# Patient Record
Sex: Female | Born: 1940 | Race: Black or African American | Hispanic: No | Smoking: Former smoker
Health system: Southern US, Community
[De-identification: ages and names within clinical notes are randomized; demographics above are authoritative.]

## PROBLEM LIST (undated history)

## (undated) DIAGNOSIS — D649 Anemia, unspecified: Secondary | ICD-10-CM

## (undated) DIAGNOSIS — I10 Essential (primary) hypertension: Secondary | ICD-10-CM

## (undated) DIAGNOSIS — Z22322 Carrier or suspected carrier of Methicillin resistant Staphylococcus aureus: Secondary | ICD-10-CM

## (undated) DIAGNOSIS — G459 Transient cerebral ischemic attack, unspecified: Secondary | ICD-10-CM

## (undated) DIAGNOSIS — I5032 Chronic diastolic (congestive) heart failure: Secondary | ICD-10-CM

## (undated) DIAGNOSIS — N10 Acute tubulo-interstitial nephritis: Secondary | ICD-10-CM

## (undated) DIAGNOSIS — M86671 Other chronic osteomyelitis, right ankle and foot: Secondary | ICD-10-CM

## (undated) DIAGNOSIS — K5900 Constipation, unspecified: Secondary | ICD-10-CM

## (undated) DIAGNOSIS — N289 Disorder of kidney and ureter, unspecified: Secondary | ICD-10-CM

## (undated) DIAGNOSIS — N184 Chronic kidney disease, stage 4 (severe): Secondary | ICD-10-CM

## (undated) DIAGNOSIS — B370 Candidal stomatitis: Secondary | ICD-10-CM

## (undated) DIAGNOSIS — H547 Unspecified visual loss: Secondary | ICD-10-CM

## (undated) DIAGNOSIS — J189 Pneumonia, unspecified organism: Secondary | ICD-10-CM

## (undated) DIAGNOSIS — Z9089 Acquired absence of other organs: Secondary | ICD-10-CM

## (undated) DIAGNOSIS — R55 Syncope and collapse: Secondary | ICD-10-CM

## (undated) DIAGNOSIS — K219 Gastro-esophageal reflux disease without esophagitis: Secondary | ICD-10-CM

## (undated) HISTORY — DX: Syncope and collapse: R55

## (undated) HISTORY — DX: Acquired absence of other organs: Z90.89

## (undated) HISTORY — PX: ESOPHAGEAL DILATION: SHX303

## (undated) HISTORY — PX: GALLBLADDER SURGERY: SHX652

## (undated) HISTORY — PX: CHOLECYSTECTOMY: SHX55

## (undated) SURGERY — AMPUTATION, FOOT, RAY
Anesthesia: General | Laterality: Right

---

## 1997-08-19 ENCOUNTER — Emergency Department (HOSPITAL_COMMUNITY): Admission: EM | Admit: 1997-08-19 | Discharge: 1997-08-19 | Payer: Self-pay | Admitting: Emergency Medicine

## 1998-01-26 ENCOUNTER — Emergency Department (HOSPITAL_COMMUNITY): Admission: EM | Admit: 1998-01-26 | Discharge: 1998-01-27 | Payer: Self-pay | Admitting: Internal Medicine

## 1998-01-27 ENCOUNTER — Encounter: Payer: Self-pay | Admitting: Internal Medicine

## 1998-09-25 ENCOUNTER — Emergency Department (HOSPITAL_COMMUNITY): Admission: EM | Admit: 1998-09-25 | Discharge: 1998-09-25 | Payer: Self-pay

## 1998-09-28 ENCOUNTER — Encounter: Payer: Self-pay | Admitting: Emergency Medicine

## 1998-09-28 ENCOUNTER — Emergency Department (HOSPITAL_COMMUNITY): Admission: EM | Admit: 1998-09-28 | Discharge: 1998-09-28 | Payer: Self-pay | Admitting: Emergency Medicine

## 1998-09-30 ENCOUNTER — Encounter (HOSPITAL_BASED_OUTPATIENT_CLINIC_OR_DEPARTMENT_OTHER): Payer: Self-pay | Admitting: General Surgery

## 1998-10-04 ENCOUNTER — Encounter (HOSPITAL_BASED_OUTPATIENT_CLINIC_OR_DEPARTMENT_OTHER): Payer: Self-pay | Admitting: General Surgery

## 1998-10-05 ENCOUNTER — Inpatient Hospital Stay (HOSPITAL_COMMUNITY): Admission: RE | Admit: 1998-10-05 | Discharge: 1998-10-06 | Payer: Self-pay | Admitting: General Surgery

## 1998-10-11 ENCOUNTER — Encounter: Admission: RE | Admit: 1998-10-11 | Discharge: 1999-01-09 | Payer: Self-pay | Admitting: General Surgery

## 1999-02-10 ENCOUNTER — Encounter (INDEPENDENT_AMBULATORY_CARE_PROVIDER_SITE_OTHER): Payer: Self-pay

## 1999-02-10 ENCOUNTER — Encounter: Payer: Self-pay | Admitting: Emergency Medicine

## 1999-02-10 ENCOUNTER — Inpatient Hospital Stay (HOSPITAL_COMMUNITY): Admission: EM | Admit: 1999-02-10 | Discharge: 1999-02-15 | Payer: Self-pay | Admitting: Emergency Medicine

## 1999-03-21 ENCOUNTER — Ambulatory Visit (HOSPITAL_COMMUNITY): Admission: RE | Admit: 1999-03-21 | Discharge: 1999-03-21 | Payer: Self-pay | Admitting: Gastroenterology

## 1999-03-21 ENCOUNTER — Encounter: Payer: Self-pay | Admitting: Gastroenterology

## 2000-12-21 ENCOUNTER — Emergency Department (HOSPITAL_COMMUNITY): Admission: EM | Admit: 2000-12-21 | Discharge: 2000-12-21 | Payer: Self-pay | Admitting: Emergency Medicine

## 2000-12-23 ENCOUNTER — Ambulatory Visit (HOSPITAL_COMMUNITY): Admission: RE | Admit: 2000-12-23 | Discharge: 2000-12-24 | Payer: Self-pay | Admitting: Ophthalmology

## 2000-12-23 ENCOUNTER — Encounter: Payer: Self-pay | Admitting: Ophthalmology

## 2001-04-09 ENCOUNTER — Encounter: Payer: Self-pay | Admitting: Ophthalmology

## 2001-04-09 ENCOUNTER — Ambulatory Visit (HOSPITAL_COMMUNITY): Admission: RE | Admit: 2001-04-09 | Discharge: 2001-04-10 | Payer: Self-pay | Admitting: Ophthalmology

## 2001-05-19 ENCOUNTER — Ambulatory Visit (HOSPITAL_COMMUNITY): Admission: RE | Admit: 2001-05-19 | Discharge: 2001-05-20 | Payer: Self-pay | Admitting: Ophthalmology

## 2001-09-25 ENCOUNTER — Emergency Department (HOSPITAL_COMMUNITY): Admission: EM | Admit: 2001-09-25 | Discharge: 2001-09-25 | Payer: Self-pay | Admitting: Emergency Medicine

## 2001-09-25 ENCOUNTER — Encounter: Payer: Self-pay | Admitting: Emergency Medicine

## 2003-08-11 ENCOUNTER — Inpatient Hospital Stay (HOSPITAL_COMMUNITY): Admission: EM | Admit: 2003-08-11 | Discharge: 2003-08-14 | Payer: Self-pay | Admitting: *Deleted

## 2003-08-12 ENCOUNTER — Encounter: Payer: Self-pay | Admitting: Internal Medicine

## 2003-09-07 ENCOUNTER — Emergency Department (HOSPITAL_COMMUNITY): Admission: EM | Admit: 2003-09-07 | Discharge: 2003-09-07 | Payer: Self-pay | Admitting: *Deleted

## 2004-01-12 ENCOUNTER — Ambulatory Visit: Payer: Self-pay | Admitting: Family Medicine

## 2004-03-28 ENCOUNTER — Ambulatory Visit: Payer: Self-pay | Admitting: *Deleted

## 2004-03-28 ENCOUNTER — Ambulatory Visit: Payer: Self-pay | Admitting: Family Medicine

## 2006-12-16 ENCOUNTER — Emergency Department (HOSPITAL_COMMUNITY): Admission: EM | Admit: 2006-12-16 | Discharge: 2006-12-16 | Payer: Self-pay | Admitting: Emergency Medicine

## 2007-01-16 ENCOUNTER — Ambulatory Visit: Payer: Self-pay | Admitting: Family Medicine

## 2007-05-12 ENCOUNTER — Ambulatory Visit: Payer: Self-pay | Admitting: Family Medicine

## 2007-05-12 LAB — CONVERTED CEMR LAB
ALT: 10 units/L (ref 0–35)
Albumin: 4.1 g/dL (ref 3.5–5.2)
Basophils Absolute: 0 10*3/uL (ref 0.0–0.1)
Chloride: 101 meq/L (ref 96–112)
Creatinine, Ser: 1.04 mg/dL (ref 0.40–1.20)
Eosinophils Absolute: 0.2 10*3/uL (ref 0.0–0.7)
Free T4: 1.46 ng/dL (ref 0.89–1.80)
Glucose, Bld: 277 mg/dL — ABNORMAL HIGH (ref 70–99)
HCT: 41.9 % (ref 36.0–46.0)
HDL: 53 mg/dL (ref >39)
Hemoglobin: 13.4 g/dL (ref 12.0–15.0)
LDL Cholesterol: 215 mg/dL — ABNORMAL HIGH (ref 0–99)
Lymphs Abs: 1.7 10*3/uL (ref 0.7–4.0)
MCV: 88.2 fL (ref 78.0–100.0)
Monocytes Relative: 3 % (ref 3–12)
Neutro Abs: 7.2 10*3/uL (ref 1.7–7.7)
Potassium: 4.6 meq/L (ref 3.5–5.3)
RBC: 4.75 M/uL (ref 3.87–5.11)
RDW: 14.6 % (ref 11.5–15.5)
Total Bilirubin: 0.3 mg/dL (ref 0.3–1.2)
Total Protein: 8 g/dL (ref 6.0–8.3)
VLDL: 27 mg/dL (ref 0–40)

## 2007-05-13 ENCOUNTER — Encounter (INDEPENDENT_AMBULATORY_CARE_PROVIDER_SITE_OTHER): Payer: Self-pay | Admitting: Family Medicine

## 2007-06-03 ENCOUNTER — Ambulatory Visit: Payer: Self-pay | Admitting: Family Medicine

## 2007-06-18 ENCOUNTER — Ambulatory Visit: Payer: Self-pay | Admitting: Internal Medicine

## 2007-07-01 ENCOUNTER — Inpatient Hospital Stay (HOSPITAL_COMMUNITY): Admission: EM | Admit: 2007-07-01 | Discharge: 2007-07-05 | Payer: Self-pay | Admitting: Emergency Medicine

## 2007-08-14 ENCOUNTER — Ambulatory Visit: Payer: Self-pay | Admitting: Family Medicine

## 2007-08-14 LAB — CONVERTED CEMR LAB: Microalb, Ur: 5.44 mg/dL — ABNORMAL HIGH (ref 0.00–1.89)

## 2007-12-19 ENCOUNTER — Ambulatory Visit: Payer: Self-pay | Admitting: Internal Medicine

## 2008-11-04 ENCOUNTER — Ambulatory Visit: Payer: Self-pay | Admitting: Family Medicine

## 2008-11-04 LAB — CONVERTED CEMR LAB
CO2: 24 meq/L (ref 19–32)
Calcium: 9.4 mg/dL (ref 8.4–10.5)
Chloride: 103 meq/L (ref 96–112)
LDL Cholesterol: 185 mg/dL — ABNORMAL HIGH (ref 0–99)
Potassium: 4.7 meq/L (ref 3.5–5.3)
Sodium: 141 meq/L (ref 135–145)
Total CHOL/HDL Ratio: 4.7
VLDL: 21 mg/dL (ref 0–40)
Vit D, 25-Hydroxy: 9 ng/mL — ABNORMAL LOW (ref 30–89)

## 2008-11-05 ENCOUNTER — Encounter (INDEPENDENT_AMBULATORY_CARE_PROVIDER_SITE_OTHER): Payer: Self-pay | Admitting: Family Medicine

## 2008-12-25 ENCOUNTER — Encounter (INDEPENDENT_AMBULATORY_CARE_PROVIDER_SITE_OTHER): Payer: Self-pay | Admitting: Internal Medicine

## 2008-12-25 ENCOUNTER — Inpatient Hospital Stay (HOSPITAL_COMMUNITY): Admission: EM | Admit: 2008-12-25 | Discharge: 2008-12-26 | Payer: Self-pay | Admitting: Emergency Medicine

## 2009-03-06 ENCOUNTER — Emergency Department (HOSPITAL_COMMUNITY): Admission: EM | Admit: 2009-03-06 | Discharge: 2009-03-06 | Payer: Self-pay | Admitting: Emergency Medicine

## 2009-03-10 ENCOUNTER — Ambulatory Visit: Payer: Self-pay | Admitting: Family Medicine

## 2009-05-09 ENCOUNTER — Emergency Department (HOSPITAL_COMMUNITY): Admission: EM | Admit: 2009-05-09 | Discharge: 2009-05-09 | Payer: Self-pay | Admitting: Emergency Medicine

## 2009-05-17 ENCOUNTER — Observation Stay (HOSPITAL_COMMUNITY): Admission: EM | Admit: 2009-05-17 | Discharge: 2009-05-19 | Payer: Self-pay | Admitting: Emergency Medicine

## 2009-05-17 ENCOUNTER — Encounter (INDEPENDENT_AMBULATORY_CARE_PROVIDER_SITE_OTHER): Payer: Self-pay | Admitting: Internal Medicine

## 2009-05-17 ENCOUNTER — Ambulatory Visit: Payer: Self-pay | Admitting: Internal Medicine

## 2009-05-17 ENCOUNTER — Ambulatory Visit: Payer: Self-pay | Admitting: Cardiology

## 2009-05-18 ENCOUNTER — Encounter: Payer: Self-pay | Admitting: Internal Medicine

## 2009-05-18 ENCOUNTER — Ambulatory Visit: Payer: Self-pay | Admitting: Vascular Surgery

## 2009-05-19 ENCOUNTER — Encounter: Payer: Self-pay | Admitting: Internal Medicine

## 2009-05-19 DIAGNOSIS — I452 Bifascicular block: Secondary | ICD-10-CM

## 2009-05-19 DIAGNOSIS — R55 Syncope and collapse: Secondary | ICD-10-CM

## 2009-05-19 DIAGNOSIS — Z9089 Acquired absence of other organs: Secondary | ICD-10-CM | POA: Insufficient documentation

## 2009-05-19 DIAGNOSIS — I6350 Cerebral infarction due to unspecified occlusion or stenosis of unspecified cerebral artery: Secondary | ICD-10-CM | POA: Insufficient documentation

## 2009-05-19 DIAGNOSIS — I10 Essential (primary) hypertension: Secondary | ICD-10-CM

## 2009-05-19 DIAGNOSIS — H547 Unspecified visual loss: Secondary | ICD-10-CM | POA: Insufficient documentation

## 2009-05-19 DIAGNOSIS — K220 Achalasia of cardia: Secondary | ICD-10-CM

## 2009-05-19 DIAGNOSIS — E1151 Type 2 diabetes mellitus with diabetic peripheral angiopathy without gangrene: Secondary | ICD-10-CM

## 2009-05-19 DIAGNOSIS — N3289 Other specified disorders of bladder: Secondary | ICD-10-CM | POA: Insufficient documentation

## 2009-05-19 HISTORY — DX: Acquired absence of other organs: Z90.89

## 2009-05-19 HISTORY — DX: Syncope and collapse: R55

## 2009-05-20 ENCOUNTER — Encounter: Payer: Self-pay | Admitting: Internal Medicine

## 2009-06-16 ENCOUNTER — Ambulatory Visit: Payer: Self-pay | Admitting: Family Medicine

## 2010-02-28 NOTE — Discharge Summary (Signed)
Summary: Hospital Discharge Update (syncope)    Hospital Discharge Update:  Date of Admission: 05/17/2009 Date of Discharge: 05/19/2009  Brief Summary:  Syncope- cause undetermined. 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 Follow up on that,  Problem list changes:  Added new problem of SYNCOPE (ICD-780.2) Added new problem of DM (ICD-250.00) Added new problem of BLINDNESS, BILATERAL (ICD-369.00) Added new problem of ESSENTIAL HYPERTENSION (ICD-401.9) Added new problem of LACUNAR INFARCTION (ICD-434.91) Added new problem of ACHALASIA (ICD-530.0) Added new problem of CHOLECYSTECTOMY, HX OF (ICD-V45.79) Added new problem of IRRITABLE BLADDER (ICD-596.8) Added new problem of OTHER BILATERAL BUNDLE BRANCH BLOCK (ICD-426.53)  Medication list changes:  Added new medication of LISINOPRIL 10 MG TABS (LISINOPRIL) Take 1 tablet by mouth once a day Added new medication of GLIPIZIDE 10 MG TABS (GLIPIZIDE) Take 1 tablet by mouth once a day Added new medication of SIMVASTATIN 40 MG TABS (SIMVASTATIN) 1 tab by mouth at bedtime Added new medication of ENABLEX 7.5 MG XR24H-TAB (DARIFENACIN HYDROBROMIDE) Take 1 tablet by mouth once a day Added new medication of VITAMIN D (ERGOCALCIFEROL) 50000 UNIT CAPS (ERGOCALCIFEROL) 1 tab on Mondays  The medication, problem, and allergy lists have been updated.  Please see the dictated discharge summary for details.  Discharge medications:  LISINOPRIL 10 MG TABS (LISINOPRIL) Take 1 tablet by mouth once a day GLIPIZIDE 10 MG TABS (GLIPIZIDE) Take 1 tablet by mouth once a day SIMVASTATIN 40 MG TABS (SIMVASTATIN) 1  tab by mouth at bedtime ENABLEX 7.5 MG XR24H-TAB (DARIFENACIN HYDROBROMIDE) Take 1 tablet by mouth once a day VITAMIN D (ERGOCALCIFEROL) 50000 UNIT CAPS (ERGOCALCIFEROL) 1 tab on Mondays  Other patient instructions:  PICK UP YOUR HEART MONITOR FROM THE CARDIOLOGIST's OFFICE AFTER 2 WEEKS Their address is [address], [address], [place], [place] and phone number [phone] Stop taking hydrochlorthiazide Dose of lisinopril is reduced and zocor is increased. Refill new prescriptions from pharmacy Schedule follow up appointment with [facility] in 2 weeks Come to the ED if you are light headed or have a fall    Note: Hospital Discharge Medications & Other Instructions handout was printed, one copy for patient and a second copy to be placed in hospital chart.

## 2010-02-28 NOTE — Miscellaneous (Signed)
Summary: adding aspirin to med list  Clinical Lists Changes  Medications: Added new medication of ASPIRIN 325 MG TABS (ASPIRIN) Take 1 tablet by mouth once a day

## 2010-02-28 NOTE — Assessment & Plan Note (Signed)
Summary: Hospital Admission H&P  INTERNAL MEDICINE TEACHING SERVICE ADMISSION HISTORY & PHYSICAL   Attending: Dr. Tilford Pillar First contact: Dr. Scot Dock [phone]  Second contact: Dr. Comer Locket [phone] [facility], after-hours: [phone], [phone])     PCP: [facility]   CC: Fall   HPI: [name] is a 70 yo woman with DM, complicated by retinopathy and legally blind, lives in ALF and uses cane for mobility. She was walking from her bed to the bathroom this morning when she fell down right outside the bathroom door. She denies having tripped over, denies any premonitory aura, chest pain, palpitations. Denies seizures or loss of consciousness. She says she just fell down and hurt her R LE. Apart from pain in her R LE she denies any other weakness, numbness, headaches, speech/swallowing difficulty. No fever, cough, dysuria/ NVD or any other systemic complaints.   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 daily.  Hydrochlorothiazide 25 mg p.o. daily.  Lisinopril 20 mg p.o. daily.  Darifenacin 7.5 mg 1 p.o. daily.  Vitamin D 50,000 units taken on Mondays.  Simvastatin 20 mg p.o. q.h.s.  Detrol LA 2 mg p.o. daily.     Social History: Negative for alcohol or tobacco use.  She lives in ALF. Walks with a cane.  Family History: Non-contributory  Review of System: Off and on urinary complaints. Recently seen for R flank pain, which she still has. But denies any urinary complaints. Rest per HPI.   Vital Signs: BP-142/57 HR-78   RR-20   Temp-97.5   Sat-98%/ra  Orthostatic Vitals: Lying: 156/63   74 Sitting: 174/68   78 Standing: 167/81   82  Physical Exam: GEN- Not in distress HEENT- NCAT, PERRL, no icterus/pallor LUNGS- clear to auscultation.   CVS- RRR, no murmur ABD- soft, non-tender, normal bowel  sounds.   EXT- no cyanosis, clubbing or edema.  NEURO- A&Ox3. CN intact except for CN II (blind). Normal motor strength, normal sensation.  PSY- appropriate.    LAB RESULTS  EKG: RBBB (old), New T wave changes in lateral leads.   TCO2                                     26                0-100              Ionized Calcium                          1.07       l      1.12-1.32          Hemoglobin (HGB)                         13.9              12.0-15.0          Hematocrit (HCT)                         41.0              36.0-46.0        %  Sodium (NA)                              139               135-145            Potassium (K)                            4.4               3.5-5.1            Chloride                                 106               96-112             Glucose                                  197        h      70-99              BUN                                      19                6-23               Creatinine                               0.9               0.4-1.2               WBC                                      8.6               4.0-10.5           RBC                                      4.29              3.87-5.11          Hemoglobin (HGB)                         12.4              12.0-15.0          Hematocrit (HCT)                         37.7              36.0-46.0        %  MCV  87.8              78.0-100.0         MCHC                                     33.1              30.0-36.0          RDW                                      14.9              11.5-15.5        %  Platelet Count (PLT)                     216               150-400            Neutrophils, %                           78         h      43-77            %  Lymphocytes, %                           16                12-46            %  Monocytes, %                             2          l      3-12             %  Eosinophils, %                            4                 0-5              %  Basophils, %                             0                 0-1              %  Neutrophils, Absolute                    6.8               1.7-7.7            Lymphocytes, Absolute                    1.4               0.7-4.0            Monocytes, Absolute  0.2               0.1-1.0            Eosinophils, Absolute                    0.3               0.0-0.7            Basophils, Absolute                      0.0               0.0-0.1             Sodium (NA)                              139               135-145            Potassium (K)                            4.6               3.5-5.1            Chloride                                 106               96-112             CO2                                      26                19-32              Glucose                                  189        h      70-99              BUN                                      18                6-23               Creatinine                               0.95              0.4-1.2            GFR, Est Non African American            59         l      >60  GFR, Est African American                >60               >60                  Oversized comment, see footnote  1  Calcium                                  9.0               8.4-10.5            Color, Urine                             YELLOW            YELLOW  Appearance                               CLEAR             CLEAR  Specific Gravity                         1.015             1.005-1.030  pH                                       5.5               5.0-8.0  Urine Glucose                            NEGATIVE          NEG                Bilirubin                                NEGATIVE          NEG  Ketones                                  NEGATIVE          NEG                Blood                                    TRACE      a      NEG  Protein                                   100        a      NEG                Urobilinogen  0.2               0.0-1.0            Nitrite                                  NEGATIVE          NEG  Leukocytes                               NEGATIVE          NEG  Squamous Epithelial / LPF                FEW        a      RARE  WBC / HPF                                0-2               <3               WBC/hpf  RBC / HPF                                0-2               <3               RBC/hpf  Bacteria / HPF                           MANY       a      RARE  CKMB, POC                                1.9               1.0-8.0            Troponin I, POC                          <0.05             0.00-0.09          Myoglobin, POC                           92.6              12-200             ASSESSMENT & PLAN  This is a 70 yo woman admitted after an episode of fall without loss of consciousness. The differential is broad (incl postural hypo, hypoglycemia, cardiac events, sz, UTI) but given her blindness her fall might as well be related to that. Her orthostatic vitals look normal. Non focal on exam, unlikely to be CVA, won't get a CT head at this time. Although suspicious of UTI, her urine exam is not convincing. She does have significant risk factors for cardiac event, and given EKG changes we will admit her to rule out ACS.   ATTENDING PHYSICIAN: I performed and/or observed a history  and physical examination of the patient.  I discussed the case with the residents as noted and reviewed the residents' notes.  I agree with the findings and plan--please refer to the attending physician note for more details.  Signature________________________________  Printed Name_____________________________

## 2010-04-18 LAB — GLUCOSE, CAPILLARY
Glucose-Capillary: 181 mg/dL — ABNORMAL HIGH (ref 70–99)
Glucose-Capillary: 200 mg/dL — ABNORMAL HIGH (ref 70–99)
Glucose-Capillary: 237 mg/dL — ABNORMAL HIGH (ref 70–99)
Glucose-Capillary: 264 mg/dL — ABNORMAL HIGH (ref 70–99)

## 2010-04-18 LAB — URINALYSIS, ROUTINE W REFLEX MICROSCOPIC
Leukocytes, UA: NEGATIVE
Specific Gravity, Urine: 1.015 (ref 1.005–1.030)
Urobilinogen, UA: 0.2 mg/dL (ref 0.0–1.0)
pH: 5.5 (ref 5.0–8.0)

## 2010-04-18 LAB — POCT I-STAT, CHEM 8
BUN: 19 mg/dL (ref 6–23)
Calcium, Ion: 1.07 mmol/L — ABNORMAL LOW (ref 1.12–1.32)
Creatinine, Ser: 0.9 mg/dL (ref 0.4–1.2)
Glucose, Bld: 197 mg/dL — ABNORMAL HIGH (ref 70–99)
HCT: 41 % (ref 36.0–46.0)
Hemoglobin: 13.9 g/dL (ref 12.0–15.0)
TCO2: 26 mmol/L (ref 0–100)

## 2010-04-18 LAB — CARDIAC PANEL(CRET KIN+CKTOT+MB+TROPI)
CK, MB: 1.7 ng/mL (ref 0.3–4.0)
CK, MB: 1.8 ng/mL (ref 0.3–4.0)
Relative Index: INVALID (ref 0.0–2.5)
Relative Index: INVALID (ref 0.0–2.5)
Total CK: 32 U/L (ref 7–177)
Total CK: 39 U/L (ref 7–177)
Troponin I: 0.06 ng/mL (ref 0.00–0.06)
Troponin I: 0.09 ng/mL — ABNORMAL HIGH (ref 0.00–0.06)

## 2010-04-18 LAB — DIFFERENTIAL
Eosinophils Absolute: 0.3 10*3/uL (ref 0.0–0.7)
Eosinophils Relative: 4 % (ref 0–5)
Lymphs Abs: 1.4 10*3/uL (ref 0.7–4.0)
Monocytes Absolute: 0.2 10*3/uL (ref 0.1–1.0)
Neutro Abs: 6.8 10*3/uL (ref 1.7–7.7)
Neutrophils Relative %: 78 % — ABNORMAL HIGH (ref 43–77)

## 2010-04-18 LAB — LIPID PANEL
Cholesterol: 226 mg/dL — ABNORMAL HIGH (ref 0–200)
HDL: 47 mg/dL (ref >39)

## 2010-04-18 LAB — CBC
HCT: 37.7 % (ref 36.0–46.0)
HCT: 38.7 % (ref 36.0–46.0)
MCV: 87.8 fL (ref 78.0–100.0)
Platelets: 203 10*3/uL (ref 150–400)
Platelets: 216 10*3/uL (ref 150–400)
RBC: 4.29 MIL/uL (ref 3.87–5.11)
RDW: 14.6 % (ref 11.5–15.5)
RDW: 14.9 % (ref 11.5–15.5)
WBC: 7.4 10*3/uL (ref 4.0–10.5)
WBC: 8.6 10*3/uL (ref 4.0–10.5)

## 2010-04-18 LAB — URINE MICROSCOPIC-ADD ON

## 2010-04-18 LAB — BASIC METABOLIC PANEL
BUN: 18 mg/dL (ref 6–23)
GFR calc Af Amer: >60 mL/min (ref >60)
GFR calc non Af Amer: 59 mL/min — ABNORMAL LOW (ref >60)
Potassium: 4.6 mEq/L (ref 3.5–5.1)

## 2010-04-18 LAB — CK TOTAL AND CKMB (NOT AT ARMC)
CK, MB: 1.9 ng/mL (ref 0.3–4.0)
Relative Index: INVALID (ref 0.0–2.5)
Total CK: 37 U/L (ref 7–177)

## 2010-04-18 LAB — URINE CULTURE: Colony Count: 30000

## 2010-04-18 LAB — TROPONIN I: Troponin I: 0.05 ng/mL (ref 0.00–0.06)

## 2010-04-18 LAB — POCT CARDIAC MARKERS: Myoglobin, poc: 92.6 ng/mL (ref 12–200)

## 2010-04-19 LAB — CBC
Hemoglobin: 12.7 g/dL (ref 12.0–15.0)
MCHC: 32.2 g/dL (ref 30.0–36.0)
MCV: 88.2 fL (ref 78.0–100.0)
RBC: 4.48 MIL/uL (ref 3.87–5.11)
RDW: 14.5 % (ref 11.5–15.5)

## 2010-04-19 LAB — URINALYSIS, ROUTINE W REFLEX MICROSCOPIC
Glucose, UA: 250 mg/dL — AB
Ketones, ur: NEGATIVE mg/dL
Leukocytes, UA: NEGATIVE
Protein, ur: 30 mg/dL — AB
Urobilinogen, UA: 0.2 mg/dL (ref 0.0–1.0)

## 2010-04-19 LAB — BASIC METABOLIC PANEL
CO2: 30 mEq/L (ref 19–32)
Chloride: 102 mEq/L (ref 96–112)
Creatinine, Ser: 0.96 mg/dL (ref 0.4–1.2)
GFR calc Af Amer: >60 mL/min (ref >60)
Glucose, Bld: 319 mg/dL — ABNORMAL HIGH (ref 70–99)
Sodium: 138 mEq/L (ref 135–145)

## 2010-04-19 LAB — DIFFERENTIAL
Basophils Relative: 0 % (ref 0–1)
Eosinophils Absolute: 0.1 10*3/uL (ref 0.0–0.7)
Eosinophils Relative: 2 % (ref 0–5)
Monocytes Absolute: 0.3 10*3/uL (ref 0.1–1.0)
Monocytes Relative: 4 % (ref 3–12)

## 2010-04-21 LAB — COMPREHENSIVE METABOLIC PANEL
ALT: 28 U/L (ref 0–35)
Alkaline Phosphatase: 72 U/L (ref 39–117)
Glucose, Bld: 234 mg/dL — ABNORMAL HIGH (ref 70–99)
Potassium: 4.8 mEq/L (ref 3.5–5.1)
Sodium: 130 mEq/L — ABNORMAL LOW (ref 135–145)
Total Protein: 7.5 g/dL (ref 6.0–8.3)

## 2010-04-21 LAB — CBC
Hemoglobin: 13.2 g/dL (ref 12.0–15.0)
RBC: 4.63 MIL/uL (ref 3.87–5.11)
RDW: 15 % (ref 11.5–15.5)
WBC: 11.5 10*3/uL — ABNORMAL HIGH (ref 4.0–10.5)

## 2010-04-21 LAB — URINALYSIS, ROUTINE W REFLEX MICROSCOPIC
Bilirubin Urine: NEGATIVE
Glucose, UA: NEGATIVE mg/dL
Hgb urine dipstick: NEGATIVE
Ketones, ur: NEGATIVE mg/dL
Protein, ur: NEGATIVE mg/dL

## 2010-04-21 LAB — DIFFERENTIAL
Basophils Relative: 0 % (ref 0–1)
Eosinophils Absolute: 0 10*3/uL (ref 0.0–0.7)
Monocytes Absolute: 0.2 10*3/uL (ref 0.1–1.0)
Monocytes Relative: 2 % — ABNORMAL LOW (ref 3–12)
Neutrophils Relative %: 93 % — ABNORMAL HIGH (ref 43–77)

## 2010-04-21 LAB — URINE CULTURE: Culture: NO GROWTH

## 2010-05-03 LAB — URINE CULTURE
Colony Count: NO GROWTH
Colony Count: NO GROWTH
Culture: NO GROWTH
Culture: NO GROWTH

## 2010-05-03 LAB — DIFFERENTIAL
Basophils Relative: 0 % (ref 0–1)
Basophils Relative: 0 % (ref 0–1)
Eosinophils Absolute: 0.3 10*3/uL (ref 0.0–0.7)
Eosinophils Relative: 3 % (ref 0–5)
Lymphocytes Relative: 14 % (ref 12–46)
Monocytes Absolute: 0.4 10*3/uL (ref 0.1–1.0)
Monocytes Relative: 2 % — ABNORMAL LOW (ref 3–12)
Monocytes Relative: 5 % (ref 3–12)
Neutro Abs: 5.7 10*3/uL (ref 1.7–7.7)
Neutrophils Relative %: 68 % (ref 43–77)
Neutrophils Relative %: 82 % — ABNORMAL HIGH (ref 43–77)

## 2010-05-03 LAB — POCT I-STAT, CHEM 8
BUN: 29 mg/dL — ABNORMAL HIGH (ref 6–23)
Hemoglobin: 15.3 g/dL — ABNORMAL HIGH (ref 12.0–15.0)
Sodium: 139 mEq/L (ref 135–145)
TCO2: 24 mmol/L (ref 0–100)

## 2010-05-03 LAB — COMPREHENSIVE METABOLIC PANEL
ALT: 23 U/L (ref 0–35)
AST: 20 U/L (ref 0–37)
Albumin: 3 g/dL — ABNORMAL LOW (ref 3.5–5.2)
Alkaline Phosphatase: 50 U/L (ref 39–117)
GFR calc Af Amer: 42 mL/min — ABNORMAL LOW (ref >60)
Glucose, Bld: 134 mg/dL — ABNORMAL HIGH (ref 70–99)
Potassium: 4.8 mEq/L (ref 3.5–5.1)
Sodium: 137 mEq/L (ref 135–145)
Total Protein: 6.5 g/dL (ref 6.0–8.3)

## 2010-05-03 LAB — CBC
HCT: 40.2 % (ref 36.0–46.0)
Hemoglobin: 12.3 g/dL (ref 12.0–15.0)
MCHC: 33.2 g/dL (ref 30.0–36.0)
MCV: 86 fL (ref 78.0–100.0)
Platelets: 207 10*3/uL (ref 150–400)
RBC: 4.3 MIL/uL (ref 3.87–5.11)
RDW: 14.4 % (ref 11.5–15.5)
RDW: 14.5 % (ref 11.5–15.5)

## 2010-05-03 LAB — URINALYSIS, ROUTINE W REFLEX MICROSCOPIC
Glucose, UA: 250 mg/dL — AB
Hgb urine dipstick: NEGATIVE
Ketones, ur: NEGATIVE mg/dL
Protein, ur: 100 mg/dL — AB
Protein, ur: NEGATIVE mg/dL
Urobilinogen, UA: 0.2 mg/dL (ref 0.0–1.0)

## 2010-05-03 LAB — GLUCOSE, CAPILLARY
Glucose-Capillary: 276 mg/dL — ABNORMAL HIGH (ref 70–99)
Glucose-Capillary: 327 mg/dL — ABNORMAL HIGH (ref 70–99)

## 2010-05-03 LAB — URINE MICROSCOPIC-ADD ON

## 2010-05-03 LAB — POCT CARDIAC MARKERS: Myoglobin, poc: 112 ng/mL (ref 12–200)

## 2010-05-03 LAB — HEMOGLOBIN A1C: Mean Plasma Glucose: 220 mg/dL

## 2010-06-13 NOTE — H&P (Signed)
[name], [name] NO.:  [id number]   MEDICAL RECORD NO.:  [id number]          PATIENT TYPE:  INP   LOCATION:  3308                         FACILITY:  [facility]   PHYSICIAN:  [name], M.D. DATE OF BIRTH:  August 24, 1940   DATE OF ADMISSION:  07/01/2007  DATE OF DISCHARGE:                              HISTORY & PHYSICAL   PRIMARY CARE PHYSICIAN:  [facility]   CHIEF COMPLAINT:  Rectal bleeding.   HISTORY OF PRESENT ILLNESS:  This is a 70 year old female presenting to  the emergency department secondary to complaints of rectal bleeding,  weakness and dizziness.  The patient reports the symptoms began at about  5:00 p.m.  She reports laying down at the time and beginning to pass  liquid from her rectum that she thought was stool.  The patient reports  going to the bathroom and reports remaining on the toilet continuing to  pass liquid.  The patient is legally blind and could not see the  substance.  She reports a friend came over and told her that she was  passing reddish blood.  EMS was called and the patient was transported  to the emergency department for evaluation.   The patient does report having weakness, dizziness and lightheadedness,  and denies having headache, fevers, chills or shortness of breath, chest  pain, or syncope.  She also denies having current nausea or vomiting or  diarrhea, but reports having nausea and vomiting 3 days ago.   PAST MEDICAL HISTORY:  1. Type 2 diabetes mellitus.  2. Diabetic retinopathy.  3. Cataracts.  4. Legal blindness.  5. Hypertension,   PAST SURGICAL HISTORY:  The patient reports having a cholecystectomy in  the past, along with esophageal dilatation..   CURRENT MEDICATIONS:  1. Lisinopril.  2. Hydrochlorothiazide 20/12.5 one p.o. daily.  3. Detrol 2 mg one p.o. daily.  4. Glipizide 10 mg one p.o. b.i.d.   ALLERGIES:  NO KNOWN DRUG ALLERGIES.   SOCIAL HISTORY:  Lives alone.  Nonsmoker, nondrinker.   FAMILY HISTORY:  Noncontributory.   PHYSICAL EXAMINATION FINDINGS:  This is an obese 70 year old female in  mild discomfort but no acute distress.  VITAL SIGNS:  Initially were temperature 98.0, blood pressure 120/69,  heart rate 81, respirations 16, O2 saturations 97-100%.  HEENT:  Normocephalic, atraumatic.  Pupils are reactive to light  bilaterally.  Extraocular movements are intact.  The patient has  nystagmus of the left eye.  Oropharynx is clear.  NECK:  Supple, full range of motion.  No thyromegaly, adenopathy, or  jugular venous distention.  CARDIOVASCULAR:  Regular rate and rhythm.  No murmurs, gallops or rubs.  LUNGS:  Clear to auscultation bilaterally.  ABDOMEN:  Positive bowel sounds; soft, nontender, nondistended.  EXTREMITIES:  Without cyanosis, clubbing or edema.  NEUROLOGIC:  Nonfocal except for her visual deficits.   LABORATORY STUDIES:  White blood cell count 12.3, hemoglobin 12.8,  hematocrit 37.7, platelets 286.  Acute Abdominal Series:  The chest x-  ray portion reveals no acute disease process.  Abdominal series is  negative for any free air or obstruction.  ASSESSMENT:  A 70 year old female being admitted with:  1. Rectal bleeding.  2. Type 2 diabetes mellitus.  3. Hypotension.   PLAN:  The patient will be admitted to a step-down unit area.  IV fluids  have been ordered for fluid resuscitation and maintenance.  The patient  will be placed on clear liquids for now.  Serial H&Hs will also be  checked q.8 h. times 48 hours.  Two units of packed red blood cells have  been ordered and placed on hold for now, in the event that the patient  may need to be transfused.  The patient will be placed on TED hose and  IV Protonix therapy.  Sliding-scale insulin coverage has been ordered,  and her blood pressure medication has been held for now secondary to her  transient hypotension.  GI has been consulted and will see the patient  in the a.m. I spoke with the  gastroenterologist on call, Dr. Jeani Hawking.      [name], M.D.  Electronically Signed     HJ/MEDQ  D:  07/02/2007  T:  07/02/2007  Job:  [id number]

## 2010-06-13 NOTE — Consult Note (Signed)
[name], [name] NO.:  [id number]   MEDICAL RECORD NO.:  [id number]          PATIENT TYPE:  INP   LOCATION:  4738                         FACILITY:  [facility]   PHYSICIAN:  Jordan Hawks. Elnoria Howard, MD    DATE OF BIRTH:  [birth year]   DATE OF CONSULTATION:  07/02/2007  DATE OF DISCHARGE:                                 CONSULTATION   PRIMARY CARE PHYSICIAN:  [facility].   REASON FOR CONSULTATION:  Hematochezia.   HISTORY OF PRESENT ILLNESS:  This is a 70 year old female with a past  medical history of diabetic retinopathy, cataracts, legal blindness,  hypertension, and type 2 diabetes who was admitted to the hospital with  complaints of weakness and hematochezia.  The patient states that her  symptoms started acutely yesterday.  She felt different at one point and  subsequently went to the bathroom and at that time she was having a  rush of bowel movements.  At that time, her friend had come to visit  and stated that what she was passing was not bowel movement but blood.  Following this type of event, she started experiencing some lower crampy  abdominal pain.  She denies having any prior history of hematochezia in  the past and no prior colonoscopy.  There is no history of any recent  NSAID use and no family history of colon cancer.  Because of her  symptoms, she was admitted to the hospital for further evaluation and  treatment.  Upon admission, she was noted to have a low systolic in the  29B but that has subsequently resolved with IV hydration.  The patient  also complains of having dysphagia and in 2000 per her report she has  had an EGD with dilation.  She is not able to recall the exact findings  or the physician who performed the procedure.   PAST MEDICAL HISTORY/PAST SURGICAL HISTORY:  As stated above with the  addition of cholecystectomy and various eye surgeries.   HOME MEDICATIONS:  Lisinopril, hydrochlorothiazide, Detrol, and  glipizide.   ALLERGIES:   No known drug allergies.   SOCIAL HISTORY:  Negative for alcohol, tobacco, or illicit drug use.   FAMILY HISTORY:  Noncontributory.   PHYSICAL EXAMINATION:  VITAL SIGNS:  Upon admission, blood pressure is  120/69, heart rate 81, respirations 16. and temperature was 98 degrees.  GENERAL:  The patient is in no acute distress, alert and oriented.  HEENT:  Normocephalic and atraumatic.  Extraocular muscles intact.  NECK:  Supple.  No lymphadenopathy.  LUNGS:  Clear to auscultation bilaterally.  CARDIOVASCULAR:  Regular rate and rhythm.  ABDOMEN:  Obese, soft, and mild tenderness in the bilateral lower  quadrants.  No rebound or rigidity.  Positive bowel sounds.  EXTREMITIES:  No clubbing, cyanosis, or edema.  RECTAL:  Significant for some mild maroon stool.   LABORATORY VALUES:  White blood cell count is 13.5, hemoglobin 11.9, and  platelets at 264.  Sodium 134, potassium 3.9, chloride 94, CO2 of 28,  BUN is 63, creatinine 1.8, and glucose is 263.  AST is 14, ALT 15, and  alk phos 57.  Total bilirubin 0.8, albumin is 3.2.  PT is 14.5, INR 1.1,  and PTT is 25.   IMPRESSION:  1. Hematochezia.  I feel that the patient has a clinical history that      is consistent with ischemic colitis and that she had an acute onset      of hematochezia associated with lower abdominal pain.  Diverticular      bleed is a consideration.  However, I believe that this is less      likely.  She has not had a prior colonoscopy in the past and is      warranted at this time.  Additionally, the patient does complain of      having issues with dysphagia.  Since she is undergoing a      colonoscopy, I will also evaluate her for dysphagia symptoms.  It      appears to be associated more with solid foods rather than liquids,      and she does report having a dilation in the past.  This could      simply be secondary to acid reflux disease also.   PLAN:  At this time, is to perform EGD and colonoscopy with  possible  dilation of the esophagus and further recommendations pending the  findings.      Jordan Hawks Elnoria Howard, MD  Electronically Signed     PDH/MEDQ  D:  07/02/2007  T:  07/03/2007  Job:  [id number]

## 2010-06-13 NOTE — Discharge Summary (Signed)
NAMEBLAYNE, GARLICK NO.:  [id number]   MEDICAL RECORD NO.:  [id number]          PATIENT TYPE:  INP   LOCATION:  4738                         FACILITY:  [facility]   PHYSICIAN:  Isidor Holts, M.D.  DATE OF BIRTH:  May 17, 1940   DATE OF ADMISSION:  07/01/2007  DATE OF DISCHARGE:                               DISCHARGE SUMMARY   PMD:  [facility].   DISCHARGE DIAGNOSES:  1. Hematochezia, secondary to diverticulosis/internal and external      hemorrhoids.  2. Dysphagia, secondary to esophageal stricture.  3. Duodenal ulcer, confirmed by EGD July 04, 2007.  4. Type 2 diabetes mellitus.  5. Hypertension.  6. Urinary tract infection..  7. Candiduria.   DISCHARGE MEDICATIONS:  1. Glipizide 10 mg p.o. b.i.d.  2. Detrol 2 mg p.o. daily.  3. Protonix 40 mg p.o. b.i.d.  4. Sucralfate 1 gram p.o. q.i.d.  5. Levaquin 500 mg p.o. daily to be completed on July 08, 2007.  6. Diflucan 100 mg p.o. daily to be completed on July 08, 2007.   Note,:  Lisinopril - hydrochlorothiazide (20/12.4) has been  discontinued, until reevaluated by primary M.D. secondary to  hypotension.   PROCEDURES.:  1. Abdominal/chest x-ray dated July 01, 2007.  This showed no acute      cardiopulmonary or abdominal process demonstrated.  There was mild      cardiac enlargement.  2. EGD done July 04, 2007 by Dr. Jeani Hawking.  This showed evidence of      a mild strictured region in the esophagus just above the Z-line.  A      cleaned based duodenal ulcer was also found but no evidence of a      visible vessel.  3. Colonoscopy done July 04, 2007 by Dr. Elnoria Howard.  This showed      diverticula, internal and external hemorrhoids.   ADMISSION HISTORY:  As in H&P notes of July 01, 2007 dictated by Dr.  Della Goo.  However, in brief, this is a 70 year old female, with  known history of type 2 diabetes mellitus, diabetic retinopathy,  cataracts, legal blindness, hypertension, who presents with several  episodes of hematochezia without abdominal pain or hematemesis, although  she did have an episode of nausea and vomiting 3 days prior to  presentation.  She was admitted further evaluation, investigation and  management.   CLINICAL COURSE.:  1. Lower GI bleed.  For details of presentation, refer to admission      history above.  The patient on initial evaluation was found to have      a blood pressure of 120/69, heart rate was 81, hemoglobin was      12.83, hematocrit of 37.7.  She was placed under observation.      Serial Hb/hematocrit was monitored.  She was kept n.p.o.      Intravenous fluids were started.  Because of borderline low blood      pressures her antihypertensive medications were discontinued.      During the course of her hospitalization, the patient's hemoglobin      remained reasonable at about 10.0.  She was placed on parenteral      proton pump inhibitors and subsequently underwent a colonoscopy on      July 04, 2007 which showed diverticulosis as well as internal and      external hemorrhoids.  She was recommended high fiber diet and      repeat colonoscopy for routine purposes in 10 years.   1. Esophageal stricture.  The patient, during the course of her      hospitalization, complained of some difficulty swallowing.  She      underwent upper GI endoscopy on July 04, 2007, which confirmed a      mild esophageal stricture just above the Z-line.  Because of      combativeness during the procedure, it was felt too dangerous to      perform, dilatation as well as biopsies.  She has been therefore      recommended b.i.d. Protonix and Sucralfate and arrangements have      been put in place to repeat EGD as an outpatient, with Propofol.   1. Duodenal ulcer.  This was also noted during the course of the      patient's EGD as stated above, and treatment measures noted above      have been applied.   1. Type 2 diabetes mellitus.  The patient remained euglycemic during       the course of her hospitalization.   1. Hypertension.  As mentioned above, the patient's blood pressures      remained borderline during the course of her hospitalization.      Therefore, antihypertensive medications were not restarted, and as      of July 04, 2007, BP was 106/68.  We shall defer reinstatement of      her antihypertensive medication to her primary M.D. on followup, at      his own discretion.   1. Urinary tract infection.  The patient's urinalysis demonstrated a      positive sediment, consistent with urinary tract infection.  She      was placed on intravenous Rocephin and as of July 04, 2007 had      completed 3 days of this treatment.  She has therefore been      transitioned to oral Levaquin to be completed on July 08, 2007.   1. Candiduria.  The patient did have plentiful yeast in her urine.      Urine culture grew over 100,000 colonies of yeast.  She has been      placed on a 7-day course of Diflucan to be completed on July 08, 2007.   DISPOSITION:  The patient was on July 04, 2007, considered clinically  stable for discharge on July 05, 2007, provided no acute problems arise  in the interim.   DIET:  Heart-healthy/carbohydrate modified, high residue diet.   ACTIVITY:  As tolerated.   FOLLOW-UP INSTRUCTIONS:  The patient is to follow up routinely with her  primary M.D. at [facility], but certainly within 2 weeks of discharge.  She is also to follow up with Dr. Jeani Hawking, gastroenterologist, on  the date to be determined.  She has been supplied with the appropriate  information.      Isidor Holts, M.D.  Electronically Signed     CO/MEDQ  D:  07/04/2007  T:  07/04/2007  Job:  [id number]   cc:   Melvern Banker  Jordan Hawks. Elnoria Howard, MD

## 2010-06-16 NOTE — Op Note (Signed)
[place]. [facility]  Patient:    [name], [name] Visit Number: [id number] MRN: [id number]          Service Type: DSU Location: [id number] [phone] Attending Physician:  Ernesto Rutherford Dictated by:   Ernesto Rutherford, M.D. Proc. Date: 12/23/00 Admit Date:  12/23/2000   CC:         Marya Landry. Carlyle Lipa., M.D.   Operative Report  PREOPERATIVE DIAGNOSES: 1. Dense vitreous hemorrhage of the right eye. 2. 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 3. 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 4. Nuclear sclerotic cataract of the right eye.  POSTOPERATIVE DIAGNOSES: 1. Dense vitreous hemorrhage of the right eye. 2. 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 3. 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 4. Nuclear sclerotic cataract of the right eye.  PROCEDURES: 1. Posterior vitrectomy and membrane peel, OD. 2. Endolaser panretinal photocoagulation, OD. 3. Pars plana lensectomy, phacofragmentation OD. 4. Insertion of a posterior chamber intraocular lens into the sulcus    primarily.  The lens is a Storz model EZE-60, length 12.75, power +13.5,    serial number is [id number].  SURGEON:  Ernesto Rutherford, M.D.  ANESTHESIA:  General endotracheal anesthesia.  INDICATION FOR PROCEDURE:  The patient is a 70 year old woman with profound vision loss on the basis of dense vitreous hemorrhage, retinal detachment, nuclear sclerotic cataract of the right eye.  This is an attempt to reattach the retina, remove the vitreous opacification, preserve vision, and to remove the cataract, which hampers adequate removal of all vitreous.  It also is possible to repair the tractional retinal detachment to the right eye.  The patient understands the risks of anesthesia, including the rare occurrence of death, and loss to the eye, including hemorrhage, infection, scarring, need for further  surgery, no change in vision, loss of vision, and progression of disease despite intervention.  DESCRIPTION OF PROCEDURE:  After appropriate signed consent, patient taken to the operating room.  In the operating room, general endotracheal anesthesia was instituted without difficulty.  The right periocular region was sterilely prepped and draped in the usual ophthalmic fashion.  A lid speculum applied. Conjunctival peritomy fashioned temporally and superiorly.  A 4 mm infusion was secured 3.5 mm posterior to the limbus in the inferotemporal quadrant. Placement in the vitreous cavity verified visually.  Superior sclerotomy was then fashioned.  Wall microscope placed in position.  Core vitrectomy was then begun after the superior sclerotomies had been fashioned.  During the core vitrectomy, notable findings were of dense fibrovascular tissue overlying the disk, nasal to the disc, and inferior to the disc, and a combined traction and rhegmatogenous retinal detachment nasal to the optic nerve.  Initial dissection was then carried out, but the lens was too cloudy to proceed.  At this time a decision was made to perform a lensectomy.  A posterior capsule opening was then fashioned and hydrodelineation and dissection carried out in the bag.  Phacofragmentation was carried out in the bag, and then the lens was removed in an endocapsular technique.  Cortical clean-up was then carried out without difficulty.  A grooved limbal incision was then fashioned with a crescent blade.  Anterior chamber had been deepened previously with Viscoat. At this time the remainder of the posterior vitrectomy could be completed. Dense fibrovascular tissue was removed in a modified en bloc delaminated technique.  Notable findings were of retinal detachment nasal to  the optic nerve.  Vitreous skirt trimmed 360 degrees.  At this time the grooved limbal incision was then opened with an MVR blade and the Storz IOL was placed  into the sulcus, rotated to the horizontal meridian.  Placement with excellent stability was confirmed.  The interrupted 10-0 nylon sutures were then used to close the limbal wound, and the wound was found to be secure.  At this time the vitrectomy was completed, and the fluid-air exchange.  This allowed for retinal reattachment.  Endolaser photocoagulation was then carried out 360 degrees and in the bed of the detachment.  At this time the superior sclerotomy was then closed.  An air-SF6 15% exchange was completed.  The infusion was removed and subsequently closed with 7-0 Vicryl suture. Conjunctiva closed with 7-0 Vicryl suture.  Subconjunctival injection of antibiotic and steroid applied.  The patient tolerated the procedure well without complication.  The patient was awakened from anesthesia and taken to the recovery room in good and stable condition. Dictated by:   Ernesto Rutherford, M.D. Attending Physician:  Ernesto Rutherford DD:  12/23/00 TD:  12/24/00 Job: [id number]

## 2010-06-16 NOTE — Op Note (Signed)
[place]. [facility]  Patient:    [name] Visit Number: [id number] MRN: [id number]          Service Type: DSU Location: [phone] Attending Physician:  Ernesto Rutherford Dictated by:   Ernesto Rutherford, M.D. Proc. Date: 04/09/01 Admit Date:  04/09/2001 Discharge Date: 04/10/2001                             Operative Report  PREOPERATIVE DIAGNOSES: 1. Dense vitreous hemorrhage of the right eye. 2. 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 3. Rhegmatogenous retinal detachment components. 4. Dense posterior capsulotomy of the right eye.  POSTOPERATIVE DIAGNOSES: 1. Dense vitreous hemorrhage of the right eye. 2. 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 3. Rhegmatogenous retinal detachment components. 4. Dense posterior capsulotomy of the right eye.  PROCEDURES: 1. Posterior vitrectomy with membrane peel - OD of epiretinal and preretinal    tissues proliferative vitreoretinopathy. 2. Endolaser pan photocoagulation. 3. Surgical posterior capsulotomy. 4. Scleral buckle using 287 ______ silicone explant 360 degrees for vitreous    base support to the right eye. 5. Injection of vitreous substitute - permanent silicone oil 5000 centistokes    after fluid/air exchange had been completed.  SURGEON:  Ernesto Rutherford, M.D.  ANESTHESIA:  General endotracheal anesthesia.  INDICATIONS:  The patient is a 70 year old woman with profound vision loss at a level of hand motion on the basis of dense recurring vitreous hemorrhage, who in the last three weeks developed worsening of her visual acuity down to light perception.  Missed follow-up appointments were followed today by examination, which yesterdays examination, which in fact disclosed that the patient had near total retinal detachment combined tractional rhegmatogenous as noted  on ultrasonographic examination.  Dense vitreous and ______ hemorrhage was also noted.  The patient understands this is a surgical attempt to reattach the retina to provide at least ambulatory vision.  She understands the risk of anesthesia, including the rare occurrence of death, but also to the eye, including hemorrhage, infection, scarring, need for further surgery, no change in vision, loss of vision, and progression of disease despite intervention.  After appropriate signed consent was obtained, the patient was taken to the operating room.  DESCRIPTION OF PROCEDURE:  In the operating room, general endotracheal anesthesia was instituted without difficulty.  The right periocular region was sterilely prepped and draped in usual ophthalmic fashion.  A lid speculum was applied.  Conjunctival peritomies were fashioned 360 degrees approximately 3 mm posterior to the limbus.  A 4 mm infusion was secured 3.5 mm posterior to the limbus in the inferotemporal quadrant.  Placement in the vitreous cavity was verified prior to its opening of the infusion.  Superior sclerotomies were then fashioned.  Wild microscope was placed in position.  Vitreous washout was then carried out after surgical excision of the posterior capsular opening to allow visualization posteriorly.  Notable findings were dense vitreous base hemorrhage and retinal detachment inferiorly.  A thick slurry of subretinal fluid was removed through a peripheral retinal hole, which was already present.  The retinal hole was noted at the 5:30 position with just posterior to the vitreous base.  Dense proliferative epiretinal tissues were noted along the 6 oclock meridian and these were removed with forceps extraction technique.  The vitreous base was trimmed.  Scleral depression was then  used to trim the vitreous base and the dense vitreous hemorrhage in this area.  The disc was faintly viewed through the vitreous hemorrhage and was seen to  be pink.  Decision was made to place a scleral buckle 287 ______ silicone explant was selected and placed under each of the rectus muscles and tied with a 5-0 Mersilene mattress suture in each quadrant and secured end-to-end in the superotemporal quadrant with 5-0 Mersilene mattress sutures.  This helped to support the vitreous base and isolate it from the posterior pole.  It must be noted that retinal cryopexy was applied 360 degrees.  This was done in the bed of the buckle 360 degrees.  At this time, fluid/air exchange was then completed.  Posterior retinotomy was necessary and this was created with internal diathermy.  Subretinal fluid was aspirated passively.  Retina attached nicely.  Endolaser photocoagulation was then carried out 360 degrees. By this time, fluid/air exchange had been finished and completed.  An air/silicone oil exchange was carried out.  Appropriate oil fill was obtained. Superior sclerotomies were closed with 7-0 Vicryl suture.  The infusion removed and similarly closed with 7-0 Vicryl suture.  Conjunctiva was then closed with combined running and interrupted fashion 7-0 Vicryl suture. Tenons was brought forward also.  Intraocular pressure was assessed and found to be adequate.  Subconjunctival injection of antibiotics and steroid applied.  The bed of the buckle had been irrigated with BUG juice.  The patient tolerated the procedure well without complication. Dictated by:   Ernesto Rutherford, M.D. Attending Physician:  Ernesto Rutherford DD:  04/09/01 TD:  04/10/01 Job: [id number]

## 2010-06-16 NOTE — Op Note (Signed)
[place]. [facility]  Patient:    RALYNN, SAN Visit Number: [id number] MRN: [id number]          Service Type: DSU Location: [phone] Attending Physician:  Ernesto Rutherford Dictated by:   Ernesto Rutherford, M.D. Proc. Date: 05/19/01 Admit Date:  05/19/2001                             Operative Report  PREOPERATIVE DIAGNOSES: 1. 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 2. Proliferative vitreoretinopathy, stage C3. 3. Advanced, neglected progressive proliferative diabetic retinopathy of the    right eye. 4. 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  POSTOPERATIVE DIAGNOSES: 1. 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 2. Proliferative vitreoretinopathy, stage C3. 3. Advanced, neglected progressive proliferative diabetic retinopathy of the    right eye. 4. 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 5. Anterior hyaloidal fibrovascular proliferations inferiorly with fibrous    contracture of the right side and elevation inferiorly and foreshortening    of the retina.  PROCEDURES: 1. Posterior vitrectomy with membrane peel, OD, including retinectomy. 2. Focal laser photocoagulation for retinopexy. 3. Endodiathermy for retinopathy to cauterize  the area of retinectomy. 4. Removal of silicone oil from the vitreous cavity and anterior chamber, OD. 5. Retinectomy inferiorly. 6. Injection of vitreous substitute, SF6 20%, OD.  SURGEON:  Ernesto Rutherford, M.D.  ANESTHESIA:  General endotracheal anesthesia.  INDICATION FOR PROCEDURE:  The patient is a 70 year old woman who initially had vitrectomy and Endolaser photocoagulation for combined traction detachment, proliferative diabetic retinopathy, and dense vitreous hemorrhage. The patient did extremely well in the early postoperative period and then suffered a late-onset vitreous hemorrhage.  Subsequent surgery cleared the vitreous hemorrhage, reattached the retina, and placement silicone oil for long-standing intravitreal tamponade was delivered.  Nonetheless, foreshortening of the retina inferiorly developed with recurrent vitreous hemorrhage, and secondary glaucoma developed.  This is an attempt to release the secondary glaucoma, remove the silicone oil from the anterior chamber and the silicone-corneal touch, as well as to provide vitreous fluid exchange and potential reattachment of the retina.  She understands the risks of anesthesia and the grim prognosis for retention of the globe, much less vision.  She understands an approximately 40% chance of retaining some vision and approximately a 70% chance of retaining the globe.  Understanding these issues, the patient wishes to proceed in this, her only eye.  DESCRIPTION OF PROCEDURE:  After appropriate signed consent was obtained, she was taken to the operating room.  In the operating room, general endotracheal anesthesia instituted without difficulty.  The right periocular region was sterilely prepped and draped in the usual ophthalmic fashion.  A lid speculum applied.  Conjunctival peritomy fashioned in each of three quadrants, excepting the inferonasal.  A 4 mm infusion was secured 3.5 mm  posterior to the limbus in the  inferotemporal quadrant.  Placement in the vitreous cavity verified using scleral depression because of the dense fibrovascular material along the vitreous base.  At this time the superior sclerotomies were then fashioned.  A wall microscope was placed in position.  Vitreous fluid extraction was then carried out, and the silicone oil was removed without complication.  Subsequently a paracentesis incision was made in the anterior chamber, and the silicone oil in the anterior chamber was aspirated with a blunt 20-gauge Angiocath needle with a 3 cc syringe.  No complications occurred.  The cornea remained clear.  At this time observation of the posterior retina confirmed that there is an inferior retinal detachment extending from the 2 oclock position nasally inferiorly through the 6 and over to the 8 oclock position. A taut intraretinal fibrous contracture was noted.  Retinotomy was then fashioned using peripheral endodiathermy to cauterize the peripheral retina to allow for scissors retinotomy and retinectomy.  This was carried out without difficulty to release the traction.  Medial contracture to the posterior pole was noted.  The disc was pink.  Contracture extended up inferiorly, up to the inferotemporal arcade.  Endolaser photocoagulation was then carried out in this area after a fluid-air exchange had been completed.  Subretinal fluid was now actually found to be subretinal silicone.  The soft-tip extrusion needle was then used to remove this.  At this time an air-fluid exchange was carried out with 50% fill to allow for the silicone oil to float to the middle of the vitreous.  This was then aspirated again.  Thereafter completion of the fluid-air exchange was done under air, and the superotemporal retina was confirmed to remain attached as well as the foveal region.  This appeared to be attached after the silicone oil had been removed.  At this time Endolaser retinopexy was  completed around the circumference of the posterior pole.  A decision was made to avoid silicone oil.  An air-SF6 20% exchange was then completed.  Superior sclerotomies were then closed.  The  infusion was removed and similarly closed with 7-0 Vicryl suture.  The conjunctiva closed with 7-0 Vicryl suture.  Subconjunctival injection of antibiotic and steroid applied.  The patient was awakened from anesthesia and taken to the recovery room in good and stable condition. Dictated by:   Ernesto Rutherford, M.D. Attending Physician:  Ernesto Rutherford DD:  05/19/01 TD:  05/20/01 Job: [id number]

## 2010-06-16 NOTE — Discharge Summary (Signed)
NAME:  [name], [name]                         ACCOUNT NO.:  [id number]   MEDICAL RECORD NO.:  [id number]                   PATIENT TYPE:  INP   LOCATION:  2022                                 FACILITY:  [facility]   PHYSICIAN:  Hollice Espy, M.D.            DATE OF BIRTH:  July 12, 1940   DATE OF ADMISSION:  08/11/2003  DATE OF DISCHARGE:  08/14/2003                                 DISCHARGE SUMMARY   PRIMARY CARE PHYSICIAN:  Dr. Fannie Knee Drinkler at [facility]   DISCHARGE DIAGNOSES:  1.  Urinary tract infection.  2.  Nausea and vomiting and periods of abdominal pain felt to be secondary      to nausea and vomiting.  3.  Poorly controlled diabetes.  4.  Hyperglycemia secondary to stress from urinary tract infection and      nausea and vomiting.  5.  History of hyperlipidemia.  6.  The patient is legally blind.   DISCHARGE MEDICATIONS:  1.  Previous medications are Glucotrol 10 mg p.o. b.i.d., Glucophage 500 mg      p.o. daily, Pepcid 20 mg daily, Lipitor 20 mg q.h.s.  2.  New addition is iron 325 mg p.o. daily.   HISTORY OF PRESENT ILLNESS:  The patient is a 70 year old African-American  female who presented on August 11, 2003 with a 2-day history of persistent  nausea and vomiting and what originally was described as chest pain but on  further examination and review of the patient it is mid epigastric pain and  radiating all over her belly.  The patient appeared to be severely markedly  dehydrated.  At that time she was admitted, started on IV fluids, and labs  were checks.  The patient was noted to have a normal white count; however,  she did have a 90% shift.  A urinalysis was done showing a moderate amount  of blood and 21-50 white cells with many bacteria.  She was additionally  started on IV Cipro as well.  By the second day, the patient was starting to  feel a little bit better, still having severe nausea but her vomiting had  decreased.  A KUB was done to rule out for ileus  which showed no evidence of  any problems.   Problem 1.  URINARY TRACT INFECTION:  She continued a course of antibiotics.  She received a total of 3 days and it was felt that this was ample enough  for her urinary tract infection which grew out E. coli pansensitive and she  did not need a further prescription for antibiotics on discharge.   Problem 2.  NAUSEA, VOMITING, ABDOMINAL PAIN:  A KUB was checked and showed  no evidence of any obstruction or ileus.  It was felt that likely her  nausea/vomiting was secondary to her hyperglycemia and urinary tract  infection.  She was well rehydrated and the patient was able to start  tolerating p.o. and  was able to tolerate a regular full meal prior to  discharge.   Problem 3.  DIABETES:  The patient does admit that she is a poorly  controlled diabetic and recently her medications were held secondary to some  mild dehydration and a bump up in her renal function.  However, once she was  tolerating p.o. and she received IV fluids her medications were resumed.   Problem 4.  HYPERLIPIDEMIA:  This issue remained stable during her stay.   Problem 5.  LEGAL BLINDNESS:  This episode remained stable during her  hospital stay.   Problem 6.  IRON DEFICIENCY ANEMIA:  On evaluation, initially the patient  was noted to have a slightly low H&H of 10.9 and 31.4.  More so an MCV was  done and was noted to be 76.6.  Iron studies were sent off and the patient  was noted to have a low iron and ferritin, a high to moderate TIBC likely  consistent with iron deficiency anemia.  The patient tells me that  previously she had been on iron but that she had stopped taking it.  I  recommended that she additionally continue this and she said that she would.  I gave her a prescription for iron 325 mg p.o. daily.   The patient is felt to be medically stable for discharge on August 14, 2003.  She is being discharged to home.  Her diet will be a low sodium 1800 ADA  diet.   Her activity will be as tolerated.  She is to follow up with Dr. Waldemar Dickens, her PCP, in the next 1 to 2 weeks.   DISPOSITION:  Improving.  She is being discharged to home.                                                Hollice Espy, M.D.    SKK/MEDQ  D:  10/05/2003  T:  10/06/2003  Job:  [id number]   cc:   Karl Bales, M.D.  [facility]

## 2010-10-02 ENCOUNTER — Emergency Department (HOSPITAL_COMMUNITY)
Admission: EM | Admit: 2010-10-02 | Discharge: 2010-10-02 | Disposition: A | Discharge status: Home / Self Care | Payer: PRIVATE HEALTH INSURANCE | Attending: Emergency Medicine | Admitting: Emergency Medicine

## 2010-10-02 DIAGNOSIS — H543 Unqualified visual loss, both eyes: Secondary | ICD-10-CM | POA: Insufficient documentation

## 2010-10-02 DIAGNOSIS — E119 Type 2 diabetes mellitus without complications: Secondary | ICD-10-CM | POA: Insufficient documentation

## 2010-10-02 DIAGNOSIS — I10 Essential (primary) hypertension: Secondary | ICD-10-CM | POA: Insufficient documentation

## 2010-10-02 DIAGNOSIS — M25569 Pain in unspecified knee: Secondary | ICD-10-CM | POA: Insufficient documentation

## 2010-10-02 DIAGNOSIS — Z7982 Long term (current) use of aspirin: Secondary | ICD-10-CM | POA: Insufficient documentation

## 2010-10-02 DIAGNOSIS — W06XXXA Fall from bed, initial encounter: Secondary | ICD-10-CM | POA: Insufficient documentation

## 2010-10-02 DIAGNOSIS — Z79899 Other long term (current) drug therapy: Secondary | ICD-10-CM | POA: Insufficient documentation

## 2010-10-02 LAB — GLUCOSE, CAPILLARY: Glucose-Capillary: 234 mg/dL — ABNORMAL HIGH (ref 70–99)

## 2010-10-26 LAB — HEPATIC FUNCTION PANEL
ALT: 15
AST: 16
Albumin: 2.7 — ABNORMAL LOW
Alkaline Phosphatase: 51
Bilirubin, Direct: 0.2
Indirect Bilirubin: 0.6
Total Bilirubin: 0.8
Total Protein: 5.8 — ABNORMAL LOW

## 2010-10-26 LAB — HEMOGLOBIN AND HEMATOCRIT, BLOOD
HCT: 35.3 — ABNORMAL LOW
Hemoglobin: 11.4 — ABNORMAL LOW

## 2010-10-26 LAB — APTT: aPTT: 25

## 2010-10-26 LAB — URINE CULTURE

## 2010-10-26 LAB — CBC
HCT: 31 — ABNORMAL LOW
HCT: 31.7 — ABNORMAL LOW
HCT: 36
HCT: 37.7
HCT: 38
Hemoglobin: 10.4 — ABNORMAL LOW
Hemoglobin: 10.7 — ABNORMAL LOW
Hemoglobin: 11.9 — ABNORMAL LOW
Hemoglobin: 12.8
MCHC: 32.2
MCHC: 33.2
MCV: 86.1
MCV: 86.3
MCV: 87
Platelets: 231
Platelets: 237
Platelets: 272
RBC: 3.56 — ABNORMAL LOW
RBC: 3.66 — ABNORMAL LOW
RBC: 3.93
RBC: 4.2
RBC: 4.55
RDW: 13.9
RDW: 13.9
RDW: 13.9
RDW: 14.1
RDW: 14.2
WBC: 11.9 — ABNORMAL HIGH
WBC: 12.3 — ABNORMAL HIGH
WBC: 9.5
WBC: 9.7

## 2010-10-26 LAB — BASIC METABOLIC PANEL
BUN: 13
BUN: 63 — ABNORMAL HIGH
CO2: 25
CO2: 26
Calcium: 8 — ABNORMAL LOW
Chloride: 103
Chloride: 104
Chloride: 94 — ABNORMAL LOW
Creatinine, Ser: 0.93
Creatinine, Ser: 1.18
Creatinine, Ser: 1.88 — ABNORMAL HIGH
GFR calc Af Amer: 56 — ABNORMAL LOW
GFR calc non Af Amer: 59 — ABNORMAL LOW
GFR calc non Af Amer: >60
Glucose, Bld: 263 — ABNORMAL HIGH
Glucose, Bld: 64 — ABNORMAL LOW
Glucose, Bld: 88
Potassium: 3.8
Potassium: 3.9
Sodium: 137
Sodium: 138

## 2010-10-26 LAB — URINE MICROSCOPIC-ADD ON

## 2010-10-26 LAB — CROSSMATCH

## 2010-10-26 LAB — DIFFERENTIAL
Basophils Absolute: 0
Basophils Relative: 0
Eosinophils Relative: 0
Lymphocytes Relative: 10 — ABNORMAL LOW
Monocytes Absolute: 0.5
Monocytes Relative: 4

## 2010-10-26 LAB — URINALYSIS, ROUTINE W REFLEX MICROSCOPIC
Glucose, UA: NEGATIVE
Ketones, ur: 15 — AB
Protein, ur: NEGATIVE
pH: 5

## 2010-10-26 LAB — B-NATRIURETIC PEPTIDE (CONVERTED LAB): Pro B Natriuretic peptide (BNP): 137 — ABNORMAL HIGH

## 2010-10-26 LAB — CREATININE, URINE, RANDOM: Creatinine, Urine: 76.2

## 2010-10-26 LAB — SODIUM, URINE, RANDOM: Sodium, Ur: 35

## 2010-10-26 LAB — SAMPLE TO BLOOD BANK

## 2010-10-26 LAB — OCCULT BLOOD X 1 CARD TO LAB, STOOL: Fecal Occult Bld: POSITIVE

## 2010-10-26 LAB — ABO/RH: ABO/RH(D): A POS

## 2010-11-03 ENCOUNTER — Emergency Department (HOSPITAL_COMMUNITY): Payer: PRIVATE HEALTH INSURANCE

## 2010-11-03 ENCOUNTER — Emergency Department (HOSPITAL_COMMUNITY)
Admission: EM | Admit: 2010-11-03 | Discharge: 2010-11-04 | Disposition: A | Discharge status: Home / Self Care | Payer: PRIVATE HEALTH INSURANCE | Attending: Emergency Medicine | Admitting: Emergency Medicine

## 2010-11-03 DIAGNOSIS — E119 Type 2 diabetes mellitus without complications: Secondary | ICD-10-CM | POA: Insufficient documentation

## 2010-11-03 DIAGNOSIS — D259 Leiomyoma of uterus, unspecified: Secondary | ICD-10-CM | POA: Insufficient documentation

## 2010-11-03 DIAGNOSIS — N390 Urinary tract infection, site not specified: Secondary | ICD-10-CM | POA: Insufficient documentation

## 2010-11-03 DIAGNOSIS — I10 Essential (primary) hypertension: Secondary | ICD-10-CM | POA: Insufficient documentation

## 2010-11-03 DIAGNOSIS — N898 Other specified noninflammatory disorders of vagina: Secondary | ICD-10-CM | POA: Insufficient documentation

## 2010-11-03 DIAGNOSIS — H543 Unqualified visual loss, both eyes: Secondary | ICD-10-CM | POA: Insufficient documentation

## 2010-11-03 LAB — URINE MICROSCOPIC-ADD ON

## 2010-11-03 LAB — COMPREHENSIVE METABOLIC PANEL
AST: 15 U/L (ref 0–37)
Albumin: 3.2 g/dL — ABNORMAL LOW (ref 3.5–5.2)
Alkaline Phosphatase: 73 U/L (ref 39–117)
Chloride: 101 mEq/L (ref 96–112)
Potassium: 5.1 mEq/L (ref 3.5–5.1)
Total Bilirubin: 0.2 mg/dL — ABNORMAL LOW (ref 0.3–1.2)

## 2010-11-03 LAB — URINALYSIS, ROUTINE W REFLEX MICROSCOPIC
Glucose, UA: 250 mg/dL — AB
Protein, ur: 100 mg/dL — AB

## 2010-11-03 LAB — DIFFERENTIAL
Basophils Absolute: 0 10*3/uL (ref 0.0–0.1)
Eosinophils Absolute: 0.1 10*3/uL (ref 0.0–0.7)
Lymphocytes Relative: 14 % (ref 12–46)
Neutrophils Relative %: 81 % — ABNORMAL HIGH (ref 43–77)

## 2010-11-03 LAB — CBC
Platelets: 214 10*3/uL (ref 150–400)
RDW: 14.5 % (ref 11.5–15.5)
WBC: 11.9 10*3/uL — ABNORMAL HIGH (ref 4.0–10.5)

## 2010-11-06 LAB — URINE CULTURE
Colony Count: 75000
Culture  Setup Time: 201210060154

## 2010-11-07 LAB — BASIC METABOLIC PANEL
CO2: 27
Calcium: 9.2
GFR calc Af Amer: >60
GFR calc non Af Amer: >60
Potassium: 3.6
Sodium: 134 — ABNORMAL LOW

## 2010-11-07 LAB — URINALYSIS, ROUTINE W REFLEX MICROSCOPIC
Glucose, UA: >1000 — AB
Hgb urine dipstick: NEGATIVE
pH: 6.5

## 2010-11-07 LAB — URINE MICROSCOPIC-ADD ON

## 2010-11-07 LAB — DIFFERENTIAL
Lymphocytes Relative: 15
Monocytes Absolute: 0.2
Monocytes Relative: 3
Neutro Abs: 5.5

## 2010-11-07 LAB — CBC
HCT: 40.9
Hemoglobin: 13.6
RBC: 4.84

## 2010-12-12 ENCOUNTER — Emergency Department (HOSPITAL_COMMUNITY)
Admission: EM | Admit: 2010-12-12 | Discharge: 2010-12-12 | Disposition: A | Discharge status: Home / Self Care | Payer: Medicare Other | Attending: Emergency Medicine | Admitting: Emergency Medicine

## 2010-12-12 ENCOUNTER — Encounter: Payer: Self-pay | Admitting: Family Medicine

## 2010-12-12 DIAGNOSIS — R32 Unspecified urinary incontinence: Secondary | ICD-10-CM | POA: Insufficient documentation

## 2010-12-12 DIAGNOSIS — N390 Urinary tract infection, site not specified: Secondary | ICD-10-CM | POA: Insufficient documentation

## 2010-12-12 DIAGNOSIS — I10 Essential (primary) hypertension: Secondary | ICD-10-CM | POA: Insufficient documentation

## 2010-12-12 DIAGNOSIS — E119 Type 2 diabetes mellitus without complications: Secondary | ICD-10-CM | POA: Insufficient documentation

## 2010-12-12 DIAGNOSIS — H543 Unqualified visual loss, both eyes: Secondary | ICD-10-CM | POA: Insufficient documentation

## 2010-12-12 HISTORY — DX: Essential (primary) hypertension: I10

## 2010-12-12 HISTORY — DX: Unspecified visual loss: H54.7

## 2010-12-12 LAB — GLUCOSE, CAPILLARY

## 2010-12-12 LAB — URINALYSIS, ROUTINE W REFLEX MICROSCOPIC
Bilirubin Urine: NEGATIVE
Glucose, UA: NEGATIVE mg/dL
Nitrite: NEGATIVE
Specific Gravity, Urine: 1.019 (ref 1.005–1.030)
pH: 5.5 (ref 5.0–8.0)

## 2010-12-12 LAB — URINE MICROSCOPIC-ADD ON

## 2010-12-12 MED ORDER — CIPROFLOXACIN HCL 250 MG PO TABS: 250.0000 mg | ORAL_TABLET | Freq: Two times a day (BID) | ORAL | Status: AC

## 2010-12-12 NOTE — ED Notes (Signed)
Per EMS: Pt from independent living center. Reports urinary incontinence x1 week.

## 2010-12-12 NOTE — ED Notes (Signed)
Waiting to hear back from case management before discharging pt. Charge RN and Dr. Freida Busman notified.

## 2010-12-12 NOTE — ED Notes (Signed)
Left a message with case management regarding helping pt obtain medications before discharge home. Pt tearful and states there is no way for her to get her medicines today.

## 2010-12-12 NOTE — ED Provider Notes (Signed)
History     CSN: [id number] Arrival date & time: 12/12/2010  7:08 AM   First MD Initiated Contact with Patient 12/12/10 [phone]      Chief Complaint  Patient presents with  . Urinary Incontinence    (Consider location/radiation/quality/duration/timing/severity/associated sxs/prior treatment) The history is provided by the patient.  Pt has stated that for the last week or so she has been constantly leaking urine. It gets worse when she lays down. It never seems to fully go away. Prior to this she has had no problems with urinary incontinence. 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 She states that she had vaginal bleeding in October but it has since resolved. She states that she smells the pads she has been using and they do not smell like blood. She has not tried anything for the incontinence. She has high blood pressure and diabetes. She denies fever, chills, abdominal pain, change in her bowels, sexual activity. She denies chest pain, cold like symptoms. States she has had an episode of nausea but did not vomit.   Past Medical History  Diagnosis Date  . Hypertension   . Diabetes mellitus   . Blindness     No past surgical history on file.  No family history on file.  History  Substance Use Topics  . Smoking status: Not on file  . Smokeless tobacco: Not on file  . Alcohol Use:     OB History    Grav Para Term Preterm Abortions TAB SAB Ect Mult Living                  Review of Systems  Constitutional: Negative for fever, chills, activity change, appetite change, fatigue and unexpected weight change.  HENT: Negative.   Eyes: Negative.   Respiratory: Negative for cough, chest tightness, shortness of breath and wheezing.   Cardiovascular: Negative for chest pain, palpitations and leg swelling.  Gastrointestinal: Positive for nausea. Negative for vomiting, abdominal pain, diarrhea, constipation and abdominal distention.  Genitourinary: Positive for  frequency. Negative for dysuria, urgency, hematuria, flank pain, decreased urine volume, vaginal bleeding, vaginal discharge, enuresis, difficulty urinating, genital sores, vaginal pain, menstrual problem, pelvic pain and dyspareunia.  Musculoskeletal: Negative.   Skin: Negative.   Neurological: Negative for dizziness, tremors, seizures, syncope, facial asymmetry, speech difficulty, weakness, light-headedness, numbness and headaches.  All other systems reviewed and are negative.    Allergies  Review of patient's allergies indicates no known allergies.  Home Medications   Current Outpatient Rx  Name Route Sig Dispense Refill  . AMLODIPINE BESYLATE 2.5 MG PO TABS Oral Take 2.5 mg by mouth daily.      . ASPIRIN 325 MG PO TABS Oral Take 325 mg by mouth daily.      Marland Kitchen DOCUSATE SODIUM 100 MG PO CAPS Oral Take 100 mg by mouth 2 (two) times daily.      Marland Kitchen GLIPIZIDE 10 MG PO TABS Oral Take 10 mg by mouth 2 (two) times daily before a meal.      . LISINOPRIL 20 MG PO TABS Oral Take 20 mg by mouth daily.        BP 153/75  Pulse 71  Temp(Src) 98.8 F (37.1 C) (Oral)  Resp 20  SpO2 95%  Physical Exam  Vitals reviewed. Constitutional: She is oriented to person, place, and time. She appears well-developed and well-nourished.  HENT:  Head: Normocephalic and atraumatic.  Eyes:       Pt is blind  Neck:  Normal range of motion. Neck supple. No tracheal deviation present. No thyromegaly present.  Cardiovascular: Normal rate and regular rhythm.   Pulmonary/Chest: Effort normal and breath sounds normal. No respiratory distress. She has no wheezes.  Abdominal: Soft. Bowel sounds are normal. She exhibits no distension and no mass. There is no tenderness. There is no rebound and no guarding.  Musculoskeletal: Normal range of motion.  Lymphadenopathy:    She has no cervical adenopathy.  Neurological: She is alert and oriented to person, place, and time. No cranial nerve deficit.  Skin: Skin is warm and  dry.    ED Course  Procedures (including critical care time)  Labs Reviewed  URINALYSIS, ROUTINE W REFLEX MICROSCOPIC - Abnormal; Notable for the following:    Appearance CLOUDY (*)    Hgb urine dipstick SMALL (*)    Protein, ur 100 (*)    Leukocytes, UA SMALL (*)    All other components within normal limits  GLUCOSE, CAPILLARY - Abnormal; Notable for the following:    Glucose-Capillary 169 (*)    All other components within normal limits  URINE MICROSCOPIC-ADD ON - Abnormal; Notable for the following:    Squamous Epithelial / LPF FEW (*)    Bacteria, UA MANY (*)    All other components within normal limits   No results found.   1. UTI (urinary tract infection)       MDM  Pt is having new onset urinary incontinence, will check U/A and CBG. Pt recently had [place] of pelvis that showed minimal fibroids so not likely to be from a space occupying lesion, otherwise would recommend seeing her PCP for some incontinence medication trial.   8:35 AM CBG 169 so not likely to be the cause of the increased urination.     9:54 AM Pt seen and discussed the results of her urine sample with her and informed her that she had a UTI and that we would treat it. She is not feeling nauseous. Will discharge her back home today.  Genella Mech, MD 12/12/10 1113

## 2010-12-12 NOTE — Discharge Planning (Signed)
CM spoke with ED RN. Pt being d/c back home and wanting assist with medications.  Noted pt is a medicare pt.  Not eligible for [facility] medication assistance program.  Pt blind and will need assist with getting medications.  Pt agreed to have medications delivered by [facility] [phone].  Spoke with Multimedia programmer with pt to coordinate delivery of medication (Cipro 250 MG po bid for 4 days) Faxed copy of Rx to [phone].  Pt provided specific instructions to pharmacist.  Pt to be transported back home by [facility].  Pt reports minimal support system although she has family in [place], [place] Pt has enough money to complete medication delivery.  ED RN updated

## 2010-12-12 NOTE — ED Notes (Signed)
[id number]<AV> Expected date:12/12/10<BR> Expected time: 6:47 AM<BR> Means of arrival:Ambulance<BR> Comments:<BR> Weak and incontinent

## 2010-12-12 NOTE — ED Provider Notes (Signed)
I saw and evaluated the patient, reviewed the resident's note and I agree with the findings and plan.  Patient with one-week history of worsening urinary incontinence. Denies any fever or vomiting. History of similar symptoms associated with bladder infections. Will check urine and treat as appropriate  Toy Baker, MD 12/12/10 [phone]

## 2010-12-12 NOTE — ED Notes (Signed)
[facility] at bedside 

## 2010-12-12 NOTE — ED Notes (Signed)
[facility] notified of need for transport. 

## 2010-12-12 NOTE — ED Notes (Signed)
Kim, case management, is coming to speak with pt about filling rx before discharge. NAD noted at this time.

## 2010-12-14 NOTE — ED Provider Notes (Signed)
I saw and evaluated the patient, reviewed the resident's note and I agree with the findings and plan.  Toy Baker, MD 12/14/10 [phone]

## 2011-03-18 ENCOUNTER — Encounter (HOSPITAL_COMMUNITY): Payer: Self-pay

## 2011-03-18 ENCOUNTER — Emergency Department (HOSPITAL_COMMUNITY)
Admission: EM | Admit: 2011-03-18 | Discharge: 2011-03-19 | Disposition: A | Discharge status: Home / Self Care | Payer: Medicare Other | Attending: Emergency Medicine | Admitting: Emergency Medicine

## 2011-03-18 ENCOUNTER — Emergency Department (HOSPITAL_COMMUNITY): Payer: Medicare Other

## 2011-03-18 DIAGNOSIS — R1031 Right lower quadrant pain: Secondary | ICD-10-CM | POA: Insufficient documentation

## 2011-03-18 DIAGNOSIS — N200 Calculus of kidney: Secondary | ICD-10-CM | POA: Insufficient documentation

## 2011-03-18 DIAGNOSIS — N950 Postmenopausal bleeding: Secondary | ICD-10-CM | POA: Insufficient documentation

## 2011-03-18 DIAGNOSIS — E119 Type 2 diabetes mellitus without complications: Secondary | ICD-10-CM | POA: Insufficient documentation

## 2011-03-18 DIAGNOSIS — R1900 Intra-abdominal and pelvic swelling, mass and lump, unspecified site: Secondary | ICD-10-CM | POA: Insufficient documentation

## 2011-03-18 DIAGNOSIS — I10 Essential (primary) hypertension: Secondary | ICD-10-CM | POA: Insufficient documentation

## 2011-03-18 DIAGNOSIS — D259 Leiomyoma of uterus, unspecified: Secondary | ICD-10-CM | POA: Insufficient documentation

## 2011-03-18 LAB — CBC
HCT: 43.7 % (ref 36.0–46.0)
MCH: 28.3 pg (ref 26.0–34.0)
MCV: 87.8 fL (ref 78.0–100.0)
Platelets: 217 10*3/uL (ref 150–400)
RBC: 4.98 MIL/uL (ref 3.87–5.11)

## 2011-03-18 LAB — URINALYSIS, ROUTINE W REFLEX MICROSCOPIC
Nitrite: NEGATIVE
Protein, ur: 30 mg/dL — AB
Specific Gravity, Urine: 1.016 (ref 1.005–1.030)
Urobilinogen, UA: 0.2 mg/dL (ref 0.0–1.0)

## 2011-03-18 LAB — DIFFERENTIAL
Eosinophils Absolute: 0.2 10*3/uL (ref 0.0–0.7)
Eosinophils Relative: 2 % (ref 0–5)
Lymphocytes Relative: 9 % — ABNORMAL LOW (ref 12–46)
Lymphs Abs: 1.2 10*3/uL (ref 0.7–4.0)
Monocytes Absolute: 0.7 10*3/uL (ref 0.1–1.0)

## 2011-03-18 LAB — URINE MICROSCOPIC-ADD ON

## 2011-03-18 MED ORDER — ONDANSETRON HCL 4 MG/2ML IJ SOLN
4.0000 mg | Freq: Once | INTRAMUSCULAR | Status: AC
  Administered 2011-03-19: 4 mg via INTRAVENOUS
  Filled 2011-03-18: qty 2

## 2011-03-18 MED ORDER — SODIUM CHLORIDE 0.9 % IV SOLN
Freq: Once | INTRAVENOUS | Status: AC
  Administered 2011-03-19: 01:00:00 via INTRAVENOUS

## 2011-03-18 MED ORDER — HYDROMORPHONE HCL PF 1 MG/ML IJ SOLN
1.0000 mg | Freq: Once | INTRAMUSCULAR | Status: AC
  Administered 2011-03-19: 0.5 mg via INTRAVENOUS
  Filled 2011-03-18: qty 1

## 2011-03-18 NOTE — ED Notes (Signed)
[id number]<JY> Expected date:03/18/11<BR> Expected time: 9:26 PM<BR> Means of arrival:Ambulance<BR> Comments:<BR> M211. 71 yo f. abd pain, tender right side, vag bleeding,hx of bladder infection with same symptoms. 10 min

## 2011-03-18 NOTE — ED Provider Notes (Signed)
History     CSN: [id number]  Arrival date & time 03/18/11  2139   First MD Initiated Contact with Patient 03/18/11 2214      Chief Complaint  Patient presents with  . Vaginal Bleeding  . Abdominal Pain    (Consider location/radiation/quality/duration/timing/severity/associated sxs/prior treatment) Patient is a 71 y.o. female presenting with vaginal bleeding and abdominal pain. The history is provided by the patient.  Vaginal Bleeding Associated symptoms include abdominal pain.  Abdominal Pain The primary symptoms of the illness include abdominal pain and vaginal bleeding.  She has been having urinary frequency for the last 2 weeks. She denies urinary urgency, frequency, tenesmus, or dysuria. This evening, she did notice a few drops of blood and she has had that with prior urinary tract infections. This evening, she also developed pain in the right lower abdomen without radiation. Pain is crampy and severe and she rates it at 10 out of 10. Nothing makes it better nothing makes it worse. She denies associated fever, chills, sweats and denies nausea, vomiting, diarrhea. She took a pain pill which she was prescribed at her last ED visit, but she does not know what it was. It did not help her pain.  Past Medical History  Diagnosis Date  . Hypertension   . Diabetes mellitus   . Blindness     History reviewed. No pertinent past surgical history.  History reviewed. No pertinent family history.  History  Substance Use Topics  . Smoking status: Never Smoker   . Smokeless tobacco: Not on file  . Alcohol Use: No    OB History    Grav Para Term Preterm Abortions TAB SAB Ect Mult Living                  Review of Systems  Gastrointestinal: Positive for abdominal pain.  Genitourinary: Positive for vaginal bleeding.  All other systems reviewed and are negative.    Allergies  Review of patient's allergies indicates no known allergies.  Home Medications   Current Outpatient  Rx  Name Route Sig Dispense Refill  . AMLODIPINE BESYLATE 2.5 MG PO TABS Oral Take 2.5 mg by mouth daily.      . ASPIRIN EC 325 MG PO TBEC Oral Take 325 mg by mouth daily.      Marland Kitchen GLIPIZIDE 10 MG PO TABS Oral Take 10 mg by mouth 2 (two) times daily before a meal.      . LISINOPRIL 20 MG PO TABS Oral Take 20 mg by mouth daily.        BP 157/71  Pulse 66  Temp(Src) 98.8 F (37.1 C) (Oral)  Resp 18  Ht 5\' 2"  (1.575 m)  Wt 200 lb (90.719 kg)  BMI 36.58 kg/m2  SpO2 89%  Physical Exam  Nursing note and vitals reviewed.  71 year old female who is resting comfortably and in no acute distress. Vital signs are significant for mild hypertension with blood pressure 132/101. Oxygen saturation is 95% which is normal. Head is normocephalic and atraumatic. PERRLA, EOMI. Oropharynx is clear. Neck is nontender and supple without adenopathy JVD. Lungs are clear without rales, wheezes, or rhonchi. There is no CVA tenderness and back is nontender. Heart has regular rate and rhythm without murmur. Is no chest wall tenderness. Abdomen is soft, flat, with moderate tenderness throughout the right side of the abdomen without rebound or guarding. There is no localized tenderness. Peristalsis is diminished. Extremities have no cyanosis or edema, full range of motion is present. Skin is  warm and dry without rash. Neurologic: Mental status is normal, cranial nerves are intact, there no focal motor or sensory deficits.  ED Course  Procedures (including critical care time)  Results for orders placed during the hospital encounter of 03/18/11  GLUCOSE, CAPILLARY      Component Value Range   Glucose-Capillary 258 (*) 70 - 99 (mg/dL)  CBC      Component Value Range   WBC 13.0 (*) 4.0 - 10.5 (K/uL)   RBC 4.98  3.87 - 5.11 (MIL/uL)   Hemoglobin 14.1  12.0 - 15.0 (g/dL)   HCT 13.0  86.5 - 78.4 (%)   MCV 87.8  78.0 - 100.0 (fL)   MCH 28.3  26.0 - 34.0 (pg)   MCHC 32.3  30.0 - 36.0 (g/dL)   RDW 69.6  29.5 - 28.4 (%)    Platelets 217  150 - 400 (K/uL)  DIFFERENTIAL      Component Value Range   Neutrophils Relative 83 (*) 43 - 77 (%)   Neutro Abs 10.8 (*) 1.7 - 7.7 (K/uL)   Lymphocytes Relative 9 (*) 12 - 46 (%)   Lymphs Abs 1.2  0.7 - 4.0 (K/uL)   Monocytes Relative 6  3 - 12 (%)   Monocytes Absolute 0.7  0.1 - 1.0 (K/uL)   Eosinophils Relative 2  0 - 5 (%)   Eosinophils Absolute 0.2  0.0 - 0.7 (K/uL)   Basophils Relative 0  0 - 1 (%)   Basophils Absolute 0.0  0.0 - 0.1 (K/uL)  COMPREHENSIVE METABOLIC PANEL      Component Value Range   Sodium 136  135 - 145 (mEq/L)   Potassium 4.4  3.5 - 5.1 (mEq/L)   Chloride 101  96 - 112 (mEq/L)   CO2 25  19 - 32 (mEq/L)   Glucose, Bld 257 (*) 70 - 99 (mg/dL)   BUN 30 (*) 6 - 23 (mg/dL)   Creatinine, Ser 1.32  0.50 - 1.10 (mg/dL)   Calcium 9.8  8.4 - 44.0 (mg/dL)   Total Protein 7.9  6.0 - 8.3 (g/dL)   Albumin 3.0 (*) 3.5 - 5.2 (g/dL)   AST 36  0 - 37 (U/L)   ALT 31  0 - 35 (U/L)   Alkaline Phosphatase 95  39 - 117 (U/L)   Total Bilirubin 0.3  0.3 - 1.2 (mg/dL)   GFR calc non Af Amer 61 (*) >90 (mL/min)   GFR calc Af Amer 71 (*) >90 (mL/min)  LIPASE, BLOOD      Component Value Range   Lipase 19  11 - 59 (U/L)  URINALYSIS, ROUTINE W REFLEX MICROSCOPIC      Component Value Range   Color, Urine YELLOW  YELLOW    APPearance CLOUDY (*) CLEAR    Specific Gravity, Urine 1.016  1.005 - 1.030    pH 6.5  5.0 - 8.0    Glucose, UA NEGATIVE  NEGATIVE (mg/dL)   Hgb urine dipstick LARGE (*) NEGATIVE    Bilirubin Urine NEGATIVE  NEGATIVE    Ketones, ur NEGATIVE  NEGATIVE (mg/dL)   Protein, ur 30 (*) NEGATIVE (mg/dL)   Urobilinogen, UA 0.2  0.0 - 1.0 (mg/dL)   Nitrite NEGATIVE  NEGATIVE    Leukocytes, UA TRACE (*) NEGATIVE   URINE MICROSCOPIC-ADD ON      Component Value Range   Squamous Epithelial / LPF RARE  RARE    WBC, UA 3-6  <3 (WBC/hpf)   RBC / HPF 11-20  <3 (  RBC/hpf)   Bacteria, UA MANY (*) RARE    Ct Abdomen Pelvis W Contrast  03/19/2011  *RADIOLOGY  REPORT*  Clinical Data: Right lower quadrant abdominal pain and tenderness extending into the right groin.  Previous urinary tract infections.  CT ABDOMEN AND PELVIS WITH CONTRAST  Technique:  Multidetector CT imaging of the abdomen and pelvis was performed following the standard protocol during bolus administration of intravenous contrast.  Contrast: OMNIPAQUE IOHEXOL 300 MG/ML IV SOLN  Comparison: 05/09/2009 and pelvic ultrasound dated 11/03/2010.  Findings: Multiple small bilateral renal calculi.  The largest is in the lower pole of the right kidney, measuring 8 mm in maximum diameter.  No ureteral or bladder calculi and no hydronephrosis.  Normal appearing appendix in the right lower abdomen.  Mildly progressive bilateral perinephric stranding.  Fundal uterine mass on the right measuring 4.9 x 4.9 cm in maximum dimensions on image number 69.  Low density in the endometrial and cervical canal with a widened cervical canal measuring 15.4 mm in diameter on sagittal image number 33.  There is a second fundal fibroid on that image measuring 3.4 cm in maximum diameter on image number 73.  The more inferior fundal fibroid is causing superior displacement and bowing of the overlying endometrial stripe.  The stripe is narrowed between these two fibroids.  The remainder of the endometrial canal is dilated, measuring 9.7 mm in thickness on sagittal image number 33.  Cholecystectomy clips are again demonstrated.  A left lobe liver calcified granuloma is noted.  Unremarkable spleen, pancreas, adrenal glands and ovaries.  Scattered colonic diverticula without evidence of diverticulitis.  No enlarged lymph nodes.  Essentially clear lung bases.  Thoracic spine degenerative changes.  IMPRESSION:  1.  Dilated, low-density, mildly heterogeneous endometrial and cervical canals, suggesting the possibility of an obstructing cervical or endometrial malignancy. 2.  Fundal uterine fibroids with submucosal components. 3.  Multiple  small, bilateral nonobstructing renal calculi.  Original Report Authenticated By: Darrol Angel, M.D.      1. Postmenopausal vaginal bleeding   2. Pelvic mass in female       MDM  Right-sided abdominal pain which I doubt is solely due to UTI. Suspect diverticulitis. Urinalysis will be obtained as well as CT scan of the abdomen as routine laboratory testing. Prior ED visits have been reviewed and she had been seen for a UTI and given a course of ciprofloxacin - this was about 3 months ago.        Dione Booze, MD 03/19/11 [phone]

## 2011-03-18 NOTE — ED Notes (Signed)
[facility]  Reports pt c/o of tenderness with increased pain to rt side of stomach upon palpation radiating into rt groin

## 2011-03-18 NOTE — ED Notes (Signed)
Per [facility]- pt reports rt sided abdominal pain since 4pm this afternoon- vaginal bleeding started 1 hour ago- pt report vaginal bleeding small amount bright red blood on pad- denies N/V/D- pt has HX of bladder infection and "it has presented in the exact same way"

## 2011-03-18 NOTE — ED Notes (Signed)
Difficult IV stick- pt requesting IV team . IV team paged

## 2011-03-18 NOTE — ED Notes (Signed)
IV team Debbie RN responded- aware of need- Ebony aware of need for blood

## 2011-03-18 NOTE — ED Notes (Signed)
PT IS LEGALLY BLIND 

## 2011-03-19 LAB — COMPREHENSIVE METABOLIC PANEL
ALT: 31 U/L (ref 0–35)
BUN: 30 mg/dL — ABNORMAL HIGH (ref 6–23)
CO2: 25 mEq/L (ref 19–32)
Calcium: 9.8 mg/dL (ref 8.4–10.5)
Creatinine, Ser: 0.94 mg/dL (ref 0.50–1.10)
GFR calc Af Amer: 71 mL/min — ABNORMAL LOW (ref >90)
GFR calc non Af Amer: 61 mL/min — ABNORMAL LOW (ref >90)
Glucose, Bld: 257 mg/dL — ABNORMAL HIGH (ref 70–99)
Sodium: 136 mEq/L (ref 135–145)
Total Protein: 7.9 g/dL (ref 6.0–8.3)

## 2011-03-19 LAB — LIPASE, BLOOD: Lipase: 19 U/L (ref 11–59)

## 2011-03-19 MED ORDER — IOHEXOL 300 MG/ML  SOLN
100.0000 mL | Freq: Once | INTRAMUSCULAR | Status: AC | PRN
  Administered 2011-03-19: 100 mL via INTRAVENOUS

## 2011-03-19 NOTE — ED Notes (Signed)
Oral contrast complete 

## 2011-03-19 NOTE — Discharge Instructions (Signed)
It is very important that you followup with GYN specialist as discussed.  Pelvic Mass A "mass" is a lump that either your caregiver found during an examination or you found before seeing your caregiver. The "pelvis" is the lower portion of the trunk in between the hip bones. There are many possible reasons why a lump has appeared. Testing will help determine the cause and the steps to a solution. CAUSES  Before complete testing is done, it may be difficult or impossible for your caregiver to know if the lump is truly in one of the pelvic organs (such as the uterus or ovaries) or is coming from one of many organs that are near the pelvis. Problems in the colon or kidney can also lead to a lump that might seem to be in the pelvis. If testing shows that the mass is in the pelvis, there are still many possible causes:  Tumors and cancers. These problems are relatively common and are the greatest source of worry for patients. Cancerous lumps in the pelvis may be due to cancers that started in the uterus or ovaries or due to cancers that started in other areas and then spread to the pelvis. Many cancers are very treatable when found early.   Non-cancerous tumors and masses. There are a large number of common and uncommon non-cancerous problems that can lead to a mass in the pelvis. Two very common ones are fibroids of the uterus and ovarian cysts. Before testing and/or surgery, it may be impossible to tell the difference between these problems and a cancer.   Infection. Certain types of infections can produce a mass in the pelvis. The infection might be caused by bacteria. If there is an infection treatment might include antibiotics. Masses from infection can also be caused by certain viruses, and in rare cases, by fungi or parasites. If infection is the cause, your caregiver will be able to determine the type of germ responsible for the mass by doing appropriate testing.   Inflammatory bowel disease. These  are diseases thought to be caused by a defect in the immune system of the intestine. There are two inflammatory bowel diseases: Ulcerative Colitis and Crohn's Disease. They are lifelong problems with symptoms that can come and go. Sometimes, patients with these diseases will develop a mass in the lower part of the colon that can make it seem as though there is a mass in the pelvis.   Past Surgery. If there has been pelvic surgery in the past, and there is a lot of scarring that forms during the process of healing, this can eventually fell like a mass when examined by your caregiver. As with the other problems described above, this may or may not be associated with symptoms or feeling badly.   Ectopic Pregnancy. This is a condition where the growing fetus is growing outside of the uterus. This is a common cause for a pelvic mass and may become a serious or life-threatening problem that requires immediate surgery.  SYMPTOMS  In people with a pelvic mass, there may be a large variety of associated symptoms including:   No symptoms, other than the appearance of the mass itself.   Cramping, nausea, diarrhea.   Fever, vomiting, weakness.   Pelvic, side, and/or back pain.   Weight loss.   Constipation.   Problems with vaginal bleeding. This can be very variable. Bleeding might be very light or very heavy. Bleeding may be mixed with large clots. Menstruation may be very frequent and  may seem to almost completely stop. There may be varying levels of pain with menstruation.   Urinary symptoms including frequent urination, inability to empty the bladder completely, or urinating very small amounts.  DIAGNOSIS  Because of the large number of causes of a mass in the pelvis, your caregiver will ask you to undergo testing in order to get a clear diagnosis in a timely manner. The tests might include some or all of the following:  Blood tests such as a blood count, measurement of common minerals in the  blood, kidney/liver/pancreas function, pregnancy test, and others.   X-rays. Plain x-rays and special x-rays may be requested except if you are pregnant.   Ultrasound. This is a test that uses sound waves to "paint a picture" of the mass. The type of "sound picture" that is seen can help to narrow the diagnosis.   CAT scan and MRI imaging. Each can provide additional information as to the different characteristics of the mass and can help to develop a final plan for diagnostics and treatment. If cancer is suspected, these special tests can also help to show any spread of the cancer to other parts of the body. It is possible that these tests may not be ordered if you are pregnant.   Laparoscopy. This is a special exam of the inside of the pelvic area using a slim, flexible, lighted tube. This allows your caregiver to get a direct look at the mass. Sometimes, this allows getting a very small piece of the mass (a biopsy). This piece of tissue can then be examined in a lab that will frequently lead to a clear diagnosis. In some cases, your caregiver can use a laparoscope to completely remove the mass after it has been examined.   Surgery. Sometimes, a diagnosis can only be made by carrying out an operation and obtaining a biopsy (as noted above). Many times, the biopsy is obtained and the mass is removed during the same operation.  These are the most common ways for determining the exact cause of the mass. Your caregiver may recommend other tests that are not listed here. TREATMENT  Treatment(s) can only be recommended after a diagnosis is made. Your caregiver will discuss your test results with you, the meanings of the tests, and the recommended steps to begin treatment. He/she will also recommend whether you need to be examined by specialists as you go through the steps of diagnosis and a treatment plan is developed. HOME CARE INSTRUCTIONS   Test preparation. Carefully follow instructions when  preparing for certain tests. This may involve many things such as:   Drinking fluids to fill the bladder before a pelvic ultrasound.   Fasting before certain blood tests.   Drinking special "contrast" fluids that are necessary for obtaining the best CAT scan and MRI images.   Medications. Your caregiver may prescribe medications to help relieve symptoms while you undergo testing. It is important that your current medications (prescription, non-prescription, herbal, vitamins, etc.) be kept in mind when new prescriptions are recommended.   Diet. There may be a need for changes in diet in order to help with symptom relief while testing is being done. If this applies to you, your caregiver will discuss these changes with you.  SEEK MEDICAL CARE IF:   You cannot hold down any of the recommended fluids used to prepare for tests such as CAT scan MRI.   You feel that you are having trouble with any new prescriptions.   You develop  new symptoms of pain, vomiting, diarrhea, fever, or other problems that you did not feel since your last exam.   You experience inability to empty your bladder completely or develop painful and/or bloody urination.  SEEK IMMEDIATE MEDICAL CARE IF:   You vomit bright red blood, or a coffee ground appearing material.   You have blood in your stools, or the stools turn black and tarry.   You have an abnormal or increased amount of vaginal bleeding.   You have a fever.   You develop easy bruising or bleeding.   You develop pain that is not controlled by your medication.   You feel worsening weakness or you have a fainting episode.   You feel that the mass has suddenly gotten larger.   You develop severe bloating in the abdomen and/or pelvis.   You cannot pass any urine.  MAKE SURE YOU:   Understand these instructions.   Will watch your condition.   Will get help right away if you are not doing well or get worse.  Document Released: 04/24/2006 Document  Revised: 09/27/2010 Document Reviewed: 12/31/2006 [facility] Patient Information 2012 [place], [place].

## 2011-03-19 NOTE — ED Notes (Signed)
Patient care assumed from Dr. Preston Fleeting at 12 AM pending CT scan.   Results for orders placed during the hospital encounter of 03/18/11  GLUCOSE, CAPILLARY      Component Value Range   Glucose-Capillary 258 (*) 70 - 99 (mg/dL)  CBC      Component Value Range   WBC 13.0 (*) 4.0 - 10.5 (K/uL)   RBC 4.98  3.87 - 5.11 (MIL/uL)   Hemoglobin 14.1  12.0 - 15.0 (g/dL)   HCT 16.1  09.6 - 04.5 (%)   MCV 87.8  78.0 - 100.0 (fL)   MCH 28.3  26.0 - 34.0 (pg)   MCHC 32.3  30.0 - 36.0 (g/dL)   RDW 40.9  81.1 - 91.4 (%)   Platelets 217  150 - 400 (K/uL)  DIFFERENTIAL      Component Value Range   Neutrophils Relative 83 (*) 43 - 77 (%)   Neutro Abs 10.8 (*) 1.7 - 7.7 (K/uL)   Lymphocytes Relative 9 (*) 12 - 46 (%)   Lymphs Abs 1.2  0.7 - 4.0 (K/uL)   Monocytes Relative 6  3 - 12 (%)   Monocytes Absolute 0.7  0.1 - 1.0 (K/uL)   Eosinophils Relative 2  0 - 5 (%)   Eosinophils Absolute 0.2  0.0 - 0.7 (K/uL)   Basophils Relative 0  0 - 1 (%)   Basophils Absolute 0.0  0.0 - 0.1 (K/uL)  COMPREHENSIVE METABOLIC PANEL      Component Value Range   Sodium 136  135 - 145 (mEq/L)   Potassium 4.4  3.5 - 5.1 (mEq/L)   Chloride 101  96 - 112 (mEq/L)   CO2 25  19 - 32 (mEq/L)   Glucose, Bld 257 (*) 70 - 99 (mg/dL)   BUN 30 (*) 6 - 23 (mg/dL)   Creatinine, Ser 7.82  0.50 - 1.10 (mg/dL)   Calcium 9.8  8.4 - 95.6 (mg/dL)   Total Protein 7.9  6.0 - 8.3 (g/dL)   Albumin 3.0 (*) 3.5 - 5.2 (g/dL)   AST 36  0 - 37 (U/L)   ALT 31  0 - 35 (U/L)   Alkaline Phosphatase 95  39 - 117 (U/L)   Total Bilirubin 0.3  0.3 - 1.2 (mg/dL)   GFR calc non Af Amer 61 (*) >90 (mL/min)   GFR calc Af Amer 71 (*) >90 (mL/min)  LIPASE, BLOOD      Component Value Range   Lipase 19  11 - 59 (U/L)  URINALYSIS, ROUTINE W REFLEX MICROSCOPIC      Component Value Range   Color, Urine YELLOW  YELLOW    APPearance CLOUDY (*) CLEAR    Specific Gravity, Urine 1.016  1.005 - 1.030    pH 6.5  5.0 - 8.0    Glucose, UA NEGATIVE  NEGATIVE  (mg/dL)   Hgb urine dipstick LARGE (*) NEGATIVE    Bilirubin Urine NEGATIVE  NEGATIVE    Ketones, ur NEGATIVE  NEGATIVE (mg/dL)   Protein, ur 30 (*) NEGATIVE (mg/dL)   Urobilinogen, UA 0.2  0.0 - 1.0 (mg/dL)   Nitrite NEGATIVE  NEGATIVE    Leukocytes, UA TRACE (*) NEGATIVE   URINE MICROSCOPIC-ADD ON      Component Value Range   Squamous Epithelial / LPF RARE  RARE    WBC, UA 3-6  <3 (WBC/hpf)   RBC / HPF 11-20  <3 (RBC/hpf)   Bacteria, UA MANY (*) RARE    Ct Abdomen Pelvis W Contrast  03/19/2011  *RADIOLOGY REPORT*  Clinical Data: Right lower quadrant abdominal pain and tenderness extending into the right groin.  Previous urinary tract infections.  CT ABDOMEN AND PELVIS WITH CONTRAST  Technique:  Multidetector CT imaging of the abdomen and pelvis was performed following the standard protocol during bolus administration of intravenous contrast.  Contrast: OMNIPAQUE IOHEXOL 300 MG/ML IV SOLN  Comparison: 05/09/2009 and pelvic ultrasound dated 11/03/2010.  Findings: Multiple small bilateral renal calculi.  The largest is in the lower pole of the right kidney, measuring 8 mm in maximum diameter.  No ureteral or bladder calculi and no hydronephrosis.  Normal appearing appendix in the right lower abdomen.  Mildly progressive bilateral perinephric stranding.  Fundal uterine mass on the right measuring 4.9 x 4.9 cm in maximum dimensions on image number 69.  Low density in the endometrial and cervical canal with a widened cervical canal measuring 15.4 mm in diameter on sagittal image number 33.  There is a second fundal fibroid on that image measuring 3.4 cm in maximum diameter on image number 73.  The more inferior fundal fibroid is causing superior displacement and bowing of the overlying endometrial stripe.  The stripe is narrowed between these two fibroids.  The remainder of the endometrial canal is dilated, measuring 9.7 mm in thickness on sagittal image number 33.  Cholecystectomy clips are again  demonstrated.  A left lobe liver calcified granuloma is noted.  Unremarkable spleen, pancreas, adrenal glands and ovaries.  Scattered colonic diverticula without evidence of diverticulitis.  No enlarged lymph nodes.  Essentially clear lung bases.  Thoracic spine degenerative changes.    IMPRESSION:  1.  Dilated, low-density, mildly heterogeneous endometrial and cervical canals, suggesting the possibility of an obstructing cervical or endometrial malignancy. 2.  Fundal uterine fibroids with submucosal components. 3.  Multiple small, bilateral nonobstructing renal calculi.  Original Report Authenticated By: Darrol Angel, M.D.   CT scan reviewed as above. Patient evaluated. 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 She was told today that there was blood in her bed and the patient was uncertain where is coming from, assumed to be vaginal bleeding. She has some pelvic discomfort. Given CT results and vaginal bleeding, there is elevated concern for malignancy  Case discussed as above with Dr. Cherly Hensen, on call for GYN. She recommends close outpatient followup in GYN clinic. Patient is agreeable to plan.  No active vaginal bleeding. Her abdomen is soft, nontender and nondistended. She is pain-free on recheck.  Reliable historian stable for discharge home and followup as above  Andrea Nielsen, MD 03/19/11 [phone]

## 2011-03-19 NOTE — ED Notes (Signed)
D/c instructions reviewed w/ pt - pt denies any further questions or concerns at present.   

## 2011-04-08 ENCOUNTER — Emergency Department (HOSPITAL_COMMUNITY)
Admission: EM | Admit: 2011-04-08 | Discharge: 2011-04-08 | Disposition: A | Discharge status: Home / Self Care | Payer: Medicare Other | Attending: Emergency Medicine | Admitting: Emergency Medicine

## 2011-04-08 DIAGNOSIS — I10 Essential (primary) hypertension: Secondary | ICD-10-CM | POA: Insufficient documentation

## 2011-04-08 DIAGNOSIS — R32 Unspecified urinary incontinence: Secondary | ICD-10-CM | POA: Insufficient documentation

## 2011-04-08 DIAGNOSIS — N390 Urinary tract infection, site not specified: Secondary | ICD-10-CM

## 2011-04-08 DIAGNOSIS — E119 Type 2 diabetes mellitus without complications: Secondary | ICD-10-CM | POA: Insufficient documentation

## 2011-04-08 DIAGNOSIS — H548 Legal blindness, as defined in USA: Secondary | ICD-10-CM | POA: Insufficient documentation

## 2011-04-08 DIAGNOSIS — Z79899 Other long term (current) drug therapy: Secondary | ICD-10-CM | POA: Insufficient documentation

## 2011-04-08 LAB — URINALYSIS, ROUTINE W REFLEX MICROSCOPIC
Bilirubin Urine: NEGATIVE
Glucose, UA: 100 mg/dL — AB
Ketones, ur: NEGATIVE mg/dL
Nitrite: NEGATIVE
Protein, ur: 30 mg/dL — AB
Specific Gravity, Urine: 1.01 (ref 1.005–1.030)
Urobilinogen, UA: 0.2 mg/dL (ref 0.0–1.0)
pH: 7.5 (ref 5.0–8.0)

## 2011-04-08 LAB — URINE MICROSCOPIC-ADD ON

## 2011-04-08 MED ORDER — CEFTRIAXONE SODIUM 1 G IJ SOLR
1.0000 g | Freq: Once | INTRAMUSCULAR | Status: AC
  Administered 2011-04-08: 1 g via INTRAMUSCULAR
  Filled 2011-04-08: qty 10

## 2011-04-08 MED ORDER — PHENAZOPYRIDINE HCL 200 MG PO TABS: 200.0000 mg | ORAL_TABLET | Freq: Three times a day (TID) | ORAL | Status: AC

## 2011-04-08 MED ORDER — CEPHALEXIN 500 MG PO CAPS: 500.0000 mg | ORAL_CAPSULE | Freq: Four times a day (QID) | ORAL | Status: DC

## 2011-04-08 MED ORDER — PHENAZOPYRIDINE HCL 200 MG PO TABS: 200.0000 mg | ORAL_TABLET | Freq: Three times a day (TID) | ORAL | Status: DC

## 2011-04-08 MED ORDER — CEPHALEXIN 500 MG PO CAPS: 500.0000 mg | ORAL_CAPSULE | Freq: Four times a day (QID) | ORAL | Status: AC

## 2011-04-08 NOTE — Discharge Instructions (Signed)
Take antibiotic as prescribed. You can try the pyridium for the burning sensation.  Follow up with the urologist for recurrent UTIs.  Please return to the ER if you develop temp greater than 100.5, severe belly or low back pain or uncontrolled vomiting.  Urinary Tract Infection Infections of the urinary tract can start in several places. A bladder infection (cystitis), a kidney infection (pyelonephritis), and a prostate infection (prostatitis) are different types of urinary tract infections (UTIs). They usually get better if treated with medicines (antibiotics) that kill germs. Take all the medicine until it is gone. You or your child may feel better in a few days, but TAKE ALL MEDICINE or the infection may not respond and may become more difficult to treat. HOME CARE INSTRUCTIONS   Drink enough water and fluids to keep the urine clear or pale yellow. Cranberry juice is especially recommended, in addition to large amounts of water.   Avoid caffeine, tea, and carbonated beverages. They tend to irritate the bladder.   Alcohol may irritate the prostate.   Only take over-the-counter or prescription medicines for pain, discomfort, or fever as directed by your caregiver.  To prevent further infections:  Empty the bladder often. Avoid holding urine for long periods of time.   After a bowel movement, women should cleanse from front to back. Use each tissue only once.   Empty the bladder before and after sexual intercourse.  FINDING OUT THE RESULTS OF YOUR TEST Not all test results are available during your visit. If your or your child's test results are not back during the visit, make an appointment with your caregiver to find out the results. Do not assume everything is normal if you have not heard from your caregiver or the medical facility. It is important for you to follow up on all test results. SEEK MEDICAL CARE IF:   There is back pain.   Your baby is older than 3 months with a rectal  temperature of 100.5 F (38.1 C) or higher for more than 1 day.   Your or your child's problems (symptoms) are no better in 3 days. Return sooner if you or your child is getting worse.  SEEK IMMEDIATE MEDICAL CARE IF:   There is severe back pain or lower abdominal pain.   You or your child develops chills.   You have a fever.   Your baby is older than 3 months with a rectal temperature of 102 F (38.9 C) or higher.   Your baby is 26 months old or younger with a rectal temperature of 100.4 F (38 C) or higher.   There is nausea or vomiting.   There is continued burning or discomfort with urination.  MAKE SURE YOU:   Understand these instructions.   Will watch your condition.   Will get help right away if you are not doing well or get worse.  Document Released: 10/25/2004 Document Revised: 01/04/2011 Document Reviewed: 05/30/2006 [employer] Patient Information 2012 [place], [place].

## 2011-04-08 NOTE — ED Notes (Signed)
[facility] here to transport patient 

## 2011-04-08 NOTE — ED Provider Notes (Signed)
History     CSN: [id number]  Arrival date & time 04/08/11  1248   First MD Initiated Contact with Patient 04/08/11 1304      Chief Complaint  Patient presents with  . Urinary Retention    (Consider location/radiation/quality/duration/timing/severity/associated sxs/prior treatment) HPI History provided by pt.   Pt presents w/ constant urinary incontinence x 1 week.  Does not feel the urine coming and has to wear Depends.  Associated w/ dysuria, malodorous urine and mild right lower abdominal pain.  No associated fever, N/V, low back pain.  Has not had any recent injuries to her back.  Has h/o chronic UTIs, all of which have presented similarly.   Past Medical History  Diagnosis Date  . Hypertension   . Diabetes mellitus   . Blindness     No past surgical history on file.  No family history on file.  History  Substance Use Topics  . Smoking status: Never Smoker   . Smokeless tobacco: Not on file  . Alcohol Use: No    OB History    Grav Para Term Preterm Abortions TAB SAB Ect Mult Living                  Review of Systems  All other systems reviewed and are negative.    Allergies  Review of patient's allergies indicates no known allergies.  Home Medications   Current Outpatient Rx  Name Route Sig Dispense Refill  . AMLODIPINE BESYLATE 2.5 MG PO TABS Oral Take 2.5 mg by mouth daily.      . ASPIRIN EC 325 MG PO TBEC Oral Take 325 mg by mouth daily.      Marland Kitchen GLIPIZIDE 10 MG PO TABS Oral Take 10 mg by mouth 2 (two) times daily before a meal.      . LISINOPRIL 20 MG PO TABS Oral Take 20 mg by mouth daily.        BP 172/63  Pulse 77  Temp 99.1 F (37.3 C)  Resp 20  SpO2 93%  Physical Exam  Nursing note and vitals reviewed. Constitutional: She is oriented to person, place, and time. She appears well-developed and well-nourished. No distress.       Pt wearing sunglasses.  She is legally blind.   HENT:  Head: Normocephalic and atraumatic.  Eyes:       Normal  appearance  Neck: Normal range of motion.  Cardiovascular: Normal rate and regular rhythm.   Pulmonary/Chest: Effort normal and breath sounds normal.  Abdominal: Soft. Bowel sounds are normal. She exhibits no distension and no mass. There is no tenderness. There is no rebound and no guarding.       Obese. No CVA tenderness  Neurological: She is alert and oriented to person, place, and time.  Skin: Skin is warm and dry. No rash noted.  Psychiatric: She has a normal mood and affect. Her behavior is normal.    ED Course  Procedures (including critical care time)  Labs Reviewed  URINALYSIS, ROUTINE W REFLEX MICROSCOPIC - Abnormal; Notable for the following:    APPearance TURBID (*)    Glucose, UA 100 (*)    Hgb urine dipstick MODERATE (*)    Protein, ur 30 (*)    Leukocytes, UA LARGE (*)    All other components within normal limits  URINE MICROSCOPIC-ADD ON   No results found.   1. Urinary tract infection       MDM  [phone] F presents w/ c/o urinary incontinence.  Has had multiple times in the past in association w/ UTI.  Denies low back pain/injury and no sx suggestive of pyelo.  U/A pending.    U/A pos for infection.  Pt received 1g IM rocephin and d/c'd home w/ keflex and pyridium.  Referred to Urology for recurrent infections.  Return precautions discussed.         Arie Sabina Crouse, [place] 04/08/11 1537

## 2011-04-08 NOTE — ED Notes (Signed)
EMS called to [facility].  Found patient sitting in chair. Patient states she has not been able to hold her urine for over a last month Patient denies pain.

## 2011-04-08 NOTE — ED Notes (Signed)
Pt given medication as orders, [facility] called to transport back to assisted living

## 2011-04-09 NOTE — ED Provider Notes (Signed)
Medical screening examination/treatment/procedure(s) were performed by non-physician practitioner and as supervising physician I was immediately available for consultation/collaboration.   Laray Anger, DO 04/09/11 1059

## 2011-05-09 ENCOUNTER — Emergency Department (HOSPITAL_COMMUNITY)
Admission: EM | Admit: 2011-05-09 | Discharge: 2011-05-09 | Disposition: A | Discharge status: Home / Self Care | Payer: Medicare Other | Attending: Emergency Medicine | Admitting: Emergency Medicine

## 2011-05-09 ENCOUNTER — Emergency Department (HOSPITAL_COMMUNITY): Payer: Medicare Other

## 2011-05-09 ENCOUNTER — Encounter (HOSPITAL_COMMUNITY): Payer: Self-pay | Admitting: Emergency Medicine

## 2011-05-09 DIAGNOSIS — Z8673 Personal history of transient ischemic attack (TIA), and cerebral infarction without residual deficits: Secondary | ICD-10-CM | POA: Insufficient documentation

## 2011-05-09 DIAGNOSIS — M25569 Pain in unspecified knee: Secondary | ICD-10-CM | POA: Insufficient documentation

## 2011-05-09 DIAGNOSIS — W010XXA Fall on same level from slipping, tripping and stumbling without subsequent striking against object, initial encounter: Secondary | ICD-10-CM | POA: Insufficient documentation

## 2011-05-09 DIAGNOSIS — I10 Essential (primary) hypertension: Secondary | ICD-10-CM | POA: Insufficient documentation

## 2011-05-09 DIAGNOSIS — S8391XA Sprain of unspecified site of right knee, initial encounter: Secondary | ICD-10-CM

## 2011-05-09 DIAGNOSIS — IMO0002 Reserved for concepts with insufficient information to code with codable children: Secondary | ICD-10-CM | POA: Insufficient documentation

## 2011-05-09 DIAGNOSIS — E119 Type 2 diabetes mellitus without complications: Secondary | ICD-10-CM | POA: Insufficient documentation

## 2011-05-09 DIAGNOSIS — N390 Urinary tract infection, site not specified: Secondary | ICD-10-CM | POA: Insufficient documentation

## 2011-05-09 HISTORY — DX: Transient cerebral ischemic attack, unspecified: G45.9

## 2011-05-09 HISTORY — DX: Anemia, unspecified: D64.9

## 2011-05-09 LAB — URINALYSIS, ROUTINE W REFLEX MICROSCOPIC
Ketones, ur: NEGATIVE mg/dL
Nitrite: POSITIVE — AB
Protein, ur: 100 mg/dL — AB
pH: 5.5 (ref 5.0–8.0)

## 2011-05-09 LAB — URINE MICROSCOPIC-ADD ON

## 2011-05-09 MED ORDER — CEPHALEXIN 500 MG PO CAPS: 500.0000 mg | ORAL_CAPSULE | Freq: Four times a day (QID) | ORAL | Status: AC

## 2011-05-09 NOTE — ED Notes (Signed)
Patient discharged called [facility] for transportation.

## 2011-05-09 NOTE — Discharge Instructions (Signed)
Joint Sprain A sprain is a tear or stretch in the ligaments that hold a joint together. Severe sprains may need as long as 3-6 weeks of immobilization and/or exercises to heal completely. Sprained joints should be rested and protected. If not, they can become unstable and prone to re-injury. Proper treatment can reduce your pain, shorten the period of disability, and reduce the risk of repeated injuries. TREATMENT   Rest and elevate the injured joint to reduce pain and swelling.   Apply ice packs to the injury for 20-30 minutes every 2-3 hours for the next 2-3 days.   Keep the injury wrapped in a compression bandage or splint as long as the joint is painful or as instructed by your caregiver.   Do not use the injured joint until it is completely healed to prevent re-injury and chronic instability. Follow the instructions of your caregiver.   Long-term sprain management may require exercises and/or treatment by a physical therapist. Taping or special braces may help stabilize the joint until it is completely better.  SEEK MEDICAL CARE IF:   You develop increased pain or swelling of the joint.   You develop increasing redness and warmth of the joint.   You develop a fever.   It becomes stiff.   Your hand or foot gets cold or numb.  Document Released: 02/23/2004 Document Revised: 01/04/2011 Document Reviewed: 02/02/2008 [facility] Patient Information 2012 [place], [place]. Urinary Tract Infection Infections of the urinary tract can start in several places. A bladder infection (cystitis), a kidney infection (pyelonephritis), and a prostate infection (prostatitis) are different types of urinary tract infections (UTIs). They usually get better if treated with medicines (antibiotics) that kill germs. Take all the medicine until it is gone. You or your child may feel better in a few days, but TAKE ALL MEDICINE or the infection may not respond and may become more difficult to treat. HOME CARE  INSTRUCTIONS   Drink enough water and fluids to keep the urine clear or pale yellow. Cranberry juice is especially recommended, in addition to large amounts of water.   Avoid caffeine, tea, and carbonated beverages. They tend to irritate the bladder.   Alcohol may irritate the prostate.   Only take over-the-counter or prescription medicines for pain, discomfort, or fever as directed by your caregiver.  To prevent further infections:  Empty the bladder often. Avoid holding urine for long periods of time.   After a bowel movement, women should cleanse from front to back. Use each tissue only once.   Empty the bladder before and after sexual intercourse.  FINDING OUT THE RESULTS OF YOUR TEST Not all test results are available during your visit. If your or your child's test results are not back during the visit, make an appointment with your caregiver to find out the results. Do not assume everything is normal if you have not heard from your caregiver or the medical facility. It is important for you to follow up on all test results. SEEK MEDICAL CARE IF:   There is back pain.   Your baby is older than 3 months with a rectal temperature of 100.5 F (38.1 C) or higher for more than 1 day.   Your or your child's problems (symptoms) are no better in 3 days. Return sooner if you or your child is getting worse.  SEEK IMMEDIATE MEDICAL CARE IF:   There is severe back pain or lower abdominal pain.   You or your child develops chills.   You have  a fever.   Your baby is older than 3 months with a rectal temperature of 102 F (38.9 C) or higher.   Your baby is 53 months old or younger with a rectal temperature of 100.4 F (38 C) or higher.   There is nausea or vomiting.   There is continued burning or discomfort with urination.  MAKE SURE YOU:   Understand these instructions.   Will watch your condition.   Will get help right away if you are not doing well or get worse.  Document  Released: 10/25/2004 Document Revised: 01/04/2011 Document Reviewed: 05/30/2006 [facility] Patient Information 2012 [place], [place].

## 2011-05-09 NOTE — ED Notes (Signed)
Patient states moving from wheelchair to bed fell onto right knee.  Pain continued today upon movement.  At rest pain 0/10 with movement 8/10 achy dull full range of motion.  Pedal pulse +2 bilateral full sensation.  Airway intact bilateral equal chest rise and fall. Ax4. Patient states has chronic UTI's and take a water pill and urinates frequently with urine having a strong odor.

## 2011-05-09 NOTE — ED Notes (Signed)
Onset one day ago patient moving from wheelchair to bed fell onto right knee.  Patient lives in an independent living facility recommended patient been seen by Doctor.  EMS brought patient to ED states her room had an odor possible UTI.  Pain 5/10 achy knee pain.

## 2011-05-09 NOTE — ED Provider Notes (Signed)
History     CSN: [id number]  Arrival date & time 05/09/11  1340   First MD Initiated Contact with Patient 05/09/11 1345      Chief Complaint  Patient presents with  . Knee Pain    (Consider location/radiation/quality/duration/timing/severity/associated sxs/prior treatment) Patient is a 71 y.o. female presenting with knee pain. The history is provided by the patient.  Knee Pain   Patient fell 2 days ago onto her right knee. States she lost her balance. No loss of consciousness. Normally does use a walker to ambulate. Also complains of UTI symptoms. No fever noted. She lives in an assisted living facility. Patient was encouraged to come in for evaluation Past Medical History  Diagnosis Date  . Hypertension   . Diabetes mellitus   . Blindness   . TIA (transient ischemic attack)   . Anemia     Past Surgical History  Procedure Date  . Gallbladder surgery     No family history on file.  History  Substance Use Topics  . Smoking status: Never Smoker   . Smokeless tobacco: Not on file  . Alcohol Use: No    OB History    Grav Para Term Preterm Abortions TAB SAB Ect Mult Living                  Review of Systems  All other systems reviewed and are negative.    Allergies  Review of patient's allergies indicates no known allergies.  Home Medications   Current Outpatient Rx  Name Route Sig Dispense Refill  . AMLODIPINE BESYLATE 2.5 MG PO TABS Oral Take 2.5 mg by mouth daily.      . ASPIRIN EC 325 MG PO TBEC Oral Take 325 mg by mouth daily.      Marland Kitchen GLIPIZIDE ER 10 MG PO TB24 Oral Take 10 mg by mouth daily.    Marland Kitchen LISINOPRIL 20 MG PO TABS Oral Take 20 mg by mouth daily.        BP 156/87  Pulse 80  Temp(Src) 98.2 F (36.8 C) (Oral)  Resp 20  SpO2 100%  Physical Exam  Nursing note and vitals reviewed. Constitutional: She is oriented to person, place, and time. She appears well-developed and well-nourished.  Non-toxic appearance. No distress.  HENT:  Head:  Normocephalic and atraumatic.  Eyes: Conjunctivae, EOM and lids are normal. Pupils are equal, round, and reactive to light.  Neck: Normal range of motion. Neck supple. No tracheal deviation present. No mass present.  Cardiovascular: Normal rate, regular rhythm and normal heart sounds.  Exam reveals no gallop.   No murmur heard. Pulmonary/Chest: Effort normal and breath sounds normal. No stridor. No respiratory distress. She has no decreased breath sounds. She has no wheezes. She has no rhonchi. She has no rales.  Abdominal: Soft. Normal appearance and bowel sounds are normal. She exhibits no distension. There is no tenderness. There is no rebound and no CVA tenderness.  Musculoskeletal: Normal range of motion. She exhibits no edema and no tenderness.       Right knee: She exhibits no swelling and no effusion.       Legs: Neurological: She is alert and oriented to person, place, and time. She has normal strength. No cranial nerve deficit or sensory deficit. GCS eye subscore is 4. GCS verbal subscore is 5. GCS motor subscore is 6.  Skin: Skin is warm and dry. No abrasion and no rash noted.  Psychiatric: She has a normal mood and affect. Her speech  is normal and behavior is normal.    ED Course  Procedures (including critical care time)   Labs Reviewed  URINALYSIS, ROUTINE W REFLEX MICROSCOPIC  URINE CULTURE   No results found.   No diagnosis found.    MDM  Patient to be treated for her UTI and for her right knee sprain        Toy Baker, MD 05/09/11 1610

## 2011-05-11 LAB — URINE CULTURE

## 2011-05-12 NOTE — ED Notes (Addendum)
+   Urine Chart sent to EDP office for review. 

## 2011-05-17 NOTE — ED Notes (Signed)
Chart back from EDP office; treatment appropriate per [facility]

## 2011-06-12 ENCOUNTER — Encounter (HOSPITAL_COMMUNITY): Payer: Self-pay | Admitting: Emergency Medicine

## 2011-06-12 ENCOUNTER — Emergency Department (HOSPITAL_COMMUNITY)
Admission: EM | Admit: 2011-06-12 | Discharge: 2011-06-13 | Disposition: A | Discharge status: Home / Self Care | Payer: Medicare Other | Attending: Emergency Medicine | Admitting: Emergency Medicine

## 2011-06-12 DIAGNOSIS — Z79899 Other long term (current) drug therapy: Secondary | ICD-10-CM | POA: Insufficient documentation

## 2011-06-12 DIAGNOSIS — Z8673 Personal history of transient ischemic attack (TIA), and cerebral infarction without residual deficits: Secondary | ICD-10-CM | POA: Insufficient documentation

## 2011-06-12 DIAGNOSIS — IMO0002 Reserved for concepts with insufficient information to code with codable children: Secondary | ICD-10-CM | POA: Insufficient documentation

## 2011-06-12 DIAGNOSIS — S99929A Unspecified injury of unspecified foot, initial encounter: Secondary | ICD-10-CM | POA: Insufficient documentation

## 2011-06-12 DIAGNOSIS — M1710 Unilateral primary osteoarthritis, unspecified knee: Secondary | ICD-10-CM | POA: Insufficient documentation

## 2011-06-12 DIAGNOSIS — I10 Essential (primary) hypertension: Secondary | ICD-10-CM | POA: Insufficient documentation

## 2011-06-12 DIAGNOSIS — E119 Type 2 diabetes mellitus without complications: Secondary | ICD-10-CM | POA: Insufficient documentation

## 2011-06-12 DIAGNOSIS — W050XXA Fall from non-moving wheelchair, initial encounter: Secondary | ICD-10-CM | POA: Insufficient documentation

## 2011-06-12 DIAGNOSIS — M25569 Pain in unspecified knee: Secondary | ICD-10-CM | POA: Insufficient documentation

## 2011-06-12 DIAGNOSIS — S8990XA Unspecified injury of unspecified lower leg, initial encounter: Secondary | ICD-10-CM | POA: Insufficient documentation

## 2011-06-12 DIAGNOSIS — S8992XA Unspecified injury of left lower leg, initial encounter: Secondary | ICD-10-CM

## 2011-06-12 DIAGNOSIS — Z7982 Long term (current) use of aspirin: Secondary | ICD-10-CM | POA: Insufficient documentation

## 2011-06-12 DIAGNOSIS — M25469 Effusion, unspecified knee: Secondary | ICD-10-CM | POA: Insufficient documentation

## 2011-06-12 DIAGNOSIS — M1990 Unspecified osteoarthritis, unspecified site: Secondary | ICD-10-CM

## 2011-06-12 MED ORDER — HYDROCODONE-ACETAMINOPHEN 5-325 MG PO TABS
1.0000 | ORAL_TABLET | Freq: Once | ORAL | Status: AC
  Administered 2011-06-13: 1 via ORAL
  Filled 2011-06-12: qty 1

## 2011-06-12 NOTE — ED Notes (Signed)
GMW:[id number]<[id number]> Expected date:06/12/11<BR> Expected time:<BR> Means of arrival:<BR> Comments:<BR> EMS 212 gC- fall, elevated cbg

## 2011-06-12 NOTE — ED Notes (Signed)
Brought in by EMS from home with c/o fall and knee pain. Pt reports that her "left leg gave out" when trying to get in bed tonight and fell. Pt reports that she has chronic knee pain, states "it's already hurting before I fell"; denies worsening of knee pain after fall. Noted redness and swelling to both knees---ROM to BLE adequate.

## 2011-06-12 NOTE — ED Notes (Signed)
Pt. Placed on 2liters 02 per RN, [name]. Pt. 02 level dropped to 87%.

## 2011-06-13 ENCOUNTER — Emergency Department (HOSPITAL_COMMUNITY): Payer: Medicare Other

## 2011-06-13 MED ORDER — HYDROCODONE-ACETAMINOPHEN 5-500 MG PO TABS: 1.0000 | ORAL_TABLET | Freq: Three times a day (TID) | ORAL | Status: AC | PRN

## 2011-06-13 NOTE — ED Provider Notes (Addendum)
History     CSN: [id number]  Arrival date & time 06/12/11  2235   First MD Initiated Contact with Patient 06/12/11 2304      Chief Complaint  Patient presents with  . Fall  . Knee Pain    (Consider location/radiation/quality/duration/timing/severity/associated sxs/prior treatment) HPI History provided by patient. Resides in assisted living. She is blind. She uses a wheelchair and a walker at home. Tonight she is getting out of her wheelchair to get into bed in her left knee gave out. She fell and hurt her left knee. She denies any her head or hurting her neck or any other injury. She has known knee problems and is requesting something for pain. No fevers or chills. No nausea vomiting. She has pain all of the time but is worse tonight after falling. Moderate severity. Pain is sharp in quality and not radiating from left knee. No known alleviating factors. Past Medical History  Diagnosis Date  . Hypertension   . Diabetes mellitus   . Blindness   . TIA (transient ischemic attack)   . Anemia     Past Surgical History  Procedure Date  . Gallbladder surgery     History reviewed. No pertinent family history.  History  Substance Use Topics  . Smoking status: Never Smoker   . Smokeless tobacco: Not on file  . Alcohol Use: No    OB History    Grav Para Term Preterm Abortions TAB SAB Ect Mult Living                  Review of Systems  Constitutional: Negative for fever and chills.  HENT: Negative for neck pain and neck stiffness.   Eyes: Negative for pain.  Respiratory: Negative for shortness of breath.   Cardiovascular: Negative for chest pain.  Gastrointestinal: Negative for vomiting and abdominal pain.  Genitourinary: Negative for dysuria.  Musculoskeletal: Negative for back pain.  Skin: Negative for rash.  Neurological: Negative for headaches.  All other systems reviewed and are negative.    Allergies  Review of patient's allergies indicates no known  allergies.  Home Medications   Current Outpatient Rx  Name Route Sig Dispense Refill  . AMLODIPINE BESYLATE 2.5 MG PO TABS Oral Take 2.5 mg by mouth daily.      . ASPIRIN EC 325 MG PO TBEC Oral Take 325 mg by mouth daily.      Marland Kitchen GLIPIZIDE ER 10 MG PO TB24 Oral Take 10 mg by mouth 2 (two) times daily. Patient takes on one tablet    . LISINOPRIL 20 MG PO TABS Oral Take 20 mg by mouth daily.        BP 161/89  Pulse 82  Temp(Src) 98.4 F (36.9 C) (Oral)  Resp 18  SpO2 93%  Physical Exam  Constitutional: She is oriented to person, place, and time. She appears well-developed and well-nourished.  HENT:  Head: Normocephalic and atraumatic.  Eyes: Right eye exhibits no discharge. Left eye exhibits no discharge.  Neck: Trachea normal. Neck supple.  Cardiovascular: Normal rate, regular rhythm, S1 normal, S2 normal and normal pulses.     No systolic murmur is present   No diastolic murmur is present  Pulses:      Radial pulses are 2+ on the right side, and 2+ on the left side.  Pulmonary/Chest: Effort normal and breath sounds normal. She has no wheezes. She has no rhonchi. She has no rales. She exhibits no tenderness.  Abdominal: Soft. Normal appearance and bowel  sounds are normal. There is no tenderness. There is no CVA tenderness and negative Murphy's sign.  Musculoskeletal:       Left lower extremity: Tenderness with mild effusion over the patella. There is very superficial abrasion but no bleeding or laceration. Decreased range of motion secondary to pain. Distal neurovascular intact. Nontender ankle and hip. No calf tenderness. No cords and negative Homans.  Neurological: She is alert and oriented to person, place, and time. She has normal strength. No sensory deficit. GCS eye subscore is 4. GCS verbal subscore is 5. GCS motor subscore is 6.  Skin: Skin is warm and dry. No rash noted. She is not diaphoretic.  Psychiatric: Her speech is normal.       Cooperative and appropriate    ED  Course  Procedures (including critical care time)  Labs Reviewed - No data to display Dg Knee Complete 4 Views Left  06/13/2011  *RADIOLOGY REPORT*  Clinical Data: Fall.  Knee pain and swelling.  LEFT KNEE - COMPLETE 4+ VIEW  Comparison: None.  Findings: Prominent articular space narrowing is present in the medial compartment with prominent associated spurring.  Severe patellofemoral spurring is present with a small knee effusion.  In comparison, the lateral compartmental degenerative findings are minimal.  Vascular calcifications noted.  No discrete fracture is observed. Spurring of the tibial spine noted.  IMPRESSION:  1.  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  Mild osteoarthritis in the lateral compartment. 2.  Small knee effusion. 3.  Atherosclerosis.  Original Report Authenticated By: Dellia Cloud, M.D.      Ice. Hydrocodone. X-ray obtained and reviewed as above.   MDM   Left knee pain and fall with known history of severe osteoarthritis an x-ray that demonstrates the same again. Pain medications provided with orthopedic referral. Patient stable for discharge home.        Sunnie Nielsen, MD 06/13/11 1610  Sunnie Nielsen, MD 06/13/11 0120

## 2011-06-13 NOTE — Discharge Instructions (Signed)
Arthritis Rest, apply ice and keep your left knee elevated. Take medications as needed for pain. Follow up with your physician for recheck in the clinic   Arthritis is inflammation of a joint. This usually means pain, redness, warmth or swelling are present. One or more joints may be involved. There are a number of types of arthritis. Your caregiver may not be able to tell what type of arthritis you have right away. CAUSES  The most common cause of arthritis is the wear and tear on the joint (osteoarthritis). This causes damage to the cartilage, which can break down over time. The knees, hips, back and neck are most often affected by this type of arthritis. Other types of arthritis and common causes of joint pain include:  Sprains and other injuries near the joint. Sometimes minor sprains and injuries cause pain and swelling that develop hours later.   Rheumatoid arthritis. This affects hands, feet and knees. It usually affects both sides of your body at the same time. It is often associated with chronic ailments, fever, weight loss and general weakness.   Crystal arthritis. Gout and pseudo gout can cause occasional acute severe pain, redness and swelling in the foot, ankle, or knee.   Infectious arthritis. Bacteria can get into a joint through a break in overlying skin. This can cause infection of the joint. Bacteria and viruses can also spread through the blood and affect your joints.   Drug, infectious and allergy reactions. Sometimes joints can become mildly painful and slightly swollen with these types of illnesses.  SYMPTOMS   Pain is the main symptom.   Your joint or joints can also be red, swollen and warm or hot to the touch.   You may have a fever with certain types of arthritis, or even feel overall ill.   The joint with arthritis will hurt with movement. Stiffness is present with some types of arthritis.  DIAGNOSIS  Your caregiver will suspect arthritis based on your  description of your symptoms and on your exam. Testing may be needed to find the type of arthritis:  Blood and sometimes urine tests.   X-ray tests and sometimes CT or MRI scans.   Removal of fluid from the joint (arthrocentesis) is done to check for bacteria, crystals or other causes. Your caregiver (or a specialist) will numb the area over the joint with a local anesthetic, and use a needle to remove joint fluid for examination. This procedure is only minimally uncomfortable.   Even with these tests, your caregiver may not be able to tell what kind of arthritis you have. Consultation with a specialist (rheumatologist) may be helpful.  TREATMENT  Your caregiver will discuss with you treatment specific to your type of arthritis. If the specific type cannot be determined, then the following general recommendations may apply. Treatment of severe joint pain includes:  Rest.   Elevation.   Anti-inflammatory medication (for example, ibuprofen) may be prescribed. Avoiding activities that cause increased pain.   Only take over-the-counter or prescription medicines for pain and discomfort as recommended by your caregiver.   Cold packs over an inflamed joint may be used for 10 to 15 minutes every hour. Hot packs sometimes feel better, but do not use overnight. Do not use hot packs if you are diabetic without your caregiver's permission.   A cortisone shot into arthritic joints may help reduce pain and swelling.   Any acute arthritis that gets worse over the next 1 to 2 days needs to be  looked at to be sure there is no joint infection.  Long-term arthritis treatment involves modifying activities and lifestyle to reduce joint stress jarring. This can include weight loss. Also, exercise is needed to nourish the joint cartilage and remove waste. This helps keep the muscles around the joint strong. HOME CARE INSTRUCTIONS   Do not take aspirin to relieve pain if gout is suspected. This elevates uric  acid levels.   Only take over-the-counter or prescription medicines for pain, discomfort or fever as directed by your caregiver.   Rest the joint as much as possible.   If your joint is swollen, keep it elevated.   Use crutches if the painful joint is in your leg.   Drinking plenty of fluids may help for certain types of arthritis.   Follow your caregiver's dietary instructions.   Try low-impact exercise such as:   Swimming.   Water aerobics.   Biking.   Walking.   Morning stiffness is often relieved by a warm shower.   Put your joints through regular range-of-motion.  SEEK MEDICAL CARE IF:   You do not feel better in 24 hours or are getting worse.   You have side effects to medications, or are not getting better with treatment.  SEEK IMMEDIATE MEDICAL CARE IF:   You have a fever.   You develop severe joint pain, swelling or redness.   Many joints are involved and become painful and swollen.   There is severe back pain and/or leg weakness.   You have loss of bowel or bladder control.

## 2011-06-22 ENCOUNTER — Inpatient Hospital Stay (HOSPITAL_COMMUNITY)
Admission: EM | Admit: 2011-06-22 | Discharge: 2011-06-29 | DRG: 193 | Disposition: A | Discharge status: Skilled Nursing Facility | Payer: Medicare Other | Attending: Internal Medicine | Admitting: Internal Medicine

## 2011-06-22 ENCOUNTER — Encounter (HOSPITAL_COMMUNITY): Payer: Self-pay | Admitting: Emergency Medicine

## 2011-06-22 ENCOUNTER — Inpatient Hospital Stay (HOSPITAL_COMMUNITY): Payer: Medicare Other

## 2011-06-22 ENCOUNTER — Emergency Department (HOSPITAL_COMMUNITY): Payer: Medicare Other

## 2011-06-22 DIAGNOSIS — E1165 Type 2 diabetes mellitus with hyperglycemia: Secondary | ICD-10-CM | POA: Diagnosis present

## 2011-06-22 DIAGNOSIS — K220 Achalasia of cardia: Secondary | ICD-10-CM

## 2011-06-22 DIAGNOSIS — D696 Thrombocytopenia, unspecified: Secondary | ICD-10-CM

## 2011-06-22 DIAGNOSIS — I452 Bifascicular block: Secondary | ICD-10-CM

## 2011-06-22 DIAGNOSIS — H548 Legal blindness, as defined in USA: Secondary | ICD-10-CM

## 2011-06-22 DIAGNOSIS — I10 Essential (primary) hypertension: Secondary | ICD-10-CM | POA: Diagnosis present

## 2011-06-22 DIAGNOSIS — I509 Heart failure, unspecified: Secondary | ICD-10-CM | POA: Diagnosis present

## 2011-06-22 DIAGNOSIS — M170 Bilateral primary osteoarthritis of knee: Secondary | ICD-10-CM | POA: Diagnosis present

## 2011-06-22 DIAGNOSIS — E119 Type 2 diabetes mellitus without complications: Secondary | ICD-10-CM

## 2011-06-22 DIAGNOSIS — E118 Type 2 diabetes mellitus with unspecified complications: Secondary | ICD-10-CM

## 2011-06-22 DIAGNOSIS — I5043 Acute on chronic combined systolic (congestive) and diastolic (congestive) heart failure: Secondary | ICD-10-CM | POA: Diagnosis present

## 2011-06-22 DIAGNOSIS — N3289 Other specified disorders of bladder: Secondary | ICD-10-CM

## 2011-06-22 DIAGNOSIS — N179 Acute kidney failure, unspecified: Secondary | ICD-10-CM | POA: Diagnosis present

## 2011-06-22 DIAGNOSIS — H543 Unqualified visual loss, both eyes: Secondary | ICD-10-CM | POA: Diagnosis present

## 2011-06-22 DIAGNOSIS — H547 Unspecified visual loss: Secondary | ICD-10-CM | POA: Diagnosis present

## 2011-06-22 DIAGNOSIS — Z9089 Acquired absence of other organs: Secondary | ICD-10-CM

## 2011-06-22 DIAGNOSIS — J189 Pneumonia, unspecified organism: Secondary | ICD-10-CM

## 2011-06-22 DIAGNOSIS — IMO0002 Reserved for concepts with insufficient information to code with codable children: Secondary | ICD-10-CM | POA: Diagnosis present

## 2011-06-22 DIAGNOSIS — R197 Diarrhea, unspecified: Secondary | ICD-10-CM

## 2011-06-22 DIAGNOSIS — M1710 Unilateral primary osteoarthritis, unspecified knee: Secondary | ICD-10-CM | POA: Diagnosis present

## 2011-06-22 DIAGNOSIS — R55 Syncope and collapse: Secondary | ICD-10-CM

## 2011-06-22 DIAGNOSIS — I6350 Cerebral infarction due to unspecified occlusion or stenosis of unspecified cerebral artery: Secondary | ICD-10-CM

## 2011-06-22 DIAGNOSIS — E1151 Type 2 diabetes mellitus with diabetic peripheral angiopathy without gangrene: Secondary | ICD-10-CM | POA: Diagnosis present

## 2011-06-22 LAB — BASIC METABOLIC PANEL
CO2: 26 mEq/L (ref 19–32)
Chloride: 106 mEq/L (ref 96–112)
Creatinine, Ser: 0.89 mg/dL (ref 0.50–1.10)

## 2011-06-22 LAB — CBC
Hemoglobin: 13 g/dL (ref 12.0–15.0)
MCV: 89.2 fL (ref 78.0–100.0)
Platelets: 349 10*3/uL (ref 150–400)
RBC: 4.72 MIL/uL (ref 3.87–5.11)
WBC: 6.5 10*3/uL (ref 4.0–10.5)

## 2011-06-22 LAB — D-DIMER, QUANTITATIVE: D-Dimer, Quant: 2.49 ug/mL-FEU — ABNORMAL HIGH (ref 0.00–0.48)

## 2011-06-22 MED ORDER — SODIUM CHLORIDE 0.9 % IV BOLUS (SEPSIS)
1000.0000 mL | Freq: Once | INTRAVENOUS | Status: AC
  Administered 2011-06-22: 1000 mL via INTRAVENOUS

## 2011-06-22 MED ORDER — DEXTROSE 5 % IV SOLN
500.0000 mg | Freq: Once | INTRAVENOUS | Status: AC
  Administered 2011-06-22: 500 mg via INTRAVENOUS
  Filled 2011-06-22: qty 500

## 2011-06-22 MED ORDER — DEXTROSE 5 % IV SOLN
1.0000 g | INTRAVENOUS | Status: DC
  Administered 2011-06-22: 1 g via INTRAVENOUS
  Filled 2011-06-22: qty 10

## 2011-06-22 MED ORDER — INSULIN ASPART 100 UNIT/ML ~~LOC~~ SOLN
0.0000 [IU] | Freq: Three times a day (TID) | SUBCUTANEOUS | Status: DC
  Administered 2011-06-23 (×2): 2 [IU] via SUBCUTANEOUS
  Administered 2011-06-24: 3 [IU] via SUBCUTANEOUS
  Administered 2011-06-24: 2 [IU] via SUBCUTANEOUS

## 2011-06-22 MED ORDER — IOHEXOL 300 MG/ML  SOLN
100.0000 mL | Freq: Once | INTRAMUSCULAR | Status: AC | PRN
  Administered 2011-06-22: 100 mL via INTRAVENOUS

## 2011-06-22 MED ORDER — DEXTROSE 5 % IV SOLN
1.0000 g | Freq: Once | INTRAVENOUS | Status: AC
  Administered 2011-06-22: 1 g via INTRAVENOUS
  Filled 2011-06-22: qty 10

## 2011-06-22 MED ORDER — SODIUM CHLORIDE 0.9 % IV SOLN
INTRAVENOUS | Status: AC
  Administered 2011-06-22: 19:00:00 via INTRAVENOUS

## 2011-06-22 NOTE — H&P (Signed)
[name] MRN: [id number] DOB/AGE: 05-16-1940 71 y.o. Primary Care Physician:MCPHERSON,BARBARA, MD, MD Admit date: 06/22/2011 Chief Complaint: .  Diarrhea  .  Shortness of Breath   HPI: 71 yr old F who gets most of her [facility] at [facility] comes in with   weakness and  3 days of diarrhea not associated with  nausea or vomiting. She has had a cough but denies shortness of breath. She denies fevers or chills. She reports she's become weak at home to the point where she's had difficulty getting out of bed and performing her activities of daily living. She's been wearing depends since his had difficulty getting to the bathroom. She's concerned about her ability to care for self at home. She denies abdominal pain,melena or hematochezia . She has no dysuria or urinary frequency. She denies flank pain. She has no headache. She denies unilateral weakness of her upper or lower extremities, She was hypoxic with sats in the 80" on RA with ambulation , When the patient was taken off of room air is noted that her O2 saturations were 87%. Chest x-ray was obtained which demonstrates what appears to be community acquired pneumonia. The patient will be given Rocephin and azithromycin and the patient will be admitted to the hospital.Pt states that she need placement in nursing home, and will be evicted soon from current placement.   She was recently in ED in MAY with Left knee pain and a  fall with known history of severe osteoarthritis  . Pain medications provided with orthopedic referral, and was discharged home .  Past Medical History  Diagnosis Date  . Hypertension   . Diabetes mellitus   . Blindness   . TIA (transient ischemic attack)   . Anemia     Past Surgical History  Procedure Date  . Gallbladder surgery   . Esophageal dilation     Prior to Admission medications   Medication Sig Start Date End Date Taking? Authorizing Provider  amLODipine (NORVASC) 2.5 MG tablet Take 2.5 mg by mouth daily.      Yes Historical Provider, MD  aspirin EC 325 MG tablet Take 325 mg by mouth daily.     Yes Historical Provider, MD  glipiZIDE (GLUCOTROL XL) 10 MG 24 hr tablet Take 10 mg by mouth 2 (two) times daily. Patient takes on one tablet   Yes Historical Provider, MD  ibuprofen (ADVIL,MOTRIN) 200 MG tablet Take 200 mg by mouth every 6 (six) hours as needed.   Yes Historical Provider, MD  lisinopril (PRINIVIL,ZESTRIL) 20 MG tablet Take 20 mg by mouth daily.     Yes Historical Provider, MD  HYDROcodone-acetaminophen (VICODIN) 5-500 MG per tablet Take 1 tablet by mouth every 8 (eight) hours as needed for pain. 06/13/11 06/23/11  Sunnie Nielsen, MD    Allergies: No Known Allergies  History reviewed. No pertinent family history.  Social History:  reports that she has never smoked. Her smokeless tobacco use includes Snuff. She reports that she does not drink alcohol or use illicit drugs.         WUJ:WJXBJY of Systems  Constitutional: Negative for fever and chills.  HENT: Negative for neck pain and neck stiffness.  Eyes: Negative for pain.  Respiratory: Negative for shortness of breath.  Cardiovascular: Negative for chest pain.  Gastrointestinal: Negative for vomiting and abdominal pain.  Genitourinary: Negative for dysuria.  Musculoskeletal: Negative for back pain.  Skin: Negative for rash.  Neurological: Negative for headaches.  All other systems reviewed and are negative.  PHYSICAL EXAM: Blood pressure 158/80, pulse 73, temperature 98.3 F (36.8 C), resp. rate 20, SpO2 96.00%. Nursing note and vitals reviewed.  Constitutional: She is oriented to person, place, and time. She appears well-developed and well-nourished. No distress.  HENT:  Head: Normocephalic and atraumatic.  Eyes: EOM are normal.  Neck: Normal range of motion.  Cardiovascular: Normal rate, regular rhythm and normal heart sounds.  Pulmonary/Chest: Effort normal and breath sounds normal.  Abdominal: Soft. She exhibits no  distension. There is no tenderness.  Musculoskeletal: Normal range of motion.  Neurological: She is alert and oriented to person, place, and time.  Skin: Skin is warm and dry.  Psychiatric: She has a normal mood and affect. Judgment normal.     No results found for this or any previous visit (from the past 240 hour(s)).   Results for orders placed during the hospital encounter of 06/22/11 (from the past 48 hour(s))  CBC     Status: Normal   Collection Time   06/22/11  1:15 PM      Component Value Range Comment   WBC 6.5  4.0 - 10.5 (K/uL)    RBC 4.72  3.87 - 5.11 (MIL/uL)    Hemoglobin 13.0  12.0 - 15.0 (g/dL)    HCT 95.2  84.1 - 32.4 (%)    MCV 89.2  78.0 - 100.0 (fL)    MCH 27.5  26.0 - 34.0 (pg)    MCHC 30.9  30.0 - 36.0 (g/dL)    RDW 40.1  02.7 - 25.3 (%)    Platelets 349  150 - 400 (K/uL)   BASIC METABOLIC PANEL     Status: Abnormal   Collection Time   06/22/11  1:15 PM      Component Value Range Comment   Sodium 141  135 - 145 (mEq/L)    Potassium 4.0  3.5 - 5.1 (mEq/L)    Chloride 106  96 - 112 (mEq/L)    CO2 26  19 - 32 (mEq/L)    Glucose, Bld 137 (*) 70 - 99 (mg/dL)    BUN 17  6 - 23 (mg/dL)    Creatinine, Ser 6.64  0.50 - 1.10 (mg/dL)    Calcium 8.3 (*) 8.4 - 10.5 (mg/dL)    GFR calc non Af Amer 65 (*) >90 (mL/min)    GFR calc Af Amer 75 (*) >90 (mL/min)     Dg Chest 2 View  06/22/2011  *RADIOLOGY REPORT*  Clinical Data: Short of breath.  CHEST - 2 VIEW  Comparison: 12/25/2008  Findings: Right lower lobe airspace disease is seen with lesser degree of infiltrate or atelectasis in the right upper lobe.  This is suspicious for pneumonia.  Left lung is grossly clear.  Heart size within normal limits allowing for low lung volumes and lordotic positioning peri  IMPRESSION: Right lower lobe consolidation and right upper lobe atelectasis versus infiltrate, suspicious for pneumonia.  Original Report Authenticated By: Danae Orleans, M.D.   Dg Knee Complete 4 Views  Left  06/13/2011  *RADIOLOGY REPORT*  Clinical Data: Fall.  Knee pain and swelling.  LEFT KNEE - COMPLETE 4+ VIEW  Comparison: None.  Findings: Prominent articular space narrowing is present in the medial compartment with prominent associated spurring.  Severe patellofemoral spurring is present with a small knee effusion.  In comparison, the lateral compartmental degenerative findings are minimal.  Vascular calcifications noted.  No discrete fracture is observed. Spurring of the tibial spine noted.  IMPRESSION:  1.  Severe  osteoarthritis in the patellofemoral joint and medial compartment.  Mild osteoarthritis in the lateral compartment. 2.  Small knee effusion. 3.  Atherosclerosis.  Original Report Authenticated By: Dellia Cloud, M.D.    Impression:  Principal Problem:  *PNA (pneumonia) Active Problems:  DM  BLINDNESS, BILATERAL  ESSENTIAL HYPERTENSION  CAP (community acquired pneumonia)     Plan: Admit to tele Agree with CDIFF PCR to evaluate for diarrhea Avelox , nebs ,  PT/OT eval due to fall, placement?\ Start SSI for diabetes  Social work consult-homeless  D-DIMER to further evaluate hypoxia, cycle enzymes , PROBNP, 2 D ECHO to R/O chf   Full code       [facility] 06/22/2011, 6:23 PM

## 2011-06-22 NOTE — ED Notes (Addendum)
Per EMS.  Pt from assistive living on [address]. Pt has been having diarrhea for past 3 days. Pt blind.  EMS called to help get pt into wheelchair. Pt had defecated on self and had to be cleaned up.  Pt states that she need placement in nursing home, and will be evicted soon from current placement.  Pt o2 sat 92 on 4L

## 2011-06-22 NOTE — ED Notes (Signed)
JYN:[id number]<AO> Expected date:<BR> Expected time:11:34 AM<BR> Means of arrival:<BR> Comments:<BR> [id number] - 71yoF Diarrhea x2 days

## 2011-06-22 NOTE — ED Provider Notes (Signed)
History     CSN: [id number]  Arrival date & time 06/22/11  1134   First MD Initiated Contact with Patient 06/22/11 1137      Chief Complaint  Patient presents with  . Diarrhea  . Shortness of Breath     The history is provided by the patient.   the patient reports there was weakness in 3 days of diarrhea without nausea or vomiting.  She has had a cough but denies shortness of breath.  She denies fevers or chills.  She reports she's become weak at home to the point where she's had difficulty getting out of bed and performing her activities of daily living.  She's been wearing depends since his had difficulty getting to the bathroom.  She's concerned about her ability to care for self at home.  She denies abdominal pain.  She has no dysuria or urinary frequency.  She denies flank pain.  She has no headache.  She denies unilateral weakness of her upper or lower extremities  Past Medical History  Diagnosis Date  . Hypertension   . Diabetes mellitus   . Blindness   . TIA (transient ischemic attack)   . Anemia     Past Surgical History  Procedure Date  . Gallbladder surgery     History reviewed. No pertinent family history.  History  Substance Use Topics  . Smoking status: Never Smoker   . Smokeless tobacco: Not on file  . Alcohol Use: No    OB History    Grav Para Term Preterm Abortions TAB SAB Ect Mult Living                  Review of Systems  All other systems reviewed and are negative.    Allergies  Review of patient's allergies indicates no known allergies.  Home Medications   Current Outpatient Rx  Name Route Sig Dispense Refill  . AMLODIPINE BESYLATE 2.5 MG PO TABS Oral Take 2.5 mg by mouth daily.      . ASPIRIN EC 325 MG PO TBEC Oral Take 325 mg by mouth daily.      Marland Kitchen GLIPIZIDE ER 10 MG PO TB24 Oral Take 10 mg by mouth 2 (two) times daily. Patient takes on one tablet    . IBUPROFEN 200 MG PO TABS Oral Take 200 mg by mouth every 6 (six) hours as needed.     Marland Kitchen LISINOPRIL 20 MG PO TABS Oral Take 20 mg by mouth daily.      Marland Kitchen HYDROCODONE-ACETAMINOPHEN 5-500 MG PO TABS Oral Take 1 tablet by mouth every 8 (eight) hours as needed for pain. 15 tablet 0    BP 168/90  Pulse 73  Temp 98.3 F (36.8 C)  Resp 24  SpO2 94%  Physical Exam  Nursing note and vitals reviewed. Constitutional: She is oriented to person, place, and time. She appears well-developed and well-nourished. No distress.  HENT:  Head: Normocephalic and atraumatic.  Eyes: EOM are normal.  Neck: Normal range of motion.  Cardiovascular: Normal rate, regular rhythm and normal heart sounds.   Pulmonary/Chest: Effort normal and breath sounds normal.  Abdominal: Soft. She exhibits no distension. There is no tenderness.  Musculoskeletal: Normal range of motion.  Neurological: She is alert and oriented to person, place, and time.  Skin: Skin is warm and dry.  Psychiatric: She has a normal mood and affect. Judgment normal.    ED Course  Procedures (including critical care time)  Labs Reviewed  BASIC METABOLIC PANEL -  Abnormal; Notable for the following:    Glucose, Bld 137 (*)    Calcium 8.3 (*)    GFR calc non Af Amer 65 (*)    GFR calc Af Amer 75 (*)    All other components within normal limits  CBC  CLOSTRIDIUM DIFFICILE BY PCR   Dg Chest 2 View  06/22/2011  *RADIOLOGY REPORT*  Clinical Data: Short of breath.  CHEST - 2 VIEW  Comparison: 12/25/2008  Findings: Right lower lobe airspace disease is seen with lesser degree of infiltrate or atelectasis in the right upper lobe.  This is suspicious for pneumonia.  Left lung is grossly clear.  Heart size within normal limits allowing for low lung volumes and lordotic positioning peri  IMPRESSION: Right lower lobe consolidation and right upper lobe atelectasis versus infiltrate, suspicious for pneumonia.  Original Report Authenticated By: Danae Orleans, M.D.     1. Diarrhea   2. community acquired pneumonia    MDM  Mild volume  depletion.  Patient was hydrated in the emergency department and feels somewhat better.  Her options saturation on arrival was 81%.  When the patient was taken off of room air is noted that her O2 saturations were 87%.  Chest x-ray was obtained which demonstrates what appears to be community acquired pneumonia.  The patient will be given Rocephin and azithromycin and the patient will be admitted to the hospital        Lyanne Co, MD 06/22/11 1654

## 2011-06-22 NOTE — ED Notes (Signed)
Per tech.  Pt has scabs over lower abd, buttocks and thighs.

## 2011-06-22 NOTE — ED Notes (Signed)
This Nurse already called report to Beatrix Shipper receiving Nurse . Pt. Is awaiting for IV team for IV access for CT scan of Chest. CT scan tech aware.

## 2011-06-22 NOTE — Progress Notes (Signed)
ED CM consulted by EDP, Campos, related to pt's home care services concerns.  CM spoke with pt who states she receives [facility] via [facility]) She and female staff member from [facility] disagreed on how long she could stay sitting in her wheelchair.  Pt reports calling [facility] to request staff to not return Reports support from female friend who assists her (blind) with cleaning home, errands, meals and ADLS. Pt reports concerns with being "reported for competency" States she would prefer to await conclusion related to this before having CM to offer assistance with [facility] or possible private duty Pt confirms she does not have medicaid services

## 2011-06-23 DIAGNOSIS — E1165 Type 2 diabetes mellitus with hyperglycemia: Secondary | ICD-10-CM

## 2011-06-23 DIAGNOSIS — H548 Legal blindness, as defined in USA: Secondary | ICD-10-CM

## 2011-06-23 DIAGNOSIS — D696 Thrombocytopenia, unspecified: Secondary | ICD-10-CM

## 2011-06-23 DIAGNOSIS — E118 Type 2 diabetes mellitus with unspecified complications: Secondary | ICD-10-CM

## 2011-06-23 LAB — GLUCOSE, CAPILLARY: Glucose-Capillary: 130 mg/dL — ABNORMAL HIGH (ref 70–99)

## 2011-06-23 LAB — COMPREHENSIVE METABOLIC PANEL
Albumin: 2.4 g/dL — ABNORMAL LOW (ref 3.5–5.2)
BUN: 13 mg/dL (ref 6–23)
Creatinine, Ser: 0.73 mg/dL (ref 0.50–1.10)
Total Protein: 6.2 g/dL (ref 6.0–8.3)

## 2011-06-23 LAB — CBC
HCT: 39.7 % (ref 36.0–46.0)
MCV: 88.8 fL (ref 78.0–100.0)
Platelets: 308 10*3/uL (ref 150–400)
RBC: 4.47 MIL/uL (ref 3.87–5.11)
WBC: 6.8 10*3/uL (ref 4.0–10.5)

## 2011-06-23 LAB — BILIRUBIN, DIRECT: Bilirubin, Direct: <0.1 mg/dL (ref 0.0–0.3)

## 2011-06-23 LAB — CLOSTRIDIUM DIFFICILE BY PCR: Toxigenic C. Difficile by PCR: NEGATIVE

## 2011-06-23 LAB — HEMOGLOBIN A1C: Mean Plasma Glucose: 232 mg/dL — ABNORMAL HIGH (ref <117)

## 2011-06-23 MED ORDER — DOCUSATE SODIUM 100 MG PO CAPS
100.0000 mg | ORAL_CAPSULE | Freq: Two times a day (BID) | ORAL | Status: DC
  Administered 2011-06-23 – 2011-06-29 (×10): 100 mg via ORAL
  Filled 2011-06-23 (×14): qty 1

## 2011-06-23 MED ORDER — FUROSEMIDE 20 MG PO TABS
20.0000 mg | ORAL_TABLET | Freq: Every day | ORAL | Status: DC
  Administered 2011-06-23 – 2011-06-24 (×2): 20 mg via ORAL
  Filled 2011-06-23 (×2): qty 1

## 2011-06-23 MED ORDER — IPRATROPIUM BROMIDE 0.02 % IN SOLN: 0.5000 mg | Freq: Four times a day (QID) | RESPIRATORY_TRACT | Status: DC

## 2011-06-23 MED ORDER — ALBUTEROL SULFATE (5 MG/ML) 0.5% IN NEBU: 2.5000 mg | INHALATION_SOLUTION | RESPIRATORY_TRACT | Status: DC | PRN

## 2011-06-23 MED ORDER — ENOXAPARIN SODIUM 60 MG/0.6ML ~~LOC~~ SOLN
60.0000 mg | SUBCUTANEOUS | Status: DC
  Administered 2011-06-23 – 2011-06-29 (×7): 60 mg via SUBCUTANEOUS
  Filled 2011-06-23 (×7): qty 0.6

## 2011-06-23 MED ORDER — HYDROCODONE-ACETAMINOPHEN 5-325 MG PO TABS
1.0000 | ORAL_TABLET | ORAL | Status: DC | PRN
  Administered 2011-06-23: 1 via ORAL
  Administered 2011-06-23 – 2011-06-28 (×6): 2 via ORAL
  Filled 2011-06-23 (×5): qty 2
  Filled 2011-06-23: qty 1
  Filled 2011-06-23: qty 2

## 2011-06-23 MED ORDER — ONDANSETRON HCL 4 MG PO TABS
4.0000 mg | ORAL_TABLET | Freq: Four times a day (QID) | ORAL | Status: DC | PRN
  Administered 2011-06-25 – 2011-06-26 (×3): 4 mg via ORAL
  Filled 2011-06-23 (×3): qty 1

## 2011-06-23 MED ORDER — SODIUM CHLORIDE 0.9 % IV SOLN
INTRAVENOUS | Status: DC
  Administered 2011-06-23: 06:00:00 via INTRAVENOUS

## 2011-06-23 MED ORDER — DEXTROSE 5 % IV SOLN: 1.0000 g | INTRAVENOUS | Status: DC

## 2011-06-23 MED ORDER — ACETAMINOPHEN 650 MG RE SUPP: 650.0000 mg | Freq: Four times a day (QID) | RECTAL | Status: DC | PRN

## 2011-06-23 MED ORDER — IPRATROPIUM BROMIDE 0.02 % IN SOLN: 0.5000 mg | RESPIRATORY_TRACT | Status: DC | PRN

## 2011-06-23 MED ORDER — ONDANSETRON HCL 4 MG/2ML IJ SOLN
4.0000 mg | Freq: Four times a day (QID) | INTRAMUSCULAR | Status: DC | PRN
  Administered 2011-06-24 – 2011-06-29 (×3): 4 mg via INTRAVENOUS
  Filled 2011-06-23 (×4): qty 2

## 2011-06-23 MED ORDER — AMLODIPINE BESYLATE 2.5 MG PO TABS
2.5000 mg | ORAL_TABLET | Freq: Every day | ORAL | Status: DC
  Administered 2011-06-23 – 2011-06-24 (×2): 2.5 mg via ORAL
  Filled 2011-06-23 (×3): qty 1

## 2011-06-23 MED ORDER — DEXTROSE 5 % IV SOLN
1.0000 g | INTRAVENOUS | Status: DC
  Administered 2011-06-23: 1 g via INTRAVENOUS
  Filled 2011-06-23 (×2): qty 10

## 2011-06-23 MED ORDER — ACETAMINOPHEN 325 MG PO TABS: 650.0000 mg | ORAL_TABLET | Freq: Four times a day (QID) | ORAL | Status: DC | PRN

## 2011-06-23 MED ORDER — ASPIRIN EC 325 MG PO TBEC
325.0000 mg | DELAYED_RELEASE_TABLET | Freq: Every day | ORAL | Status: DC
  Administered 2011-06-23 – 2011-06-29 (×7): 325 mg via ORAL
  Filled 2011-06-23 (×7): qty 1

## 2011-06-23 MED ORDER — GLIPIZIDE ER 10 MG PO TB24
10.0000 mg | ORAL_TABLET | Freq: Two times a day (BID) | ORAL | Status: DC
  Administered 2011-06-23 – 2011-06-26 (×6): 10 mg via ORAL
  Filled 2011-06-23 (×12): qty 1

## 2011-06-23 NOTE — Progress Notes (Signed)
Subjective: Feels better Or shortness of breath or chest pain overnight   Objective: Vital signs in last 24 hours: Filed Vitals:   06/22/11 1945 06/22/11 2312 06/23/11 0031 06/23/11 0600  BP: 154/88 148/86 151/90 157/73  Pulse: 69 67 64 71  Temp: 97.9 F (36.6 C) 98.8 F (37.1 C) 97.2 F (36.2 C) 97.3 F (36.3 C)  TempSrc: Oral Oral Oral Oral  Resp: 17 19 19 19   Height:   5\' 2"  (1.575 m)   Weight:   116.8 kg (257 lb 8 oz)   SpO2: 98%  94% 93%    Intake/Output Summary (Last 24 hours) at 06/23/11 1028 Last data filed at 06/23/11 0930  Gross per 24 hour  Intake 558.75 ml  Output      0 ml  Net 558.75 ml    Weight change:   Nursing note and vitals reviewed.  Constitutional: She is oriented to person, place, and time. She appears well-developed and well-nourished. No distress.  HENT:  Head: Normocephalic and atraumatic.  Eyes: EOM are normal.  Neck: Normal range of motion.  Cardiovascular: Normal rate, regular rhythm and normal heart sounds.  Pulmonary/Chest: Effort normal and breath sounds normal.  Abdominal: Soft. She exhibits no distension. There is no tenderness.  Musculoskeletal: Normal range of motion.  Neurological: She is alert and oriented to person, place, and time.  Skin: Skin is warm and dry.  Psychiatric: She has a normal mood and affect. Judgment normal   Lab Results: Results for orders placed during the hospital encounter of 06/22/11 (from the past 24 hour(s))  CBC     Status: Normal   Collection Time   06/22/11  1:15 PM      Component Value Range   WBC 6.5  4.0 - 10.5 (K/uL)   RBC 4.72  3.87 - 5.11 (MIL/uL)   Hemoglobin 13.0  12.0 - 15.0 (g/dL)   HCT 16.1  09.6 - 04.5 (%)   MCV 89.2  78.0 - 100.0 (fL)   MCH 27.5  26.0 - 34.0 (pg)   MCHC 30.9  30.0 - 36.0 (g/dL)   RDW 40.9  81.1 - 91.4 (%)   Platelets 349  150 - 400 (K/uL)  BASIC METABOLIC PANEL     Status: Abnormal   Collection Time   06/22/11  1:15 PM      Component Value Range   Sodium 141   135 - 145 (mEq/L)   Potassium 4.0  3.5 - 5.1 (mEq/L)   Chloride 106  96 - 112 (mEq/L)   CO2 26  19 - 32 (mEq/L)   Glucose, Bld 137 (*) 70 - 99 (mg/dL)   BUN 17  6 - 23 (mg/dL)   Creatinine, Ser 7.82  0.50 - 1.10 (mg/dL)   Calcium 8.3 (*) 8.4 - 10.5 (mg/dL)   GFR calc non Af Amer 65 (*) >90 (mL/min)   GFR calc Af Amer 75 (*) >90 (mL/min)  HEMOGLOBIN A1C     Status: Abnormal   Collection Time   06/22/11  1:15 PM      Component Value Range   Hemoglobin A1C 9.7 (*) <5.7 (%)   Mean Plasma Glucose 232 (*) <117 (mg/dL)  D-DIMER, QUANTITATIVE     Status: Abnormal   Collection Time   06/22/11  1:15 PM      Component Value Range   D-Dimer, Quant 2.49 (*) 0.00 - 0.48 (ug/mL-FEU)  GLUCOSE, CAPILLARY     Status: Abnormal   Collection Time   06/22/11 10:29 PM  Component Value Range   Glucose-Capillary 120 (*) 70 - 99 (mg/dL)   Comment 1 Notify RN    GLUCOSE, CAPILLARY     Status: Normal   Collection Time   06/23/11  7:37 AM      Component Value Range   Glucose-Capillary 93  70 - 99 (mg/dL)   Comment 1 Notify RN     Comment 2 Documented in Chart    COMPREHENSIVE METABOLIC PANEL     Status: Abnormal   Collection Time   06/23/11  9:00 AM      Component Value Range   Sodium 139  135 - 145 (mEq/L)   Potassium 4.3  3.5 - 5.1 (mEq/L)   Chloride 106  96 - 112 (mEq/L)   CO2 23  19 - 32 (mEq/L)   Glucose, Bld 111 (*) 70 - 99 (mg/dL)   BUN 13  6 - 23 (mg/dL)   Creatinine, Ser 1.61  0.50 - 1.10 (mg/dL)   Calcium 8.1 (*) 8.4 - 10.5 (mg/dL)   Total Protein 6.2  6.0 - 8.3 (g/dL)   Albumin 2.4 (*) 3.5 - 5.2 (g/dL)   AST 12  0 - 37 (U/L)   ALT 7  0 - 35 (U/L)   Alkaline Phosphatase 88  39 - 117 (U/L)   Total Bilirubin 0.3  0.3 - 1.2 (mg/dL)   GFR calc non Af Amer 85 (*) >90 (mL/min)   GFR calc Af Amer >90  >90 (mL/min)  CBC     Status: Normal   Collection Time   06/23/11  9:00 AM      Component Value Range   WBC 6.8  4.0 - 10.5 (K/uL)   RBC 4.47  3.87 - 5.11 (MIL/uL)   Hemoglobin 12.4   12.0 - 15.0 (g/dL)   HCT 09.6  04.5 - 40.9 (%)   MCV 88.8  78.0 - 100.0 (fL)   MCH 27.7  26.0 - 34.0 (pg)   MCHC 31.2  30.0 - 36.0 (g/dL)   RDW 81.1  91.4 - 78.2 (%)   Platelets 308  150 - 400 (K/uL)  BILIRUBIN, DIRECT     Status: Normal   Collection Time   06/23/11  9:00 AM      Component Value Range   Bilirubin, Direct <0.1  0.0 - 0.3 (mg/dL)  MAGNESIUM     Status: Normal   Collection Time   06/23/11  9:00 AM      Component Value Range   Magnesium 1.8  1.5 - 2.5 (mg/dL)     Micro: No results found for this or any previous visit (from the past 240 hour(s)).  Studies/Results: Dg Chest 2 View  06/22/2011  *RADIOLOGY REPORT*  Clinical Data: Short of breath.  CHEST - 2 VIEW  Comparison: 12/25/2008  Findings: Right lower lobe airspace disease is seen with lesser degree of infiltrate or atelectasis in the right upper lobe.  This is suspicious for pneumonia.  Left lung is grossly clear.  Heart size within normal limits allowing for low lung volumes and lordotic positioning peri  IMPRESSION: Right lower lobe consolidation and right upper lobe atelectasis versus infiltrate, suspicious for pneumonia.  Original Report Authenticated By: Danae Orleans, M.D.   Ct Angio Chest W/cm &/or Wo Cm  06/23/2011  *RADIOLOGY REPORT*  Clinical Data: Cough, diarrhea, weakness  CT ANGIOGRAPHY CHEST  Technique:  Multidetector CT imaging of the chest using the standard protocol during bolus administration of intravenous contrast. Multiplanar reconstructed images including MIPs were obtained  and reviewed to evaluate the vascular anatomy.  Contrast: OMNIPAQUE IOHEXOL 300 MG/ML  SOLN  Comparison: Chest radiographs dated 06/22/2010  Findings: No evidence of pulmonary embolism.  Suspected mild interstitial edema.  Moderate bilateral pleural effusions.  Patchy posterior right upper lobe and bilateral lower lobe opacities, possibly atelectasis.  Right upper lobe pneumonia is not entirely excluded.  Visualized thyroid is  unremarkable.  Cardiomegaly.  No pericardial effusion.  Coronary atherosclerosis. Mild atherosclerotic calcifications of the aortic arch.  Visualized upper abdomen unremarkable.  Degenerative changes of the visualized thoracolumbar spine.  IMPRESSION: No evidence of pulmonary embolism.  Cardiomegaly with suspected mild interstitial edema and moderate bilateral pleural effusions.  Patchy posterior right upper lobe and bilateral lower lobe opacities, possibly atelectasis.  Right upper lobe pneumonia is not entirely excluded.  Original Report Authenticated By: Charline Bills, M.D.    Medications:  Scheduled Meds:   . sodium chloride   Intravenous STAT  . amLODipine  2.5 mg Oral Daily  . aspirin EC  325 mg Oral Daily  . azithromycin  500 mg Intravenous Once  . cefTRIAXone (ROCEPHIN)  IV  1 g Intravenous Once  . cefTRIAXone (ROCEPHIN)  IV  1 g Intravenous Q24H  . docusate sodium  100 mg Oral BID  . enoxaparin  60 mg Subcutaneous Q24H  . glipiZIDE  10 mg Oral BID AC  . insulin aspart  0-15 Units Subcutaneous TID WC  . sodium chloride  1,000 mL Intravenous Once  . DISCONTD: cefTRIAXone (ROCEPHIN)  IV  1 g Intravenous Q24H  . DISCONTD: cefTRIAXone (ROCEPHIN)  IV  1 g Intravenous Q24H  . DISCONTD: ipratropium  0.5 mg Nebulization Q6H   Continuous Infusions:   . sodium chloride 75 mL/hr at 06/23/11 0620   PRN Meds:.acetaminophen, acetaminophen, albuterol, HYDROcodone-acetaminophen, iohexol, ipratropium, ondansetron (ZOFRAN) IV, ondansetron, DISCONTD: albuterol   Assessment: Principal Problem:  *PNA (pneumonia) Active Problems:  DM  BLINDNESS, BILATERAL  ESSENTIAL HYPERTENSION  CAP (community acquired pneumonia)   Plan:  #1 community-acquired pneumonia CT scan of the chest performed for pneumonia, continue Rocephin azithromycin #2 CHF? 2-D echo pending, check a BNP, DC IV fluids, low-dose Lasix, #3 diabetes poorly controlled with hemoglobin A1c of 9.7 continue glipizide and SSI #4  disposition anticipate discharge tomorrow or day after tomorrow   LOS: 1 day   [facility] 06/23/2011, 10:28 AM

## 2011-06-24 DIAGNOSIS — I509 Heart failure, unspecified: Secondary | ICD-10-CM

## 2011-06-24 LAB — URINE CULTURE

## 2011-06-24 LAB — GLUCOSE, CAPILLARY
Glucose-Capillary: 217 mg/dL — ABNORMAL HIGH (ref 70–99)
Glucose-Capillary: 153 mg/dL — ABNORMAL HIGH (ref 70–99)
Glucose-Capillary: 119 mg/dL — ABNORMAL HIGH (ref 70–99)

## 2011-06-24 MED ORDER — LEVOFLOXACIN 500 MG PO TABS
500.0000 mg | ORAL_TABLET | Freq: Every day | ORAL | Status: DC
  Administered 2011-06-24: 500 mg via ORAL
  Filled 2011-06-24 (×2): qty 1

## 2011-06-24 MED ORDER — FUROSEMIDE 40 MG PO TABS
40.0000 mg | ORAL_TABLET | Freq: Every day | ORAL | Status: DC
  Filled 2011-06-24: qty 1

## 2011-06-24 NOTE — Progress Notes (Signed)
Subjective: No complaints Hemodynamically stable Denies any chest pain or shortness of breath Telemetry shows PVCs  Objective: Vital signs in last 24 hours: Filed Vitals:   06/23/11 1817 06/23/11 2200 06/24/11 0600 06/24/11 1046  BP: 137/74 136/73 162/78 136/70  Pulse: 70 70 61   Temp: 97.9 F (36.6 C) 98 F (36.7 C) 98.5 F (36.9 C)   TempSrc: Oral Oral Oral   Resp: 18 18 18    Height:      Weight:      SpO2: 96% 93% 99%     Intake/Output Summary (Last 24 hours) at 06/24/11 1218 Last data filed at 06/24/11 0900  Gross per 24 hour  Intake    960 ml  Output      0 ml  Net    960 ml    Weight change:   Nursing note and vitals reviewed.  Constitutional: She is oriented to person, place, and time. She appears well-developed and well-nourished. No distress.  HENT:  Head: Normocephalic and atraumatic.  Eyes: EOM are normal.  Neck: Normal range of motion.  Cardiovascular: Normal rate, regular rhythm and normal heart sounds.  Pulmonary/Chest: Effort normal and breath sounds normal.  Abdominal: Soft. She exhibits no distension. There is no tenderness.  Musculoskeletal: Normal range of motion.  Neurological: She is alert and oriented to person, place, and time.  Skin: Skin is warm and dry.  Psychiatric: She has a normal mood and affect. Judgment normal   Lab Results: Results for orders placed during the hospital encounter of 06/22/11 (from the past 24 hour(s))  GLUCOSE, CAPILLARY     Status: Abnormal   Collection Time   06/23/11 12:23 PM      Component Value Range   Glucose-Capillary 130 (*) 70 - 99 (mg/dL)   Comment 1 Notify RN     Comment 2 Documented in Chart    GLUCOSE, CAPILLARY     Status: Abnormal   Collection Time   06/23/11  4:58 PM      Component Value Range   Glucose-Capillary 148 (*) 70 - 99 (mg/dL)  GLUCOSE, CAPILLARY     Status: Abnormal   Collection Time   06/23/11 10:19 PM      Component Value Range   Glucose-Capillary 217 (*) 70 - 99 (mg/dL)    GLUCOSE, CAPILLARY     Status: Abnormal   Collection Time   06/24/11  8:12 AM      Component Value Range   Glucose-Capillary 143 (*) 70 - 99 (mg/dL)     Micro: Recent Results (from the past 240 hour(s))  URINE CULTURE     Status: Normal   Collection Time   06/22/11  7:16 PM      Component Value Range Status Comment   Specimen Description URINE, CLEAN CATCH   Final    Special Requests NONE   Final    Culture  Setup Time [id number]   Final    Colony Count 90,000 COLONIES/ML   Final    Culture     Final    Value: Multiple bacterial morphotypes present, none predominant. Suggest appropriate recollection if clinically indicated.   Report Status 06/24/2011 FINAL   Final   CLOSTRIDIUM DIFFICILE BY PCR     Status: Normal   Collection Time   06/23/11 11:20 AM      Component Value Range Status Comment   C difficile by pcr NEGATIVE  NEGATIVE  Final     Studies/Results: Dg Chest 2 View  06/22/2011  *RADIOLOGY  REPORT*  Clinical Data: Short of breath.  CHEST - 2 VIEW  Comparison: 12/25/2008  Findings: Right lower lobe airspace disease is seen with lesser degree of infiltrate or atelectasis in the right upper lobe.  This is suspicious for pneumonia.  Left lung is grossly clear.  Heart size within normal limits allowing for low lung volumes and lordotic positioning peri  IMPRESSION: Right lower lobe consolidation and right upper lobe atelectasis versus infiltrate, suspicious for pneumonia.  Original Report Authenticated By: Danae Orleans, M.D.   Ct Angio Chest W/cm &/or Wo Cm  06/23/2011  *RADIOLOGY REPORT*  Clinical Data: Cough, diarrhea, weakness  CT ANGIOGRAPHY CHEST  Technique:  Multidetector CT imaging of the chest using the standard protocol during bolus administration of intravenous contrast. Multiplanar reconstructed images including MIPs were obtained and reviewed to evaluate the vascular anatomy.  Contrast: OMNIPAQUE IOHEXOL 300 MG/ML  SOLN  Comparison: Chest radiographs dated  06/22/2010  Findings: No evidence of pulmonary embolism.  Suspected mild interstitial edema.  Moderate bilateral pleural effusions.  Patchy posterior right upper lobe and bilateral lower lobe opacities, possibly atelectasis.  Right upper lobe pneumonia is not entirely excluded.  Visualized thyroid is unremarkable.  Cardiomegaly.  No pericardial effusion.  Coronary atherosclerosis. Mild atherosclerotic calcifications of the aortic arch.  Visualized upper abdomen unremarkable.  Degenerative changes of the visualized thoracolumbar spine.  IMPRESSION: No evidence of pulmonary embolism.  Cardiomegaly with suspected mild interstitial edema and moderate bilateral pleural effusions.  Patchy posterior right upper lobe and bilateral lower lobe opacities, possibly atelectasis.  Right upper lobe pneumonia is not entirely excluded.  Original Report Authenticated By: Charline Bills, M.D.    Medications:  Scheduled Meds:   . amLODipine  2.5 mg Oral Daily  . aspirin EC  325 mg Oral Daily  . cefTRIAXone (ROCEPHIN)  IV  1 g Intravenous Q24H  . docusate sodium  100 mg Oral BID  . enoxaparin  60 mg Subcutaneous Q24H  . furosemide  20 mg Oral Daily  . glipiZIDE  10 mg Oral BID AC  . insulin aspart  0-15 Units Subcutaneous TID WC   Continuous Infusions:  PRN Meds:.acetaminophen, acetaminophen, albuterol, HYDROcodone-acetaminophen, ipratropium, ondansetron (ZOFRAN) IV, ondansetron  Assessment: Principal Problem:  *PNA (pneumonia) Active Problems:  DM  BLINDNESS, BILATERAL  ESSENTIAL HYPERTENSION  CAP (community acquired pneumonia)   Plan:  #1 community-acquired pneumonia CT scan of the chest performed for pneumonia,dis continue Rocephin azithromycin and switch to Levaquin #2 CHF? 2-D echo pending, check a BNP, DC IV fluids, low-dose Lasix,  #3 diabetes poorly controlled with hemoglobin A1c of 9.7 continue glipizide and SSI  #4 disposition anticipate discharge tomorrow, PT OT eval for a safe discharge  plan  LOS: 2 days   [facility] 06/24/2011, 12:18 PM

## 2011-06-24 NOTE — Progress Notes (Signed)
  Echocardiogram 2D Echocardiogram has been performed.  Emelia Loron A 06/24/2011, 9:52 AM

## 2011-06-25 ENCOUNTER — Inpatient Hospital Stay (HOSPITAL_COMMUNITY): Payer: Medicare Other

## 2011-06-25 DIAGNOSIS — D696 Thrombocytopenia, unspecified: Secondary | ICD-10-CM

## 2011-06-25 DIAGNOSIS — H548 Legal blindness, as defined in USA: Secondary | ICD-10-CM

## 2011-06-25 DIAGNOSIS — E1165 Type 2 diabetes mellitus with hyperglycemia: Secondary | ICD-10-CM

## 2011-06-25 DIAGNOSIS — E118 Type 2 diabetes mellitus with unspecified complications: Secondary | ICD-10-CM

## 2011-06-25 LAB — BASIC METABOLIC PANEL
BUN: 19 mg/dL (ref 6–23)
GFR calc Af Amer: 45 mL/min — ABNORMAL LOW (ref >90)
GFR calc non Af Amer: 39 mL/min — ABNORMAL LOW (ref >90)
Potassium: 4.7 mEq/L (ref 3.5–5.1)
Sodium: 139 mEq/L (ref 135–145)

## 2011-06-25 LAB — GLUCOSE, CAPILLARY: Glucose-Capillary: 83 mg/dL (ref 70–99)

## 2011-06-25 MED ORDER — FUROSEMIDE 40 MG PO TABS
40.0000 mg | ORAL_TABLET | Freq: Every day | ORAL | Status: DC
  Administered 2011-06-26 – 2011-06-27 (×2): 40 mg via ORAL
  Filled 2011-06-25 (×2): qty 1

## 2011-06-25 MED ORDER — LEVOFLOXACIN IN D5W 500 MG/100ML IV SOLN
500.0000 mg | INTRAVENOUS | Status: DC
  Filled 2011-06-25 (×2): qty 100

## 2011-06-25 MED ORDER — LEVOFLOXACIN 500 MG PO TABS
500.0000 mg | ORAL_TABLET | Freq: Every day | ORAL | Status: DC
  Administered 2011-06-25 – 2011-06-29 (×5): 500 mg via ORAL
  Filled 2011-06-25 (×5): qty 1

## 2011-06-25 MED ORDER — FUROSEMIDE 10 MG/ML IJ SOLN
40.0000 mg | Freq: Every day | INTRAMUSCULAR | Status: DC
  Administered 2011-06-25: 40 mg via INTRAVENOUS
  Filled 2011-06-25: qty 4

## 2011-06-25 MED ORDER — CARVEDILOL 3.125 MG PO TABS
3.1250 mg | ORAL_TABLET | Freq: Every day | ORAL | Status: DC
  Administered 2011-06-25 – 2011-06-29 (×5): 3.125 mg via ORAL
  Filled 2011-06-25 (×7): qty 1

## 2011-06-25 MED ORDER — FUROSEMIDE 10 MG/ML IJ SOLN: 40.0000 mg | Freq: Two times a day (BID) | INTRAMUSCULAR | Status: DC

## 2011-06-25 MED ORDER — SACCHAROMYCES BOULARDII 250 MG PO CAPS
250.0000 mg | ORAL_CAPSULE | Freq: Two times a day (BID) | ORAL | Status: DC
  Administered 2011-06-25 – 2011-06-29 (×9): 250 mg via ORAL
  Filled 2011-06-25 (×10): qty 1

## 2011-06-25 NOTE — Evaluation (Signed)
Occupational Therapy Evaluation Patient Details Name: Jomayra Novitsky MRN: [id number] DOB: December 16, 1940 Today's Date: 06/25/2011 Time: 0865- 901 33 minutes    OT Assessment / Plan / Recommendation Clinical Impression  This 71 year old female was admitted with pna.  She is blind and has help from friends: pt's PLOF unknown as she was vague--may be variable. Pt has knee pain and is fearful of falling and these factor into decreased adls/mobility.  She is appropriate for skilled OT to improve ADL function with A X 2 goals, pt 50% for LB/ standing.    OT Assessment  Patient needs continued OT Services    Follow Up Recommendations  Skilled nursing facility    Barriers to Discharge Decreased caregiver support    Equipment Recommendations  Defer to next venue    Recommendations for Other Services    Frequency  Min 1X/week    Precautions / Restrictions Precautions Precautions: Fall Restrictions Weight Bearing Restrictions: No   Pertinent Vitals/Pain 2/10 knee at rest; 6 standing    ADL  Eating/Feeding: Simulated;Minimal assistance;Performed (to locate items/cut:  performed drink) Where Assessed - Eating/Feeding: Bed level Grooming: Performed;Wash/dry face;Set up Where Assessed - Grooming: Unsupported sitting Upper Body Bathing: Simulated;Minimal assistance Where Assessed - Upper Body Bathing: Unsupported sitting Lower Body Bathing: Simulated;+2 Total assistance Lower Body Bathing: Patient Percentage: 10% Where Assessed - Lower Body Bathing: Supported sit to stand Upper Body Dressing: Simulated;Moderate assistance (gown) Where Assessed - Upper Body Dressing: Unsupported sitting Lower Body Dressing: Simulated;+2 Total assistance Lower Body Dressing: Patient Percentage: 0% Toilet Transfer: Other (comment) (unable:  only able to stand) Toileting - Clothing Manipulation and Hygiene: Simulated;+2 Total assistance Toileting - Clothing Manipulation and Hygiene: Patient Percentage: 0% ADL  Comments: Pt vague about help at home, and how much she got OOB. Has 3:1 and uses depends    OT Diagnosis: Generalized weakness  OT Problem List: Decreased strength;Decreased activity tolerance;Impaired balance (sitting and/or standing);Impaired vision/perception;Decreased cognition;Decreased knowledge of use of DME or AE;Cardiopulmonary status limiting activity;Pain OT Treatment Interventions: Self-care/ADL training;Therapeutic exercise;Energy conservation;DME and/or AE instruction;Therapeutic activities;Patient/family education;Balance training   OT Goals Acute Rehab OT Goals OT Goal Formulation: With patient Time For Goal Achievement: 07/09/11 Potential to Achieve Goals: Fair ADL Goals Pt Will Perform Upper Body Bathing: with set-up;Sitting, edge of bed ADL Goal: Upper Body Bathing - Progress: Goal set today Pt Will Perform Lower Body Bathing: with 2+ total assist;Sit to stand from bed (with AE, pt 50%) ADL Goal: Lower Body Bathing - Progress: Goal set today Pt Will Transfer to Toilet: with 2+ total assist;Stand pivot transfer (pt 50%) ADL Goal: Toilet Transfer - Progress: Goal set today Arm Goals Pt Will Perform AROM: 1 set;10 reps;with minimal assist;Bilateral upper extremities (to increase strength and arom for adls) Arm Goal: AROM - Progress: Goal set today Miscellaneous OT Goals Miscellaneous OT Goal #1: pt will tolerate sitting eob x 10 minutes for adl activities to increase strength/activity tolerance OT Goal: Miscellaneous Goal #1 - Progress: Goal set today  Visit Information  Last OT Received On: 06/25/11 Assistance Needed: +2    Subjective Data      Prior Functioning  Home Living Lives With: Alone Available Help at Discharge: Friend(s) Type of Home: Independent living facility Home Access: Level entry Bathroom Shower/Tub: Tub/shower unit (sponge bathes) Bathroom Toilet: Standard (3:1) Home Adaptive Equipment: Bedside commode/3-in-1;Walker - rolling;Wheelchair -  manual;Straight cane Prior Function Level of Independence: Needs assistance Able to Take Stairs?: No Communication Communication: HOH Dominant Hand: Right    Cognition  Overall Cognitive Status: Impaired Area of Impairment:  (some difficulty with memory/PLOF--conflicting info) Behavior During Session:  (very fearful of falling with all mobility)    Extremity/Trunk Assessment Right Upper Extremity Assessment RUE ROM/Strength/Tone: Deficits (c/o pain bilaterally:  RUE new.  Distal WFLs.  AAROM to 90 B) Left Upper Extremity Assessment LUE ROM/Strength/Tone: Deficits   Mobility Bed Mobility Bed Mobility: Supine to Sit Supine to Sit: 1: +2 Total assist Supine to Sit: Patient Percentage: 40% Transfers Transfers: Sit to Stand Sit to Stand: 1: +2 Total assist Sit to Stand: Patient Percentage: 30% Details for Transfer Assistance: pt very fearful of falling   Exercise    Balance    End of Session OT - End of Session Equipment Utilized During Treatment: Gait belt Activity Tolerance: Patient limited by pain;Patient limited by fatigue Patient left: in bed;with call bell/phone within reach Nurse Communication: Mobility status   Reshard Guillet 06/25/2011, 9:40 AM Marica Otter, OTR/L [phone] 06/25/2011

## 2011-06-25 NOTE — Progress Notes (Signed)
Utilization review completed.  

## 2011-06-25 NOTE — Progress Notes (Signed)
Clinical Social Work Department BRIEF PSYCHOSOCIAL ASSESSMENT 06/25/2011  Patient:  [name], [name]     Account Number:  [id number]     Admit date:  06/22/2011  Clinical Social Worker:  Eddie Candle  Date/Time:  06/25/2011 12:00 N  Referred by:  Physician  Date Referred:  06/25/2011 Referred for  Other - See comment   Other Referral:   Safety   Interview type:  Patient Other interview type:   fr    PSYCHOSOCIAL DATA Living Status:  ALONE Admitted from facility:   Level of care:   Primary support name:  friend Primary support relationship to patient:  FRIEND Degree of support available:   Patient states that she is currently alone but has friends and local supports when she needs them.    CURRENT CONCERNS Current Concerns  Post-Acute Placement   Other Concerns:   CSW reviewed OT assessment after meeting with patient. Patient will like reject SNF placement without outside support.    SOCIAL WORK ASSESSMENT / PLAN Patient will need follow up support in order to work on appropriate discharge plan.  Patient states that she has HH services but doesn't know which company provides them. Patient will not share information about support but Yancey Flemings is listed has her daughter on her facesheet.   Assessment/plan status:  Psychosocial Support/Ongoing Assessment of Needs Other assessment/ plan:   Awaiting PT evaluation as well. Patient has some clear mobility challenges that will hinder her from going home, but she is having a difficult time understanding that.   Information/referral to community resources:    PATIENT'S/FAMILY'S RESPONSE TO PLAN OF CARE: Patient states that she does not desire any further support, but CSW shared with patient that a Child psychotherapist will follow up with her.

## 2011-06-25 NOTE — Plan of Care (Signed)
Problem: Phase I Progression Outcomes Goal: OOB as tolerated unless otherwise ordered Outcome: Not Progressing Pt able to stand with A x 2.  Could not step/transfer.  Fearful of falling and pt has knee pain.

## 2011-06-25 NOTE — Progress Notes (Signed)
Patient refusing x-rays of her knees at bedside at this time.  As per radiology tech, will return at 1500pm to try again.  Will continue to monitor.

## 2011-06-25 NOTE — Progress Notes (Signed)
Upon physical assessment, RN noted multiple wounds to bilateral arms and bilateral buttocks covered with tegasorb.  Patient feeling nauseous this am and medicated with zofran.  Breath sounds this am coarse with dyspnea on exertion.  Physical therapy at bedside this am, as per physical therapy only able to stand patient at bedside.  Patient having pain in legs and knees.  Dr. Susie Cassette aware of above.  Will order wound consult for patient.  Will continue to monitor.

## 2011-06-25 NOTE — Progress Notes (Signed)
Subjective: Reportedly has multiple areas of skin breakdown Nauseous this morning Mild shortness of breath With exertion  Objective: Vital signs in last 24 hours: Filed Vitals:   06/24/11 1046 06/24/11 1345 06/25/11 0530 06/25/11 0842  BP: 136/70 144/71 152/81 127/56  Pulse:  71 64 74  Temp:  97.8 F (36.6 C) 97.4 F (36.3 C) 97.6 F (36.4 C)  TempSrc:  Oral Oral Oral  Resp:  18 12   Height:      Weight:      SpO2:  98% 96% 97%    Intake/Output Summary (Last 24 hours) at 06/25/11 0850 Last data filed at 06/25/11 0155  Gross per 24 hour  Intake   1460 ml  Output    100 ml  Net   1360 ml    Weight change:   Nursing note and vitals reviewed.  Constitutional: She is oriented to person, place, and time. She appears well-developed and well-nourished. No distress.  HENT:  Head: Normocephalic and atraumatic.  Eyes: EOM are normal.  Neck: Normal range of motion.  Cardiovascular: Normal rate, regular rhythm and normal heart sounds.  Pulmonary/Chest: Effort normal and breath sounds normal.  Abdominal: Soft. She exhibits no distension. There is no tenderness.  Musculoskeletal: Normal range of motion.  Neurological: She is alert and oriented to person, place, and time.  Skin: Skin is warm and dry.  Psychiatric: She has a normal mood and affect. Judgment normal   Lab Results: Results for orders placed during the hospital encounter of 06/22/11 (from the past 24 hour(s))  GLUCOSE, CAPILLARY     Status: Abnormal   Collection Time   06/24/11 12:35 PM      Component Value Range   Glucose-Capillary 153 (*) 70 - 99 (mg/dL)  GLUCOSE, CAPILLARY     Status: Abnormal   Collection Time   06/24/11  5:07 PM      Component Value Range   Glucose-Capillary 119 (*) 70 - 99 (mg/dL)  GLUCOSE, CAPILLARY     Status: Abnormal   Collection Time   06/24/11  9:58 PM      Component Value Range   Glucose-Capillary 107 (*) 70 - 99 (mg/dL)  BASIC METABOLIC PANEL     Status: Abnormal   Collection  Time   06/25/11  6:57 AM      Component Value Range   Sodium 139  135 - 145 (mEq/L)   Potassium 4.7  3.5 - 5.1 (mEq/L)   Chloride 106  96 - 112 (mEq/L)   CO2 24  19 - 32 (mEq/L)   Glucose, Bld 100 (*) 70 - 99 (mg/dL)   BUN 19  6 - 23 (mg/dL)   Creatinine, Ser 1.61 (*) 0.50 - 1.10 (mg/dL)   Calcium 8.4  8.4 - 09.6 (mg/dL)   GFR calc non Af Amer 39 (*) >90 (mL/min)   GFR calc Af Amer 45 (*) >90 (mL/min)  GLUCOSE, CAPILLARY     Status: Normal   Collection Time   06/25/11  7:45 AM      Component Value Range   Glucose-Capillary 90  70 - 99 (mg/dL)   Comment 1 Notify RN       Micro: Recent Results (from the past 240 hour(s))  URINE CULTURE     Status: Normal   Collection Time   06/22/11  7:16 PM      Component Value Range Status Comment   Specimen Description URINE, CLEAN CATCH   Final    Special Requests NONE  Final    Culture  Setup Time [id number]   Final    Colony Count 90,000 COLONIES/ML   Final    Culture     Final    Value: Multiple bacterial morphotypes present, none predominant. Suggest appropriate recollection if clinically indicated.   Report Status 06/24/2011 FINAL   Final   CLOSTRIDIUM DIFFICILE BY PCR     Status: Normal   Collection Time   06/23/11 11:20 AM      Component Value Range Status Comment   C difficile by pcr NEGATIVE  NEGATIVE  Final     Studies/Results: No results found.  Medications: Scheduled Meds:   . aspirin EC  325 mg Oral Daily  . carvedilol  3.125 mg Oral 1 day or 1 dose  . docusate sodium  100 mg Oral BID  . enoxaparin  60 mg Subcutaneous Q24H  . furosemide  40 mg Intravenous Daily  . glipiZIDE  10 mg Oral BID AC  . insulin aspart  0-15 Units Subcutaneous TID WC  . levofloxacin  500 mg Oral Daily  . DISCONTD: amLODipine  2.5 mg Oral Daily  . DISCONTD: cefTRIAXone (ROCEPHIN)  IV  1 g Intravenous Q24H  . DISCONTD: furosemide  40 mg Intravenous BID  . DISCONTD: furosemide  20 mg Oral Daily  . 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 He will start low-dose Coreg #3 diabetes hemoglobin A1c of 9.7 poorly controlled continue glipizide and sliding scale insulin #4 multiple skin wounds./ Pressure ulcers Wound care consultation has been obtained these were present at the time of admission #5 disposition Will likely need placement given inability to take care of herself at home, social work consultation has been placed     LOS: 3 days   [facility] 06/25/2011, 8:50 AM

## 2011-06-25 NOTE — Progress Notes (Signed)
IV team in to access for initiation of IV, states she has no access sites visible and if possibly for discharge soon not a PICC candidate.  Notified Dr. Susie Cassette, orders received

## 2011-06-25 NOTE — Evaluation (Signed)
Physical Therapy Evaluation Patient Details Name: [name] MRN: [id number] DOB: [birth year] Today's Date: 06/25/2011 Time: 4098-1191 PT Time Calculation (min): 18 min  PT Assessment / Plan / Recommendation Clinical Impression  71 yr old F who gets most of her care at [facility] comes in with   weakness and  3 days of diarrhea not associated with  nausea or vomiting. She has had a cough but denies shortness of breath. She denies fevers or chills. She reports she's become weak at home to the point where she's had difficulty getting out of bed and performing her activities of daily living. She's been wearing depends since his had difficulty getting to the bathroom. She's concerned about her ability to care for self at home; pt will benefit form PT to maximize independence; eval limited due to pt pain today.    PT Assessment  Patient needs continued PT services    Follow Up Recommendations  Skilled nursing facility    Barriers to Discharge   ??limited assist at  home    lEquipment Recommendations  Defer to next venue    Recommendations for Other Services     Frequency Min 3X/week    Precautions / Restrictions Precautions Precaution Comments: pt c/o 10/10 pain left LE;  Restrictions Weight Bearing Restrictions: No   Pertinent Vitals/Pain       Mobility  Bed Mobility Details for Bed Mobility Assistance: unable to assess due to pain    Exercises     PT Diagnosis: Difficulty walking  PT Problem List: Decreased strength;Decreased range of motion;Decreased activity tolerance;Decreased mobility;Decreased knowledge of use of DME;Pain PT Treatment Interventions: Gait training;Functional mobility training;Therapeutic activities;Therapeutic exercise;Patient/family education;DME instruction   PT Goals Acute Rehab PT Goals PT Goal Formulation: With patient Time For Goal Achievement: 07/09/11 Potential to Achieve Goals: Fair Pt will go Supine/Side to Sit: with +2 total assist  (pt=50%) PT Goal: Supine/Side to Sit - Progress: Goal set today Pt will go Sit to Stand: with +2 total assist (pt=50%) PT Goal: Sit to Stand - Progress: Goal set today Pt will go Stand to Sit: with +2 total assist (pt=50%) PT Goal: Stand to Sit - Progress: Goal set today Pt will Transfer Bed to Chair/Chair to Bed: with +2 total assist (pt=50%) PT Transfer Goal: Bed to Chair/Chair to Bed - Progress: Goal set today  Visit Information  Last PT Received On: 06/25/11 Assistance Needed: +2    Subjective Data  Subjective: I ain't doing nothing, these doctors need to listen to me. Patient Stated Goal: pain better   Prior Functioning  Communication Communication: Other (comment) (pt is blind)    Cognition  Overall Cognitive Status: Appears within functional limits for tasks assessed/performed Arousal/Alertness: Awake/alert Orientation Level: Appears intact for tasks assessed Behavior During Session: [facility] for tasks performed Cognition - Other Comments: pt oriented but refuses to perform EOB, OOB due to knee pain,  states MD needs to xray her knees    Extremity/Trunk Assessment Right Upper Extremity Assessment RUE ROM/Strength/Tone:  (c/o right shoulder pain) Right Lower Extremity Assessment RLE ROM/Strength/Tone: Due to pain;Unable to fully assess (3/5 grossly, AAROM WFL) Left Lower Extremity Assessment LLE ROM/Strength/Tone: Unable to fully assess;Due to pain   Balance    End of Session PT - End of Session Equipment Utilized During Treatment: Gait belt Activity Tolerance: Patient tolerated treatment well Patient left: with call bell/phone within reach;in bed   [facility] 06/25/2011, 12:59 PM

## 2011-06-26 ENCOUNTER — Inpatient Hospital Stay (HOSPITAL_COMMUNITY): Payer: Medicare Other

## 2011-06-26 LAB — BASIC METABOLIC PANEL
BUN: 21 mg/dL (ref 6–23)
Chloride: 107 mEq/L (ref 96–112)
Creatinine, Ser: 1.46 mg/dL — ABNORMAL HIGH (ref 0.50–1.10)
GFR calc Af Amer: 41 mL/min — ABNORMAL LOW (ref >90)
Glucose, Bld: 80 mg/dL (ref 70–99)

## 2011-06-26 LAB — CARDIAC PANEL(CRET KIN+CKTOT+MB+TROPI)
CK, MB: 2.4 ng/mL (ref 0.3–4.0)
Total CK: 36 U/L (ref 7–177)
Troponin I: <0.3 ng/mL (ref <0.30)

## 2011-06-26 LAB — GLUCOSE, CAPILLARY
Glucose-Capillary: 63 mg/dL — ABNORMAL LOW (ref 70–99)
Glucose-Capillary: 85 mg/dL (ref 70–99)

## 2011-06-26 MED ORDER — METOLAZONE 2.5 MG PO TABS
2.5000 mg | ORAL_TABLET | Freq: Once | ORAL | Status: AC
  Administered 2011-06-26: 2.5 mg via ORAL
  Filled 2011-06-26: qty 1

## 2011-06-26 MED ORDER — BISACODYL 10 MG RE SUPP: 10.0000 mg | Freq: Every day | RECTAL | Status: DC | PRN

## 2011-06-26 MED ORDER — METOCLOPRAMIDE HCL 5 MG PO TABS
5.0000 mg | ORAL_TABLET | Freq: Three times a day (TID) | ORAL | Status: DC
  Administered 2011-06-26 – 2011-06-29 (×9): 5 mg via ORAL
  Filled 2011-06-26 (×13): qty 1

## 2011-06-26 MED ORDER — SODIUM CHLORIDE 0.9 % IJ SOLN
10.0000 mL | INTRAMUSCULAR | Status: DC | PRN
  Administered 2011-06-26 (×2): 10 mL
  Administered 2011-06-27 (×2): 20 mL
  Administered 2011-06-29: 10 mL

## 2011-06-26 NOTE — Progress Notes (Signed)
EKG done, results called to MD. New orders placed for cardiac panel(labs). IV team paged for venous access. Charge Nurse told.  MCCLAIN, Regina Ganci L 06/26/2011 MCCLAIN, Lilana Blasko L

## 2011-06-26 NOTE — Consult Note (Signed)
WOC consult Note Reason for Consult: numerous small areas of partial thickness tissue loss Wound type:Not pressure; infectious vs moisture associated skin damage Pressure Ulcer POA: No Measurement: 4-6 open areas on lateral hips and on upper buttocks, largest measures 1.5cm round x .2cm Wound ZOX:WRUEA dry, pink.  Some are covered with dried serum (scab) Drainage (amount, consistency, odor) none Periwound:intact Dressing procedure/placement/frequency:I will recommend a low air loss overlay and heel protection boots in addition to timely incontinence care using our house emollient products and cleanser.  We will use only 1 chux beneath her and hopefull can rehydrate and heal these small areas without a topic dressing. I will not follow.  Please re-consult if needed. Thanks, Ladona Mow, MSN, RN, [facility], CWOCN [phone])

## 2011-06-26 NOTE — Progress Notes (Signed)
CSW met with patient. Patient is alert and oriented x3. Discussed need for SNF. Patient expressed concerns that she could not afford SNF. CSW explained that patient has blue medicare and CSW will try and obtain auth prior to discharge. Patient is agreeable to being faxed out. FL2 completed and faxed to receive bed offers.  Talar Fraley C. Naveen Clardy MSW, LCSW [phone]

## 2011-06-26 NOTE — Clinical Social Work Placement (Unsigned)
     Clinical Social Work Department CLINICAL SOCIAL WORK PLACEMENT NOTE 06/26/2011  Patient:  [name], [name]  Account Number:  [id number] Admit date:  06/22/2011  Clinical Social Worker:  Becky Sax, LCSW  Date/time:  06/26/2011 12:00 M  Clinical Social Work is seeking post-discharge placement for this patient at the following level of care:   SKILLED NURSING   (*CSW will update this form in Epic as items are completed)   06/26/2011  Patient/family provided with [facility] Department of Clinical Social Works list of facilities offering this level of care within the geographic area requested by the patient (or if unable, by the patients family).  06/26/2011  Patient/family informed of their freedom to choose among providers that offer the needed level of care, that participate in Medicare, Medicaid or managed care program needed by the patient, have an available bed and are willing to accept the patient.  06/26/2011  Patient/family informed of [facility] ownership interest in [facility], as well as of the fact that they are under no obligation to receive care at this facility.  PASARR submitted to [employer] on 06/26/2011 PASARR number received from [employer] on   FL2 transmitted to all facilities in geographic area requested by pt/family on  06/26/2011 FL2 transmitted to all facilities within larger geographic area on   Patient informed that his/her managed care company has contracts with or will negotiate with  certain facilities, including the following:     Patient/family informed of bed offers received:   Patient chooses bed at  Physician recommends and patient chooses bed at    Patient to be transferred to  on   Patient to be transferred to facility by   The following physician request were entered in Epic:   Additional Comments:

## 2011-06-26 NOTE — Progress Notes (Signed)
Patient received at 1500pm.  Patient awake oriented x3.  Post picc line placement in right upper arm.  Vital signs stable, patient afebrile.  Blood sugar at dinner time was 67.  Patient given juice and ate dinner.  Rechecked accucheck at 1949pm.  Blood sugar 63.  Patient asymptomatic stating, "I feel fine."  Patient given orange juice and apple juice.  Night RN notified of low blood sugar.  Will continue to monitor.

## 2011-06-26 NOTE — Clinical Social Work Placement (Signed)
A [name] Layer [phone] has been helping take care of this pt for aprox 3 years. States family comes to check on pt 1 time a month and is not active in her care. This friend [name] comes by in the beginning of the month and pays pt bills as pt is unable to do so herself. [name] is afraid she will not be available in the future as she is a full time Educational psychologist. [name] states pt house is not safe. Pt eats outdated food in fridge at times it  and has caused GI issues. Pt has been unable to get out of bed for aprox 2 months due to leg and knee pain and has been urinating on towels and throwing them in pile on the floor. Pt has been found with feces and urine on skin from days. Pt falls constantly and has been found on the floor after multiple hours of laying on ground. Pt has one other care giver a female friend but he only does what he can. Pt misses pills or double up on pills or drops them in her bed due to her inability to see. [name] is very concerned for the pt well being and safety. She would like a Child psychotherapist to please contact her concerning safety in pt home. She is in school during the day but would like SW to leave a message if needed so she may call them back.

## 2011-06-26 NOTE — Progress Notes (Signed)
CSW received a telephone call from Blaine Hamper at [employer] [phone]). They have been heavily involved with patient. They have a guardianship hearing on June 11th. They were unable to get a court hearing prior to then. They have a capacity evaluation that has found patient not capable to make her medical decisions. CSW asked if there was the possibility of getting court date expedited. Melissa states that patient is unable to care for herself, has frequently been found in her feces and urine. Patient is unable to ambulate. Patient has refused care. She has previously refused to go to the hospital when ill.  CSW conferenced case with Chiropodist, Wandra Mannan, as well as Chief Executive Officer, [employer]. Patient will need an in hospital capacity evaluation to help determine outcome. Patient will need medicaid initiated. Possible letter of guarantee if [facility] denies.  Arna Luis C. Kasten Leveque MSW, LCSW [phone]

## 2011-06-26 NOTE — Procedures (Signed)
Placement of right arm PICC for IV antibiotics.  Tip in SVC.  Ready to use.

## 2011-06-26 NOTE — Progress Notes (Signed)
Patient seen for midline catheter placement.  Patient scanned with ultrasound both left and right upper arms.  No venous sites found large enough to place line.  All measurements show that the catheter would occupy greater than 50% of vein volume.  Annabelle Harman, RN to notify MD. Loraine Maple, Lajean Manes

## 2011-06-26 NOTE — Progress Notes (Addendum)
Subjective: Patient requesting to go home Still appears to be somewhat short of breath although she denies that   Objective: Vital signs in last 24 hours: Filed Vitals:   06/25/11 0842 06/25/11 1531 06/25/11 2159 06/26/11 0551  BP: 127/56 169/60 144/71 141/66  Pulse: 74 89 74 71  Temp: 97.6 F (36.4 C) 97.9 F (36.6 C) 98.3 F (36.8 C) 98.4 F (36.9 C)  TempSrc: Oral Oral Oral Oral  Resp:  18 18 17   Height:      Weight:      SpO2: 97% 100% 100% 97%    Intake/Output Summary (Last 24 hours) at 06/26/11 0939 Last data filed at 06/25/11 1507  Gross per 24 hour  Intake      0 ml  Output    450 ml  Net   -450 ml    Weight change:   Nursing note and vitals reviewed.  Constitutional: She is oriented to person, place, and time. She appears well-developed and well-nourished. No distress.  HENT:  Head: Normocephalic and atraumatic.  Eyes: EOM are normal.  Neck: Normal range of motion.  Cardiovascular: Normal rate, regular rhythm and normal heart sounds.  Pulmonary/Chest: Effort normal and breath sounds normal.  Abdominal: Soft. She exhibits no distension. There is no tenderness.  Musculoskeletal: Normal range of motion.  Neurological: She is alert and oriented to person, place, and time.  Skin: Skin is warm and dry.  Psychiatric: She has a normal mood and affect. Judgment normal   Lab Results: Results for orders placed during the hospital encounter of 06/22/11 (from the past 24 hour(s))  GLUCOSE, CAPILLARY     Status: Normal   Collection Time   06/25/11 11:53 AM      Component Value Range   Glucose-Capillary 83  70 - 99 (mg/dL)   Comment 1 Notify RN    GLUCOSE, CAPILLARY     Status: Normal   Collection Time   06/25/11  5:35 PM      Component Value Range   Glucose-Capillary 92  70 - 99 (mg/dL)   Comment 1 Notify RN    GLUCOSE, CAPILLARY     Status: Normal   Collection Time   06/25/11  9:57 PM      Component Value Range   Glucose-Capillary 83  70 - 99 (mg/dL)   Comment 1 Notify RN    BASIC METABOLIC PANEL     Status: Abnormal   Collection Time   06/26/11  4:55 AM      Component Value Range   Sodium 139  135 - 145 (mEq/L)   Potassium 4.6  3.5 - 5.1 (mEq/L)   Chloride 107  96 - 112 (mEq/L)   CO2 22  19 - 32 (mEq/L)   Glucose, Bld 80  70 - 99 (mg/dL)   BUN 21  6 - 23 (mg/dL)   Creatinine, Ser 4.09 (*) 0.50 - 1.10 (mg/dL)   Calcium 8.3 (*) 8.4 - 10.5 (mg/dL)   GFR calc non Af Amer 36 (*) >90 (mL/min)   GFR calc Af Amer 41 (*) >90 (mL/min)     Micro: Recent Results (from the past 240 hour(s))  URINE CULTURE     Status: Normal   Collection Time   06/22/11  7:16 PM      Component Value Range Status Comment   Specimen Description URINE, CLEAN CATCH   Final    Special Requests NONE   Final    Culture  Setup Time [id number]   Final  Colony Count 90,000 COLONIES/ML   Final    Culture     Final    Value: Multiple bacterial morphotypes present, none predominant. Suggest appropriate recollection if clinically indicated.   Report Status 06/24/2011 FINAL   Final   CLOSTRIDIUM DIFFICILE BY PCR     Status: Normal   Collection Time   06/23/11 11:20 AM      Component Value Range Status Comment   C difficile by pcr NEGATIVE  NEGATIVE  Final     Studies/Results: Dg Knee 1-2 Views Left  06/25/2011  *RADIOLOGY REPORT*  Clinical Data: Left knee pain, arthritis  LEFT KNEE - 1-2 VIEW  Comparison: 06/13/2011  Findings: Suboptimal positioning of the frontal radiograph.  No fracture or dislocation is seen.  Severe tricompartmental degenerative changes.  Small suprapatellar knee joint effusion.  Vascular calcifications.  IMPRESSION: Severe tricompartmental degenerative changes with small suprapatellar knee joint effusion.  Original Report Authenticated By: Charline Bills, M.D.   Dg Knee 1-2 Views Right  06/25/2011  *RADIOLOGY REPORT*  Clinical Data: Right knee arthritis, pain  RIGHT KNEE - 1-2 VIEW  Comparison: 05/09/2011  Findings: Severe tricompartmental  degenerative changes.  No fracture or dislocation is seen.  Small suprapatellar knee joint effusion.  Vascular calcifications.  IMPRESSION: Severe tricompartmental degenerative changes with small suprapatellar knee joint effusion.  Original Report Authenticated By: Charline Bills, M.D.    Medications:  Scheduled Meds:   . aspirin EC  325 mg Oral Daily  . carvedilol  3.125 mg Oral Q breakfast  . docusate sodium  100 mg Oral BID  . enoxaparin  60 mg Subcutaneous Q24H  . furosemide  40 mg Oral Daily  . glipiZIDE  10 mg Oral BID AC  . insulin aspart  0-15 Units Subcutaneous TID WC  . levofloxacin  500 mg Oral Daily  . saccharomyces boulardii  250 mg Oral BID  . DISCONTD: furosemide  40 mg Intravenous Daily  . DISCONTD: levofloxacin (LEVAQUIN) IV  500 mg Intravenous Q24H   Continuous Infusions:  PRN Meds:.acetaminophen, acetaminophen, albuterol, HYDROcodone-acetaminophen, ipratropium, ondansetron (ZOFRAN) IV, ondansetron   Assessment: Principal Problem:  *PNA (pneumonia) Active Problems:  DM  BLINDNESS, BILATERAL  ESSENTIAL HYPERTENSION  CAP (community acquired pneumonia)   Plan: #1 community-acquired pneumonia patient changed to Levaquin from azithromycin and Rocephin , she lost her IV access yesterday. 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 Add Zaroxolyn today as the patient has not had any significant improvement in her urine output, and she still appears to be volume overloaded. no ACE inhibitor at this time because of creatinine increasing from 0.7-1.36. We will start low-dose Coreg   #3 diabetes hemoglobin A1c of 9.7 poorly controlled continue glipizide and sliding scale insulin   #4 multiple skin wounds./ Pressure ulcers Wound care consultation has been obtained these were present at the time of admission    Acute kidney injury creatinine slowly trending up, given the patient's low EF and diabetes as well as hypertension she probably has a baseline of 1-1.2, monitor closely if continues to increase then consider nephrology consultation  #5 disposition Will likely need placement given inability to take care of herself at home, social work consultation has been placed . Will need Adult Protective Services to evaluate home safety, living situation  #6. Abdominal pain nausea  Previous CT scan this year  showed  Dilated, low-density, mildly heterogeneous endometrial and  cervical canals, suggesting the possibility of an obstructing  cervical or endometrial malignancy.  2. Fundal uterine fibroids with submucosal components.  3. Multiple small, bilateral nonobstructing renal calculi.  She has not had any follow as she says she is blind, can't dial numbers  Repeat CT today   Kindly refer to the note below     "Joeseph Amor Layer [phone] has been helping take care of this pt for aprox 3 years. States family comes to check on pt 1 time a month and is not active in her care. This friend whitney comes by in the beginning of the month and pays pt bills as pt is unable to do so herself. Alphonzo Lemmings is afraid she will not be available in the future as she is a full time Educational psychologist. Whitney states pt house is not safe. Pt eats outdated food in fridge at times it and has caused GI issues. Pt has been unable to get out of bed for aprox 2 months due to leg and knee pain and has been urinating on towels and throwing them in pile on the floor. Pt has been found with feces and urine on skin from days. Pt falls constantly and has been found on the floor after multiple hours of laying on ground. Pt has one other care giver a female friend but he only does what he can. Pt misses pills or double up on pills or drops them in her bed due to her inability to see. Alphonzo Lemmings is very concerned for the pt well being and  safety. She would like a Child psychotherapist to please contact her concerning safety in pt home. She is in school during the day but would like SW to leave a message if needed so she may call them back. "   Psychiatric consultation requested    LOS: 4 days   [facility] 06/26/2011, 9:39 AM

## 2011-06-27 DIAGNOSIS — F329 Major depressive disorder, single episode, unspecified: Secondary | ICD-10-CM

## 2011-06-27 DIAGNOSIS — D696 Thrombocytopenia, unspecified: Secondary | ICD-10-CM

## 2011-06-27 DIAGNOSIS — E1165 Type 2 diabetes mellitus with hyperglycemia: Secondary | ICD-10-CM

## 2011-06-27 DIAGNOSIS — H548 Legal blindness, as defined in USA: Secondary | ICD-10-CM

## 2011-06-27 DIAGNOSIS — E118 Type 2 diabetes mellitus with unspecified complications: Secondary | ICD-10-CM

## 2011-06-27 DIAGNOSIS — F3289 Other specified depressive episodes: Secondary | ICD-10-CM

## 2011-06-27 DIAGNOSIS — M170 Bilateral primary osteoarthritis of knee: Secondary | ICD-10-CM | POA: Diagnosis present

## 2011-06-27 LAB — CARDIAC PANEL(CRET KIN+CKTOT+MB+TROPI)
CK, MB: 2.2 ng/mL (ref 0.3–4.0)
CK, MB: 2.3 ng/mL (ref 0.3–4.0)
CK, MB: 2.4 ng/mL (ref 0.3–4.0)
CK, MB: 2.5 ng/mL (ref 0.3–4.0)
Relative Index: INVALID (ref 0.0–2.5)
Total CK: 28 U/L (ref 7–177)
Total CK: 32 U/L (ref 7–177)
Troponin I: <0.3 ng/mL (ref <0.30)
Troponin I: <0.3 ng/mL (ref <0.30)
Troponin I: <0.3 ng/mL (ref <0.30)
Troponin I: <0.3 ng/mL (ref <0.30)

## 2011-06-27 LAB — BASIC METABOLIC PANEL
CO2: 25 mEq/L (ref 19–32)
Chloride: 101 mEq/L (ref 96–112)
Creatinine, Ser: 1.57 mg/dL — ABNORMAL HIGH (ref 0.50–1.10)
Glucose, Bld: 58 mg/dL — ABNORMAL LOW (ref 70–99)

## 2011-06-27 LAB — GLUCOSE, CAPILLARY
Glucose-Capillary: 61 mg/dL — ABNORMAL LOW (ref 70–99)
Glucose-Capillary: 92 mg/dL (ref 70–99)

## 2011-06-27 MED ORDER — DEXTROSE-NACL 5-0.9 % IV SOLN
INTRAVENOUS | Status: AC
  Administered 2011-06-27: 30 mL/h via INTRAVENOUS

## 2011-06-27 MED ORDER — INSULIN ASPART 100 UNIT/ML ~~LOC~~ SOLN
0.0000 [IU] | Freq: Three times a day (TID) | SUBCUTANEOUS | Status: DC
  Administered 2011-06-29: 1 [IU] via SUBCUTANEOUS

## 2011-06-27 MED ORDER — GLIPIZIDE ER 5 MG PO TB24
5.0000 mg | ORAL_TABLET | Freq: Two times a day (BID) | ORAL | Status: DC
  Filled 2011-06-27: qty 1

## 2011-06-27 NOTE — Progress Notes (Signed)
Phoned [employer], CSW to come meet with Renelda Mom from [employer] desired to see, speak and give her some information but she states at lunch.  Copy of paperwork made and placed on the patient's chart.  Bethany's phone number given to Mr. Valentina Lucks for later contact

## 2011-06-27 NOTE — Progress Notes (Signed)
Physical Therapy Treatment Patient Details Name: [name] MRN: [id number] DOB: 30-Nov-1940 Today's Date: 06/27/2011 Time: 2725-3664 PT Time Calculation (min): 31 min  PT Assessment / Plan / Recommendation Comments on Treatment Session  Pt willilng to sit EOB this session. Pt continues to demonstrate weakness, decreased activity tolerance, and impaired balance. Fatigues easily. Continue to recommend SNF    Follow Up Recommendations  Skilled nursing facility    Barriers to Discharge        Equipment Recommendations  Defer to next venue    Recommendations for Other Services    Frequency Min 3X/week   Plan Discharge plan remains appropriate    Precautions / Restrictions Precautions Precautions: Fall Restrictions Weight Bearing Restrictions: No   Pertinent Vitals/Pain     Mobility  Bed Mobility Bed Mobility: Rolling Right;Rolling Left;Supine to Sit;Sit to Supine Rolling Right: 2: Max assist;With rail Rolling Left: 2: Max assist;With rail Supine to Sit: 1: +2 Total assist Supine to Sit: Patient Percentage: 20% Sit to Supine: 1: +2 Total assist Sit to Supine: Patient Percentage: 10% Details for Bed Mobility Assistance: VCs technique, hand placement. Assist for bil LE off/onto bed and trunk to upright/supine. Increased time and effortful for pt.  Transfers Transfers: Not assessed    Exercises     PT Diagnosis:    PT Problem List:   PT Treatment Interventions:     PT Goals Acute Rehab PT Goals PT Goal: Supine/Side to Sit - Progress: Progressing toward goal  Visit Information  Last PT Received On: 06/27/11 Assistance Needed: +2    Subjective Data  Subjective: "I was able to do more for myself a few weeks ago" Patient Stated Goal: Less pain. Home   Cognition  Overall Cognitive Status: Appears within functional limits for tasks assessed/performed Arousal/Alertness: Awake/alert Orientation Level: Appears intact for tasks assessed Behavior During Session: [facility] for  tasks performed    Balance  Balance Balance Assessed: Yes Static Sitting Balance Static Sitting - Balance Support: Feet supported Static Sitting - Level of Assistance: 3: Mod assist Static Sitting - Comment/# of Minutes: Pt with difficulty balance with assist for UEs. Tends to lean to one side and prop on extended extremity. Briefly (10-20 seconds) able to sit unsupported.  End of Session PT - End of Session Activity Tolerance: Patient limited by pain;Patient limited by fatigue Patient left: in bed;with call bell/phone within reach    Rebeca Alert [facility] 06/27/2011, 11:39 AM [phone]

## 2011-06-27 NOTE — Consult Note (Signed)
Patient Identification:  [name] Date of Evaluation:  06/27/2011  Reason for Consult: Evaluate Capacity  Referring Provider:  Dr. Susie Cassette  History of Present Illness:Pt states that she had a problem lifting her leg into bed and called EMS.  She is blind and says she knows where everything is. She also says she has a friend who helps her.  The questions is raised whether she has capacity to care for self.  She becomes very irate "I have a short fuse". Then stops talking.   Past Psychiatric History:None provided   Past Medical History:     Past Medical History  Diagnosis Date  . Hypertension   . Diabetes mellitus   . Blindness   . TIA (transient ischemic attack)   . Anemia        Past Surgical History  Procedure Date  . Gallbladder surgery   . Esophageal dilation     Allergies: No Known Allergies  Current Medications:  Prior to Admission medications   Medication Sig Start Date End Date Taking? Authorizing Provider  amLODipine (NORVASC) 2.5 MG tablet Take 2.5 mg by mouth daily.     Yes Historical Provider, MD  aspirin EC 325 MG tablet Take 325 mg by mouth daily.     Yes Historical Provider, MD  glipiZIDE (GLUCOTROL XL) 10 MG 24 hr tablet Take 10 mg by mouth 2 (two) times daily. Patient takes on one tablet   Yes Historical Provider, MD  ibuprofen (ADVIL,MOTRIN) 200 MG tablet Take 200 mg by mouth every 6 (six) hours as needed.   Yes Historical Provider, MD  lisinopril (PRINIVIL,ZESTRIL) 20 MG tablet Take 20 mg by mouth daily.     Yes Historical Provider, MD    Social History:    reports that she has never smoked. Her smokeless tobacco use includes Snuff. She reports that she does not drink alcohol or use illicit drugs.   Family History:    History reviewed. No pertinent family history.  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  AXIS IV economic problems, housing problems, problems related to social environment, problems with access to health care services and problems with primary support group  AXIS V 61-70 mild symptoms     Assessment/Plan: Pt evaluated ! 10:30 am 06/27/11  Evaluated with Psych CSW Pt reports she lost her vision to DMII complications.  She says she had a talking glucose meter but she misplaced it and does not check her blood glucose.  She only says she cannot get into bed with arthritis in her knee.  She has a friend who describes her as a person who cannot care for self.  She cannot get to bathroom and urinates on towels.  She has been found in urine and feces.  She has a HgbA1c that reveals prolonged blood glucose  out of range' as does the fact that she has lost vision due to uncontrolled blood glucose.  She is adamant that she can take care of herself.  She is fiercely independent, unwilling to admit limitations that blindness and arthritis that have significantly interfered with her desire to maintain self care.  In truth by her physical limitations, abnormal lab values and friend's witnessed account of her daily living conditions document her problems of 'taking care of herself'. She is cognitively intact and is adamant about wanting to return home.  There is a a question of whether she is competent to make this decision.  [employer] representative today states there will be a hearing to submit request for  guardianship assignment for this patient  June 11th.  He reports this pt scored 13 out of 30 of mini  mental status exam.  This score will be taken into consideration when competency, not capacity is determined.  She may want to go home; may say she can take care of herself but  evidence of her recent existence living on her own contradicts adequate medical care of one who has serious conditions of arthritis, blindness and DMII.  She is in need, at the very least, were she to return home,of full time care, or as often each day as possible. RECOMMENDATION:  1. Seek funding to provide home care help. 2. Will discuss with Dr. Susie Cassette.  3. Contact consult if further evaluation is needed.  Earlee Herald J. Ferol Luz, MD Psychiatrist  06/27/2011 11:53 PM

## 2011-06-27 NOTE — Progress Notes (Signed)
Subjective: Complain of chronic knee pain.  No other specific complaints.  Had low blood sugar today.  Objective: Vital signs in last 24 hours: Filed Vitals:   06/26/11 1845 06/26/11 2110 06/27/11 0500 06/27/11 1436  BP: 130/72 145/80 140/68 154/81  Pulse: 69 67 60 71  Temp: 97.4 F (36.3 C) 97.7 F (36.5 C) 97.1 F (36.2 C) 97.4 F (36.3 C)  TempSrc: Oral Oral Axillary Oral  Resp: 18 21 20 20   Height:      Weight:      SpO2: 95% 96% 94% 94%   Weight change:   Intake/Output Summary (Last 24 hours) at 06/27/11 1604 Last data filed at 06/27/11 1554  Gross per 24 hour  Intake   1200 ml  Output    300 ml  Net    900 ml    Physical Exam: General: Awake, Oriented, No acute distress. HEENT: EOMI. Neck: Supple CV: S1 and S2 Lungs: Clear to ascultation bilaterally Abdomen: Soft, Nontender, Nondistended, +bowel sounds. Ext: Good pulses. Trace edema.  Lab Results:  Basename 06/27/11 0535 06/26/11 0455  NA 134* 139  K 4.4 4.6  CL 101 107  CO2 25 22  GLUCOSE 58* 80  BUN 23 21  CREATININE 1.57* 1.46*  CALCIUM 8.4 8.3*  MG -- --  PHOS -- --   No results found for this basename: AST:2,ALT:2,ALKPHOS:2,BILITOT:2,PROT:2,ALBUMIN:2 in the last 72 hours No results found for this basename: LIPASE:2,AMYLASE:2 in the last 72 hours No results found for this basename: WBC:2,NEUTROABS:2,HGB:2,HCT:2,MCV:2,PLT:2 in the last 72 hours  Basename 06/27/11 1135 06/27/11 0535 06/27/11 0015  CKTOTAL 33 28 31  CKMB 2.5 2.2 2.3  CKMBINDEX -- -- --  TROPONINI <0.30 <0.30 <0.30   No components found with this basename: POCBNP:3 No results found for this basename: DDIMER:2 in the last 72 hours No results found for this basename: HGBA1C:2 in the last 72 hours No results found for this basename: CHOL:2,HDL:2,LDLCALC:2,TRIG:2,CHOLHDL:2,LDLDIRECT:2 in the last 72 hours No results found for this basename: TSH,T4TOTAL,FREET3,T3FREE,THYROIDAB in the last 72 hours No results found for this  basename: VITAMINB12:2,FOLATE:2,FERRITIN:2,TIBC:2,IRON:2,RETICCTPCT:2 in the last 72 hours  Micro Results: Recent Results (from the past 240 hour(s))  URINE CULTURE     Status: Normal   Collection Time   06/22/11  7:16 PM      Component Value Range Status Comment   Specimen Description URINE, CLEAN CATCH   Final    Special Requests NONE   Final    Culture  Setup Time [id number]   Final    Colony Count 90,000 COLONIES/ML   Final    Culture     Final    Value: Multiple bacterial morphotypes present, none predominant. Suggest appropriate recollection if clinically indicated.   Report Status 06/24/2011 FINAL   Final   CLOSTRIDIUM DIFFICILE BY PCR     Status: Normal   Collection Time   06/23/11 11:20 AM      Component Value Range Status Comment   C difficile by pcr NEGATIVE  NEGATIVE  Final     Studies/Results: Ct Abdomen Pelvis Wo Contrast  06/26/2011  *RADIOLOGY REPORT*  Clinical Data: Abdominal pain, nausea, anemia, history of renal calculi, prior cholecystectomy, known uterine fibroids  CT ABDOMEN AND PELVIS WITHOUT CONTRAST  Technique:  Multidetector CT imaging of the abdomen and pelvis was performed following the standard protocol without intravenous contrast.  Comparison: 03/19/2011  Findings: Small to moderate bilateral pleural effusions. Associated lower lobe opacities, likely atelectasis.  Cardiomegaly.  Hepatic steatosis.  Spleen, pancreas,  and adrenal glands within normal limits.  Status post cholecystectomy.  No intrahepatic or extrahepatic ductal dilatation.  Multiple nonobstructing bilateral renal calculi measuring up to 5 mm in the right lower pole (series 2/image 43).  Some of the calcifications may actually reflect vascular calcifications.  No hydronephrosis.  No evidence of bowel obstruction.  Normal appendix.  Mild colonic diverticulosis, associated inflammatory changes.  Atherosclerotic calcifications of the abdominal aorta and branch vessels.  Small volume abdominopelvic  ascites.  No suspicious abdominopelvic lymphadenopathy.  Uterus is mildly heterogeneous, likely reflecting known uterine fibroids.  No adnexal masses.  High density fluid in the bladder, possibly reflecting hemorrhage.  Extensive body wall edema.  Mild degenerative changes of the visualized thoracolumbar spine.  IMPRESSION: Multiple nonobstructing calculi measuring up to 5 mm in the right lower pole.  No ureteral or bladder calculi.  No hydronephrosis.  High density fluid in the bladder, possibly reflecting hemorrhage, correlate with urinalysis.  Small to moderate bilateral pleural effusions and extensive body wall edema, suggesting anasarca.  Additional ancillary findings as above.  Original Report Authenticated By: Charline Bills, M.D.   Ir Fluoro Guide Cv Line Right  06/26/2011  *RADIOLOGY REPORT*  Clinical Data: Needs IV access for antibiotics.  PICC LINE PLACEMENT WITH ULTRASOUND AND FLUOROSCOPIC  GUIDANCE  Fluoroscopy Time: 0.5 minutes.  The right arm was prepped with chlorhexidine, draped in the usual sterile fashion using maximum barrier technique (cap and mask, sterile gown, sterile gloves, large sterile sheet, hand hygiene and cutaneous antisepsis) and infiltrated locally with 1% Lidocaine.  Ultrasound demonstrated patency of the right basilic vein, and this was documented with an image.  Under real-time ultrasound guidance, this vein was accessed with a 21 gauge micropuncture needle and image documentation was performed.  The needle was exchanged over a guidewire for a peel-away sheath through which a five [place] dual lumen PICC trimmed to 37 cm was advanced, positioned with its tip at the lower SVC/right atrial junction.  Fluoroscopy during the procedure and fluoro spot radiograph confirms appropriate catheter position.  The catheter was flushed, secured to the skin with Prolene sutures, and covered with a sterile dressing.  Complications:  None  IMPRESSION: Successful right arm PICC line placement  with ultrasound and fluoroscopic guidance.  The catheter is ready for use.  Original Report Authenticated By: Richarda Overlie, M.D.   Ir US Guide Vasc Access Right  06/26/2011  *RADIOLOGY REPORT*  Clinical Data: Needs IV access for antibiotics.  PICC LINE PLACEMENT WITH ULTRASOUND AND FLUOROSCOPIC  GUIDANCE  Fluoroscopy Time: 0.5 minutes.  The right arm was prepped with chlorhexidine, draped in the usual sterile fashion using maximum barrier technique (cap and mask, sterile gown, sterile gloves, large sterile sheet, hand hygiene and cutaneous antisepsis) and infiltrated locally with 1% Lidocaine.  Ultrasound demonstrated patency of the right basilic vein, and this was documented with an image.  Under real-time ultrasound guidance, this vein was accessed with a 21 gauge micropuncture needle and image documentation was performed.  The needle was exchanged over a guidewire for a peel-away sheath through which a five [place] dual lumen PICC trimmed to 37 cm was advanced, positioned with its tip at the lower SVC/right atrial junction.  Fluoroscopy during the procedure and fluoro spot radiograph confirms appropriate catheter position.  The catheter was flushed, secured to the skin with Prolene sutures, and covered with a sterile dressing.  Complications:  None  IMPRESSION: Successful right arm PICC line placement with ultrasound and fluoroscopic guidance.  The catheter is ready  for use.  Original Report Authenticated By: Richarda Overlie, M.D.    Medications: I have reviewed the patient's current medications. Scheduled Meds:   . aspirin EC  325 mg Oral Daily  . carvedilol  3.125 mg Oral Q breakfast  . docusate sodium  100 mg Oral BID  . enoxaparin  60 mg Subcutaneous Q24H  . glipiZIDE  5 mg Oral BID AC  . insulin aspart  0-9 Units Subcutaneous TID WC  . levofloxacin  500 mg Oral Daily  . metoCLOPramide  5 mg Oral TID AC  . metolazone  2.5 mg Oral Once  . saccharomyces boulardii  250 mg Oral BID  . DISCONTD:  furosemide  40 mg Oral Daily  . DISCONTD: glipiZIDE  10 mg Oral BID AC  . DISCONTD: insulin aspart  0-15 Units Subcutaneous TID WC   Continuous Infusions:   . dextrose 5 % and 0.9% NaCl     PRN Meds:.acetaminophen, acetaminophen, albuterol, bisacodyl, HYDROcodone-acetaminophen, ipratropium, ondansetron (ZOFRAN) IV, ondansetron, sodium chloride  Assessment/Plan: Community-acquired pneumonia Continue Levaquin from azithromycin and Rocephin.  Antibiotics since 06/22/2011.  Acute diastolic/systolic heart failure with an EF of 50-55%  Hold lasix given acute renal failure. Was given one dose of Zaroxolyn yesterday. Continue low-dose Coreg.  Diabetes type 2 uncontrolled with complications. Hemoglobin A1c of 9.7 poorly controlled.  Discontinue glipizide for now given hypoglycemia.  Continue sliding scale insulin.  Multiple skin wounds/ Pressure ulcers Continue wound care consultation. Present prior to admission.  Acute renal failure Likely due to diuresis. Discontinue lasix. Continue to monitor.  Abdominal pain with nausea CT of the abdomen and pelvis in February 2013 showed dilated, low-density, mildly heterogeneous endometrial and cervical canals, suggesting the possibility of an obstructing  cervical or endometrial malignancy.  CT of the abdomen and pelvis without contrast on 06/18/2011 showed multiple nonobstructing calculi measuring up to 5 mm in right lower pole, no hydronephrosis, uterus is heterogeneous likely uterine fibroids.  Disposition Patient will need placement as she cannot take care of herself at home. Will need Adult Protective Services to evaluate home safety, living situation.   LOS: 5 days  Adriel Kessen A, MD 06/27/2011, 4:04 PM

## 2011-06-27 NOTE — Progress Notes (Signed)
Clinical Social Work Department CLINICAL SOCIAL WORK PSYCHIATRY SERVICE LINE ASSESSMENT 06/27/2011  Patient:  [name]  Account:  [id number]  Admit Date:  06/22/2011  Clinical Social Worker:  Doroteo Glassman  Date/Time:  06/27/2011 02:14 PM Referred by:  Physician  Date referred:  06/27/2011 Reason for Referral  Behavioral Health Issues   Presenting Symptoms/Problems (In the person's/family's own words):   Concern about capacity.    Abuse/Neglect/Trauma Comments:   Unknown    Psychiatric medications:  None   Current Mental Health Hospitalizations/Previous Mental Health History:   Unknown   Current provider:   none   Place and Date:   Current Medications:   See H&P   Previous Impatient Admission/Date/Reason:   Emotional Health / Current Symptoms    Suicide/Self Harm  None reported   Suicide attempt in the past:   Other harmful behavior:   Psychotic/Dissociative Symptoms  None reported   Other Psychotic/Dissociative Symptoms:    Attention/Behavioral Symptoms  Within Normal Limits   Other Attention / Behavioral Symptoms:    Cognitive Impairment  Poor Judgement   Other Cognitive Impairment:   Pt wanting to live on her own, however she has difficulty caring for herself.    Mood and Adjustment  Guarded    Stress, Anxiety, Trauma, Any Recent Loss/Stressor  None reported   Anxiety (frequency):   Phobia (specify):   Compulsive behavior (specify):   Obsessive behavior (specify):   Other:   Substance Abuse/Use  None   SBIRT completed (please refer for detailed history):    Self-reported substance use:   Urinary Drug Screen Completed:   Alcohol level:    Environmental/Housing/Living Arrangement  Stable housing   Who is in the home:   Patient   Emergency contact:  Daughter   Personnel officer   Patient's Strengths and Goals (patient's own words):   Clinical Social Worker's Interpretive Summary:Psych  Psych MD and psych  CSW met with Pt for a capacity evaluation.  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  Pt reports that she lives alone and has a caretaker in her home.  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  Pt reports that she is able to take her diabetes pills but has "misplaced" her electronic reader that say aloud her sugar number.  She states that this tool has been of great help to her and she intends to find it and use it.  Additionally, Pt states that she needs a smaller bed and intends to down-size from a [place] to a Full.  Pt stated that she is tired of hearing everyone's concern and that people don't know her and what she's capable of doing for herself.  She stated that APS is involved due to a previous caretaker vindicating a grudge, by which she called APS because Pt told this caretaker's employer that the caretaker left 1 hours prior to her shift ending. Pt became upset with psych MD and psych CSW when questioned about her ability to care for herself and would no longer answer questions.  Upon leaving Pt's room, APS worker was present to speak with Pt about Guardianship court date that is to take place on June 11th.  APS worker stated that APS intends to assume guardianship and find appropriate placement for Pt.   Disposition:  Clinical Child psychotherapist signing off  Please re-consult psych if additional needs arise.  Providence Crosby, LCSWA Clinical Social Work [phone]

## 2011-06-27 NOTE — Progress Notes (Signed)
Occupational Therapy Treatment Patient Details Name: [name] MRN: [id number] DOB: [birth year] Today's Date: 06/27/2011 Time: 8657-8469 OT Time Calculation (min): 34 min  OT Assessment / Plan / Recommendation Comments on Treatment Session Pt readily working with OT/PT today.  Requires heavy +2 assist for mobility.  Pt is concerned she will not be able to go back home if she agrees to [facility].    Follow Up Recommendations  Skilled nursing facility    Barriers to Discharge       Equipment Recommendations  Defer to next venue    Recommendations for Other Services    Frequency Min 1X/week   Plan Discharge plan remains appropriate    Precautions / Restrictions Precautions Precautions: Fall (pt is blind) Restrictions Weight Bearing Restrictions: No   Pertinent Vitals/Pain    ADL  Grooming: Performed;Brushing hair;Maximal assistance Where Assessed - Grooming: Supported sitting ADL Comments: Pt c/o pain in R UE since her PICC line was placed and unable to use it for ADL.  Equally ineffective use of LUE when seated at EOB due to poor balance. (Pt used bedpan.  Total assist for hygiene following.)    OT Diagnosis:    OT Problem List:   OT Treatment Interventions:     OT Goals Miscellaneous OT Goals Miscellaneous OT Goal #1: pt will tolerate sitting eob x 10 minutes for adl activities to increase strength/activity tolerance OT Goal: Miscellaneous Goal #1 - Progress: Progressing toward goals  Visit Information  Last OT Received On: 06/27/11 Assistance Needed: +2 PT/OT Co-Evaluation/Treatment: Yes    Subjective Data      Prior Functioning       Cognition  Overall Cognitive Status: Appears within functional limits for tasks assessed/performed Arousal/Alertness: Awake/alert Orientation Level: Appears intact for tasks assessed Behavior During Session: [facility] for tasks performed    Mobility Bed Mobility Bed Mobility: Rolling Right;Rolling Left;Supine to Sit;Sit to  Supine Rolling Right: 2: Max assist;With rail Rolling Left: 2: Max assist;With rail Supine to Sit: 1: +2 Total assist Supine to Sit: Patient Percentage: 20% Sit to Supine: 1: +2 Total assist Sit to Supine: Patient Percentage: 10% Details for Bed Mobility Assistance: VCs technique, hand placement. Assist for bil LE off/onto bed and trunk to upright/supine. Increased time and effortful for pt.  Transfers Details for Transfer Assistance: pt very fearful of falling   Exercises    Balance Balance Balance Assessed: Yes Static Sitting Balance Static Sitting - Balance Support: Feet supported Static Sitting - Level of Assistance: 3: Mod assist Static Sitting - Comment/# of Minutes: Pt with difficulty balance with assist for UEs. Tends to lean to one side and prop on extended extremity. Briefly (10-20 seconds) able to sit unsupported.  End of Session OT - End of Session Activity Tolerance: Patient limited by fatigue Patient left: in bed;with call bell/phone within reach   Evern Bio 06/27/2011, 12:16 PM [phone]

## 2011-06-27 NOTE — Progress Notes (Signed)
Inpatient Diabetes Program Recommendations  AACE/ADA: New Consensus Statement on Inpatient Glycemic Control (2009)  Target Ranges:  Prepandial:   less than 140 mg/dL      Peak postprandial:   less than 180 mg/dL (1-2 hours)      Critically ill patients:  140 - 180 mg/dL   Reason for Visit: Hypoglycemia Results for Andrea, [name] (MRN [id number]) as of 06/27/2011 14:11  Ref. Range 06/26/2011 07:41 06/26/2011 11:48 06/26/2011 16:34 06/26/2011 19:42 06/26/2011 21:15 06/27/2011 08:11 06/27/2011 12:31  Glucose-Capillary Latest Range: 70-99 mg/dL 69 (L) 83 67 (L) 63 (L) 85 44 (LL) 61 (L)     Inpatient Diabetes Program Recommendations Oral Agents: D/C glipizide d/t hypoglycemia HgbA1C: 9.7% uncontrolled  Note: Home diabetes meds will need adjustment.  Will follow.

## 2011-06-28 DIAGNOSIS — D696 Thrombocytopenia, unspecified: Secondary | ICD-10-CM

## 2011-06-28 DIAGNOSIS — E118 Type 2 diabetes mellitus with unspecified complications: Secondary | ICD-10-CM

## 2011-06-28 DIAGNOSIS — N179 Acute kidney failure, unspecified: Secondary | ICD-10-CM

## 2011-06-28 DIAGNOSIS — I10 Essential (primary) hypertension: Secondary | ICD-10-CM

## 2011-06-28 DIAGNOSIS — E1165 Type 2 diabetes mellitus with hyperglycemia: Secondary | ICD-10-CM

## 2011-06-28 LAB — BASIC METABOLIC PANEL
CO2: 27 mEq/L (ref 19–32)
Chloride: 102 mEq/L (ref 96–112)
GFR calc Af Amer: 39 mL/min — ABNORMAL LOW (ref >90)
Potassium: 4.3 mEq/L (ref 3.5–5.1)

## 2011-06-28 LAB — GLUCOSE, CAPILLARY
Glucose-Capillary: 89 mg/dL (ref 70–99)
Glucose-Capillary: 90 mg/dL (ref 70–99)

## 2011-06-28 MED ORDER — GLUCOSE-VITAMIN C 4-6 GM-MG PO CHEW
CHEWABLE_TABLET | ORAL | Status: AC
  Filled 2011-06-28: qty 1

## 2011-06-28 MED ORDER — SALINE SPRAY 0.65 % NA SOLN
1.0000 | Freq: Four times a day (QID) | NASAL | Status: DC | PRN
  Filled 2011-06-28: qty 44

## 2011-06-28 NOTE — Progress Notes (Signed)
Subjective: Patient complaining of nasal congestion.  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  Objective: Vital signs in last 24 hours: Filed Vitals:   06/27/11 0500 06/27/11 1436 06/27/11 2220 06/28/11 0550  BP: 140/68 154/81 138/61 151/72  Pulse: 60 71 72 63  Temp: 97.1 F (36.2 C) 97.4 F (36.3 C) 98.6 F (37 C) 97.5 F (36.4 C)  TempSrc: Axillary Oral Oral Oral  Resp: 20 20 16 17   Height:      Weight:      SpO2: 94% 94% 94% 93%   Weight change:   Intake/Output Summary (Last 24 hours) at 06/28/11 1314 Last data filed at 06/28/11 0900  Gross per 24 hour  Intake 1117.5 ml  Output    300 ml  Net  817.5 ml    Physical Exam: General: Awake, Oriented, No acute distress. HEENT: EOMI. Neck: Supple CV: S1 and S2 Lungs: Clear to ascultation bilaterally Abdomen: Soft, Nontender, Nondistended, +bowel sounds. Ext: Good pulses. Trace edema.  Lab Results:  Basename 06/28/11 0520 06/27/11 0535  NA 138 134*  K 4.3 4.4  CL 102 101  CO2 27 25  GLUCOSE 49* 58*  BUN 25* 23  CREATININE 1.52* 1.57*  CALCIUM 8.7 8.4  MG -- --  PHOS -- --   No results found for this basename: AST:2,ALT:2,ALKPHOS:2,BILITOT:2,PROT:2,ALBUMIN:2 in the last 72 hours No results found for this basename: LIPASE:2,AMYLASE:2 in the last 72 hours No results found for this basename: WBC:2,NEUTROABS:2,HGB:2,HCT:2,MCV:2,PLT:2 in the last 72 hours  Basename 06/27/11 1700 06/27/11 1135 06/27/11 0535  CKTOTAL 32 33 28  CKMB 2.4 2.5 2.2  CKMBINDEX -- -- --  TROPONINI <0.30 <0.30 <0.30   No components found with this basename: POCBNP:3 No results found for this basename: DDIMER:2 in the last 72 hours No results found for this basename: HGBA1C:2 in the last 72 hours No results found for this basename: CHOL:2,HDL:2,LDLCALC:2,TRIG:2,CHOLHDL:2,LDLDIRECT:2 in the last 72 hours No results found for this basename: TSH,T4TOTAL,FREET3,T3FREE,THYROIDAB in the last 72 hours No results found  for this basename: VITAMINB12:2,FOLATE:2,FERRITIN:2,TIBC:2,IRON:2,RETICCTPCT:2 in the last 72 hours  Micro Results: Recent Results (from the past 240 hour(s))  URINE CULTURE     Status: Normal   Collection Time   06/22/11  7:16 PM      Component Value Range Status Comment   Specimen Description URINE, CLEAN CATCH   Final    Special Requests NONE   Final    Culture  Setup Time [id number]   Final    Colony Count 90,000 COLONIES/ML   Final    Culture     Final    Value: Multiple bacterial morphotypes present, none predominant. Suggest appropriate recollection if clinically indicated.   Report Status 06/24/2011 FINAL   Final   CLOSTRIDIUM DIFFICILE BY PCR     Status: Normal   Collection Time   06/23/11 11:20 AM      Component Value Range Status Comment   C difficile by pcr NEGATIVE  NEGATIVE  Final     Studies/Results: Ct Abdomen Pelvis Wo Contrast  06/26/2011  *RADIOLOGY REPORT*  Clinical Data: Abdominal pain, nausea, anemia, history of renal calculi, prior cholecystectomy, known uterine fibroids  CT ABDOMEN AND PELVIS WITHOUT CONTRAST  Technique:  Multidetector CT imaging of the abdomen and pelvis was performed following the standard protocol without intravenous contrast.  Comparison: 03/19/2011  Findings: Small to moderate bilateral pleural effusions. Associated lower lobe opacities, likely atelectasis.  Cardiomegaly.  Hepatic steatosis.  Spleen, pancreas, and adrenal glands within normal  limits.  Status post cholecystectomy.  No intrahepatic or extrahepatic ductal dilatation.  Multiple nonobstructing bilateral renal calculi measuring up to 5 mm in the right lower pole (series 2/image 43).  Some of the calcifications may actually reflect vascular calcifications.  No hydronephrosis.  No evidence of bowel obstruction.  Normal appendix.  Mild colonic diverticulosis, associated inflammatory changes.  Atherosclerotic calcifications of the abdominal aorta and branch vessels.  Small volume  abdominopelvic ascites.  No suspicious abdominopelvic lymphadenopathy.  Uterus is mildly heterogeneous, likely reflecting known uterine fibroids.  No adnexal masses.  High density fluid in the bladder, possibly reflecting hemorrhage.  Extensive body wall edema.  Mild degenerative changes of the visualized thoracolumbar spine.  IMPRESSION: Multiple nonobstructing calculi measuring up to 5 mm in the right lower pole.  No ureteral or bladder calculi.  No hydronephrosis.  High density fluid in the bladder, possibly reflecting hemorrhage, correlate with urinalysis.  Small to moderate bilateral pleural effusions and extensive body wall edema, suggesting anasarca.  Additional ancillary findings as above.  Original Report Authenticated By: Charline Bills, M.D.   Ir Fluoro Guide Cv Line Right  06/26/2011  *RADIOLOGY REPORT*  Clinical Data: Needs IV access for antibiotics.  PICC LINE PLACEMENT WITH ULTRASOUND AND FLUOROSCOPIC  GUIDANCE  Fluoroscopy Time: 0.5 minutes.  The right arm was prepped with chlorhexidine, draped in the usual sterile fashion using maximum barrier technique (cap and mask, sterile gown, sterile gloves, large sterile sheet, hand hygiene and cutaneous antisepsis) and infiltrated locally with 1% Lidocaine.  Ultrasound demonstrated patency of the right basilic vein, and this was documented with an image.  Under real-time ultrasound guidance, this vein was accessed with a 21 gauge micropuncture needle and image documentation was performed.  The needle was exchanged over a guidewire for a peel-away sheath through which a five [place] dual lumen PICC trimmed to 37 cm was advanced, positioned with its tip at the lower SVC/right atrial junction.  Fluoroscopy during the procedure and fluoro spot radiograph confirms appropriate catheter position.  The catheter was flushed, secured to the skin with Prolene sutures, and covered with a sterile dressing.  Complications:  None  IMPRESSION: Successful right arm PICC  line placement with ultrasound and fluoroscopic guidance.  The catheter is ready for use.  Original Report Authenticated By: Richarda Overlie, M.D.   Ir US Guide Vasc Access Right  06/26/2011  *RADIOLOGY REPORT*  Clinical Data: Needs IV access for antibiotics.  PICC LINE PLACEMENT WITH ULTRASOUND AND FLUOROSCOPIC  GUIDANCE  Fluoroscopy Time: 0.5 minutes.  The right arm was prepped with chlorhexidine, draped in the usual sterile fashion using maximum barrier technique (cap and mask, sterile gown, sterile gloves, large sterile sheet, hand hygiene and cutaneous antisepsis) and infiltrated locally with 1% Lidocaine.  Ultrasound demonstrated patency of the right basilic vein, and this was documented with an image.  Under real-time ultrasound guidance, this vein was accessed with a 21 gauge micropuncture needle and image documentation was performed.  The needle was exchanged over a guidewire for a peel-away sheath through which a five [place] dual lumen PICC trimmed to 37 cm was advanced, positioned with its tip at the lower SVC/right atrial junction.  Fluoroscopy during the procedure and fluoro spot radiograph confirms appropriate catheter position.  The catheter was flushed, secured to the skin with Prolene sutures, and covered with a sterile dressing.  Complications:  None  IMPRESSION: Successful right arm PICC line placement with ultrasound and fluoroscopic guidance.  The catheter is ready for use.  Original Report  Authenticated By: Richarda Overlie, M.D.    Medications: I have reviewed the patient's current medications. Scheduled Meds:    . aspirin EC  325 mg Oral Daily  . carvedilol  3.125 mg Oral Q breakfast  . docusate sodium  100 mg Oral BID  . enoxaparin  60 mg Subcutaneous Q24H  . insulin aspart  0-9 Units Subcutaneous TID WC  . levofloxacin  500 mg Oral Daily  . metoCLOPramide  5 mg Oral TID AC  . saccharomyces boulardii  250 mg Oral BID  . DISCONTD: furosemide  40 mg Oral Daily  . DISCONTD: glipiZIDE  5  mg Oral BID AC   Continuous Infusions:    . dextrose 5 % and 0.9% NaCl 30 mL/hr (06/27/11 1845)   PRN Meds:.acetaminophen, acetaminophen, albuterol, bisacodyl, HYDROcodone-acetaminophen, ipratropium, ondansetron (ZOFRAN) IV, ondansetron, sodium chloride  Assessment/Plan: Community-acquired pneumonia Continue Levaquin transitioned from azithromycin and Rocephin.  Antibiotics since 06/22/2011.  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 Was given one dose of Zaroxolyn on 06/26/2011. Continue low-dose Coreg.  Continue aspirin.  Diabetes type 2 uncontrolled with complications. Hemoglobin A1c of 9.7 poorly controlled.  Discontinued glipizide for now given hypoglycemia on 06/27/2011.  Continue sliding scale insulin.  Multiple skin wounds/ Pressure ulcers Continue wound care consultation. Present prior to admission.  Acute renal failure Likely due to diuresis. Discontinued lasix. Continue to monitor.  Hypertension Stable, blood pressure slightly elevated continue carvedilol.  Abdominal pain with nausea CT of the abdomen and pelvis in February 2013 showed dilated, low-density, mildly heterogeneous endometrial and cervical canals, suggesting the possibility of an obstructing  cervical or endometrial malignancy.  CT of the abdomen and pelvis without contrast on 06/18/2011 showed multiple nonobstructing calculi measuring up to 5 mm in right lower pole, no hydronephrosis, uterus is heterogeneous likely uterine fibroids.  Disposition Patient will need placement as she cannot take care of herself at home. Will need Adult Protective Services to evaluate home safety, living situation after discharge.  Appreciate psychiatry help.   LOS: 6 days  Teoman Giraud A, MD 06/28/2011, 1:14 PM

## 2011-06-29 DIAGNOSIS — N179 Acute kidney failure, unspecified: Secondary | ICD-10-CM | POA: Diagnosis present

## 2011-06-29 LAB — BASIC METABOLIC PANEL
CO2: 28 mEq/L (ref 19–32)
Chloride: 100 mEq/L (ref 96–112)
Creatinine, Ser: 1.49 mg/dL — ABNORMAL HIGH (ref 0.50–1.10)
GFR calc Af Amer: 40 mL/min — ABNORMAL LOW (ref >90)
Potassium: 4.5 mEq/L (ref 3.5–5.1)

## 2011-06-29 LAB — GLUCOSE, CAPILLARY: Glucose-Capillary: 99 mg/dL (ref 70–99)

## 2011-06-29 MED ORDER — IPRATROPIUM BROMIDE 0.02 % IN SOLN: 0.5000 mg | RESPIRATORY_TRACT | Status: DC | PRN

## 2011-06-29 MED ORDER — SACCHAROMYCES BOULARDII 250 MG PO CAPS: 250.0000 mg | ORAL_CAPSULE | Freq: Two times a day (BID) | ORAL | Status: AC

## 2011-06-29 MED ORDER — INSULIN ASPART 100 UNIT/ML ~~LOC~~ SOLN: 0.0000 [IU] | Freq: Three times a day (TID) | SUBCUTANEOUS | Status: DC

## 2011-06-29 MED ORDER — ALBUTEROL SULFATE (5 MG/ML) 0.5% IN NEBU: 2.5000 mg | INHALATION_SOLUTION | RESPIRATORY_TRACT | Status: DC | PRN

## 2011-06-29 MED ORDER — SALINE SPRAY 0.65 % NA SOLN: 1.0000 | Freq: Four times a day (QID) | NASAL | Status: DC | PRN

## 2011-06-29 MED ORDER — CARVEDILOL 3.125 MG PO TABS: 3.1250 mg | ORAL_TABLET | Freq: Every day | ORAL | Status: DC

## 2011-06-29 MED ORDER — DSS 100 MG PO CAPS: 100.0000 mg | ORAL_CAPSULE | Freq: Two times a day (BID) | ORAL | Status: AC

## 2011-06-29 MED ORDER — METOCLOPRAMIDE HCL 5 MG PO TABS: 5.0000 mg | ORAL_TABLET | Freq: Three times a day (TID) | ORAL | Status: DC

## 2011-06-29 MED ORDER — LEVOFLOXACIN 500 MG PO TABS: 500.0000 mg | ORAL_TABLET | Freq: Every day | ORAL | Status: AC

## 2011-06-29 MED ORDER — BISACODYL 10 MG RE SUPP: 10.0000 mg | Freq: Every day | RECTAL | Status: AC | PRN

## 2011-06-29 MED ORDER — ONDANSETRON HCL 4 MG PO TABS: 4.0000 mg | ORAL_TABLET | Freq: Four times a day (QID) | ORAL | Status: AC | PRN

## 2011-06-29 MED ORDER — ACETAMINOPHEN 325 MG PO TABS: 650.0000 mg | ORAL_TABLET | Freq: Four times a day (QID) | ORAL | Status: AC | PRN

## 2011-06-29 MED ORDER — HYDROCODONE-ACETAMINOPHEN 5-325 MG PO TABS: 1.0000 | ORAL_TABLET | Freq: Four times a day (QID) | ORAL | Status: AC | PRN

## 2011-06-29 NOTE — Progress Notes (Signed)
Subjective: Nasal congestion improved on saline spray.  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  Objective: Vital signs in last 24 hours: Filed Vitals:   06/28/11 1424 06/28/11 2102 06/29/11 0606 06/29/11 0931  BP: 154/82 132/79 137/82 143/77  Pulse: 73 76 70 67  Temp: 97.9 F (36.6 C) 98.6 F (37 C) 97.8 F (36.6 C) 97.6 F (36.4 C)  TempSrc: Oral Oral Oral Oral  Resp: 18 18 19 18   Height:      Weight:      SpO2: 99% 92% 97% 90%   Weight change:   Intake/Output Summary (Last 24 hours) at 06/29/11 1346 Last data filed at 06/29/11 0900  Gross per 24 hour  Intake   1260 ml  Output    200 ml  Net   1060 ml    Physical Exam: General: Awake, Oriented, No acute distress. HEENT: EOMI. Neck: Supple CV: S1 and S2 Lungs: Clear to ascultation bilaterally Abdomen: Soft, Nontender, Nondistended, +bowel sounds. Ext: Good pulses. Trace edema.  Lab Results:  Basename 06/29/11 0531 06/28/11 0520  NA 136 138  K 4.5 4.3  CL 100 102  CO2 28 27  GLUCOSE 113* 49*  BUN 28* 25*  CREATININE 1.49* 1.52*  CALCIUM 8.6 8.7  MG -- --  PHOS -- --   No results found for this basename: AST:2,ALT:2,ALKPHOS:2,BILITOT:2,PROT:2,ALBUMIN:2 in the last 72 hours No results found for this basename: LIPASE:2,AMYLASE:2 in the last 72 hours No results found for this basename: WBC:2,NEUTROABS:2,HGB:2,HCT:2,MCV:2,PLT:2 in the last 72 hours  Basename 06/27/11 1700 06/27/11 1135 06/27/11 0535  CKTOTAL 32 33 28  CKMB 2.4 2.5 2.2  CKMBINDEX -- -- --  TROPONINI <0.30 <0.30 <0.30   No components found with this basename: POCBNP:3 No results found for this basename: DDIMER:2 in the last 72 hours No results found for this basename: HGBA1C:2 in the last 72 hours No results found for this basename: CHOL:2,HDL:2,LDLCALC:2,TRIG:2,CHOLHDL:2,LDLDIRECT:2 in the last 72 hours No results found for this basename: TSH,T4TOTAL,FREET3,T3FREE,THYROIDAB in the last 72 hours No results found for  this basename: VITAMINB12:2,FOLATE:2,FERRITIN:2,TIBC:2,IRON:2,RETICCTPCT:2 in the last 72 hours  Micro Results: Recent Results (from the past 240 hour(s))  URINE CULTURE     Status: Normal   Collection Time   06/22/11  7:16 PM      Component Value Range Status Comment   Specimen Description URINE, CLEAN CATCH   Final    Special Requests NONE   Final    Culture  Setup Time [id number]   Final    Colony Count 90,000 COLONIES/ML   Final    Culture     Final    Value: Multiple bacterial morphotypes present, none predominant. Suggest appropriate recollection if clinically indicated.   Report Status 06/24/2011 FINAL   Final   CLOSTRIDIUM DIFFICILE BY PCR     Status: Normal   Collection Time   06/23/11 11:20 AM      Component Value Range Status Comment   C difficile by pcr NEGATIVE  NEGATIVE  Final     Studies/Results: No results found.  Medications: I have reviewed the patient's current medications. Scheduled Meds:    . aspirin EC  325 mg Oral Daily  . carvedilol  3.125 mg Oral Q breakfast  . docusate sodium  100 mg Oral BID  . enoxaparin  60 mg Subcutaneous Q24H  . glucose-Vitamin C      . insulin aspart  0-9 Units Subcutaneous TID WC  . levofloxacin  500 mg Oral Daily  .  metoCLOPramide  5 mg Oral TID AC  . saccharomyces boulardii  250 mg Oral BID   Continuous Infusions:    . dextrose 5 % and 0.9% NaCl 30 mL/hr (06/27/11 1845)   PRN Meds:.acetaminophen, acetaminophen, albuterol, bisacodyl, HYDROcodone-acetaminophen, ipratropium, ondansetron (ZOFRAN) IV, ondansetron, sodium chloride, sodium chloride  Assessment/Plan: Community-acquired pneumonia Continue Levaquin transitioned from azithromycin and Rocephin.  Antibiotics since 06/22/2011.  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 Was given one dose of Zaroxolyn on 06/26/2011. Continue low-dose Coreg.  Continue aspirin.  Diabetes type 2  uncontrolled with complications. Hemoglobin A1c of 9.7 poorly controlled.  Discontinued glipizide for now given hypoglycemia on 06/27/2011.  Continue sliding scale insulin.  Multiple skin wounds/ Pressure ulcers Continue wound care consultation. Present prior to admission.  Acute renal failure Likely due to diuresis. Discontinued lasix. Continue to monitor.  Hypertension Stable, blood pressure slightly elevated continue carvedilol.  Abdominal pain with nausea CT of the abdomen and pelvis in February 2013 showed dilated, low-density, mildly heterogeneous endometrial and cervical canals, suggesting the possibility of an obstructing  cervical or endometrial malignancy.  CT of the abdomen and pelvis without contrast on 06/18/2011 showed multiple nonobstructing calculi measuring up to 5 mm in right lower pole, no hydronephrosis, uterus is heterogeneous likely uterine fibroids.  Disposition Patient will need placement as she cannot take care of herself at home. Will need Adult Protective Services to evaluate home safety, living situation after discharge.  Appreciate psychiatry help.  Patient medically ready for discharge, pending insurance authorization.   LOS: 7 days  Gustav Knueppel A, MD 06/29/2011, 1:46 PM

## 2011-06-29 NOTE — Progress Notes (Signed)
Patient discharged to [facility], alert and oriented, transported by [facility], discharge package given to transportation services for delivery to SNF, report called to receiving Nurse, patient in stable condition at this time

## 2011-06-29 NOTE — Progress Notes (Signed)
Physical Therapy Treatment Patient Details Name: [name] MRN: [id number] DOB: [birth year] Today's Date: 06/29/2011 Time: 7829-5621 PT Time Calculation (min): 25 min  PT Assessment / Plan / Recommendation Comments on Treatment Session  Pt only agreed to bed exercises stating her legs hurt to bad. RN states pt is refusing SNF and wants to go home.  If so pt will need 24/7 assist.    Follow Up Recommendations  Skilled nursing facility    Barriers to Discharge        Equipment Recommendations  Defer to next venue    Recommendations for Other Services    Frequency Min 3X/week   Plan Discharge plan remains appropriate    Precautions / Restrictions     Pertinent Vitals/Pain C/o B LE pain hips and knees "arthritis"    Mobility    Pt declined OOB act   Exercises General Exercises - Lower Extremity Ankle Circles/Pumps: AAROM;Both;10 reps;Supine Quad Sets: AAROM;Both;10 reps;Supine Gluteal Sets: AROM;Both;10 reps;Supine Heel Slides: AAROM;Both;10 reps;Supine Straight Leg Raises: AROM;Both;10 reps;Supine      Visit Information  Last PT Received On: 06/29/11 Assistance Needed: +2    Subjective Data  Subjective: My legs hurt too bad, I can't stand up on them Patient Stated Goal: go home   Cognition    good   Balance   NT  End of Session PT - End of Session Activity Tolerance: Patient limited by fatigue Patient left: in bed;with call bell/phone within reach    Felecia Shelling  PTA [facility]  Acute  Rehab Pager     [phone]

## 2011-06-29 NOTE — Progress Notes (Addendum)
CSW met with patient. Patient is alert and oriented x3. CSW discussed need for SNF. Patient is reluctantly agreeable as she states that she knows she is not able to care for herself. CSW discussed bed offers. Patient is agreeable to [facility]. Request for auth faxed in to [employer]. Pending authorization. Klaus Casteneda C. Dayane Hillenburg MSW, LCSW [phone] [facility] medicare gave authorization. Patient is agreeable to transfer. Packet copied and placed in [place].

## 2011-06-29 NOTE — Discharge Summary (Signed)
Discharge Summary  [name] MR#: [id number]  DOB:February 22, 1940  Date of Admission: 06/22/2011 Date of Discharge: 06/29/2011  Patient's PCP: Dow Adolph, MD, MD  Attending Physician:Quientin Jent A  Consults: Dr. Ferol Luz, psychiatry  Discharge Diagnoses: Principal Problem:  *PNA (pneumonia) Active Problems:  DM  BLINDNESS, BILATERAL  ESSENTIAL HYPERTENSION  CAP (community acquired pneumonia)  Osteoarthritis of both knees  Acute renal failure  Brief Admitting History and Physical 71 yr old F who [facility] of [facility] comes in with weakness and 3 days of diarrhea not associated with nausea or vomiting on 06/22/2011.  Discharge Medications Medication List  As of 06/29/2011  2:43 PM   STOP taking these medications         amLODipine 2.5 MG tablet      glipiZIDE 10 MG 24 hr tablet      HYDROcodone-acetaminophen 5-500 MG per tablet      ibuprofen 200 MG tablet      lisinopril 20 MG tablet         TAKE these medications         acetaminophen 325 MG tablet   Commonly known as: TYLENOL   Take 2 tablets (650 mg total) by mouth every 6 (six) hours as needed (or Fever >/= 101).      albuterol (5 MG/ML) 0.5% nebulizer solution   Commonly known as: PROVENTIL   Take 0.5 mLs (2.5 mg total) by nebulization every 4 (four) hours as needed for wheezing.      aspirin EC 325 MG tablet   Take 325 mg by mouth daily.      bisacodyl 10 MG suppository   Commonly known as: DULCOLAX   Place 1 suppository (10 mg total) rectally daily as needed.      carvedilol 3.125 MG tablet   Commonly known as: COREG   Take 1 tablet (3.125 mg total) by mouth daily with breakfast.      DSS 100 MG Caps   Take 100 mg by mouth 2 (two) times daily.      HYDROcodone-acetaminophen 5-325 MG per tablet   Commonly known as: NORCO   Take 1 tablet by mouth every 6 (six) hours as needed.      insulin aspart 100 UNIT/ML injection   Commonly known as: novoLOG   Inject 0-9 Units into the  skin 3 (three) times daily with meals. CBG < 70: implement hypoglycemia protocol  CBG 70 - 120: 0 units  CBG 121 - 150: 1 unit  CBG 151 - 200: 2 units  CBG 201 - 250: 3 units  CBG 251 - 300: 5 units  CBG 301 - 350: 7 units  CBG 351 - 400: 9 units  CBG > 400 call MD.      ipratropium 0.02 % nebulizer solution   Commonly known as: ATROVENT   Take 2.5 mLs (0.5 mg total) by nebulization every 4 (four) hours as needed.      levofloxacin 500 MG tablet   Commonly known as: LEVAQUIN   Take 1 tablet (500 mg total) by mouth daily. Discontinue after 07/01/2011.      metoCLOPramide 5 MG tablet   Commonly known as: REGLAN   Take 1 tablet (5 mg total) by mouth 3 (three) times daily before meals.      ondansetron 4 MG tablet   Commonly known as: ZOFRAN   Take 1 tablet (4 mg total) by mouth every 6 (six) hours as needed for nausea.      saccharomyces boulardii 250  MG capsule   Commonly known as: FLORASTOR   Take 1 capsule (250 mg total) by mouth 2 (two) times daily.      sodium chloride 0.65 % Soln nasal spray   Commonly known as: OCEAN   Place 1 spray into the nose 4 (four) times daily as needed for congestion.           Hospital Course: Community-acquired pneumonia  Continue Levaquin transitioned from azithromycin and Rocephin. Antibiotics since 06/22/2011.  Discontinue levofloxacin after 07/01/2011.  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  Hold lasix given acute renal failure. Was given one dose of Zaroxolyn on 06/26/2011. Continue low-dose Coreg. Continue aspirin.  Once renal function improves consider restarting Lasix at 20 mg daily and titrate to twice a day with improvement in renal function.  Start lisinopril 5 mg daily and titrate once renal function has improved.  Diabetes type 2  uncontrolled with complications.  Hemoglobin A1c of 9.7 poorly controlled. Discontinued glipizide for now given hypoglycemia on 06/27/2011. Continue sliding scale insulin for now.  Start glipizide 2.5 mg daily once renal function improves, titrate as tolerated depending on blood sugars.  Multiple skin wounds/ Pressure ulcers  Continue wound care consultation. Present prior to admission.   Acute renal failure  Likely due to diuresis. Discontinued lasix.  Please check electrolytes/B. meds on 07/02/2011.  Hypertension  Stable, blood pressure slightly elevated continue carvedilol.   Abdominal pain with nausea  CT of the abdomen and pelvis in February 2013 showed dilated, low-density, mildly heterogeneous endometrial and cervical canals, suggesting the possibility of an obstructing cervical or endometrial malignancy. CT of the abdomen and pelvis without contrast on 06/18/2011 showed multiple nonobstructing calculi measuring up to 5 mm in right lower pole, no hydronephrosis, uterus is heterogeneous likely uterine fibroids.  Patient on Reglan, consider discontinuing this medication in one month.    Disposition  To skilled nursing facility. Will need Adult Protective Services to evaluate home safety, living situation after discharge.  Psychiatry, Dr. Ferol Luz, helped with assessing patient's home situation and capacity.   Day of Discharge BP 147/76  Pulse 68  Temp(Src) 98.4 F (36.9 C) (Oral)  Resp 18  Ht 5\' 2"  (1.575 m)  Wt 116.8 kg (257 lb 8 oz)  BMI 47.10 kg/m2  SpO2 93%  Results for orders placed during the hospital encounter of 06/22/11 (from the past 48 hour(s))  CARDIAC PANEL(CRET KIN+CKTOT+MB+TROPI)     Status: Normal   Collection Time   06/27/11  5:00 PM      Component Value Range Comment   Total CK 32  7 - 177 (U/L)    CK, MB 2.4  0.3 - 4.0 (ng/mL)    Troponin I <0.30  <0.30 (ng/mL)    Relative Index RELATIVE INDEX IS INVALID  0.0 - 2.5    GLUCOSE, CAPILLARY     Status: Normal    Collection Time   06/27/11  9:57 PM      Component Value Range Comment   Glucose-Capillary 92  70 - 99 (mg/dL)   BASIC METABOLIC PANEL     Status: Abnormal   Collection Time   06/28/11  5:20 AM      Component Value Range Comment   Sodium 138  135 - 145 (mEq/L)    Potassium 4.3  3.5 - 5.1 (mEq/L)  Chloride 102  96 - 112 (mEq/L)    CO2 27  19 - 32 (mEq/L)    Glucose, Bld 49 (*) 70 - 99 (mg/dL)    BUN 25 (*) 6 - 23 (mg/dL)    Creatinine, Ser 4.03 (*) 0.50 - 1.10 (mg/dL)    Calcium 8.7  8.4 - 10.5 (mg/dL)    GFR calc non Af Amer 34 (*) >90 (mL/min)    GFR calc Af Amer 39 (*) >90 (mL/min)   GLUCOSE, CAPILLARY     Status: Abnormal   Collection Time   06/28/11  7:27 AM      Component Value Range Comment   Glucose-Capillary 67 (*) 70 - 99 (mg/dL)   GLUCOSE, CAPILLARY     Status: Normal   Collection Time   06/28/11 10:56 AM      Component Value Range Comment   Glucose-Capillary 89  70 - 99 (mg/dL)   GLUCOSE, CAPILLARY     Status: Normal   Collection Time   06/28/11  4:50 PM      Component Value Range Comment   Glucose-Capillary 90  70 - 99 (mg/dL)   GLUCOSE, CAPILLARY     Status: Normal   Collection Time   06/28/11  9:55 PM      Component Value Range Comment   Glucose-Capillary 90  70 - 99 (mg/dL)    Comment 1 Documented in Chart      Comment 2 Notify RN     BASIC METABOLIC PANEL     Status: Abnormal   Collection Time   06/29/11  5:31 AM      Component Value Range Comment   Sodium 136  135 - 145 (mEq/L)    Potassium 4.5  3.5 - 5.1 (mEq/L)    Chloride 100  96 - 112 (mEq/L)    CO2 28  19 - 32 (mEq/L)    Glucose, Bld 113 (*) 70 - 99 (mg/dL)    BUN 28 (*) 6 - 23 (mg/dL)    Creatinine, Ser 4.74 (*) 0.50 - 1.10 (mg/dL)    Calcium 8.6  8.4 - 10.5 (mg/dL)    GFR calc non Af Amer 35 (*) >90 (mL/min)    GFR calc Af Amer 40 (*) >90 (mL/min)   GLUCOSE, CAPILLARY     Status: Normal   Collection Time   06/29/11  7:52 AM      Component Value Range Comment   Glucose-Capillary 99  70 - 99  (mg/dL)    Comment 1 Notify RN     GLUCOSE, CAPILLARY     Status: Abnormal   Collection Time   06/29/11 11:52 AM      Component Value Range Comment   Glucose-Capillary 141 (*) 70 - 99 (mg/dL)    Comment 1 Notify RN       Ct Abdomen Pelvis Wo Contrast  06/26/2011  *RADIOLOGY REPORT*  Clinical Data: Abdominal pain, nausea, anemia, history of renal calculi, prior cholecystectomy, known uterine fibroids  CT ABDOMEN AND PELVIS WITHOUT CONTRAST  Technique:  Multidetector CT imaging of the abdomen and pelvis was performed following the standard protocol without intravenous contrast.  Comparison: 03/19/2011  Findings: Small to moderate bilateral pleural effusions. Associated lower lobe opacities, likely atelectasis.  Cardiomegaly.  Hepatic steatosis.  Spleen, pancreas, and adrenal glands within normal limits.  Status post cholecystectomy.  No intrahepatic or extrahepatic ductal dilatation.  Multiple nonobstructing bilateral renal calculi measuring up to 5 mm in the right lower pole (series 2/image 43).  Some  of the calcifications may actually reflect vascular calcifications.  No hydronephrosis.  No evidence of bowel obstruction.  Normal appendix.  Mild colonic diverticulosis, associated inflammatory changes.  Atherosclerotic calcifications of the abdominal aorta and branch vessels.  Small volume abdominopelvic ascites.  No suspicious abdominopelvic lymphadenopathy.  Uterus is mildly heterogeneous, likely reflecting known uterine fibroids.  No adnexal masses.  High density fluid in the bladder, possibly reflecting hemorrhage.  Extensive body wall edema.  Mild degenerative changes of the visualized thoracolumbar spine.  IMPRESSION: Multiple nonobstructing calculi measuring up to 5 mm in the right lower pole.  No ureteral or bladder calculi.  No hydronephrosis.  High density fluid in the bladder, possibly reflecting hemorrhage, correlate with urinalysis.  Small to moderate bilateral pleural effusions and extensive  body wall edema, suggesting anasarca.  Additional ancillary findings as above.  Original Report Authenticated By: Charline Bills, M.D.   Dg Chest 2 View  06/22/2011  *RADIOLOGY REPORT*  Clinical Data: Short of breath.  CHEST - 2 VIEW  Comparison: 12/25/2008  Findings: Right lower lobe airspace disease is seen with lesser degree of infiltrate or atelectasis in the right upper lobe.  This is suspicious for pneumonia.  Left lung is grossly clear.  Heart size within normal limits allowing for low lung volumes and lordotic positioning peri  IMPRESSION: Right lower lobe consolidation and right upper lobe atelectasis versus infiltrate, suspicious for pneumonia.  Original Report Authenticated By: Danae Orleans, M.D.   Dg Knee 1-2 Views Left  06/25/2011  *RADIOLOGY REPORT*  Clinical Data: Left knee pain, arthritis  LEFT KNEE - 1-2 VIEW  Comparison: 06/13/2011  Findings: Suboptimal positioning of the frontal radiograph.  No fracture or dislocation is seen.  Severe tricompartmental degenerative changes.  Small suprapatellar knee joint effusion.  Vascular calcifications.  IMPRESSION: Severe tricompartmental degenerative changes with small suprapatellar knee joint effusion.  Original Report Authenticated By: Charline Bills, M.D.   Dg Knee 1-2 Views Right  06/25/2011  *RADIOLOGY REPORT*  Clinical Data: Right knee arthritis, pain  RIGHT KNEE - 1-2 VIEW  Comparison: 05/09/2011  Findings: Severe tricompartmental degenerative changes.  No fracture or dislocation is seen.  Small suprapatellar knee joint effusion.  Vascular calcifications.  IMPRESSION: Severe tricompartmental degenerative changes with small suprapatellar knee joint effusion.  Original Report Authenticated By: Charline Bills, M.D.   Ct Angio Chest W/cm &/or Wo Cm  06/23/2011  *RADIOLOGY REPORT*  Clinical Data: Cough, diarrhea, weakness  CT ANGIOGRAPHY CHEST  Technique:  Multidetector CT imaging of the chest using the standard protocol during bolus  administration of intravenous contrast. Multiplanar reconstructed images including MIPs were obtained and reviewed to evaluate the vascular anatomy.  Contrast: OMNIPAQUE IOHEXOL 300 MG/ML  SOLN  Comparison: Chest radiographs dated 06/22/2010  Findings: No evidence of pulmonary embolism.  Suspected mild interstitial edema.  Moderate bilateral pleural effusions.  Patchy posterior right upper lobe and bilateral lower lobe opacities, possibly atelectasis.  Right upper lobe pneumonia is not entirely excluded.  Visualized thyroid is unremarkable.  Cardiomegaly.  No pericardial effusion.  Coronary atherosclerosis. Mild atherosclerotic calcifications of the aortic arch.  Visualized upper abdomen unremarkable.  Degenerative changes of the visualized thoracolumbar spine.  IMPRESSION: No evidence of pulmonary embolism.  Cardiomegaly with suspected mild interstitial edema and moderate bilateral pleural effusions.  Patchy posterior right upper lobe and bilateral lower lobe opacities, possibly atelectasis.  Right upper lobe pneumonia is not entirely excluded.  Original Report Authenticated By: Charline Bills, M.D.   Ir Fluoro Guide Cv Line Right  06/26/2011  *RADIOLOGY REPORT*  Clinical Data: Needs IV access for antibiotics.  PICC LINE PLACEMENT WITH ULTRASOUND AND FLUOROSCOPIC  GUIDANCE  Fluoroscopy Time: 0.5 minutes.  The right arm was prepped with chlorhexidine, draped in the usual sterile fashion using maximum barrier technique (cap and mask, sterile gown, sterile gloves, large sterile sheet, hand hygiene and cutaneous antisepsis) and infiltrated locally with 1% Lidocaine.  Ultrasound demonstrated patency of the right basilic vein, and this was documented with an image.  Under real-time ultrasound guidance, this vein was accessed with a 21 gauge micropuncture needle and image documentation was performed.  The needle was exchanged over a guidewire for a peel-away sheath through which a five [place] dual lumen PICC  trimmed to 37 cm was advanced, positioned with its tip at the lower SVC/right atrial junction.  Fluoroscopy during the procedure and fluoro spot radiograph confirms appropriate catheter position.  The catheter was flushed, secured to the skin with Prolene sutures, and covered with a sterile dressing.  Complications:  None  IMPRESSION: Successful right arm PICC line placement with ultrasound and fluoroscopic guidance.  The catheter is ready for use.  Original Report Authenticated By: Richarda Overlie, M.D.   Ir US Guide Vasc Access Right  06/26/2011  *RADIOLOGY REPORT*  Clinical Data: Needs IV access for antibiotics.  PICC LINE PLACEMENT WITH ULTRASOUND AND FLUOROSCOPIC  GUIDANCE  Fluoroscopy Time: 0.5 minutes.  The right arm was prepped with chlorhexidine, draped in the usual sterile fashion using maximum barrier technique (cap and mask, sterile gown, sterile gloves, large sterile sheet, hand hygiene and cutaneous antisepsis) and infiltrated locally with 1% Lidocaine.  Ultrasound demonstrated patency of the right basilic vein, and this was documented with an image.  Under real-time ultrasound guidance, this vein was accessed with a 21 gauge micropuncture needle and image documentation was performed.  The needle was exchanged over a guidewire for a peel-away sheath through which a five [place] dual lumen PICC trimmed to 37 cm was advanced, positioned with its tip at the lower SVC/right atrial junction.  Fluoroscopy during the procedure and fluoro spot radiograph confirms appropriate catheter position.  The catheter was flushed, secured to the skin with Prolene sutures, and covered with a sterile dressing.  Complications:  None  IMPRESSION: Successful right arm PICC line placement with ultrasound and fluoroscopic guidance.  The catheter is ready for use.  Original Report Authenticated By: Richarda Overlie, M.D.   Dg Knee Complete 4 Views Left  06/13/2011  *RADIOLOGY REPORT*  Clinical Data: Fall.  Knee pain and swelling.  LEFT  KNEE - COMPLETE 4+ VIEW  Comparison: None.  Findings: Prominent articular space narrowing is present in the medial compartment with prominent associated spurring.  Severe patellofemoral spurring is present with a small knee effusion.  In comparison, the lateral compartmental degenerative findings are minimal.  Vascular calcifications noted.  No discrete fracture is observed. Spurring of the tibial spine noted.  IMPRESSION:  1.  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  Mild osteoarthritis in the lateral compartment. 2.  Small knee effusion. 3.  Atherosclerosis.  Original Report Authenticated By: Dellia Cloud, M.D.     Disposition: SNF with PT/OT.  Diet: Diabetic diet.  Activity: Resume as tolerated.    Follow-up Appts: Discharge Orders    Future Orders Please Complete By Expires   Diet Carb Modified      Diet - low sodium heart healthy      Increase activity slowly      Heart Failure  patients record your daily weight using the same scale at the same time of day      Contraindication to ACEI at discharge         TESTS THAT NEED FOLLOW-UP None  Time spent on discharge, talking to the patient, and coordinating care: 35 mins.   Signed: Cristal Ford, MD 06/29/2011, 2:43 PM

## 2012-02-14 ENCOUNTER — Emergency Department (HOSPITAL_COMMUNITY): Payer: Medicare Other

## 2012-02-14 ENCOUNTER — Encounter (HOSPITAL_COMMUNITY): Payer: Self-pay

## 2012-02-14 ENCOUNTER — Inpatient Hospital Stay (HOSPITAL_COMMUNITY)
Admission: EM | Admit: 2012-02-14 | Discharge: 2012-02-26 | DRG: 853 | Disposition: A | Discharge status: Skilled Nursing Facility | Payer: Medicare Other | Attending: Internal Medicine | Admitting: Internal Medicine

## 2012-02-14 DIAGNOSIS — N289 Disorder of kidney and ureter, unspecified: Secondary | ICD-10-CM

## 2012-02-14 DIAGNOSIS — R55 Syncope and collapse: Secondary | ICD-10-CM

## 2012-02-14 DIAGNOSIS — I079 Rheumatic tricuspid valve disease, unspecified: Secondary | ICD-10-CM | POA: Diagnosis present

## 2012-02-14 DIAGNOSIS — D72829 Elevated white blood cell count, unspecified: Secondary | ICD-10-CM

## 2012-02-14 DIAGNOSIS — T424X5A Adverse effect of benzodiazepines, initial encounter: Secondary | ICD-10-CM | POA: Diagnosis not present

## 2012-02-14 DIAGNOSIS — I129 Hypertensive chronic kidney disease with stage 1 through stage 4 chronic kidney disease, or unspecified chronic kidney disease: Secondary | ICD-10-CM | POA: Diagnosis present

## 2012-02-14 DIAGNOSIS — I5031 Acute diastolic (congestive) heart failure: Secondary | ICD-10-CM | POA: Diagnosis present

## 2012-02-14 DIAGNOSIS — E876 Hypokalemia: Secondary | ICD-10-CM | POA: Diagnosis present

## 2012-02-14 DIAGNOSIS — N3289 Other specified disorders of bladder: Secondary | ICD-10-CM

## 2012-02-14 DIAGNOSIS — E1139 Type 2 diabetes mellitus with other diabetic ophthalmic complication: Secondary | ICD-10-CM | POA: Diagnosis present

## 2012-02-14 DIAGNOSIS — I6350 Cerebral infarction due to unspecified occlusion or stenosis of unspecified cerebral artery: Secondary | ICD-10-CM

## 2012-02-14 DIAGNOSIS — J9600 Acute respiratory failure, unspecified whether with hypoxia or hypercapnia: Secondary | ICD-10-CM

## 2012-02-14 DIAGNOSIS — M1990 Unspecified osteoarthritis, unspecified site: Secondary | ICD-10-CM | POA: Diagnosis present

## 2012-02-14 DIAGNOSIS — E669 Obesity, unspecified: Secondary | ICD-10-CM | POA: Diagnosis present

## 2012-02-14 DIAGNOSIS — Z7982 Long term (current) use of aspirin: Secondary | ICD-10-CM

## 2012-02-14 DIAGNOSIS — K224 Dyskinesia of esophagus: Secondary | ICD-10-CM | POA: Diagnosis present

## 2012-02-14 DIAGNOSIS — E1151 Type 2 diabetes mellitus with diabetic peripheral angiopathy without gangrene: Secondary | ICD-10-CM | POA: Diagnosis present

## 2012-02-14 DIAGNOSIS — M170 Bilateral primary osteoarthritis of knee: Secondary | ICD-10-CM

## 2012-02-14 DIAGNOSIS — Z8673 Personal history of transient ischemic attack (TIA), and cerebral infarction without residual deficits: Secondary | ICD-10-CM

## 2012-02-14 DIAGNOSIS — R059 Cough, unspecified: Secondary | ICD-10-CM

## 2012-02-14 DIAGNOSIS — R34 Anuria and oliguria: Secondary | ICD-10-CM

## 2012-02-14 DIAGNOSIS — E11319 Type 2 diabetes mellitus with unspecified diabetic retinopathy without macular edema: Secondary | ICD-10-CM | POA: Diagnosis present

## 2012-02-14 DIAGNOSIS — R0902 Hypoxemia: Secondary | ICD-10-CM

## 2012-02-14 DIAGNOSIS — E1159 Type 2 diabetes mellitus with other circulatory complications: Secondary | ICD-10-CM | POA: Diagnosis present

## 2012-02-14 DIAGNOSIS — I379 Nonrheumatic pulmonary valve disorder, unspecified: Secondary | ICD-10-CM | POA: Diagnosis present

## 2012-02-14 DIAGNOSIS — I509 Heart failure, unspecified: Secondary | ICD-10-CM | POA: Diagnosis present

## 2012-02-14 DIAGNOSIS — K220 Achalasia of cardia: Secondary | ICD-10-CM

## 2012-02-14 DIAGNOSIS — R05 Cough: Secondary | ICD-10-CM

## 2012-02-14 DIAGNOSIS — H547 Unspecified visual loss: Secondary | ICD-10-CM | POA: Diagnosis present

## 2012-02-14 DIAGNOSIS — I080 Rheumatic disorders of both mitral and aortic valves: Secondary | ICD-10-CM | POA: Diagnosis present

## 2012-02-14 DIAGNOSIS — I10 Essential (primary) hypertension: Secondary | ICD-10-CM

## 2012-02-14 DIAGNOSIS — R092 Respiratory arrest: Secondary | ICD-10-CM | POA: Diagnosis not present

## 2012-02-14 DIAGNOSIS — R1310 Dysphagia, unspecified: Secondary | ICD-10-CM | POA: Diagnosis present

## 2012-02-14 DIAGNOSIS — A419 Sepsis, unspecified organism: Secondary | ICD-10-CM

## 2012-02-14 DIAGNOSIS — I96 Gangrene, not elsewhere classified: Secondary | ICD-10-CM

## 2012-02-14 DIAGNOSIS — A4102 Sepsis due to Methicillin resistant Staphylococcus aureus: Principal | ICD-10-CM | POA: Diagnosis present

## 2012-02-14 DIAGNOSIS — Z79899 Other long term (current) drug therapy: Secondary | ICD-10-CM

## 2012-02-14 DIAGNOSIS — N183 Chronic kidney disease, stage 3 unspecified: Secondary | ICD-10-CM | POA: Diagnosis present

## 2012-02-14 DIAGNOSIS — D649 Anemia, unspecified: Secondary | ICD-10-CM

## 2012-02-14 DIAGNOSIS — R0602 Shortness of breath: Secondary | ICD-10-CM

## 2012-02-14 DIAGNOSIS — Z9089 Acquired absence of other organs: Secondary | ICD-10-CM

## 2012-02-14 DIAGNOSIS — J189 Pneumonia, unspecified organism: Secondary | ICD-10-CM

## 2012-02-14 DIAGNOSIS — M869 Osteomyelitis, unspecified: Secondary | ICD-10-CM | POA: Diagnosis present

## 2012-02-14 DIAGNOSIS — Q211 Atrial septal defect: Secondary | ICD-10-CM

## 2012-02-14 DIAGNOSIS — I739 Peripheral vascular disease, unspecified: Secondary | ICD-10-CM | POA: Diagnosis present

## 2012-02-14 DIAGNOSIS — R918 Other nonspecific abnormal finding of lung field: Secondary | ICD-10-CM | POA: Diagnosis present

## 2012-02-14 DIAGNOSIS — Q2111 Secundum atrial septal defect: Secondary | ICD-10-CM

## 2012-02-14 DIAGNOSIS — Z794 Long term (current) use of insulin: Secondary | ICD-10-CM

## 2012-02-14 DIAGNOSIS — R4182 Altered mental status, unspecified: Secondary | ICD-10-CM

## 2012-02-14 DIAGNOSIS — R7881 Bacteremia: Secondary | ICD-10-CM

## 2012-02-14 DIAGNOSIS — E119 Type 2 diabetes mellitus without complications: Secondary | ICD-10-CM

## 2012-02-14 DIAGNOSIS — Z6838 Body mass index (BMI) 38.0-38.9, adult: Secondary | ICD-10-CM

## 2012-02-14 DIAGNOSIS — K219 Gastro-esophageal reflux disease without esophagitis: Secondary | ICD-10-CM | POA: Diagnosis present

## 2012-02-14 DIAGNOSIS — I452 Bifascicular block: Secondary | ICD-10-CM

## 2012-02-14 DIAGNOSIS — H543 Unqualified visual loss, both eyes: Secondary | ICD-10-CM

## 2012-02-14 DIAGNOSIS — N179 Acute kidney failure, unspecified: Secondary | ICD-10-CM

## 2012-02-14 DIAGNOSIS — D638 Anemia in other chronic diseases classified elsewhere: Secondary | ICD-10-CM | POA: Diagnosis present

## 2012-02-14 HISTORY — DX: Constipation, unspecified: K59.00

## 2012-02-14 HISTORY — DX: Disorder of kidney and ureter, unspecified: N28.9

## 2012-02-14 HISTORY — DX: Gastro-esophageal reflux disease without esophagitis: K21.9

## 2012-02-14 LAB — BLOOD GAS, ARTERIAL
Bicarbonate: 22.9 mEq/L (ref 20.0–24.0)
Drawn by: 310571
FIO2: 0.21 %
O2 Saturation: 85.9 %
Patient temperature: 98.6
pH, Arterial: 7.341 — ABNORMAL LOW (ref 7.350–7.450)
pO2, Arterial: 53.9 mmHg — ABNORMAL LOW (ref 80.0–100.0)

## 2012-02-14 LAB — CBC WITH DIFFERENTIAL/PLATELET
Basophils Absolute: 0 10*3/uL (ref 0.0–0.1)
Eosinophils Absolute: 0 10*3/uL (ref 0.0–0.7)
Lymphocytes Relative: 4 % — ABNORMAL LOW (ref 12–46)
MCH: 27.8 pg (ref 26.0–34.0)
MCHC: 32.3 g/dL (ref 30.0–36.0)
Monocytes Absolute: 1.1 10*3/uL — ABNORMAL HIGH (ref 0.1–1.0)
Neutrophils Relative %: 92 % — ABNORMAL HIGH (ref 43–77)
Platelets: 385 10*3/uL (ref 150–400)

## 2012-02-14 LAB — INFLUENZA PANEL BY PCR (TYPE A & B)
H1N1 flu by pcr: NOT DETECTED
Influenza B By PCR: NEGATIVE

## 2012-02-14 LAB — URINALYSIS, ROUTINE W REFLEX MICROSCOPIC
Ketones, ur: NEGATIVE mg/dL
Nitrite: NEGATIVE
pH: 5 (ref 5.0–8.0)

## 2012-02-14 LAB — HEMOGLOBIN A1C: Mean Plasma Glucose: 157 mg/dL — ABNORMAL HIGH (ref <117)

## 2012-02-14 LAB — LACTIC ACID, PLASMA: Lactic Acid, Venous: 1.4 mmol/L (ref 0.5–2.2)

## 2012-02-14 LAB — OCCULT BLOOD X 1 CARD TO LAB, STOOL: Fecal Occult Bld: NEGATIVE

## 2012-02-14 LAB — GLUCOSE, CAPILLARY
Glucose-Capillary: 111 mg/dL — ABNORMAL HIGH (ref 70–99)
Glucose-Capillary: 211 mg/dL — ABNORMAL HIGH (ref 70–99)

## 2012-02-14 LAB — COMPREHENSIVE METABOLIC PANEL
AST: 40 U/L — ABNORMAL HIGH (ref 0–37)
Albumin: 2.4 g/dL — ABNORMAL LOW (ref 3.5–5.2)
Calcium: 9.1 mg/dL (ref 8.4–10.5)
Creatinine, Ser: 1.48 mg/dL — ABNORMAL HIGH (ref 0.50–1.10)
GFR calc non Af Amer: 35 mL/min — ABNORMAL LOW (ref >90)
Sodium: 138 mEq/L (ref 135–145)
Total Protein: 7.8 g/dL (ref 6.0–8.3)

## 2012-02-14 LAB — PRO B NATRIURETIC PEPTIDE: Pro B Natriuretic peptide (BNP): 7013 pg/mL — ABNORMAL HIGH (ref 0–125)

## 2012-02-14 LAB — TROPONIN I: Troponin I: <0.3 ng/mL (ref <0.30)

## 2012-02-14 LAB — PROCALCITONIN: Procalcitonin: 1.65 ng/mL

## 2012-02-14 MED ORDER — VANCOMYCIN HCL IN DEXTROSE 1-5 GM/200ML-% IV SOLN
1000.0000 mg | INTRAVENOUS | Status: DC
  Administered 2012-02-15: 1000 mg via INTRAVENOUS
  Filled 2012-02-14: qty 200

## 2012-02-14 MED ORDER — ONDANSETRON HCL 4 MG/2ML IJ SOLN
4.0000 mg | Freq: Four times a day (QID) | INTRAMUSCULAR | Status: DC | PRN
  Filled 2012-02-14: qty 2

## 2012-02-14 MED ORDER — FUROSEMIDE 20 MG PO TABS
20.0000 mg | ORAL_TABLET | Freq: Two times a day (BID) | ORAL | Status: DC
  Administered 2012-02-14 – 2012-02-16 (×4): 20 mg via ORAL
  Filled 2012-02-14 (×6): qty 1

## 2012-02-14 MED ORDER — INSULIN ASPART 100 UNIT/ML ~~LOC~~ SOLN
0.0000 [IU] | Freq: Three times a day (TID) | SUBCUTANEOUS | Status: DC
  Administered 2012-02-15 – 2012-02-16 (×4): 3 [IU] via SUBCUTANEOUS
  Administered 2012-02-17: 2 [IU] via SUBCUTANEOUS
  Administered 2012-02-18 – 2012-02-20 (×2): 3 [IU] via SUBCUTANEOUS
  Administered 2012-02-20: 2 [IU] via SUBCUTANEOUS
  Administered 2012-02-21: 3 [IU] via SUBCUTANEOUS
  Administered 2012-02-21: 1 [IU] via SUBCUTANEOUS
  Administered 2012-02-22 (×2): 5 [IU] via SUBCUTANEOUS
  Administered 2012-02-22: 3 [IU] via SUBCUTANEOUS
  Administered 2012-02-23 (×2): 5 [IU] via SUBCUTANEOUS
  Administered 2012-02-23 – 2012-02-24 (×2): 3 [IU] via SUBCUTANEOUS
  Administered 2012-02-24: 5 [IU] via SUBCUTANEOUS
  Administered 2012-02-24: 3 [IU] via SUBCUTANEOUS
  Administered 2012-02-25: 2 [IU] via SUBCUTANEOUS
  Administered 2012-02-25 – 2012-02-26 (×2): 3 [IU] via SUBCUTANEOUS
  Administered 2012-02-26: 2 [IU] via SUBCUTANEOUS

## 2012-02-14 MED ORDER — VANCOMYCIN HCL IN DEXTROSE 1-5 GM/200ML-% IV SOLN
1000.0000 mg | Freq: Once | INTRAVENOUS | Status: AC
  Administered 2012-02-14: 1000 mg via INTRAVENOUS
  Filled 2012-02-14: qty 200

## 2012-02-14 MED ORDER — ALBUTEROL SULFATE (5 MG/ML) 0.5% IN NEBU: 2.5000 mg | INHALATION_SOLUTION | RESPIRATORY_TRACT | Status: DC | PRN

## 2012-02-14 MED ORDER — PIPERACILLIN-TAZOBACTAM 3.375 G IVPB
3.3750 g | Freq: Once | INTRAVENOUS | Status: AC
  Administered 2012-02-14: 3.375 g via INTRAVENOUS
  Filled 2012-02-14: qty 50

## 2012-02-14 MED ORDER — CHLORHEXIDINE GLUCONATE 0.12 % MT SOLN
15.0000 mL | Freq: Two times a day (BID) | OROMUCOSAL | Status: DC
  Administered 2012-02-14 – 2012-02-26 (×19): 15 mL via OROMUCOSAL
  Filled 2012-02-14 (×26): qty 15

## 2012-02-14 MED ORDER — BRIMONIDINE TARTRATE 0.2 % OP SOLN
1.0000 [drp] | Freq: Two times a day (BID) | OPHTHALMIC | Status: DC
  Administered 2012-02-14 – 2012-02-26 (×23): 1 [drp] via OPHTHALMIC
  Filled 2012-02-14: qty 5

## 2012-02-14 MED ORDER — ACETAMINOPHEN 325 MG PO TABS
650.0000 mg | ORAL_TABLET | Freq: Four times a day (QID) | ORAL | Status: DC | PRN
  Administered 2012-02-17: 650 mg via ORAL
  Filled 2012-02-14: qty 2

## 2012-02-14 MED ORDER — INSULIN GLARGINE 100 UNIT/ML ~~LOC~~ SOLN
10.0000 [IU] | Freq: Every day | SUBCUTANEOUS | Status: DC
  Administered 2012-02-14 – 2012-02-25 (×12): 10 [IU] via SUBCUTANEOUS

## 2012-02-14 MED ORDER — ADULT MULTIVITAMIN W/MINERALS CH
1.0000 | ORAL_TABLET | Freq: Every day | ORAL | Status: DC
  Administered 2012-02-14 – 2012-02-26 (×12): 1 via ORAL
  Filled 2012-02-14 (×13): qty 1

## 2012-02-14 MED ORDER — PRO-STAT SUGAR FREE PO LIQD
30.0000 mL | Freq: Three times a day (TID) | ORAL | Status: DC
  Administered 2012-02-14 – 2012-02-17 (×8): 30 mL via ORAL
  Filled 2012-02-14 (×14): qty 30

## 2012-02-14 MED ORDER — PIPERACILLIN-TAZOBACTAM 3.375 G IVPB
3.3750 g | Freq: Three times a day (TID) | INTRAVENOUS | Status: DC
  Administered 2012-02-14 – 2012-02-16 (×6): 3.375 g via INTRAVENOUS
  Filled 2012-02-14 (×7): qty 50

## 2012-02-14 MED ORDER — ALBUTEROL SULFATE (5 MG/ML) 0.5% IN NEBU
2.5000 mg | INHALATION_SOLUTION | Freq: Once | RESPIRATORY_TRACT | Status: DC
  Filled 2012-02-14: qty 0.5

## 2012-02-14 MED ORDER — ACETAMINOPHEN 650 MG RE SUPP: 650.0000 mg | Freq: Four times a day (QID) | RECTAL | Status: DC | PRN

## 2012-02-14 MED ORDER — FUROSEMIDE 10 MG/ML IJ SOLN
40.0000 mg | Freq: Once | INTRAMUSCULAR | Status: AC
  Administered 2012-02-14: 40 mg via INTRAVENOUS
  Filled 2012-02-14: qty 4

## 2012-02-14 MED ORDER — GABAPENTIN 100 MG PO CAPS
100.0000 mg | ORAL_CAPSULE | Freq: Every day | ORAL | Status: DC
  Administered 2012-02-14 – 2012-02-25 (×12): 100 mg via ORAL
  Filled 2012-02-14 (×13): qty 1

## 2012-02-14 MED ORDER — SODIUM CHLORIDE 0.9 % IJ SOLN
3.0000 mL | INTRAMUSCULAR | Status: DC | PRN
  Administered 2012-02-25: 3 mL via INTRAVENOUS

## 2012-02-14 MED ORDER — ONDANSETRON HCL 4 MG PO TABS
4.0000 mg | ORAL_TABLET | Freq: Four times a day (QID) | ORAL | Status: DC | PRN
  Administered 2012-02-18: 4 mg via ORAL
  Filled 2012-02-14: qty 1

## 2012-02-14 MED ORDER — SALINE SPRAY 0.65 % NA SOLN: 1.0000 | Freq: Four times a day (QID) | NASAL | Status: DC | PRN

## 2012-02-14 MED ORDER — SODIUM CHLORIDE 0.9 % IJ SOLN
3.0000 mL | Freq: Two times a day (BID) | INTRAMUSCULAR | Status: DC
  Administered 2012-02-14: 10 mL via INTRAVENOUS
  Administered 2012-02-15 – 2012-02-26 (×7): 3 mL via INTRAVENOUS

## 2012-02-14 MED ORDER — BRINZOLAMIDE-BRIMONIDINE 1-0.2 % OP SUSP: 1.0000 [drp] | Freq: Two times a day (BID) | OPHTHALMIC | Status: DC

## 2012-02-14 MED ORDER — ASPIRIN EC 325 MG PO TBEC
325.0000 mg | DELAYED_RELEASE_TABLET | Freq: Every day | ORAL | Status: DC
  Administered 2012-02-14 – 2012-02-26 (×11): 325 mg via ORAL
  Filled 2012-02-14 (×16): qty 1

## 2012-02-14 MED ORDER — ALBUTEROL SULFATE (5 MG/ML) 0.5% IN NEBU
5.0000 mg | INHALATION_SOLUTION | Freq: Once | RESPIRATORY_TRACT | Status: AC
  Administered 2012-02-14: 5 mg via RESPIRATORY_TRACT
  Filled 2012-02-14: qty 1

## 2012-02-14 MED ORDER — BRINZOLAMIDE 1 % OP SUSP
1.0000 [drp] | Freq: Two times a day (BID) | OPHTHALMIC | Status: DC
  Administered 2012-02-14 – 2012-02-26 (×23): 1 [drp] via OPHTHALMIC
  Filled 2012-02-14: qty 10

## 2012-02-14 MED ORDER — BIOTENE DRY MOUTH MT LIQD
15.0000 mL | Freq: Four times a day (QID) | OROMUCOSAL | Status: DC
  Administered 2012-02-14 – 2012-02-15 (×4): 15 mL via OROMUCOSAL

## 2012-02-14 MED ORDER — IPRATROPIUM BROMIDE 0.02 % IN SOLN
0.5000 mg | Freq: Four times a day (QID) | RESPIRATORY_TRACT | Status: DC
  Administered 2012-02-14 – 2012-02-16 (×10): 0.5 mg via RESPIRATORY_TRACT
  Filled 2012-02-14 (×10): qty 2.5

## 2012-02-14 MED ORDER — INSULIN ASPART 100 UNIT/ML ~~LOC~~ SOLN
0.0000 [IU] | Freq: Every day | SUBCUTANEOUS | Status: DC
  Administered 2012-02-14 – 2012-02-21 (×2): 2 [IU] via SUBCUTANEOUS
  Administered 2012-02-22: 3 [IU] via SUBCUTANEOUS

## 2012-02-14 MED ORDER — ALBUTEROL SULFATE (5 MG/ML) 0.5% IN NEBU
2.5000 mg | INHALATION_SOLUTION | Freq: Four times a day (QID) | RESPIRATORY_TRACT | Status: DC
  Administered 2012-02-14 – 2012-02-16 (×10): 2.5 mg via RESPIRATORY_TRACT
  Filled 2012-02-14 (×10): qty 0.5

## 2012-02-14 MED ORDER — DOCUSATE SODIUM 100 MG PO CAPS
100.0000 mg | ORAL_CAPSULE | Freq: Two times a day (BID) | ORAL | Status: DC
  Administered 2012-02-14 – 2012-02-26 (×21): 100 mg via ORAL
  Filled 2012-02-14 (×25): qty 1

## 2012-02-14 MED ORDER — SODIUM CHLORIDE 0.9 % IJ SOLN
3.0000 mL | Freq: Two times a day (BID) | INTRAMUSCULAR | Status: DC
  Administered 2012-02-15 – 2012-02-26 (×11): 3 mL via INTRAVENOUS

## 2012-02-14 MED ORDER — SODIUM CHLORIDE 0.9 % IV SOLN
250.0000 mL | INTRAVENOUS | Status: DC | PRN
  Administered 2012-02-18: 10:00:00 via INTRAVENOUS
  Administered 2012-02-22: 250 mL via INTRAVENOUS

## 2012-02-14 MED ORDER — CARVEDILOL 3.125 MG PO TABS
3.1250 mg | ORAL_TABLET | Freq: Every day | ORAL | Status: DC
  Administered 2012-02-15 – 2012-02-26 (×12): 3.125 mg via ORAL
  Filled 2012-02-14 (×15): qty 1

## 2012-02-14 MED ORDER — METOCLOPRAMIDE HCL 5 MG PO TABS
5.0000 mg | ORAL_TABLET | Freq: Three times a day (TID) | ORAL | Status: DC
  Administered 2012-02-14 – 2012-02-26 (×31): 5 mg via ORAL
  Filled 2012-02-14 (×38): qty 1

## 2012-02-14 NOTE — ED Provider Notes (Signed)
History     CSN: [id number]  Arrival date & time 02/14/12  4098   First MD Initiated Contact with Patient 02/14/12 0957      Chief Complaint  Patient presents with  . Shortness of Breath     The history is provided by the EMS personnel. History Limited By: level V caveat: AMS.   patient was noted to be hypoxic at the nursing home with O2 sats reported around 78%.  She was placed on antibiotics and since the emergency department for evaluation.  Chest x-ray obtained yesterday demonstrates no evidence of pneumonia.  Flu test was obtained there and is pending as well.  The patient reports cough but is unable to provide any significant additional history secondary to lethargy.  She is not on home oxygen.  She has a history of hypertension and diabetes as well as TIA and anemia.  She is legally blind.  She does deny chest pain.  Past Medical History  Diagnosis Date  . Hypertension   . Diabetes mellitus   . Blindness   . TIA (transient ischemic attack)   . Anemia   . GERD (gastroesophageal reflux disease)   . Renal disorder   . Constipation     Past Surgical History  Procedure Date  . Gallbladder surgery   . Esophageal dilation     History reviewed. No pertinent family history.  History  Substance Use Topics  . Smoking status: Never Smoker   . Smokeless tobacco: Current User    Types: Snuff  . Alcohol Use: No    OB History    Grav Para Term Preterm Abortions TAB SAB Ect Mult Living                  Review of Systems  Unable to perform ROS: Mental status change  Respiratory: Positive for shortness of breath.     Allergies  Review of patient's allergies indicates no known allergies.  Home Medications   Current Outpatient Rx  Name  Route  Sig  Dispense  Refill  . ASPIRIN EC 325 MG PO TBEC   Oral   Take 325 mg by mouth daily.           . INSULIN GLARGINE 100 UNIT/ML [place] SOLN   Subcutaneous   Inject 10 Units into the skin at bedtime.         .  MULTI-VITAMIN/MINERALS PO TABS   Oral   Take 1 tablet by mouth daily.         . ACETAMINOPHEN 325 MG PO TABS   Oral   Take 2 tablets (650 mg total) by mouth every 6 (six) hours as needed (or Fever >/= 101).         . ALBUTEROL SULFATE (5 MG/ML) 0.5% IN NEBU   Nebulization   Take 0.5 mLs (2.5 mg total) by nebulization every 4 (four) hours as needed for wheezing.   20 mL      . CARVEDILOL 3.125 MG PO TABS   Oral   Take 1 tablet (3.125 mg total) by mouth daily with breakfast.         . INSULIN ASPART 100 UNIT/ML [place] SOLN   Subcutaneous   Inject 0-9 Units into the skin 3 (three) times daily with meals. CBG < 70: implement hypoglycemia protocol CBG 70 - 120: 0 units CBG 121 - 150: 1 unit CBG 151 - 200: 2 units CBG 201 - 250: 3 units CBG 251 - 300: 5 units CBG 301 -  350: 7 units CBG 351 - 400: 9 units CBG > 400 call MD.   1 vial      . IPRATROPIUM BROMIDE 0.02 % IN SOLN   Nebulization   Take 2.5 mLs (0.5 mg total) by nebulization every 4 (four) hours as needed.   75 mL      . METOCLOPRAMIDE HCL 5 MG PO TABS   Oral   Take 1 tablet (5 mg total) by mouth 3 (three) times daily before meals.         [place] Kitchen SALINE 0.65 % NA SOLN   Nasal   Place 1 spray into the nose 4 (four) times daily as needed for congestion.           BP 133/48  Pulse 64  Temp 98.1 F (36.7 C) (Oral)  Resp 15  SpO2 91%  Physical Exam  Nursing note and vitals reviewed. Constitutional: She is oriented to person, place, and time. She appears well-developed and well-nourished. No distress.  HENT:  Head: Normocephalic and atraumatic.  Eyes: EOM are normal.  Neck: Normal range of motion.  Cardiovascular: Normal rate, regular rhythm and normal heart sounds.   Pulmonary/Chest: Effort normal and breath sounds normal.  Abdominal: Soft. She exhibits no distension. There is no tenderness.  Musculoskeletal: Normal range of motion.       Her bilateral feet are wrapped in [place] boots secondary to what  appears to be bilateral chronic heel ulcerations.  Her ulcerations of her bilateral heels have no secondary signs of infection  Neurological: She is oriented to person, place, and time.       Opens eyes to voice.  Follows very simple commands.  Bilateral grip strength is equal.  Wiggles her toes.  Skin: Skin is warm and dry.  Psychiatric: She has a normal mood and affect. Judgment normal.    ED Course  Procedures (including critical care time)  Labs Reviewed  BLOOD GAS, ARTERIAL - Abnormal; Notable for the following:    pH, Arterial 7.341 (*)     pO2, Arterial 53.9 (*)     Acid-base deficit 2.2 (*)     All other components within normal limits  CBC WITH DIFFERENTIAL - Abnormal; Notable for the following:    WBC 26.9 (*)  WHITE COUNT CONFIRMED ON SMEAR   RBC 3.74 (*)     Hemoglobin 10.4 (*)     HCT 32.2 (*)     Neutrophils Relative 92 (*)     Lymphocytes Relative 4 (*)     Neutro Abs 24.7 (*)     Monocytes Absolute 1.1 (*)     All other components within normal limits  COMPREHENSIVE METABOLIC PANEL - Abnormal; Notable for the following:    BUN 71 (*)     Creatinine, Ser 1.48 (*)     Albumin 2.4 (*)     AST 40 (*)     Alkaline Phosphatase 174 (*)     GFR calc non Af Amer 35 (*)     GFR calc Af Amer 41 (*)     All other components within normal limits  URINALYSIS, ROUTINE W REFLEX MICROSCOPIC - Abnormal; Notable for the following:    Color, Urine AMBER (*)  BIOCHEMICALS MAY BE AFFECTED BY COLOR   APPearance CLOUDY (*)     Bilirubin Urine SMALL (*)     Leukocytes, UA TRACE (*)     All other components within normal limits  PRO B NATRIURETIC PEPTIDE - Abnormal; Notable for the following:  Pro B Natriuretic peptide (BNP) 7013.0 (*)     All other components within normal limits  URINE MICROSCOPIC-ADD ON - Abnormal; Notable for the following:    Squamous Epithelial / LPF FEW (*)     Bacteria, UA FEW (*)     All other components within normal limits  TSH - Abnormal; Notable  for the following:    TSH 0.295 (*)     All other components within normal limits  HEMOGLOBIN A1C - Abnormal; Notable for the following:    Hemoglobin A1C 7.1 (*)     Mean Plasma Glucose 157 (*)     All other components within normal limits  BASIC METABOLIC PANEL - Abnormal; Notable for the following:    Potassium 3.4 (*)     Glucose, Bld 160 (*)     BUN 72 (*)     Creatinine, Ser 1.48 (*)     GFR calc non Af Amer 35 (*)     GFR calc Af Amer 41 (*)     All other components within normal limits  CBC - Abnormal; Notable for the following:    WBC 21.2 (*)     RBC 3.71 (*)     Hemoglobin 10.3 (*)     HCT 31.1 (*)     All other components within normal limits  GLUCOSE, CAPILLARY - Abnormal; Notable for the following:    Glucose-Capillary 111 (*)     All other components within normal limits  GLUCOSE, CAPILLARY - Abnormal; Notable for the following:    Glucose-Capillary 211 (*)     All other components within normal limits  BLOOD GAS, ARTERIAL - Abnormal; Notable for the following:    pO2, Arterial 76.4 (*)     Bicarbonate 25.0 (*)     All other components within normal limits  GLUCOSE, CAPILLARY - Abnormal; Notable for the following:    Glucose-Capillary 113 (*)     All other components within normal limits  AMMONIA  LACTIC ACID, PLASMA  TROPONIN I  INFLUENZA PANEL BY PCR  OCCULT BLOOD X 1 CARD TO LAB, STOOL  MRSA PCR SCREENING  PROCALCITONIN  CULTURE, BLOOD (ROUTINE X 2)  CULTURE, BLOOD (ROUTINE X 2)  URINE CULTURE   Ct Head Wo Contrast  02/14/2012  *RADIOLOGY REPORT*  Clinical Data: Altered mental status.  History of weakness and TIA.  CT HEAD WITHOUT CONTRAST  Technique:  Contiguous axial images were obtained from the base of the skull through the vertex without contrast.  Comparison: 12/25/2008  Findings: Bone windows demonstrate similar abnormal appearance of the right globe, presumably related to prior trauma or hemorrhage. There are also similar calcifications about  the left globe.  Mucosal thickening of the sphenoid sinus and ethmoid air cells.  A left mastoid effusion is again identified.  Soft tissue windows demonstrate similar moderate hydrocephalus, including the fourth, third, and lateral ventricles.  Slight increase in periventricular white matter hypoattenuation.  Redemonstration of right frontal hypoattenuation which is felt to be similar, including on image 20/series 2.  No mass lesion, hemorrhage, intra-axial, or extra-axial fluid collection.  IMPRESSION:  1.  Similar hydrocephalus. 2.  Slight increase in periventricular white matter hypoattenuation.  Favor small vessel ischemic change.  A component of transependymal CSF resorption could look similar. 3.  Right frontal lobe remote infarct. 4.  Sinus disease with a left mastoid effusion.   Original Report Authenticated By: Jeronimo Greaves, M.D.    Portable Chest 1 View  02/15/2012  *RADIOLOGY REPORT*  Clinical Data: CHF.  PORTABLE CHEST - 1 VIEW  Comparison: 02/14/2012  Findings: Shallow inspiration.  Cardiac enlargement with pulmonary vascular congestion and perihilar infiltration suggesting edema or pneumonia.  The infiltration is slightly more prominent than previous study.  No pneumothorax or effusion.  IMPRESSION: Cardiac enlargement with pulmonary vascular congestion.  Increasing perihilar infiltrates or edema bilaterally.   Original Report Authenticated By: Burman Nieves, M.D.    Dg Chest Portable 1 View  02/14/2012  *RADIOLOGY REPORT*  Clinical Data: Shortness of breath and diabetes.  Hypertension.  PORTABLE CHEST - 1 VIEW  Comparison: Two-view chest x-ray and CT of the chest 06/22/2011.  Findings: The heart is mildly enlarged.  Lung volumes are low. Mild interstitial and airspace disease is present bilaterally. The bone windows are unremarkable.  IMPRESSION:  1.  Mild cardiomegaly and edema is concerning for congestive heart failure. 2.  There is some airspace disease bilaterally which raises the  possibility of infection as well.   Original Report Authenticated By: Marin Roberts, M.D.      1. HCAP (healthcare-associated pneumonia)   2. Hypoxia   3. Altered mental status   4. Anemia   5. Renal insufficiency   6. Acute respiratory failure   7. Cough   8. Leukocytosis       MDM  The patient be admitted for what appears to be healthcare associated pneumonia.  Antibiotics initiated in the emergency department.        Lyanne Co, MD 02/15/12 [phone]

## 2012-02-14 NOTE — Progress Notes (Signed)
ANTIBIOTIC CONSULT NOTE - INITIAL  Pharmacy Consult for  Indication: pneumonia  No Known Allergies  Patient Measurements: Height: 5\' 2"  (157.5 cm) IBW/kg (Calculated) : 50.1  85.2kg per RN Adjusted wt 64kg  Vital Signs: Temp: 98.1 F (36.7 C) (01/16 1123) Temp src: Rectal (01/16 1123) BP: 141/61 mmHg (01/16 1500) Pulse Rate: 67  (01/16 1500) Intake/Output from previous day:   Intake/Output from this shift:    Labs:  Basename 02/14/12 1045  WBC 26.9*  HGB 10.4*  PLT 385  LABCREA --  CREATININE 1.48*   The CrCl is unknown because both a height and weight (above a minimum accepted value) are required for this calculation. No results found for this basename: VANCOTROUGH:2,VANCOPEAK:2,VANCORANDOM:2,GENTTROUGH:2,GENTPEAK:2,GENTRANDOM:2,TOBRATROUGH:2,TOBRAPEAK:2,TOBRARND:2,AMIKACINPEAK:2,AMIKACINTROU:2,AMIKACIN:2, in the last 72 hours   Microbiology: No results found for this or any previous visit (from the past 720 hour(s)).  Medical History: Past Medical History  Diagnosis Date  . Hypertension   . Diabetes mellitus   . Blindness   . TIA (transient ischemic attack)   . Anemia   . GERD (gastroesophageal reflux disease)   . Renal disorder   . Constipation     Medications:  Anti-infectives     Start     Dose/Rate Route Frequency Ordered Stop   02/14/12 1145   vancomycin (VANCOCIN) IVPB 1000 mg/200 mL premix        1,000 mg 200 mL/hr over 60 Minutes Intravenous  Once 02/14/12 1143 02/14/12 1352   02/14/12 1145   piperacillin-tazobactam (ZOSYN) IVPB 3.375 g        3.375 g 100 mL/hr over 30 Minutes Intravenous  Once 02/14/12 1143 02/14/12 1244         Assessment:  71 YOF admitted 1/16 from SNF w/ hypoxia. Suspected PNA (HCAP). Pharmacy asked to dose Vancomycin and Zosyn.  Vanc 1g and Zosyn 3.375g IV x 1 each in ER today @ 1300 and 1200 respectively  Scr 1.48, CrCl ~ 36 ml/min (31ml/min/1.73m2)  Elevated WBC, 26.9, Afebrile  Was on azithromycin PO and  rocephin IM @ facility (started 1/15)  Goal of Therapy:  Vancomycin trough level 15-20 mcg/ml  Plan:   Vancomycin 1 g IV q24h  Zosyn 3.375g IV q8h, each dose over 4 hours  Follow labs, vitals, cultures.  Vancomycin trough at steady state  Adjust doses as necessary  Gwen Her PharmD  [phone] 02/14/2012 4:44 PM

## 2012-02-14 NOTE — Evaluation (Signed)
Clinical/Bedside Swallow Evaluation Patient Details  Name: [name] MRN: [id number] Date of Birth: July 29, 1940  Today's Date: 02/14/2012 Time: 1191-4782 SLP Time Calculation (min): 26 min  Past Medical History:  Past Medical History  Diagnosis Date  . Hypertension   . Diabetes mellitus   . Blindness   . TIA (transient ischemic attack)   . Anemia   . GERD (gastroesophageal reflux disease)   . Renal disorder   . Constipation    Past Surgical History:  Past Surgical History  Procedure Date  . Gallbladder surgery   . Esophageal dilation   . Cholecystectomy    HPI:  72 yo female adm to [facility] from [facility] SNF with hypoxia, CXR + fluid, +/- pna- diagnosed with HCAP, ? CHF (await BNP).  Per MD notes, pt told MD that she "always has pna."   PMH + for TIA, GERD, anemia, constipation, HTN, blindness/HOH. SLP phoned SNF SLP who reports she has not seen pt, she has however seen the pt's roommate during meals and has not noted pt coughing during meals.  Per SNF SLP, pt was recently "flagged for weight loss" .  Pt is agitated today and does not follow directions but would swallow water.  Esophageal dilatation completed in 03/1999 and pt states she had problems swallowing before surgery that resolved after.   Note pt with remote right frontal infarct, sinus disease, hydrocephalus per CT 1/16 and CXR 02/14/11 edema and mild cardiomegaly concerning for CHF and airspace disease possibly infection.      Ct Head Wo Contrast  02/14/2012  *RADIOLOGY REPORT*  Clinical Data: Altered mental status.  History of weakness and TIA.  CT HEAD WITHOUT CONTRAST  Technique:  Contiguous axial images were obtained from the base of the skull through the vertex without contrast.  Comparison: 12/25/2008  Findings: Bone windows demonstrate similar abnormal appearance of the right globe, presumably related to prior trauma or hemorrhage. There are also similar calcifications about the left globe.  Mucosal  thickening of the sphenoid sinus and ethmoid air cells.  A left mastoid effusion is again identified.  Soft tissue windows demonstrate similar moderate hydrocephalus, including the fourth, third, and lateral ventricles.  Slight increase in periventricular white matter hypoattenuation.  Redemonstration of right frontal hypoattenuation which is felt to be similar, including on image 20/series 2.  No mass lesion, hemorrhage, intra-axial, or extra-axial fluid collection.  IMPRESSION:  1.  Similar hydrocephalus. 2.  Slight increase in periventricular white matter hypoattenuation.  Favor small vessel ischemic change.  A component of transependymal CSF resorption could look similar. 3.  Right frontal lobe remote infarct. 4.  Sinus disease with a left mastoid effusion.   Original Report Authenticated By: Jeronimo Greaves, M.D.    Dg Chest Portable 1 View  02/14/2012  *RADIOLOGY REPORT*  Clinical Data: Shortness of breath and diabetes.  Hypertension.  PORTABLE CHEST - 1 VIEW  Comparison: Two-view chest x-ray and CT of the chest 06/22/2011.  Findings: The heart is mildly enlarged.  Lung volumes are low. Mild interstitial and airspace disease is present bilaterally. The bone windows are unremarkable.  IMPRESSION:  1.  Mild cardiomegaly and edema is concerning for congestive heart failure. 2.  There is some airspace disease bilaterally which raises the possibility of infection as well.   Original Report Authenticated By: Marin Roberts, M.D.      Assessment / Plan / Recommendation Clinical Impression  Limited evaluation completed due to decreased participation by pt.  Pt repeatedly stated she would not  answer SLP questions nor follow directions for oral motor exam.  She did however accept po intake including water - approx 6 ounces, applesauce (taking small bites from spoon placed to mouth by slp) and graham crackers x3.  Pt takes large sequential boluses due to impulsivity and eats at rapid rate.  Cough present with  and without po intake but swallow appeared timely without multiple swallows that may indicate residuals     Pt does have known h/o esophageal dilatation-03/1999 but she denies reflux/esoph issue currently, stating the surgery fixed her problem. Pt states intake poor at SNF due to not liking food offered.    Based on report by SNF SLP and pt report, do not suspect pt is overtly aspirating.  SLP at SNF states she has not observed pt coughing with po when in pt's shared room seeing another pt, but she has not eval'd pt.    However will follow up briefly for diet tolerance, necessity for instrumental assessment and to establish baseline swallow ability due to pt not answering questions today.       Rec reflux/esophageal precautions due to pt's EGD hx and Reglan use.     Aspiration Risk    Moderate   Diet Recommendation Regular;Thin liquid   Liquid Administration via: Straw;Cup Medication Administration: Other (Comment) (as tolerated) Supervision: Full supervision/cueing for compensatory strategies Compensations: Slow rate;Small sips/bites;Check for pocketing Postural Changes and/or Swallow Maneuvers: Upright 30-60 min after meal;Seated upright 90 degrees    Other  Recommendations Oral Care Recommendations: Oral care BID   Follow Up Recommendations    TBD   Frequency and Duration min 2x/week  2 weeks   Pertinent Vitals/Pain Afebrile, congested cough    SLP Swallow Goals Patient will utilize recommended strategies during swallow to increase swallowing safety with: Total assistance   Swallow Study Prior Functional Status   Pt states she feeds self prior to admission but dislikes the "slop" they give her at the facility.      General Date of Onset: 02/14/12 HPI: 72 yo female adm to [facility] from [facility] SNF with hypoxia, CXR + fluid, +/- pna- diagnosed with HCAP, ? CHF (await BNP).  Per MD notes, pt told MD that she "always has pna."   PMH + for TIA, GERD, anemia,  constipation, HTN, blindness/HOH. SLP phoned SNF SLP who reports she has not seen pt, she has however seen the pt's roommate during meals and has not noted pt coughing during meals.  Per SNF SLP, pt was recently "flagged for weight loss" .  Pt is agitated today and does not follow directions but would swallow water.  Esophageal dilatation completed in 03/1999 and pt states she had problems swallowing before surgery that resolved after.    Type of Study: Bedside swallow evaluation Diet Prior to this Study: Regular;Thin liquids Temperature Spikes Noted: No Respiratory Status: Supplemental O2 delivered via (comment) (4 liters) History of Recent Intubation: No Behavior/Cognition: Alert;Agitated;Impulsive;Uncooperative;Doesn't follow directions;Decreased sustained attention;Hard of hearing Oral Cavity - Dentition:  (dentition present but no able to fully view to see condition) Self-Feeding Abilities: Needs assist Patient Positioning: Upright in bed Baseline Vocal Quality: Clear Volitional Cough: Congested;Strong Volitional Swallow: Unable to elicit (pt did not follow directions to dry swallow)    Oral/Motor/Sensory Function Overall Oral Motor/Sensory Function: Appears within functional limits for tasks assessed (did not follow commands, able to seal lips on straw, spoon) Labial Symmetry: Within Functional Limits Facial ROM: Within Functional Limits   Ice Chips Ice chips: Not tested  Thin Liquid Presentation: Straw Other Comments: pt takes large sequential boluses-impulsive.  cough with and without po intake- swallow appeared timely and did not observe multiple swallows to indicate residuals    Nectar Thick Nectar Thick Liquid: Not tested   Honey Thick Honey Thick Liquid: Not tested   Puree Puree: Within functional limits Presentation: Spoon   Solid   GO    Solid: Impaired Presentation: Self Fed Oral Phase Impairments:  (rapid rate of mastication and swallowing) Oral Phase Functional  Implications: Oral residue (mild amount) Other Comments: pt takes large bites (places entire cracker in oral cavity)- does not follow directions to take small bites- slp provided smaller sized pieces      Donavan Burnet, MS [facility] SLP [phone]

## 2012-02-14 NOTE — H&P (Addendum)
[facility] History and Physical  Pernell Dikes HQI:[id number] DOB: 1940-07-06 DOA: 02/14/2012  Referring physician: er PCP: Dow Adolph, MD  Specialists:   Chief Complaint:   HPI: Ariella Voit is a 72 y.o. female  Was sent from her [facility], [facility], with hypoxia.  Her O2 sats on room air were 78%.  She was sent to the ER for further evaluation but before she left they check a flu panel which was not back.  She also had a chest x ray done which showed no sign of PNA.    Not on O2 at home  In the ER patient complained of cough.  She also said she always has PNA.  She had a very wet sounding cough in the ER.  Denies swelling in leg, denies fever/chills. Hgb was found to be low, WBC elevated and chest x  Ray + for fluid +/- PNA   Review of Systems: all systems reviewed, negative unless stated above  Past Medical History  Diagnosis Date  . Hypertension   . Diabetes mellitus   . Blindness   . TIA (transient ischemic attack)   . Anemia   . GERD (gastroesophageal reflux disease)   . Renal disorder   . Constipation    Past Surgical History  Procedure Date  . Gallbladder surgery   . Esophageal dilation   . Cholecystectomy    Social History:  reports that she has never smoked. Her smokeless tobacco use includes Snuff. She reports that she does not drink alcohol or use illicit drugs. From [facility]  No Known Allergies  Family Hx: + HTN  Prior to Admission medications   Medication Sig Start Date End Date Taking? Authorizing Provider  acetaminophen (TYLENOL) 325 MG tablet Take 2 tablets (650 mg total) by mouth every 6 (six) hours as needed (or Fever >/= 101). 06/29/11 06/28/12 Yes Srikar Cherlynn Kaiser, MD  albuterol (PROVENTIL) (5 MG/ML) 0.5% nebulizer solution Take 0.5 mLs (2.5 mg total) by nebulization every 4 (four) hours as needed for wheezing. 06/29/11 06/28/12 Yes Srikar Cherlynn Kaiser, MD  aspirin EC 325 MG tablet Take 325 mg by mouth daily.     Yes Historical Provider, MD    azithromycin (ZITHROMAX Z-PAK) 250 MG tablet Take 250-500 mg by mouth daily. 500 mg on first dose. 250 mg every day for 4 days, started on 02-13-12   Yes Historical Provider, MD  Brinzolamide-Brimonidine [facility]) 1-0.2 % SUSP Place 1 drop into the left eye 2 (two) times daily.   Yes Historical Provider, MD  carvedilol (COREG) 3.125 MG tablet Take 1 tablet (3.125 mg total) by mouth daily with breakfast. 06/29/11 06/28/12 Yes Srikar Cherlynn Kaiser, MD  cefTRIAXone (ROCEPHIN) 1 G injection Inject 1 g into the muscle daily. Pt's therapy is for 7 days. Started 02-13-12   Yes Historical Provider, MD  docusate sodium (COLACE) 100 MG capsule Take 100 mg by mouth 2 (two) times daily.   Yes Historical Provider, MD  feeding supplement (PRO-STAT SUGAR FREE 64) LIQD Take 30 mLs by mouth 3 (three) times daily with meals.   Yes Historical Provider, MD  furosemide (LASIX) 20 MG tablet Take 20 mg by mouth 2 (two) times daily.   Yes Historical Provider, MD  gabapentin (NEURONTIN) 100 MG capsule Take 100 mg by mouth at bedtime.   Yes Historical Provider, MD  HYDROcodone-acetaminophen (NORCO/VICODIN) 5-325 MG per tablet Take 1 tablet by mouth 3 (three) times daily as needed. pain   Yes Historical Provider, MD  insulin aspart (NOVOLOG) 100 UNIT/ML  injection Inject 0-9 Units into the skin 3 (three) times daily with meals. CBG < 70: implement hypoglycemia protocol CBG 70 - 120: 0 units CBG 121 - 150: 1 unit CBG 151 - 200: 2 units CBG 201 - 250: 3 units CBG 251 - 300: 5 units CBG 301 - 350: 7 units CBG 351 - 400: 9 units CBG > 400 call MD. 06/29/11 06/28/12 Yes Srikar Cherlynn Kaiser, MD  insulin glargine (LANTUS) 100 UNIT/ML injection Inject 10 Units into the skin at bedtime.   Yes Historical Provider, MD  ipratropium (ATROVENT) 0.02 % nebulizer solution Take 2.5 mLs (0.5 mg total) by nebulization every 4 (four) hours as needed. 06/29/11 06/28/12 Yes Srikar Cherlynn Kaiser, MD  ipratropium-albuterol (DUONEB) 0.5-2.5 (3) MG/3ML SOLN Take 3 mLs  by nebulization every 6 (six) hours as needed. wheezing   Yes Historical Provider, MD  metoCLOPramide (REGLAN) 5 MG tablet Take 5 mg by mouth 3 (three) times daily.   Yes Historical Provider, MD  Multiple Vitamins-Minerals (MULTIVITAMIN WITH MINERALS) tablet Take 1 tablet by mouth daily.   Yes Historical Provider, MD  naproxen sodium (ALEVE) 220 MG tablet Take 220 mg by mouth 2 (two) times daily with a meal.   Yes Historical Provider, MD  sodium chloride (OCEAN) 0.65 % SOLN nasal spray Place 1 spray into the nose 4 (four) times daily as needed for congestion. 06/29/11  Yes Cristal Ford, MD   Physical Exam: Filed Vitals:   02/14/12 1100 02/14/12 1123 02/14/12 1214 02/14/12 1230  BP:   132/74 147/72  Pulse: 64  68 60  Temp:  98.1 F (36.7 C)    TempSrc:  Rectal    Resp: 15  13 17   SpO2: 93%  100% 97%     General:  Sleepy but will awake and speak- not orient to time and conversation does not always make sense- ? baseline  Eyes: wnl  ENT: wnl  Neck: supple  Cardiovascular: rrr  Respiratory: wet upper airway sounds, no wheezing ascultated  Abdomen: obese, +BS, soft, NT/ND  Skin: no rashes or lesions  Musculoskeletal: weak- will wiggle toes, grip equal- legs have B/L boots on  Psychiatric: slow to respond  Neurologic: unable to do full Neuro but no focal weakness- would not open eyes  Labs on Admission:  Basic Metabolic Panel:  Lab 02/14/12 4098  NA 138  K 4.6  CL 97  CO2 22  GLUCOSE 96  BUN 71*  CREATININE 1.48*  CALCIUM 9.1  MG --  PHOS --   Liver Function Tests:  Lab 02/14/12 1045  AST 40*  ALT 24  ALKPHOS 174*  BILITOT 0.8  PROT 7.8  ALBUMIN 2.4*   No results found for this basename: LIPASE:5,AMYLASE:5 in the last 168 hours  Lab 02/14/12 1045  AMMONIA 32   CBC:  Lab 02/14/12 1045  WBC 26.9*  NEUTROABS 24.7*  HGB 10.4*  HCT 32.2*  MCV 86.1  PLT 385   Cardiac Enzymes:  Lab 02/14/12 1045  CKTOTAL --  CKMB --  CKMBINDEX --  TROPONINI  <0.30    BNP (last 3 results)  Basename 02/14/12 1045 06/23/11 0900  PROBNP 7013.0* 1570.0*   CBG: No results found for this basename: GLUCAP:5 in the last 168 hours  Radiological Exams on Admission: Ct Head Wo Contrast  02/14/2012  *RADIOLOGY REPORT*  Clinical Data: Altered mental status.  History of weakness and TIA.  CT HEAD WITHOUT CONTRAST  Technique:  Contiguous axial images were obtained from the base of  the skull through the vertex without contrast.  Comparison: 12/25/2008  Findings: Bone windows demonstrate similar abnormal appearance of the right globe, presumably related to prior trauma or hemorrhage. There are also similar calcifications about the left globe.  Mucosal thickening of the sphenoid sinus and ethmoid air cells.  A left mastoid effusion is again identified.  Soft tissue windows demonstrate similar moderate hydrocephalus, including the fourth, third, and lateral ventricles.  Slight increase in periventricular white matter hypoattenuation.  Redemonstration of right frontal hypoattenuation which is felt to be similar, including on image 20/series 2.  No mass lesion, hemorrhage, intra-axial, or extra-axial fluid collection.  IMPRESSION:  1.  Similar hydrocephalus. 2.  Slight increase in periventricular white matter hypoattenuation.  Favor small vessel ischemic change.  A component of transependymal CSF resorption could look similar. 3.  Right frontal lobe remote infarct. 4.  Sinus disease with a left mastoid effusion.   Original Report Authenticated By: Jeronimo Greaves, M.D.    Dg Chest Portable 1 View  02/14/2012  *RADIOLOGY REPORT*  Clinical Data: Shortness of breath and diabetes.  Hypertension.  PORTABLE CHEST - 1 VIEW  Comparison: Two-view chest x-ray and CT of the chest 06/22/2011.  Findings: The heart is mildly enlarged.  Lung volumes are low. Mild interstitial and airspace disease is present bilaterally. The bone windows are unremarkable.  IMPRESSION:  1.  Mild cardiomegaly and  edema is concerning for congestive heart failure. 2.  There is some airspace disease bilaterally which raises the possibility of infection as well.   Original Report Authenticated By: Marin Roberts, M.D.       Assessment/Plan Principal Problem:  *HCAP (healthcare-associated pneumonia) Active Problems:  DM  BLINDNESS, BILATERAL  ESSENTIAL HYPERTENSION  SOB (shortness of breath)  Cough  Acute respiratory failure  Leukocytosis   1. Place in the SDU for closer monitoring, HCAPNA: will give nebs, abx and supportive care with O2- await BNP to see if component of CHF as well- h/o diastolic- hold IVF, IV lasix if BNP elevated, check procalcitonin  2. DM- SSI, home medications lantus 3. SOB- await BNP to see if some component of CHF, check echo as well. 4. HTN- home meds 5. Cough- SLP for swallow eval as patient has PNA quite often 6. Leukocytosis- ? If on steroids recently (increased BUN as well)- monitor, will try to get records from SNF 7. Blind 8. Acute respiratory failure- O2    Code Status:  Family Communication: patient at bedside Disposition Plan: 2-3 days  Time spent: 43  Marlin Canary [facility] Pager [phone]  If 7PM-7AM, please contact night-coverage [email or web address] Password [facility] 02/14/2012, 12:58 PM

## 2012-02-14 NOTE — ED Notes (Signed)
JYN:[id number]<AO> Expected date:02/14/12<BR> Expected time: 9:32 AM<BR> Means of arrival:Ambulance<BR> Comments:<BR> 65yoF/congestion

## 2012-02-14 NOTE — ED Notes (Signed)
Per EMS-Patient is a resident of [facility]. Staff reported that the patient had a flu test and the results had not come back, but they needed to transfer her out to be evaluated in the ED. Patient had a CXR yesterday at the [facility] and it was negative for infiltrate and CHF. Patient placed on O2 4 L/min via [facility]

## 2012-02-14 NOTE — Progress Notes (Signed)
Echocardiogram 2D Echocardiogram has been performed.  Ninfa Giannelli 02/14/2012, 3:52 PM

## 2012-02-15 ENCOUNTER — Inpatient Hospital Stay (HOSPITAL_COMMUNITY): Payer: Medicare Other

## 2012-02-15 DIAGNOSIS — R0902 Hypoxemia: Secondary | ICD-10-CM

## 2012-02-15 DIAGNOSIS — D72829 Elevated white blood cell count, unspecified: Secondary | ICD-10-CM

## 2012-02-15 LAB — CBC
MCH: 27.8 pg (ref 26.0–34.0)
Platelets: 374 10*3/uL (ref 150–400)
RBC: 3.71 MIL/uL — ABNORMAL LOW (ref 3.87–5.11)
RDW: 14.8 % (ref 11.5–15.5)

## 2012-02-15 LAB — BLOOD GAS, ARTERIAL
Acid-Base Excess: 0.1 mmol/L (ref 0.0–2.0)
Drawn by: 295031
O2 Content: 2 L/min
O2 Saturation: 95 %
Patient temperature: 98.6
pO2, Arterial: 76.4 mmHg — ABNORMAL LOW (ref 80.0–100.0)

## 2012-02-15 LAB — BASIC METABOLIC PANEL
Calcium: 9 mg/dL (ref 8.4–10.5)
Creatinine, Ser: 1.48 mg/dL — ABNORMAL HIGH (ref 0.50–1.10)
GFR calc non Af Amer: 35 mL/min — ABNORMAL LOW (ref >90)
Glucose, Bld: 160 mg/dL — ABNORMAL HIGH (ref 70–99)
Sodium: 135 mEq/L (ref 135–145)

## 2012-02-15 LAB — GLUCOSE, CAPILLARY
Glucose-Capillary: 109 mg/dL — ABNORMAL HIGH (ref 70–99)
Glucose-Capillary: 170 mg/dL — ABNORMAL HIGH (ref 70–99)
Glucose-Capillary: 182 mg/dL — ABNORMAL HIGH (ref 70–99)

## 2012-02-15 LAB — URINE CULTURE

## 2012-02-15 MED ORDER — POTASSIUM CHLORIDE 10 MEQ/100ML IV SOLN
10.0000 meq | INTRAVENOUS | Status: AC
  Administered 2012-02-15 (×2): 10 meq via INTRAVENOUS
  Filled 2012-02-15 (×2): qty 100

## 2012-02-15 MED ORDER — FUROSEMIDE 10 MG/ML IJ SOLN
40.0000 mg | Freq: Once | INTRAMUSCULAR | Status: AC
  Administered 2012-02-15: 40 mg via INTRAVENOUS
  Filled 2012-02-15: qty 4

## 2012-02-15 MED ORDER — STARCH (THICKENING) PO POWD
ORAL | Status: DC | PRN
  Filled 2012-02-15: qty 227

## 2012-02-15 MED ORDER — VANCOMYCIN HCL 10 G IV SOLR
1250.0000 mg | INTRAVENOUS | Status: DC
  Administered 2012-02-16 – 2012-02-17 (×2): 1250 mg via INTRAVENOUS
  Filled 2012-02-15 (×3): qty 1250

## 2012-02-15 MED ORDER — BIOTENE DRY MOUTH MT LIQD
15.0000 mL | Freq: Two times a day (BID) | OROMUCOSAL | Status: DC
  Administered 2012-02-16 – 2012-02-26 (×19): 15 mL via OROMUCOSAL

## 2012-02-15 MED ORDER — RESOURCE THICKENUP CLEAR PO POWD
ORAL | Status: DC | PRN
  Filled 2012-02-15: qty 125

## 2012-02-15 NOTE — Clinical Social Work Psychosocial (Signed)
Clinical Social Work Department BRIEF PSYCHOSOCIAL ASSESSMENT 02/15/2012  Patient:  [name], [name]     Account Number:  [id number]     Admit date:  02/14/2012  Clinical Social Worker:  Jodelle Red  Date/Time:  02/15/2012 10:40 AM  Referred by:  Physician  Date Referred:  02/14/2012 Referred for  SNF Placement   Other Referral:   Interview type:  Other - See comment Other interview type:   Chart review, discussion with RN, SNF and guardian    PSYCHOSOCIAL DATA Living Status:  FACILITY Admitted from facility:  [facility] Level of care:  Skilled Nursing Facility Primary support name:  Georgann Housekeeper Primary support relationship to patient:   Degree of support available:   limited. Pt is ward of the state and has a guardian.    CURRENT CONCERNS Current Concerns  Post-Acute Placement   Other Concerns:    SOCIAL WORK ASSESSMENT / PLAN CSW called facility and guardian. Pt will return to [facility] at time of d/c per guardian and SNF. DSS makes all decisions for Pt and they want her to return to SNF.  CSW will update paperwork and assist with return to SNF. Facility will accept Pt back when she is ready.   Assessment/plan status:  Other - See comment Other assessment/ plan:   assist with return to SNF.   Information/referral to community resources:    PATIENT'S/FAMILY'S RESPONSE TO PLAN OF CARE: Guardian is decision maker and need to be involved in planning. Pt will return to SNF at [facility]. CSW to follow for return to SNF.    Doreen Salvage, LCSW ICU/Stepdown Clinical Social Worker [facility] Cell [phone] Hours 8am-1200pm M-F

## 2012-02-15 NOTE — Clinical Documentation Improvement (Signed)
CHF DOCUMENTATION CLARIFICATION QUERY  THIS DOCUMENT IS NOT A PERMANENT PART OF THE MEDICAL RECORD  TO RESPOND TO THE THIS QUERY, FOLLOW THE INSTRUCTIONS BELOW:  1. If needed, update documentation for the patient's encounter via the notes activity.  2. Access this query again and click edit on the In [employer].  3. After updating, or not, click F2 to complete all highlighted (required) fields concerning your review. Select "additional documentation in the medical record" OR "no additional documentation provided".  4. Click Sign note button.  5. The deficiency will fall out of your In Basket *Please let us know if you are not able to complete this workflow by phone or e-mail (listed below).  Please update your documentation within the medical record to reflect your response to this query.                                                                                    02/15/12  Dear Dr. Benjamine Mola, [facility],  In a better effort to capture your patient's severity of illness, reflect appropriate length of stay and utilization of resources, a review of the patient medical record has revealed the following indicators the diagnosis of Heart Failure.    Based on your clinical judgment, please clarify and document in a progress note and/or discharge summary the clinical condition associated with the following supporting information:  In responding to this query please exercise your independent judgment.  The fact that a query is asked, does not imply that any particular answer is desired or expected.  In this patient admitted with HCAP and Acute Resp failure a review of the medical record reveals the following:   Pt with concern for CHF per CXR 02/14/12  Cardiomegaly per CXR 02/14/12  Pulmonary vascular congestion CXR: 02/15/12  BNP=7013  EF=55-60%   Treatment:    furosemide 20 mg  carvedilol 3.125 mg   Clarification Needed:   Please clarify if you agree pt with CHF and  specify the acuity and type in the pn and d/c summary.  Thank you for all that you do for our patients!   Possible Clinical Conditions?   Chronic Systolic Congestive Heart Failure Chronic Diastolic Congestive Heart Failure Chronic Systolic & Diastolic Congestive Heart Failure Acute Systolic Congestive Heart Failure Acute Diastolic Congestive Heart Failure Acute Systolic & Diastolic Congestive Heart Failure Acute on Chronic Systolic Congestive Heart Failure Acute on Chronic Diastolic Congestive Heart Failure Acute on Chronic Systolic & Diastolic  Congestive Heart Failure Other Condition________________________________________ Cannot Clinically Determine  Supporting Information:  Risk Factors: HCARP DM. Acute resp failure.  Signs & Symptoms:   Increasing perihilar infiltrates or edema bilaterally per CXR  Cardiac enlargement  Diagnostics: CXR: 02/15/12 IMPRESSION: Cardiac enlargement with pulmonary vascular congestion.  Increasing perihilar infiltrates or edema bilaterally.   Original Report Authenticated By: Burman Nieves, M.D.    CXR 02/14/12 IMPRESSION:  1.  Mild cardiomegaly and edema is concerning for congestive heart failure. 2.  There is some airspace disease bilaterally which raises the possibility of infection as well.   Original Report Authenticated By: Marin Roberts, M.D.     Component     Latest Ref  Rng 02/14/2012        10:45 AM  Pro B Natriuretic peptide (BNP)     0 - 125 pg/mL 7013.0 (H)   Treatment:: Listed above  Reviewed: additional documentation in the medical record ljh  Thank You,  Enis Slipper  RN, BSN, MSN/Inf, CCDS Clinical Documentation Specialist [facility] Dept Pager: [phone] / E-mail: [email or web address]  Health Information Management [place]

## 2012-02-15 NOTE — Progress Notes (Signed)
Text page sent to attending MD that pt had +blood cultures for gram + cocci in clusters. No changes made at this point. Will continue to monitor.

## 2012-02-15 NOTE — Progress Notes (Signed)
[id number] Earlene Plater, RN, BSN, CCM:  CHART REVIEWED AND UPDATED.  Next chart review due on [id number]. NO DISCHARGE NEEDS PRESENT AT THIS TIME. CASE MANAGEMENT [phone]

## 2012-02-15 NOTE — Progress Notes (Addendum)
[facility] PROGRESS NOTE  [name] YNW:[id number] DOB: October 20, 1940 DOA: 02/14/2012 PCP: No primary provider on file.  Assessment/Plan: 1. HCAPNA- from a SNF, abx vanc/zosyn per pharmacy, afebrile, procalcitonin elevated, nebs PRN 2. pulm edema- stopped IVF in the ER, lasix IV PRN and replace K as needed, BNP was elevated but patient does have some kidney insuff- chest x ray supports PNA +/- pulm edema, echo pending, i/os positive and weight is up by 2 lbs, CE negative 3. Lethargy- ? Baseline, will get ABG 4. Acute respiratory failure- weaned O2 from 4L to 2L-- not on O2 at the facility 5.  DM- SSI, lantus (99-211) 6.  HTN- stable 7.  Leukocytosis- improving, continue to trend 8.  Blind 9.  Hypokalemia- replace  10.  CRI- stable, BUN elevate (not on steroids per record at SNF/check stool for blood) 11. 1/2 BC +: will continue to monitor- on abx   ABG ok, transfer to tele bed   Code Status:  Family Communication:  Disposition Plan: SNF once medically ready   Consultants:  none  Procedures:  echo  Antibiotics:  vanc/zosyn  HPI/Subjective: Sleepy. Mumbles but will not carry on conversation  Objective: Filed Vitals:   02/14/12 2003 02/15/12 0000 02/15/12 0228 02/15/12 0400  BP:  134/46  117/50  Pulse:  67  65  Temp:  99.1 F (37.3 C)  98.7 F (37.1 C)  TempSrc:  Oral  Oral  Resp:  19  16  Height:      Weight:    89.6 kg (197 lb 8.5 oz)  SpO2: 98% 97% 93% 99%    Intake/Output Summary (Last 24 hours) at 02/15/12 0733 Last data filed at 02/15/12 0600  Gross per 24 hour  Intake 1227.5 ml  Output      0 ml  Net 1227.5 ml   Filed Weights   02/14/12 1500 02/15/12 0400  Weight: 88.6 kg (195 lb 5.2 oz) 89.6 kg (197 lb 8.5 oz)    Exam:   General:  Sleepy, not cooeprative  Cardiovascular: rrr  Respiratory: no wheezing, decreased BS  Abdomen: +BS, obese  Data Reviewed: Basic Metabolic Panel:  Lab 02/15/12 6578 02/14/12 1045  NA 135 138  K  3.4* 4.6  CL 98 97  CO2 24 22  GLUCOSE 160* 96  BUN 72* 71*  CREATININE 1.48* 1.48*  CALCIUM 9.0 9.1  MG -- --  PHOS -- --   Liver Function Tests:  Lab 02/14/12 1045  AST 40*  ALT 24  ALKPHOS 174*  BILITOT 0.8  PROT 7.8  ALBUMIN 2.4*   No results found for this basename: LIPASE:5,AMYLASE:5 in the last 168 hours  Lab 02/14/12 1045  AMMONIA 32   CBC:  Lab 02/15/12 0345 02/14/12 1045  WBC 21.2* 26.9*  NEUTROABS -- 24.7*  HGB 10.3* 10.4*  HCT 31.1* 32.2*  MCV 83.8 86.1  PLT 374 385   Cardiac Enzymes:  Lab 02/14/12 1045  CKTOTAL --  CKMB --  CKMBINDEX --  TROPONINI <0.30   BNP (last 3 results)  Basename 02/14/12 1045 06/23/11 0900  PROBNP 7013.0* 1570.0*   CBG:  Lab 02/14/12 2210 02/14/12 1651  GLUCAP 211* 111*    Recent Results (from the past 240 hour(s))  MRSA PCR SCREENING     Status: Normal   Collection Time   02/14/12  3:08 PM      Component Value Range Status Comment   MRSA by PCR NEGATIVE  NEGATIVE Final      Studies: Ct Head  Wo Contrast  02/14/2012  *RADIOLOGY REPORT*  Clinical Data: Altered mental status.  History of weakness and TIA.  CT HEAD WITHOUT CONTRAST  Technique:  Contiguous axial images were obtained from the base of the skull through the vertex without contrast.  Comparison: 12/25/2008  Findings: Bone windows demonstrate similar abnormal appearance of the right globe, presumably related to prior trauma or hemorrhage. There are also similar calcifications about the left globe.  Mucosal thickening of the sphenoid sinus and ethmoid air cells.  A left mastoid effusion is again identified.  Soft tissue windows demonstrate similar moderate hydrocephalus, including the fourth, third, and lateral ventricles.  Slight increase in periventricular white matter hypoattenuation.  Redemonstration of right frontal hypoattenuation which is felt to be similar, including on image 20/series 2.  No mass lesion, hemorrhage, intra-axial, or extra-axial fluid  collection.  IMPRESSION:  1.  Similar hydrocephalus. 2.  Slight increase in periventricular white matter hypoattenuation.  Favor small vessel ischemic change.  A component of transependymal CSF resorption could look similar. 3.  Right frontal lobe remote infarct. 4.  Sinus disease with a left mastoid effusion.   Original Report Authenticated By: Jeronimo Greaves, M.D.    Portable Chest 1 View  02/15/2012  *RADIOLOGY REPORT*  Clinical Data: CHF.  PORTABLE CHEST - 1 VIEW  Comparison: 02/14/2012  Findings: Shallow inspiration.  Cardiac enlargement with pulmonary vascular congestion and perihilar infiltration suggesting edema or pneumonia.  The infiltration is slightly more prominent than previous study.  No pneumothorax or effusion.  IMPRESSION: Cardiac enlargement with pulmonary vascular congestion.  Increasing perihilar infiltrates or edema bilaterally.   Original Report Authenticated By: Burman Nieves, M.D.    Dg Chest Portable 1 View  02/14/2012  *RADIOLOGY REPORT*  Clinical Data: Shortness of breath and diabetes.  Hypertension.  PORTABLE CHEST - 1 VIEW  Comparison: Two-view chest x-ray and CT of the chest 06/22/2011.  Findings: The heart is mildly enlarged.  Lung volumes are low. Mild interstitial and airspace disease is present bilaterally. The bone windows are unremarkable.  IMPRESSION:  1.  Mild cardiomegaly and edema is concerning for congestive heart failure. 2.  There is some airspace disease bilaterally which raises the possibility of infection as well.   Original Report Authenticated By: Marin Roberts, M.D.     Scheduled Meds:   . albuterol  2.5 mg Nebulization Once  . albuterol  2.5 mg Nebulization Q6H  . antiseptic oral rinse  15 mL Mouth Rinse QID  . aspirin EC  325 mg Oral Daily  . brimonidine  1 drop Left Eye BID  . brinzolamide  1 drop Left Eye BID  . carvedilol  3.125 mg Oral Q breakfast  . chlorhexidine  15 mL Mouth/Throat BID  . docusate sodium  100 mg Oral BID  . feeding  supplement  30 mL Oral TID WC  . furosemide  20 mg Oral BID  . gabapentin  100 mg Oral QHS  . insulin aspart  0-15 Units Subcutaneous TID WC  . insulin aspart  0-5 Units Subcutaneous QHS  . insulin glargine  10 Units Subcutaneous QHS  . ipratropium  0.5 mg Nebulization Q6H  . metoCLOPramide  5 mg Oral TID WC  . multivitamin with minerals  1 tablet Oral Daily  . piperacillin-tazobactam (ZOSYN)  IV  3.375 g Intravenous Q8H  . sodium chloride  3 mL Intravenous Q12H  . sodium chloride  3 mL Intravenous Q12H  . vancomycin  1,000 mg Intravenous Q24H   Continuous Infusions:  Principal Problem:  *HCAP (healthcare-associated pneumonia) Active Problems:  DM  BLINDNESS, BILATERAL  ESSENTIAL HYPERTENSION  SOB (shortness of breath)  Cough  Acute respiratory failure  Leukocytosis    Time spent: 35    [facility], Meriel Kelliher  [facility] Pager [phone]. If 8PM-8AM, please contact night-coverage at [email or web address], password [facility] 02/15/2012, 7:33 AM  LOS: 1 day

## 2012-02-15 NOTE — Clinical Social Work Note (Signed)
CSW received consult and will assess for return to SNF this am.   Doreen Salvage, LCSW ICU/Stepdown Clinical Social Worker [facility] Cell [phone] Hours 8am-1200pm M-F

## 2012-02-15 NOTE — Progress Notes (Signed)
Speech Language Pathology Dysphagia Treatment Patient Details Name: [name] MRN: [id number] DOB: [birth year] Today's Date: 02/15/2012 Time: 4098-1191 SLP Time Calculation (min): 45 min  Assessment / Plan / Recommendation Clinical Impression  Patient more cooperative today.  SLP observed and assisted patient with lunch (eating late).  Very congested/wet cough noted prior to p.o.'s given, but also throughout meal.  Cough is frequent, but intermittent, and difficult to assess if related to swallowing or not, however, in light of recurrent pneumonia's and previous dysphagia and esophageal issues, an objective swallow study is indicated.  Pt. was educated to [facility] and agrees to participate with  MBS in am.  Lung sounds with rhonchi, and CXR with increasing infiltrates.    Diet Recommendation  Initiate / Change Diet: Dysphagia 3 (mechanical soft);Nectar-thick liquid    SLP Plan MBS;New goals to be determined pending objective testing   Pertinent Vitals/Pain n/a   Swallowing Goals     General Temperature Spikes Noted: No Respiratory Status: Supplemental O2 delivered via (comment) Behavior/Cognition: Alert;Cooperative;Requires cueing;Decreased sustained attention (Blind, joking around with staff.) Oral Cavity - Dentition: Adequate natural dentition Patient Positioning: Upright in bed  Oral Cavity - Oral Hygiene Does patient have any of the following "at risk" factors?: Oxygen therapy - cannula, mask, simple oxygen devices Brush patient's teeth BID with toothbrush (using toothpaste with fluoride): Yes Patient is HIGH RISK - Oral Care Protocol followed (see row info): Yes Patient is AT RISK - Oral Care Protocol followed (see row info): Yes Patient is mechanically ventilated, follow VAP prevention protocol for oral care: Oral care provided every 4 hours   Dysphagia Treatment Treatment focused on: Skilled observation of diet tolerance;Patient/family/caregiver education Treatment  Methods/Modalities: Skilled observation;Differential diagnosis Patient observed directly with PO's: Yes Type of PO's observed: Dysphagia 3 (soft);Nectar-thick liquids;Thin liquids Feeding: Total assist Liquids provided via: Teaspoon;Cup Pharyngeal Phase Signs & Symptoms: Multiple swallows;Delayed cough;Watery eyes;Wet vocal quality;Audible swallow Type of cueing: Verbal;Tactile Amount of cueing: Moderate   GO     Maryjo Rochester T 02/15/2012, 4:33 PM

## 2012-02-15 NOTE — Progress Notes (Signed)
ANTIBIOTIC CONSULT NOTE - FOLLOW UP  Pharmacy Consult for Vanc, Zosyn Indication: presumed HCAP  No Known Allergies  Patient Measurements: Height: 5\' 2"  (157.5 cm) Weight: 197 lb 8.5 oz (89.6 kg) IBW/kg (Calculated) : 50.1    Labs:  Basename 02/15/12 0345 02/14/12 1045  WBC 21.2* 26.9*  HGB 10.3* 10.4*  PLT 374 385  LABCREA -- --  CREATININE 1.48* 1.48*   Estimated Creatinine Clearance: 37.3 ml/min (by C-G formula based on Cr of 1.48). No results found for this basename: VANCOTROUGH:2,VANCOPEAK:2,VANCORANDOM:2,GENTTROUGH:2,GENTPEAK:2,GENTRANDOM:2,TOBRATROUGH:2,TOBRAPEAK:2,TOBRARND:2,AMIKACINPEAK:2,AMIKACINTROU:2,AMIKACIN:2, in the last 72 hours   Assessment:  66 yof resident of [facility] presented 1/16 with c/o SOB. Flu test obtained at SNF and CXR negative for PNA per MD note.  Patient reported h/o recurrent PNA. Azith PO and Rocephin IM @ SNF started 1/15.  CXR here cannot r/o infectious process.   Today is day#2 vanc and zosyn for presumed HCAP.  Patient is afeb, WBC improving to 21.2K, Scr 1.48 for CG CrCl 37 and normalized CrCl 41 ml/min.    Blood and urine cultures pending.  Influenza panel here negative, and mrsa pcr negative. CXR today with increasing perihilar infiltrates or edema bilaterally.   Goal of Therapy:  Vancomycin trough level 15-20 mcg/ml  Plan:   Increase Vanc to 1250 mg IV q24h  Pharmacy will f/u   Geoffry Paradise, PharmD, BCPS Pager: [phone] 1:18 PM Pharmacy #: [id number]

## 2012-02-15 NOTE — Clinical Documentation Improvement (Signed)
Abnormal Labs Clarification  THIS DOCUMENT IS NOT A PERMANENT PART OF THE MEDICAL RECORD  TO RESPOND TO THE THIS QUERY, FOLLOW THE INSTRUCTIONS BELOW:  1. If needed, update documentation for the patient's encounter via the notes activity.  2. Access this query again and click edit on the [employer].  3. After updating, or not, click F2 to complete all highlighted (required) fields concerning your review. Select "additional documentation in the medical record" OR "no additional documentation provided".  4. Click Sign note button.  5. The deficiency will fall out of your InBasket *Please let us know if you are not able to complete this workflow by phone or e-mail (listed below).  Please update your documentation within the medical record to reflect your response to this query.                                                                                   02/15/12  Dear Dr. Benjamine Mola, [facility]  In a better effort to capture your patient's severity of illness, reflect appropriate length of stay and utilization of resources, a review of the medical record has revealed the following indicators.    Based on your clinical judgment, please clarify and document in a progress note and/or discharge summary the clinical condition associated with the following supporting information:  In responding to this query please exercise your independent judgment.  The fact that a query is asked, does not imply that any particular answer is desired or expected.  Abnormal findings (laboratory, x-ray, pathologic, and other diagnostic results) are not coded and reported unless the physician indicates their clinical significance.   The medical record reflects the following clinical findings, please clarify the diagnostic and/or clinical significance:      In this patient admitted with HCAP and Acute Resp failure a review of the medical record reveals the following   Abnormal BUN/CR  Abnormal  GFR  Treatment:  Monitoring   0.9 % sodium chloride infusion    Clarification Needed   Please clarify the underlying diagnosis responsible for the abnormal lab values and document in pn and d/c summary.  Thank you for all that you do for our patients!    Possible Clinical Conditions?                                  Other Condition___________________                  Cannot Clinically Determine_________   Risk Factors HCAP, DM. Acute resp failure.     Diagnostics: Component     Latest Ref Rng 02/14/2012        10:45 AM  BUN     6 - 23 mg/dL 71 (H)  Creatinine     0.50 - 1.10 mg/dL 9.56 (H)   Component     Latest Ref Rng 02/15/2012          BUN     6 - 23 mg/dL 72 (H)  Creatinine     0.50 - 1.10 mg/dL 2.13 (H)   Component     Latest Ref Rng  02/14/2012        10:45 AM  GFR calc Af Amer     >90 mL/min 41 (L)   Component     Latest Ref Rng 02/15/2012          GFR calc Af Amer     >90 mL/min 41 (L)    Reviewed:  no additional documentation provided   Thank You,  Enis Slipper  RN, BSN, MSN/Inf, CCDS Clinical Documentation Specialist [facility] HIM Dept Pager: [phone] / E-mail: [email or web address]  Health Information Management [place]

## 2012-02-16 ENCOUNTER — Inpatient Hospital Stay (HOSPITAL_COMMUNITY): Payer: Medicare Other

## 2012-02-16 DIAGNOSIS — N179 Acute kidney failure, unspecified: Secondary | ICD-10-CM

## 2012-02-16 DIAGNOSIS — A4901 Methicillin susceptible Staphylococcus aureus infection, unspecified site: Secondary | ICD-10-CM

## 2012-02-16 DIAGNOSIS — R7881 Bacteremia: Secondary | ICD-10-CM

## 2012-02-16 LAB — CBC
Hemoglobin: 10.4 g/dL — ABNORMAL LOW (ref 12.0–15.0)
MCV: 83.8 fL (ref 78.0–100.0)
Platelets: 343 10*3/uL (ref 150–400)
RBC: 3.82 MIL/uL — ABNORMAL LOW (ref 3.87–5.11)
WBC: 19.9 10*3/uL — ABNORMAL HIGH (ref 4.0–10.5)

## 2012-02-16 LAB — GLUCOSE, CAPILLARY
Glucose-Capillary: 156 mg/dL — ABNORMAL HIGH (ref 70–99)
Glucose-Capillary: 163 mg/dL — ABNORMAL HIGH (ref 70–99)

## 2012-02-16 LAB — BASIC METABOLIC PANEL
CO2: 23 mEq/L (ref 19–32)
Chloride: 94 mEq/L — ABNORMAL LOW (ref 96–112)
Creatinine, Ser: 1.73 mg/dL — ABNORMAL HIGH (ref 0.50–1.10)
Glucose, Bld: 203 mg/dL — ABNORMAL HIGH (ref 70–99)
Sodium: 131 mEq/L — ABNORMAL LOW (ref 135–145)

## 2012-02-16 LAB — SODIUM, URINE, RANDOM: Sodium, Ur: 33 mEq/L

## 2012-02-16 MED ORDER — HYDROCODONE-HOMATROPINE 5-1.5 MG/5ML PO SYRP
5.0000 mL | ORAL_SOLUTION | Freq: Four times a day (QID) | ORAL | Status: DC | PRN
  Administered 2012-02-16: 5 mL via ORAL
  Filled 2012-02-16: qty 5

## 2012-02-16 MED ORDER — SODIUM CHLORIDE 0.9 % IV SOLN: INTRAVENOUS | Status: DC

## 2012-02-16 MED ORDER — ALBUTEROL SULFATE (5 MG/ML) 0.5% IN NEBU
2.5000 mg | INHALATION_SOLUTION | RESPIRATORY_TRACT | Status: DC | PRN
  Administered 2012-02-18: 2.5 mg via RESPIRATORY_TRACT

## 2012-02-16 MED ORDER — IPRATROPIUM BROMIDE 0.02 % IN SOLN
0.5000 mg | Freq: Four times a day (QID) | RESPIRATORY_TRACT | Status: DC
  Administered 2012-02-17 (×2): 0.5 mg via RESPIRATORY_TRACT
  Filled 2012-02-16 (×2): qty 2.5

## 2012-02-16 MED ORDER — CEFAZOLIN SODIUM 1-5 GM-% IV SOLN
1.0000 g | Freq: Three times a day (TID) | INTRAVENOUS | Status: DC
  Filled 2012-02-16: qty 50

## 2012-02-16 MED ORDER — LEVOFLOXACIN IN D5W 750 MG/150ML IV SOLN
750.0000 mg | INTRAVENOUS | Status: DC
  Administered 2012-02-16: 750 mg via INTRAVENOUS
  Filled 2012-02-16: qty 150

## 2012-02-16 MED ORDER — PIPERACILLIN-TAZOBACTAM 3.375 G IVPB 30 MIN
3.3750 g | Freq: Three times a day (TID) | INTRAVENOUS | Status: DC
  Administered 2012-02-16 – 2012-02-17 (×3): 3.375 g via INTRAVENOUS
  Filled 2012-02-16 (×4): qty 50

## 2012-02-16 MED ORDER — ALBUTEROL SULFATE (5 MG/ML) 0.5% IN NEBU
2.5000 mg | INHALATION_SOLUTION | Freq: Four times a day (QID) | RESPIRATORY_TRACT | Status: DC
  Administered 2012-02-17 (×2): 2.5 mg via RESPIRATORY_TRACT
  Filled 2012-02-16 (×2): qty 0.5

## 2012-02-16 NOTE — Progress Notes (Signed)
Staphylococcus aureus bacteremia (SAB) is associated with a high rate of complications and mortality.   Specific aspects of clinical management are critical to optimizing the outcome of patients with SAB. Therefore, the [facility] Health Antimicrobial Management Team ([facility]) have initiated an intervention aimed at improving the management of SAB at [employer]. To do so, Infectious Diseases Consultants are providing evidence-based recommendations for the management of all patients with SAB. The specific recommendations for this patient are marked "X" in this document.   Recommended Arly.Keller ] Completed [02/16/11] Perform follow-up blood cultures (even if the patient is afebrile) to ensure clearance of bacteremia.   Recommended [ ]  Completed [date] Remove vascular catheter, and obtain follow-up cultures after removal of catheter NA   Recommended Arly.Keller ] Completed [date] Perform echocardiography to evaluate for endocarditis (transthoracic ECHO is 40-50% sensitive, TEE is > 90% sensitive).*   Please keep in mind, that neither test can definitively EXCLUDE endocarditis, and that should clinical suspicion remain high for endocarditis the patient should then still be treated with an "endocarditis" duration of therapy = 6 weeks   Recommended [ ]  Completed [date] Consult electrophysiologist to evaluate implanted cardiac device (pacemaker, ICD) NA   Recommended [ X ] Completed [date] Ensure source control.   Have all abscesses been drained effectively?  Have deep seeded infections (septic joints or osteomyelitis) had appropriate surgical debridement?  NO  I think she is going to need surgical attention and likely amputation of her necrotic toe.  I will get mRI and plain films of this foot  She will need Orthopedic consult   Recommended [x ] Completed [date] Investigate for "metastatic" sites of infection.   Does the patient have ANY symptom or physical exam finding that would suggest a deeper infection (back or neck  pain that may be suggestive of vertebral osteomyelitis or epidural abscess, muscle pain that could be a symptom of pyomyositis)?  Keep in mind that for deep seeded infections MRI imaging with contrast is preferred rather than other often insensitive tests such as plain x-rays, especially early in a patient's presentation.   See above re necrotic toe   Recommended [ x ]   Continue vancomycin plus ancef pending ID of organism   [ x ] Estimated duration of IV Antibiotic therapy:  6-8 weeks  [x ] Consult case management for probable prolonged outpatient intravenous antibiotic therapy

## 2012-02-16 NOTE — Consult Note (Signed)
[facility]    Date of Admission:  02/14/2012  Date of Consult:  02/16/2012  Reason for Consult Staphylococcus Aureus Bacteremia Referring Physician:  Automatic consult for SAB , attending MD for team is Dr Aaron Mose   HPI: [name] is an 72 y.o. female. With DM, HTN , lkely PVD admitted with fevers, hypoxia who was being rx for HCAP and now has been found to have Staph Aureus Bacteremia (SAB). On exam she has a necrotic toe on the right side that is c/w dry gangrene. She states that she is bed and wheel chair bound due to pain of severe osteoarthritis   She states that this toe has been doing poorly for weeks--she is blind however and sensation in this toe is poor. I suspect it is the source of her bacteremia.   Past Medical History  Diagnosis Date  . Hypertension   . Diabetes mellitus   . Blindness   . TIA (transient ischemic attack)   . Anemia   . GERD (gastroesophageal reflux disease)   . Renal disorder   . Constipation     Past Surgical History  Procedure Date  . Gallbladder surgery   . Esophageal dilation   . Cholecystectomy   ergies:   No Known Allergies   Medications: I have reviewed patients current medications as documented in Epic Anti-infectives     Start     Dose/Rate Route Frequency Ordered Stop   02/16/12 1800   ceFAZolin (ANCEF) IVPB 1 g/50 mL premix  Status:  Discontinued        1 g 100 mL/hr over 30 Minutes Intravenous Every 8 hours 02/16/12 1357 02/16/12 1409   02/16/12 1600   levofloxacin (LEVAQUIN) IVPB 750 mg        750 mg 100 mL/hr over 90 Minutes Intravenous Every 48 hours 02/16/12 1415     02/16/12 1500  piperacillin-tazobactam (ZOSYN) IVPB 3.375 g       3.375 g 100 mL/hr over 30 Minutes Intravenous 3 times per day 02/16/12 1415     02/16/12 1000   vancomycin (VANCOCIN) 1,250 mg in sodium chloride 0.9 % 250 mL IVPB        1,250 mg 166.7 mL/hr over 90 Minutes Intravenous Every 24 hours 02/15/12 1318     02/15/12 1300   vancomycin (VANCOCIN) IVPB 1000 mg/200 mL premix  Status:  Discontinued        1,000 mg 200 mL/hr over 60 Minutes Intravenous Every 24 hours 02/14/12 1650 02/15/12 1318   02/14/12 2000   piperacillin-tazobactam (ZOSYN) IVPB 3.375 g  Status:  Discontinued        3.375 g 12.5 mL/hr over 240 Minutes Intravenous Every 8 hours 02/14/12 1650 02/16/12 1356   02/14/12 1145   vancomycin (VANCOCIN) IVPB 1000 mg/200 mL premix        1,000 mg 200 mL/hr over 60 Minutes Intravenous  Once 02/14/12 1143 02/14/12 1352   02/14/12 1145  piperacillin-tazobactam (ZOSYN) IVPB 3.375 g       3.375 g 100 mL/hr over 30 Minutes Intravenous  Once 02/14/12 1143 02/14/12 1244          Social History:  reports that she has never smoked. Her smokeless tobacco use includes Snuff. She reports that she does not drink alcohol or use illicit drugs.  History reviewed. No pertinent family history.  As in HPI and primary teams notes otherwise 12 point review of systems is negative  Blood pressure 109/64, pulse 80, temperature  97.8 F (36.6 C), temperature source Oral, resp. rate 18, height 5\' 2"  (1.575 m), weight 200 lb 2.8 oz (90.8 kg), SpO2 94.00%. General: Alert and awake, oriented, not in any acute distress but later tearful. HEENT: blind , oropharynx clear and without exudate CVS regular rate, normal r,  no murmur rubs or gallops Chest: fairly clear to auscultation bilaterally, no wheezing, rales or rhonchi Abdomen: soft nontender, nondistended, normal bowel sounds, Extremities: pt with necrotic toe black Neuro: lower extremities weak compromised by pain  Results for orders placed during the hospital encounter of 02/14/12 (from the past 48 hour(s))  OCCULT BLOOD X 1 CARD TO LAB, STOOL     Status: Normal   Collection Time   02/14/12 10:05 PM      Component Value Range Comment   Fecal Occult Bld NEGATIVE  NEGATIVE   GLUCOSE, CAPILLARY     Status: Abnormal   Collection Time   02/14/12 10:10 PM        Component Value Range Comment   Glucose-Capillary 211 (*) 70 - 99 mg/dL   BASIC METABOLIC PANEL     Status: Abnormal   Collection Time   02/15/12  3:45 AM      Component Value Range Comment   Sodium 135  135 - 145 mEq/L    Potassium 3.4 (*) 3.5 - 5.1 mEq/L    Chloride 98  96 - 112 mEq/L    CO2 24  19 - 32 mEq/L    Glucose, Bld 160 (*) 70 - 99 mg/dL    BUN 72 (*) 6 - 23 mg/dL    Creatinine, Ser 1.61 (*) 0.50 - 1.10 mg/dL    Calcium 9.0  8.4 - 09.6 mg/dL    GFR calc non Af Amer 35 (*) >90 mL/min    GFR calc Af Amer 41 (*) >90 mL/min   CBC     Status: Abnormal   Collection Time   02/15/12  3:45 AM      Component Value Range Comment   WBC 21.2 (*) 4.0 - 10.5 K/uL    RBC 3.71 (*) 3.87 - 5.11 MIL/uL    Hemoglobin 10.3 (*) 12.0 - 15.0 g/dL    HCT 04.5 (*) 40.9 - 46.0 %    MCV 83.8  78.0 - 100.0 fL    MCH 27.8  26.0 - 34.0 pg    MCHC 33.1  30.0 - 36.0 g/dL    RDW 81.1  91.4 - 78.2 %    Platelets 374  150 - 400 K/uL   GLUCOSE, CAPILLARY     Status: Abnormal   Collection Time   02/15/12  7:48 AM      Component Value Range Comment   Glucose-Capillary 113 (*) 70 - 99 mg/dL    Comment 1 Documented in Chart      Comment 2 Notify RN     BLOOD GAS, ARTERIAL     Status: Abnormal   Collection Time   02/15/12  7:50 AM      Component Value Range Comment   O2 Content 2.0      Delivery systems NASAL CANNULA      pH, Arterial 7.370  7.350 - 7.450    pCO2 arterial 44.3  35.0 - 45.0 mmHg    pO2, Arterial 76.4 (*) 80.0 - 100.0 mmHg    Bicarbonate 25.0 (*) 20.0 - 24.0 mEq/L    TCO2 22.7  0 - 100 mmol/L    Acid-Base Excess 0.1  0.0 - 2.0 mmol/L  O2 Saturation 95.0      Patient temperature 98.6      Collection site RIGHT RADIAL      Drawn by [phone]      Sample type ARTERIAL DRAW      Allens test (pass/fail) PASS  PASS   GLUCOSE, CAPILLARY     Status: Abnormal   Collection Time   02/15/12 12:22 PM      Component Value Range Comment   Glucose-Capillary 109 (*) 70 - 99 mg/dL    Comment  1 Documented in Chart      Comment 2 Notify RN     GLUCOSE, CAPILLARY     Status: Abnormal   Collection Time   02/15/12  5:00 PM      Component Value Range Comment   Glucose-Capillary 170 (*) 70 - 99 mg/dL   GLUCOSE, CAPILLARY     Status: Abnormal   Collection Time   02/15/12 11:04 PM      Component Value Range Comment   Glucose-Capillary 182 (*) 70 - 99 mg/dL   CBC     Status: Abnormal   Collection Time   02/16/12  6:29 AM      Component Value Range Comment   WBC 19.9 (*) 4.0 - 10.5 K/uL    RBC 3.82 (*) 3.87 - 5.11 MIL/uL    Hemoglobin 10.4 (*) 12.0 - 15.0 g/dL    HCT 81.1 (*) 91.4 - 46.0 %    MCV 83.8  78.0 - 100.0 fL    MCH 27.2  26.0 - 34.0 pg    MCHC 32.5  30.0 - 36.0 g/dL    RDW 78.2  95.6 - 21.3 %    Platelets 343  150 - 400 K/uL   PROCALCITONIN     Status: Normal   Collection Time   02/16/12  6:58 AM      Component Value Range Comment   Procalcitonin 1.69     BASIC METABOLIC PANEL     Status: Abnormal   Collection Time   02/16/12  6:58 AM      Component Value Range Comment   Sodium 131 (*) 135 - 145 mEq/L    Potassium 4.0  3.5 - 5.1 mEq/L    Chloride 94 (*) 96 - 112 mEq/L    CO2 23  19 - 32 mEq/L    Glucose, Bld 203 (*) 70 - 99 mg/dL    BUN 74 (*) 6 - 23 mg/dL    Creatinine, Ser 0.86 (*) 0.50 - 1.10 mg/dL    Calcium 8.8  8.4 - 57.8 mg/dL    GFR calc non Af Amer 29 (*) >90 mL/min    GFR calc Af Amer 34 (*) >90 mL/min   GLUCOSE, CAPILLARY     Status: Abnormal   Collection Time   02/16/12  8:00 AM      Component Value Range Comment   Glucose-Capillary 234 (*) 70 - 99 mg/dL   SODIUM, URINE, RANDOM     Status: Normal   Collection Time   02/16/12  8:37 AM      Component Value Range Comment   Sodium, Ur 33     CREATININE, URINE, RANDOM     Status: Normal   Collection Time   02/16/12  8:37 AM      Component Value Range Comment   Creatinine, Urine 100.8     GLUCOSE, CAPILLARY     Status: Abnormal   Collection Time   02/16/12 12:04 PM  Component Value Range  Comment   Glucose-Capillary 156 (*) 70 - 99 mg/dL   GLUCOSE, CAPILLARY     Status: Abnormal   Collection Time   02/16/12  4:14 PM      Component Value Range Comment   Glucose-Capillary 163 (*) 70 - 99 mg/dL   GLUCOSE, CAPILLARY     Status: Abnormal   Collection Time   02/16/12  8:03 PM      Component Value Range Comment   Glucose-Capillary 133 (*) 70 - 99 mg/dL    Comment 1 Notify RN         Component Value Date/Time   SDES URINE, CATHETERIZED 02/14/2012 1046   SPECREQUEST NONE 02/14/2012 1046   CULT NO GROWTH 02/14/2012 1046   REPTSTATUS 02/15/2012 FINAL 02/14/2012 1046   Portable Chest 1 View  02/15/2012  *RADIOLOGY REPORT*  Clinical Data: CHF.  PORTABLE CHEST - 1 VIEW  Comparison: 02/14/2012  Findings: Shallow inspiration.  Cardiac enlargement with pulmonary vascular congestion and perihilar infiltration suggesting edema or pneumonia.  The infiltration is slightly more prominent than previous study.  No pneumothorax or effusion.  IMPRESSION: Cardiac enlargement with pulmonary vascular congestion.  Increasing perihilar infiltrates or edema bilaterally.   Original Report Authenticated By: Burman Nieves, M.D.    Dg Swallowing Func-speech Pathology  02/16/2012  Chales Abrahams, CCC-SLP     02/16/2012 10:47 AM Objective Swallowing Evaluation: Modified Barium Swallowing Study   Patient Details  Name: [name] MRN: [id number] Date of Birth: 01/26/1941  Today's Date: 02/16/2012 Time: 0950-1015 SLP Time Calculation (min): 25 min  Past Medical History:  Past Medical History  Diagnosis Date  . Hypertension   . Diabetes mellitus   . Blindness   . TIA (transient ischemic attack)   . Anemia   . GERD (gastroesophageal reflux disease)   . Renal disorder   . Constipation    Past Surgical History:  Past Surgical History  Procedure Date  . Gallbladder surgery   . Esophageal dilation   . Cholecystectomy    HPI:  72 yo female adm to [facility] from [facility]F with  hypoxia, CXR + fluid, +/- pna-  diagnosed with HCAP, ? CHF (await  BNP).  Per MD notes, pt told MD that she "always has pna."   PMH  + for TIA, GERD, anemia, constipation, HTN, blindness/HOH. SLP  phoned SNF SLP who reports she has not seen pt, she has however  seen the pt's roommate during meals and has not noted pt coughing  during meals.  Per SNF SLP, pt was recently "flagged for weight  loss" .  Esophageal dilatation completed in 03/1999 and pt states  she had problems swallowing before surgery that resolved after.    BSE completed Thursday pm and follow up Friday with  recommendation for MBS due to inability to rule out  dysphagia/aspiration at bedside with pt overtly coughing.     Assessment / Plan / Recommendation Clinical Impression  Dysphagia Diagnosis: Suspected primary esophageal dysphagia;Mild  oral phase dysphagia;Mild pharyngeal phase dysphagia   Clinical impression: Pt presents with mild oropharyngeal and  suspected primary esophageal dysphagia.  Decreased oral control  results in premature spillage of liquids into pharynx and  piecemeal deglutition with solids/pudding as well as oral stasis  with poor awareness.  Cued swallow facilitated timeliness of  swallow.  Pharyngeal dysphagia with delayed swallow initiation to  pyriform sinus with liquids, to vallecular space with  solid/pudding and  resultant trace silent penetration of liquids.  Cued throat clear removed trace penetrates but pt requiried  verbal cues x4 to complete and became agitated.  Pt prematurely  spills pudding/cracker filling entire vallecular space for longer  than 30 seconds as she continued to orally manipulate remainder  of bolus before she actually triggers a swallow.  No aspiration  or penetration with pudding/cracker noted.  Pt did not arrive to  test with her dentures unfortunately, therefore cracker was  softened.       Suspect esophageal deficits significantly contribute to  oropharyngeal deficits.  Appearance of prominent cricopharyngeus  with retention  of secretions x1, CP did not impact barium flow.   However pt did appear with stasis from distal to mid esophagus  with distal narrowing.  Above area of narrowed region, esophagus  appeared dilated (? Motility issues).  Retrograde propulsion of  barium occurred WITHOUT pt awareness.   Suspect pt's primary  dysphagia is esophageal.    Pt was instructed to compensatory strategies, testing findings  and recommendations during the procedure. Pt may benefit from GI  referral for esophageal dysphagia management (h/o esophageal  dilatation in 2001).  In the interim time, rec liquid diet in  small amounts for maximal airway protection and medications be  crushed given with liquids if not contraindicated.  SLP to  follow.  Thanks.       Treatment Recommendation  Therapy as outlined in treatment plan below    Diet Recommendation Thin liquid (All Liquids)   Liquid Administration via: Cup;Straw Medication Administration: Other (Comment) (crush with liquids) Supervision: Full supervision/cueing for compensatory strategies Compensations: Small sips/bites;Slow rate;Clear throat  intermittently Postural Changes and/or Swallow Maneuvers: Seated upright 90  degrees;Upright 30-60 min after meal    Other  Recommendations Recommended Consults: Consider GI  evaluation Oral Care Recommendations: Oral care BID   Follow Up Recommendations       Frequency and Duration min 2x/week  1 week   Pertinent Vitals/Pain     SLP Swallow Goals Patient will utilize recommended strategies during swallow to  increase swallowing safety with: Total assistance   General HPI: 72 yo female adm to [facility] from [facility]  SNF with hypoxia, CXR + fluid, +/- pna- diagnosed with HCAP, ?  CHF (await BNP).  Per MD notes, pt told MD that she "always has  pna."   PMH + for TIA, GERD, anemia, constipation, HTN,  blindness/HOH. SLP phoned SNF SLP who reports she has not seen  pt, she has however seen the pt's roommate during meals and has  not noted pt coughing  during meals.  Per SNF SLP, pt was recently  "flagged for weight loss" .  Esophageal dilatation completed in  03/1999 and pt states she had problems swallowing before surgery  that resolved after.   BSE completed Thursday pm and follow up  Friday with recommendation for MBS due to inability to rule out  dysphagia/aspiration at bedside with pt overtly coughing. Type of Study: Modified Barium Swallowing Study Reason for Referral: Objectively evaluate swallowing function Diet Prior to this Study: Nectar-thick liquids;Regular Temperature Spikes Noted: No Respiratory Status: Supplemental O2 delivered via (comment) Behavior/Cognition: Alert;Cooperative;Requires cueing;Decreased  sustained attention;Agitated (easily agitated when asked  questions) Oral Cavity - Dentition: Dentures, not available (dentures left  in pt's room) Oral Motor / Sensory Function: Impaired - see Bedside swallow  eval Self-Feeding Abilities: Needs assist Patient Positioning: Upright in chair Baseline Vocal Quality: Clear Volitional Swallow: Unable to elicit (pt states she didn't have  anything to swallow) Anatomy:  Within functional limits Pharyngeal Secretions: Not observed secondary MBS    Reason for Referral Objectively evaluate swallowing function   Oral Phase Oral Preparation/Oral Phase Oral Phase: Impaired Oral - Nectar Oral - Nectar Teaspoon:  (decr oral control results in premature  spillage ) Oral - Nectar Cup:  (decr oral control results in premature  spillage) Oral - Thin Oral - Thin Teaspoon:  (decr oral control results in premature  spillage) Oral - Thin Cup:  (decr oral control results in premature  spillage) Oral - Thin Straw:  (decr oral control results in premature  spillage) Oral - Solids Oral - Puree: Weak lingual manipulation;Delayed oral  transit;Piecemeal swallowing;Lingual/palatal residue Oral - Regular: Piecemeal swallowing;Weak lingual  manipulation;Delayed oral transit;Impaired  mastication;Lingual/palatal residue Oral Phase  - Comment Oral Phase - Comment: decr oral control results in premature  spillage of all liquids, pt did not have dentures present for  exam- cracker was softened with thin barium and pudding   Pharyngeal Phase Pharyngeal Phase Pharyngeal Phase: Impaired Pharyngeal - Nectar Pharyngeal - Nectar Teaspoon: Premature spillage to valleculae Pharyngeal - Nectar Cup: Delayed swallow initiation;Premature  spillage to pyriform sinuses Pharyngeal - Thin Pharyngeal - Thin Teaspoon: Premature spillage to pyriform  sinuses;Penetration/Aspiration during swallow Penetration/Aspiration details (thin teaspoon): Material enters  airway, remains ABOVE vocal cords then ejected out Pharyngeal - Thin Cup: Premature spillage to pyriform  sinuses;Penetration/Aspiration during swallow Penetration/Aspiration details (thin cup): Material enters  airway, CONTACTS cords and not ejected out (cued throat clear  removed trace amount of penetration) Pharyngeal - Thin Straw: Premature spillage to pyriform sinuses Pharyngeal - Solids Pharyngeal - Puree: Delayed swallow initiation;Premature spillage  to pyriform sinuses (retention of barium at vallecular space  before swlalow) Pharyngeal - Regular: Premature spillage to valleculae;Delayed  swallow initiation (retention of cracker at vallecular space  before swallow)  Cervical Esophageal Phase    GO    Cervical Esophageal Phase Cervical Esophageal Phase: Impaired Cervical Esophageal Phase - Comment Cervical Esophageal Comment: appearance of prominent  cricopharyngeus with retention of secretions x1, did not impact  barium flow, appearance of stasis with distal narrowing and  appearance of dilated esophagus above narrowed region, retrograde  propulsion of barium appeared WITHOUT pt awareness          Donavan Burnet, MS [facility] SLP [phone]       Recent Results (from the past 720 hour(s))  CULTURE, BLOOD (ROUTINE X 2)     Status: Normal (Preliminary result)   Collection Time   02/14/12 10:35 AM       Component Value Range Status Comment   Specimen Description BLOOD RIGHT HAND   Final    Special Requests BOTTLES DRAWN AEROBIC ONLY   Final    Culture  Setup Time 02/14/2012 14:49   Final    Culture     Final    Value: STAPHYLOCOCCUS AUREUS     Note: RIFAMPIN AND GENTAMICIN SHOULD NOT BE USED AS SINGLE DRUGS FOR TREATMENT OF STAPH INFECTIONS.     Note: Gram Stain Report Called to,Read Back By and Verified With: JESSICA CONRAD 02/15/12 1440 BY SMITHERSJ   Report Status PENDING   Incomplete   CULTURE, BLOOD (ROUTINE X 2)     Status: Normal (Preliminary result)   Collection Time   02/14/12 10:45 AM      Component Value Range Status Comment   Specimen Description BLOOD RIGHT FOREARM   Final    Special Requests BOTTLES DRAWN AEROBIC ONLY   Final  Culture  Setup Time 02/14/2012 14:49   Final    Culture     Final    Value: STAPHYLOCOCCUS AUREUS     Note: RIFAMPIN AND GENTAMICIN SHOULD NOT BE USED AS SINGLE DRUGS FOR TREATMENT OF STAPH INFECTIONS.     Note: CRITICAL RESULT CALLED TO, READ BACK BY AND VERIFIED WITH: MILLIE PRICE 02/16/12 @ 10:55AM BY RUSCA.   Report Status PENDING   Incomplete   URINE CULTURE     Status: Normal   Collection Time   02/14/12 10:46 AM      Component Value Range Status Comment   Specimen Description URINE, CATHETERIZED   Final    Special Requests NONE   Final    Culture  Setup Time 02/14/2012 18:58   Final    Colony Count NO GROWTH   Final    Culture NO GROWTH   Final    Report Status 02/15/2012 FINAL   Final   MRSA PCR SCREENING     Status: Normal   Collection Time   02/14/12  3:08 PM      Component Value Range Status Comment   MRSA by PCR NEGATIVE  NEGATIVE Final      Impression/Recommendation 72 year old diabetic with staph Aureus bacteremia likely from necrotic toe  #1  Staphylococcus aureus bacteremia (SAB) is associated with a high rate of complications and mortality.  Specific aspects of clinical management are critical to optimizing the  outcome of patients with SAB.  Therefore, the [facility] Antimicrobial Management Team ([facility]) have initiated an intervention aimed at improving the management of SAB at [facility].  To do so, Infectious Diseases Consultants are providing evidence-based recommendations for the management of all patients with SAB.  The specific recommendations for this patient are marked "X" in this document.  Recommended Arly.Keller  ]  Completed [02/16/11]  Perform follow-up blood cultures (even if the patient is afebrile) to ensure clearance of bacteremia.  Recommended [  ]  Completed [date]   Remove vascular catheter, and obtain follow-up cultures after removal of catheter NA  Recommended Arly.Keller  ]  Completed [date]   Perform echocardiography to evaluate for endocarditis (transthoracic ECHO is 40-50% sensitive, TEE is > 90% sensitive).*  Please keep in mind, that neither test can definitively EXCLUDE endocarditis, and that should clinical suspicion remain high for endocarditis the patient should then still be treated with an "endocarditis" duration of therapy = 6 weeks  Recommended [  ]  Completed [date]   Consult electrophysiologist to evaluate implanted cardiac device (pacemaker, ICD) NA  Recommended [ X ]  Completed [date]   Ensure source control.  Have all abscesses been drained effectively? Have deep seeded infections (septic joints or osteomyelitis) had appropriate surgical debridement?  NO  I think she is going to need surgical attention and likely amputation of her necrotic toe.  I will get mRI and plain films of this foot  She will need Orthopedic consult   Recommended [x  ]  Completed [date]   Investigate for "metastatic" sites of infection.   Does the patient have ANY symptom or physical exam finding that would suggest a deeper infection (back or neck pain that may be suggestive of vertebral osteomyelitis or epidural abscess, muscle pain that could be a symptom of pyomyositis)?  Keep in mind that for  deep seeded infections MRI imaging with contrast is preferred rather than other often insensitive tests such as plain x-rays, especially early in a patient's presentation.   See above re  necrotic toe  Recommended [ x ]   Continue vancomycin plus ancef pending ID of organism  [ x ]  Estimated duration of IV  Antibiotic therapy:  6-8 weeks  [x  ]  Consult case management for probable prolonged outpatient intravenous antibiotic therapy.   #2 Necrotic toe:  She needs an orthopedic consult for surgery likely an amputation  IN the interim I will get plain films and an MRI    Thank you so much for this interesting consult  [facility] [phone] (pager) [phone] (office) 02/16/2012, 8:09 PM  Paulette Blanch Dam 02/16/2012, 8:09 PM

## 2012-02-16 NOTE — Progress Notes (Addendum)
[facility] PROGRESS NOTE  [name] YQM:[id number] DOB: 1940-11-30 DOA: 02/14/2012 PCP: Jodie Echevaria   Brief narrative 72 year old female resident of skilled nursing facility with history of diabetes, hypertension, blindness both eyes, history of TIA and history of dysphagia? Esophageal dilatation done in 2001 was brought to  the ED with hypoxia significant leukocytosis. Chest x-ray showed bilateral airspace disease concerning for pneumonia.  Assessment/Plan: #1 healthcare associated pneumonia Given significant hypoxia , tachycardia and leukocytosis patient was admitted to step down monitoring. Patient started on empiric antibiotic  IV vancomycin and IV for cefazolin. Blood cultures sent on admission growing gram-positive cocci on both bottles. ID consult has been notified.We'll get repeat blood culture. -patient remains afebrile. Leukocytosis slowly improving. Will switch cefazolin to IV cefepime and IV Levaquin as well  for broader coverage. Will transfer her out to medical floor. -Follow WBC and temperature.  -Flu negative  Acute respiratory failure In the setting of pneumonia. O2 sats currently stable  Diabetes mellitus Continue sliding scale insulin and Lantus.  Hypertension Currently stable. Continue home medications  Dysphagia Seen by speech and swallow and MBS done today which showed esophageal dysphasia. Patient informs having esophagial dilatation done in 2001 by Dr. Arlyce Dice. I have requested [facility] for a consult. Patient currently on thin liquids.  Chronic kidney disease Mildly elevated  creatinine noted in a.m. labs. Patient also had poor urine output overnight. We'll check bladder scan. UA on admission did suggest UTI however culture has been negative  Code Status:full Code Family Communication: none at bedside Disposition Plan: back to SNF once stable   Consultants:  [facility] consulted  Procedures:  Modified barium swallow (  1/18)  Antibiotics:  IV vanco , zosyn and Levaquin ( 1/17>>)  HPI/Subjective: Feels better overall. Noted for poor urine output overnight. afebrile  Objective: Filed Vitals:   02/16/12 0400 02/16/12 0800 02/16/12 0852 02/16/12 1200  BP: 112/59 125/59    Pulse: 83 58    Temp: 98.1 F (36.7 C) 98.2 F (36.8 C)  97.6 F (36.4 C)  TempSrc: Oral Oral    Resp: 18 17    Height:      Weight:      SpO2: 99% 99% 96%     Intake/Output Summary (Last 24 hours) at 02/16/12 1336 Last data filed at 02/16/12 1121  Gross per 24 hour  Intake  847.5 ml  Output    375 ml  Net  472.5 ml   Filed Weights   02/14/12 1500 02/15/12 0400 02/16/12 0300  Weight: 88.6 kg (195 lb 5.2 oz) 89.6 kg (197 lb 8.5 oz) 90.1 kg (198 lb 10.2 oz)    Exam:   General:  Elderly female lying in bed in NAD  Cardiovascular: N S1&S2, no murmurs  Respiratory: clear b/l scattered rhonchi  Abdomen: soft, NT, ND BS+  Ext: warm, no edema  CNS: AAOX3, bilateral blindness  Data Reviewed: Basic Metabolic Panel:  Lab 02/16/12 5284 02/15/12 0345 02/14/12 1045  NA 131* 135 138  K 4.0 3.4* 4.6  CL 94* 98 97  CO2 23 24 22   GLUCOSE 203* 160* 96  BUN 74* 72* 71*  CREATININE 1.73* 1.48* 1.48*  CALCIUM 8.8 9.0 9.1  MG -- -- --  PHOS -- -- --   Liver Function Tests:  Lab 02/14/12 1045  AST 40*  ALT 24  ALKPHOS 174*  BILITOT 0.8  PROT 7.8  ALBUMIN 2.4*   No results found for this basename: LIPASE:5,AMYLASE:5 in the last 168 hours  Lab 02/14/12 1045  AMMONIA 32   CBC:  Lab 02/16/12 0629 02/15/12 0345 02/14/12 1045  WBC 19.9* 21.2* 26.9*  NEUTROABS -- -- 24.7*  HGB 10.4* 10.3* 10.4*  HCT 32.0* 31.1* 32.2*  MCV 83.8 83.8 86.1  PLT 343 374 385   Cardiac Enzymes:  Lab 02/14/12 1045  CKTOTAL --  CKMB --  CKMBINDEX --  TROPONINI <0.30   BNP (last 3 results)  Basename 02/14/12 1045 06/23/11 0900  PROBNP 7013.0* 1570.0*   CBG:  Lab 02/16/12 0800 02/15/12 2304 02/15/12 1700 02/15/12 1222  02/15/12 0748  GLUCAP 234* 182* 170* 109* 113*    Recent Results (from the past 240 hour(s))  CULTURE, BLOOD (ROUTINE X 2)     Status: Normal (Preliminary result)   Collection Time   02/14/12 10:35 AM      Component Value Range Status Comment   Specimen Description BLOOD RIGHT HAND   Final    Special Requests BOTTLES DRAWN AEROBIC ONLY   Final    Culture  Setup Time 02/14/2012 14:49   Final    Culture     Final    Value: STAPHYLOCOCCUS AUREUS     Note: RIFAMPIN AND GENTAMICIN SHOULD NOT BE USED AS SINGLE DRUGS FOR TREATMENT OF STAPH INFECTIONS.     Note: Gram Stain Report Called to,Read Back By and Verified With: JESSICA CONRAD 02/15/12 1440 BY SMITHERSJ   Report Status PENDING   Incomplete   CULTURE, BLOOD (ROUTINE X 2)     Status: Normal (Preliminary result)   Collection Time   02/14/12 10:45 AM      Component Value Range Status Comment   Specimen Description BLOOD RIGHT FOREARM   Final    Special Requests BOTTLES DRAWN AEROBIC ONLY   Final    Culture  Setup Time 02/14/2012 14:49   Final    Culture     Final    Value: STAPHYLOCOCCUS AUREUS     Note: RIFAMPIN AND GENTAMICIN SHOULD NOT BE USED AS SINGLE DRUGS FOR TREATMENT OF STAPH INFECTIONS.     Note: CRITICAL RESULT CALLED TO, READ BACK BY AND VERIFIED WITH: MILLIE PRICE 02/16/12 @ 10:55AM BY RUSCA.   Report Status PENDING   Incomplete   URINE CULTURE     Status: Normal   Collection Time   02/14/12 10:46 AM      Component Value Range Status Comment   Specimen Description URINE, CATHETERIZED   Final    Special Requests NONE   Final    Culture  Setup Time 02/14/2012 18:58   Final    Colony Count NO GROWTH   Final    Culture NO GROWTH   Final    Report Status 02/15/2012 FINAL   Final   MRSA PCR SCREENING     Status: Normal   Collection Time   02/14/12  3:08 PM      Component Value Range Status Comment   MRSA by PCR NEGATIVE  NEGATIVE Final      Studies: Portable Chest 1 View  02/15/2012  *RADIOLOGY REPORT*  Clinical  Data: CHF.  PORTABLE CHEST - 1 VIEW  Comparison: 02/14/2012  Findings: Shallow inspiration.  Cardiac enlargement with pulmonary vascular congestion and perihilar infiltration suggesting edema or pneumonia.  The infiltration is slightly more prominent than previous study.  No pneumothorax or effusion.  IMPRESSION: Cardiac enlargement with pulmonary vascular congestion.  Increasing perihilar infiltrates or edema bilaterally.   Original Report Authenticated By: Burman Nieves, M.D.    Dg  Swallowing Func-speech Pathology  02/16/2012  Chales Abrahams, CCC-SLP     02/16/2012 10:47 AM Objective Swallowing Evaluation: Modified Barium Swallowing Study   Patient Details  Name: [name] MRN: [id number] Date of Birth: 12-31-1940  Today's Date: 02/16/2012 Time: 0950-1015 SLP Time Calculation (min): 25 min  Past Medical History:  Past Medical History  Diagnosis Date  . Hypertension   . Diabetes mellitus   . Blindness   . TIA (transient ischemic attack)   . Anemia   . GERD (gastroesophageal reflux disease)   . Renal disorder   . Constipation    Past Surgical History:  Past Surgical History  Procedure Date  . Gallbladder surgery   . Esophageal dilation   . Cholecystectomy    HPI:  72 yo female adm to [facility] from [facility] SNF with  hypoxia, CXR + fluid, +/- pna- diagnosed with HCAP, ? CHF (await  BNP).  Per MD notes, pt told MD that she "always has pna."   PMH  + for TIA, GERD, anemia, constipation, HTN, blindness/HOH. SLP  phoned SNF SLP who reports she has not seen pt, she has however  seen the pt's roommate during meals and has not noted pt coughing  during meals.  Per SNF SLP, pt was recently "flagged for weight  loss" .  Esophageal dilatation completed in 03/1999 and pt states  she had problems swallowing before surgery that resolved after.    BSE completed Thursday pm and follow up Friday with  recommendation for MBS due to inability to rule out  dysphagia/aspiration at bedside with pt overtly coughing.      Assessment / Plan / Recommendation Clinical Impression  Dysphagia Diagnosis: Suspected primary esophageal dysphagia;Mild  oral phase dysphagia;Mild pharyngeal phase dysphagia   Clinical impression: Pt presents with mild oropharyngeal and  suspected primary esophageal dysphagia.  Decreased oral control  results in premature spillage of liquids into pharynx and  piecemeal deglutition with solids/pudding as well as oral stasis  with poor awareness.  Cued swallow facilitated timeliness of  swallow.  Pharyngeal dysphagia with delayed swallow initiation to  pyriform sinus with liquids, to vallecular space with  solid/pudding and  resultant trace silent penetration of liquids.   Cued throat clear removed trace penetrates but pt requiried  verbal cues x4 to complete and became agitated.  Pt prematurely  spills pudding/cracker filling entire vallecular space for longer  than 30 seconds as she continued to orally manipulate remainder  of bolus before she actually triggers a swallow.  No aspiration  or penetration with pudding/cracker noted.  Pt did not arrive to  test with her dentures unfortunately, therefore cracker was  softened.       Suspect esophageal deficits significantly contribute to  oropharyngeal deficits.  Appearance of prominent cricopharyngeus  with retention of secretions x1, CP did not impact barium flow.   However pt did appear with stasis from distal to mid esophagus  with distal narrowing.  Above area of narrowed region, esophagus  appeared dilated (? Motility issues).  Retrograde propulsion of  barium occurred WITHOUT pt awareness.   Suspect pt's primary  dysphagia is esophageal.    Pt was instructed to compensatory strategies, testing findings  and recommendations during the procedure. Pt may benefit from GI  referral for esophageal dysphagia management (h/o esophageal  dilatation in 2001).  In the interim time, rec liquid diet in  small amounts for maximal airway protection and medications be  crushed  given with liquids if not contraindicated.  SLP to  follow.  Thanks.       Treatment Recommendation  Therapy as outlined in treatment plan below    Diet Recommendation Thin liquid (All Liquids)   Liquid Administration via: Cup;Straw Medication Administration: Other (Comment) (crush with liquids) Supervision: Full supervision/cueing for compensatory strategies Compensations: Small sips/bites;Slow rate;Clear throat  intermittently Postural Changes and/or Swallow Maneuvers: Seated upright 90  degrees;Upright 30-60 min after meal    Other  Recommendations Recommended Consults: Consider GI  evaluation Oral Care Recommendations: Oral care BID   Follow Up Recommendations       Frequency and Duration min 2x/week  1 week   Pertinent Vitals/Pain     SLP Swallow Goals Patient will utilize recommended strategies during swallow to  increase swallowing safety with: Total assistance   General HPI: 72 yo female adm to [facility] from [facility]  SNF with hypoxia, CXR + fluid, +/- pna- diagnosed with HCAP, ?  CHF (await BNP).  Per MD notes, pt told MD that she "always has  pna."   PMH + for TIA, GERD, anemia, constipation, HTN,  blindness/HOH. SLP phoned SNF SLP who reports she has not seen  pt, she has however seen the pt's roommate during meals and has  not noted pt coughing during meals.  Per SNF SLP, pt was recently  "flagged for weight loss" .  Esophageal dilatation completed in  03/1999 and pt states she had problems swallowing before surgery  that resolved after.   BSE completed Thursday pm and follow up  Friday with recommendation for MBS due to inability to rule out  dysphagia/aspiration at bedside with pt overtly coughing. Type of Study: Modified Barium Swallowing Study Reason for Referral: Objectively evaluate swallowing function Diet Prior to this Study: Nectar-thick liquids;Regular Temperature Spikes Noted: No Respiratory Status: Supplemental O2 delivered via (comment) Behavior/Cognition: Alert;Cooperative;Requires  cueing;Decreased  sustained attention;Agitated (easily agitated when asked  questions) Oral Cavity - Dentition: Dentures, not available (dentures left  in pt's room) Oral Motor / Sensory Function: Impaired - see Bedside swallow  eval Self-Feeding Abilities: Needs assist Patient Positioning: Upright in chair Baseline Vocal Quality: Clear Volitional Swallow: Unable to elicit (pt states she didn't have  anything to swallow) Anatomy: Within functional limits Pharyngeal Secretions: Not observed secondary MBS    Reason for Referral Objectively evaluate swallowing function   Oral Phase Oral Preparation/Oral Phase Oral Phase: Impaired Oral - Nectar Oral - Nectar Teaspoon:  (decr oral control results in premature  spillage ) Oral - Nectar Cup:  (decr oral control results in premature  spillage) Oral - Thin Oral - Thin Teaspoon:  (decr oral control results in premature  spillage) Oral - Thin Cup:  (decr oral control results in premature  spillage) Oral - Thin Straw:  (decr oral control results in premature  spillage) Oral - Solids Oral - Puree: Weak lingual manipulation;Delayed oral  transit;Piecemeal swallowing;Lingual/palatal residue Oral - Regular: Piecemeal swallowing;Weak lingual  manipulation;Delayed oral transit;Impaired  mastication;Lingual/palatal residue Oral Phase - Comment Oral Phase - Comment: decr oral control results in premature  spillage of all liquids, pt did not have dentures present for  exam- cracker was softened with thin barium and pudding   Pharyngeal Phase Pharyngeal Phase Pharyngeal Phase: Impaired Pharyngeal - Nectar Pharyngeal - Nectar Teaspoon: Premature spillage to valleculae Pharyngeal - Nectar Cup: Delayed swallow initiation;Premature  spillage to pyriform sinuses Pharyngeal - Thin Pharyngeal - Thin Teaspoon: Premature spillage to pyriform  sinuses;Penetration/Aspiration during swallow Penetration/Aspiration details (thin teaspoon): Material enters  airway, remains  ABOVE vocal cords then  ejected out Pharyngeal - Thin Cup: Premature spillage to pyriform  sinuses;Penetration/Aspiration during swallow Penetration/Aspiration details (thin cup): Material enters  airway, CONTACTS cords and not ejected out (cued throat clear  removed trace amount of penetration) Pharyngeal - Thin Straw: Premature spillage to pyriform sinuses Pharyngeal - Solids Pharyngeal - Puree: Delayed swallow initiation;Premature spillage  to pyriform sinuses (retention of barium at vallecular space  before swlalow) Pharyngeal - Regular: Premature spillage to valleculae;Delayed  swallow initiation (retention of cracker at vallecular space  before swallow)  Cervical Esophageal Phase    GO    Cervical Esophageal Phase Cervical Esophageal Phase: Impaired Cervical Esophageal Phase - Comment Cervical Esophageal Comment: appearance of prominent  cricopharyngeus with retention of secretions x1, did not impact  barium flow, appearance of stasis with distal narrowing and  appearance of dilated esophagus above narrowed region, retrograde  propulsion of barium appeared WITHOUT pt awareness          Donavan Burnet, MS [facility] SLP [phone]      Scheduled Meds:   . albuterol  2.5 mg Nebulization Once  . albuterol  2.5 mg Nebulization Q6H  . antiseptic oral rinse  15 mL Mouth Rinse q12n4p  . aspirin EC  325 mg Oral Daily  . brimonidine  1 drop Left Eye BID  . brinzolamide  1 drop Left Eye BID  . carvedilol  3.125 mg Oral Q breakfast  . chlorhexidine  15 mL Mouth/Throat BID  . docusate sodium  100 mg Oral BID  . feeding supplement  30 mL Oral TID WC  . furosemide  20 mg Oral BID  . gabapentin  100 mg Oral QHS  . insulin aspart  0-15 Units Subcutaneous TID WC  . insulin aspart  0-5 Units Subcutaneous QHS  . insulin glargine  10 Units Subcutaneous QHS  . ipratropium  0.5 mg Nebulization Q6H  . metoCLOPramide  5 mg Oral TID WC  . multivitamin with minerals  1 tablet Oral Daily  . piperacillin-tazobactam (ZOSYN)  IV  3.375 g Intravenous  Q8H  . sodium chloride  3 mL Intravenous Q12H  . sodium chloride  3 mL Intravenous Q12H  . vancomycin  1,250 mg Intravenous Q24H      Time spent: 25 MINUTES    Keiton Cosma  [facility] Pager [phone]. If 8PM-8AM, please contact night-coverage at [email or web address], password [facility] 02/16/2012, 1:36 PM  LOS: 2 days

## 2012-02-16 NOTE — Progress Notes (Signed)
[facility] confirmed positive blood culture for aerobic staph aureus.

## 2012-02-16 NOTE — Consult Note (Signed)
[facility] Consult Note  Referring Provider: No ref. provider found Primary Care Physician:  No primary provider on file. Primary Gastroenterologist:  Dr.  Antony Contras Complaint: Consult for abnormal modified barium swallow HPI: [name] is an 72 y.o. blackfemale  who was admitted with pneumonia who has apparently been witnessed to be having some swallowing difficulty although she denies any whatsoever. At any rate she had a modified barium swallow which suggested retention in the esophagus and possibly a dilated esophagus with some retrograde movement of liquids. GI consult was advised. The patient has had an esophageal limitation in 2001. The report is not available. Again she denies any dysphagia for pills liquids or solids but the nurses are not sure that she is reliable.  Past Medical History  Diagnosis Date  . Hypertension   . Diabetes mellitus   . Blindness   . TIA (transient ischemic attack)   . Anemia   . GERD (gastroesophageal reflux disease)   . Renal disorder   . Constipation     Past Surgical History  Procedure Date  . Gallbladder surgery   . Esophageal dilation   . Cholecystectomy     Medications Prior to Admission  Medication Sig Dispense Refill  . acetaminophen (TYLENOL) 325 MG tablet Take 2 tablets (650 mg total) by mouth every 6 (six) hours as needed (or Fever >/= 101).      Marland Kitchen albuterol (PROVENTIL) (5 MG/ML) 0.5% nebulizer solution Take 0.5 mLs (2.5 mg total) by nebulization every 4 (four) hours as needed for wheezing.  20 mL    . aspirin EC 325 MG tablet Take 325 mg by mouth daily.        Marland Kitchen azithromycin (ZITHROMAX Z-PAK) 250 MG tablet Take 250-500 mg by mouth daily. 500 mg on first dose. 250 mg every day for 4 days, started on 02-13-12      . Brinzolamide-Brimonidine (SIMBRINZA) 1-0.2 % SUSP Place 1 drop into the left eye 2 (two) times daily.      . carvedilol (COREG) 3.125 MG tablet Take 1 tablet (3.125 mg total) by mouth daily with breakfast.        . cefTRIAXone (ROCEPHIN) 1 G injection Inject 1 g into the muscle daily. Pt's therapy is for 7 days. Started 02-13-12      . docusate sodium (COLACE) 100 MG capsule Take 100 mg by mouth 2 (two) times daily.      . feeding supplement (PRO-STAT SUGAR FREE 64) LIQD Take 30 mLs by mouth 3 (three) times daily with meals.      . furosemide (LASIX) 20 MG tablet Take 20 mg by mouth 2 (two) times daily.      Marland Kitchen gabapentin (NEURONTIN) 100 MG capsule Take 100 mg by mouth at bedtime.      Marland Kitchen HYDROcodone-acetaminophen (NORCO/VICODIN) 5-325 MG per tablet Take 1 tablet by mouth 3 (three) times daily as needed. pain      . insulin aspart (NOVOLOG) 100 UNIT/ML injection Inject 0-9 Units into the skin 3 (three) times daily with meals. CBG < 70: implement hypoglycemia protocol CBG 70 - 120: 0 units CBG 121 - 150: 1 unit CBG 151 - 200: 2 units CBG 201 - 250: 3 units CBG 251 - 300: 5 units CBG 301 - 350: 7 units CBG 351 - 400: 9 units CBG > 400 call MD.  1 vial    . insulin glargine (LANTUS) 100 UNIT/ML injection Inject 10 Units into the skin at bedtime.      Marland Kitchen ipratropium (  ATROVENT) 0.02 % nebulizer solution Take 2.5 mLs (0.5 mg total) by nebulization every 4 (four) hours as needed.  75 mL    . ipratropium-albuterol (DUONEB) 0.5-2.5 (3) MG/3ML SOLN Take 3 mLs by nebulization every 6 (six) hours as needed. wheezing      . metoCLOPramide (REGLAN) 5 MG tablet Take 5 mg by mouth 3 (three) times daily.      . Multiple Vitamins-Minerals (MULTIVITAMIN WITH MINERALS) tablet Take 1 tablet by mouth daily.      . naproxen sodium (ALEVE) 220 MG tablet Take 220 mg by mouth 2 (two) times daily with a meal.      . sodium chloride (OCEAN) 0.65 % SOLN nasal spray Place 1 spray into the nose 4 (four) times daily as needed for congestion.        Allergies: No Known Allergies  History reviewed. No pertinent family history.  Social History:  reports that she has never smoked. Her smokeless tobacco use includes Snuff. She reports  that she does not drink alcohol or use illicit drugs.  Review of Systems: negative except as above   Blood pressure 138/55, pulse 65, temperature 97.6 F (36.4 C), temperature source Oral, resp. rate 14, height 5\' 2"  (1.575 m), weight 90.1 kg (198 lb 10.2 oz), SpO2 96.00%. Head: Normocephalic, without obvious abnormality, atraumatic Neck: no adenopathy, no carotid bruit, no JVD, supple, symmetrical, trachea midline and thyroid not enlarged, symmetric, no tenderness/mass/nodules Resp: clear to auscultation bilaterally Cardio: regular rate and rhythm, S1, S2 normal, no murmur, click, rub or gallop GI: Abdomen soft nondistended with normoactive bowel sounds. No hepatomegaly masses or guarding Extremities: extremities normal, atraumatic, no cyanosis or edema  Results for orders placed during the hospital encounter of 02/14/12 (from the past 48 hour(s))  GLUCOSE, CAPILLARY     Status: Abnormal   Collection Time   02/14/12  4:51 PM      Component Value Range Comment   Glucose-Capillary 111 (*) 70 - 99 mg/dL    Comment 1 Documented in Chart      Comment 2 Notify RN     OCCULT BLOOD X 1 CARD TO LAB, STOOL     Status: Normal   Collection Time   02/14/12 10:05 PM      Component Value Range Comment   Fecal Occult Bld NEGATIVE  NEGATIVE   GLUCOSE, CAPILLARY     Status: Abnormal   Collection Time   02/14/12 10:10 PM      Component Value Range Comment   Glucose-Capillary 211 (*) 70 - 99 mg/dL   BASIC METABOLIC PANEL     Status: Abnormal   Collection Time   02/15/12  3:45 AM      Component Value Range Comment   Sodium 135  135 - 145 mEq/L    Potassium 3.4 (*) 3.5 - 5.1 mEq/L    Chloride 98  96 - 112 mEq/L    CO2 24  19 - 32 mEq/L    Glucose, Bld 160 (*) 70 - 99 mg/dL    BUN 72 (*) 6 - 23 mg/dL    Creatinine, Ser 1.61 (*) 0.50 - 1.10 mg/dL    Calcium 9.0  8.4 - 09.6 mg/dL    GFR calc non Af Amer 35 (*) >90 mL/min    GFR calc Af Amer 41 (*) >90 mL/min   CBC     Status: Abnormal   Collection  Time   02/15/12  3:45 AM      Component Value Range Comment  WBC 21.2 (*) 4.0 - 10.5 K/uL    RBC 3.71 (*) 3.87 - 5.11 MIL/uL    Hemoglobin 10.3 (*) 12.0 - 15.0 g/dL    HCT 14.7 (*) 82.9 - 46.0 %    MCV 83.8  78.0 - 100.0 fL    MCH 27.8  26.0 - 34.0 pg    MCHC 33.1  30.0 - 36.0 g/dL    RDW 56.2  13.0 - 86.5 %    Platelets 374  150 - 400 K/uL   GLUCOSE, CAPILLARY     Status: Abnormal   Collection Time   02/15/12  7:48 AM      Component Value Range Comment   Glucose-Capillary 113 (*) 70 - 99 mg/dL    Comment 1 Documented in Chart      Comment 2 Notify RN     BLOOD GAS, ARTERIAL     Status: Abnormal   Collection Time   02/15/12  7:50 AM      Component Value Range Comment   O2 Content 2.0      Delivery systems NASAL CANNULA      pH, Arterial 7.370  7.350 - 7.450    pCO2 arterial 44.3  35.0 - 45.0 mmHg    pO2, Arterial 76.4 (*) 80.0 - 100.0 mmHg    Bicarbonate 25.0 (*) 20.0 - 24.0 mEq/L    TCO2 22.7  0 - 100 mmol/L    Acid-Base Excess 0.1  0.0 - 2.0 mmol/L    O2 Saturation 95.0      Patient temperature 98.6      Collection site RIGHT RADIAL      Drawn by [phone]      Sample type ARTERIAL DRAW      Allens test (pass/fail) PASS  PASS   GLUCOSE, CAPILLARY     Status: Abnormal   Collection Time   02/15/12 12:22 PM      Component Value Range Comment   Glucose-Capillary 109 (*) 70 - 99 mg/dL    Comment 1 Documented in Chart      Comment 2 Notify RN     GLUCOSE, CAPILLARY     Status: Abnormal   Collection Time   02/15/12  5:00 PM      Component Value Range Comment   Glucose-Capillary 170 (*) 70 - 99 mg/dL   GLUCOSE, CAPILLARY     Status: Abnormal   Collection Time   02/15/12 11:04 PM      Component Value Range Comment   Glucose-Capillary 182 (*) 70 - 99 mg/dL   CBC     Status: Abnormal   Collection Time   02/16/12  6:29 AM      Component Value Range Comment   WBC 19.9 (*) 4.0 - 10.5 K/uL    RBC 3.82 (*) 3.87 - 5.11 MIL/uL    Hemoglobin 10.4 (*) 12.0 - 15.0 g/dL    HCT 29.5  (*) 28.4 - 46.0 %    MCV 83.8  78.0 - 100.0 fL    MCH 27.2  26.0 - 34.0 pg    MCHC 32.5  30.0 - 36.0 g/dL    RDW 13.2  44.0 - 10.2 %    Platelets 343  150 - 400 K/uL   PROCALCITONIN     Status: Normal   Collection Time   02/16/12  6:58 AM      Component Value Range Comment   Procalcitonin 1.69     BASIC METABOLIC PANEL     Status: Abnormal  Collection Time   02/16/12  6:58 AM      Component Value Range Comment   Sodium 131 (*) 135 - 145 mEq/L    Potassium 4.0  3.5 - 5.1 mEq/L    Chloride 94 (*) 96 - 112 mEq/L    CO2 23  19 - 32 mEq/L    Glucose, Bld 203 (*) 70 - 99 mg/dL    BUN 74 (*) 6 - 23 mg/dL    Creatinine, Ser 5.28 (*) 0.50 - 1.10 mg/dL    Calcium 8.8  8.4 - 41.3 mg/dL    GFR calc non Af Amer 29 (*) >90 mL/min    GFR calc Af Amer 34 (*) >90 mL/min   GLUCOSE, CAPILLARY     Status: Abnormal   Collection Time   02/16/12  8:00 AM      Component Value Range Comment   Glucose-Capillary 234 (*) 70 - 99 mg/dL    Portable Chest 1 View  02/15/2012  *RADIOLOGY REPORT*  Clinical Data: CHF.  PORTABLE CHEST - 1 VIEW  Comparison: 02/14/2012  Findings: Shallow inspiration.  Cardiac enlargement with pulmonary vascular congestion and perihilar infiltration suggesting edema or pneumonia.  The infiltration is slightly more prominent than previous study.  No pneumothorax or effusion.  IMPRESSION: Cardiac enlargement with pulmonary vascular congestion.  Increasing perihilar infiltrates or edema bilaterally.   Original Report Authenticated By: Burman Nieves, M.D.    Dg Swallowing Func-speech Pathology  02/16/2012  Chales Abrahams, CCC-SLP     02/16/2012 10:47 AM Objective Swallowing Evaluation: Modified Barium Swallowing Study   Patient Details  Name: [name] MRN: [id number] Date of Birth: 1940-06-13  Today's Date: 02/16/2012 Time: 0950-1015 SLP Time Calculation (min): 25 min  Past Medical History:  Past Medical History  Diagnosis Date  . Hypertension   . Diabetes mellitus   . Blindness   .  TIA (transient ischemic attack)   . Anemia   . GERD (gastroesophageal reflux disease)   . Renal disorder   . Constipation    Past Surgical History:  Past Surgical History  Procedure Date  . Gallbladder surgery   . Esophageal dilation   . Cholecystectomy    HPI:  72 yo female adm to [facility] from [facility] with  hypoxia, CXR + fluid, +/- pna- diagnosed with HCAP, ? CHF (await  BNP).  Per MD notes, pt told MD that she "always has pna."   PMH  + for TIA, GERD, anemia, constipation, HTN, blindness/HOH. SLP  phoned SNF SLP who reports she has not seen pt, she has however  seen the pt's roommate during meals and has not noted pt coughing  during meals.  Per SNF SLP, pt was recently "flagged for weight  loss" .  Esophageal dilatation completed in 03/1999 and pt states  she had problems swallowing before surgery that resolved after.    BSE completed Thursday pm and follow up Friday with  recommendation for MBS due to inability to rule out  dysphagia/aspiration at bedside with pt overtly coughing.     Assessment / Plan / Recommendation Clinical Impression  Dysphagia Diagnosis: Suspected primary esophageal dysphagia;Mild  oral phase dysphagia;Mild pharyngeal phase dysphagia   Clinical impression: Pt presents with mild oropharyngeal and  suspected primary esophageal dysphagia.  Decreased oral control  results in premature spillage of liquids into pharynx and  piecemeal deglutition with solids/pudding as well as oral stasis  with poor awareness.  Cued swallow facilitated timeliness of  swallow.  Pharyngeal dysphagia  with delayed swallow initiation to  pyriform sinus with liquids, to vallecular space with  solid/pudding and  resultant trace silent penetration of liquids.   Cued throat clear removed trace penetrates but pt requiried  verbal cues x4 to complete and became agitated.  Pt prematurely  spills pudding/cracker filling entire vallecular space for longer  than 30 seconds as she continued to orally manipulate  remainder  of bolus before she actually triggers a swallow.  No aspiration  or penetration with pudding/cracker noted.  Pt did not arrive to  test with her dentures unfortunately, therefore cracker was  softened.       Suspect esophageal deficits significantly contribute to  oropharyngeal deficits.  Appearance of prominent cricopharyngeus  with retention of secretions x1, CP did not impact barium flow.   However pt did appear with stasis from distal to mid esophagus  with distal narrowing.  Above area of narrowed region, esophagus  appeared dilated (? Motility issues).  Retrograde propulsion of  barium occurred WITHOUT pt awareness.   Suspect pt's primary  dysphagia is esophageal.    Pt was instructed to compensatory strategies, testing findings  and recommendations during the procedure. Pt may benefit from GI  referral for esophageal dysphagia management (h/o esophageal  dilatation in 2001).  In the interim time, rec liquid diet in  small amounts for maximal airway protection and medications be  crushed given with liquids if not contraindicated.  SLP to  follow.  Thanks.       Treatment Recommendation  Therapy as outlined in treatment plan below    Diet Recommendation Thin liquid (All Liquids)   Liquid Administration via: Cup;Straw Medication Administration: Other (Comment) (crush with liquids) Supervision: Full supervision/cueing for compensatory strategies Compensations: Small sips/bites;Slow rate;Clear throat  intermittently Postural Changes and/or Swallow Maneuvers: Seated upright 90  degrees;Upright 30-60 min after meal    Other  Recommendations Recommended Consults: Consider GI  evaluation Oral Care Recommendations: Oral care BID   Follow Up Recommendations       Frequency and Duration min 2x/week  1 week   Pertinent Vitals/Pain     SLP Swallow Goals Patient will utilize recommended strategies during swallow to  increase swallowing safety with: Total assistance   General HPI: 71 yo female adm to [facility] from  [facility]  SNF with hypoxia, CXR + fluid, +/- pna- diagnosed with HCAP, ?  CHF (await BNP).  Per MD notes, pt told MD that she "always has  pna."   PMH + for TIA, GERD, anemia, constipation, HTN,  blindness/HOH. SLP phoned SNF SLP who reports she has not seen  pt, she has however seen the pt's roommate during meals and has  not noted pt coughing during meals.  Per SNF SLP, pt was recently  "flagged for weight loss" .  Esophageal dilatation completed in  03/1999 and pt states she had problems swallowing before surgery  that resolved after.   BSE completed Thursday pm and follow up  Friday with recommendation for MBS due to inability to rule out  dysphagia/aspiration at bedside with pt overtly coughing. Type of Study: Modified Barium Swallowing Study Reason for Referral: Objectively evaluate swallowing function Diet Prior to this Study: Nectar-thick liquids;Regular Temperature Spikes Noted: No Respiratory Status: Supplemental O2 delivered via (comment) Behavior/Cognition: Alert;Cooperative;Requires cueing;Decreased  sustained attention;Agitated (easily agitated when asked  questions) Oral Cavity - Dentition: Dentures, not available (dentures left  in pt's room) Oral Motor / Sensory Function: Impaired - see Bedside swallow  eval Self-Feeding Abilities:  Needs assist Patient Positioning: Upright in chair Baseline Vocal Quality: Clear Volitional Swallow: Unable to elicit (pt states she didn't have  anything to swallow) Anatomy: Within functional limits Pharyngeal Secretions: Not observed secondary MBS    Reason for Referral Objectively evaluate swallowing function   Oral Phase Oral Preparation/Oral Phase Oral Phase: Impaired Oral - Nectar Oral - Nectar Teaspoon:  (decr oral control results in premature  spillage ) Oral - Nectar Cup:  (decr oral control results in premature  spillage) Oral - Thin Oral - Thin Teaspoon:  (decr oral control results in premature  spillage) Oral - Thin Cup:  (decr oral control  results in premature  spillage) Oral - Thin Straw:  (decr oral control results in premature  spillage) Oral - Solids Oral - Puree: Weak lingual manipulation;Delayed oral  transit;Piecemeal swallowing;Lingual/palatal residue Oral - Regular: Piecemeal swallowing;Weak lingual  manipulation;Delayed oral transit;Impaired  mastication;Lingual/palatal residue Oral Phase - Comment Oral Phase - Comment: decr oral control results in premature  spillage of all liquids, pt did not have dentures present for  exam- cracker was softened with thin barium and pudding   Pharyngeal Phase Pharyngeal Phase Pharyngeal Phase: Impaired Pharyngeal - Nectar Pharyngeal - Nectar Teaspoon: Premature spillage to valleculae Pharyngeal - Nectar Cup: Delayed swallow initiation;Premature  spillage to pyriform sinuses Pharyngeal - Thin Pharyngeal - Thin Teaspoon: Premature spillage to pyriform  sinuses;Penetration/Aspiration during swallow Penetration/Aspiration details (thin teaspoon): Material enters  airway, remains ABOVE vocal cords then ejected out Pharyngeal - Thin Cup: Premature spillage to pyriform  sinuses;Penetration/Aspiration during swallow Penetration/Aspiration details (thin cup): Material enters  airway, CONTACTS cords and not ejected out (cued throat clear  removed trace amount of penetration) Pharyngeal - Thin Straw: Premature spillage to pyriform sinuses Pharyngeal - Solids Pharyngeal - Puree: Delayed swallow initiation;Premature spillage  to pyriform sinuses (retention of barium at vallecular space  before swlalow) Pharyngeal - Regular: Premature spillage to valleculae;Delayed  swallow initiation (retention of cracker at vallecular space  before swallow)  Cervical Esophageal Phase    GO    Cervical Esophageal Phase Cervical Esophageal Phase: Impaired Cervical Esophageal Phase - Comment Cervical Esophageal Comment: appearance of prominent  cricopharyngeus with retention of secretions x1, did not impact  barium flow, appearance of  stasis with distal narrowing and  appearance of dilated esophagus above narrowed region, retrograde  propulsion of barium appeared WITHOUT pt awareness          Donavan Burnet, MS [facility] SLP [phone]      Assessment: Abnormal barium swallow with presumably some swallowing dysfunction which the patient denies. Plan:  Will obtain standard barium swallow to assess for stricture versus dysmotility before proceeding with endoscopy in this situation. Rhonda Vangieson C 02/16/2012, 4:27 PM

## 2012-02-16 NOTE — Progress Notes (Signed)
Bladder scan revealed average 100CC.

## 2012-02-16 NOTE — Procedures (Signed)
Objective Swallowing Evaluation: Modified Barium Swallowing Study  Patient Details  Name: [name] MRN: [id number] Date of Birth: May 30, 1940  Today's Date: 02/16/2012 Time: 0950-1015 SLP Time Calculation (min): 25 min  Past Medical History:  Past Medical History  Diagnosis Date  . Hypertension   . Diabetes mellitus   . Blindness   . TIA (transient ischemic attack)   . Anemia   . GERD (gastroesophageal reflux disease)   . Renal disorder   . Constipation    Past Surgical History:  Past Surgical History  Procedure Date  . Gallbladder surgery   . Esophageal dilation   . Cholecystectomy    HPI:  72 yo female adm to [facility] from [facility] with hypoxia, CXR + fluid, +/- pna- diagnosed with HCAP, ? CHF (await BNP).  Per MD notes, pt told MD that she "always has pna."   PMH + for TIA, GERD, anemia, constipation, HTN, blindness/HOH. SLP phoned SNF SLP who reports she has not seen pt, she has however seen the pt's roommate during meals and has not noted pt coughing during meals.  Per SNF SLP, pt was recently "flagged for weight loss" .  Esophageal dilatation completed in 03/1999 and pt states she had problems swallowing before surgery that resolved after.   BSE completed Thursday pm and follow up Friday with recommendation for MBS due to inability to rule out dysphagia/aspiration at bedside with pt overtly coughing.     Assessment / Plan / Recommendation Clinical Impression  Dysphagia Diagnosis: Suspected primary esophageal dysphagia;Mild oral phase dysphagia;Mild pharyngeal phase dysphagia   Clinical impression: Pt presents with mild oropharyngeal and suspected primary esophageal dysphagia.  Decreased oral control results in premature spillage of liquids into pharynx and piecemeal deglutition with solids/pudding as well as oral stasis with poor awareness.  Cued swallow facilitated timeliness of swallow.  Pharyngeal dysphagia with delayed swallow initiation to pyriform sinus  with liquids, to vallecular space with solid/pudding and  resultant trace silent penetration of liquids.  Cued throat clear removed trace penetrates but pt requiried verbal cues x4 to complete and became agitated.  Pt prematurely spills pudding/cracker filling entire vallecular space for longer than 30 seconds as she continued to orally manipulate remainder of bolus before she actually triggers a swallow.  No aspiration or penetration with pudding/cracker noted.  Pt did not arrive to test with her dentures unfortunately, therefore cracker was softened.       Suspect esophageal deficits significantly contribute to oropharyngeal deficits.  Appearance of prominent cricopharyngeus with retention of secretions x1, CP did not impact barium flow.  However pt did appear with stasis from distal to mid esophagus with distal narrowing.  Above area of narrowed region, esophagus appeared dilated (? Motility issues).  Retrograde propulsion of barium occurred WITHOUT pt awareness.   Suspect pt's primary dysphagia is esophageal.    Pt was instructed to compensatory strategies, testing findings and recommendations during the procedure. Pt may benefit from GI referral for esophageal dysphagia management (h/o esophageal dilatation in 2001).  In the interim time, rec liquid diet in small amounts for maximal airway protection and medications be crushed given with liquids if not contraindicated.  SLP to follow.  Thanks.       Treatment Recommendation  Therapy as outlined in treatment plan below    Diet Recommendation Thin liquid (All Liquids)   Liquid Administration via: Cup;Straw Medication Administration: Other (Comment) (crush with liquids) Supervision: Full supervision/cueing for compensatory strategies Compensations: Small sips/bites;Slow rate;Clear throat intermittently Postural Changes  and/or Swallow Maneuvers: Seated upright 90 degrees;Upright 30-60 min after meal    Other  Recommendations Recommended Consults:  Consider GI evaluation Oral Care Recommendations: Oral care BID   Follow Up Recommendations       Frequency and Duration min 2x/week  1 week   Pertinent Vitals/Pain     SLP Swallow Goals Patient will utilize recommended strategies during swallow to increase swallowing safety with: Total assistance   General HPI: 72 yo female adm to [facility] from [facility] SNF with hypoxia, CXR + fluid, +/- pna- diagnosed with HCAP, ? CHF (await BNP).  Per MD notes, pt told MD that she "always has pna."   PMH + for TIA, GERD, anemia, constipation, HTN, blindness/HOH. SLP phoned SNF SLP who reports she has not seen pt, she has however seen the pt's roommate during meals and has not noted pt coughing during meals.  Per SNF SLP, pt was recently "flagged for weight loss" .  Esophageal dilatation completed in 03/1999 and pt states she had problems swallowing before surgery that resolved after.   BSE completed Thursday pm and follow up Friday with recommendation for MBS due to inability to rule out dysphagia/aspiration at bedside with pt overtly coughing. Type of Study: Modified Barium Swallowing Study Reason for Referral: Objectively evaluate swallowing function Diet Prior to this Study: Nectar-thick liquids;Regular Temperature Spikes Noted: No Respiratory Status: Supplemental O2 delivered via (comment) Behavior/Cognition: Alert;Cooperative;Requires cueing;Decreased sustained attention;Agitated (easily agitated when asked questions) Oral Cavity - Dentition: Dentures, not available (dentures left in pt's room) Oral Motor / Sensory Function: Impaired - see Bedside swallow eval Self-Feeding Abilities: Needs assist Patient Positioning: Upright in chair Baseline Vocal Quality: Clear Volitional Swallow: Unable to elicit (pt states she didn't have anything to swallow) Anatomy: Within functional limits Pharyngeal Secretions: Not observed secondary MBS    Reason for Referral Objectively evaluate swallowing  function   Oral Phase Oral Preparation/Oral Phase Oral Phase: Impaired Oral - Nectar Oral - Nectar Teaspoon:  (decr oral control results in premature spillage ) Oral - Nectar Cup:  (decr oral control results in premature spillage) Oral - Thin Oral - Thin Teaspoon:  (decr oral control results in premature spillage) Oral - Thin Cup:  (decr oral control results in premature spillage) Oral - Thin Straw:  (decr oral control results in premature spillage) Oral - Solids Oral - Puree: Weak lingual manipulation;Delayed oral transit;Piecemeal swallowing;Lingual/palatal residue Oral - Regular: Piecemeal swallowing;Weak lingual manipulation;Delayed oral transit;Impaired mastication;Lingual/palatal residue Oral Phase - Comment Oral Phase - Comment: decr oral control results in premature spillage of all liquids, pt did not have dentures present for exam- cracker was softened with thin barium and pudding   Pharyngeal Phase Pharyngeal Phase Pharyngeal Phase: Impaired Pharyngeal - Nectar Pharyngeal - Nectar Teaspoon: Premature spillage to valleculae Pharyngeal - Nectar Cup: Delayed swallow initiation;Premature spillage to pyriform sinuses Pharyngeal - Thin Pharyngeal - Thin Teaspoon: Premature spillage to pyriform sinuses;Penetration/Aspiration during swallow Penetration/Aspiration details (thin teaspoon): Material enters airway, remains ABOVE vocal cords then ejected out Pharyngeal - Thin Cup: Premature spillage to pyriform sinuses;Penetration/Aspiration during swallow Penetration/Aspiration details (thin cup): Material enters airway, CONTACTS cords and not ejected out (cued throat clear removed trace amount of penetration) Pharyngeal - Thin Straw: Premature spillage to pyriform sinuses Pharyngeal - Solids Pharyngeal - Puree: Delayed swallow initiation;Premature spillage to pyriform sinuses (retention of barium at vallecular space before swlalow) Pharyngeal - Regular: Premature spillage to  valleculae;Delayed swallow initiation (retention of cracker at vallecular space before swallow)  Cervical Esophageal Phase  GO    Cervical Esophageal Phase Cervical Esophageal Phase: Impaired Cervical Esophageal Phase - Comment Cervical Esophageal Comment: appearance of prominent cricopharyngeus with retention of secretions x1, did not impact barium flow, appearance of stasis with distal narrowing and appearance of dilated esophagus above narrowed region, retrograde propulsion of barium appeared WITHOUT pt awareness          Donavan Burnet, MS [facility] SLP [phone]

## 2012-02-17 ENCOUNTER — Inpatient Hospital Stay (HOSPITAL_COMMUNITY): Payer: Medicare Other

## 2012-02-17 ENCOUNTER — Other Ambulatory Visit: Payer: Self-pay | Admitting: Orthopedic Surgery

## 2012-02-17 DIAGNOSIS — R7881 Bacteremia: Secondary | ICD-10-CM | POA: Diagnosis present

## 2012-02-17 DIAGNOSIS — I96 Gangrene, not elsewhere classified: Secondary | ICD-10-CM | POA: Diagnosis present

## 2012-02-17 DIAGNOSIS — R34 Anuria and oliguria: Secondary | ICD-10-CM

## 2012-02-17 DIAGNOSIS — A4902 Methicillin resistant Staphylococcus aureus infection, unspecified site: Secondary | ICD-10-CM

## 2012-02-17 LAB — GLUCOSE, CAPILLARY
Glucose-Capillary: 128 mg/dL — ABNORMAL HIGH (ref 70–99)
Glucose-Capillary: 95 mg/dL (ref 70–99)

## 2012-02-17 LAB — C-REACTIVE PROTEIN: CRP: 16.6 mg/dL — ABNORMAL HIGH (ref <0.60)

## 2012-02-17 LAB — PRO B NATRIURETIC PEPTIDE: Pro B Natriuretic peptide (BNP): 14005 pg/mL — ABNORMAL HIGH (ref 0–125)

## 2012-02-17 LAB — CULTURE, BLOOD (ROUTINE X 2)

## 2012-02-17 MED ORDER — ALBUTEROL SULFATE (5 MG/ML) 0.5% IN NEBU
2.5000 mg | INHALATION_SOLUTION | Freq: Three times a day (TID) | RESPIRATORY_TRACT | Status: DC
  Administered 2012-02-18 – 2012-02-19 (×6): 2.5 mg via RESPIRATORY_TRACT
  Filled 2012-02-17 (×6): qty 0.5

## 2012-02-17 MED ORDER — SODIUM CHLORIDE 0.9 % IV SOLN: INTRAVENOUS | Status: DC

## 2012-02-17 MED ORDER — FUROSEMIDE 10 MG/ML IJ SOLN
40.0000 mg | Freq: Once | INTRAMUSCULAR | Status: DC
  Filled 2012-02-17: qty 4

## 2012-02-17 MED ORDER — FUROSEMIDE 40 MG PO TABS
40.0000 mg | ORAL_TABLET | Freq: Once | ORAL | Status: AC
  Administered 2012-02-18: 40 mg via ORAL
  Filled 2012-02-17: qty 1

## 2012-02-17 MED ORDER — IPRATROPIUM BROMIDE 0.02 % IN SOLN
0.5000 mg | Freq: Three times a day (TID) | RESPIRATORY_TRACT | Status: DC
  Administered 2012-02-18 – 2012-02-19 (×6): 0.5 mg via RESPIRATORY_TRACT
  Filled 2012-02-17 (×4): qty 2.5
  Filled 2012-02-17: qty 5
  Filled 2012-02-17: qty 2.5

## 2012-02-17 NOTE — Progress Notes (Addendum)
[facility] PROGRESS NOTE  Adysson Revelle ZOX:[id number] DOB: Dec 14, 1940 DOA: 02/14/2012 PCP: No primary provider on file.  Brief narrative  72 year old female resident of skilled nursing facility with history of diabetes, hypertension, blindness both eyes, history of TIA and history of dysphagia? Esophageal dilatation done in 2001 was brought to the ED with hypoxia significant leukocytosis. Chest x-ray showed bilateral airspace disease concerning for pneumonia.   Assessment/Plan:  #1 healthcare associated pneumonia  Given significant hypoxia , tachycardia and leukocytosis patient was admitted to step down monitoring. Patient started on empiric antibiotic IV vancomycin and IV cefazolin. Blood cultures sent on admission growing MRSA on both bottles.  -patient remains afebrile. Leukocytosis slowly improving. switched cefazolin to IV cefepime and IV Levaquin as well for broader coverage.  -Follow WBC and temperature curve.  -Flu negative    MRSA bacteremia  on vancomycin. Appreciate ID recs. Has a necrotic right second toe. MRI of the foot ordered. Orthopedics ( Dr Montez Morita ) consulted.  Acute respiratory failure  In the setting of pneumonia. currently stable   Diabetes mellitus  Continue sliding scale insulin and Lantus.   Hypertension  Currently stable. Continue home medications   Dysphagia  Seen by speech and swallow and MBS done today which showed esophageal dysphasia. Patient informs having esophagial dilatation done in 2001 by Dr. Arlyce Dice. [facility] consulted. Recommend standard barium swallow before deciding on EGD. On dys level 1 diet    Acute on Chronic kidney disease  Mildly elevated creatinine noted in a.m. labs. Poor urine output FeNA <1. Will check renal USG. Cont IV fluids   Code Status:full Code  Family Communication: none at bedside  Disposition Plan: back to SNF once stable    Consultants:  [facility]  ID  orthopedics   Procedures:  Modified barium swallow  ( 1/18) Antibiotics:  IV vanco , zosyn and Levaquin ( 1/17>>) HPI/Subjective:  Feels better overall. Noted for poor urine output overnight. afebrile   HPI/Subjective: Blood cx 2/2 on admission growing MRSA. Seen by ID.also has necrotic rt 2nd toe of unknown duration. Ortho consulted. MRI of foot ordered. informs her SOB to be better. Remains afebrile. Has low UOP.  Objective: Filed Vitals:   02/16/12 1600 02/16/12 1800 02/16/12 2058 02/17/12 0604  BP: 114/67 109/64 139/73 136/55  Pulse: 64 80 73 59  Temp: 98.2 F (36.8 C) 97.8 F (36.6 C) 97.5 F (36.4 C) 97.9 F (36.6 C)  TempSrc: Oral Oral Oral Oral  Resp: 17 18 18 18   Height:  5\' 2"  (1.575 m)    Weight:  90.8 kg (200 lb 2.8 oz)  92.2 kg (203 lb 4.2 oz)  SpO2: 100% 94% 95% 96%    Intake/Output Summary (Last 24 hours) at 02/17/12 0910 Last data filed at 02/17/12 8119  Gross per 24 hour  Intake   2065 ml  Output    165 ml  Net   1900 ml   Filed Weights   02/16/12 0300 02/16/12 1800 02/17/12 0604  Weight: 90.1 kg (198 lb 10.2 oz) 90.8 kg (200 lb 2.8 oz) 92.2 kg (203 lb 4.2 oz)    Exam:  General: Elderly female lying in bed in NAD  HEENT; blind, no pallor, moist oral mucosa Cardiovascular: N S1&S2, no murmurs  Respiratory: clear b/l scattered rhonchi  Abdomen: soft, NT, ND BS+  Ext: warm, no edema , necrotic rt 2nd . Distal pulses palpable. CNS: AAOX3, bilateral blindness   Data Reviewed: Basic Metabolic Panel:  Lab 02/16/12 1478 02/15/12 0345 02/14/12  1045  NA 131* 135 138  K 4.0 3.4* 4.6  CL 94* 98 97  CO2 23 24 22   GLUCOSE 203* 160* 96  BUN 74* 72* 71*  CREATININE 1.73* 1.48* 1.48*  CALCIUM 8.8 9.0 9.1  MG -- -- --  PHOS -- -- --   Liver Function Tests:  Lab 02/14/12 1045  AST 40*  ALT 24  ALKPHOS 174*  BILITOT 0.8  PROT 7.8  ALBUMIN 2.4*   No results found for this basename: LIPASE:5,AMYLASE:5 in the last 168 hours  Lab 02/14/12 1045  AMMONIA 32   CBC:  Lab 02/16/12 0629 02/15/12  0345 02/14/12 1045  WBC 19.9* 21.2* 26.9*  NEUTROABS -- -- 24.7*  HGB 10.4* 10.3* 10.4*  HCT 32.0* 31.1* 32.2*  MCV 83.8 83.8 86.1  PLT 343 374 385   Cardiac Enzymes:  Lab 02/14/12 1045  CKTOTAL --  CKMB --  CKMBINDEX --  TROPONINI <0.30   BNP (last 3 results)  Basename 02/14/12 1045 06/23/11 0900  PROBNP 7013.0* 1570.0*   CBG:  Lab 02/17/12 0756 02/16/12 2003 02/16/12 1614 02/16/12 1204 02/16/12 0800  GLUCAP 128* 133* 163* 156* 234*    Recent Results (from the past 240 hour(s))  CULTURE, BLOOD (ROUTINE X 2)     Status: Normal (Preliminary result)   Collection Time   02/14/12 10:35 AM      Component Value Range Status Comment   Specimen Description BLOOD RIGHT HAND   Final    Special Requests BOTTLES DRAWN AEROBIC ONLY   Final    Culture  Setup Time 02/14/2012 14:49   Final    Culture     Final    Value: STAPHYLOCOCCUS AUREUS     Note: RIFAMPIN AND GENTAMICIN SHOULD NOT BE USED AS SINGLE DRUGS FOR TREATMENT OF STAPH INFECTIONS.     Note: Gram Stain Report Called to,Read Back By and Verified With: JESSICA CONRAD 02/15/12 1440 BY SMITHERSJ   Report Status PENDING   Incomplete   CULTURE, BLOOD (ROUTINE X 2)     Status: Normal (Preliminary result)   Collection Time   02/14/12 10:45 AM      Component Value Range Status Comment   Specimen Description BLOOD RIGHT FOREARM   Final    Special Requests BOTTLES DRAWN AEROBIC ONLY   Final    Culture  Setup Time 02/14/2012 14:49   Final    Culture     Final    Value: METHICILLIN RESISTANT STAPHYLOCOCCUS AUREUS     Note: CRITICAL RESULT CALLED TO, READ BACK BY AND VERIFIED WITH: BETHANY STRINGER 02/16/12 @ 8:22PM BY RUSCA.     Note: CRITICAL RESULT CALLED TO, READ BACK BY AND VERIFIED WITH: MILLIE PRICE 02/16/12 @ 10:55AM BY RUSCA.   Report Status PENDING   Incomplete   URINE CULTURE     Status: Normal   Collection Time   02/14/12 10:46 AM      Component Value Range Status Comment   Specimen Description URINE, CATHETERIZED    Final    Special Requests NONE   Final    Culture  Setup Time 02/14/2012 18:58   Final    Colony Count NO GROWTH   Final    Culture NO GROWTH   Final    Report Status 02/15/2012 FINAL   Final   MRSA PCR SCREENING     Status: Normal   Collection Time   02/14/12  3:08 PM      Component Value Range Status Comment  MRSA by PCR NEGATIVE  NEGATIVE Final      Studies: Dg Swallowing Func-speech Pathology  02/16/2012  Chales Abrahams, CCC-SLP     02/16/2012 10:47 AM Objective Swallowing Evaluation: Modified Barium Swallowing Study   Patient Details  Name: Tanith Dagostino MRN: [id number] Date of Birth: Sep 18, 1940  Today's Date: 02/16/2012 Time: 0950-1015 SLP Time Calculation (min): 25 min  Past Medical History:  Past Medical History  Diagnosis Date  . Hypertension   . Diabetes mellitus   . Blindness   . TIA (transient ischemic attack)   . Anemia   . GERD (gastroesophageal reflux disease)   . Renal disorder   . Constipation    Past Surgical History:  Past Surgical History  Procedure Date  . Gallbladder surgery   . Esophageal dilation   . Cholecystectomy    HPI:  72 yo female adm to [facility] from [facility] SNF with  hypoxia, CXR + fluid, +/- pna- diagnosed with HCAP, ? CHF (await  BNP).  Per MD notes, pt told MD that she "always has pna."   PMH  + for TIA, GERD, anemia, constipation, HTN, blindness/HOH. SLP  phoned SNF SLP who reports she has not seen pt, she has however  seen the pt's roommate during meals and has not noted pt coughing  during meals.  Per SNF SLP, pt was recently "flagged for weight  loss" .  Esophageal dilatation completed in 03/1999 and pt states  she had problems swallowing before surgery that resolved after.    BSE completed Thursday pm and follow up Friday with  recommendation for MBS due to inability to rule out  dysphagia/aspiration at bedside with pt overtly coughing.     Assessment / Plan / Recommendation Clinical Impression  Dysphagia Diagnosis: Suspected primary esophageal  dysphagia;Mild  oral phase dysphagia;Mild pharyngeal phase dysphagia   Clinical impression: Pt presents with mild oropharyngeal and  suspected primary esophageal dysphagia.  Decreased oral control  results in premature spillage of liquids into pharynx and  piecemeal deglutition with solids/pudding as well as oral stasis  with poor awareness.  Cued swallow facilitated timeliness of  swallow.  Pharyngeal dysphagia with delayed swallow initiation to  pyriform sinus with liquids, to vallecular space with  solid/pudding and  resultant trace silent penetration of liquids.   Cued throat clear removed trace penetrates but pt requiried  verbal cues x4 to complete and became agitated.  Pt prematurely  spills pudding/cracker filling entire vallecular space for longer  than 30 seconds as she continued to orally manipulate remainder  of bolus before she actually triggers a swallow.  No aspiration  or penetration with pudding/cracker noted.  Pt did not arrive to  test with her dentures unfortunately, therefore cracker was  softened.       Suspect esophageal deficits significantly contribute to  oropharyngeal deficits.  Appearance of prominent cricopharyngeus  with retention of secretions x1, CP did not impact barium flow.   However pt did appear with stasis from distal to mid esophagus  with distal narrowing.  Above area of narrowed region, esophagus  appeared dilated (? Motility issues).  Retrograde propulsion of  barium occurred WITHOUT pt awareness.   Suspect pt's primary  dysphagia is esophageal.    Pt was instructed to compensatory strategies, testing findings  and recommendations during the procedure. Pt may benefit from GI  referral for esophageal dysphagia management (h/o esophageal  dilatation in 2001).  In the interim time, rec liquid diet in  small amounts for maximal  airway protection and medications be  crushed given with liquids if not contraindicated.  SLP to  follow.  Thanks.       Treatment Recommendation   Therapy as outlined in treatment plan below    Diet Recommendation Thin liquid (All Liquids)   Liquid Administration via: Cup;Straw Medication Administration: Other (Comment) (crush with liquids) Supervision: Full supervision/cueing for compensatory strategies Compensations: Small sips/bites;Slow rate;Clear throat  intermittently Postural Changes and/or Swallow Maneuvers: Seated upright 90  degrees;Upright 30-60 min after meal    Other  Recommendations Recommended Consults: Consider GI  evaluation Oral Care Recommendations: Oral care BID   Follow Up Recommendations       Frequency and Duration min 2x/week  1 week   Pertinent Vitals/Pain     SLP Swallow Goals Patient will utilize recommended strategies during swallow to  increase swallowing safety with: Total assistance   General HPI: 72 yo female adm to [facility] from [facility]  SNF with hypoxia, CXR + fluid, +/- pna- diagnosed with HCAP, ?  CHF (await BNP).  Per MD notes, pt told MD that she "always has  pna."   PMH + for TIA, GERD, anemia, constipation, HTN,  blindness/HOH. SLP phoned SNF SLP who reports she has not seen  pt, she has however seen the pt's roommate during meals and has  not noted pt coughing during meals.  Per SNF SLP, pt was recently  "flagged for weight loss" .  Esophageal dilatation completed in  03/1999 and pt states she had problems swallowing before surgery  that resolved after.   BSE completed Thursday pm and follow up  Friday with recommendation for MBS due to inability to rule out  dysphagia/aspiration at bedside with pt overtly coughing. Type of Study: Modified Barium Swallowing Study Reason for Referral: Objectively evaluate swallowing function Diet Prior to this Study: Nectar-thick liquids;Regular Temperature Spikes Noted: No Respiratory Status: Supplemental O2 delivered via (comment) Behavior/Cognition: Alert;Cooperative;Requires cueing;Decreased  sustained attention;Agitated (easily agitated when asked  questions) Oral Cavity -  Dentition: Dentures, not available (dentures left  in pt's room) Oral Motor / Sensory Function: Impaired - see Bedside swallow  eval Self-Feeding Abilities: Needs assist Patient Positioning: Upright in chair Baseline Vocal Quality: Clear Volitional Swallow: Unable to elicit (pt states she didn't have  anything to swallow) Anatomy: Within functional limits Pharyngeal Secretions: Not observed secondary MBS    Reason for Referral Objectively evaluate swallowing function   Oral Phase Oral Preparation/Oral Phase Oral Phase: Impaired Oral - Nectar Oral - Nectar Teaspoon:  (decr oral control results in premature  spillage ) Oral - Nectar Cup:  (decr oral control results in premature  spillage) Oral - Thin Oral - Thin Teaspoon:  (decr oral control results in premature  spillage) Oral - Thin Cup:  (decr oral control results in premature  spillage) Oral - Thin Straw:  (decr oral control results in premature  spillage) Oral - Solids Oral - Puree: Weak lingual manipulation;Delayed oral  transit;Piecemeal swallowing;Lingual/palatal residue Oral - Regular: Piecemeal swallowing;Weak lingual  manipulation;Delayed oral transit;Impaired  mastication;Lingual/palatal residue Oral Phase - Comment Oral Phase - Comment: decr oral control results in premature  spillage of all liquids, pt did not have dentures present for  exam- cracker was softened with thin barium and pudding   Pharyngeal Phase Pharyngeal Phase Pharyngeal Phase: Impaired Pharyngeal - Nectar Pharyngeal - Nectar Teaspoon: Premature spillage to valleculae Pharyngeal - Nectar Cup: Delayed swallow initiation;Premature  spillage to pyriform sinuses Pharyngeal - Thin Pharyngeal - Thin Teaspoon: Premature spillage  to pyriform  sinuses;Penetration/Aspiration during swallow Penetration/Aspiration details (thin teaspoon): Material enters  airway, remains ABOVE vocal cords then ejected out Pharyngeal - Thin Cup: Premature spillage to pyriform  sinuses;Penetration/Aspiration during  swallow Penetration/Aspiration details (thin cup): Material enters  airway, CONTACTS cords and not ejected out (cued throat clear  removed trace amount of penetration) Pharyngeal - Thin Straw: Premature spillage to pyriform sinuses Pharyngeal - Solids Pharyngeal - Puree: Delayed swallow initiation;Premature spillage  to pyriform sinuses (retention of barium at vallecular space  before swlalow) Pharyngeal - Regular: Premature spillage to valleculae;Delayed  swallow initiation (retention of cracker at vallecular space  before swallow)  Cervical Esophageal Phase    GO    Cervical Esophageal Phase Cervical Esophageal Phase: Impaired Cervical Esophageal Phase - Comment Cervical Esophageal Comment: appearance of prominent  cricopharyngeus with retention of secretions x1, did not impact  barium flow, appearance of stasis with distal narrowing and  appearance of dilated esophagus above narrowed region, retrograde  propulsion of barium appeared WITHOUT pt awareness          Donavan Burnet, MS [facility] SLP [phone]      Scheduled Meds:   . albuterol  2.5 mg Nebulization Once  . albuterol  2.5 mg Nebulization Q6H WA  . antiseptic oral rinse  15 mL Mouth Rinse q12n4p  . aspirin EC  325 mg Oral Daily  . brimonidine  1 drop Left Eye BID  . brinzolamide  1 drop Left Eye BID  . carvedilol  3.125 mg Oral Q breakfast  . chlorhexidine  15 mL Mouth/Throat BID  . docusate sodium  100 mg Oral BID  . feeding supplement  30 mL Oral TID WC  . gabapentin  100 mg Oral QHS  . insulin aspart  0-15 Units Subcutaneous TID WC  . insulin aspart  0-5 Units Subcutaneous QHS  . insulin glargine  10 Units Subcutaneous QHS  . ipratropium  0.5 mg Nebulization Q6H WA  . levofloxacin (LEVAQUIN) IV  750 mg Intravenous Q48H  . metoCLOPramide  5 mg Oral TID WC  . multivitamin with minerals  1 tablet Oral Daily  . piperacillin-tazobactam  3.375 g Intravenous Q8H  . sodium chloride  3 mL Intravenous Q12H  . sodium chloride  3 mL Intravenous  Q12H  . vancomycin  1,250 mg Intravenous Q24H     Time spent: 35 minutes    Lundy Cozart  [facility] Pager [phone]. If 8PM-8AM, please contact night-coverage at [email or web address], password [facility] 02/17/2012, 9:10 AM  LOS: 3 days

## 2012-02-17 NOTE — Progress Notes (Signed)
Reported off to Dr. Gonzella Lex through text page the following: Pt's urine tea color. Pt's output for since 0600 50 ml. Pt complaining of abdominal pain. Pt's BUN is 74. Coarse crackles when listen to breathe sounds. Possible fluid overload. Sent message around 1200 and 1300. Dr. Gonzella Lex responded to stop IV fluids and wait for results from US renal to decide further intervention. Pt is now saline locked. Continuing to monitor pt. Pt is responsive when talking to her but does not stay awake for long periods of time.

## 2012-02-17 NOTE — Progress Notes (Signed)
Pt has elevated BUN and Creat, wt is up, urine output is low, has perineal edema, IV fluids at 100/hr. Reported to day shift RN to make sure MD aware on rounds.  Barnett Hatter P

## 2012-02-17 NOTE — Progress Notes (Signed)
[facility]  Days of antibiotics # 4 Days # 4 vancomycin 3 days of zosyn 1 dose of levaquin  Subjective: No new complaints   Antibiotics:  Anti-infectives     Start     Dose/Rate Route Frequency Ordered Stop   02/16/12 1800   ceFAZolin (ANCEF) IVPB 1 g/50 mL premix  Status:  Discontinued        1 g 100 mL/hr over 30 Minutes Intravenous Every 8 hours 02/16/12 1357 02/16/12 1409   02/16/12 1600   levofloxacin (LEVAQUIN) IVPB 750 mg  Status:  Discontinued        750 mg 100 mL/hr over 90 Minutes Intravenous Every 48 hours 02/16/12 1415 02/17/12 0918   02/16/12 1500   piperacillin-tazobactam (ZOSYN) IVPB 3.375 g  Status:  Discontinued        3.375 g 12.5 mL/hr over 240 Minutes Intravenous 3 times per day 02/16/12 1415 02/17/12 0919   02/16/12 1000   vancomycin (VANCOCIN) 1,250 mg in sodium chloride 0.9 % 250 mL IVPB        1,250 mg 166.7 mL/hr over 90 Minutes Intravenous Every 24 hours 02/15/12 1318     02/15/12 1300   vancomycin (VANCOCIN) IVPB 1000 mg/200 mL premix  Status:  Discontinued        1,000 mg 200 mL/hr over 60 Minutes Intravenous Every 24 hours 02/14/12 1650 02/15/12 1318   02/14/12 2000   piperacillin-tazobactam (ZOSYN) IVPB 3.375 g  Status:  Discontinued        3.375 g 12.5 mL/hr over 240 Minutes Intravenous Every 8 hours 02/14/12 1650 02/16/12 1356   02/14/12 1145   vancomycin (VANCOCIN) IVPB 1000 mg/200 mL premix        1,000 mg 200 mL/hr over 60 Minutes Intravenous  Once 02/14/12 1143 02/14/12 1352   02/14/12 1145  piperacillin-tazobactam (ZOSYN) IVPB 3.375 g       3.375 g 100 mL/hr over 30 Minutes Intravenous  Once 02/14/12 1143 02/14/12 1244          Medications: Scheduled Meds:   . albuterol  2.5 mg Nebulization Once  . albuterol  2.5 mg Nebulization Q6H WA  . antiseptic oral rinse  15 mL Mouth Rinse q12n4p  . aspirin EC  325 mg Oral Daily  . brimonidine  1 drop Left Eye BID  . brinzolamide  1 drop Left Eye BID    . carvedilol  3.125 mg Oral Q breakfast  . chlorhexidine  15 mL Mouth/Throat BID  . docusate sodium  100 mg Oral BID  . feeding supplement  30 mL Oral TID WC  . gabapentin  100 mg Oral QHS  . insulin aspart  0-15 Units Subcutaneous TID WC  . insulin aspart  0-5 Units Subcutaneous QHS  . insulin glargine  10 Units Subcutaneous QHS  . ipratropium  0.5 mg Nebulization Q6H WA  . metoCLOPramide  5 mg Oral TID WC  . multivitamin with minerals  1 tablet Oral Daily  . sodium chloride  3 mL Intravenous Q12H  . sodium chloride  3 mL Intravenous Q12H  . vancomycin  1,250 mg Intravenous Q24H   Continuous Infusions:   . sodium chloride     PRN Meds:.sodium chloride, acetaminophen, acetaminophen, albuterol, HYDROcodone-homatropine, ondansetron (ZOFRAN) IV, ondansetron, RESOURCE THICKENUP CLEAR, sodium chloride, sodium chloride   Objective: Weight change: 1 lb 8.7 oz (0.7 kg)  Intake/Output Summary (Last 24 hours) at 02/17/12 1013 Last data filed at 02/17/12 1610  Gross per 24  hour  Intake   2065 ml  Output    165 ml  Net   1900 ml   Blood pressure 136/55, pulse 59, temperature 97.9 F (36.6 C), temperature source Oral, resp. rate 18, height 5\' 2"  (1.575 m), weight 203 lb 4.2 oz (92.2 kg), SpO2 96.00%. Temp:  [97.5 F (36.4 C)-98.2 F (36.8 C)] 97.9 F (36.6 C) (01/19 0604) Pulse Rate:  [59-80] 59  (01/19 0604) Resp:  [14-18] 18  (01/19 0604) BP: (109-139)/(55-73) 136/55 mmHg (01/19 0604) SpO2:  [94 %-100 %] 96 % (01/19 0604) Weight:  [200 lb 2.8 oz (90.8 kg)-203 lb 4.2 oz (92.2 kg)] 203 lb 4.2 oz (92.2 kg) (01/19 0604)  Physical Exam: General: Alert and awake, oriented, not in any acute distress .  HEENT: blind  , oropharynx clear and without exudate  CVS regular rate, normal r, no murmur rubs or gallops  Chest: fairly clear to auscultation bilaterally, no wheezing, rales or rhonchi  Abdomen: soft nontender, nondistended, normal bowel sounds,  Extremities: pt with necrotic toe  black  Neuro: lower extremities weak compromised by pain    Lab Results:  Basename 02/16/12 0629 02/15/12 0345  WBC 19.9* 21.2*  HGB 10.4* 10.3*  HCT 32.0* 31.1*  PLT 343 374    BMET  Basename 02/16/12 0658 02/15/12 0345  NA 131* 135  K 4.0 3.4*  CL 94* 98  CO2 23 24  GLUCOSE 203* 160*  BUN 74* 72*  CREATININE 1.73* 1.48*  CALCIUM 8.8 9.0    Micro Results: Recent Results (from the past 240 hour(s))  CULTURE, BLOOD (ROUTINE X 2)     Status: Normal   Collection Time   02/14/12 10:35 AM      Component Value Range Status Comment   Specimen Description BLOOD RIGHT HAND   Final    Special Requests BOTTLES DRAWN AEROBIC ONLY   Final    Culture  Setup Time 02/14/2012 14:49   Final    Culture     Final    Value: METHICILLIN RESISTANT STAPHYLOCOCCUS AUREUS     Note: RIFAMPIN AND GENTAMICIN SHOULD NOT BE USED AS SINGLE DRUGS FOR TREATMENT OF STAPH INFECTIONS. CRITICAL RESULT CALLED TO, READ BACK BY AND VERIFIED WITH: BETHANY STRINGER 02/16/12 @ 8:22PM BY RUSCA.     Note: Gram Stain Report Called to,Read Back By and Verified With: JESSICA CONRAD 02/15/12 1440 BY SMITHERSJ   Report Status 02/17/2012 FINAL   Final    Organism ID, Bacteria METHICILLIN RESISTANT STAPHYLOCOCCUS AUREUS   Final   CULTURE, BLOOD (ROUTINE X 2)     Status: Normal   Collection Time   02/14/12 10:45 AM      Component Value Range Status Comment   Specimen Description BLOOD RIGHT FOREARM   Final    Special Requests BOTTLES DRAWN AEROBIC ONLY   Final    Culture  Setup Time 02/14/2012 14:49   Final    Culture     Final    Value: METHICILLIN RESISTANT STAPHYLOCOCCUS AUREUS     Note: SUSCEPTIBILITIES PERFORMED ON PREVIOUS CULTURE WITHIN THE LAST 5 DAYS. CRITICAL RESULT CALLED TO, READ BACK BY AND VERIFIED WITH: BETHANY STRINGER 02/16/12 @ 8:22PM BY RUSCA.     Note: CRITICAL RESULT CALLED TO, READ BACK BY AND VERIFIED WITH: MILLIE PRICE 02/16/12 @ 10:55AM BY RUSCA.   Report Status 02/17/2012 FINAL   Final     URINE CULTURE     Status: Normal   Collection Time   02/14/12 10:46  AM      Component Value Range Status Comment   Specimen Description URINE, CATHETERIZED   Final    Special Requests NONE   Final    Culture  Setup Time 02/14/2012 18:58   Final    Colony Count NO GROWTH   Final    Culture NO GROWTH   Final    Report Status 02/15/2012 FINAL   Final   MRSA PCR SCREENING     Status: Normal   Collection Time   02/14/12  3:08 PM      Component Value Range Status Comment   MRSA by PCR NEGATIVE  NEGATIVE Final     Studies/Results: Dg Foot 2 Views Right  02/17/2012  *RADIOLOGY REPORT*  Clinical Data: Necrotic second toe right foot.  History of diabetes.  Staph bacteremia.  RIGHT FOOT - 2 VIEW  Comparison: None.  Findings: Underlying renal osteodystrophy with vascular calcifications.  Only AP and lateral radiographs performed; no oblique imaging.  Overlap of the digits on the lateral view. Question soft tissue ulcer about the lateral aspect of the second toe distally on AP view.  No gross osseous destruction.  No callus deposition.  IMPRESSION: Limited 2 view study, demonstrating no gross osseous destruction. Possible soft tissue defect about the second toe laterally.  If ongoing clinical concern, consider contrast enhanced MRI.   Original Report Authenticated By: Jeronimo Greaves, M.D.    Dg Swallowing Func-speech Pathology  02/16/2012  Chales Abrahams, CCC-SLP     02/16/2012 10:47 AM Objective Swallowing Evaluation: Modified Barium Swallowing Study   Patient Details  Name: [name] MRN: [id number] Date of Birth: 1940/04/25  Today's Date: 02/16/2012 Time: 0950-1015 SLP Time Calculation (min): 25 min  Past Medical History:  Past Medical History  Diagnosis Date  . Hypertension   . Diabetes mellitus   . Blindness   . TIA (transient ischemic attack)   . Anemia   . GERD (gastroesophageal reflux disease)   . Renal disorder   . Constipation    Past Surgical History:  Past Surgical History  Procedure Date  .  Gallbladder surgery   . Esophageal dilation   . Cholecystectomy    HPI:  72 yo female adm to [facility] from [facility] SNF with  hypoxia, CXR + fluid, +/- pna- diagnosed with HCAP, ? CHF (await  BNP).  Per MD notes, pt told MD that she "always has pna."   PMH  + for TIA, GERD, anemia, constipation, HTN, blindness/HOH. SLP  phoned SNF SLP who reports she has not seen pt, she has however  seen the pt's roommate during meals and has not noted pt coughing  during meals.  Per SNF SLP, pt was recently "flagged for weight  loss" .  Esophageal dilatation completed in 03/1999 and pt states  she had problems swallowing before surgery that resolved after.    BSE completed Thursday pm and follow up Friday with  recommendation for MBS due to inability to rule out  dysphagia/aspiration at bedside with pt overtly coughing.     Assessment / Plan / Recommendation Clinical Impression  Dysphagia Diagnosis: Suspected primary esophageal dysphagia;Mild  oral phase dysphagia;Mild pharyngeal phase dysphagia   Clinical impression: Pt presents with mild oropharyngeal and  suspected primary esophageal dysphagia.  Decreased oral control  results in premature spillage of liquids into pharynx and  piecemeal deglutition with solids/pudding as well as oral stasis  with poor awareness.  Cued swallow facilitated timeliness of  swallow.  Pharyngeal dysphagia with delayed  swallow initiation to  pyriform sinus with liquids, to vallecular space with  solid/pudding and  resultant trace silent penetration of liquids.   Cued throat clear removed trace penetrates but pt requiried  verbal cues x4 to complete and became agitated.  Pt prematurely  spills pudding/cracker filling entire vallecular space for longer  than 30 seconds as she continued to orally manipulate remainder  of bolus before she actually triggers a swallow.  No aspiration  or penetration with pudding/cracker noted.  Pt did not arrive to  test with her dentures unfortunately, therefore  cracker was  softened.       Suspect esophageal deficits significantly contribute to  oropharyngeal deficits.  Appearance of prominent cricopharyngeus  with retention of secretions x1, CP did not impact barium flow.   However pt did appear with stasis from distal to mid esophagus  with distal narrowing.  Above area of narrowed region, esophagus  appeared dilated (? Motility issues).  Retrograde propulsion of  barium occurred WITHOUT pt awareness.   Suspect pt's primary  dysphagia is esophageal.    Pt was instructed to compensatory strategies, testing findings  and recommendations during the procedure. Pt may benefit from GI  referral for esophageal dysphagia management (h/o esophageal  dilatation in 2001).  In the interim time, rec liquid diet in  small amounts for maximal airway protection and medications be  crushed given with liquids if not contraindicated.  SLP to  follow.  Thanks.       Treatment Recommendation  Therapy as outlined in treatment plan below    Diet Recommendation Thin liquid (All Liquids)   Liquid Administration via: Cup;Straw Medication Administration: Other (Comment) (crush with liquids) Supervision: Full supervision/cueing for compensatory strategies Compensations: Small sips/bites;Slow rate;Clear throat  intermittently Postural Changes and/or Swallow Maneuvers: Seated upright 90  degrees;Upright 30-60 min after meal    Other  Recommendations Recommended Consults: Consider GI  evaluation Oral Care Recommendations: Oral care BID   Follow Up Recommendations       Frequency and Duration min 2x/week  1 week   Pertinent Vitals/Pain     SLP Swallow Goals Patient will utilize recommended strategies during swallow to  increase swallowing safety with: Total assistance   General HPI: 72 yo female adm to [facility] from [facility]  SNF with hypoxia, CXR + fluid, +/- pna- diagnosed with HCAP, ?  CHF (await BNP).  Per MD notes, pt told MD that she "always has  pna."   PMH + for TIA, GERD, anemia,  constipation, HTN,  blindness/HOH. SLP phoned SNF SLP who reports she has not seen  pt, she has however seen the pt's roommate during meals and has  not noted pt coughing during meals.  Per SNF SLP, pt was recently  "flagged for weight loss" .  Esophageal dilatation completed in  03/1999 and pt states she had problems swallowing before surgery  that resolved after.   BSE completed Thursday pm and follow up  Friday with recommendation for MBS due to inability to rule out  dysphagia/aspiration at bedside with pt overtly coughing. Type of Study: Modified Barium Swallowing Study Reason for Referral: Objectively evaluate swallowing function Diet Prior to this Study: Nectar-thick liquids;Regular Temperature Spikes Noted: No Respiratory Status: Supplemental O2 delivered via (comment) Behavior/Cognition: Alert;Cooperative;Requires cueing;Decreased  sustained attention;Agitated (easily agitated when asked  questions) Oral Cavity - Dentition: Dentures, not available (dentures left  in pt's room) Oral Motor / Sensory Function: Impaired - see Bedside swallow  eval Self-Feeding Abilities: Needs assist  Patient Positioning: Upright in chair Baseline Vocal Quality: Clear Volitional Swallow: Unable to elicit (pt states she didn't have  anything to swallow) Anatomy: Within functional limits Pharyngeal Secretions: Not observed secondary MBS    Reason for Referral Objectively evaluate swallowing function   Oral Phase Oral Preparation/Oral Phase Oral Phase: Impaired Oral - Nectar Oral - Nectar Teaspoon:  (decr oral control results in premature  spillage ) Oral - Nectar Cup:  (decr oral control results in premature  spillage) Oral - Thin Oral - Thin Teaspoon:  (decr oral control results in premature  spillage) Oral - Thin Cup:  (decr oral control results in premature  spillage) Oral - Thin Straw:  (decr oral control results in premature  spillage) Oral - Solids Oral - Puree: Weak lingual manipulation;Delayed oral  transit;Piecemeal  swallowing;Lingual/palatal residue Oral - Regular: Piecemeal swallowing;Weak lingual  manipulation;Delayed oral transit;Impaired  mastication;Lingual/palatal residue Oral Phase - Comment Oral Phase - Comment: decr oral control results in premature  spillage of all liquids, pt did not have dentures present for  exam- cracker was softened with thin barium and pudding   Pharyngeal Phase Pharyngeal Phase Pharyngeal Phase: Impaired Pharyngeal - Nectar Pharyngeal - Nectar Teaspoon: Premature spillage to valleculae Pharyngeal - Nectar Cup: Delayed swallow initiation;Premature  spillage to pyriform sinuses Pharyngeal - Thin Pharyngeal - Thin Teaspoon: Premature spillage to pyriform  sinuses;Penetration/Aspiration during swallow Penetration/Aspiration details (thin teaspoon): Material enters  airway, remains ABOVE vocal cords then ejected out Pharyngeal - Thin Cup: Premature spillage to pyriform  sinuses;Penetration/Aspiration during swallow Penetration/Aspiration details (thin cup): Material enters  airway, CONTACTS cords and not ejected out (cued throat clear  removed trace amount of penetration) Pharyngeal - Thin Straw: Premature spillage to pyriform sinuses Pharyngeal - Solids Pharyngeal - Puree: Delayed swallow initiation;Premature spillage  to pyriform sinuses (retention of barium at vallecular space  before swlalow) Pharyngeal - Regular: Premature spillage to valleculae;Delayed  swallow initiation (retention of cracker at vallecular space  before swallow)  Cervical Esophageal Phase    GO    Cervical Esophageal Phase Cervical Esophageal Phase: Impaired Cervical Esophageal Phase - Comment Cervical Esophageal Comment: appearance of prominent  cricopharyngeus with retention of secretions x1, did not impact  barium flow, appearance of stasis with distal narrowing and  appearance of dilated esophagus above narrowed region, retrograde  propulsion of barium appeared WITHOUT pt awareness          Donavan Burnet, MS [facility] SLP  [phone]        Assessment/Plan: [name] is a 72 y.o. female diabetic with mETHICILLIN RESISTANT staph Aureus bacteremia likely from necrotic toe    #1  MR Staphylococcus aureus bacteremia (SAB) is associated with a high rate of complications and mortality.   Specific aspects of clinical management are critical to optimizing the outcome of patients with SAB. Therefore, the [facility] Antimicrobial Management Team ([facility]) have initiated an intervention aimed at improving the management of SAB at [facility]. To do so, Infectious Diseases Consultants are providing evidence-based recommendations for the management of all patients with SAB. The specific recommendations for this patient are marked "X" in this document.   Recommended Arly.Keller ] Completed [02/16/11] Perform follow-up blood cultures (even if the patient is afebrile) to ensure clearance of bacteremia.   CULTURES REPEATED YESTERDAY IF STLL POSITIVE NEED TO REPEAT AGAIN  Recommended [ ]  Completed [date] Remove vascular catheter, and obtain follow-up cultures after removal of catheter NA   Recommended Arly.Keller ] Completed [02/14/12] Perform echocardiography to evaluate for endocarditis (transthoracic ECHO  is 40-50% sensitive, TEE is > 90% sensitive).*   Please keep in mind, that neither test can definitively EXCLUDE endocarditis, and that should clinical suspicion remain high for endocarditis the patient should then still be treated with an "endocarditis" duration of therapy = 6 weeks   2 d echo with NO vegetations, consider TEE   Recommended [ ]  Completed [date] Consult electrophysiologist to evaluate implanted cardiac device (pacemaker, ICD) NA   Recommended [ X ] Completed [date] Ensure source control.   Have all abscesses been drained effectively?  Have deep seeded infections (septic joints or osteomyelitis) had appropriate surgical debridement?   NO  I think she is going to need surgical attention and likely amputation of her  necrotic toe.  Plain films are negative for osteo but I dont think this is viable tissue  Will get MRI and  She will need Orthopedic consult (called by Dr. Gonzella Lex)  Recommended [x ] Completed [date] Investigate for "metastatic" sites of infection.   Does the patient have ANY symptom or physical exam finding that would suggest a deeper infection (back or neck pain that may be suggestive of vertebral osteomyelitis or epidural abscess, muscle pain that could be a symptom of pyomyositis)?  Keep in mind that for deep seeded infections MRI imaging with contrast is preferred rather than other often insensitive tests such as plain x-rays, especially early in a patient's presentation.   See above re necrotic toe   Recommended [ x ]   NARROWED TO VANCOMYCIN DOSE FOR TARGET 15-20  [ x ] Estimated duration of IV Antibiotic therapy:  6-8 weeks   [x ] Consult case management for probable prolonged outpatient intravenous antibiotic therapy.   #2 Necrotic toe:  She needs an orthopedic consult for surgery likely an amputation  IN the interim I will get  MRI  Dr. Luciana Axe is back tomorrow   LOS: 3 days   Acey Lav 02/17/2012, 10:13 AM

## 2012-02-17 NOTE — Progress Notes (Signed)
CRITICAL VALUE ALERT  Critical value received:  Positive blood cultures for staph aureus MRSA  Date of notification:  02/16/12  Time of notification:  2030  Critical value read back:yes  Nurse who received alert:  B. Rameen Quinney  MD notified (1st page):  M. Lynch  Time of first page:  2037  MD notified (2nd page):  Time of second page:  Responding MD:  Burnadette Peter  Time MD responded:  2039

## 2012-02-17 NOTE — Progress Notes (Signed)
Patient ID: [name], female   DOB: 11/23/1940, 72 y.o.   MRN: [id number] This is a 30 female being treated for pneumonia and found to have gangrenous changes of her 2nd right toe. Patient states that she has had treatment for the past year  for her right foot. Consult note dictated with report # [id number]. IMPRESSION IS PERIPHERAL  VASCULAR DISEASE, GANGRENE RIGHT 2ND TOE. PLAN IS RIGHT RAY AMPUTATION RIGHT 2ND TOE WHEN PATIENT IS STABLE.

## 2012-02-18 ENCOUNTER — Inpatient Hospital Stay (HOSPITAL_COMMUNITY): Payer: Medicare Other | Admitting: Anesthesiology

## 2012-02-18 ENCOUNTER — Encounter (HOSPITAL_COMMUNITY)
Admission: EM | Disposition: A | Discharge status: Skilled Nursing Facility | Payer: Self-pay | Source: Home / Self Care | Attending: Internal Medicine

## 2012-02-18 ENCOUNTER — Encounter (HOSPITAL_COMMUNITY): Payer: Self-pay | Admitting: Anesthesiology

## 2012-02-18 HISTORY — PX: AMPUTATION: SHX166

## 2012-02-18 LAB — SURGICAL PCR SCREEN: MRSA, PCR: NEGATIVE

## 2012-02-18 LAB — GLUCOSE, CAPILLARY
Glucose-Capillary: 94 mg/dL (ref 70–99)
Glucose-Capillary: 99 mg/dL (ref 70–99)

## 2012-02-18 LAB — VANCOMYCIN, TROUGH: Vancomycin Tr: 35.2 ug/mL (ref 10.0–20.0)

## 2012-02-18 LAB — PROCALCITONIN: Procalcitonin: 0.73 ng/mL

## 2012-02-18 LAB — BASIC METABOLIC PANEL
GFR calc non Af Amer: 18 mL/min — ABNORMAL LOW (ref >90)
Glucose, Bld: 116 mg/dL — ABNORMAL HIGH (ref 70–99)
Potassium: 3.6 mEq/L (ref 3.5–5.1)
Sodium: 131 mEq/L — ABNORMAL LOW (ref 135–145)

## 2012-02-18 SURGERY — AMPUTATION, FOOT, RAY
Anesthesia: General | Site: Foot | Laterality: Right | Wound class: Dirty or Infected

## 2012-02-18 MED ORDER — PHENYLEPHRINE HCL 10 MG/ML IJ SOLN
INTRAMUSCULAR | Status: DC | PRN
  Administered 2012-02-18: 80 ug via INTRAVENOUS

## 2012-02-18 MED ORDER — 0.9 % SODIUM CHLORIDE (POUR BTL) OPTIME
TOPICAL | Status: DC | PRN
  Administered 2012-02-18: 1000 mL

## 2012-02-18 MED ORDER — SUCCINYLCHOLINE CHLORIDE 20 MG/ML IJ SOLN
INTRAMUSCULAR | Status: DC | PRN
  Administered 2012-02-18: 100 mg via INTRAVENOUS

## 2012-02-18 MED ORDER — VANCOMYCIN 50 MG/ML ORAL SOLUTION: 500.0000 mg | Freq: Once | ORAL | Status: DC

## 2012-02-18 MED ORDER — PROMETHAZINE HCL 25 MG/ML IJ SOLN: 6.2500 mg | INTRAMUSCULAR | Status: DC | PRN

## 2012-02-18 MED ORDER — CEFAZOLIN SODIUM-DEXTROSE 2-3 GM-% IV SOLR: 2.0000 g | INTRAVENOUS | Status: DC

## 2012-02-18 MED ORDER — SODIUM CHLORIDE 0.9 % IV SOLN
INTRAVENOUS | Status: DC
  Administered 2012-02-18: 1000 mL via INTRAVENOUS

## 2012-02-18 MED ORDER — VANCOMYCIN HCL 1000 MG IV SOLR
1000.0000 mg | INTRAVENOUS | Status: DC | PRN
  Administered 2012-02-18: 500 mg via INTRAVENOUS

## 2012-02-18 MED ORDER — NALOXONE HCL 0.4 MG/ML IJ SOLN
INTRAMUSCULAR | Status: DC | PRN
  Administered 2012-02-18: 160 ug via INTRAVENOUS
  Administered 2012-02-18: 80 ug via INTRAVENOUS

## 2012-02-18 MED ORDER — LACTATED RINGERS IV SOLN: INTRAVENOUS | Status: DC

## 2012-02-18 MED ORDER — CHLORHEXIDINE GLUCONATE 4 % EX LIQD
60.0000 mL | Freq: Once | CUTANEOUS | Status: AC
  Administered 2012-02-18: 4 via TOPICAL
  Filled 2012-02-18 (×2): qty 60

## 2012-02-18 MED ORDER — FUROSEMIDE 10 MG/ML IJ SOLN
40.0000 mg | Freq: Once | INTRAMUSCULAR | Status: AC
  Administered 2012-02-18: 40 mg via INTRAVENOUS
  Filled 2012-02-18: qty 4

## 2012-02-18 MED ORDER — ATROPINE SULFATE 0.4 MG/ML IJ SOLN
INTRAMUSCULAR | Status: DC | PRN
  Administered 2012-02-18 (×2): 0.2 mg via INTRAVENOUS
  Administered 2012-02-18: 0.4 mg via INTRAVENOUS

## 2012-02-18 SURGICAL SUPPLY — 31 items
BAG SPEC THK2 15X12 ZIP CLS (MISCELLANEOUS) ×1
BAG ZIPLOCK 12X15 (MISCELLANEOUS) ×2 IMPLANT
BANDAGE ELASTIC 4 VELCRO ST LF (GAUZE/BANDAGES/DRESSINGS) ×2 IMPLANT
BANDAGE ESMARK 6X9 LF (GAUZE/BANDAGES/DRESSINGS) ×1 IMPLANT
BANDAGE GAUZE ELAST BULKY 4 IN (GAUZE/BANDAGES/DRESSINGS) ×4 IMPLANT
BLADE SURG 15 STRL LF DISP TIS (BLADE) ×2 IMPLANT
BLADE SURG 15 STRL SS (BLADE) ×2
BNDG ESMARK 6X9 LF (GAUZE/BANDAGES/DRESSINGS) ×2
CLOTH BEACON ORANGE TIMEOUT ST (SAFETY) ×2 IMPLANT
CUFF TOURN SGL QUICK 34 (TOURNIQUET CUFF) ×1
CUFF TRNQT CYL 34X4X40X1 (TOURNIQUET CUFF) ×1 IMPLANT
DRAIN PENROSE 18X1/4 LTX STRL (WOUND CARE) ×2 IMPLANT
DRSG EMULSION OIL 3X3 NADH (GAUZE/BANDAGES/DRESSINGS) ×2 IMPLANT
DRSG PAD ABDOMINAL 8X10 ST (GAUZE/BANDAGES/DRESSINGS) ×2 IMPLANT
DRSG TEGADERM 4X4.75 (GAUZE/BANDAGES/DRESSINGS) ×2 IMPLANT
DURAPREP 26ML APPLICATOR (WOUND CARE) IMPLANT
ELECT REM PT RETURN 9FT ADLT (ELECTROSURGICAL) ×2
ELECTRODE REM PT RTRN 9FT ADLT (ELECTROSURGICAL) ×1 IMPLANT
GLOVE SURG ORTHO 9.0 STRL STRW (GLOVE) ×2 IMPLANT
KIT BASIN OR (CUSTOM PROCEDURE TRAY) ×2 IMPLANT
MANIFOLD NEPTUNE II (INSTRUMENTS) IMPLANT
PACK LOWER EXTREMITY WL (CUSTOM PROCEDURE TRAY) ×2 IMPLANT
PAD CAST 4YDX4 CTTN HI CHSV (CAST SUPPLIES) ×1 IMPLANT
PADDING CAST COTTON 4X4 STRL (CAST SUPPLIES) ×1
POSITIONER SURGICAL ARM (MISCELLANEOUS) ×2 IMPLANT
SOL PREP POV-IOD 16OZ 10% (MISCELLANEOUS) IMPLANT
SOL PREP PROV IODINE SCRUB 4OZ (MISCELLANEOUS) IMPLANT
SPONGE GAUZE 4X4 12PLY (GAUZE/BANDAGES/DRESSINGS) ×4 IMPLANT
SUT ETHILON 2 0 PS N (SUTURE) ×6 IMPLANT
SUT ETHILON 3 0 PS 1 (SUTURE) ×2 IMPLANT
TRAY PREP A LATEX SAFE STRL (SET/KITS/TRAYS/PACK) ×2 IMPLANT

## 2012-02-18 NOTE — Consult Note (Signed)
NAMESHAQUERA, ANSLEY NO.:  [id number]  MEDICAL RECORD NO.:  [id number]  LOCATION:  1306                         FACILITY:  [facility]  PHYSICIAN:  Myrtie Neither, MD      DATE OF BIRTH:  Mar 25, 1940  DATE OF CONSULTATION:  02/17/2012 DATE OF DISCHARGE:                                CONSULTATION   REFERRING PHYSICIAN:  Dr. Dorris Carnes. Dhungel.  REASON FOR CONSULTATION:  Gangrene, right second toe.  PERTINENT HISTORY:  This is a 72 year old female who was recently admitted for pneumonia, being treated for over 4 years and was found to have gangrenous changes involving her right second toe.  The patient states she has had problem with the right foot over the past year and they have been just using different ointments to it.  The patient has noted that the toe progressively stiffens and the patient is legally blind, so has not been able to visually see changes.  PAST MEDICAL HISTORY:  Diabetes mellitus, blindness bilateral, essential hypertension, pneumonia, shortness of breath, bacteremia due to Gram- positive bacteria, MRSA bacteremia, and oliguria.  PREVIOUS OPERATIONS:  Esophageal dilatation, gallbladder surgery, and cholecystectomy.  ALLERGIES:  None known.  SOCIAL HISTORY:  Denies use of alcohol, illegal drugs.  The patient also denies any history of tobacco.  FAMILY HISTORY:  Noncontributory.  REVIEW OF SYSTEMS:  Negative shortness of breath due to respiratory __________, pneumonia and patient is blind.  PHYSICAL EXAMINATION:  GENERAL:  The patient is alert and oriented, in no acute distress. VITAL SIGNS:  Temperature 97.6, blood pressure 151/70, pulse 60, respirations 20, O2 saturation 94%.  Height 5 feet 2 inches, weight 90.8 kg. HEAD:  Normocephalic. EXTREMITIES:  The patient is able to actively move both lower extremities and respond to questions appropriately.  Examination of the right foot reveals a rigid ischemic gangrenous second toe.  Right  foot itself is warm to touch.  Dorsalis pedis is nonpalpable.  The remaining of the toes are pink and blanch well.  Left foot is warm with good blanching of soft tissue.  No gangrenous changes.  IMAGING:  X-rays revealed atherosclerotic changes of vessels in the right foot with radiolucencies of all toes as well as metatarsals suggesting ischemic changes.  No apparent evidence of osteomyelitis.  LABORATORY DATA:  The patient's white cell count was 21.2, dropped down to 19.9 with a low RBC, hemoglobin 10.4, hematocrit 32.  BUN is elevated at 74.  Creatinine is also elevated at 1.73.  __________ is moderately low at 94 as well as __________ moderate low 131.  IMPRESSION:  Gangrene, right second toe, peripheral vascular disease.  PLAN:  We will check with medical admitting doctors as to the patient's stability being stable enough to undergo second ray amputation.     Myrtie Neither, MD     AC/MEDQ  D:  02/17/2012  T:  02/17/2012  Job:  [id number]

## 2012-02-18 NOTE — Op Note (Signed)
NAMEALEXXIA, STANKIEWICZ NO.:  [id number]  MEDICAL RECORD NO.:  [id number]  LOCATION:  1306                         FACILITY:  [facility]  PHYSICIAN:  Myrtie Neither, MD      DATE OF BIRTH:  02-21-1940  DATE OF PROCEDURE:  02/18/2012 DATE OF DISCHARGE:                              OPERATIVE REPORT   PREOPERATIVE DIAGNOSIS:  Gangrene, right second toe.  POSTOPERATIVE DIAGNOSIS:  Gangrene, right second toe.  ANESTHESIA:  General.  PROCEDURE:  Right amputation, right second toe.  The patient was taken to the operating room.  After given adequate preop medications, given general anesthesia and intubated.  Right foot was scrubbed and painted with Betadine solution, and draped in sterile fashion.  Tourniquet was put on, but not used.  Midline incision was made over the second metatarsal both through the skin and subcutaneous tissue.  Gentle periosteal elevation of the soft tissues was done on the second metatarsal.  The second metatarsal was resected with a bone cutter, and with use of free and sharp blade, the second toe was resected.  There was active bleeding in the area, Bovie was used. Irrigation with saline solution was done.  Wound closure was done with 2- 0 nylon.  A quarter-inch Penrose drain was placed and bulky compressive dressing was applied.  The patient tolerated the procedure quite well, went to recovery room in stable and satisfactory condition.  The patient did receive 500 mg of vancomycin.     Myrtie Neither, MD     AC/MEDQ  D:  02/18/2012  T:  02/18/2012  Job:  [id number]

## 2012-02-18 NOTE — Progress Notes (Signed)
ANTIBIOTIC CONSULT NOTE - FOLLOW UP  Pharmacy Consult for Vanc Indication: MRSA bacteremia/necrotic right toe for amputation 1/20  No Known Allergies  Patient Measurements: Height: 5\' 2"  (157.5 cm) Weight: 203 lb 4.2 oz (92.2 kg) IBW/kg (Calculated) : 50.1    Labs:  Basename 02/18/12 0849 02/16/12 0837 02/16/12 0658 02/16/12 0629  WBC -- -- -- 19.9*  HGB -- -- -- 10.4*  PLT -- -- -- 343  LABCREA -- 100.8 -- --  CREATININE 2.57* -- 1.73* --   Estimated Creatinine Clearance: 21.8 ml/min (by C-G formula based on Cr of 2.57).  Basename 02/18/12 0848  VANCOTROUGH 35.2*  VANCOPEAK --  Drue Dun --  GENTTROUGH --  GENTPEAK --  GENTRANDOM --  TOBRATROUGH --  Nolen Mu --  TOBRARND --  AMIKACINPEAK --  AMIKACINTROU --  AMIKACIN --     Assessment:  64 yof resident of [facility] presented 1/16 with c/o SOB. Flu test obtained at SNF and CXR negative for PNA per MD note.  Patient reported h/o recurrent PNA. Azith PO and Rocephin IM @ SNF started 1/15.  CXR here cannot r/o infectious process.   Today is day#5 vanc for MRSA bacteremia secondary to necrotic right toe for amputation today  Vanc Trough supratherapeutic  Rising SCr - ? vanc related at this point  Goal of Therapy:  Vancomycin trough level 15-20 mcg/ml  Plan:  1) D/C vancomycin for now due to rising SCr and elevated Vanc trough 2) Recheck Scr and random vanc level in AM 1/21   Hessie Knows, PharmD, BCPS Pager [phone] 02/18/2012 10:13 AM

## 2012-02-18 NOTE — Brief Op Note (Signed)
02/14/2012 - 02/18/2012  11:39 AM  PATIENT:  [name]  72 y.o. female  PRE-OPERATIVE DIAGNOSIS:  gangrenous right second toe  POST-OPERATIVE DIAGNOSIS:  gangrenous right second toe  PROCEDURE:  Procedure(s) (LRB) with comments: AMPUTATION RAY (Right) - right second toe  SURGEON:  Surgeon(s) and Role:    * Kennieth Rad, MD - Primary  PHYSICIAN ASSISTANT:   ASSISTANTS: none   ANESTHESIA:   general  EBL:     BLOOD ADMINISTERED:none  DRAINS: Penrose drain in the quarter inch penrose placed in first wed space   LOCAL MEDICATIONS USED:  NONE  SPECIMEN:  Source of Specimen:  second toe right foot  DISPOSITION OF SPECIMEN:  PATHOLOGY  COUNTS:  YES  TOURNIQUET:  * No tourniquets in log *  DICTATION: .Other Dictation: Dictation Number REPORT #[id number]  PLAN OF CARE: Admit to inpatient   PATIENT DISPOSITION:  PACU - hemodynamically stable.   Delay start of Pharmacological VTE agent (>24hrs) due to surgical blood loss or risk of bleeding: no

## 2012-02-18 NOTE — Anesthesia Procedure Notes (Signed)
Procedure Name: Intubation Date/Time: 02/18/2012 11:05 AM Performed by: Durward Parcel A Pre-anesthesia Checklist: Patient identified, Timeout performed, Emergency Drugs available, Suction available and Patient being monitored Patient Re-evaluated:Patient Re-evaluated prior to inductionOxygen Delivery Method: Simple face mask and Circle system utilized Preoxygenation: Pre-oxygenation with 100% oxygen Intubation Type: IV induction and Rapid sequence Ventilation: Mask ventilation with difficulty, Two handed mask ventilation required and Oral airway inserted - appropriate to patient size Laryngoscope Size: Mac and 3 Grade View: Grade II Tube type: Oral Tube size: 7.5 mm Number of attempts: 1 Airway Equipment and Method: Bougie stylet Placement Confirmation: ETT inserted through vocal cords under direct vision,  positive ETCO2 and breath sounds checked- equal and bilateral

## 2012-02-18 NOTE — Progress Notes (Signed)
Clinical Social Worker continuing to follow for discharge planning. Pt is from [facility] and plan is for pt to return when medically stable for discharge. Clinical Social Worker spoke with facility who stated that pt needs PT/OT evaluation for [employer] authorization. Clinical Social Worker notified MD of this. Clinical Social Worker to continue to follow.  Jacklynn Lewis, MSW, LCSWA  Clinical Social Work [phone]

## 2012-02-18 NOTE — Transfer of Care (Signed)
Immediate Anesthesia Transfer of Care Note  Patient: [name]  Procedure(s) Performed: Procedure(s) (LRB) with comments: AMPUTATION RAY (Right) - right second toe  Patient Location: PACU  Anesthesia Type:General  Level of Consciousness: alert , oriented and patient cooperative  Airway & Oxygen Therapy: Patient Spontanous Breathing and Patient connected to face mask oxygen  Post-op Assessment: Report given to PACU RN, Post -op Vital signs reviewed and stable and Patient moving all extremities  Post vital signs: Reviewed and stable  Complications: No apparent anesthesia complications

## 2012-02-18 NOTE — Care Management (Signed)
Chart reviewed for LOS.  [name] [phone]

## 2012-02-18 NOTE — Anesthesia Preprocedure Evaluation (Addendum)
Anesthesia Evaluation  Patient identified by MRN, date of birth, ID band Patient awake    Reviewed: Allergy & Precautions, H&P , NPO status , Patient's Chart, lab work & pertinent test results  Airway Mallampati: II TM Distance: >3 FB Neck ROM: Full    Dental  (+) Edentulous Upper and Edentulous Lower   Pulmonary shortness of breath, pneumonia -,  + rhonchi         Cardiovascular hypertension, Pt. on medications + Peripheral Vascular Disease and +CHF + dysrhythmias Rhythm:Regular Rate:Normal     Neuro/Psych Patient is blind TIACVA negative psych ROS   GI/Hepatic Neg liver ROS, GERD-  ,  Endo/Other  diabetes, Type 2, Insulin DependentMorbid obesity  Renal/GU Renal disease  negative genitourinary   Musculoskeletal negative musculoskeletal ROS (+)   Abdominal (+) + obese,   Peds  Hematology negative hematology ROS (+)   Anesthesia Other Findings   Reproductive/Obstetrics negative OB ROS                          Anesthesia Physical Anesthesia Plan  ASA: III  Anesthesia Plan: General   Post-op Pain Management:    Induction: Intravenous  Airway Management Planned: LMA  Additional Equipment:   Intra-op Plan:   Post-operative Plan: Extubation in OR  Informed Consent: I have reviewed the patients History and Physical, chart, labs and discussed the procedure including the risks, benefits and alternatives for the proposed anesthesia with the patient or authorized representative who has indicated his/her understanding and acceptance.   Dental advisory given  Plan Discussed with: CRNA  Anesthesia Plan Comments:         Anesthesia Quick Evaluation

## 2012-02-18 NOTE — Progress Notes (Addendum)
[facility] PROGRESS NOTE  [name] NFA:[id number] DOB: Mar 28, 1940 DOA: 02/14/2012 PCP: No primary provider on file.  Brief narrative  72 year old female resident of skilled nursing facility with history of diabetes, hypertension, blindness both eyes, history of TIA and history of dysphagia? Esophageal dilatation done in 2001 was brought to the ED with hypoxia significant leukocytosis. Chest x-ray showed bilateral airspace disease concerning for pneumonia.   Assessment/Plan:  #1 healthcare associated pneumonia  Given significant hypoxia , tachycardia and leukocytosis patient was admitted to step down monitoring. Patient started on empiric antibiotic IV vancomycin and IV cefazolin. Blood cultures sent on admission growing MRSA on both bottles.  -patient remains afebrile. Leukocytosis slowly improving. switched cefazolin to IV cefepime and IV Levaquin as well for broader coverage.  -Follow WBC and temperature curve.  -Flu negative   MRSA bacteremia  on vancomycin. Appreciate ID recs. Has a necrotic right second toe. MRI of the foot showed osteomyelitis. Seen by orthopedic consult and had amputation of the right second toe done on 1/20 and tolerated procedure well. Will discuss with ID on duration of antibiotics  Acute on Chronic kidney disease  Patient has a oliguria with elevated creatinine. FeNa calculated was less than 1. Not improved with IV hydration. Renal ultrasound unremarkable. Repeated proBNP which was markedly elevated concerning for volume overload. Chest x-ray obtained showed concerning for a mild congestive heart failure. Order for IV Lasix and we'll monitor urine output closely. Avoid NSAIDs nephrotoxic agents. Will request renal consultation if unimproved by tomorrow.   Acute respiratory failure  In the setting of pneumonia. currently stable   Diabetes mellitus  Continue sliding scale insulin and Lantus.   Hypertension  Currently stable. Continue home medications    Dysphagia  Seen by speech and swallow and MBS done today which showed esophageal dysphasia. Patient informs having esophagial dilatation done in 2001 by Dr. Arlyce Dice. [facility] consulted. Recommend standard barium swallow before deciding on EGD.  On dys level 1 diet    Code Status:full Code  Family Communication: none at bedside . Patient has a daughter whom i have been unable to reach for past 2 days Disposition Plan: back to SNF once stable . Ordered PT eval   Consultants:  [facility]  ID  orthopedics Procedures:  Modified barium swallow ( 1/18) Right second toe amputation on 1/20  Antibiotics:  IV vanco , zosyn and Levaquin ( 1/17>>)     HPI/Subjective: Patient continued to have low urine output overnight. Also lost her IV and could not receive IV Lasix. Was given a dose of oral Lasix as her pro BNP was elevated. Patient taken to OR today with amputation of right second toe. Tolerated procedure well denied any pain on evaluation postop. She is given a dose of IV Lasix her to the floor.  Objective: Filed Vitals:   02/18/12 1230 02/18/12 1245 02/18/12 1258 02/18/12 1513  BP: 139/71 140/74 156/62   Pulse: 63 63 62   Temp:  97 F (36.1 C) 97.2 F (36.2 C)   TempSrc:      Resp: 13 12 14    Height:      Weight:      SpO2: 98% 99% 99% 100%    Intake/Output Summary (Last 24 hours) at 02/18/12 1742 Last data filed at 02/18/12 1245  Gross per 24 hour  Intake   1200 ml  Output    190 ml  Net   1010 ml   Filed Weights   02/16/12 0300 02/16/12 1800 02/17/12 0604  Weight: 90.1 kg (198 lb 10.2 oz) 90.8 kg (200 lb 2.8 oz) 92.2 kg (203 lb 4.2 oz)    Exam:  General: Elderly female lying in bed in NAD  Cardiovascular: N S1&S2, no murmurs  Respiratory: clear b/l scattered rhonchi  Abdomen: soft, NT, ND BS+  Ext: warm, trace edema, necrotic right second toe CNS: AAOX3, bilateral blindness   Data Reviewed: Basic Metabolic Panel:  Lab 02/18/12 1914 02/16/12 0658 02/15/12  0345 02/14/12 1045  NA 131* 131* 135 138  K 3.6 4.0 3.4* 4.6  CL 93* 94* 98 97  CO2 20 23 24 22   GLUCOSE 116* 203* 160* 96  BUN 79* 74* 72* 71*  CREATININE 2.57* 1.73* 1.48* 1.48*  CALCIUM 9.0 8.8 9.0 9.1  MG -- -- -- --  PHOS -- -- -- --   Liver Function Tests:  Lab 02/14/12 1045  AST 40*  ALT 24  ALKPHOS 174*  BILITOT 0.8  PROT 7.8  ALBUMIN 2.4*   No results found for this basename: LIPASE:5,AMYLASE:5 in the last 168 hours  Lab 02/14/12 1045  AMMONIA 32   CBC:  Lab 02/16/12 0629 02/15/12 0345 02/14/12 1045  WBC 19.9* 21.2* 26.9*  NEUTROABS -- -- 24.7*  HGB 10.4* 10.3* 10.4*  HCT 32.0* 31.1* 32.2*  MCV 83.8 83.8 86.1  PLT 343 374 385   Cardiac Enzymes:  Lab 02/14/12 1045  CKTOTAL --  CKMB --  CKMBINDEX --  TROPONINI <0.30   BNP (last 3 results)  Basename 02/17/12 1844 02/14/12 1045 06/23/11 0900  PROBNP 14005.0* 7013.0* 1570.0*   CBG:  Lab 02/18/12 1229 02/18/12 0735 02/17/12 2046 02/17/12 1733 02/17/12 1223  GLUCAP 94 111* 113* 106* 95    Recent Results (from the past 240 hour(s))  CULTURE, BLOOD (ROUTINE X 2)     Status: Normal   Collection Time   02/14/12 10:35 AM      Component Value Range Status Comment   Specimen Description BLOOD RIGHT HAND   Final    Special Requests BOTTLES DRAWN AEROBIC ONLY   Final    Culture  Setup Time 02/14/2012 14:49   Final    Culture     Final    Value: METHICILLIN RESISTANT STAPHYLOCOCCUS AUREUS     Note: RIFAMPIN AND GENTAMICIN SHOULD NOT BE USED AS SINGLE DRUGS FOR TREATMENT OF STAPH INFECTIONS. CRITICAL RESULT CALLED TO, READ BACK BY AND VERIFIED WITH: BETHANY STRINGER 02/16/12 @ 8:22PM BY RUSCA.     Note: Gram Stain Report Called to,Read Back By and Verified With: JESSICA CONRAD 02/15/12 1440 BY SMITHERSJ   Report Status 02/17/2012 FINAL   Final    Organism ID, Bacteria METHICILLIN RESISTANT STAPHYLOCOCCUS AUREUS   Final   CULTURE, BLOOD (ROUTINE X 2)     Status: Normal   Collection Time   02/14/12 10:45  AM      Component Value Range Status Comment   Specimen Description BLOOD RIGHT FOREARM   Final    Special Requests BOTTLES DRAWN AEROBIC ONLY   Final    Culture  Setup Time 02/14/2012 14:49   Final    Culture     Final    Value: METHICILLIN RESISTANT STAPHYLOCOCCUS AUREUS     Note: SUSCEPTIBILITIES PERFORMED ON PREVIOUS CULTURE WITHIN THE LAST 5 DAYS. CRITICAL RESULT CALLED TO, READ BACK BY AND VERIFIED WITH: BETHANY STRINGER 02/16/12 @ 8:22PM BY RUSCA.     Note: CRITICAL RESULT CALLED TO, READ BACK BY AND VERIFIED WITH: MILLIE PRICE 02/16/12 @  10:55AM BY RUSCA.   Report Status 02/17/2012 FINAL   Final   URINE CULTURE     Status: Normal   Collection Time   02/14/12 10:46 AM      Component Value Range Status Comment   Specimen Description URINE, CATHETERIZED   Final    Special Requests NONE   Final    Culture  Setup Time 02/14/2012 18:58   Final    Colony Count NO GROWTH   Final    Culture NO GROWTH   Final    Report Status 02/15/2012 FINAL   Final   MRSA PCR SCREENING     Status: Normal   Collection Time   02/14/12  3:08 PM      Component Value Range Status Comment   MRSA by PCR NEGATIVE  NEGATIVE Final   CULTURE, BLOOD (ROUTINE X 2)     Status: Normal (Preliminary result)   Collection Time   02/16/12  4:30 PM      Component Value Range Status Comment   Specimen Description BLOOD RIGHT HAND  3 ML IN [facility] BOTTLE   Final    Special Requests NONE   Final    Culture  Setup Time 02/16/2012 23:17   Final    Culture     Final    Value:        BLOOD CULTURE RECEIVED NO GROWTH TO DATE CULTURE WILL BE HELD FOR 5 DAYS BEFORE ISSUING A FINAL NEGATIVE REPORT   Report Status PENDING   Incomplete   CULTURE, BLOOD (ROUTINE X 2)     Status: Normal (Preliminary result)   Collection Time   02/16/12  4:48 PM      Component Value Range Status Comment   Specimen Description BLOOD RIGHT ARM  2 ML IN [facility] BOTTLE   Final    Special Requests NONE   Final    Culture  Setup Time 02/16/2012 23:16   Final     Culture     Final    Value:        BLOOD CULTURE RECEIVED NO GROWTH TO DATE CULTURE WILL BE HELD FOR 5 DAYS BEFORE ISSUING A FINAL NEGATIVE REPORT   Report Status PENDING   Incomplete   SURGICAL PCR SCREEN     Status: Normal   Collection Time   02/18/12  5:25 AM      Component Value Range Status Comment   MRSA, PCR NEGATIVE  NEGATIVE Final    Staphylococcus aureus NEGATIVE  NEGATIVE Final      Studies: Dg Chest 2 View  02/17/2012  *RADIOLOGY REPORT*  Clinical Data: Increased cough and shortness of breath.  Question fluid overload.  CHEST - 2 VIEW  Comparison: Chest radiograph 02/15/2012.  Findings: Stable cardiomegaly.  There is diffuse pulmonary vascular congestion with interstitial prominence in the perihilar regions of lung bases.  Patchy airspace opacities in the left mid lung.  Small bilateral pleural effusions.  Thickening of the fissures on the lateral view.  IMPRESSION: Mild congestive heart failure pattern with small bilateral pleural effusions.   Original Report Authenticated By: Britta Mccreedy, M.D.    US Renal  02/17/2012  *RADIOLOGY REPORT*  Clinical Data: Acute renal insufficiency with decreased urine output.  RENAL/URINARY TRACT ULTRASOUND COMPLETE  Comparison:  CT 06/26/2011  Findings:  Right Kidney:  12.9 cm. No hydronephrosis.  Small renal calculi identified. Normal renal cortical thickness and echogenicity.  Left Kidney:  11.9 cm. No hydronephrosis.  Small renal calculi identified. Normal renal cortical thickness  and echogenicity.  Bladder:  Collapsed around a Foley catheter.  IMPRESSION: No hydronephrosis.  Renal collecting system calculi, as before.   Original Report Authenticated By: Jeronimo Greaves, M.D.    Mr Foot Right Wo Contrast  02/17/2012  *RADIOLOGY REPORT*  Clinical Data: Necrotic second toe.  MRI OF THE RIGHT FOREFOOT WITHOUT CONTRAST  Technique:  Multiplanar, multisequence MR imaging was performed. No intravenous contrast was administered.  Comparison: Radiographs  02/17/2012.  Findings: Abnormal signal intensity in the distal phalanx of the second toe suspicious for osteomyelitis.  No findings to suggest septic arthritis.  There is mild diffuse cellulitis and diffuse myofasciitis.  No focal soft tissue abscess.  IMPRESSION:  1.  Osteomyelitis involving the distal phalanx of the second toe. 2.  Cellulitis and myofasciitis without focal soft tissue abscess.   Original Report Authenticated By: Rudie Meyer, M.D.    Dg Foot 2 Views Right  02/17/2012  *RADIOLOGY REPORT*  Clinical Data: Necrotic second toe right foot.  History of diabetes.  Staph bacteremia.  RIGHT FOOT - 2 VIEW  Comparison: None.  Findings: Underlying renal osteodystrophy with vascular calcifications.  Only AP and lateral radiographs performed; no oblique imaging.  Overlap of the digits on the lateral view. Question soft tissue ulcer about the lateral aspect of the second toe distally on AP view.  No gross osseous destruction.  No callus deposition.  IMPRESSION: Limited 2 view study, demonstrating no gross osseous destruction. Possible soft tissue defect about the second toe laterally.  If ongoing clinical concern, consider contrast enhanced MRI.   Original Report Authenticated By: Jeronimo Greaves, M.D.     Scheduled Meds:   . albuterol  2.5 mg Nebulization Once  . albuterol  2.5 mg Nebulization TID  . antiseptic oral rinse  15 mL Mouth Rinse q12n4p  . aspirin EC  325 mg Oral Daily  . brimonidine  1 drop Left Eye BID  . brinzolamide  1 drop Left Eye BID  . carvedilol  3.125 mg Oral Q breakfast  . chlorhexidine  15 mL Mouth/Throat BID  . docusate sodium  100 mg Oral BID  . gabapentin  100 mg Oral QHS  . insulin aspart  0-15 Units Subcutaneous TID WC  . insulin aspart  0-5 Units Subcutaneous QHS  . insulin glargine  10 Units Subcutaneous QHS  . ipratropium  0.5 mg Nebulization TID  . metoCLOPramide  5 mg Oral TID WC  . multivitamin with minerals  1 tablet Oral Daily  . sodium chloride  3 mL  Intravenous Q12H  . sodium chloride  3 mL Intravenous Q12H     Time spent: 35 minutes    [name]  [facility] Pager [phone]. If 8PM-8AM, please contact night-coverage at [email or web address], password [facility] 02/18/2012, 5:42 PM  LOS: 4 days

## 2012-02-18 NOTE — Anesthesia Postprocedure Evaluation (Signed)
Anesthesia Post Note  Patient: [name]  Procedure(s) Performed: Procedure(s) (LRB): AMPUTATION RAY (Right)  Anesthesia type: General  Patient location: PACU  Post pain: Pain level controlled  Post assessment: Post-op Vital signs reviewed  Last Vitals:  Filed Vitals:   02/18/12 1211  BP:   Pulse:   Temp: 36.3 C  Resp:     Post vital signs: Reviewed  Level of consciousness: sedated  Complications: No apparent anesthesia complications

## 2012-02-18 NOTE — Progress Notes (Signed)
SLP Cancellation Note  Patient Details Name: Delanda Bulluck MRN: [id number] DOB: November 02, 1940   Cancelled treatment:       Reason Eval/Treat Not Completed: Patient at procedure or test/unavailable  Pt NPO for procedure for gangergous toe.     Donavan Burnet, MS [facility] SLP [phone]

## 2012-02-18 NOTE — Progress Notes (Signed)
Pt's IV infiltrated.  This RN attempted unsuccessfully x 1.  IV team notified and IV nurse attempted unsuccessfully x 3.  No IV access at this time.  On call notified.  Lasix IV changed to PO.  Will pass information along. Ivery Quale 02/17/12 2300

## 2012-02-19 ENCOUNTER — Inpatient Hospital Stay (HOSPITAL_COMMUNITY): Payer: Medicare Other

## 2012-02-19 ENCOUNTER — Encounter (HOSPITAL_COMMUNITY): Payer: Self-pay | Admitting: Orthopedic Surgery

## 2012-02-19 LAB — URINE MICROSCOPIC-ADD ON

## 2012-02-19 LAB — BASIC METABOLIC PANEL
BUN: 79 mg/dL — ABNORMAL HIGH (ref 6–23)
Calcium: 8.9 mg/dL (ref 8.4–10.5)
Chloride: 96 mEq/L (ref 96–112)
Creatinine, Ser: 2.68 mg/dL — ABNORMAL HIGH (ref 0.50–1.10)
GFR calc Af Amer: 20 mL/min — ABNORMAL LOW (ref >90)
GFR calc non Af Amer: 17 mL/min — ABNORMAL LOW (ref >90)

## 2012-02-19 LAB — GLUCOSE, CAPILLARY: Glucose-Capillary: 96 mg/dL (ref 70–99)

## 2012-02-19 LAB — URINALYSIS, ROUTINE W REFLEX MICROSCOPIC
Bilirubin Urine: NEGATIVE
Glucose, UA: NEGATIVE mg/dL
Ketones, ur: NEGATIVE mg/dL
Protein, ur: NEGATIVE mg/dL
pH: 5 (ref 5.0–8.0)

## 2012-02-19 LAB — PROTEIN / CREATININE RATIO, URINE
Creatinine, Urine: 68.87 mg/dL
Total Protein, Urine: 46.5 mg/dL

## 2012-02-19 LAB — VANCOMYCIN, RANDOM: Vancomycin Rm: 32.8 ug/mL

## 2012-02-19 MED ORDER — FUROSEMIDE 10 MG/ML IJ SOLN
80.0000 mg | Freq: Two times a day (BID) | INTRAMUSCULAR | Status: AC
  Administered 2012-02-19 – 2012-02-21 (×4): 80 mg via INTRAVENOUS
  Filled 2012-02-19 (×4): qty 8

## 2012-02-19 MED ORDER — FUROSEMIDE 10 MG/ML IJ SOLN
60.0000 mg | Freq: Once | INTRAMUSCULAR | Status: AC
  Administered 2012-02-19: 60 mg via INTRAVENOUS
  Filled 2012-02-19: qty 6

## 2012-02-19 NOTE — Progress Notes (Addendum)
[facility] PROGRESS NOTE  [name] RUE:[id number] DOB: February 27, 1940 DOA: 02/14/2012 PCP: No primary provider on file.  Brief narrative  72 year old female resident of skilled nursing facility with history of diabetes, hypertension, blindness both eyes, history of TIA and history of dysphagia? Esophageal dilatation done in 2001 was brought to the ED with hypoxia significant leukocytosis. Chest x-ray showed bilateral airspace disease concerning for pneumonia.   Assessment/Plan:  #1 healthcare associated pneumonia  Given significant hypoxia , tachycardia and leukocytosis patient was admitted to step down monitoring. Patient started on empiric antibiotic IV vancomycin and IV cefazolin. Blood cultures sent on admission growing MRSA on both bottles.  -patient remains afebrile. Leukocytosis slowly improving. switched cefazolin to IV cefepime and IV Levaquin as well for broader coverage.  -Follow WBC and temperature curve.  -Flu negative   MRSA bacteremia  -no clear source except necrotic right 2nd toe which was amputated on 1/20. 2D echo shows no vegetations. on vancomycin. ( currently held for supratherapeutic levels) .Appreciate ID recs. Has a necrotic right second toe. MRI of the foot showed osteomyelitis. Seen by orthopedic consult and had amputation of the right second toe done on 1/20 and tolerated procedure well. ID recommended for 6-8 weeks abx. PICC ordered.  Acute on Chronic kidney disease  Patient has a oliguria with elevated creatinine. FeNa calculated was less than 1. Not improved with IV hydration. Renal ultrasound unremarkable. Repeated proBNP  was markedly elevated concerning for volume overload. Chest x-ray obtained showed concerning for a mild congestive heart failure. 2D echo on admission showed mild diastolic dysfn.  Given 2 doses of IV lasix yesterday after which she made 550 cc urine. Creatinine further worse this am. Ordered 60 mg IV lasix. -held vancomycin given  elevated levels. Doubt this is related to vancomycin toxicity. Avoid NSAIDs nephrotoxic agents.  renal consult called and will follow.  Acute respiratory failure  In the setting of pneumonia. currently stable   Diabetes mellitus  Continue sliding scale insulin and Lantus.   Hypertension  Currently stable. Continue home medications   Dysphagia  Seen by speech and swallow and MBS done today which showed esophageal dysphasia. Patient informs having esophagial dilatation done in 2001 by Dr. Arlyce Dice. [facility] consulted. Recommend standard barium swallow before deciding on EGD.  On dys level 1 diet    Code Status:full Code  Family Communication: none at bedside . Patient's legal guardian [name] was called and updated plan on 1/21 Disposition Plan: back to SNF once stable . Ordered PT eval    Consultants:  [facility]  ID  [facility]   Procedures:  Modified barium swallow ( 1/18)  Right second toe amputation on 1/20  Standard barium swallow ordered    Antibiotics:  IV vanco , zosyn and Levaquin ( 1/17>>)  HPI/Subjective:  Had amputation of  right 2 nd toe on 1/20 which she tolerated well. Still oliguric. Given 4o mg IV lasix x 2 on 1/20 with better UOP in the evening. Complains of some pain over the surgical toe.    Objective: Filed Vitals:   02/18/12 1952 02/18/12 2043 02/19/12 0548 02/19/12 0802  BP:  146/66 140/60   Pulse:  72 70   Temp:  97.2 F (36.2 C) 97.9 F (36.6 C)   TempSrc:  Oral Oral   Resp:  16 18   Height:      Weight:   97.8 kg (215 lb 9.8 oz)   SpO2: 92% 98% 94% 93%    Intake/Output Summary (Last  24 hours) at 02/19/12 1142 Last data filed at 02/19/12 0757  Gross per 24 hour  Intake   1980 ml  Output    665 ml  Net   1315 ml   Filed Weights   02/16/12 1800 02/17/12 0604 02/19/12 0548  Weight: 90.8 kg (200 lb 2.8 oz) 92.2 kg (203 lb 4.2 oz) 97.8 kg (215 lb 9.8 oz)    Exam:  General: Elderly female lying in bed in NAD    Cardiovascular: N S1&S2, no murmurs  Respiratory: clear b/l scattered rhonchi  Abdomen: soft, NT, ND BS+ Ext: warm, trace edema, dressing over amputated right 2nd toe intact.  CNS: AAOX3, bilateral blindness   Data Reviewed: Basic Metabolic Panel:  Lab 02/19/12 1610 02/18/12 0849 02/16/12 0658 02/15/12 0345 02/14/12 1045  NA 133* 131* 131* 135 138  K 3.9 3.6 4.0 3.4* 4.6  CL 96 93* 94* 98 97  CO2 20 20 23 24 22   GLUCOSE 109* 116* 203* 160* 96  BUN 79* 79* 74* 72* 71*  CREATININE 2.68* 2.57* 1.73* 1.48* 1.48*  CALCIUM 8.9 9.0 8.8 9.0 9.1  MG -- -- -- -- --  PHOS -- -- -- -- --   Liver Function Tests:  Lab 02/14/12 1045  AST 40*  ALT 24  ALKPHOS 174*  BILITOT 0.8  PROT 7.8  ALBUMIN 2.4*   No results found for this basename: LIPASE:5,AMYLASE:5 in the last 168 hours  Lab 02/14/12 1045  AMMONIA 32   CBC:  Lab 02/16/12 0629 02/15/12 0345 02/14/12 1045  WBC 19.9* 21.2* 26.9*  NEUTROABS -- -- 24.7*  HGB 10.4* 10.3* 10.4*  HCT 32.0* 31.1* 32.2*  MCV 83.8 83.8 86.1  PLT 343 374 385   Cardiac Enzymes:  Lab 02/14/12 1045  CKTOTAL --  CKMB --  CKMBINDEX --  TROPONINI <0.30   BNP (last 3 results)  Basename 02/17/12 1844 02/14/12 1045 06/23/11 0900  PROBNP 14005.0* 7013.0* 1570.0*   CBG:  Lab 02/19/12 0756 02/18/12 2143 02/18/12 1749 02/18/12 1229 02/18/12 0735  GLUCAP 96 99 175* 94 111*    Recent Results (from the past 240 hour(s))  CULTURE, BLOOD (ROUTINE X 2)     Status: Normal   Collection Time   02/14/12 10:35 AM      Component Value Range Status Comment   Specimen Description BLOOD RIGHT HAND   Final    Special Requests BOTTLES DRAWN AEROBIC ONLY   Final    Culture  Setup Time 02/14/2012 14:49   Final    Culture     Final    Value: METHICILLIN RESISTANT STAPHYLOCOCCUS AUREUS     Note: RIFAMPIN AND GENTAMICIN SHOULD NOT BE USED AS SINGLE DRUGS FOR TREATMENT OF STAPH INFECTIONS. CRITICAL RESULT CALLED TO, READ BACK BY AND VERIFIED WITH: BETHANY  STRINGER 02/16/12 @ 8:22PM BY RUSCA.     Note: Gram Stain Report Called to,Read Back By and Verified With: JESSICA CONRAD 02/15/12 1440 BY SMITHERSJ   Report Status 02/17/2012 FINAL   Final    Organism ID, Bacteria METHICILLIN RESISTANT STAPHYLOCOCCUS AUREUS   Final   CULTURE, BLOOD (ROUTINE X 2)     Status: Normal   Collection Time   02/14/12 10:45 AM      Component Value Range Status Comment   Specimen Description BLOOD RIGHT FOREARM   Final    Special Requests BOTTLES DRAWN AEROBIC ONLY   Final    Culture  Setup Time 02/14/2012 14:49   Final    Culture  Final    Value: METHICILLIN RESISTANT STAPHYLOCOCCUS AUREUS     Note: SUSCEPTIBILITIES PERFORMED ON PREVIOUS CULTURE WITHIN THE LAST 5 DAYS. CRITICAL RESULT CALLED TO, READ BACK BY AND VERIFIED WITH: BETHANY STRINGER 02/16/12 @ 8:22PM BY RUSCA.     Note: CRITICAL RESULT CALLED TO, READ BACK BY AND VERIFIED WITH: MILLIE PRICE 02/16/12 @ 10:55AM BY RUSCA.   Report Status 02/17/2012 FINAL   Final   URINE CULTURE     Status: Normal   Collection Time   02/14/12 10:46 AM      Component Value Range Status Comment   Specimen Description URINE, CATHETERIZED   Final    Special Requests NONE   Final    Culture  Setup Time 02/14/2012 18:58   Final    Colony Count NO GROWTH   Final    Culture NO GROWTH   Final    Report Status 02/15/2012 FINAL   Final   MRSA PCR SCREENING     Status: Normal   Collection Time   02/14/12  3:08 PM      Component Value Range Status Comment   MRSA by PCR NEGATIVE  NEGATIVE Final   CULTURE, BLOOD (ROUTINE X 2)     Status: Normal (Preliminary result)   Collection Time   02/16/12  4:30 PM      Component Value Range Status Comment   Specimen Description BLOOD RIGHT HAND  3 ML IN [facility] BOTTLE   Final    Special Requests NONE   Final    Culture  Setup Time 02/16/2012 23:17   Final    Culture     Final    Value:        BLOOD CULTURE RECEIVED NO GROWTH TO DATE CULTURE WILL BE HELD FOR 5 DAYS BEFORE ISSUING A FINAL  NEGATIVE REPORT   Report Status PENDING   Incomplete   CULTURE, BLOOD (ROUTINE X 2)     Status: Normal (Preliminary result)   Collection Time   02/16/12  4:48 PM      Component Value Range Status Comment   Specimen Description BLOOD RIGHT ARM  2 ML IN [facility] BOTTLE   Final    Special Requests NONE   Final    Culture  Setup Time 02/16/2012 23:16   Final    Culture     Final    Value:        BLOOD CULTURE RECEIVED NO GROWTH TO DATE CULTURE WILL BE HELD FOR 5 DAYS BEFORE ISSUING A FINAL NEGATIVE REPORT   Report Status PENDING   Incomplete   SURGICAL PCR SCREEN     Status: Normal   Collection Time   02/18/12  5:25 AM      Component Value Range Status Comment   MRSA, PCR NEGATIVE  NEGATIVE Final    Staphylococcus aureus NEGATIVE  NEGATIVE Final      Studies: Dg Chest 2 View  02/17/2012  *RADIOLOGY REPORT*  Clinical Data: Increased cough and shortness of breath.  Question fluid overload.  CHEST - 2 VIEW  Comparison: Chest radiograph 02/15/2012.  Findings: Stable cardiomegaly.  There is diffuse pulmonary vascular congestion with interstitial prominence in the perihilar regions of lung bases.  Patchy airspace opacities in the left mid lung.  Small bilateral pleural effusions.  Thickening of the fissures on the lateral view.  IMPRESSION: Mild congestive heart failure pattern with small bilateral pleural effusions.   Original Report Authenticated By: Britta Mccreedy, M.D.    US Renal  02/17/2012  *  RADIOLOGY REPORT*  Clinical Data: Acute renal insufficiency with decreased urine output.  RENAL/URINARY TRACT ULTRASOUND COMPLETE  Comparison:  CT 06/26/2011  Findings:  Right Kidney:  12.9 cm. No hydronephrosis.  Small renal calculi identified. Normal renal cortical thickness and echogenicity.  Left Kidney:  11.9 cm. No hydronephrosis.  Small renal calculi identified. Normal renal cortical thickness and echogenicity.  Bladder:  Collapsed around a Foley catheter.  IMPRESSION: No hydronephrosis.  Renal collecting  system calculi, as before.   Original Report Authenticated By: Jeronimo Greaves, M.D.    Mr Foot Right Wo Contrast  02/17/2012  *RADIOLOGY REPORT*  Clinical Data: Necrotic second toe.  MRI OF THE RIGHT FOREFOOT WITHOUT CONTRAST  Technique:  Multiplanar, multisequence MR imaging was performed. No intravenous contrast was administered.  Comparison: Radiographs 02/17/2012.  Findings: Abnormal signal intensity in the distal phalanx of the second toe suspicious for osteomyelitis.  No findings to suggest septic arthritis.  There is mild diffuse cellulitis and diffuse myofasciitis.  No focal soft tissue abscess.  IMPRESSION:  1.  Osteomyelitis involving the distal phalanx of the second toe. 2.  Cellulitis and myofasciitis without focal soft tissue abscess.   Original Report Authenticated By: Rudie Meyer, M.D.     Scheduled Meds:   . albuterol  2.5 mg Nebulization Once  . albuterol  2.5 mg Nebulization TID  . antiseptic oral rinse  15 mL Mouth Rinse q12n4p  . aspirin EC  325 mg Oral Daily  . brimonidine  1 drop Left Eye BID  . brinzolamide  1 drop Left Eye BID  . carvedilol  3.125 mg Oral Q breakfast  . chlorhexidine  15 mL Mouth/Throat BID  . docusate sodium  100 mg Oral BID  . gabapentin  100 mg Oral QHS  . insulin aspart  0-15 Units Subcutaneous TID WC  . insulin aspart  0-5 Units Subcutaneous QHS  . insulin glargine  10 Units Subcutaneous QHS  . ipratropium  0.5 mg Nebulization TID  . metoCLOPramide  5 mg Oral TID WC  . multivitamin with minerals  1 tablet Oral Daily  . sodium chloride  3 mL Intravenous Q12H  . sodium chloride  3 mL Intravenous Q12H   Continuous Infusions:    Time spent: 35 minutes    Donnavan Covault  [facility] Pager [phone]. If 8PM-8AM, please contact night-coverage at [email or web address], password [facility] 02/19/2012, 11:42 AM  LOS: 5 days

## 2012-02-19 NOTE — Progress Notes (Addendum)
[facility]  Date of Admission:  02/14/2012  Antibiotics: Vancomycin day 6 (currently held for supratherapeutic level)  Subjective: No complaints, no fever, some chills  Objective: Temp:  [97.2 F (36.2 C)-97.9 F (36.6 C)] 97.9 F (36.6 C) (01/21 0548) Pulse Rate:  [70-72] 70  (01/21 0548) Resp:  [16-18] 18  (01/21 0548) BP: (140-146)/(60-66) 140/60 mmHg (01/21 0548) SpO2:  [92 %-100 %] 93 % (01/21 0802) Weight:  [215 lb 9.8 oz (97.8 kg)] 215 lb 9.8 oz (97.8 kg) (01/21 0548)  General: Awake, alert, nad Skin: no rashes Lungs: CTA B Cor: RRR without m/r/g Abdomen: soft, ntnd, +bs Ext: wrapped foot  Lab Results Lab Results  Component Value Date   WBC 19.9* 02/16/2012   HGB 10.4* 02/16/2012   HCT 32.0* 02/16/2012   MCV 83.8 02/16/2012   PLT 343 02/16/2012    Lab Results  Component Value Date   CREATININE 2.68* 02/19/2012   BUN 79* 02/19/2012   NA 133* 02/19/2012   K 3.9 02/19/2012   CL 96 02/19/2012   CO2 20 02/19/2012    Lab Results  Component Value Date   ALT 24 02/14/2012   AST 40* 02/14/2012   ALKPHOS 174* 02/14/2012   BILITOT 0.8 02/14/2012      Microbiology: Recent Results (from the past 240 hour(s))  CULTURE, BLOOD (ROUTINE X 2)     Status: Normal   Collection Time   02/14/12 10:35 AM      Component Value Range Status Comment   Specimen Description BLOOD RIGHT HAND   Final    Special Requests BOTTLES DRAWN AEROBIC ONLY   Final    Culture  Setup Time 02/14/2012 14:49   Final    Culture     Final    Value: METHICILLIN RESISTANT STAPHYLOCOCCUS AUREUS     Note: RIFAMPIN AND GENTAMICIN SHOULD NOT BE USED AS SINGLE DRUGS FOR TREATMENT OF STAPH INFECTIONS. CRITICAL RESULT CALLED TO, READ BACK BY AND VERIFIED WITH: BETHANY STRINGER 02/16/12 @ 8:22PM BY RUSCA.     Note: Gram Stain Report Called to,Read Back By and Verified With: JESSICA CONRAD 02/15/12 1440 BY SMITHERSJ   Report Status 02/17/2012 FINAL   Final    Organism ID, Bacteria  METHICILLIN RESISTANT STAPHYLOCOCCUS AUREUS   Final   CULTURE, BLOOD (ROUTINE X 2)     Status: Normal   Collection Time   02/14/12 10:45 AM      Component Value Range Status Comment   Specimen Description BLOOD RIGHT FOREARM   Final    Special Requests BOTTLES DRAWN AEROBIC ONLY   Final    Culture  Setup Time 02/14/2012 14:49   Final    Culture     Final    Value: METHICILLIN RESISTANT STAPHYLOCOCCUS AUREUS     Note: SUSCEPTIBILITIES PERFORMED ON PREVIOUS CULTURE WITHIN THE LAST 5 DAYS. CRITICAL RESULT CALLED TO, READ BACK BY AND VERIFIED WITH: BETHANY STRINGER 02/16/12 @ 8:22PM BY RUSCA.     Note: CRITICAL RESULT CALLED TO, READ BACK BY AND VERIFIED WITH: MILLIE PRICE 02/16/12 @ 10:55AM BY RUSCA.   Report Status 02/17/2012 FINAL   Final   URINE CULTURE     Status: Normal   Collection Time   02/14/12 10:46 AM      Component Value Range Status Comment   Specimen Description URINE, CATHETERIZED   Final    Special Requests NONE   Final    Culture  Setup Time 02/14/2012 18:58  Final    Colony Count NO GROWTH   Final    Culture NO GROWTH   Final    Report Status 02/15/2012 FINAL   Final   MRSA PCR SCREENING     Status: Normal   Collection Time   02/14/12  3:08 PM      Component Value Range Status Comment   MRSA by PCR NEGATIVE  NEGATIVE Final   CULTURE, BLOOD (ROUTINE X 2)     Status: Normal (Preliminary result)   Collection Time   02/16/12  4:30 PM      Component Value Range Status Comment   Specimen Description BLOOD RIGHT HAND  3 ML IN [facility] BOTTLE   Final    Special Requests NONE   Final    Culture  Setup Time 02/16/2012 23:17   Final    Culture     Final    Value:        BLOOD CULTURE RECEIVED NO GROWTH TO DATE CULTURE WILL BE HELD FOR 5 DAYS BEFORE ISSUING A FINAL NEGATIVE REPORT   Report Status PENDING   Incomplete   CULTURE, BLOOD (ROUTINE X 2)     Status: Normal (Preliminary result)   Collection Time   02/16/12  4:48 PM      Component Value Range Status Comment   Specimen  Description BLOOD RIGHT ARM  2 ML IN [facility] BOTTLE   Final    Special Requests NONE   Final    Culture  Setup Time 02/16/2012 23:16   Final    Culture     Final    Value:        BLOOD CULTURE RECEIVED NO GROWTH TO DATE CULTURE WILL BE HELD FOR 5 DAYS BEFORE ISSUING A FINAL NEGATIVE REPORT   Report Status PENDING   Incomplete   SURGICAL PCR SCREEN     Status: Normal   Collection Time   02/18/12  5:25 AM      Component Value Range Status Comment   MRSA, PCR NEGATIVE  NEGATIVE Final    Staphylococcus aureus NEGATIVE  NEGATIVE Final     Studies/Results: Dg Chest 2 View  02/17/2012  *RADIOLOGY REPORT*  Clinical Data: Increased cough and shortness of breath.  Question fluid overload.  CHEST - 2 VIEW  Comparison: Chest radiograph 02/15/2012.  Findings: Stable cardiomegaly.  There is diffuse pulmonary vascular congestion with interstitial prominence in the perihilar regions of lung bases.  Patchy airspace opacities in the left mid lung.  Small bilateral pleural effusions.  Thickening of the fissures on the lateral view.  IMPRESSION: Mild congestive heart failure pattern with small bilateral pleural effusions.   Original Report Authenticated By: Britta Mccreedy, M.D.    US Renal  02/17/2012  *RADIOLOGY REPORT*  Clinical Data: Acute renal insufficiency with decreased urine output.  RENAL/URINARY TRACT ULTRASOUND COMPLETE  Comparison:  CT 06/26/2011  Findings:  Right Kidney:  12.9 cm. No hydronephrosis.  Small renal calculi identified. Normal renal cortical thickness and echogenicity.  Left Kidney:  11.9 cm. No hydronephrosis.  Small renal calculi identified. Normal renal cortical thickness and echogenicity.  Bladder:  Collapsed around a Foley catheter.  IMPRESSION: No hydronephrosis.  Renal collecting system calculi, as before.   Original Report Authenticated By: Jeronimo Greaves, M.D.    Mr Foot Right Wo Contrast  02/17/2012  *RADIOLOGY REPORT*  Clinical Data: Necrotic second toe.  MRI OF THE RIGHT FOREFOOT  WITHOUT CONTRAST  Technique:  Multiplanar, multisequence MR imaging was performed. No intravenous contrast was  administered.  Comparison: Radiographs 02/17/2012.  Findings: Abnormal signal intensity in the distal phalanx of the second toe suspicious for osteomyelitis.  No findings to suggest septic arthritis.  There is mild diffuse cellulitis and diffuse myofasciitis.  No focal soft tissue abscess.  IMPRESSION:  1.  Osteomyelitis involving the distal phalanx of the second toe. 2.  Cellulitis and myofasciitis without focal soft tissue abscess.   Original Report Authenticated By: Rudie Meyer, M.D.     Assessment/Plan: 1) MRSA bacteremia - repeat cultures remain negative to date.  TTE 1/16 without any significant vegetation.   -she will need a PICC but due to renal disease, picc is not recommended. 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

## 2012-02-19 NOTE — Consult Note (Signed)
Reason for Consult TEE to rule out endocarditis Referring Physician: [facility] hospitalist  [name] is an 72 y.o. female.  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 Cardiologic consultation is called for transesophageal echocardiogram rule out evidence vegetations to help guide and the duration of treatment with antibiotics. Patient denies any chest pain shortness of breath at present. Denies any palpitation lightheadedness or syncope. Denies any fever or chills at present. Denies any rash or itching.   Past Medical History  Diagnosis Date  . Hypertension   . Diabetes mellitus   . Blindness   . TIA (transient ischemic attack)   . Anemia   . GERD (gastroesophageal reflux disease)   . Renal disorder   . Constipation     Past Surgical History  Procedure Date  . Gallbladder surgery   . Esophageal dilation   . Cholecystectomy   . Amputation 02/18/2012    Procedure: AMPUTATION RAY;  Surgeon: Kennieth Rad, MD;  Location: [facility];  Service: Orthopedics;  Laterality: Right;  right second toe    History reviewed. No pertinent family history.  Social History:  reports that she has never smoked. Her smokeless tobacco use includes Snuff. She reports that she does not drink alcohol or use illicit drugs.  Allergies: No Known Allergies  Medications: I have reviewed the patient's current medications.  Results for orders placed during the hospital encounter of 02/14/12 (from  the past 48 hour(s))  PRO B NATRIURETIC PEPTIDE     Status: Abnormal   Collection Time   02/17/12  6:44 PM      Component Value Range Comment   Pro B Natriuretic peptide (BNP) 14005.0 (*) 0 - 125 pg/mL   GLUCOSE, CAPILLARY     Status: Abnormal   Collection Time   02/17/12  8:46 PM      Component Value Range Comment   Glucose-Capillary 113 (*) 70 - 99 mg/dL    Comment 1 Notify RN     PROCALCITONIN     Status: Normal   Collection Time   02/18/12  4:12 AM      Component Value Range Comment   Procalcitonin 0.73     SURGICAL PCR SCREEN     Status: Normal   Collection Time   02/18/12  5:25 AM      Component Value Range Comment   MRSA, PCR NEGATIVE  NEGATIVE    Staphylococcus aureus NEGATIVE  NEGATIVE   GLUCOSE, CAPILLARY     Status: Abnormal   Collection Time   02/18/12  7:35 AM      Component Value Range Comment   Glucose-Capillary 111 (*) 70 - 99 mg/dL   VANCOMYCIN, TROUGH     Status: Abnormal   Collection Time   02/18/12  8:48 AM      Component Value Range Comment   Vancomycin Tr 35.2 (*) 10.0 - 20.0 ug/mL   BASIC METABOLIC PANEL     Status: Abnormal   Collection Time   02/18/12  8:49 AM      Component Value Range Comment  Sodium 131 (*) 135 - 145 mEq/L    Potassium 3.6  3.5 - 5.1 mEq/L    Chloride 93 (*) 96 - 112 mEq/L    CO2 20  19 - 32 mEq/L    Glucose, Bld 116 (*) 70 - 99 mg/dL    BUN 79 (*) 6 - 23 mg/dL    Creatinine, Ser 9.14 (*) 0.50 - 1.10 mg/dL    Calcium 9.0  8.4 - 78.2 mg/dL    GFR calc non Af Amer 18 (*) >90 mL/min    GFR calc Af Amer 21 (*) >90 mL/min   GLUCOSE, CAPILLARY     Status: Normal   Collection Time   02/18/12 12:29 PM      Component Value Range Comment   Glucose-Capillary 94  70 - 99 mg/dL   GLUCOSE, CAPILLARY     Status: Abnormal   Collection Time   02/18/12  5:49 PM      Component Value Range Comment   Glucose-Capillary 175 (*) 70 - 99 mg/dL   GLUCOSE, CAPILLARY     Status: Normal   Collection Time   02/18/12  9:43 PM      Component Value Range  Comment   Glucose-Capillary 99  70 - 99 mg/dL    Comment 1 Notify RN     GLUCOSE, CAPILLARY     Status: Normal   Collection Time   02/19/12  7:56 AM      Component Value Range Comment   Glucose-Capillary 96  70 - 99 mg/dL    Comment 1 Documented in Chart      Comment 2 Notify RN     VANCOMYCIN, RANDOM     Status: Normal   Collection Time   02/19/12  8:30 AM      Component Value Range Comment   Vancomycin Rm 32.8     BASIC METABOLIC PANEL     Status: Abnormal   Collection Time   02/19/12  8:30 AM      Component Value Range Comment   Sodium 133 (*) 135 - 145 mEq/L    Potassium 3.9  3.5 - 5.1 mEq/L    Chloride 96  96 - 112 mEq/L    CO2 20  19 - 32 mEq/L    Glucose, Bld 109 (*) 70 - 99 mg/dL    BUN 79 (*) 6 - 23 mg/dL    Creatinine, Ser 9.56 (*) 0.50 - 1.10 mg/dL    Calcium 8.9  8.4 - 21.3 mg/dL    GFR calc non Af Amer 17 (*) >90 mL/min    GFR calc Af Amer 20 (*) >90 mL/min   URINALYSIS, ROUTINE W REFLEX MICROSCOPIC     Status: Abnormal   Collection Time   02/19/12 10:40 AM      Component Value Range Comment   Color, Urine YELLOW  YELLOW    APPearance TURBID (*) CLEAR    Specific Gravity, Urine 1.021  1.005 - 1.030    pH 5.0  5.0 - 8.0    Glucose, UA NEGATIVE  NEGATIVE mg/dL    Hgb urine dipstick LARGE (*) NEGATIVE    Bilirubin Urine NEGATIVE  NEGATIVE    Ketones, ur NEGATIVE  NEGATIVE mg/dL    Protein, ur NEGATIVE  NEGATIVE mg/dL    Urobilinogen, UA 0.2  0.0 - 1.0 mg/dL    Nitrite NEGATIVE  NEGATIVE    Leukocytes, UA LARGE (*) NEGATIVE   URINE MICROSCOPIC-ADD ON     Status: Abnormal  Collection Time   02/19/12 10:40 AM      Component Value Range Comment   Squamous Epithelial / LPF FEW (*) RARE    WBC, UA TOO NUMEROUS TO COUNT  <3 WBC/hpf    RBC / HPF 11-20  <3 RBC/hpf    Bacteria, UA FEW (*) RARE    Urine-Other FEW YEAST     GLUCOSE, CAPILLARY     Status: Abnormal   Collection Time   02/19/12 12:32 PM      Component Value Range Comment   Glucose-Capillary 105 (*) 70  - 99 mg/dL    Comment 1 Documented in Chart      Comment 2 Notify RN     PROTEIN / CREATININE RATIO, URINE     Status: Abnormal   Collection Time   02/19/12  3:09 PM      Component Value Range Comment   Creatinine, Urine 68.87      Total Protein, Urine 46.5   NO NORMAL RANGE ESTABLISHED FOR THIS TEST   PROTEIN CREATININE RATIO 0.68 (*) 0.00 - 0.15   GLUCOSE, CAPILLARY     Status: Abnormal   Collection Time   02/19/12  5:29 PM      Component Value Range Comment   Glucose-Capillary 107 (*) 70 - 99 mg/dL    Comment 1 Documented in Chart      Comment 2 Notify RN       Dg Chest 2 View  02/19/2012  *RADIOLOGY REPORT*  Clinical Data: Weakness.  Evaluate for pulmonary edema  CHEST - 2 VIEW  Comparison: None 02/17/2012  Findings: This examination is technically limited by body habitus and patient positioning (semiupright) Cardiomegaly is stable. There is bilateral pulmonary vascular congestion.  There is a patchy left retrocardiac opacity.  Hazy opacity the right lung base may be due to overlying soft tissues of the breast. On the lateral view, the posterior costophrenic angles are obscured by the table, as the patient was in semiupright position.  IMPRESSION: Cardiomegaly and mild pulmonary vascular congestion. Image detail limited by patient body habitus and positioning. Small left basilar opacity could be due to atelectasis or airspace disease. Small bilateral pleural effusions cannot be excluded, as described above.   Original Report Authenticated By: Britta Mccreedy, M.D.    Dg Chest 2 View  02/17/2012  *RADIOLOGY REPORT*  Clinical Data: Increased cough and shortness of breath.  Question fluid overload.  CHEST - 2 VIEW  Comparison: Chest radiograph 02/15/2012.  Findings: Stable cardiomegaly.  There is diffuse pulmonary vascular congestion with interstitial prominence in the perihilar regions of lung bases.  Patchy airspace opacities in the left mid lung.  Small bilateral pleural effusions.  Thickening  of the fissures on the lateral view.  IMPRESSION: Mild congestive heart failure pattern with small bilateral pleural effusions.   Original Report Authenticated By: Britta Mccreedy, M.D.    Dg Esophagus  02/19/2012  *RADIOLOGY REPORT*  Clinical Data: Dilated esophagus on modified barium swallow.  The patient reports no dysphagia or difficulty edema drinking.  ESOPHOGRAM/BARIUM SWALLOW  Technique:  Single contrast examination was performed using thin barium.  Fluoroscopy time:  2.3 minutes.  Comparison:  None.  Findings:  Exam is somewhat limited due to patient immobility. Patient had a toe amputation 1 day prior.  The esophagus is patulous.  There is significant esophageal dysmotility with tertiary contractions and to-and-fro motion of the barium bolus.  There is some mild irregularity at the GE junction with mild narrowing.  A 13 mm  barium tablet did pass GE junction easily.  IMPRESSION:  1.  Significant esophageal dysmotility with tertiary contractions and to-and-fro motion of the barium bolus.  Patulous esophagus.  2.  Some mild stricturing of the distal esophagus.  Cannot exclude a mucosal irregularity. 3.  Barium tablet passed through the GE junction easily.   Original Report Authenticated By: Genevive Bi, M.D.     Review of Systems  Constitutional: Negative for fever and chills.  Respiratory: Negative for cough, hemoptysis, sputum production and shortness of breath.   Cardiovascular: Negative for chest pain, palpitations, orthopnea, leg swelling and PND.  Gastrointestinal: Negative for nausea, vomiting, abdominal pain and diarrhea.  Skin: Negative for rash.  Neurological: Negative for headaches.   Blood pressure 129/53, pulse 65, temperature 97.8 F (36.6 C), temperature source Oral, resp. rate 18, height 5\' 2"  (1.575 m), weight 97.8 kg (215 lb 9.8 oz), SpO2 96.00%. Physical Exam  Constitutional: She is oriented to person, place, and time. She appears well-developed and well-nourished.  Neck:  Normal range of motion. Neck supple. No JVD present. No tracheal deviation present. Thyromegaly present.  Cardiovascular:       Regular rate and rhythm S1-S2 normal there is soft systolic murmur at left lower sternal border and apex. No S3 gallop no pericardial rub  Respiratory:        Clear to auscultation anterolaterally  GI: Soft. Bowel sounds are normal. She exhibits distension. There is no tenderness. There is no rebound.  Musculoskeletal: She exhibits no edema and no tenderness.       Surgical dressing noted right foot  Neurological: She is alert and oriented to person, place, and time.    Assessment/Plan: MRSA bacteremia probably secondary to necrotic right foot status post amputation Hypertension Diabetes mellitus Diabetic retinopathy with blindness Degenerative joint disease Anemia of chronic disease Acute on chronic kidney injury Plan Continue present management Will schedule for TEE in a.m.   Robynn Pane 02/19/2012, 6:04 PM

## 2012-02-19 NOTE — Progress Notes (Signed)
Patient underwent though amputation apparently causing delay her cancellation and her barium swallow that ordered. We'll reorder it based on abnormal modified barium swallow although the patient denies any dysphagia.

## 2012-02-19 NOTE — Progress Notes (Signed)
Subjective: 1 Day Post-Op Procedure(s) (LRB): AMPUTATION RAY (Right) Patient reports pain as 5 on 0-10 scale.    Objective: Vital signs in last 24 hours: Temp:  [97.2 F (36.2 C)-97.9 F (36.6 C)] 97.8 F (36.6 C) (01/21 1450) Pulse Rate:  [65-72] 65  (01/21 1450) Resp:  [16-18] 18  (01/21 1450) BP: (129-146)/(53-66) 129/53 mmHg (01/21 1450) SpO2:  [92 %-98 %] 96 % (01/21 1450) Weight:  [97.8 kg (215 lb 9.8 oz)] 97.8 kg (215 lb 9.8 oz) (01/21 0548)  Intake/Output from previous day: 01/20 0701 - 01/21 0700 In: 1740 [P.O.:540; I.V.:950; IV Piggyback:250] Out: 665 [Urine:650; Blood:15] Intake/Output this shift:    No results found for this basename: HGB:5 in the last 72 hours No results found for this basename: WBC:2,RBC:2,HCT:2,PLT:2 in the last 72 hours  Basename 02/19/12 0830 02/18/12 0849  NA 133* 131*  K 3.9 3.6  CL 96 93*  CO2 20 20  BUN 79* 79*  CREATININE 2.68* 2.57*  GLUCOSE 109* 116*  CALCIUM 8.9 9.0   No results found for this basename: LABPT:2,INR:2 in the last 72 hours  Neurovascular intact Dorsiflexion/Plantar flexion intact  Assessment/Plan: 1 Day Post-Op Procedure(s) (LRB): AMPUTATION RAY (Right) Up with therapy  [name] F 02/19/2012, 7:38 PM

## 2012-02-19 NOTE — Progress Notes (Signed)
ANTIBIOTIC CONSULT NOTE - FOLLOW UP  Pharmacy Consult for Vanc Indication: MRSA bacteremia/necrotic right toe amputation 1/20  No Known Allergies  Patient Measurements: Height: 5\' 2"  (157.5 cm) Weight: 215 lb 9.8 oz (97.8 kg) IBW/kg (Calculated) : 50.1    Labs:  Basename 02/19/12 0830 02/18/12 0849  WBC -- --  HGB -- --  PLT -- --  LABCREA -- --  CREATININE 2.68* 2.57*   Estimated Creatinine Clearance: 21.6 ml/min (by C-G formula based on Cr of 2.68).  Basename 02/19/12 0830 02/18/12 0848  VANCOTROUGH -- 35.2*  VANCOPEAK -- --  VANCORANDOM 32.8 --  GENTTROUGH -- --  GENTPEAK -- --  GENTRANDOM -- --  TOBRATROUGH -- --  TOBRAPEAK -- --  TOBRARND -- --  AMIKACINPEAK -- --  AMIKACINTROU -- --  AMIKACIN -- --     Assessment:  78 yof resident of [facility] presented 1/16 with c/o SOB. Flu test obtained at SNF and CXR negative for PNA per MD note.  Patient reported h/o recurrent PNA. Azith PO and Rocephin IM @ SNF started 1/15.  CXR here cannot r/o infectious process.   Today is day#6 vanc for MRSA bacteremia secondary to necrotic right toe for amputation today  Random vanc level still > 30 today - note that last dose of vanc 1250mg  was on 1/19 at 10am; however, a dose of 500mg  IV vanc was given by ortho prior to amputation at 11am yesterday likely contributing to vanc level still > 30 and continued rise in SCr  Goal of Therapy:  Vancomycin trough level 15-20 mcg/ml  Plan:  1) Continue to hold vancomycin 2) Recheck random vanc level in AM 1/22 to evaluate clearance without a dose given today   Hessie Knows, PharmD, BCPS Pager [phone] 02/19/2012 10:13 AM

## 2012-02-19 NOTE — Progress Notes (Signed)
Appreciate renal consult.  recommends against PICC given patient agrees on  HD if needed in future. Has a rt EJ . Discussed with ID. She will need vancomycin for MRSA bacteremia for 6-8 weeks. Recommend to get TEE to see for vegetations. If negative then it can be assumed that osteomyelitis of the toe was the source of bacteremia and since it is amputated we can shorten the duration of abx for 2 weeks only ( starting 1/20) since she has poor peripheral access and cannot get a PICC for long term abx. Returning to NH may not be an option.  Request for TEE made . Dr Sharyn Lull plans to get it done tomorrow. NPO after midnight.  -given her active renal dysfunction, i assume the patient will stay next few days. If IV access continues to be an issue and patient may need long term abx, will review if LTAC would be an option

## 2012-02-19 NOTE — Progress Notes (Signed)
Barium swallow reviewed: shows esophageal motility/tertiary contractions without ring or stricture or hangup of barium pill. There is no good treatment for esophageal dysmotility  and patient repeatedly denies dysphagia for now. Would advise whatever diet recommended  by Speech Pathology and will sign off for now.

## 2012-02-19 NOTE — Progress Notes (Signed)
PT Cancellation Note  Patient Details Name: [name] MRN: [id number] DOB: [birth year]   DISCONTINUE PT NOTE:    Reason Eval/Treat Not Completed:  (PT is not indicated in acute care. Pt. was total care for mobility and required mechanical lift PTA. PT is signing off.RN aware.   Rada Hay 02/19/2012, 10:58 [phone]

## 2012-02-19 NOTE — Consult Note (Signed)
Requesting Physician:  Dr. Gonzella Lex  Reason for Consult:  Acute kidney injury on chronic kidney disease  HPI: The patient is a 72 y.o. year-old female nursing home resident with a background history of DM, hypertension, CKD (baseline creatinine 0.7-0.8 until around May of 2013 at which time rose to 1.3-1.5 range  - circumstances of that rise not known) On review of records - dipstick proteinuria since 04/2009 variable - though no protein by dip this admit  She was admitted to the hospital 1/16 with leukocytosis, question of HCAP. Creatinine on admission was at baseline 1.48. 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    Treated with  vanco (with supratherapeutic level of 32) and zosyn as well as a dose of levaquin  She received IVF for a low urine output and low FENA. Remained oliguric, CXR with CHF, BNP elevated Creatinine rising 1.4 to 1.7 to 2.5 to 2.68 Given 2 doses of lasix yesterday with 550 cc uop but continued rise in creatinine No ACE or ARB On NSAIDS PTA [place] 12.9, 11.9 no hydro Transient hypotension ono 1/17 only   We are therefore called to see.  She presently is complaining of some foot pain. She feels a little short of breath when moving around in bed. Says "I know my kidneys will be shot someday"  Creatinine, Ser  Date/Time Value Range Status  02/19/2012  8:30 AM 2.68* 0.50 - 1.10 mg/dL Final  1/61/0960  4:54 AM 2.57* 0.50 - 1.10 mg/dL Final  0/98/1191  4:78 AM 1.73* 0.50 - 1.10 mg/dL Final  2/95/6213  0:86 AM 1.48* 0.50 - 1.10 mg/dL Final  5/78/4696 29:52 AM 1.48* 0.50 - 1.10 mg/dL Final  8/41/3244  0:10 AM 1.49* 0.50 - 1.10 mg/dL Final  2/72/5366  4:40 AM 1.52* 0.50 - 1.10 mg/dL Final  3/47/4259  5:63 AM 1.57* 0.50 - 1.10 mg/dL Final  8/75/6433  2:95 AM 1.46* 0.50 - 1.10 mg/dL Final  1/88/4166  0:63 AM 1.36* 0.50 - 1.10 mg/dL Final     DELTA  CHECK NOTED     REPEATED TO VERIFY  06/23/2011  9:00 AM 0.73  0.50 - 1.10 mg/dL Final  0/16/0109  3:23 PM 0.89  0.50 - 1.10 mg/dL Final  5/57/3220 25:42 PM 0.94  0.50 - 1.10 mg/dL Final  70/06/2374  2:83 PM 1.21* 0.50 - 1.10 mg/dL Final  1/51/7616  0:73 AM 0.95  0.4 - 1.2 mg/dL Final  08/08/6267  4:85 AM 0.9  0.4 - 1.2 mg/dL Final  4/62/7035 00:93 PM 0.96  0.4 - 1.2 mg/dL Final  08/29/8297  3:71 PM 1.38* 0.4 - 1.2 mg/dL Final  69/67/8938  1:01 AM 1.49* 0.4 - 1.2 mg/dL Final  75/10/2583  2:77 AM 1.1  0.4 - 1.2 mg/dL Final  82/04/2351  6:14 PM 1.02  0.40-1.20 mg/dL Final  05/02/1538  0:86 AM 0.95   Final  07/04/2007  3:30 AM 0.93   Final  07/03/2007  3:30 AM 1.18 DELTA CHECK NOTED   Final  07/01/2007 11:05 PM 1.88*  Final  05/12/2007  9:06 PM 1.04  0.40-1.20 mg/dL Final  76/19/5093  2:67 PM 0.80   Final    Past Medical History:  Past Medical History  Diagnosis Date  . Hypertension   . Diabetes mellitus   . Blindness   . TIA (transient ischemic attack)   . Anemia   . GERD (  gastroesophageal reflux disease)   . Renal disorder   . Constipation     Past Surgical History:  Past Surgical History  Procedure Date  . Gallbladder surgery   . Esophageal dilation   . Cholecystectomy     Family History: History reviewed. No pertinent family history. Social History:  reports that she has never smoked. Her smokeless tobacco use includes Snuff. She reports that she does not drink alcohol or use illicit drugs. Not married.  Has one daughter.  Lives in [facility].  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  Allergies: No Known Allergies  Home medications: Prior to Admission medications   Medication Sig Start Date End Date Taking? Authorizing Provider  acetaminophen (TYLENOL) 325 MG tablet Take 2 tablets (650 mg total) by mouth every 6 (six) hours as needed (or Fever >/= 101). 06/29/11 06/28/12 Yes Srikar Cherlynn Kaiser, MD  albuterol (PROVENTIL) (5 MG/ML) 0.5% nebulizer solution Take 0.5 mLs (2.5 mg total) by  nebulization every 4 (four) hours as needed for wheezing. 06/29/11 06/28/12 Yes Srikar Cherlynn Kaiser, MD  aspirin EC 325 MG tablet Take 325 mg by mouth daily.     Yes Historical Provider, MD  azithromycin (ZITHROMAX Z-PAK) 250 MG tablet Take 250-500 mg by mouth daily. 500 mg on first dose. 250 mg every day for 4 days, started on 02-13-12   Yes Historical Provider, MD  Brinzolamide-Brimonidine [facility]) 1-0.2 % SUSP Place 1 drop into the left eye 2 (two) times daily.   Yes Historical Provider, MD  carvedilol (COREG) 3.125 MG tablet Take 1 tablet (3.125 mg total) by mouth daily with breakfast. 06/29/11 06/28/12 Yes Srikar Cherlynn Kaiser, MD  cefTRIAXone (ROCEPHIN) 1 G injection Inject 1 g into the muscle daily. Pt's therapy is for 7 days. Started 02-13-12   Yes Historical Provider, MD  docusate sodium (COLACE) 100 MG capsule Take 100 mg by mouth 2 (two) times daily.   Yes Historical Provider, MD  feeding supplement (PRO-STAT SUGAR FREE 64) LIQD Take 30 mLs by mouth 3 (three) times daily with meals.   Yes Historical Provider, MD  furosemide (LASIX) 20 MG tablet Take 20 mg by mouth 2 (two) times daily.   Yes Historical Provider, MD  gabapentin (NEURONTIN) 100 MG capsule Take 100 mg by mouth at bedtime.   Yes Historical Provider, MD  HYDROcodone-acetaminophen (NORCO/VICODIN) 5-325 MG per tablet Take 1 tablet by mouth 3 (three) times daily as needed. pain   Yes Historical Provider, MD  insulin aspart (NOVOLOG) 100 UNIT/ML injection Inject 0-9 Units into the skin 3 (three) times daily with meals. CBG < 70: implement hypoglycemia protocol CBG 70 - 120: 0 units CBG 121 - 150: 1 unit CBG 151 - 200: 2 units CBG 201 - 250: 3 units CBG 251 - 300: 5 units CBG 301 - 350: 7 units CBG 351 - 400: 9 units CBG > 400 call MD. 06/29/11 06/28/12 Yes Srikar Cherlynn Kaiser, MD  insulin glargine (LANTUS) 100 UNIT/ML injection Inject 10 Units into the skin at bedtime.   Yes Historical Provider, MD  ipratropium (ATROVENT) 0.02 % nebulizer solution  Take 2.5 mLs (0.5 mg total) by nebulization every 4 (four) hours as needed. 06/29/11 06/28/12 Yes Srikar Cherlynn Kaiser, MD  ipratropium-albuterol (DUONEB) 0.5-2.5 (3) MG/3ML SOLN Take 3 mLs by nebulization every 6 (six) hours as needed. wheezing   Yes Historical Provider, MD  metoCLOPramide (REGLAN) 5 MG tablet Take 5 mg by mouth 3 (three) times daily.   Yes Historical Provider, MD  Multiple Vitamins-Minerals (  MULTIVITAMIN WITH MINERALS) tablet Take 1 tablet by mouth daily.   Yes Historical Provider, MD  naproxen sodium (ALEVE) 220 MG tablet Take 220 mg by mouth 2 (two) times daily with a meal.   Yes Historical Provider, MD  sodium chloride (OCEAN) 0.65 % SOLN nasal spray Place 1 spray into the nose 4 (four) times daily as needed for congestion. 06/29/11  Yes Cristal Ford, MD    Inpatient medications:    . albuterol  2.5 mg Nebulization Once  . albuterol  2.5 mg Nebulization TID  . antiseptic oral rinse  15 mL Mouth Rinse q12n4p  . aspirin EC  325 mg Oral Daily  . brimonidine  1 drop Left Eye BID  . brinzolamide  1 drop Left Eye BID  . carvedilol  3.125 mg Oral Q breakfast  . chlorhexidine  15 mL Mouth/Throat BID  . docusate sodium  100 mg Oral BID  . gabapentin  100 mg Oral QHS  . insulin aspart  0-15 Units Subcutaneous TID WC  . insulin aspart  0-5 Units Subcutaneous QHS  . insulin glargine  10 Units Subcutaneous QHS  . ipratropium  0.5 mg Nebulization TID  . metoCLOPramide  5 mg Oral TID WC  . multivitamin with minerals  1 tablet Oral Daily  . sodium chloride  3 mL Intravenous Q12H  . sodium chloride  3 mL Intravenous Q12H    Review of Systems Gen:  Denies headache, fever, chills, sweats HEENT:  Has been blind for some time (she says "since 2013" but then says "longer" Resp:  Short of breath with exertion; cough - improved since admit Cardiac:  No chest pain,  GI:   Denies abdominal pain.   No nausea, vomiting, diarrhea.  Chronic issues with constipation. GU:  No voiding difficulties  prior to hospital admission  MS:  Legs sometimes feel swollen foot hurts (amputation) Derm:  Denies skin rash or itching.  No chronic skin conditions.  Neuro:   Denies focal weakness, memory problems, hx stroke or TIA.   Psych:  Denies symptoms of depression of anxiety.  No hallucination.    Physical Exam:  Blood pressure 140/60, pulse 70, temperature 97.9 F (36.6 C), temperature source Oral, resp. rate 18, height 5\' 2"  (1.575 m), weight 97.8 kg (215 lb 9.8 oz), SpO2 93.00%.  02/19/12 0548 97.8 kg (215 lb 9.8 oz)  02/17/12 0604 92.2 kg (203 lb 4.2 oz) 02/16/12 1800 90.8 kg (200 lb 2.8 oz)  02/16/12 0300 90.1 kg (198 lb 10.2 oz) 02/15/12 0400 89.6 kg (197 lb 8.5 oz) 02/14/12 1500 88.6 kg (195 lb 5.2 oz)  Gen: Elderly BF  Wearing sunglasses Skin: Warm, dry.  No rash Neck: Cannot see neck veins Chest: Crackles left more so than right posteriorly Heart: regular, no rub or gallop Abdomen: soft, obese, not tender; no abd bruits Ext: amputation site wrapped right foot - dressings not removed !+ edema LE's Neuro: alert, Ox3, no focal deficit other than blind Heme/Lymph: no bruising or LAN   Labs: Basic Metabolic Panel:  Lab 02/19/12 1610 02/18/12 0849 02/16/12 0658 02/15/12 0345 02/14/12 1045  NA 133* 131* 131* 135 138  K 3.9 3.6 4.0 3.4* 4.6  CL 96 93* 94* 98 97  CO2 20 20 23 24 22   GLUCOSE 109* 116* 203* 160* 96  BUN 79* 79* 74* 72* 71*  CREATININE 2.68* 2.57* 1.73* 1.48* 1.48*  ALB -- -- -- -- --  CALCIUM 8.9 9.0 8.8 9.0 9.1  PHOS -- -- -- -- --  Liver Function Tests:  Lab 02/14/12 1045  AST 40*  ALT 24  ALKPHOS 174*  BILITOT 0.8  PROT 7.8  ALBUMIN 2.4*   No results found for this basename: LIPASE:3,AMYLASE:3 in the last 168 hours  Lab 02/14/12 1045  AMMONIA 32   CBC:  Lab 02/16/12 0629 02/15/12 0345 02/14/12 1045  WBC 19.9* 21.2* 26.9*  NEUTROABS -- -- 24.7*  HGB 10.4* 10.3* 10.4*  HCT 32.0* 31.1* 32.2*  MCV 83.8 83.8 86.1  PLT 343 374 385     CBG:  Lab 02/19/12 0756 02/18/12 2143 02/18/12 1749 02/18/12 1229 02/18/12 0735  GLUCAP 96 99 175* 94 111*  Results for DARRIS, CARACHURE (MRN [id number]) as of 02/19/2012 14:12  Ref. Range 02/19/2012 10:40  Color, Urine Latest Range: YELLOW  YELLOW  APPearance Latest Range: CLEAR  TURBID (A)  Specific Gravity, Urine Latest Range: 1.005-1.030  1.021  pH Latest Range: 5.0-8.0  5.0  Glucose Latest Range: NEGATIVE mg/dL NEGATIVE  Bilirubin Urine Latest Range: NEGATIVE  NEGATIVE  Ketones, ur Latest Range: NEGATIVE mg/dL NEGATIVE  Protein Latest Range: NEGATIVE mg/dL NEGATIVE  Urobilinogen, UA Latest Range: 0.0-1.0 mg/dL 0.2  Nitrite Latest Range: NEGATIVE  NEGATIVE  Leukocytes, UA Latest Range: NEGATIVE  LARGE (A)  Hgb urine dipstick Latest Range: NEGATIVE  LARGE (A)  Urine-Other No range found FEW YEAST  WBC, UA Latest Range: <3 WBC/hpf TOO NUMEROUS TO COUNT  RBC / HPF Latest Range: <3 RBC/hpf 11-20  Squamous Epithelial / LPF Latest Range: RARE  FEW (A)  Bacteria, UA Latest Range: RARE  FEW (A)  URINE CULTURE No range found Rpt    Iron Studies: No results found for this basename: IRON:30,TIBC:30,TRANSFERRIN:30,FERRITIN:30 in the last 168 hours  Xrays/Other Studies: Dg Chest 2 View  02/17/2012  *RADIOLOGY REPORT*  Clinical Data: Increased cough and shortness of breath.  Question fluid overload.  CHEST - 2 VIEW  Comparison: Chest radiograph 02/15/2012.  Findings: Stable cardiomegaly.  There is diffuse pulmonary vascular congestion with interstitial prominence in the perihilar regions of lung bases.  Patchy airspace opacities in the left mid lung.  Small bilateral pleural effusions.  Thickening of the fissures on the lateral view.  IMPRESSION: Mild congestive heart failure pattern with small bilateral pleural effusions.   Original Report Authenticated By: Britta Mccreedy, M.D.    US Renal  02/17/2012  *RADIOLOGY REPORT*  Clinical Data: Acute renal insufficiency with decreased urine output.   RENAL/URINARY TRACT ULTRASOUND COMPLETE  Comparison:  CT 06/26/2011  Findings:  Right Kidney:  12.9 cm. No hydronephrosis.  Small renal calculi identified. Normal renal cortical thickness and echogenicity.  Left Kidney:  11.9 cm. No hydronephrosis.  Small renal calculi identified. Normal renal cortical thickness and echogenicity.  Bladder:  Collapsed around a Foley catheter.  IMPRESSION: No hydronephrosis.  Renal collecting system calculi, as before.   Original Report Authenticated By: Jeronimo Greaves, M.D.    Mr Foot Right Wo Contrast  02/17/2012  *RADIOLOGY REPORT*  Clinical Data: Necrotic second toe.  MRI OF THE RIGHT FOREFOOT WITHOUT CONTRAST  Technique:  Multiplanar, multisequence MR imaging was performed. No intravenous contrast was administered.  Comparison: Radiographs 02/17/2012.  Findings: Abnormal signal intensity in the distal phalanx of the second toe suspicious for osteomyelitis.  No findings to suggest septic arthritis.  There is mild diffuse cellulitis and diffuse myofasciitis.  No focal soft tissue abscess.  IMPRESSION:  1.  Osteomyelitis involving the distal phalanx of the second toe. 2.  Cellulitis and myofasciitis without focal soft tissue abscess.  Original Report Authenticated By: Rudie Meyer, M.D.     Impression/Plan 72 yo female with a background of DM, HTN, CKD3 (suspect due to DM) with AKI on CKD in the setting of HCAP, toe gangrene/osteo requiring amputation, supratherapeutic vancomycin levels and CHF.  CKD3 with AKI on CKD  No obstruction by [place]  Proteinuria by dipstick intermittent since 2011 (although dip negative this admit??)  Supratherapeutic vanco  Possible hemodynamic issues of CHF  Probable ATN  Volume status difficult to assess but I believe volume excess; weight up 8 kg  Anemia may be d/t CKD but needs further w/u     Check SPEP, UPEP  Fe studies  Complements  ANA  IV lasix 80 Q12 H writing for 4 doses  Check F/U CXR  Agree with hold on vanco  Rate of rise  of creatinine a little slower past 24 hours so hope for plateau soon  Avoid PICC lines in the arms (patient says would want dialysis if progression to that point)  Avoid NSAIDS, ACE, ARB  Will follow.  Thanks for the consult.   Camille Bal,  MD [facility] [phone] pager 02/19/2012, 12:46 PM

## 2012-02-20 LAB — IRON AND TIBC
Saturation Ratios: 33 % (ref 20–55)
TIBC: 176 ug/dL — ABNORMAL LOW (ref 250–470)
UIBC: 118 ug/dL — ABNORMAL LOW (ref 125–400)

## 2012-02-20 LAB — RENAL FUNCTION PANEL
Albumin: 2 g/dL — ABNORMAL LOW (ref 3.5–5.2)
BUN: 76 mg/dL — ABNORMAL HIGH (ref 6–23)
Chloride: 95 mEq/L — ABNORMAL LOW (ref 96–112)
Creatinine, Ser: 2.82 mg/dL — ABNORMAL HIGH (ref 0.50–1.10)
Glucose, Bld: 160 mg/dL — ABNORMAL HIGH (ref 70–99)
Potassium: 3.7 mEq/L (ref 3.5–5.1)

## 2012-02-20 LAB — CBC WITH DIFFERENTIAL/PLATELET
Basophils Absolute: 0.1 10*3/uL (ref 0.0–0.1)
Eosinophils Relative: 4 % (ref 0–5)
Lymphocytes Relative: 11 % — ABNORMAL LOW (ref 12–46)
MCV: 84.6 fL (ref 78.0–100.0)
Neutrophils Relative %: 75 % (ref 43–77)
Platelets: 379 10*3/uL (ref 150–400)
RBC: 3.32 MIL/uL — ABNORMAL LOW (ref 3.87–5.11)
RDW: 15.9 % — ABNORMAL HIGH (ref 11.5–15.5)
WBC: 14 10*3/uL — ABNORMAL HIGH (ref 4.0–10.5)

## 2012-02-20 LAB — URINE CULTURE

## 2012-02-20 LAB — GLUCOSE, CAPILLARY
Glucose-Capillary: 167 mg/dL — ABNORMAL HIGH (ref 70–99)
Glucose-Capillary: 117 mg/dL — ABNORMAL HIGH (ref 70–99)

## 2012-02-20 LAB — CK: Total CK: 8 U/L (ref 7–177)

## 2012-02-20 LAB — VANCOMYCIN, RANDOM: Vancomycin Rm: 30.3 ug/mL

## 2012-02-20 LAB — FERRITIN: Ferritin: 103 ng/mL (ref 10–291)

## 2012-02-20 LAB — C3 COMPLEMENT: C3 Complement: 134 mg/dL (ref 90–180)

## 2012-02-20 MED ORDER — OXYCODONE HCL 5 MG PO TABS
5.0000 mg | ORAL_TABLET | ORAL | Status: DC | PRN
  Administered 2012-02-20 – 2012-02-26 (×8): 5 mg via ORAL
  Filled 2012-02-20 (×8): qty 1

## 2012-02-20 MED ORDER — ALBUTEROL SULFATE (5 MG/ML) 0.5% IN NEBU
2.5000 mg | INHALATION_SOLUTION | Freq: Two times a day (BID) | RESPIRATORY_TRACT | Status: DC
  Administered 2012-02-20 – 2012-02-26 (×11): 2.5 mg via RESPIRATORY_TRACT
  Filled 2012-02-20 (×12): qty 0.5

## 2012-02-20 MED ORDER — ALBUTEROL SULFATE (5 MG/ML) 0.5% IN NEBU: 2.5000 mg | INHALATION_SOLUTION | Freq: Four times a day (QID) | RESPIRATORY_TRACT | Status: DC | PRN

## 2012-02-20 MED ORDER — ALBUTEROL SULFATE (5 MG/ML) 0.5% IN NEBU
2.5000 mg | INHALATION_SOLUTION | Freq: Once | RESPIRATORY_TRACT | Status: AC
  Administered 2012-02-20: 2.5 mg via RESPIRATORY_TRACT
  Filled 2012-02-20: qty 0.5

## 2012-02-20 MED ORDER — IPRATROPIUM BROMIDE 0.02 % IN SOLN
0.5000 mg | Freq: Two times a day (BID) | RESPIRATORY_TRACT | Status: DC
  Administered 2012-02-20 – 2012-02-26 (×11): 0.5 mg via RESPIRATORY_TRACT
  Filled 2012-02-20 (×13): qty 2.5

## 2012-02-20 NOTE — Progress Notes (Signed)
Subjective: 2 Days Post-Op Procedure(s) (LRB): AMPUTATION RAY (Right) Patient reports pain as 8 on 0-10 scale.    Objective: Vital signs in last 24 hours: Temp:  [97.8 F (36.6 C)-97.9 F (36.6 C)] 97.9 F (36.6 C) (01/22 0449) Pulse Rate:  [65-82] 82  (01/22 0449) Resp:  [16-18] 16  (01/22 0449) BP: (128-153)/(53-67) 128/67 mmHg (01/22 0449) SpO2:  [93 %-100 %] 98 % (01/22 0757)  Intake/Output from previous day: 01/21 0701 - 01/22 0700 In: 880 [P.O.:880] Out: 750 [Urine:750] Intake/Output this shift:     Basename 02/20/12 0710  HGB 9.1*    Basename 02/20/12 0710  WBC 14.0*  RBC 3.32*  HCT 28.1*  PLT 379    Basename 02/20/12 0500 02/19/12 0830  NA 132* 133*  K 3.7 3.9  CL 95* 96  CO2 21 20  BUN 76* 79*  CREATININE 2.82* 2.68*  GLUCOSE 160* 109*  CALCIUM 8.9 8.9   No results found for this basename: LABPT:2,INR:2 in the last 72 hours  Dorsiflexion/Plantar flexion intact  Assessment/Plan: 2 Days Post-Op Procedure(s) (LRB): AMPUTATION RAY (Right) Advance diet Up with therapy  Pink Maye F 02/20/2012, 11:34 AM

## 2012-02-20 NOTE — Progress Notes (Signed)
[facility]  Date of Admission:  02/14/2012  Antibiotics: Vancomycin day 7 (currently held for supratherapeutic level)  Subjective: No acute events  Objective: Temp:  [97.9 F (36.6 C)] 97.9 F (36.6 C) (01/22 0449) Pulse Rate:  [68-82] 82  (01/22 0449) Resp:  [16-18] 16  (01/22 0449) BP: (128-153)/(66-67) 128/67 mmHg (01/22 0449) SpO2:  [93 %-100 %] 98 % (01/22 1423)  General: Awake, alert, nad Skin: no rashes   Lab Results Lab Results  Component Value Date   WBC 14.0* 02/20/2012   HGB 9.1* 02/20/2012   HCT 28.1* 02/20/2012   MCV 84.6 02/20/2012   PLT 379 02/20/2012    Lab Results  Component Value Date   CREATININE 2.82* 02/20/2012   BUN 76* 02/20/2012   NA 132* 02/20/2012   K 3.7 02/20/2012   CL 95* 02/20/2012   CO2 21 02/20/2012    Lab Results  Component Value Date   ALT 24 02/14/2012   AST 40* 02/14/2012   ALKPHOS 174* 02/14/2012   BILITOT 0.8 02/14/2012      Microbiology: Recent Results (from the past 240 hour(s))  CULTURE, BLOOD (ROUTINE X 2)     Status: Normal   Collection Time   02/14/12 10:35 AM      Component Value Range Status Comment   Specimen Description BLOOD RIGHT HAND   Final    Special Requests BOTTLES DRAWN AEROBIC ONLY   Final    Culture  Setup Time 02/14/2012 14:49   Final    Culture     Final    Value: METHICILLIN RESISTANT STAPHYLOCOCCUS AUREUS     Note: RIFAMPIN AND GENTAMICIN SHOULD NOT BE USED AS SINGLE DRUGS FOR TREATMENT OF STAPH INFECTIONS. CRITICAL RESULT CALLED TO, READ BACK BY AND VERIFIED WITH: BETHANY STRINGER 02/16/12 @ 8:22PM BY RUSCA.     Note: Gram Stain Report Called to,Read Back By and Verified With: JESSICA CONRAD 02/15/12 1440 BY SMITHERSJ   Report Status 02/17/2012 FINAL   Final    Organism ID, Bacteria METHICILLIN RESISTANT STAPHYLOCOCCUS AUREUS   Final   CULTURE, BLOOD (ROUTINE X 2)     Status: Normal   Collection Time   02/14/12 10:45 AM      Component Value Range Status Comment   Specimen  Description BLOOD RIGHT FOREARM   Final    Special Requests BOTTLES DRAWN AEROBIC ONLY   Final    Culture  Setup Time 02/14/2012 14:49   Final    Culture     Final    Value: METHICILLIN RESISTANT STAPHYLOCOCCUS AUREUS     Note: SUSCEPTIBILITIES PERFORMED ON PREVIOUS CULTURE WITHIN THE LAST 5 DAYS. CRITICAL RESULT CALLED TO, READ BACK BY AND VERIFIED WITH: BETHANY STRINGER 02/16/12 @ 8:22PM BY RUSCA.     Note: CRITICAL RESULT CALLED TO, READ BACK BY AND VERIFIED WITH: MILLIE PRICE 02/16/12 @ 10:55AM BY RUSCA.   Report Status 02/17/2012 FINAL   Final   URINE CULTURE     Status: Normal   Collection Time   02/14/12 10:46 AM      Component Value Range Status Comment   Specimen Description URINE, CATHETERIZED   Final    Special Requests NONE   Final    Culture  Setup Time 02/14/2012 18:58   Final    Colony Count NO GROWTH   Final    Culture NO GROWTH   Final    Report Status 02/15/2012 FINAL   Final   MRSA PCR SCREENING  Status: Normal   Collection Time   02/14/12  3:08 PM      Component Value Range Status Comment   MRSA by PCR NEGATIVE  NEGATIVE Final   CULTURE, BLOOD (ROUTINE X 2)     Status: Normal (Preliminary result)   Collection Time   02/16/12  4:30 PM      Component Value Range Status Comment   Specimen Description BLOOD RIGHT HAND  3 ML IN [facility] BOTTLE   Final    Special Requests NONE   Final    Culture  Setup Time 02/16/2012 23:17   Final    Culture     Final    Value:        BLOOD CULTURE RECEIVED NO GROWTH TO DATE CULTURE WILL BE HELD FOR 5 DAYS BEFORE ISSUING A FINAL NEGATIVE REPORT   Report Status PENDING   Incomplete   CULTURE, BLOOD (ROUTINE X 2)     Status: Normal (Preliminary result)   Collection Time   02/16/12  4:48 PM      Component Value Range Status Comment   Specimen Description BLOOD RIGHT ARM  2 ML IN [facility] BOTTLE   Final    Special Requests NONE   Final    Culture  Setup Time 02/16/2012 23:16   Final    Culture     Final    Value:        BLOOD CULTURE  RECEIVED NO GROWTH TO DATE CULTURE WILL BE HELD FOR 5 DAYS BEFORE ISSUING A FINAL NEGATIVE REPORT   Report Status PENDING   Incomplete   SURGICAL PCR SCREEN     Status: Normal   Collection Time   02/18/12  5:25 AM      Component Value Range Status Comment   MRSA, PCR NEGATIVE  NEGATIVE Final    Staphylococcus aureus NEGATIVE  NEGATIVE Final     Studies/Results: Dg Chest 2 View  02/19/2012  *RADIOLOGY REPORT*  Clinical Data: Weakness.  Evaluate for pulmonary edema  CHEST - 2 VIEW  Comparison: None 02/17/2012  Findings: This examination is technically limited by body habitus and patient positioning (semiupright) Cardiomegaly is stable. There is bilateral pulmonary vascular congestion.  There is a patchy left retrocardiac opacity.  Hazy opacity the right lung base may be due to overlying soft tissues of the breast. On the lateral view, the posterior costophrenic angles are obscured by the table, as the patient was in semiupright position.  IMPRESSION: Cardiomegaly and mild pulmonary vascular congestion. Image detail limited by patient body habitus and positioning. Small left basilar opacity could be due to atelectasis or airspace disease. Small bilateral pleural effusions cannot be excluded, as described above.   Original Report Authenticated By: Britta Mccreedy, M.D.    Dg Esophagus  02/19/2012  *RADIOLOGY REPORT*  Clinical Data: Dilated esophagus on modified barium swallow.  The patient reports no dysphagia or difficulty edema drinking.  ESOPHOGRAM/BARIUM SWALLOW  Technique:  Single contrast examination was performed using thin barium.  Fluoroscopy time:  2.3 minutes.  Comparison:  None.  Findings:  Exam is somewhat limited due to patient immobility. Patient had a toe amputation 1 day prior.  The esophagus is patulous.  There is significant esophageal dysmotility with tertiary contractions and to-and-fro motion of the barium bolus.  There is some mild irregularity at the GE junction with mild narrowing.  A  13 mm barium tablet did pass GE junction easily.  IMPRESSION:  1.  Significant esophageal dysmotility with tertiary contractions and to-and-fro motion  of the barium bolus.  Patulous esophagus.  2.  Some mild stricturing of the distal esophagus.  Cannot exclude a mucosal irregularity. 3.  Barium tablet passed through the GE junction easily.   Original Report Authenticated By: Genevive Bi, M.D.     Assessment/Plan: 1) MRSA bacteremia - repeat cultures remain negative to date.  TTE 1/16 without any significant vegetation.   -she will need a PICC but due to renal disease, picc is not recommended. ?other option for peripheral IV as outpatient -TEE scheduled for tomorrow  -  I have discussed with nephrology and we can change to Cubicin when vancomycin level is subtherapeutic to limit nephrotoxic agents  -if TEE negative for vegetation, her course of IV daptomycin may be shortened now that ray amputated  Staci Righter, MD [facility] [phone] pager   02/20/2012, 3:20 PM

## 2012-02-20 NOTE — Progress Notes (Signed)
[facility] PROGRESS NOTE  [name] ZOX:[id number] DOB: February 02, 1940 DOA: 02/14/2012 PCP: No primary provider on file.  Assessment/Plan: Sepsis -secondary to HCAP and MRSA Bacteremia healthcare associated pneumonia  Given significant hypoxia , tachycardia and leukocytosis patient was admitted to step down monitoring. Patient started on empiric antibiotic IV vancomycin and IV cefazolin. Blood cultures sent on admission growing MRSA on both bottles.  -patient remains afebrile. Leukocytosis slowly improving.  -Flu negative  -Continue vancomycin MRSA bacteremia  -TEE planned 02/21/12 -no clear source except necrotic right 2nd toe which was amputated on 1/20.  TTEshows no vegetations.  on vancomycin. ( currently held for supratherapeutic levels) .Appreciate ID recs. Has a necrotic right second toe. MRI of the foot showed osteomyelitis. Seen by orthopedic consult and had amputation of the right second toe done on 1/20 and tolerated procedure well. ID recommended for 6-8 weeks abx. PICC ordered.  Acute on Chronic kidney disease  -nonoliguric -FeNa calculated was less than 1. Not improved with IV hydration. Renal ultrasound unremarkable. Repeated proBNP was markedly elevated concerning for volume overload. Chest x-ray obtained showed concerning for a mild congestive heart failure. 2D echo on admission showed mild diastolic dysfn.  -Appreciate nephrology, continue furosemide IV as outlined -held vancomycin given elevated levels. Doubt this is related to vancomycin toxicity.  -Avoid NSAIDs nephrotoxic agents.  Acute CHF--diastolic -Ejection fraction 55-60% -Clinically remains hypervolemic -Continue furosemide as discussed Acute respiratory failure  In the setting of pneumonia. currently stable  Diabetes mellitus  Continue sliding scale insulin and Lantus.  Hypertension  Currently stable. Continue home medications-coreg Dysphagia/esophageal dysmotility  Seen by speech and swallow and  MBS done today which showed esophageal dysphasia. Patient informs having esophagial dilatation done in 2001 by Dr. Arlyce Dice.  -Appreciate GI recommendations On dys level 1 diet  Code Status:full Code  Family Communication: none at bedside . Patient's legal guardian [name] was called and updated plan on 1/21  Disposition Plan: back to SNF once stable . Ordered PT eval  Consultants:  [facility]  ID  Orthopedics  [facility] Procedures:  Modified barium swallow ( 1/18)  Right second toe amputation on 1/20  Standard barium swallow ordered        Procedures/Studies: Dg Chest 2 View  02/19/2012  *RADIOLOGY REPORT*  Clinical Data: Weakness.  Evaluate for pulmonary edema  CHEST - 2 VIEW  Comparison: None 02/17/2012  Findings: This examination is technically limited by body habitus and patient positioning (semiupright) Cardiomegaly is stable. There is bilateral pulmonary vascular congestion.  There is a patchy left retrocardiac opacity.  Hazy opacity the right lung base may be due to overlying soft tissues of the breast. On the lateral view, the posterior costophrenic angles are obscured by the table, as the patient was in semiupright position.  IMPRESSION: Cardiomegaly and mild pulmonary vascular congestion. Image detail limited by patient body habitus and positioning. Small left basilar opacity could be due to atelectasis or airspace disease. Small bilateral pleural effusions cannot be excluded, as described above.   Original Report Authenticated By: Britta Mccreedy, M.D.    Dg Chest 2 View  02/17/2012  *RADIOLOGY REPORT*  Clinical Data: Increased cough and shortness of breath.  Question fluid overload.  CHEST - 2 VIEW  Comparison: Chest radiograph 02/15/2012.  Findings: Stable cardiomegaly.  There is diffuse pulmonary vascular congestion with interstitial prominence in the perihilar regions of lung bases.  Patchy airspace opacities in the left mid lung.  Small bilateral pleural effusions.   Thickening of the fissures on the  lateral view.  IMPRESSION: Mild congestive heart failure pattern with small bilateral pleural effusions.   Original Report Authenticated By: Britta Mccreedy, M.D.    Ct Head Wo Contrast  02/14/2012  *RADIOLOGY REPORT*  Clinical Data: Altered mental status.  History of weakness and TIA.  CT HEAD WITHOUT CONTRAST  Technique:  Contiguous axial images were obtained from the base of the skull through the vertex without contrast.  Comparison: 12/25/2008  Findings: Bone windows demonstrate similar abnormal appearance of the right globe, presumably related to prior trauma or hemorrhage. There are also similar calcifications about the left globe.  Mucosal thickening of the sphenoid sinus and ethmoid air cells.  A left mastoid effusion is again identified.  Soft tissue windows demonstrate similar moderate hydrocephalus, including the fourth, third, and lateral ventricles.  Slight increase in periventricular white matter hypoattenuation.  Redemonstration of right frontal hypoattenuation which is felt to be similar, including on image 20/series 2.  No mass lesion, hemorrhage, intra-axial, or extra-axial fluid collection.  IMPRESSION:  1.  Similar hydrocephalus. 2.  Slight increase in periventricular white matter hypoattenuation.  Favor small vessel ischemic change.  A component of transependymal CSF resorption could look similar. 3.  Right frontal lobe remote infarct. 4.  Sinus disease with a left mastoid effusion.   Original Report Authenticated By: Jeronimo Greaves, M.D.    Dg Esophagus  02/19/2012  *RADIOLOGY REPORT*  Clinical Data: Dilated esophagus on modified barium swallow.  The patient reports no dysphagia or difficulty edema drinking.  ESOPHOGRAM/BARIUM SWALLOW  Technique:  Single contrast examination was performed using thin barium.  Fluoroscopy time:  2.3 minutes.  Comparison:  None.  Findings:  Exam is somewhat limited due to patient immobility. Patient had a toe amputation 1 day  prior.  The esophagus is patulous.  There is significant esophageal dysmotility with tertiary contractions and to-and-fro motion of the barium bolus.  There is some mild irregularity at the GE junction with mild narrowing.  A 13 mm barium tablet did pass GE junction easily.  IMPRESSION:  1.  Significant esophageal dysmotility with tertiary contractions and to-and-fro motion of the barium bolus.  Patulous esophagus.  2.  Some mild stricturing of the distal esophagus.  Cannot exclude a mucosal irregularity. 3.  Barium tablet passed through the GE junction easily.   Original Report Authenticated By: Genevive Bi, M.D.    US Renal  02/17/2012  *RADIOLOGY REPORT*  Clinical Data: Acute renal insufficiency with decreased urine output.  RENAL/URINARY TRACT ULTRASOUND COMPLETE  Comparison:  CT 06/26/2011  Findings:  Right Kidney:  12.9 cm. No hydronephrosis.  Small renal calculi identified. Normal renal cortical thickness and echogenicity.  Left Kidney:  11.9 cm. No hydronephrosis.  Small renal calculi identified. Normal renal cortical thickness and echogenicity.  Bladder:  Collapsed around a Foley catheter.  IMPRESSION: No hydronephrosis.  Renal collecting system calculi, as before.   Original Report Authenticated By: Jeronimo Greaves, M.D.    Mr Foot Right Wo Contrast  02/17/2012  *RADIOLOGY REPORT*  Clinical Data: Necrotic second toe.  MRI OF THE RIGHT FOREFOOT WITHOUT CONTRAST  Technique:  Multiplanar, multisequence MR imaging was performed. No intravenous contrast was administered.  Comparison: Radiographs 02/17/2012.  Findings: Abnormal signal intensity in the distal phalanx of the second toe suspicious for osteomyelitis.  No findings to suggest septic arthritis.  There is mild diffuse cellulitis and diffuse myofasciitis.  No focal soft tissue abscess.  IMPRESSION:  1.  Osteomyelitis involving the distal phalanx of the second toe. 2.  Cellulitis  and myofasciitis without focal soft tissue abscess.   Original Report  Authenticated By: Rudie Meyer, M.D.    Portable Chest 1 View  02/15/2012  *RADIOLOGY REPORT*  Clinical Data: CHF.  PORTABLE CHEST - 1 VIEW  Comparison: 02/14/2012  Findings: Shallow inspiration.  Cardiac enlargement with pulmonary vascular congestion and perihilar infiltration suggesting edema or pneumonia.  The infiltration is slightly more prominent than previous study.  No pneumothorax or effusion.  IMPRESSION: Cardiac enlargement with pulmonary vascular congestion.  Increasing perihilar infiltrates or edema bilaterally.   Original Report Authenticated By: Burman Nieves, M.D.    Dg Chest Portable 1 View  02/14/2012  *RADIOLOGY REPORT*  Clinical Data: Shortness of breath and diabetes.  Hypertension.  PORTABLE CHEST - 1 VIEW  Comparison: Two-view chest x-ray and CT of the chest 06/22/2011.  Findings: The heart is mildly enlarged.  Lung volumes are low. Mild interstitial and airspace disease is present bilaterally. The bone windows are unremarkable.  IMPRESSION:  1.  Mild cardiomegaly and edema is concerning for congestive heart failure. 2.  There is some airspace disease bilaterally which raises the possibility of infection as well.   Original Report Authenticated By: Marin Roberts, M.D.    Dg Foot 2 Views Right  02/17/2012  *RADIOLOGY REPORT*  Clinical Data: Necrotic second toe right foot.  History of diabetes.  Staph bacteremia.  RIGHT FOOT - 2 VIEW  Comparison: None.  Findings: Underlying renal osteodystrophy with vascular calcifications.  Only AP and lateral radiographs performed; no oblique imaging.  Overlap of the digits on the lateral view. Question soft tissue ulcer about the lateral aspect of the second toe distally on AP view.  No gross osseous destruction.  No callus deposition.  IMPRESSION: Limited 2 view study, demonstrating no gross osseous destruction. Possible soft tissue defect about the second toe laterally.  If ongoing clinical concern, consider contrast enhanced MRI.   Original  Report Authenticated By: Jeronimo Greaves, M.D.    Dg Swallowing Func-speech Pathology  02/16/2012  Chales Abrahams, CCC-SLP     02/16/2012 10:47 AM Objective Swallowing Evaluation: Modified Barium Swallowing Study   Patient Details  Name: [name] MRN: [id number] Date of Birth: 10-15-1940  Today's Date: 02/16/2012 Time: 0950-1015 SLP Time Calculation (min): 25 min  Past Medical History:  Past Medical History  Diagnosis Date  . Hypertension   . Diabetes mellitus   . Blindness   . TIA (transient ischemic attack)   . Anemia   . GERD (gastroesophageal reflux disease)   . Renal disorder   . Constipation    Past Surgical History:  Past Surgical History  Procedure Date  . Gallbladder surgery   . Esophageal dilation   . Cholecystectomy    HPI:  72 yo female adm to [facility] from [facility] SNF with  hypoxia, CXR + fluid, +/- pna- diagnosed with HCAP, ? CHF (await  BNP).  Per MD notes, pt told MD that she "always has pna."   PMH  + for TIA, GERD, anemia, constipation, HTN, blindness/HOH. SLP  phoned SNF SLP who reports she has not seen pt, she has however  seen the pt's roommate during meals and has not noted pt coughing  during meals.  Per SNF SLP, pt was recently "flagged for weight  loss" .  Esophageal dilatation completed in 03/1999 and pt states  she had problems swallowing before surgery that resolved after.    BSE completed Thursday pm and follow up Friday with  recommendation for MBS due to inability  to rule out  dysphagia/aspiration at bedside with pt overtly coughing.     Assessment / Plan / Recommendation Clinical Impression  Dysphagia Diagnosis: Suspected primary esophageal dysphagia;Mild  oral phase dysphagia;Mild pharyngeal phase dysphagia   Clinical impression: Pt presents with mild oropharyngeal and  suspected primary esophageal dysphagia.  Decreased oral control  results in premature spillage of liquids into pharynx and  piecemeal deglutition with solids/pudding as well as oral stasis  with poor  awareness.  Cued swallow facilitated timeliness of  swallow.  Pharyngeal dysphagia with delayed swallow initiation to  pyriform sinus with liquids, to vallecular space with  solid/pudding and  resultant trace silent penetration of liquids.   Cued throat clear removed trace penetrates but pt requiried  verbal cues x4 to complete and became agitated.  Pt prematurely  spills pudding/cracker filling entire vallecular space for longer  than 30 seconds as she continued to orally manipulate remainder  of bolus before she actually triggers a swallow.  No aspiration  or penetration with pudding/cracker noted.  Pt did not arrive to  test with her dentures unfortunately, therefore cracker was  softened.       Suspect esophageal deficits significantly contribute to  oropharyngeal deficits.  Appearance of prominent cricopharyngeus  with retention of secretions x1, CP did not impact barium flow.   However pt did appear with stasis from distal to mid esophagus  with distal narrowing.  Above area of narrowed region, esophagus  appeared dilated (? Motility issues).  Retrograde propulsion of  barium occurred WITHOUT pt awareness.   Suspect pt's primary  dysphagia is esophageal.    Pt was instructed to compensatory strategies, testing findings  and recommendations during the procedure. Pt may benefit from GI  referral for esophageal dysphagia management (h/o esophageal  dilatation in 2001).  In the interim time, rec liquid diet in  small amounts for maximal airway protection and medications be  crushed given with liquids if not contraindicated.  SLP to  follow.  Thanks.       Treatment Recommendation  Therapy as outlined in treatment plan below    Diet Recommendation Thin liquid (All Liquids)   Liquid Administration via: Cup;Straw Medication Administration: Other (Comment) (crush with liquids) Supervision: Full supervision/cueing for compensatory strategies Compensations: Small sips/bites;Slow rate;Clear throat  intermittently  Postural Changes and/or Swallow Maneuvers: Seated upright 90  degrees;Upright 30-60 min after meal    Other  Recommendations Recommended Consults: Consider GI  evaluation Oral Care Recommendations: Oral care BID   Follow Up Recommendations       Frequency and Duration min 2x/week  1 week   Pertinent Vitals/Pain     SLP Swallow Goals Patient will utilize recommended strategies during swallow to  increase swallowing safety with: Total assistance   General HPI: 72 yo female adm to [facility] from [facility]  SNF with hypoxia, CXR + fluid, +/- pna- diagnosed with HCAP, ?  CHF (await BNP).  Per MD notes, pt told MD that she "always has  pna."   PMH + for TIA, GERD, anemia, constipation, HTN,  blindness/HOH. SLP phoned SNF SLP who reports she has not seen  pt, she has however seen the pt's roommate during meals and has  not noted pt coughing during meals.  Per SNF SLP, pt was recently  "flagged for weight loss" .  Esophageal dilatation completed in  03/1999 and pt states she had problems swallowing before surgery  that resolved after.   BSE completed Thursday pm and follow up  Friday with recommendation for MBS due to inability to rule out  dysphagia/aspiration at bedside with pt overtly coughing. Type of Study: Modified Barium Swallowing Study Reason for Referral: Objectively evaluate swallowing function Diet Prior to this Study: Nectar-thick liquids;Regular Temperature Spikes Noted: No Respiratory Status: Supplemental O2 delivered via (comment) Behavior/Cognition: Alert;Cooperative;Requires cueing;Decreased  sustained attention;Agitated (easily agitated when asked  questions) Oral Cavity - Dentition: Dentures, not available (dentures left  in pt's room) Oral Motor / Sensory Function: Impaired - see Bedside swallow  eval Self-Feeding Abilities: Needs assist Patient Positioning: Upright in chair Baseline Vocal Quality: Clear Volitional Swallow: Unable to elicit (pt states she didn't have  anything to swallow)  Anatomy: Within functional limits Pharyngeal Secretions: Not observed secondary MBS    Reason for Referral Objectively evaluate swallowing function   Oral Phase Oral Preparation/Oral Phase Oral Phase: Impaired Oral - Nectar Oral - Nectar Teaspoon:  (decr oral control results in premature  spillage ) Oral - Nectar Cup:  (decr oral control results in premature  spillage) Oral - Thin Oral - Thin Teaspoon:  (decr oral control results in premature  spillage) Oral - Thin Cup:  (decr oral control results in premature  spillage) Oral - Thin Straw:  (decr oral control results in premature  spillage) Oral - Solids Oral - Puree: Weak lingual manipulation;Delayed oral  transit;Piecemeal swallowing;Lingual/palatal residue Oral - Regular: Piecemeal swallowing;Weak lingual  manipulation;Delayed oral transit;Impaired  mastication;Lingual/palatal residue Oral Phase - Comment Oral Phase - Comment: decr oral control results in premature  spillage of all liquids, pt did not have dentures present for  exam- cracker was softened with thin barium and pudding   Pharyngeal Phase Pharyngeal Phase Pharyngeal Phase: Impaired Pharyngeal - Nectar Pharyngeal - Nectar Teaspoon: Premature spillage to valleculae Pharyngeal - Nectar Cup: Delayed swallow initiation;Premature  spillage to pyriform sinuses Pharyngeal - Thin Pharyngeal - Thin Teaspoon: Premature spillage to pyriform  sinuses;Penetration/Aspiration during swallow Penetration/Aspiration details (thin teaspoon): Material enters  airway, remains ABOVE vocal cords then ejected out Pharyngeal - Thin Cup: Premature spillage to pyriform  sinuses;Penetration/Aspiration during swallow Penetration/Aspiration details (thin cup): Material enters  airway, CONTACTS cords and not ejected out (cued throat clear  removed trace amount of penetration) Pharyngeal - Thin Straw: Premature spillage to pyriform sinuses Pharyngeal - Solids Pharyngeal - Puree: Delayed swallow initiation;Premature spillage  to  pyriform sinuses (retention of barium at vallecular space  before swlalow) Pharyngeal - Regular: Premature spillage to valleculae;Delayed  swallow initiation (retention of cracker at vallecular space  before swallow)  Cervical Esophageal Phase    GO    Cervical Esophageal Phase Cervical Esophageal Phase: Impaired Cervical Esophageal Phase - Comment Cervical Esophageal Comment: appearance of prominent  cricopharyngeus with retention of secretions x1, did not impact  barium flow, appearance of stasis with distal narrowing and  appearance of dilated esophagus above narrowed region, retrograde  propulsion of barium appeared WITHOUT pt awareness          Donavan Burnet, MS [facility] SLP [phone]           Subjective: Patient complains of pain in the right foot post surgery. She denies any fevers, chills, chest pain, shortness breath, nausea, vomiting, diarrhea, abdominal pain. No dysuria or hematuria.  Objective: Filed Vitals:   02/19/12 2012 02/19/12 2100 02/20/12 0449 02/20/12 0757  BP: 153/66  128/67   Pulse: 68  82   Temp: 97.9 F (36.6 C)  97.9 F (36.6 C)   TempSrc: Oral  Oral   Resp: 18  16  Height:      Weight:      SpO2: 100% 93% 100% 98%    Intake/Output Summary (Last 24 hours) at 02/20/12 1143 Last data filed at 02/20/12 0500  Gross per 24 hour  Intake    640 ml  Output    750 ml  Net   -110 ml   Weight change:  Exam:   General:  Pt is alert, follows commands appropriately, not in acute distress  HEENT: No icterus, No thrush,[place]/AT  Cardiovascular: RRR, S1/S2, no rubs, no gallops  Respiratory: Bibasilar crackles. No wheezes or rhonchi. Good air movement.  Abdomen: Soft/+BS, non tender, non distended, no guarding  Extremities: No edema, No lymphangitis, No petechiae, No rashes, no synovitis  Data Reviewed: Basic Metabolic Panel:  Lab 02/20/12 1191 02/19/12 0830 02/18/12 0849 02/16/12 0658 02/15/12 0345  NA 132* 133* 131* 131* 135  K 3.7 3.9 3.6 4.0 3.4*  CL 95*  96 93* 94* 98  CO2 21 20 20 23 24   GLUCOSE 160* 109* 116* 203* 160*  BUN 76* 79* 79* 74* 72*  CREATININE 2.82* 2.68* 2.57* 1.73* 1.48*  CALCIUM 8.9 8.9 9.0 8.8 9.0  MG -- -- -- -- --  PHOS 5.0* -- -- -- --   Liver Function Tests:  Lab 02/20/12 0500 02/14/12 1045  AST -- 40*  ALT -- 24  ALKPHOS -- 174*  BILITOT -- 0.8  PROT -- 7.8  ALBUMIN 2.0* 2.4*   No results found for this basename: LIPASE:5,AMYLASE:5 in the last 168 hours  Lab 02/14/12 1045  AMMONIA 32   CBC:  Lab 02/20/12 0710 02/16/12 0629 02/15/12 0345 02/14/12 1045  WBC 14.0* 19.9* 21.2* 26.9*  NEUTROABS 10.5* -- -- 24.7*  HGB 9.1* 10.4* 10.3* 10.4*  HCT 28.1* 32.0* 31.1* 32.2*  MCV 84.6 83.8 83.8 86.1  PLT 379 343 374 385   Cardiac Enzymes:  Lab 02/20/12 0710 02/14/12 1045  CKTOTAL 8 --  CKMB -- --  CKMBINDEX -- --  TROPONINI -- <0.30   BNP: No components found with this basename: POCBNP:5 CBG:  Lab 02/20/12 0758 02/19/12 2117 02/19/12 1729 02/19/12 1232 02/19/12 0756  GLUCAP 167* 128* 107* 105* 96    Recent Results (from the past 240 hour(s))  CULTURE, BLOOD (ROUTINE X 2)     Status: Normal   Collection Time   02/14/12 10:35 AM      Component Value Range Status Comment   Specimen Description BLOOD RIGHT HAND   Final    Special Requests BOTTLES DRAWN AEROBIC ONLY   Final    Culture  Setup Time 02/14/2012 14:49   Final    Culture     Final    Value: METHICILLIN RESISTANT STAPHYLOCOCCUS AUREUS     Note: RIFAMPIN AND GENTAMICIN SHOULD NOT BE USED AS SINGLE DRUGS FOR TREATMENT OF STAPH INFECTIONS. CRITICAL RESULT CALLED TO, READ BACK BY AND VERIFIED WITH: BETHANY STRINGER 02/16/12 @ 8:22PM BY RUSCA.     Note: Gram Stain Report Called to,Read Back By and Verified With: JESSICA CONRAD 02/15/12 1440 BY SMITHERSJ   Report Status 02/17/2012 FINAL   Final    Organism ID, Bacteria METHICILLIN RESISTANT STAPHYLOCOCCUS AUREUS   Final   CULTURE, BLOOD (ROUTINE X 2)     Status: Normal   Collection Time    02/14/12 10:45 AM      Component Value Range Status Comment   Specimen Description BLOOD RIGHT FOREARM   Final    Special Requests BOTTLES DRAWN AEROBIC ONLY    Final    Culture  Setup Time 02/14/2012 14:49   Final    Culture     Final    Value: METHICILLIN RESISTANT STAPHYLOCOCCUS AUREUS     Note: SUSCEPTIBILITIES PERFORMED ON PREVIOUS CULTURE WITHIN THE LAST 5 DAYS. CRITICAL RESULT CALLED TO, READ BACK BY AND VERIFIED WITH: BETHANY STRINGER 02/16/12 @ 8:22PM BY RUSCA.     Note: CRITICAL RESULT CALLED TO, READ BACK BY AND VERIFIED WITH: MILLIE PRICE 02/16/12 @ 10:55AM BY RUSCA.   Report Status 02/17/2012 FINAL   Final   URINE CULTURE     Status: Normal   Collection Time   02/14/12 10:46 AM      Component Value Range Status Comment   Specimen Description URINE, CATHETERIZED   Final    Special Requests NONE   Final    Culture  Setup Time 02/14/2012 18:58   Final    Colony Count NO GROWTH   Final    Culture NO GROWTH   Final    Report Status 02/15/2012 FINAL   Final   MRSA PCR SCREENING     Status: Normal   Collection Time   02/14/12  3:08 PM      Component Value Range Status Comment   MRSA by PCR NEGATIVE  NEGATIVE Final   CULTURE, BLOOD (ROUTINE X 2)     Status: Normal (Preliminary result)   Collection Time   02/16/12  4:30 PM      Component Value Range Status Comment   Specimen Description BLOOD RIGHT HAND  3 ML IN [facility] BOTTLE   Final    Special Requests NONE   Final    Culture  Setup Time 02/16/2012 23:17   Final    Culture     Final    Value:        BLOOD CULTURE RECEIVED NO GROWTH TO DATE CULTURE WILL BE HELD FOR 5 DAYS BEFORE ISSUING A FINAL NEGATIVE REPORT   Report Status PENDING   Incomplete   CULTURE, BLOOD (ROUTINE X 2)     Status: Normal (Preliminary result)   Collection Time   02/16/12  4:48 PM      Component Value Range Status Comment   Specimen Description BLOOD RIGHT ARM  2 ML IN [facility] BOTTLE   Final    Special Requests NONE   Final    Culture  Setup Time 02/16/2012  23:16   Final    Culture     Final    Value:        BLOOD CULTURE RECEIVED NO GROWTH TO DATE CULTURE WILL BE HELD FOR 5 DAYS BEFORE ISSUING A FINAL NEGATIVE REPORT   Report Status PENDING   Incomplete   SURGICAL PCR SCREEN     Status: Normal   Collection Time   02/18/12  5:25 AM      Component Value Range Status Comment   MRSA, PCR NEGATIVE  NEGATIVE Final    Staphylococcus aureus NEGATIVE  NEGATIVE Final      Scheduled Meds:   . albuterol  2.5 mg Nebulization BID  . antiseptic oral rinse  15 mL Mouth Rinse q12n4p  . aspirin EC  325 mg Oral Daily  . brimonidine  1 drop Left Eye BID  . brinzolamide  1 drop Left Eye BID  . carvedilol  3.125 mg Oral Q breakfast  . chlorhexidine  15 mL Mouth/Throat BID  . docusate sodium  100 mg Oral BID  . furosemide  80 mg Intravenous  Q12H  . gabapentin  100 mg Oral QHS  . insulin aspart  0-15 Units Subcutaneous TID WC  . insulin aspart  0-5 Units Subcutaneous QHS  . insulin glargine  10 Units Subcutaneous QHS  . ipratropium  0.5 mg Nebulization BID  . metoCLOPramide  5 mg Oral TID WC  . multivitamin with minerals  1 tablet Oral Daily  . sodium chloride  3 mL Intravenous Q12H  . sodium chloride  3 mL Intravenous Q12H   Continuous Infusions:    Andrea Mealy, DO  [facility] Pager [phone]  If 7PM-7AM, please contact night-coverage [email or web address] Password [facility] 02/20/2012, 11:43 AM   LOS: 6 days

## 2012-02-20 NOTE — Progress Notes (Signed)
Subjective:  Patient denies any chest pain or shortness of breath complains of right foot pain. TEE rescheduled for tomorrow   Objective:  Vital Signs in the last 24 hours: Temp:  [97.9 F (36.6 C)] 97.9 F (36.6 C) (01/22 0449) Pulse Rate:  [68-82] 82  (01/22 0449) Resp:  [16-18] 16  (01/22 0449) BP: (128-153)/(66-67) 128/67 mmHg (01/22 0449) SpO2:  [93 %-100 %] 98 % (01/22 1423)  Intake/Output from previous day: 01/21 0701 - 01/22 0700 In: 880 [P.O.:880] Out: 750 [Urine:750] Intake/Output from this shift:    Physical Exam: Essentially unchanged  Lab Results:  Basename 02/20/12 0710  WBC 14.0*  HGB 9.1*  PLT 379    Basename 02/20/12 0500 02/19/12 0830  NA 132* 133*  K 3.7 3.9  CL 95* 96  CO2 21 20  GLUCOSE 160* 109*  BUN 76* 79*  CREATININE 2.82* 2.68*   No results found for this basename: TROPONINI:2,CK,MB:2 in the last 72 hours Hepatic Function Panel  Basename 02/20/12 0500  PROT --  ALBUMIN 2.0*  AST --  ALT --  ALKPHOS --  BILITOT --  BILIDIR --  IBILI --   No results found for this basename: CHOL in the last 72 hours No results found for this basename: PROTIME in the last 72 hours  Imaging: Imaging results have been reviewed and Dg Chest 2 View  02/19/2012  *RADIOLOGY REPORT*  Clinical Data: Weakness.  Evaluate for pulmonary edema  CHEST - 2 VIEW  Comparison: None 02/17/2012  Findings: This examination is technically limited by body habitus and patient positioning (semiupright) Cardiomegaly is stable. There is bilateral pulmonary vascular congestion.  There is a patchy left retrocardiac opacity.  Hazy opacity the right lung base may be due to overlying soft tissues of the breast. On the lateral view, the posterior costophrenic angles are obscured by the table, as the patient was in semiupright position.  IMPRESSION: Cardiomegaly and mild pulmonary vascular congestion. Image detail limited by patient body habitus and positioning. Small left basilar  opacity could be due to atelectasis or airspace disease. Small bilateral pleural effusions cannot be excluded, as described above.   Original Report Authenticated By: Britta Mccreedy, M.D.    Dg Esophagus  02/19/2012  *RADIOLOGY REPORT*  Clinical Data: Dilated esophagus on modified barium swallow.  The patient reports no dysphagia or difficulty edema drinking.  ESOPHOGRAM/BARIUM SWALLOW  Technique:  Single contrast examination was performed using thin barium.  Fluoroscopy time:  2.3 minutes.  Comparison:  None.  Findings:  Exam is somewhat limited due to patient immobility. Patient had a toe amputation 1 day prior.  The esophagus is patulous.  There is significant esophageal dysmotility with tertiary contractions and to-and-fro motion of the barium bolus.  There is some mild irregularity at the GE junction with mild narrowing.  A 13 mm barium tablet did pass GE junction easily.  IMPRESSION:  1.  Significant esophageal dysmotility with tertiary contractions and to-and-fro motion of the barium bolus.  Patulous esophagus.  2.  Some mild stricturing of the distal esophagus.  Cannot exclude a mucosal irregularity. 3.  Barium tablet passed through the GE junction easily.   Original Report Authenticated By: Genevive Bi, M.D.     Cardiac Studies:  Assessment/Plan:  MRSA bacteremia probably secondary to necrotic right foot status post amputation  Hypertension  Diabetes mellitus  Diabetic retinopathy with blindness  Degenerative joint disease  Anemia of chronic disease  Acute on chronic kidney injury Plan Continue present management Dr. Algie Coffer to  perform TEE in a.m.  LOS: 6 days    Alizon Schmeling N 02/20/2012, 4:04 PM

## 2012-02-20 NOTE — Progress Notes (Addendum)
Subjective:  Offers no complaints of SOB C/o pain in foot  Objective:     Vital signs in last 24 hours: Filed Vitals:   02/19/12 2100 02/20/12 0449 02/20/12 0757 02/20/12 1423  BP:  128/67    Pulse:  82    Temp:  97.9 F (36.6 C)    TempSrc:  Oral    Resp:  16    Height:      Weight:      SpO2: 93% 100% 98% 98%   Weight change:   Intake/Output Summary (Last 24 hours) at 02/20/12 1707 Last data filed at 02/20/12 1600  Gross per 24 hour  Intake   1120 ml  Output   1600 ml  Net   -480 ml    Physical Exam:  Blood pressure 128/67, pulse 82, temperature 97.9 F (36.6 C), temperature source Oral, resp. rate 16, height 5\' 2"  (1.575 m), weight 97.8 kg (215 lb 9.8 oz), SpO2 98.00%. No weight today 02/19/12 0548 97.8 kg (215 lb 9.8 oz)  02/17/12 0604 92.2 kg (203 lb 4.2 oz)  02/16/12 1800 90.8 kg (200 lb 2.8 oz)  02/16/12 0300 90.1 kg (198 lb 10.2 oz)  02/15/12 0400 89.6 kg (197 lb 8.5 oz) 02/14/12 1500 88.6 kg (195 lb 5.2 oz)   Elderly BF NAD Not very talkative today Puffy facies Line in neck (EJ) Lungs with crackles left more than right but ant fairly clear Abdomen soft NT 1+edema right leg Trace to 1+ left leg  Labs:   Lab 02/20/12 0500 02/19/12 0830 02/18/12 0849 02/16/12 0658 02/15/12 0345 02/14/12 1045  NA 132* 133* 131* 131* 135 138  K 3.7 3.9 3.6 4.0 3.4* 4.6  CL 95* 96 93* 94* 98 97  CO2 21 20 20 23 24 22   GLUCOSE 160* 109* 116* 203* 160* 96  BUN 76* 79* 79* 74* 72* 71*  CREATININE 2.82* 2.68* 2.57* 1.73* 1.48* 1.48*  ALB -- -- -- -- -- --  CALCIUM 8.9 8.9 9.0 8.8 9.0 9.1  PHOS 5.0* -- -- -- -- --     Lab 02/20/12 0500 02/14/12 1045  AST -- 40*  ALT -- 24  ALKPHOS -- 174*  BILITOT -- 0.8  PROT -- 7.8  ALBUMIN 2.0* 2.4*   No results found for this basename: LIPASE:3,AMYLASE:3 in the last 168 hours  Lab 02/14/12 1045  AMMONIA 32     Lab 02/20/12 0710 02/16/12 0629 02/15/12 0345 02/14/12 1045  WBC 14.0* 19.9* 21.2* 26.9*  NEUTROABS 10.5*  -- -- 24.7*  HGB 9.1* 10.4* 10.3* 10.4*  HCT 28.1* 32.0* 31.1* 32.2*  MCV 84.6 83.8 83.8 86.1  PLT 379 343 374 385     Lab 02/20/12 0710 02/14/12 1045  CKTOTAL 8 --  CKMB -- --  CKMBINDEX -- --  TROPONINI -- <0.30     Lab 02/20/12 1153 02/20/12 0758 02/19/12 2117 02/19/12 1729 02/19/12 1232  GLUCAP 123* 167* 128* 107* 105*      Lab 02/20/12 0710  IRON 58  TIBC 176*  TRANSFERRIN --  FERRITIN 103   Results for [name], [name] (MRN [id number]) as of 02/20/2012 19:37  Ref. Range 02/19/2012 08:30 02/20/2012 07:10  Vancomycin Rm No range found 32.8 30.3   Studies/Results: Dg Chest 2 View  02/19/2012  *RADIOLOGY REPORT*  Clinical Data: Weakness.  Evaluate for pulmonary edema  CHEST - 2 VIEW  Comparison: None 02/17/2012  Findings: This examination is technically limited by body habitus and patient positioning (semiupright) Cardiomegaly is stable. There  is bilateral pulmonary vascular congestion.  There is a patchy left retrocardiac opacity.  Hazy opacity the right lung base may be due to overlying soft tissues of the breast. On the lateral view, the posterior costophrenic angles are obscured by the table, as the patient was in semiupright position.  IMPRESSION: Cardiomegaly and mild pulmonary vascular congestion. Image detail limited by patient body habitus and positioning. Small left basilar opacity could be due to atelectasis or airspace disease. Small bilateral pleural effusions cannot be excluded, as described above.   Original Report Authenticated By: Britta Mccreedy, M.D.    Dg Esophagus  02/19/2012  *RADIOLOGY REPORT*  Clinical Data: Dilated esophagus on modified barium swallow.  The patient reports no dysphagia or difficulty edema drinking.  ESOPHOGRAM/BARIUM SWALLOW  Technique:  Single contrast examination was performed using thin barium.  Fluoroscopy time:  2.3 minutes.  Comparison:  None.  Findings:  Exam is somewhat limited due to patient immobility. Patient had a toe amputation 1 day  prior.  The esophagus is patulous.  There is significant esophageal dysmotility with tertiary contractions and to-and-fro motion of the barium bolus.  There is some mild irregularity at the GE junction with mild narrowing.  A 13 mm barium tablet did pass GE junction easily.  IMPRESSION:  1.  Significant esophageal dysmotility with tertiary contractions and to-and-fro motion of the barium bolus.  Patulous esophagus.  2.  Some mild stricturing of the distal esophagus.  Cannot exclude a mucosal irregularity. 3.  Barium tablet passed through the GE junction easily.   Original Report Authenticated By: Genevive Bi, M.D.          . albuterol  2.5 mg Nebulization BID  . antiseptic oral rinse  15 mL Mouth Rinse q12n4p  . aspirin EC  325 mg Oral Daily  . brimonidine  1 drop Left Eye BID  . brinzolamide  1 drop Left Eye BID  . carvedilol  3.125 mg Oral Q breakfast  . chlorhexidine  15 mL Mouth/Throat BID  . docusate sodium  100 mg Oral BID  . furosemide  80 mg Intravenous Q12H  . gabapentin  100 mg Oral QHS  . insulin aspart  0-15 Units Subcutaneous TID WC  . insulin aspart  0-5 Units Subcutaneous QHS  . insulin glargine  10 Units Subcutaneous QHS  . ipratropium  0.5 mg Nebulization BID  . metoCLOPramide  5 mg Oral TID WC  . multivitamin with minerals  1 tablet Oral Daily  . sodium chloride  3 mL Intravenous Q12H  . sodium chloride  3 mL Intravenous Q12H     I  have reviewed scheduled and prn medications.  ASSESSMENT/RECOMMENDATIONS  73 yo female with a background of DM, HTN, CKD3 (suspect due to DM) with AKI on CKD in the setting of HCAP, toe gangrene/osteo requiring ray amputation, MRSA bacteremia, possible HCAP, supratherapeutic vancomycin levels and CHF.  CKD with AKI on CKD (baseline 1.3-1.5) Probable ATN Supratherapeutic vanco may have contributed along with hemodynamic issues of CHF Vanco still high UOP picking up with lasix Still volume overloaded Creatinine has not yet reached  a plateau  SPEP and UPEP pending ANA pending  Recommend continue IV lasix for volume excess Continue to follow creatinine ID will change to cubicin for MRSA and if TEE negative can shorten course   Camille Bal, MD [facility] [phone] Pager 02/20/2012, 5:07 PM

## 2012-02-20 NOTE — Progress Notes (Signed)
ANTIBIOTIC CONSULT NOTE - FOLLOW UP  Pharmacy Consult for Vanc Indication: MRSA bacteremia/necrotic right toe amputation 1/20  No Known Allergies  Patient Measurements: Height: 5\' 2"  (157.5 cm) Weight: 215 lb 9.8 oz (97.8 kg) IBW/kg (Calculated) : 50.1    Labs:  Basename 02/20/12 0710 02/20/12 0500 02/19/12 1509 02/19/12 0830 02/18/12 0849  WBC 14.0* -- -- -- --  HGB 9.1* -- -- -- --  PLT 379 -- -- -- --  LABCREA -- -- 68.87 -- --  CREATININE -- 2.82* -- 2.68* 2.57*   Estimated Creatinine Clearance: 20.6 ml/min (by C-G formula based on Cr of 2.82).  Basename 02/20/12 0710 02/19/12 0830 02/18/12 0848  VANCOTROUGH -- -- 35.2*  VANCOPEAK -- -- --  VANCORANDOM 30.3 32.8 --  GENTTROUGH -- -- --  GENTPEAK -- -- --  GENTRANDOM -- -- --  TOBRATROUGH -- -- --  TOBRAPEAK -- -- --  TOBRARND -- -- --  AMIKACINPEAK -- -- --  AMIKACINTROU -- -- --  AMIKACIN -- -- --     Assessment:  81 yof resident of [facility] presented 1/16 with c/o SOB. Flu test obtained at SNF and CXR negative for PNA per MD note.  Patient reported h/o recurrent PNA. Azith PO and Rocephin IM @ SNF started 1/15.  CXR here cannot r/o infectious process.   Today is day#7 vanc for MRSA bacteremia secondary to necrotic right toe for amputation today.   Random vanc level still 30 today - note that last dose of vanc 1250mg  was on 1/19 at 10am; however, a dose of 500mg  IV vanc was given by ortho prior to amputation at 11am 1/20   SCr continues to rise   Goal of Therapy:  Vancomycin trough level 15-20 mcg/ml  Plan:  1) Continue to hold vancomycin 2) Recheck random vanc level in a few days since patient is essentially not clearing the vanc at all 3) Noted plan per ID to change to Cubicin once vanc level subtherapeutic to limit nephrotoxic drugs   Hessie Knows, PharmD, BCPS Pager [phone] 02/20/2012 9:40 AM

## 2012-02-20 NOTE — Progress Notes (Signed)
SLP Cancellation Note  Patient Details Name: [name] MRN: [id number] DOB: Oct 02, 1940   Cancelled treatment:       Reason Eval/Treat Not Completed:  (pt npo for TEE)     Dg Chest 2 View  02/19/2012  *RADIOLOGY REPORT*  Clinical Data: Weakness.  Evaluate for pulmonary edema  CHEST - 2 VIEW  Comparison: None 02/17/2012  Findings: This examination is technically limited by body habitus and patient positioning (semiupright) Cardiomegaly is stable. There is bilateral pulmonary vascular congestion.  There is a patchy left retrocardiac opacity.  Hazy opacity the right lung base may be due to overlying soft tissues of the breast. On the lateral view, the posterior costophrenic angles are obscured by the table, as the patient was in semiupright position.  IMPRESSION: Cardiomegaly and mild pulmonary vascular congestion. Image detail limited by patient body habitus and positioning. Small left basilar opacity could be due to atelectasis or airspace disease. Small bilateral pleural effusions cannot be excluded, as described above.   Original Report Authenticated By: Britta Mccreedy, M.D.    Dg Esophagus  02/19/2012  *RADIOLOGY REPORT*  Clinical Data: Dilated esophagus on modified barium swallow.  The patient reports no dysphagia or difficulty edema drinking.  ESOPHOGRAM/BARIUM SWALLOW  Technique:  Single contrast examination was performed using thin barium.  Fluoroscopy time:  2.3 minutes.  Comparison:  None.  Findings:  Exam is somewhat limited due to patient immobility. Patient had a toe amputation 1 day prior.  The esophagus is patulous.  There is significant esophageal dysmotility with tertiary contractions and to-and-fro motion of the barium bolus.  There is some mild irregularity at the GE junction with mild narrowing.  A 13 mm barium tablet did pass GE junction easily.  IMPRESSION:  1.  Significant esophageal dysmotility with tertiary contractions and to-and-fro motion of the barium bolus.  Patulous  esophagus.  2.  Some mild stricturing of the distal esophagus.  Cannot exclude a mucosal irregularity. 3.  Barium tablet passed through the GE junction easily.   Original Report Authenticated By: Genevive Bi, M.D.     Donavan Burnet, MS [facility] SLP [phone]

## 2012-02-21 ENCOUNTER — Encounter (HOSPITAL_COMMUNITY): Payer: Self-pay

## 2012-02-21 ENCOUNTER — Encounter (HOSPITAL_COMMUNITY)
Admission: EM | Disposition: A | Discharge status: Skilled Nursing Facility | Payer: Self-pay | Source: Home / Self Care | Attending: Internal Medicine

## 2012-02-21 DIAGNOSIS — A419 Sepsis, unspecified organism: Secondary | ICD-10-CM

## 2012-02-21 DIAGNOSIS — R7881 Bacteremia: Secondary | ICD-10-CM

## 2012-02-21 DIAGNOSIS — A4901 Methicillin susceptible Staphylococcus aureus infection, unspecified site: Secondary | ICD-10-CM

## 2012-02-21 HISTORY — PX: TEE WITHOUT CARDIOVERSION: SHX5443

## 2012-02-21 LAB — UIFE/LIGHT CHAINS/TP QN, 24-HR UR
Albumin, U: DETECTED
Alpha 1, Urine: DETECTED — AB
Free Kappa Lt Chains,Ur: 27.7 mg/dL — ABNORMAL HIGH (ref 0.14–2.42)
Free Kappa/Lambda Ratio: 9.58 ratio (ref 2.04–10.37)
Free Lambda Lt Chains,Ur: 2.89 mg/dL — ABNORMAL HIGH (ref 0.02–0.67)
Gamma Globulin, Urine: DETECTED — AB
Total Protein, Urine: 40 mg/dL

## 2012-02-21 LAB — RENAL FUNCTION PANEL
Albumin: 2 g/dL — ABNORMAL LOW (ref 3.5–5.2)
BUN: 71 mg/dL — ABNORMAL HIGH (ref 6–23)
Phosphorus: 4.1 mg/dL (ref 2.3–4.6)
Potassium: 3.7 mEq/L (ref 3.5–5.1)

## 2012-02-21 LAB — KAPPA/LAMBDA LIGHT CHAINS
Kappa, lambda light chain ratio: 1.15 (ref 0.26–1.65)
Lambda free light chains: 8.55 mg/dL — ABNORMAL HIGH (ref 0.57–2.63)

## 2012-02-21 LAB — GLUCOSE, CAPILLARY
Glucose-Capillary: 185 mg/dL — ABNORMAL HIGH (ref 70–99)
Glucose-Capillary: 185 mg/dL — ABNORMAL HIGH (ref 70–99)

## 2012-02-21 SURGERY — ECHOCARDIOGRAM, TRANSESOPHAGEAL
Anesthesia: Moderate Sedation

## 2012-02-21 MED ORDER — FUROSEMIDE 10 MG/ML IJ SOLN
80.0000 mg | Freq: Two times a day (BID) | INTRAMUSCULAR | Status: DC
  Administered 2012-02-21 – 2012-02-22 (×2): 80 mg via INTRAVENOUS
  Filled 2012-02-21 (×4): qty 8

## 2012-02-21 MED ORDER — MIDAZOLAM HCL 10 MG/2ML IJ SOLN
INTRAMUSCULAR | Status: DC | PRN
  Administered 2012-02-21 (×3): 1 mg via INTRAVENOUS

## 2012-02-21 MED ORDER — FLUMAZENIL 0.5 MG/5ML IV SOLN
INTRAVENOUS | Status: DC | PRN
  Administered 2012-02-21: .5 mg via INTRAVENOUS

## 2012-02-21 MED ORDER — FENTANYL CITRATE 0.05 MG/ML IJ SOLN
INTRAMUSCULAR | Status: DC | PRN
  Administered 2012-02-21 (×2): 25 ug via INTRAVENOUS

## 2012-02-21 MED ORDER — BUTAMBEN-TETRACAINE-BENZOCAINE 2-2-14 % EX AERO
INHALATION_SPRAY | CUTANEOUS | Status: DC | PRN
  Administered 2012-02-21: 2 via TOPICAL

## 2012-02-21 NOTE — Progress Notes (Signed)
Subjective:   No complaints Diuresis has picked up considerably Negative over 3 liters/24 hours   Objective:     Vital signs in last 24 hours: Filed Vitals:   02/21/12 1110 02/21/12 1140 02/21/12 1144 02/21/12 1401  BP: 150/68   140/60  Pulse: 69   68  Temp: 98.9 F (37.2 C)   97.6 F (36.4 C)  TempSrc: Oral   Oral  Resp: 18   18  Height:      Weight:      SpO2: 94% 88% 97% 100%   Weight change:   Intake/Output Summary (Last 24 hours) at 02/21/12 1859 Last data filed at 02/21/12 1814  Gross per 24 hour  Intake    480 ml  Output   4250 ml  Net  -3770 ml    Physical Exam:  Blood pressure 140/60, pulse 68, temperature 97.6 F (36.4 C), temperature source Oral, resp. rate 18, height 5\' 2"  (1.575 m), weight 97.8 kg (215 lb 9.8 oz), SpO2 100.00%.  No weight for 2 days 02/19/12 0548 97.8 kg (215 lb 9.8 oz)  02/17/12 0604 92.2 kg (203 lb 4.2 oz)  02/16/12 1800 90.8 kg (200 lb 2.8 oz)  02/16/12 0300 90.1 kg (198 lb 10.2 oz)  02/15/12 0400 89.6 kg (197 lb 8.5 oz) 02/14/12 1500 88.6 kg (195 lb 5.2 oz)   Elderly BF NAD Puffy Line in neck (EJ) Lungs with crackles left more than right but ant clear Abdomen soft NT 1+edema right leg Trace to 1+ left leg Dressing intact right foot  Labs:   Lab 02/21/12 0609 02/20/12 0500 02/19/12 0830 02/18/12 0849 02/16/12 0658 02/15/12 0345  NA 135 132* 133* 131* 131* 135  K 3.7 3.7 3.9 3.6 4.0 3.4*  CL 99 95* 96 93* 94* 98  CO2 24 21 20 20 23 24   GLUCOSE 194* 160* 109* 116* 203* 160*  BUN 71* 76* 79* 79* 74* 72*  CREATININE 2.28* 2.82* 2.68* 2.57* 1.73* 1.48*  ALB -- -- -- -- -- --  CALCIUM 8.8 8.9 8.9 9.0 8.8 9.0  PHOS 4.1 5.0* -- -- -- --     Lab 02/21/12 0609 02/20/12 0500  AST -- --  ALT -- --  ALKPHOS -- --  BILITOT -- --  PROT -- --  ALBUMIN 2.0* 2.0*     Lab 02/20/12 0710 02/16/12 0629 02/15/12 0345  WBC 14.0* 19.9* 21.2*  NEUTROABS 10.5* -- --  HGB 9.1* 10.4* 10.3*  HCT 28.1* 32.0* 31.1*  MCV 84.6 83.8  83.8  PLT 379 343 374     Lab 02/20/12 0710  CKTOTAL 8  CKMB --  CKMBINDEX --  TROPONINI --     Lab 02/21/12 1750 02/21/12 1222 02/21/12 0728 02/20/12 1959 02/20/12 1722  GLUCAP 185* 185* 171* 134* 117*       Lab 02/20/12 0710  IRON 58  TIBC 176*  TRANSFERRIN --  FERRITIN 103   Results for Andrea, [name] (MRN [id number]) as of 02/20/2012 19:37  Ref. Range 02/19/2012 08:30 02/20/2012 07:10  Vancomycin Rm No range found 32.8 30.3  Results for Andrea, [name] (MRN [id number]) as of 02/21/2012 18:57  Ref. Range 02/20/2012 07:10  ANA Latest Range: NEGATIVE  NEGATIVE  Results for Andrea, [name] (MRN [id number]) as of 02/21/2012 18:57  Ref. Range 02/20/2012 07:10  C3 Complement Latest Range: 90-180 mg/dL 102  Complement C4, Body Fluid Latest Range: 10-40 mg/dL 31   Studies/Results: No results found.       Marland Kitchen albuterol  2.5  mg Nebulization BID  . antiseptic oral rinse  15 mL Mouth Rinse q12n4p  . aspirin EC  325 mg Oral Daily  . brimonidine  1 drop Left Eye BID  . brinzolamide  1 drop Left Eye BID  . carvedilol  3.125 mg Oral Q breakfast  . chlorhexidine  15 mL Mouth/Throat BID  . docusate sodium  100 mg Oral BID  . gabapentin  100 mg Oral QHS  . insulin aspart  0-15 Units Subcutaneous TID WC  . insulin aspart  0-5 Units Subcutaneous QHS  . insulin glargine  10 Units Subcutaneous QHS  . ipratropium  0.5 mg Nebulization BID  . metoCLOPramide  5 mg Oral TID WC  . multivitamin with minerals  1 tablet Oral Daily  . sodium chloride  3 mL Intravenous Q12H  . sodium chloride  3 mL Intravenous Q12H     I  have reviewed scheduled and prn medications.  ASSESSMENT/RECOMMENDATIONS  72 yo female with a background of DM, HTN, CKD3 (suspect due to DM) with AKI on CKD in the setting of HCAP, toe gangrene/osteo requiring ray amputation, MRSA bacteremia, possible HCAP, supratherapeutic vancomycin levels and CHF.  CKD with AKI on CKD (baseline 1.3-1.5) Probable ATN Supratherapeutic  vanco may have contributed along with hemodynamic issues of CHF Vanco still high when last measured (1/22) UOP has picked up considerably with lasix but still volume overloaded Creatinine has reached plateau and is starting to fall  ANA negative Complements normal  Recommend continue IV lasix for volume excess for at least another 24 hours Continue to follow creatinine ID will change to cubicin for MRSA for shortened course (TEE negative) when vanco <15   Camille Bal, MD [facility] [phone] Pager 02/21/2012, 6:59 PM

## 2012-02-21 NOTE — Progress Notes (Signed)
Clinical Social Worker continuing to follow for discharge planning. Pt is from [facility] and plan is for pt to return when medically stable for discharge. Clinical Social Worker contacted facility and updated facility. Clinical Social Worker to continue to follow for discharge planning.   Jacklynn Lewis, MSW, LCSWA  Clinical Social Work [phone]

## 2012-02-21 NOTE — Progress Notes (Signed)
Speech Language Pathology Dysphagia Treatment Patient Details Name: [name] MRN: [id number] DOB: 07-09-1940 Today's Date: 02/21/2012 Time: 4098-1191 SLP Time Calculation (min): 30 min  Assessment / Plan / Recommendation Clinical Impression  Pt seen to assess tolerance of po diet, note she is s/p esophageal evaluation with findings of dysmotility and mild stricturing at distal esophagus.  Tx for esophageal issues not indicated per GI and pt not reporting dysphagia symptoms.  SLP was concerned re: pt's aspiration risk with esophageal issues and pulmonary issue upon admit.  Pt is not coughing during today's session as she did previously, possibly indicating improved respiratory status.   Pt has been intermittently eating (when not having procedures completed) over the last few days without apparent difficulties per chart review.    Today was seen after returning from TEE, fullly alert but she demonstrated mild oral manipulation deficits resulting in retained cracker in oral cavity.  She is also impulsive and takes large boluses- put full cracker in mouth.  Pt acknowledged pureed items were easier to consume than solids and she agrees to pureed diet short term with compensatory strategies to maximize airway protection.   No coughing or clinical indicators of aspiration with water, cracker, icecream, mashed potatoes, and few bites [place], greenbeans.  Pt educated to precautions, including consuming liquids during meals to aid clearance, eating small frequent meals, staying upright for 30 minutes after meals and taking rest breaks if short of breath or overtly coughing.   Pt verbalized understanding to recommendations and precautions however she will benefit from reinforcement and assist to eat due to her blindess/impulsivity.     SLP to follow for diet tolerance and readiness for advancement.  Hopeful to advance with continued medical improvement.  Thanks.    Diet Recommendation  Initiate / Change  Diet: Thin liquid;Dysphagia 1 (puree)    SLP Plan Continue with current plan of care   Pertinent Vitals/Pain Afebrile, lungs decr, intake had been 100% previously in hospital stay, pt states she "stays hungry"   Swallowing Goals  SLP Swallowing Goals Swallow Study Goal #2 - Progress: Progressing toward goal  General Temperature Spikes Noted: No Respiratory Status: Supplemental O2 delivered via (comment) Behavior/Cognition: Alert;Impulsive (easily agitated) Patient Positioning: Partially reclined (pt declined to allow [facility] elevated further)  Oral Cavity - Oral Hygiene   oral cavity clear  Dysphagia Treatment Treatment focused on: Skilled observation of diet tolerance;Patient/family/caregiver education;Utilization of compensatory strategies Family/Caregiver Educated: pt Treatment Methods/Modalities: Skilled observation Patient observed directly with PO's: Yes Type of PO's observed: Dysphagia 1 (puree);Thin liquids Feeding: Needs assist (pt is blind, self fed icecream but needed assist for other) Liquids provided via: Straw Pharyngeal Phase Signs & Symptoms: Delayed throat clear (minimal delayed throat clearing a drinking water w/HOB low) Type of cueing: Verbal;Tactile Amount of cueing: Moderate   GO     Donavan Burnet, MS [facility] SLP [phone]

## 2012-02-21 NOTE — Progress Notes (Signed)
[facility] PROGRESS NOTE  [name] WUJ:[id number] DOB: 06-22-1940 DOA: 02/14/2012 PCP: No primary provider on file.  Assessment/Plan: Sepsis  -secondary to MRSA Bacteremia  Left Basilar Opacity--?PNA Given significant hypoxia , tachycardia and leukocytosis patient was admitted to step down monitoring. Patient started on empiric antibiotic IV vancomycin. Blood cultures sent on admission growing MRSA (1/16) on both sets.  -patient remains afebrile. Leukocytosis slowly improving.  -Flu negative  -Continue vancomycin  MRSA bacteremia  -TEE 02/21/12--no gross vegetations  -no clear source except necrotic right 2nd toe which was amputated on 1/20. TTEshows no vegetations.  -vancomycin. ( currently held for supratherapeutic levels)  -.Appreciate ID recs.  -Has a necrotic right second toe. MRI of the foot showed osteomyelitis. Seen by orthopedic consult and had amputation of the right second toe done on 1/20 and tolerated procedure well.  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 Montez Morita Acute on Chronic kidney disease  -nonoliguric  -Serum creatinine has improved with diuresis - neg 1860 cc with 4 doses of intravenous furosemide, remains positive fluid balance for the admission -Further furosemide dosing per renal service -FeNa calculated was less than 1. Not improved with IV hydration. -Renal ultrasound unremarkable. Repeated proBNP was markedly elevated at 14,005.  -Chest x-ray obtained showed vascular congestion  -2D echo on admission showed mild diastolic dysfn.  -Appreciate nephrology, continue furosemide IV as outlined  -held vancomycin given elevated levels. -Vancomycin level random= 30.3 on  02/20/2012 -Avoid NSAIDs nephrotoxic agents.  Acute CHF--diastolic  -Ejection fraction 55-60%  -Clinically remains hypervolemic  -Continue furosemide as discussed  Acute respiratory failure  Likely due to volume overload Diabetes mellitus  Continue sliding scale insulin and Lantus.  -CBGs controlled Hypertension  Currently stable. Continue home medications-coreg  Dysphagia/esophageal dysmotility  Seen by speech and swallow and MBS done today which showed esophageal dysmotility.  -Patient informs having esophagial dilatation done in 2001 by Dr. Arlyce Dice.  -Appreciate GI recommendations  On dys level 1 diet--speech continues to follow for safest diet    IV access--PICC not good option due to vein sclerosis for future HD -triple lumen if SNF accepts it  Family Communication: none at bedside . Patient's legal guardian [name] was called and updated plan on 1/21  Disposition Plan: back to SNF once stable . Ordered PT eval   Consultants:  [facility]  ID  Orthopedics  [facility] Procedures:  Modified barium swallow ( 1/18)  Right second toe amputation on 1/20  Standard barium swallow ordered    Principal Problem:  *HCAP (healthcare-associated pneumonia) Active Problems:  DM  BLINDNESS, BILATERAL  ESSENTIAL HYPERTENSION  SOB (shortness of breath)  Cough  Acute respiratory failure  Leukocytosis  Bacteremia due to Gram-positive bacteria  MRSA bacteremia  Necrotic toes  Oliguria    Family Communication:   Pt at beside Disposition Plan:   Home when medically stable   Antibiotics:  Vancomycin 02/14/2012>>>    Procedures/Studies: Dg Chest 2 View  02/19/2012  *RADIOLOGY REPORT*  Clinical Data: Weakness.  Evaluate for pulmonary edema  CHEST - 2 VIEW  Comparison: None 02/17/2012  Findings: This examination is technically limited by body habitus and patient positioning (semiupright) Cardiomegaly is stable. There is bilateral pulmonary vascular congestion.  There is  a patchy left retrocardiac opacity.  Hazy opacity the right lung  base may be due to overlying soft tissues of the breast. On the lateral view, the posterior costophrenic angles are obscured by the table, as the patient was in semiupright position.  IMPRESSION: Cardiomegaly and mild pulmonary vascular congestion. Image detail limited by patient body habitus and positioning. Small left basilar opacity could be due to atelectasis or airspace disease. Small bilateral pleural effusions cannot be excluded, as described above.   Original Report Authenticated By: Britta Mccreedy, M.D.    Dg Chest 2 View  02/17/2012  *RADIOLOGY REPORT*  Clinical Data: Increased cough and shortness of breath.  Question fluid overload.  CHEST - 2 VIEW  Comparison: Chest radiograph 02/15/2012.  Findings: Stable cardiomegaly.  There is diffuse pulmonary vascular congestion with interstitial prominence in the perihilar regions of lung bases.  Patchy airspace opacities in the left mid lung.  Small bilateral pleural effusions.  Thickening of the fissures on the lateral view.  IMPRESSION: Mild congestive heart failure pattern with small bilateral pleural effusions.   Original Report Authenticated By: Britta Mccreedy, M.D.    Ct Head Wo Contrast  02/14/2012  *RADIOLOGY REPORT*  Clinical Data: Altered mental status.  History of weakness and TIA.  CT HEAD WITHOUT CONTRAST  Technique:  Contiguous axial images were obtained from the base of the skull through the vertex without contrast.  Comparison: 12/25/2008  Findings: Bone windows demonstrate similar abnormal appearance of the right globe, presumably related to prior trauma or hemorrhage. There are also similar calcifications about the left globe.  Mucosal thickening of the sphenoid sinus and ethmoid air cells.  A left mastoid effusion is again identified.  Soft tissue windows demonstrate similar moderate hydrocephalus, including the fourth, third, and lateral ventricles.  Slight increase in  periventricular white matter hypoattenuation.  Redemonstration of right frontal hypoattenuation which is felt to be similar, including on image 20/series 2.  No mass lesion, hemorrhage, intra-axial, or extra-axial fluid collection.  IMPRESSION:  1.  Similar hydrocephalus. 2.  Slight increase in periventricular white matter hypoattenuation.  Favor small vessel ischemic change.  A component of transependymal CSF resorption could look similar. 3.  Right frontal lobe remote infarct. 4.  Sinus disease with a left mastoid effusion.   Original Report Authenticated By: Jeronimo Greaves, M.D.    Dg Esophagus  02/19/2012  *RADIOLOGY REPORT*  Clinical Data: Dilated esophagus on modified barium swallow.  The patient reports no dysphagia or difficulty edema drinking.  ESOPHOGRAM/BARIUM SWALLOW  Technique:  Single contrast examination was performed using thin barium.  Fluoroscopy time:  2.3 minutes.  Comparison:  None.  Findings:  Exam is somewhat limited due to patient immobility. Patient had a toe amputation 1 day prior.  The esophagus is patulous.  There is significant esophageal dysmotility with tertiary contractions and to-and-fro motion of the barium bolus.  There is some mild irregularity at the GE junction with mild narrowing.  A 13 mm barium tablet did pass GE junction easily.  IMPRESSION:  1.  Significant esophageal dysmotility with tertiary contractions and to-and-fro motion of the barium bolus.  Patulous esophagus.  2.  Some mild stricturing of the distal esophagus.  Cannot exclude a mucosal irregularity. 3.  Barium tablet passed through the GE junction easily.   Original Report Authenticated By: Genevive Bi, M.D.    US Renal  02/17/2012  *RADIOLOGY REPORT*  Clinical Data: Acute renal insufficiency with decreased urine output.  RENAL/URINARY TRACT ULTRASOUND COMPLETE  Comparison:  CT 06/26/2011  Findings:  Right Kidney:  12.9 cm. No hydronephrosis.  Small  renal calculi identified. Normal renal cortical thickness  and echogenicity.  Left Kidney:  11.9 cm. No hydronephrosis.  Small renal calculi identified. Normal renal cortical thickness and echogenicity.  Bladder:  Collapsed around a Foley catheter.  IMPRESSION: No hydronephrosis.  Renal collecting system calculi, as before.   Original Report Authenticated By: Jeronimo Greaves, M.D.    Mr Foot Right Wo Contrast  02/17/2012  *RADIOLOGY REPORT*  Clinical Data: Necrotic second toe.  MRI OF THE RIGHT FOREFOOT WITHOUT CONTRAST  Technique:  Multiplanar, multisequence MR imaging was performed. No intravenous contrast was administered.  Comparison: Radiographs 02/17/2012.  Findings: Abnormal signal intensity in the distal phalanx of the second toe suspicious for osteomyelitis.  No findings to suggest septic arthritis.  There is mild diffuse cellulitis and diffuse myofasciitis.  No focal soft tissue abscess.  IMPRESSION:  1.  Osteomyelitis involving the distal phalanx of the second toe. 2.  Cellulitis and myofasciitis without focal soft tissue abscess.   Original Report Authenticated By: Rudie Meyer, M.D.    Portable Chest 1 View  02/15/2012  *RADIOLOGY REPORT*  Clinical Data: CHF.  PORTABLE CHEST - 1 VIEW  Comparison: 02/14/2012  Findings: Shallow inspiration.  Cardiac enlargement with pulmonary vascular congestion and perihilar infiltration suggesting edema or pneumonia.  The infiltration is slightly more prominent than previous study.  No pneumothorax or effusion.  IMPRESSION: Cardiac enlargement with pulmonary vascular congestion.  Increasing perihilar infiltrates or edema bilaterally.   Original Report Authenticated By: Burman Nieves, M.D.    Dg Chest Portable 1 View  02/14/2012  *RADIOLOGY REPORT*  Clinical Data: Shortness of breath and diabetes.  Hypertension.  PORTABLE CHEST - 1 VIEW  Comparison: Two-view chest x-ray and CT of the chest 06/22/2011.  Findings: The heart is mildly enlarged.  Lung volumes are low. Mild interstitial and airspace disease is present  bilaterally. The bone windows are unremarkable.  IMPRESSION:  1.  Mild cardiomegaly and edema is concerning for congestive heart failure. 2.  There is some airspace disease bilaterally which raises the possibility of infection as well.   Original Report Authenticated By: Marin Roberts, M.D.    Dg Foot 2 Views Right  02/17/2012  *RADIOLOGY REPORT*  Clinical Data: Necrotic second toe right foot.  History of diabetes.  Staph bacteremia.  RIGHT FOOT - 2 VIEW  Comparison: None.  Findings: Underlying renal osteodystrophy with vascular calcifications.  Only AP and lateral radiographs performed; no oblique imaging.  Overlap of the digits on the lateral view. Question soft tissue ulcer about the lateral aspect of the second toe distally on AP view.  No gross osseous destruction.  No callus deposition.  IMPRESSION: Limited 2 view study, demonstrating no gross osseous destruction. Possible soft tissue defect about the second toe laterally.  If ongoing clinical concern, consider contrast enhanced MRI.   Original Report Authenticated By: Jeronimo Greaves, M.D.    Dg Swallowing Func-speech Pathology  02/16/2012  Chales Abrahams, CCC-SLP     02/16/2012 10:47 AM Objective Swallowing Evaluation: Modified Barium Swallowing Study   Patient Details  Name: [name] MRN: [id number] Date of Birth: 08-Dec-1940  Today's Date: 02/16/2012 Time: 0950-1015 SLP Time Calculation (min): 25 min  Past Medical History:  Past Medical History  Diagnosis Date  . Hypertension   . Diabetes mellitus   . Blindness   . TIA (transient ischemic attack)   . Anemia   . GERD (gastroesophageal reflux disease)   . Renal disorder   . Constipation    Past Surgical History:  Past  Surgical History  Procedure Date  . Gallbladder surgery   . Esophageal dilation   . Cholecystectomy    HPI:  72 yo female adm to [facility] from [facility] SNF with  hypoxia, CXR + fluid, +/- pna- diagnosed with HCAP, ? CHF (await  BNP).  Per MD notes, pt told MD that she  "always has pna."   PMH  + for TIA, GERD, anemia, constipation, HTN, blindness/HOH. SLP  phoned SNF SLP who reports she has not seen pt, she has however  seen the pt's roommate during meals and has not noted pt coughing  during meals.  Per SNF SLP, pt was recently "flagged for weight  loss" .  Esophageal dilatation completed in 03/1999 and pt states  she had problems swallowing before surgery that resolved after.    BSE completed Thursday pm and follow up Friday with  recommendation for MBS due to inability to rule out  dysphagia/aspiration at bedside with pt overtly coughing.     Assessment / Plan / Recommendation Clinical Impression  Dysphagia Diagnosis: Suspected primary esophageal dysphagia;Mild  oral phase dysphagia;Mild pharyngeal phase dysphagia   Clinical impression: Pt presents with mild oropharyngeal and  suspected primary esophageal dysphagia.  Decreased oral control  results in premature spillage of liquids into pharynx and  piecemeal deglutition with solids/pudding as well as oral stasis  with poor awareness.  Cued swallow facilitated timeliness of  swallow.  Pharyngeal dysphagia with delayed swallow initiation to  pyriform sinus with liquids, to vallecular space with  solid/pudding and  resultant trace silent penetration of liquids.   Cued throat clear removed trace penetrates but pt requiried  verbal cues x4 to complete and became agitated.  Pt prematurely  spills pudding/cracker filling entire vallecular space for longer  than 30 seconds as she continued to orally manipulate remainder  of bolus before she actually triggers a swallow.  No aspiration  or penetration with pudding/cracker noted.  Pt did not arrive to  test with her dentures unfortunately, therefore cracker was  softened.       Suspect esophageal deficits significantly contribute to  oropharyngeal deficits.  Appearance of prominent cricopharyngeus  with retention of secretions x1, CP did not impact barium flow.   However pt did appear with  stasis from distal to mid esophagus  with distal narrowing.  Above area of narrowed region, esophagus  appeared dilated (? Motility issues).  Retrograde propulsion of  barium occurred WITHOUT pt awareness.   Suspect pt's primary  dysphagia is esophageal.    Pt was instructed to compensatory strategies, testing findings  and recommendations during the procedure. Pt may benefit from GI  referral for esophageal dysphagia management (h/o esophageal  dilatation in 2001).  In the interim time, rec liquid diet in  small amounts for maximal airway protection and medications be  crushed given with liquids if not contraindicated.  SLP to  follow.  Thanks.       Treatment Recommendation  Therapy as outlined in treatment plan below    Diet Recommendation Thin liquid (All Liquids)   Liquid Administration via: Cup;Straw Medication Administration: Other (Comment) (crush with liquids) Supervision: Full supervision/cueing for compensatory strategies Compensations: Small sips/bites;Slow rate;Clear throat  intermittently Postural Changes and/or Swallow Maneuvers: Seated upright 90  degrees;Upright 30-60 min after meal    Other  Recommendations Recommended Consults: Consider GI  evaluation Oral Care Recommendations: Oral care BID   Follow Up Recommendations       Frequency and Duration min 2x/week  1  week   Pertinent Vitals/Pain     SLP Swallow Goals Patient will utilize recommended strategies during swallow to  increase swallowing safety with: Total assistance   General HPI: 72 yo female adm to [facility] from [facility]  SNF with hypoxia, CXR + fluid, +/- pna- diagnosed with HCAP, ?  CHF (await BNP).  Per MD notes, pt told MD that she "always has  pna."   PMH + for TIA, GERD, anemia, constipation, HTN,  blindness/HOH. SLP phoned SNF SLP who reports she has not seen  pt, she has however seen the pt's roommate during meals and has  not noted pt coughing during meals.  Per SNF SLP, pt was recently  "flagged for weight loss" .   Esophageal dilatation completed in  03/1999 and pt states she had problems swallowing before surgery  that resolved after.   BSE completed Thursday pm and follow up  Friday with recommendation for MBS due to inability to rule out  dysphagia/aspiration at bedside with pt overtly coughing. Type of Study: Modified Barium Swallowing Study Reason for Referral: Objectively evaluate swallowing function Diet Prior to this Study: Nectar-thick liquids;Regular Temperature Spikes Noted: No Respiratory Status: Supplemental O2 delivered via (comment) Behavior/Cognition: Alert;Cooperative;Requires cueing;Decreased  sustained attention;Agitated (easily agitated when asked  questions) Oral Cavity - Dentition: Dentures, not available (dentures left  in pt's room) Oral Motor / Sensory Function: Impaired - see Bedside swallow  eval Self-Feeding Abilities: Needs assist Patient Positioning: Upright in chair Baseline Vocal Quality: Clear Volitional Swallow: Unable to elicit (pt states she didn't have  anything to swallow) Anatomy: Within functional limits Pharyngeal Secretions: Not observed secondary MBS    Reason for Referral Objectively evaluate swallowing function   Oral Phase Oral Preparation/Oral Phase Oral Phase: Impaired Oral - Nectar Oral - Nectar Teaspoon:  (decr oral control results in premature  spillage ) Oral - Nectar Cup:  (decr oral control results in premature  spillage) Oral - Thin Oral - Thin Teaspoon:  (decr oral control results in premature  spillage) Oral - Thin Cup:  (decr oral control results in premature  spillage) Oral - Thin Straw:  (decr oral control results in premature  spillage) Oral - Solids Oral - Puree: Weak lingual manipulation;Delayed oral  transit;Piecemeal swallowing;Lingual/palatal residue Oral - Regular: Piecemeal swallowing;Weak lingual  manipulation;Delayed oral transit;Impaired  mastication;Lingual/palatal residue Oral Phase - Comment Oral Phase - Comment: decr oral control results in premature   spillage of all liquids, pt did not have dentures present for  exam- cracker was softened with thin barium and pudding   Pharyngeal Phase Pharyngeal Phase Pharyngeal Phase: Impaired Pharyngeal - Nectar Pharyngeal - Nectar Teaspoon: Premature spillage to valleculae Pharyngeal - Nectar Cup: Delayed swallow initiation;Premature  spillage to pyriform sinuses Pharyngeal - Thin Pharyngeal - Thin Teaspoon: Premature spillage to pyriform  sinuses;Penetration/Aspiration during swallow Penetration/Aspiration details (thin teaspoon): Material enters  airway, remains ABOVE vocal cords then ejected out Pharyngeal - Thin Cup: Premature spillage to pyriform  sinuses;Penetration/Aspiration during swallow Penetration/Aspiration details (thin cup): Material enters  airway, CONTACTS cords and not ejected out (cued throat clear  removed trace amount of penetration) Pharyngeal - Thin Straw: Premature spillage to pyriform sinuses Pharyngeal - Solids Pharyngeal - Puree: Delayed swallow initiation;Premature spillage  to pyriform sinuses (retention of barium at vallecular space  before swlalow) Pharyngeal - Regular: Premature spillage to valleculae;Delayed  swallow initiation (retention of cracker at vallecular space  before swallow)  Cervical Esophageal Phase    GO    Cervical Esophageal Phase  Cervical Esophageal Phase: Impaired Cervical Esophageal Phase - Comment Cervical Esophageal Comment: appearance of prominent  cricopharyngeus with retention of secretions x1, did not impact  barium flow, appearance of stasis with distal narrowing and  appearance of dilated esophagus above narrowed region, retrograde  propulsion of barium appeared WITHOUT pt awareness          Donavan Burnet, MS [facility] SLP [phone]           Subjective:   Objective: Filed Vitals:   02/21/12 1110 02/21/12 1140 02/21/12 1144 02/21/12 1401  BP: 150/68   140/60  Pulse: 69   68  Temp: 98.9 F (37.2 C)   97.6 F (36.4 C)  TempSrc: Oral   Oral  Resp: 18   18   Height:      Weight:      SpO2: 94% 88% 97% 100%    Intake/Output Summary (Last 24 hours) at 02/21/12 1613 Last data filed at 02/21/12 1407  Gross per 24 hour  Intake    480 ml  Output   3750 ml  Net  -3270 ml   Weight change:  Exam:   General:  Pt is alert, follows commands appropriately, not in acute distress  HEENT: No icterus, No thrush, No neck mass, [place]/AT  Cardiovascular: RRR, S1/S2, no rubs, no gallops  Respiratory: CTA bilaterally, no wheezing, no crackles, no rhonchi  Abdomen: Soft/+BS, non tender, non distended, no guarding  Extremities: No edema, No lymphangitis, No petechiae, No rashes, no synovitis  Data Reviewed: Basic Metabolic Panel:  Lab 02/21/12 4540 02/20/12 0500 02/19/12 0830 02/18/12 0849 02/16/12 0658  NA 135 132* 133* 131* 131*  K 3.7 3.7 3.9 3.6 4.0  CL 99 95* 96 93* 94*  CO2 24 21 20 20 23   GLUCOSE 194* 160* 109* 116* 203*  BUN 71* 76* 79* 79* 74*  CREATININE 2.28* 2.82* 2.68* 2.57* 1.73*  CALCIUM 8.8 8.9 8.9 9.0 8.8  MG -- -- -- -- --  PHOS 4.1 5.0* -- -- --   Liver Function Tests:  Lab 02/21/12 0609 02/20/12 0500  AST -- --  ALT -- --  ALKPHOS -- --  BILITOT -- --  PROT -- --  ALBUMIN 2.0* 2.0*   No results found for this basename: LIPASE:5,AMYLASE:5 in the last 168 hours No results found for this basename: AMMONIA:5 in the last 168 hours CBC:  Lab 02/20/12 0710 02/16/12 0629 02/15/12 0345  WBC 14.0* 19.9* 21.2*  NEUTROABS 10.5* -- --  HGB 9.1* 10.4* 10.3*  HCT 28.1* 32.0* 31.1*  MCV 84.6 83.8 83.8  PLT 379 343 374   Cardiac Enzymes:  Lab 02/20/12 0710  CKTOTAL 8  CKMB --  CKMBINDEX --  TROPONINI --   BNP: No components found with this basename: POCBNP:5 CBG:  Lab 02/21/12 1222 02/21/12 0728 02/20/12 1959 02/20/12 1722 02/20/12 1153  GLUCAP 185* 171* 134* 117* 123*    Recent Results (from the past 240 hour(s))  CULTURE, BLOOD (ROUTINE X 2)     Status: Normal   Collection Time   02/14/12 10:35 AM       Component Value Range Status Comment   Specimen Description BLOOD RIGHT HAND   Final    Special Requests BOTTLES DRAWN AEROBIC ONLY   Final    Culture  Setup Time 02/14/2012 14:49   Final    Culture     Final    Value: METHICILLIN RESISTANT STAPHYLOCOCCUS AUREUS     Note: RIFAMPIN AND GENTAMICIN SHOULD NOT BE  USED AS SINGLE DRUGS FOR TREATMENT OF STAPH INFECTIONS. CRITICAL RESULT CALLED TO, READ BACK BY AND VERIFIED WITH: BETHANY STRINGER 02/16/12 @ 8:22PM BY RUSCA.     Note: Gram Stain Report Called to,Read Back By and Verified With: JESSICA CONRAD 02/15/12 1440 BY SMITHERSJ   Report Status 02/17/2012 FINAL   Final    Organism ID, Bacteria METHICILLIN RESISTANT STAPHYLOCOCCUS AUREUS   Final   CULTURE, BLOOD (ROUTINE X 2)     Status: Normal   Collection Time   02/14/12 10:45 AM      Component Value Range Status Comment   Specimen Description BLOOD RIGHT FOREARM   Final    Special Requests BOTTLES DRAWN AEROBIC ONLY   Final    Culture  Setup Time 02/14/2012 14:49   Final    Culture     Final    Value: METHICILLIN RESISTANT STAPHYLOCOCCUS AUREUS     Note: SUSCEPTIBILITIES PERFORMED ON PREVIOUS CULTURE WITHIN THE LAST 5 DAYS. CRITICAL RESULT CALLED TO, READ BACK BY AND VERIFIED WITH: BETHANY STRINGER 02/16/12 @ 8:22PM BY RUSCA.     Note: CRITICAL RESULT CALLED TO, READ BACK BY AND VERIFIED WITH: MILLIE PRICE 02/16/12 @ 10:55AM BY RUSCA.   Report Status 02/17/2012 FINAL   Final   URINE CULTURE     Status: Normal   Collection Time   02/14/12 10:46 AM      Component Value Range Status Comment   Specimen Description URINE, CATHETERIZED   Final    Special Requests NONE   Final    Culture  Setup Time 02/14/2012 18:58   Final    Colony Count NO GROWTH   Final    Culture NO GROWTH   Final    Report Status 02/15/2012 FINAL   Final   MRSA PCR SCREENING     Status: Normal   Collection Time   02/14/12  3:08 PM      Component Value Range Status Comment   MRSA by PCR NEGATIVE  NEGATIVE Final    CULTURE, BLOOD (ROUTINE X 2)     Status: Normal (Preliminary result)   Collection Time   02/16/12  4:30 PM      Component Value Range Status Comment   Specimen Description BLOOD RIGHT HAND  3 ML IN [facility] BOTTLE   Final    Special Requests NONE   Final    Culture  Setup Time 02/16/2012 23:17   Final    Culture     Final    Value:        BLOOD CULTURE RECEIVED NO GROWTH TO DATE CULTURE WILL BE HELD FOR 5 DAYS BEFORE ISSUING A FINAL NEGATIVE REPORT   Report Status PENDING   Incomplete   CULTURE, BLOOD (ROUTINE X 2)     Status: Normal (Preliminary result)   Collection Time   02/16/12  4:48 PM      Component Value Range Status Comment   Specimen Description BLOOD RIGHT ARM  2 ML IN [facility] BOTTLE   Final    Special Requests NONE   Final    Culture  Setup Time 02/16/2012 23:16   Final    Culture     Final    Value:        BLOOD CULTURE RECEIVED NO GROWTH TO DATE CULTURE WILL BE HELD FOR 5 DAYS BEFORE ISSUING A FINAL NEGATIVE REPORT   Report Status PENDING   Incomplete   SURGICAL PCR SCREEN     Status: Normal   Collection Time  02/18/12  5:25 AM      Component Value Range Status Comment   MRSA, PCR NEGATIVE  NEGATIVE Final    Staphylococcus aureus NEGATIVE  NEGATIVE Final   URINE CULTURE     Status: Normal   Collection Time   02/19/12 10:40 AM      Component Value Range Status Comment   Specimen Description URINE, CATHETERIZED   Final    Special Requests NONE   Final    Culture  Setup Time 02/20/2012 01:28   Final    Colony Count NO GROWTH   Final    Culture NO GROWTH   Final    Report Status 02/20/2012 FINAL   Final      Scheduled Meds:   . albuterol  2.5 mg Nebulization BID  . antiseptic oral rinse  15 mL Mouth Rinse q12n4p  . aspirin EC  325 mg Oral Daily  . brimonidine  1 drop Left Eye BID  . brinzolamide  1 drop Left Eye BID  . carvedilol  3.125 mg Oral Q breakfast  . chlorhexidine  15 mL Mouth/Throat BID  . docusate sodium  100 mg Oral BID  . gabapentin  100 mg Oral QHS  .  insulin aspart  0-15 Units Subcutaneous TID WC  . insulin aspart  0-5 Units Subcutaneous QHS  . insulin glargine  10 Units Subcutaneous QHS  . ipratropium  0.5 mg Nebulization BID  . metoCLOPramide  5 mg Oral TID WC  . multivitamin with minerals  1 tablet Oral Daily  . sodium chloride  3 mL Intravenous Q12H  . sodium chloride  3 mL Intravenous Q12H   Continuous Infusions:    Henny Strauch, DO  [facility] Pager [phone]  If 7PM-7AM, please contact night-coverage [email or web address] Password [facility] 02/21/2012, 4:13 PM   LOS: 7 days

## 2012-02-21 NOTE — Progress Notes (Signed)
Nutrition Brief Note  Patient identified on the Malnutrition Screening Tool (MST) Report  Body mass index is 39.44 kg/(m^2). Patient meets criteria for class II obesity based on current BMI.   Current diet order is dysphagia 1, thin liquid, patient is consuming approximately 50-100% of meals at this time. Labs and medications reviewed. Met with pt who reports eating well PTA, 2-3 meals/day with good appetite and stable weight. Pt eating well recently, 80-100% of meals. Pt denies need for nutritional supplements. Pt being followed by SLP.   No nutrition interventions warranted at this time. If nutrition issues arise, please consult RD.   Levon Hedger MS, RD, LDN [phone] Pager [phone] After Hours Pager

## 2012-02-21 NOTE — Progress Notes (Signed)
First-degree heart block

## 2012-02-21 NOTE — OR Nursing (Signed)
Patient given 3 mg IV versed, and 50 mg IV fentanyl, and became unresponsive without spontaneous respirations.  Patient bagged with oral airway placed.  0.5 mg IV Romazicon given.  Patient responded with spontaneous respirations with a pulse ox of 100%.

## 2012-02-21 NOTE — Progress Notes (Signed)
PT Cancellation Note  Patient Details Name: [name] MRN: [id number] DOB: 11/28/1940   Cancelled Treatment:    Reason Eval/Treat Not Completed:  (Pt. requires total lift for OOB PTA  And PT is not indicated in acute care at this time. See note from 02/19/11   Rada Hay 02/21/2012, 5:53 [id number]

## 2012-02-21 NOTE — CV Procedure (Signed)
INDICATIONS:   The patient is 72 years old female with bacteremia and r/o endocarditis.  PROCEDURE:  Informed consent was discussed including risks, benefits and alternatives for the procedure.  Risks include, but are not limited to, cough, sore throat, vomiting, nausea, somnolence, esophageal and stomach trauma or perforation, bleeding, low blood pressure, aspiration, pneumonia, infection, trauma to the teeth and death.    Patient was given sedation.  The oropharynx was anesthetized with topical lidocaine.  The transesophageal probe was insertion was stopped due to adverse reaction to IV sedation/Versed(1 + 1 + 1=3 mg) and fentanyl 25 mcg). I immediately, manually gave breaths with bag valve mask device and nurse gave 0.5 mg. of Romazicon to successfully reverse the versed effect. ThenTransthoracic echocardiogram was done and no gross vegetations were seen on the valves. EF was 50 %. There was moderate MR with mild TR and mild to moderate pulmonary hypertension  Agitated saline was used with no right to left shunt. The patient was kept under observation until the patient left the procedure room.  The patient left the procedure room in stable condition.   COMPLICATIONS:  There was adverse effect of versed sedation as respiratory arrest without loss of pulse or rhythm and after Romazicon 0.5 mg. IV, spontaneous breathing was reestablished in less than 2-3 minutes of immediate bag valve mask use with 100 % O2 saturation. Patient was able to communicate in 3-4 minutes and throat was suctioned off secretions. No further attempt to insert TEE probe was done.  FINDINGS: Transthoracic echocardiogram was done and no gross vegetations were seen on the valves. EF was 50 %. There was moderate MR with mild TR and mild to moderate pulmonary hypertension  Agitated saline was used with no right to left shunt.    IMPRESSION:   1. No gross vegetations of heart valves with mild TR, moderate MR, mildly dilated LA and  intact Inter atrial septum as seen on transthoracic echocardiogram. EF 50 %. 2. Adverse effect of sedation with versed 3. Successful reversal of sedation with Romazicon use.  RECOMMENDATIONS:    Medical treatment with vegetations less likely.

## 2012-02-21 NOTE — Consult Note (Signed)
Subjective:  Here for TEE.   Objective:  Vital Signs in the last 24 hours: Temp:  [97.6 F (36.4 C)-98.6 F (37 C)] 98.6 F (37 C) (01/23 0831) Pulse Rate:  [56-86] 56  (01/23 0831) Cardiac Rhythm:  [-]  Resp:  [16-18] 18  (01/23 0831) BP: (130-150)/(63-70) 150/63 mmHg (01/23 0831) SpO2:  [90 %-98 %] 91 % (01/23 0831)  Physical Exam: BP Readings from Last 1 Encounters:  02/21/12 150/63    Wt Readings from Last 1 Encounters:  02/19/12 97.8 kg (215 lb 9.8 oz)    Weight change:   HEENT: [place]/AT, Eyes-Brown, PERL, EOMI, Conjunctiva-Pale pink, Sclera-Non-icteric Neck: No JVD, No bruit, Trachea midline. Lungs:  Clear, Bilateral. Cardiac:  Regular rhythm, normal S1 and S2, no S3.  Abdomen:  Soft, non-tender. Extremities:  No edema present. No cyanosis. No clubbing. Right foot and ankle in heavy dressing with s/p amputation CNS: AxOx2, Cranial nerves grossly intact, moves all 4 extremities. Right handed. Skin: Warm and dry.   Intake/Output from previous day: 01/22 0701 - 01/23 0700 In: 2050 [P.O.:1200; I.V.:850] Out: 2650 [Urine:2650]    Lab Results: BMET    Component Value Date/Time   NA 135 02/21/2012 0609   K 3.7 02/21/2012 0609   CL 99 02/21/2012 0609   CO2 24 02/21/2012 0609   GLUCOSE 194* 02/21/2012 0609   BUN 71* 02/21/2012 0609   CREATININE 2.28* 02/21/2012 0609   CALCIUM 8.8 02/21/2012 0609   GFRNONAA 21* 02/21/2012 0609   GFRAA 24* 02/21/2012 0609   CBC    Component Value Date/Time   WBC 14.0* 02/20/2012 0710   RBC 3.32* 02/20/2012 0710   HGB 9.1* 02/20/2012 0710   HCT 28.1* 02/20/2012 0710   PLT 379 02/20/2012 0710   MCV 84.6 02/20/2012 0710   MCH 27.4 02/20/2012 0710   MCHC 32.4 02/20/2012 0710   RDW 15.9* 02/20/2012 0710   LYMPHSABS 1.6 02/20/2012 0710   MONOABS 1.2* 02/20/2012 0710   EOSABS 0.6 02/20/2012 0710   BASOSABS 0.1 02/20/2012 0710   CARDIAC ENZYMES Lab Results  Component Value Date   CKTOTAL 8 02/20/2012   CKMB 2.4 06/27/2011   TROPONINI <0.30 02/14/2012     Scheduled Meds:   . albuterol  2.5 mg Nebulization BID  . antiseptic oral rinse  15 mL Mouth Rinse q12n4p  . aspirin EC  325 mg Oral Daily  . brimonidine  1 drop Left Eye BID  . brinzolamide  1 drop Left Eye BID  . carvedilol  3.125 mg Oral Q breakfast  . chlorhexidine  15 mL Mouth/Throat BID  . docusate sodium  100 mg Oral BID  . gabapentin  100 mg Oral QHS  . insulin aspart  0-15 Units Subcutaneous TID WC  . insulin aspart  0-5 Units Subcutaneous QHS  . insulin glargine  10 Units Subcutaneous QHS  . ipratropium  0.5 mg Nebulization BID  . metoCLOPramide  5 mg Oral TID WC  . multivitamin with minerals  1 tablet Oral Daily  . sodium chloride  3 mL Intravenous Q12H  . 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

## 2012-02-21 NOTE — Progress Notes (Signed)
[facility]  Date of Admission:  02/14/2012  Antibiotics: Vancomycin 7 days  vanco trough 1/22 30.3  Subjective: TEE today  Objective: Temp:  [97.6 F (36.4 C)-98.9 F (37.2 C)] 97.6 F (36.4 C) (01/23 1401) Pulse Rate:  [56-86] 68  (01/23 1401) Resp:  [12-19] 18  (01/23 1401) BP: (137-199)/(54-106) 140/60 mmHg (01/23 1401) SpO2:  [88 %-100 %] 100 % (01/23 1401)  General: sleepy but easily arousable CV: RRR without m/r/g Lungs: CTA B   Lab Results Lab Results  Component Value Date   WBC 14.0* 02/20/2012   HGB 9.1* 02/20/2012   HCT 28.1* 02/20/2012   MCV 84.6 02/20/2012   PLT 379 02/20/2012    Lab Results  Component Value Date   CREATININE 2.28* 02/21/2012   BUN 71* 02/21/2012   NA 135 02/21/2012   K 3.7 02/21/2012   CL 99 02/21/2012   CO2 24 02/21/2012    Lab Results  Component Value Date   ALT 24 02/14/2012   AST 40* 02/14/2012   ALKPHOS 174* 02/14/2012   BILITOT 0.8 02/14/2012      Microbiology: Recent Results (from the past 240 hour(s))  CULTURE, BLOOD (ROUTINE X 2)     Status: Normal   Collection Time   02/14/12 10:35 AM      Component Value Range Status Comment   Specimen Description BLOOD RIGHT HAND   Final    Special Requests BOTTLES DRAWN AEROBIC ONLY   Final    Culture  Setup Time 02/14/2012 14:49   Final    Culture     Final    Value: METHICILLIN RESISTANT STAPHYLOCOCCUS AUREUS     Note: RIFAMPIN AND GENTAMICIN SHOULD NOT BE USED AS SINGLE DRUGS FOR TREATMENT OF STAPH INFECTIONS. CRITICAL RESULT CALLED TO, READ BACK BY AND VERIFIED WITH: BETHANY STRINGER 02/16/12 @ 8:22PM BY RUSCA.     Note: Gram Stain Report Called to,Read Back By and Verified With: JESSICA CONRAD 02/15/12 1440 BY SMITHERSJ   Report Status 02/17/2012 FINAL   Final    Organism ID, Bacteria METHICILLIN RESISTANT STAPHYLOCOCCUS AUREUS   Final   CULTURE, BLOOD (ROUTINE X 2)     Status: Normal   Collection Time   02/14/12 10:45 AM      Component Value Range  Status Comment   Specimen Description BLOOD RIGHT FOREARM   Final    Special Requests BOTTLES DRAWN AEROBIC ONLY   Final    Culture  Setup Time 02/14/2012 14:49   Final    Culture     Final    Value: METHICILLIN RESISTANT STAPHYLOCOCCUS AUREUS     Note: SUSCEPTIBILITIES PERFORMED ON PREVIOUS CULTURE WITHIN THE LAST 5 DAYS. CRITICAL RESULT CALLED TO, READ BACK BY AND VERIFIED WITH: BETHANY STRINGER 02/16/12 @ 8:22PM BY RUSCA.     Note: CRITICAL RESULT CALLED TO, READ BACK BY AND VERIFIED WITH: MILLIE PRICE 02/16/12 @ 10:55AM BY RUSCA.   Report Status 02/17/2012 FINAL   Final   URINE CULTURE     Status: Normal   Collection Time   02/14/12 10:46 AM      Component Value Range Status Comment   Specimen Description URINE, CATHETERIZED   Final    Special Requests NONE   Final    Culture  Setup Time 02/14/2012 18:58   Final    Colony Count NO GROWTH   Final    Culture NO GROWTH   Final    Report Status 02/15/2012 FINAL  Final   MRSA PCR SCREENING     Status: Normal   Collection Time   02/14/12  3:08 PM      Component Value Range Status Comment   MRSA by PCR NEGATIVE  NEGATIVE Final   CULTURE, BLOOD (ROUTINE X 2)     Status: Normal (Preliminary result)   Collection Time   02/16/12  4:30 PM      Component Value Range Status Comment   Specimen Description BLOOD RIGHT HAND  3 ML IN [facility] BOTTLE   Final    Special Requests NONE   Final    Culture  Setup Time 02/16/2012 23:17   Final    Culture     Final    Value:        BLOOD CULTURE RECEIVED NO GROWTH TO DATE CULTURE WILL BE HELD FOR 5 DAYS BEFORE ISSUING A FINAL NEGATIVE REPORT   Report Status PENDING   Incomplete   CULTURE, BLOOD (ROUTINE X 2)     Status: Normal (Preliminary result)   Collection Time   02/16/12  4:48 PM      Component Value Range Status Comment   Specimen Description BLOOD RIGHT ARM  2 ML IN [facility] BOTTLE   Final    Special Requests NONE   Final    Culture  Setup Time 02/16/2012 23:16   Final    Culture     Final     Value:        BLOOD CULTURE RECEIVED NO GROWTH TO DATE CULTURE WILL BE HELD FOR 5 DAYS BEFORE ISSUING A FINAL NEGATIVE REPORT   Report Status PENDING   Incomplete   SURGICAL PCR SCREEN     Status: Normal   Collection Time   02/18/12  5:25 AM      Component Value Range Status Comment   MRSA, PCR NEGATIVE  NEGATIVE Final    Staphylococcus aureus NEGATIVE  NEGATIVE Final   URINE CULTURE     Status: Normal   Collection Time   02/19/12 10:40 AM      Component Value Range Status Comment   Specimen Description URINE, CATHETERIZED   Final    Special Requests NONE   Final    Culture  Setup Time 02/20/2012 01:28   Final    Colony Count NO GROWTH   Final    Culture NO GROWTH   Final    Report Status 02/20/2012 FINAL   Final     Studies/Results: Dg Chest 2 View  02/19/2012  *RADIOLOGY REPORT*  Clinical Data: Weakness.  Evaluate for pulmonary edema  CHEST - 2 VIEW  Comparison: None 02/17/2012  Findings: This examination is technically limited by body habitus and patient positioning (semiupright) Cardiomegaly is stable. There is bilateral pulmonary vascular congestion.  There is a patchy left retrocardiac opacity.  Hazy opacity the right lung base may be due to overlying soft tissues of the breast. On the lateral view, the posterior costophrenic angles are obscured by the table, as the patient was in semiupright position.  IMPRESSION: Cardiomegaly and mild pulmonary vascular congestion. Image detail limited by patient body habitus and positioning. Small left basilar opacity could be due to atelectasis or airspace disease. Small bilateral pleural effusions cannot be excluded, as described above.   Original Report Authenticated By: Britta Mccreedy, M.D.    Dg Esophagus  02/19/2012  *RADIOLOGY REPORT*  Clinical Data: Dilated esophagus on modified barium swallow.  The patient reports no dysphagia or difficulty edema drinking.  ESOPHOGRAM/BARIUM SWALLOW  Technique:  Single contrast examination was performed using  thin barium.  Fluoroscopy time:  2.3 minutes.  Comparison:  None.  Findings:  Exam is somewhat limited due to patient immobility. Patient had a toe amputation 1 day prior.  The esophagus is patulous.  There is significant esophageal dysmotility with tertiary contractions and to-and-fro motion of the barium bolus.  There is some mild irregularity at the GE junction with mild narrowing.  A 13 mm barium tablet did pass GE junction easily.  IMPRESSION:  1.  Significant esophageal dysmotility with tertiary contractions and to-and-fro motion of the barium bolus.  Patulous esophagus.  2.  Some mild stricturing of the distal esophagus.  Cannot exclude a mucosal irregularity. 3.  Barium tablet passed through the GE junction easily.   Original Report Authenticated By: Genevive Bi, M.D.     Assessment/Plan: 1) MRSA bacteremia - repeat cultures remain negative to date.  TTE 1/16 without any significant vegetation.   -she will need a PICC but due to renal disease, picc is not recommended. ?other option for peripheral IV as outpatient -TEE negative for vegetation -osteomyelitis of second toe, now s/p resection -repeat blood cultures negative to date  -With negative work up including negative TEE, no hardware, she should complete a two week course of IV daptomycin 6 mg/kg (from 1st negative blood culture) through February 1st -Can start daptomycin when vancomycin level is below 15    Staci Righter, MD [facility] [phone] pager   02/21/2012, 2:17 PM

## 2012-02-21 NOTE — Progress Notes (Signed)
  Echocardiogram 2D Echocardiogram has been performed.  Georgian Co 02/21/2012, 12:22 PM

## 2012-02-22 ENCOUNTER — Encounter (HOSPITAL_COMMUNITY): Payer: Self-pay | Admitting: Cardiovascular Disease

## 2012-02-22 ENCOUNTER — Inpatient Hospital Stay (HOSPITAL_COMMUNITY): Payer: Medicare Other

## 2012-02-22 DIAGNOSIS — R4182 Altered mental status, unspecified: Secondary | ICD-10-CM

## 2012-02-22 LAB — RENAL FUNCTION PANEL
Albumin: 2.2 g/dL — ABNORMAL LOW (ref 3.5–5.2)
BUN: 58 mg/dL — ABNORMAL HIGH (ref 6–23)
Chloride: 99 mEq/L (ref 96–112)
GFR calc Af Amer: 34 mL/min — ABNORMAL LOW (ref >90)
GFR calc non Af Amer: 30 mL/min — ABNORMAL LOW (ref >90)
Phosphorus: 3.3 mg/dL (ref 2.3–4.6)
Potassium: 3.5 mEq/L (ref 3.5–5.1)
Sodium: 138 mEq/L (ref 135–145)

## 2012-02-22 LAB — IMMUNOFIXATION ELECTROPHORESIS
IgA: 271 mg/dL (ref 69–380)
IgG (Immunoglobin G), Serum: 2250 mg/dL — ABNORMAL HIGH (ref 690–1700)
IgM, Serum: 113 mg/dL (ref 52–322)
Total Protein ELP: 6.4 g/dL (ref 6.0–8.3)

## 2012-02-22 LAB — GLUCOSE, CAPILLARY
Glucose-Capillary: 239 mg/dL — ABNORMAL HIGH (ref 70–99)
Glucose-Capillary: 244 mg/dL — ABNORMAL HIGH (ref 70–99)
Glucose-Capillary: 266 mg/dL — ABNORMAL HIGH (ref 70–99)

## 2012-02-22 LAB — PROTEIN ELECTROPHORESIS, SERUM
Albumin ELP: 40.7 % — ABNORMAL LOW (ref 55.8–66.1)
Alpha-2-Globulin: 12.9 % — ABNORMAL HIGH (ref 7.1–11.8)
Beta 2: 6 % (ref 3.2–6.5)
Beta Globulin: 4.7 % (ref 4.7–7.2)
Total Protein ELP: 6.4 g/dL (ref 6.0–8.3)

## 2012-02-22 LAB — CULTURE, BLOOD (ROUTINE X 2): Culture: NO GROWTH

## 2012-02-22 LAB — CBC
MCV: 84.1 fL (ref 78.0–100.0)
Platelets: 391 10*3/uL (ref 150–400)
RBC: 3.77 MIL/uL — ABNORMAL LOW (ref 3.87–5.11)
RDW: 15.9 % — ABNORMAL HIGH (ref 11.5–15.5)
WBC: 15.8 10*3/uL — ABNORMAL HIGH (ref 4.0–10.5)

## 2012-02-22 LAB — VANCOMYCIN, RANDOM: Vancomycin Rm: 26.8 ug/mL

## 2012-02-22 MED ORDER — FUROSEMIDE 40 MG PO TABS
40.0000 mg | ORAL_TABLET | Freq: Two times a day (BID) | ORAL | Status: DC
  Administered 2012-02-22 – 2012-02-26 (×8): 40 mg via ORAL
  Filled 2012-02-22 (×10): qty 1

## 2012-02-22 MED FILL — Naloxone HCl Inj 0.4 MG/ML: INTRAMUSCULAR | Qty: 1 | Status: AC

## 2012-02-22 NOTE — Procedures (Signed)
Staff note I personally supervised Real time 2D ultrasound used for vein site selection, patency assessment, and needle entry.A record of image was made but could not be submitted for filing due to malfunction of printing device   Dr. Kalman Shan, M.D., [facility].C.P Pulmonary and Critical Care Medicine Staff Physician [facility] Pager: [phone], If no answer or between  15:00h - 7:00h: call [phone]  02/22/2012 6:27 PM

## 2012-02-22 NOTE — Progress Notes (Signed)
[facility] PROGRESS NOTE  [name] ZOX:[id number] DOB: [birth year] DOA: 02/14/2012 PCP: No primary provider on file.  Assessment/Plan: Sepsis  -secondary to MRSA Bacteremia  Left Basilar Opacity--?PNA  Given significant hypoxia , tachycardia and leukocytosis patient was admitted to step down monitoring. Patient started on empiric antibiotic IV vancomycin. Blood cultures sent on admission growing MRSA (1/16) on both sets.  -patient remains afebrile. Leukocytosis slowly improving.  -Flu negative  -Continue vancomycin  MRSA bacteremia  -TEE 02/21/12--patient had respiratory arrest and TEE was canceled--> repeat TTE performed and was negative for vegetation -no clear source except necrotic right 2nd toe which was amputated on 1/20. TTEshows no vegetations.  -vancomycin. ( currently held for supratherapeutic levels)  -.Appreciate ID recs.  -Has a necrotic right second toe. MRI of the foot showed osteomyelitis. Seen by orthopedic consult and had amputation of the right second toe done on 1/20 and tolerated procedure well.  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 Montez Morita  Acute on Chronic kidney disease  -nonoliguric  -Serum creatinine has improved with diuresis  - neg  5800cc with  intravenous furosemide -appreciate renal -furosemide 40mg  po bid, follow BMP -FeNa calculated was less than 1. Not improved with IV hydration. -Renal ultrasound unremarkable. Repeated proBNP was markedly elevated at 14,005.  -Chest x-ray (1/24/)  showed vascular congestion, but improving  -2D echo on admission showed mild diastolic dysfn.  -Appreciate nephrology, continue furosemide IV as outlined  -held vancomycin given elevated  levels.  -Vancomycin level random= 30.3 on 02/20/2012  -Avoid NSAIDs nephrotoxic agents.  Acute CHF--diastolic  -Ejection fraction 55-60%  -Clinically remains hypervolemic  -Continue furosemide as discussed  Acute respiratory failure  -resolved -Likely due to volume overload  -stable on room air Diabetes mellitus  Continue sliding scale insulin and Lantus.  -CBGs rising with better po intake Hypertension  Currently stable. Continue home medications-coreg  Dysphagia/esophageal dysmotility  Seen by speech and swallow and MBS done today which showed esophageal dysmotility.  -Patient informs having esophagial dilatation done in 2001 by Dr. Arlyce Dice.  -Appreciate GI recommendations  On dys level 1 diet--speech continues to follow for safest diet  -02/22/12-diet upgraded to dysphagia 3 IV access--PICC not good option due to vein sclerosis for future HD  -triple lumen  Family Communication: none at bedside . Patient's legal guardian [name] was called and updated plan on 1/21  Disposition Plan: back to SNF once stable Consultants:  [facility]  ID  Orthopedics  [facility] Procedures:  Modified barium swallow ( 1/18)  Right second toe amputation on 1/20  Standard barium swallow ordered  Triple lumen placed 02/22/12      Procedures/Studies: Dg Chest 2 View  02/19/2012  *RADIOLOGY REPORT*  Clinical Data: Weakness.  Evaluate for pulmonary edema  CHEST - 2 VIEW  Comparison: None 02/17/2012  Findings: This examination is technically limited by body habitus and patient positioning (semiupright) Cardiomegaly is stable. There is bilateral pulmonary vascular congestion.  There is a patchy left retrocardiac opacity.  Hazy opacity the right lung base may be due to overlying soft tissues of the breast. On the lateral view, the posterior costophrenic angles are obscured by the table, as the patient was in semiupright position.  IMPRESSION: Cardiomegaly and mild pulmonary vascular congestion.  Image detail  limited by patient body habitus and positioning. Small left basilar opacity could be due to atelectasis or airspace disease. Small bilateral pleural effusions cannot be excluded, as described above.   Original Report Authenticated By: Britta Mccreedy, M.D.    Dg Chest 2 View  02/17/2012  *RADIOLOGY REPORT*  Clinical Data: Increased cough and shortness of breath.  Question fluid overload.  CHEST - 2 VIEW  Comparison: Chest radiograph 02/15/2012.  Findings: Stable cardiomegaly.  There is diffuse pulmonary vascular congestion with interstitial prominence in the perihilar regions of lung bases.  Patchy airspace opacities in the left mid lung.  Small bilateral pleural effusions.  Thickening of the fissures on the lateral view.  IMPRESSION: Mild congestive heart failure pattern with small bilateral pleural effusions.   Original Report Authenticated By: Britta Mccreedy, M.D.    Ct Head Wo Contrast  02/14/2012  *RADIOLOGY REPORT*  Clinical Data: Altered mental status.  History of weakness and TIA.  CT HEAD WITHOUT CONTRAST  Technique:  Contiguous axial images were obtained from the base of the skull through the vertex without contrast.  Comparison: 12/25/2008  Findings: Bone windows demonstrate similar abnormal appearance of the right globe, presumably related to prior trauma or hemorrhage. There are also similar calcifications about the left globe.  Mucosal thickening of the sphenoid sinus and ethmoid air cells.  A left mastoid effusion is again identified.  Soft tissue windows demonstrate similar moderate hydrocephalus, including the fourth, third, and lateral ventricles.  Slight increase in periventricular white matter hypoattenuation.  Redemonstration of right frontal hypoattenuation which is felt to be similar, including on image 20/series 2.  No mass lesion, hemorrhage, intra-axial, or extra-axial fluid collection.  IMPRESSION:  1.  Similar hydrocephalus. 2.  Slight increase in periventricular white  matter hypoattenuation.  Favor small vessel ischemic change.  A component of transependymal CSF resorption could look similar. 3.  Right frontal lobe remote infarct. 4.  Sinus disease with a left mastoid effusion.   Original Report Authenticated By: Jeronimo Greaves, M.D.    Dg Esophagus  02/19/2012  *RADIOLOGY REPORT*  Clinical Data: Dilated esophagus on modified barium swallow.  The patient reports no dysphagia or difficulty edema drinking.  ESOPHOGRAM/BARIUM SWALLOW  Technique:  Single contrast examination was performed using thin barium.  Fluoroscopy time:  2.3 minutes.  Comparison:  None.  Findings:  Exam is somewhat limited due to patient immobility. Patient had a toe amputation 1 day prior.  The esophagus is patulous.  There is significant esophageal dysmotility with tertiary contractions and to-and-fro motion of the barium bolus.  There is some mild irregularity at the GE junction with mild narrowing.  A 13 mm barium tablet did pass GE junction easily.  IMPRESSION:  1.  Significant esophageal dysmotility with tertiary contractions and to-and-fro motion of the barium bolus.  Patulous esophagus.  2.  Some mild stricturing of the distal esophagus.  Cannot exclude a mucosal irregularity. 3.  Barium tablet passed through the GE junction easily.   Original Report Authenticated By: Genevive Bi, M.D.    US Renal  02/17/2012  *RADIOLOGY REPORT*  Clinical Data: Acute renal insufficiency with decreased urine output.  RENAL/URINARY TRACT ULTRASOUND COMPLETE  Comparison:  CT 06/26/2011  Findings:  Right Kidney:  12.9 cm. No hydronephrosis.  Small renal calculi identified. Normal renal cortical thickness and echogenicity.  Left Kidney:  11.9 cm. No hydronephrosis.  Small renal calculi identified. Normal renal cortical thickness and echogenicity.  Bladder:  Collapsed around a Foley catheter.  IMPRESSION: No hydronephrosis.  Renal collecting system calculi, as before.   Original Report Authenticated By: Jeronimo Greaves,  M.D.    Mr Foot Right Wo Contrast  02/17/2012  *RADIOLOGY REPORT*  Clinical Data: Necrotic second toe.  MRI OF THE RIGHT FOREFOOT WITHOUT CONTRAST  Technique:  Multiplanar, multisequence MR imaging was performed. No intravenous contrast was administered.  Comparison: Radiographs 02/17/2012.  Findings: Abnormal signal intensity in the distal phalanx of the second toe suspicious for osteomyelitis.  No findings to suggest septic arthritis.  There is mild diffuse cellulitis and diffuse myofasciitis.  No focal soft tissue abscess.  IMPRESSION:  1.  Osteomyelitis involving the distal phalanx of the second toe. 2.  Cellulitis and myofasciitis without focal soft tissue abscess.   Original Report Authenticated By: Rudie Meyer, M.D.    Dg Chest Port 1 View  02/22/2012  *RADIOLOGY REPORT*  Clinical Data: Line placement.  PORTABLE CHEST - 1 VIEW  Comparison: 02/19/2012  Findings: Left central line has been placed with the tip in the upper right atrium.  No pneumothorax.  Cardiomegaly with vascular congestion and diffuse bilateral opacities, likely mild edema.  No effusions.  IMPRESSION: Left central line tip in the upright atrium.  No pneumothorax.  Mild bilateral airspace opacities, presumably mild edema.   Original Report Authenticated By: Charlett Nose, M.D.    Portable Chest 1 View  02/15/2012  *RADIOLOGY REPORT*  Clinical Data: CHF.  PORTABLE CHEST - 1 VIEW  Comparison: 02/14/2012  Findings: Shallow inspiration.  Cardiac enlargement with pulmonary vascular congestion and perihilar infiltration suggesting edema or pneumonia.  The infiltration is slightly more prominent than previous study.  No pneumothorax or effusion.  IMPRESSION: Cardiac enlargement with pulmonary vascular congestion.  Increasing perihilar infiltrates or edema bilaterally.   Original Report Authenticated By: Burman Nieves, M.D.    Dg Chest Portable 1 View  02/14/2012  *RADIOLOGY REPORT*  Clinical Data: Shortness of breath and diabetes.   Hypertension.  PORTABLE CHEST - 1 VIEW  Comparison: Two-view chest x-ray and CT of the chest 06/22/2011.  Findings: The heart is mildly enlarged.  Lung volumes are low. Mild interstitial and airspace disease is present bilaterally. The bone windows are unremarkable.  IMPRESSION:  1.  Mild cardiomegaly and edema is concerning for congestive heart failure. 2.  There is some airspace disease bilaterally which raises the possibility of infection as well.   Original Report Authenticated By: Marin Roberts, M.D.    Dg Foot 2 Views Right  02/17/2012  *RADIOLOGY REPORT*  Clinical Data: Necrotic second toe right foot.  History of diabetes.  Staph bacteremia.  RIGHT FOOT - 2 VIEW  Comparison: None.  Findings: Underlying renal osteodystrophy with vascular calcifications.  Only AP and lateral radiographs performed; no oblique imaging.  Overlap of the digits on the lateral view. Question soft tissue ulcer about the lateral aspect of the second toe distally on AP view.  No gross osseous destruction.  No callus deposition.  IMPRESSION: Limited 2 view study, demonstrating no gross osseous destruction. Possible soft tissue defect about the second toe laterally.  If ongoing clinical concern, consider contrast enhanced MRI.   Original Report Authenticated By: Jeronimo Greaves, M.D.    Dg Swallowing Func-speech Pathology  02/16/2012  Chales Abrahams, CCC-SLP     02/16/2012 10:47 AM Objective Swallowing Evaluation: Modified Barium Swallowing Study   Patient Details  Name: Deborahann Poteat MRN: [id number] Date of Birth: January 07, 1941  Today's Date: 02/16/2012 Time: 0950-1015 SLP Time Calculation (min): 25 min  Past Medical History:  Past Medical History  Diagnosis Date  . Hypertension   . Diabetes mellitus   . Blindness   . TIA (transient ischemic attack)   . Anemia   . GERD (gastroesophageal reflux disease)   . Renal disorder   . Constipation    Past Surgical History:  Past Surgical History  Procedure Date  . Gallbladder surgery   .  Esophageal dilation   . Cholecystectomy    HPI:  72 yo female adm to [facility] from [facility] with  hypoxia, CXR + fluid, +/- pna- diagnosed with HCAP, ? CHF (await  BNP).  Per MD notes, pt told MD that she "always has pna."   PMH  + for TIA, GERD, anemia, constipation, HTN, blindness/HOH. SLP  phoned SNF SLP who reports she has not seen pt, she has however  seen the pt's roommate during meals and has not noted pt coughing  during meals.  Per SNF SLP, pt was recently "flagged for weight  loss" .  Esophageal dilatation completed in 03/1999 and pt states  she had problems swallowing before surgery that resolved after.    BSE completed Thursday pm and follow up Friday with  recommendation for MBS due to inability to rule out  dysphagia/aspiration at bedside with pt overtly coughing.     Assessment / Plan / Recommendation Clinical Impression  Dysphagia Diagnosis: Suspected primary esophageal dysphagia;Mild  oral phase dysphagia;Mild pharyngeal phase dysphagia   Clinical impression: Pt presents with mild oropharyngeal and  suspected primary esophageal dysphagia.  Decreased oral control  results in premature spillage of liquids into pharynx and  piecemeal deglutition with solids/pudding as well as oral stasis  with poor awareness.  Cued swallow facilitated timeliness of  swallow.  Pharyngeal dysphagia with delayed swallow initiation to  pyriform sinus with liquids, to vallecular space with  solid/pudding and  resultant trace silent penetration of liquids.   Cued throat clear removed trace penetrates but pt requiried  verbal cues x4 to complete and became agitated.  Pt prematurely  spills pudding/cracker filling entire vallecular space for longer  than 30 seconds as she continued to orally manipulate remainder  of bolus before she actually triggers a swallow.  No aspiration  or penetration with pudding/cracker noted.  Pt did not arrive to  test with her dentures unfortunately, therefore cracker was  softened.        Suspect esophageal deficits significantly contribute to  oropharyngeal deficits.  Appearance of prominent cricopharyngeus  with retention of secretions x1, CP did not impact barium flow.   However pt did appear with stasis from distal to mid esophagus  with distal narrowing.  Above area of narrowed region, esophagus  appeared dilated (? Motility issues).  Retrograde propulsion of  barium occurred WITHOUT pt awareness.   Suspect pt's primary  dysphagia is esophageal.    Pt was instructed to compensatory strategies, testing findings  and recommendations during the procedure. Pt may benefit from GI  referral for esophageal dysphagia management (h/o esophageal  dilatation in 2001).  In the interim time, rec liquid diet in  small amounts for maximal airway protection and medications be  crushed given with liquids if not contraindicated.  SLP to  follow.  Thanks.       Treatment Recommendation  Therapy as outlined in treatment plan below    Diet Recommendation Thin liquid (All Liquids)   Liquid Administration via: Cup;Straw Medication Administration: Other (Comment) (crush with liquids) Supervision: Full supervision/cueing for compensatory strategies Compensations: Small sips/bites;Slow rate;Clear throat  intermittently Postural  Changes and/or Swallow Maneuvers: Seated upright 90  degrees;Upright 30-60 min after meal    Other  Recommendations Recommended Consults: Consider GI  evaluation Oral Care Recommendations: Oral care BID   Follow Up Recommendations       Frequency and Duration min 2x/week  1 week   Pertinent Vitals/Pain     SLP Swallow Goals Patient will utilize recommended strategies during swallow to  increase swallowing safety with: Total assistance   General HPI: 72 yo female adm to [facility] from [facility]  SNF with hypoxia, CXR + fluid, +/- pna- diagnosed with HCAP, ?  CHF (await BNP).  Per MD notes, pt told MD that she "always has  pna."   PMH + for TIA, GERD, anemia, constipation, HTN,   blindness/HOH. SLP phoned SNF SLP who reports she has not seen  pt, she has however seen the pt's roommate during meals and has  not noted pt coughing during meals.  Per SNF SLP, pt was recently  "flagged for weight loss" .  Esophageal dilatation completed in  03/1999 and pt states she had problems swallowing before surgery  that resolved after.   BSE completed Thursday pm and follow up  Friday with recommendation for MBS due to inability to rule out  dysphagia/aspiration at bedside with pt overtly coughing. Type of Study: Modified Barium Swallowing Study Reason for Referral: Objectively evaluate swallowing function Diet Prior to this Study: Nectar-thick liquids;Regular Temperature Spikes Noted: No Respiratory Status: Supplemental O2 delivered via (comment) Behavior/Cognition: Alert;Cooperative;Requires cueing;Decreased  sustained attention;Agitated (easily agitated when asked  questions) Oral Cavity - Dentition: Dentures, not available (dentures left  in pt's room) Oral Motor / Sensory Function: Impaired - see Bedside swallow  eval Self-Feeding Abilities: Needs assist Patient Positioning: Upright in chair Baseline Vocal Quality: Clear Volitional Swallow: Unable to elicit (pt states she didn't have  anything to swallow) Anatomy: Within functional limits Pharyngeal Secretions: Not observed secondary MBS    Reason for Referral Objectively evaluate swallowing function   Oral Phase Oral Preparation/Oral Phase Oral Phase: Impaired Oral - Nectar Oral - Nectar Teaspoon:  (decr oral control results in premature  spillage ) Oral - Nectar Cup:  (decr oral control results in premature  spillage) Oral - Thin Oral - Thin Teaspoon:  (decr oral control results in premature  spillage) Oral - Thin Cup:  (decr oral control results in premature  spillage) Oral - Thin Straw:  (decr oral control results in premature  spillage) Oral - Solids Oral - Puree: Weak lingual manipulation;Delayed oral  transit;Piecemeal swallowing;Lingual/palatal  residue Oral - Regular: Piecemeal swallowing;Weak lingual  manipulation;Delayed oral transit;Impaired  mastication;Lingual/palatal residue Oral Phase - Comment Oral Phase - Comment: decr oral control results in premature  spillage of all liquids, pt did not have dentures present for  exam- cracker was softened with thin barium and pudding   Pharyngeal Phase Pharyngeal Phase Pharyngeal Phase: Impaired Pharyngeal - Nectar Pharyngeal - Nectar Teaspoon: Premature spillage to valleculae Pharyngeal - Nectar Cup: Delayed swallow initiation;Premature  spillage to pyriform sinuses Pharyngeal - Thin Pharyngeal - Thin Teaspoon: Premature spillage to pyriform  sinuses;Penetration/Aspiration during swallow Penetration/Aspiration details (thin teaspoon): Material enters  airway, remains ABOVE vocal cords then ejected out Pharyngeal - Thin Cup: Premature spillage to pyriform  sinuses;Penetration/Aspiration during swallow Penetration/Aspiration details (thin cup): Material enters  airway, CONTACTS cords and not ejected out (cued throat clear  removed trace amount of penetration) Pharyngeal - Thin Straw: Premature spillage to pyriform sinuses Pharyngeal - Solids Pharyngeal - Puree: Delayed  swallow initiation;Premature spillage  to pyriform sinuses (retention of barium at vallecular space  before swlalow) Pharyngeal - Regular: Premature spillage to valleculae;Delayed  swallow initiation (retention of cracker at vallecular space  before swallow)  Cervical Esophageal Phase    GO    Cervical Esophageal Phase Cervical Esophageal Phase: Impaired Cervical Esophageal Phase - Comment Cervical Esophageal Comment: appearance of prominent  cricopharyngeus with retention of secretions x1, did not impact  barium flow, appearance of stasis with distal narrowing and  appearance of dilated esophagus above narrowed region, retrograde  propulsion of barium appeared WITHOUT pt awareness          Donavan Burnet, MS [facility] SLP [phone]            Subjective: Patient is eating better since the diet was advanced to dysphagia 3. Patient denies fevers, chills, chest pain, shortness breath, nausea, vomiting, diarrhea, abdominal pain, rashes, dysuria.  Objective: Filed Vitals:   02/22/12 0612 02/22/12 0933 02/22/12 1244 02/22/12 1340  BP: 150/51   123/64  Pulse: 68   66  Temp: 97.9 F (36.6 C)   98.5 F (36.9 C)  TempSrc: Oral   Oral  Resp: 16  18 17   Height:      Weight:      SpO2: 100% 99% 98% 96%    Intake/Output Summary (Last 24 hours) at 02/22/12 1625 Last data filed at 02/22/12 1328  Gross per 24 hour  Intake    600 ml  Output   4900 ml  Net  -4300 ml   Weight change:  Exam:   General:  Pt is alert, follows commands appropriately, not in acute distress  HEENT: No icterus, No thrush, [place]/AT  Cardiovascular: RRR, S1/S2, no rubs, no gallops  Respiratory:    bibasilar crackles. No wheezes or rhonchi. Good air movement.  Abdomen: Soft/+BS, non tender, non distended, no guarding  Extremities: Right foot is wrapped. Trace edema left lower extremity without cellulitis or rashes  Data Reviewed: Basic Metabolic Panel:  Lab 02/22/12 4540 02/21/12 0609 02/20/12 0500 02/19/12 0830 02/18/12 0849  NA 138 135 132* 133* 131*  K 3.5 3.7 3.7 3.9 3.6  CL 99 99 95* 96 93*  CO2 27 24 21 20 20   GLUCOSE 192* 194* 160* 109* 116*  BUN 58* 71* 76* 79* 79*  CREATININE 1.70* 2.28* 2.82* 2.68* 2.57*  CALCIUM 8.9 8.8 8.9 8.9 9.0  MG -- -- -- -- --  PHOS 3.3 4.1 5.0* -- --   Liver Function Tests:  Lab 02/22/12 0540 02/21/12 0609 02/20/12 0500  AST -- -- --  ALT -- -- --  ALKPHOS -- -- --  BILITOT -- -- --  PROT -- -- --  ALBUMIN 2.2* 2.0* 2.0*   No results found for this basename: LIPASE:5,AMYLASE:5 in the last 168 hours No results found for this basename: AMMONIA:5 in the last 168 hours CBC:  Lab 02/22/12 0540 02/20/12 0710 02/16/12 0629  WBC 15.8* 14.0* 19.9*  NEUTROABS -- 10.5* --  HGB 10.4* 9.1*  10.4*  HCT 31.7* 28.1* 32.0*  MCV 84.1 84.6 83.8  PLT 391 379 343   Cardiac Enzymes:  Lab 02/20/12 0710  CKTOTAL 8  CKMB --  CKMBINDEX --  TROPONINI --   BNP: No components found with this basename: POCBNP:5 CBG:  Lab 02/22/12 1246 02/22/12 0748 02/21/12 2200 02/21/12 1750 02/21/12 1222  GLUCAP 244* 185* 239* 185* 185*    Recent Results (from the past 240 hour(s))  CULTURE, BLOOD (ROUTINE X 2)  Status: Normal   Collection Time   02/14/12 10:35 AM      Component Value Range Status Comment   Specimen Description BLOOD RIGHT HAND   Final    Special Requests BOTTLES DRAWN AEROBIC ONLY   Final    Culture  Setup Time 02/14/2012 14:49   Final    Culture     Final    Value: METHICILLIN RESISTANT STAPHYLOCOCCUS AUREUS     Note: RIFAMPIN AND GENTAMICIN SHOULD NOT BE USED AS SINGLE DRUGS FOR TREATMENT OF STAPH INFECTIONS. CRITICAL RESULT CALLED TO, READ BACK BY AND VERIFIED WITH: BETHANY STRINGER 02/16/12 @ 8:22PM BY RUSCA.     Note: Gram Stain Report Called to,Read Back By and Verified With: JESSICA CONRAD 02/15/12 1440 BY SMITHERSJ   Report Status 02/17/2012 FINAL   Final    Organism ID, Bacteria METHICILLIN RESISTANT STAPHYLOCOCCUS AUREUS   Final   CULTURE, BLOOD (ROUTINE X 2)     Status: Normal   Collection Time   02/14/12 10:45 AM      Component Value Range Status Comment   Specimen Description BLOOD RIGHT FOREARM   Final    Special Requests BOTTLES DRAWN AEROBIC ONLY   Final    Culture  Setup Time 02/14/2012 14:49   Final    Culture     Final    Value: METHICILLIN RESISTANT STAPHYLOCOCCUS AUREUS     Note: SUSCEPTIBILITIES PERFORMED ON PREVIOUS CULTURE WITHIN THE LAST 5 DAYS. CRITICAL RESULT CALLED TO, READ BACK BY AND VERIFIED WITH: BETHANY STRINGER 02/16/12 @ 8:22PM BY RUSCA.     Note: CRITICAL RESULT CALLED TO, READ BACK BY AND VERIFIED WITH: MILLIE PRICE 02/16/12 @ 10:55AM BY RUSCA.   Report Status 02/17/2012 FINAL   Final   URINE CULTURE     Status: Normal    Collection Time   02/14/12 10:46 AM      Component Value Range Status Comment   Specimen Description URINE, CATHETERIZED   Final    Special Requests NONE   Final    Culture  Setup Time 02/14/2012 18:58   Final    Colony Count NO GROWTH   Final    Culture NO GROWTH   Final    Report Status 02/15/2012 FINAL   Final   MRSA PCR SCREENING     Status: Normal   Collection Time   02/14/12  3:08 PM      Component Value Range Status Comment   MRSA by PCR NEGATIVE  NEGATIVE Final   CULTURE, BLOOD (ROUTINE X 2)     Status: Normal   Collection Time   02/16/12  4:30 PM      Component Value Range Status Comment   Specimen Description BLOOD RIGHT HAND  3 ML IN [facility] BOTTLE   Final    Special Requests NONE   Final    Culture  Setup Time 02/16/2012 23:17   Final    Culture NO GROWTH 5 DAYS   Final    Report Status 02/22/2012 FINAL   Final   CULTURE, BLOOD (ROUTINE X 2)     Status: Normal   Collection Time   02/16/12  4:48 PM      Component Value Range Status Comment   Specimen Description BLOOD RIGHT ARM  2 ML IN [facility] BOTTLE   Final    Special Requests NONE   Final    Culture  Setup Time 02/16/2012 23:16   Final    Culture NO GROWTH 5 DAYS  Final    Report Status 02/22/2012 FINAL   Final   SURGICAL PCR SCREEN     Status: Normal   Collection Time   02/18/12  5:25 AM      Component Value Range Status Comment   MRSA, PCR NEGATIVE  NEGATIVE Final    Staphylococcus aureus NEGATIVE  NEGATIVE Final   URINE CULTURE     Status: Normal   Collection Time   02/19/12 10:40 AM      Component Value Range Status Comment   Specimen Description URINE, CATHETERIZED   Final    Special Requests NONE   Final    Culture  Setup Time 02/20/2012 01:28   Final    Colony Count NO GROWTH   Final    Culture NO GROWTH   Final    Report Status 02/20/2012 FINAL   Final      Scheduled Meds:   . albuterol  2.5 mg Nebulization BID  . antiseptic oral rinse  15 mL Mouth Rinse q12n4p  . aspirin EC  325 mg Oral Daily  .  brimonidine  1 drop Left Eye BID  . brinzolamide  1 drop Left Eye BID  . carvedilol  3.125 mg Oral Q breakfast  . chlorhexidine  15 mL Mouth/Throat BID  . docusate sodium  100 mg Oral BID  . furosemide  40 mg Oral BID  . gabapentin  100 mg Oral QHS  . insulin aspart  0-15 Units Subcutaneous TID WC  . insulin aspart  0-5 Units Subcutaneous QHS  . insulin glargine  10 Units Subcutaneous QHS  . ipratropium  0.5 mg Nebulization BID  . metoCLOPramide  5 mg Oral TID WC  . multivitamin with minerals  1 tablet Oral Daily  . sodium chloride  3 mL Intravenous Q12H  . sodium chloride  3 mL Intravenous Q12H   Continuous Infusions:    Martha Ellerby, DO  [facility] Pager [phone]  If 7PM-7AM, please contact night-coverage [email or web address] Password [email or web address] 02/22/2012, 4:25 PM   LOS: 8 days

## 2012-02-22 NOTE — Progress Notes (Signed)
Clinical Social Worker continuing to follow for discharge planning. Pt was admitted from [facility] and plan is for pt to return to facility when medically stable for discharge. Clinical Social Worker spoke with MD who stated there is a potential for discharge during weekend and Clinical Social Worker confirmed [facility] can accept pt back during the weekend.   Clinical Social Worker submitted for [employer] authorization for IV antibiotics at facility and confirmed authorization received. Weekend Clinical Social Worker to assist with pt discharge if pt medically stable for discharge during the weekend.  Jacklynn Lewis, MSW, LCSWA  Clinical Social Work [phone]

## 2012-02-22 NOTE — Progress Notes (Addendum)
[facility]  Date of Admission:  02/14/2012  Antibiotics: Vancomycin 8 days  vanco trough 1/22 30.3  Subjective: TEE NOT DONE  Objective: Temp:  [97.3 F (36.3 C)-97.9 F (36.6 C)] 97.9 F (36.6 C) (01/24 0612) Pulse Rate:  [68-70] 68  (01/24 0612) Resp:  [16-18] 16  (01/24 0612) BP: (138-150)/(51-76) 150/51 mmHg (01/24 0612) SpO2:  [98 %-100 %] 99 % (01/24 0933)  General: NAD CV: RRR without m/r/g Lungs: CTA B   Lab Results Lab Results  Component Value Date   WBC 15.8* 02/22/2012   HGB 10.4* 02/22/2012   HCT 31.7* 02/22/2012   MCV 84.1 02/22/2012   PLT 391 02/22/2012    Lab Results  Component Value Date   CREATININE 1.70* 02/22/2012   BUN 58* 02/22/2012   NA 138 02/22/2012   K 3.5 02/22/2012   CL 99 02/22/2012   CO2 27 02/22/2012    Lab Results  Component Value Date   ALT 24 02/14/2012   AST 40* 02/14/2012   ALKPHOS 174* 02/14/2012   BILITOT 0.8 02/14/2012      Microbiology: Recent Results (from the past 240 hour(s))  CULTURE, BLOOD (ROUTINE X 2)     Status: Normal   Collection Time   02/14/12 10:35 AM      Component Value Range Status Comment   Specimen Description BLOOD RIGHT HAND   Final    Special Requests BOTTLES DRAWN AEROBIC ONLY   Final    Culture  Setup Time 02/14/2012 14:49   Final    Culture     Final    Value: METHICILLIN RESISTANT STAPHYLOCOCCUS AUREUS     Note: RIFAMPIN AND GENTAMICIN SHOULD NOT BE USED AS SINGLE DRUGS FOR TREATMENT OF STAPH INFECTIONS. CRITICAL RESULT CALLED TO, READ BACK BY AND VERIFIED WITH: BETHANY STRINGER 02/16/12 @ 8:22PM BY RUSCA.     Note: Gram Stain Report Called to,Read Back By and Verified With: JESSICA CONRAD 02/15/12 1440 BY SMITHERSJ   Report Status 02/17/2012 FINAL   Final    Organism ID, Bacteria METHICILLIN RESISTANT STAPHYLOCOCCUS AUREUS   Final   CULTURE, BLOOD (ROUTINE X 2)     Status: Normal   Collection Time   02/14/12 10:45 AM      Component Value Range Status Comment   Specimen Description BLOOD RIGHT FOREARM   Final    Special Requests BOTTLES DRAWN AEROBIC ONLY   Final    Culture  Setup Time 02/14/2012 14:49   Final    Culture     Final    Value: METHICILLIN RESISTANT STAPHYLOCOCCUS AUREUS     Note: SUSCEPTIBILITIES PERFORMED ON PREVIOUS CULTURE WITHIN THE LAST 5 DAYS. CRITICAL RESULT CALLED TO, READ BACK BY AND VERIFIED WITH: BETHANY STRINGER 02/16/12 @ 8:22PM BY RUSCA.     Note: CRITICAL RESULT CALLED TO, READ BACK BY AND VERIFIED WITH: MILLIE PRICE 02/16/12 @ 10:55AM BY RUSCA.   Report Status 02/17/2012 FINAL   Final   URINE CULTURE     Status: Normal   Collection Time   02/14/12 10:46 AM      Component Value Range Status Comment   Specimen Description URINE, CATHETERIZED   Final    Special Requests NONE   Final    Culture  Setup Time 02/14/2012 18:58   Final    Colony Count NO GROWTH   Final    Culture NO GROWTH   Final    Report Status 02/15/2012 FINAL   Final  MRSA PCR SCREENING     Status: Normal   Collection Time   02/14/12  3:08 PM      Component Value Range Status Comment   MRSA by PCR NEGATIVE  NEGATIVE Final   CULTURE, BLOOD (ROUTINE X 2)     Status: Normal   Collection Time   02/16/12  4:30 PM      Component Value Range Status Comment   Specimen Description BLOOD RIGHT HAND  3 ML IN [facility] BOTTLE   Final    Special Requests NONE   Final    Culture  Setup Time 02/16/2012 23:17   Final    Culture NO GROWTH 5 DAYS   Final    Report Status 02/22/2012 FINAL   Final   CULTURE, BLOOD (ROUTINE X 2)     Status: Normal   Collection Time   02/16/12  4:48 PM      Component Value Range Status Comment   Specimen Description BLOOD RIGHT ARM  2 ML IN [facility] BOTTLE   Final    Special Requests NONE   Final    Culture  Setup Time 02/16/2012 23:16   Final    Culture NO GROWTH 5 DAYS   Final    Report Status 02/22/2012 FINAL   Final   SURGICAL PCR SCREEN     Status: Normal   Collection Time   02/18/12  5:25 AM      Component Value Range Status  Comment   MRSA, PCR NEGATIVE  NEGATIVE Final    Staphylococcus aureus NEGATIVE  NEGATIVE Final   URINE CULTURE     Status: Normal   Collection Time   02/19/12 10:40 AM      Component Value Range Status Comment   Specimen Description URINE, CATHETERIZED   Final    Special Requests NONE   Final    Culture  Setup Time 02/20/2012 01:28   Final    Colony Count NO GROWTH   Final    Culture NO GROWTH   Final    Report Status 02/20/2012 FINAL   Final     Studies/Results: No results found.  Assessment/Plan: 1) MRSA bacteremia - repeat cultures remain negative to date.  TTE 1/16 without any significant vegetation.   -she will need a PICC but due to renal disease, picc is not recommended. ?other option for peripheral IV as outpatient  -CK level wnl at baseline -Can start daptomycin when vancomycin level is below 15  ADDENDUM: After discussion with Dr. Sharyn Lull, I missread the report under findings as TEE done when it was actually a repeat TTE due to the events as noted.  TEE not able to be done.  She therefore should complete a 4 week course of daptomycin 6 mg/kg through Feb 15th.    Dr. Orvan Falconer is on over the weekend if needed    Staci Righter, MD [facility] [phone] pager   02/22/2012, 12:44 PM

## 2012-02-22 NOTE — Procedures (Signed)
Central Venous Catheter Insertion Procedure Note Macey Wurtz [id number] December 08, 1940  Procedure: Insertion of Central Venous Catheter Indications: Assessment of intravascular volume, Drug and/or fluid administration and Frequent blood sampling  Procedure Details Consent: Risks of procedure as well as the alternatives and risks of each were explained to the (patient/caregiver).  Consent for procedure obtained. Time Out: Verified patient identification, verified procedure, site/side was marked, verified correct patient position, special equipment/implants available, medications/allergies/relevent history reviewed, required imaging and test results available.  Performed Real time [place] used to ID and cannulate the left internal jugular vein.  Maximum sterile technique was used including antiseptics, cap, gloves, gown, hand hygiene, mask and sheet. Skin prep: Chlorhexidine; local anesthetic administered A antimicrobial bonded/coated triple lumen catheter was placed in the left internal jugular vein using the Seldinger technique.  Evaluation Blood flow good Complications: No apparent complications Patient did tolerate procedure well. Chest X-ray ordered to verify placement.  CXR: pending.  BABCOCK,PETE 02/22/2012, 1:17 PM

## 2012-02-22 NOTE — Progress Notes (Signed)
Subjective:   No complaints Diuresis has picked up considerably Negative 4.4 liters/24 hours  Getting central line for antibiotics (Vanco on hold all week d/t high levels - still 26.8 this AM; to get cubicin once vanco under 15 to finish course)  Objective:     Vital signs in last 24 hours: Filed Vitals:   02/21/12 2037 02/21/12 2204 02/22/12 0612 02/22/12 0933  BP:  138/76 150/51   Pulse:  70 68   Temp:  97.3 F (36.3 C) 97.9 F (36.6 C)   TempSrc:  Oral Oral   Resp:  16 16   Height:      Weight:      SpO2: 98% 100% 100% 99%   Weight change:   Intake/Output Summary (Last 24 hours) at 02/22/12 1155 Last data filed at 02/22/12 1106  Gross per 24 hour  Intake    480 ml  Output   4900 ml  Net  -4420 ml    Physical Exam:  Blood pressure 150/51, pulse 68, temperature 97.9 F (36.6 C), temperature source Oral, resp. rate 16, height 5\' 2"  (1.575 m), weight 97.8 kg (215 lb 9.8 oz), SpO2 99.00%.  No weight for 3 days 02/19/12 0548 97.8 kg (215 lb 9.8 oz)  02/17/12 0604 92.2 kg (203 lb 4.2 oz)  02/16/12 1800 90.8 kg (200 lb 2.8 oz)  02/16/12 0300 90.1 kg (198 lb 10.2 oz)  02/15/12 0400 89.6 kg (197 lb 8.5 oz) 02/14/12 1500 88.6 kg (195 lb 5.2 oz)   Elderly BF NAD Less puffy Comfortable lying flat Lungs with crackles still left more than right but ant clear Abdomen soft NT Trace edema only Dressing intact right foot  Labs:   Lab 02/22/12 0540 02/21/12 0609 02/20/12 0500 02/19/12 0830 02/18/12 0849 02/16/12 0658  NA 138 135 132* 133* 131* 131*  K 3.5 3.7 3.7 3.9 3.6 4.0  CL 99 99 95* 96 93* 94*  CO2 27 24 21 20 20 23   GLUCOSE 192* 194* 160* 109* 116* 203*  BUN 58* 71* 76* 79* 79* 74*  CREATININE 1.70* 2.28* 2.82* 2.68* 2.57* 1.73*  ALB -- -- -- -- -- --  CALCIUM 8.9 8.8 8.9 8.9 9.0 8.8  PHOS 3.3 4.1 5.0* -- -- --     Lab 02/22/12 0540 02/21/12 0609 02/20/12 0500  AST -- -- --  ALT -- -- --  ALKPHOS -- -- --  BILITOT -- -- --  PROT -- -- --  ALBUMIN  2.2* 2.0* 2.0*     Lab 02/22/12 0540 02/20/12 0710 02/16/12 0629  WBC 15.8* 14.0* 19.9*  NEUTROABS -- 10.5* --  HGB 10.4* 9.1* 10.4*  HCT 31.7* 28.1* 32.0*  MCV 84.1 84.6 83.8  PLT 391 379 343     Lab 02/20/12 0710  CKTOTAL 8  CKMB --  CKMBINDEX --  TROPONINI --     Lab 02/22/12 0748 02/21/12 2200 02/21/12 1750 02/21/12 1222 02/21/12 0728  GLUCAP 185* 239* 185* 185* 171*       Lab 02/20/12 0710  IRON 58  TIBC 176*  TRANSFERRIN --  FERRITIN 103   Results for DOLLENE, MALLERY (MRN [id number]) as of 02/21/2012 18:57  Ref. Range 02/20/2012 07:10  ANA Latest Range: NEGATIVE  NEGATIVE  Results for CHARISE, LEINBACH (MRN [id number]) as of 02/21/2012 18:57  Ref. Range 02/20/2012 07:10  C3 Complement Latest Range: 90-180 mg/dL 132  Complement C4, Body Fluid Latest Range: 10-40 mg/dL 31  Results for ELEXA, KIVI (MRN [id number]) as of 02/22/2012 11:56  Ref. Range 02/19/2012 08:30 02/20/2012 07:10 02/22/2012 05:40  Vancomycin Rm No range found 32.8 30.3 26.8   Studies/Results: No results found.       Marland Kitchen albuterol  2.5 mg Nebulization BID  . antiseptic oral rinse  15 mL Mouth Rinse q12n4p  . aspirin EC  325 mg Oral Daily  . brimonidine  1 drop Left Eye BID  . brinzolamide  1 drop Left Eye BID  . carvedilol  3.125 mg Oral Q breakfast  . chlorhexidine  15 mL Mouth/Throat BID  . docusate sodium  100 mg Oral BID  . furosemide  80 mg Intravenous Q12H  . gabapentin  100 mg Oral QHS  . insulin aspart  0-15 Units Subcutaneous TID WC  . insulin aspart  0-5 Units Subcutaneous QHS  . insulin glargine  10 Units Subcutaneous QHS  . ipratropium  0.5 mg Nebulization BID  . metoCLOPramide  5 mg Oral TID WC  . multivitamin with minerals  1 tablet Oral Daily  . sodium chloride  3 mL Intravenous Q12H  . sodium chloride  3 mL Intravenous Q12H     I  have reviewed scheduled and prn medications.  ASSESSMENT/RECOMMENDATIONS  72 yo female with a background of DM, HTN, CKD3 (suspect due to DM)  with AKI on CKD in the setting of HCAP, toe gangrene/osteo requiring ray amputation, MRSA bacteremia, possible HCAP, supratherapeutic vancomycin levels and CHF.  CKD with AKI on CKD (baseline 1.3-1.5) Probable ATN Supratherapeutic vanco may well have contributed along with hemodynamic issues of CHF Vanco clearing only very slowly UOP has picked up considerably with lasix and volume much improved (no weights for 3 days but exam much better) Creatinine continues to fall and is approaching baseline (1.7 today; baseline 1.3-1.5)  ANA negative Complements normal  Recommend d/c IV lasix; change to po lasix at lower dose as volume much better (ordered 40 po bid) Continue to follow creatinine ID will start to cubicin for MRSA for shortened course (TEE negative) when vanco <15 (ordered a level for AM).  Last dose was 02/18/12.  At this time, no additional recommendations other than those above. Will sign off. Please call if addition renal issues arise that I can assist with.  Camille Bal, MD [facility] [phone] Pager 02/22/2012, 11:55 AM

## 2012-02-22 NOTE — Progress Notes (Signed)
Speech Language Pathology Dysphagia Treatment Patient Details Name: [name] MRN: [id number] DOB: [birth year] Today's Date: 02/22/2012 Time: 4098-1191 SLP Time Calculation (min): 43 min  Assessment / Plan / Recommendation Clinical Impression  Patient was observed from hallway, eating ice cream in a partially reclined position, and taking very large bites.  SLP entered room and instructed patient to aspiration and reflux precautions.  Patient replied, "I can't take small bites when I'm hungry."  Patient denies dysphagia and requests that diet be advanced to allow solft solids.  Her dentures are now available, and patient was able to chew and swallow crackers without overt difficulty, and following solid with sip of liquid. "I'll take anything I can get.  You people are starving me!"  Will advance to Dys 3 with chopped meats, contine thin liquids, and continue aspiration and reflux precautions.    Diet Recommendation  Initiate / Change Diet: Dysphagia 3 (mechanical soft);Thin liquid (Chopped meats)    SLP Plan Continue with current plan of care   Pertinent Vitals/Pain C/O throbbing pain in foot/toe (reported to MD and RN)   Swallowing Goals  SLP Swallowing Goals Patient will consume recommended diet without observed clinical signs of aspiration with: Moderate assistance Patient will utilize recommended strategies during swallow to increase swallowing safety with: Moderate assistance  General Temperature Spikes Noted: No Respiratory Status: Supplemental O2 delivered via (comment) Behavior/Cognition: Alert;Impulsive Oral Cavity - Dentition: Dentures, top;Dentures, bottom Patient Positioning: Partially reclined  Oral Cavity - Oral Hygiene Does patient have any of the following "at risk" factors?: Oxygen therapy - cannula, mask, simple oxygen devices Brush patient's teeth BID with toothbrush (using toothpaste with fluoride): Yes Patient is HIGH RISK - Oral Care Protocol followed (see row  info): Yes Patient is AT RISK - Oral Care Protocol followed (see row info): Yes Patient is mechanically ventilated, follow VAP prevention protocol for oral care: Oral care provided every 4 hours   Dysphagia Treatment Treatment focused on: Skilled observation of diet tolerance;Patient/family/caregiver education;Utilization of compensatory strategies Treatment Methods/Modalities: Skilled observation Patient observed directly with PO's: Yes Type of PO's observed: Dysphagia 1 (puree);Thin liquids;Dysphagia 3 (soft) Feeding: Total assist Liquids provided via: Straw Type of cueing: Verbal;Tactile Amount of cueing: Minimal   GO     Maryjo Rochester T 02/22/2012, 10:34 AM

## 2012-02-23 ENCOUNTER — Inpatient Hospital Stay (HOSPITAL_COMMUNITY): Payer: Medicare Other

## 2012-02-23 LAB — RENAL FUNCTION PANEL
BUN: 49 mg/dL — ABNORMAL HIGH (ref 6–23)
Creatinine, Ser: 1.28 mg/dL — ABNORMAL HIGH (ref 0.50–1.10)
GFR calc Af Amer: 48 mL/min — ABNORMAL LOW (ref >90)
Glucose, Bld: 201 mg/dL — ABNORMAL HIGH (ref 70–99)
Phosphorus: 3.1 mg/dL (ref 2.3–4.6)
Potassium: 3.8 mEq/L (ref 3.5–5.1)

## 2012-02-23 LAB — GLUCOSE, CAPILLARY
Glucose-Capillary: 193 mg/dL — ABNORMAL HIGH (ref 70–99)
Glucose-Capillary: 190 mg/dL — ABNORMAL HIGH (ref 70–99)

## 2012-02-23 LAB — CBC WITH DIFFERENTIAL/PLATELET
Basophils Absolute: 0.1 10*3/uL (ref 0.0–0.1)
Basophils Relative: 0 % (ref 0–1)
Eosinophils Absolute: 0.8 10*3/uL — ABNORMAL HIGH (ref 0.0–0.7)
Hemoglobin: 9.3 g/dL — ABNORMAL LOW (ref 12.0–15.0)
MCHC: 31.7 g/dL (ref 30.0–36.0)
Monocytes Relative: 6 % (ref 3–12)
Neutro Abs: 15.6 10*3/uL — ABNORMAL HIGH (ref 1.7–7.7)
Neutrophils Relative %: 77 % (ref 43–77)
Platelets: 419 10*3/uL — ABNORMAL HIGH (ref 150–400)
RDW: 16.3 % — ABNORMAL HIGH (ref 11.5–15.5)

## 2012-02-23 LAB — URINALYSIS, MICROSCOPIC ONLY
Bilirubin Urine: NEGATIVE
Ketones, ur: NEGATIVE mg/dL
Nitrite: NEGATIVE
Urobilinogen, UA: 0.2 mg/dL (ref 0.0–1.0)

## 2012-02-23 MED ORDER — FLUCONAZOLE 200 MG PO TABS
200.0000 mg | ORAL_TABLET | Freq: Every day | ORAL | Status: DC
  Administered 2012-02-23 – 2012-02-25 (×3): 200 mg via ORAL
  Filled 2012-02-23 (×4): qty 1

## 2012-02-23 MED ORDER — SODIUM CHLORIDE 0.9 % IV SOLN
600.0000 mg | INTRAVENOUS | Status: DC
  Administered 2012-02-24 – 2012-02-26 (×3): 600 mg via INTRAVENOUS
  Filled 2012-02-23 (×4): qty 12

## 2012-02-23 MED ORDER — INSULIN ASPART 100 UNIT/ML ~~LOC~~ SOLN
2.0000 [IU] | Freq: Three times a day (TID) | SUBCUTANEOUS | Status: DC
  Administered 2012-02-23 – 2012-02-26 (×9): 2 [IU] via SUBCUTANEOUS

## 2012-02-23 NOTE — Progress Notes (Signed)
Subjective:  Awake. No chest pain. Elevated WBC.  Objective:  Vital Signs in the last 24 hours: Temp:  [98.5 F (36.9 C)-98.9 F (37.2 C)] 98.7 F (37.1 C) (01/25 0526) Pulse Rate:  [66-78] 76  (01/25 0526) Cardiac Rhythm:  [-]  Resp:  [16-18] 16  (01/25 0526) BP: (123-154)/(48-64) 134/48 mmHg (01/25 0526) SpO2:  [91 %-98 %] 91 % (01/25 0732) Weight:  [95.8 kg (211 lb 3.2 oz)] 95.8 kg (211 lb 3.2 oz) (01/25 0526)  Physical Exam: BP Readings from Last 1 Encounters:  02/23/12 134/48    Wt Readings from Last 1 Encounters:  02/23/12 95.8 kg (211 lb 3.2 oz)    Weight change:   HEENT: [place]/AT, Eyes-Brown, legally blind, Conjunctiva-Pink, Sclera-Non-icteric Neck: No JVD, No bruit, Trachea midline. Lungs:  Clear, Bilateral. Cardiac:  Regular rhythm, normal S1 and S2, no S3. II/VI systolic murmur Abdomen:  Soft, non-tender. Extremities:  No edema present. No cyanosis. No clubbing. CNS: AxOx3, Cranial nerves grossly intact, moves all 4 extremities. Right handed. Skin: Warm and dry.   Intake/Output from previous day: 01/24 0701 - 01/25 0700 In: 960 [P.O.:960] Out: 2850 [Urine:2850]    Lab Results: BMET    Component Value Date/Time   NA 135 02/23/2012 0500   K 3.8 02/23/2012 0500   CL 98 02/23/2012 0500   CO2 31 02/23/2012 0500   GLUCOSE 201* 02/23/2012 0500   BUN 49* 02/23/2012 0500   CREATININE 1.28* 02/23/2012 0500   CALCIUM 8.6 02/23/2012 0500   GFRNONAA 42* 02/23/2012 0500   GFRAA 48* 02/23/2012 0500   CBC    Component Value Date/Time   WBC 20.3* 02/23/2012 0500   RBC 3.40* 02/23/2012 0500   HGB 9.3* 02/23/2012 0500   HCT 29.3* 02/23/2012 0500   PLT 419* 02/23/2012 0500   MCV 86.2 02/23/2012 0500   MCH 27.4 02/23/2012 0500   MCHC 31.7 02/23/2012 0500   RDW 16.3* 02/23/2012 0500   LYMPHSABS 2.7 02/23/2012 0500   MONOABS 1.2* 02/23/2012 0500   EOSABS 0.8* 02/23/2012 0500   BASOSABS 0.1 02/23/2012 0500   CARDIAC ENZYMES Lab Results  Component Value Date   CKTOTAL 8 02/20/2012   CKMB 2.4 06/27/2011   TROPONINI <0.30 02/14/2012    Scheduled Meds:   . albuterol  2.5 mg Nebulization BID  . antiseptic oral rinse  15 mL Mouth Rinse q12n4p  . aspirin EC  325 mg Oral Daily  . brimonidine  1 drop Left Eye BID  . brinzolamide  1 drop Left Eye BID  . carvedilol  3.125 mg Oral Q breakfast  . chlorhexidine  15 mL Mouth/Throat BID  . DAPTOmycin (CUBICIN)  IV  600 mg Intravenous Q24H  . docusate sodium  100 mg Oral BID  . furosemide  40 mg Oral BID  . gabapentin  100 mg Oral QHS  . insulin aspart  0-15 Units Subcutaneous TID WC  . insulin aspart  0-5 Units Subcutaneous QHS  . insulin glargine  10 Units Subcutaneous QHS  . ipratropium  0.5 mg Nebulization BID  . metoCLOPramide  5 mg Oral TID WC  . multivitamin with minerals  1 tablet Oral Daily  . sodium chloride  3 mL Intravenous Q12H  . sodium chloride  3 mL Intravenous Q12H   Continuous Infusions:  PRN Meds:.sodium chloride, acetaminophen, acetaminophen, albuterol, HYDROcodone-homatropine, ondansetron (ZOFRAN) IV, ondansetron, oxyCODONE, promethazine, RESOURCE THICKENUP CLEAR, sodium chloride, sodium chloride  Assessment/Plan:  Patient Active Hospital Problem List: HCAP (healthcare-associated pneumonia) (02/14/2012) DM (05/19/2009) BLINDNESS, BILATERAL (  05/19/2009) ESSENTIAL HYPERTENSION (05/19/2009) Acute respiratory failure (02/14/2012)   Improved Leukocytosis (02/14/2012) Bacteremia due to Gram-positive bacteria (02/16/2012) MRSA bacteremia (02/17/2012) Necrotic toes (02/17/2012) Sepsis (02/21/2012)  Patient willing to reconsider TEE with minimal sedation and local anaesthesia.    LOS: 9 days    Orpah Cobb  MD  02/23/2012, 10:08 AM

## 2012-02-23 NOTE — Discharge Summary (Addendum)
Physician Discharge Summary  [name] JWJ:[id number] DOB: 1940/05/07 DOA: 02/14/2012  PCP: No primary provider on file.  Admit date: 02/14/2012 Discharge date: 02/26/2012  Recommendations for Outpatient Follow-up:  1. Pt will need to follow up with PCP in 1 weeks post discharge 2. Please obtain BMP to evaluate electrolytes and kidney function 3. Please also check CBC to evaluate Hg and Hct levels 4. Cubicin 600 mg IV every 24 hours until 03/29/2012 Change central line dressing every 5 days and when necessary Remove central line after last dose of Cubicin Check CPK every 7 days, for draw 02/28/2012--notify physician, Dr. Staci Righter if elevated Followup with Dr. Myrtie Neither  02/29/2012 at 2:45 PM Change dressing on right foot daily with Adaptic or vaseline gauze and wrap with kerlix until further instructions from Dr. Myrtie Neither    Discharge Diagnoses:  Sepsis  -Blood cultures on 02/14/2012 subsequently grew MRSA. -Her antibiotic spectrum was noted. Her Zosyn was discontinued. The patient was continued on vancomycin. -secondary to MRSA Bacteremia  MRSA bacteremia  --TEE on 02/24/12--"Occasional tiny strand like mobile shadow seen on anterior leaflet" -Discussed with ID, Dr. Violet Baldy IV Cubicin for 42 days with a stop date of 03/29/2012 -Check CPK to 7 days while on Cubicin -Remove central line after last dose of Cubicin -Change central line dressing every 5 days and when necessary -TEE 02/21/12--patient had respiratory arrest and TEE was canceled--> repeat TTE performed and was negative for vegetation  -no clear source except necrotic right 2nd toe which was amputated on 1/20. TTEshows no vegetations.  -vancomycin. ( currently held for supratherapeutic levels)  -.Appreciate ID recs.  -Has a necrotic right second toe. MRI of the foot showed osteomyelitis. Seen by orthopedic consult and had amputation of the right second toe done on 1/20 and tolerated procedure well.    -Start patient on Cubicin once vancomycin level subtherapeutic  -Baseline CPK 8  -Anticipated stop date 02/015/2014 unless TEE is neg for vegetation  Leukocytosis  -Remains afebrile and hemodynamically and clinically stable.  -WBC trending down again--15,200 on the day of discharge -Stool for C. difficile PCR--neg  -Repeat UA and urine culture --nondiagnostic--D/C fluconazole  -D/C foley  -Blood cultures x2 sets(02/23/12)-negative for 48 hours  -Chest x-ray repeated 02/24/2012 showed improving interstitial edema   Left Basilar Opacity--?PNA  Given significant hypoxia , tachycardia and leukocytosis patient was admitted to step down monitoring. Patient started on empiric antibiotic IV vancomycin. Blood cultures sent on admission growing MRSA (1/16) on both sets.  -patient remains afebrile. Leukocytosis slowly improving.  -Flu negative  -Continue vancomycin  -Vancomycin level random= 17.6  -Cubicin scheduled to start tomorrow  Acute osteomyelitis right second toe  -Status post amputation 02/18/2012  -patient has follow up appt w/Dr. Montez Morita 02/29/12 -Until patient has followup, change wound dressing on the foot with Adaptic or Vaseline gauze and wrap with Kerlix once daily and prn Acute on Chronic kidney disease  -The patient's initial serum creatinine on the day of admission was 1.48. The patient developed acute on chronic renal failure with a serum creatinine continued to rise throughout the hospitalization. Her serum creatinine peaked at 2.82 on 02/20/2012. -Nephrology was consulted to see the patient. It was thought the patient's acute renal failure is likely due to ATN with the possibility of supratherapeutic levels of vancomycin -Workup including ANA and complements were normal -Vancomycin was discontinued and her random levels were allowed to trend down -Serum creatinine has improved with diuresis  - neg 5800cc with intravenous furosemide  -appreciate  renal  -furosemide 40mg  po bid,  follow BMP  -Serum creatinine was 1.11 on the day of discharge -FeNa calculated was less than 1. Not improved with IV hydration. -Renal ultrasound unremarkable. Repeated proBNP was markedly elevated at 14,005.  -Chest x-ray (1/24/) showed vascular congestion, but improving  -2D echo on admission showed mild diastolic dysfn.  -Appreciate nephrology, continue furosemide IV as outlined  -held vancomycin given elevated levels.  -Avoid NSAIDs nephrotoxic agents.  Acute CHF--diastolic  -Initially, the patient's creatinine increased, furosemide was discontinued.  -The patient developed pulmonary edema and fluid overload  -Intravenous furosemide was reintroduced with improvement of the patient's fluid status and respiratory status  -She was subsequently transitioned to oral furosemide without any difficulty  -She'll be continued on furosemide 40 mg by mouth twice a day  -Ejection fraction 55-60%  -Continue furosemide as discussed  -Continue on carvedilol 3.125 mg twice a day Acute respiratory failure  -resolved  -Likely due to volume overload  -stable on room air  Diabetes mellitus  Continue sliding scale insulin and Lantus.  -CBGs rising with better po intake  Hypertension  Currently stable. Continue home medications-coreg  Dysphagia/esophageal dysmotility  -Speech therapy and gastroenterology consultation were obtained. -The patient was initially placed on a dysphagia 1 diet -Subsequent standard barium swallow was obtained and revealed esophageal dysmotility Seen by speech and swallow and MBS done today which showed esophageal dysmotility.  -Patient informs having esophagial dilatation done in 2001 by Dr. Arlyce Dice.  -Appreciate GI recommendations  On dys level 1 diet--speech continues to follow for safest diet  -02/22/12-diet upgraded to dysphagia 3  -Tolerating dysphagia 3 diet without any difficulty IV access--PICC not good option due to vein sclerosis for future HD  -triple lumen was  placed and patient will finish antibiotics with a triple-lumen catheter in place. Family Communication: none at bedside . Patient's legal guardian [name] was called and updated plan on 1/21  Disposition Plan: back to SNF once stable  Consultants:  [facility]  ID  Orthopedics  [facility] Procedures:  Modified barium swallow ( 1/18)  Right second toe amputation on 1/20  Standard barium swallow ordered  Triple lumen placed 02/22/12   Discharge Condition: stable  Disposition:  Follow-up Information    Follow up with Kennieth Rad, MD. (02/29/12 at 245PM)    Contact information:   [address] [place] [address] [phone]        SNF  Diet: Dysphagia 3 carbohydrate modified Wt Readings from Last 3 Encounters:  02/26/12 96.3 kg (212 lb 4.9 oz)  02/26/12 96.3 kg (212 lb 4.9 oz)  02/26/12 96.3 kg (212 lb 4.9 oz)    History of present illness:  72 year old female from skilled nursing facility was brought to emergency department when it was noted that the patient had oxygen saturation of 78% on room air. The patient is not normally on supplemental oxygen. In emergency department, the patient complained of cough. The patient was found to have leukocytosis with WBC 26,900 and left lower lobe opacity on chest x-ray. Blood cultures were obtained and the patient was started on empiric vancomycin and Zosyn.   Consultants: Infectious disease  Discharge Exam: Filed Vitals:   02/26/12 0500  BP: 139/64  Pulse: 74  Temp: 98.3 F (36.8 C)  Resp: 15   Filed Vitals:   02/25/12 2152 02/25/12 2219 02/26/12 0500 02/26/12 0745  BP: 132/47 138/54 139/64   Pulse: 63  74   Temp: 98.3 F (36.8 C)  98.3 F (36.8  C)   TempSrc: Oral  Oral   Resp: 16  15   Height:      Weight:   96.3 kg (212 lb 4.9 oz)   SpO2: 96%  100% 92%   General: A&O, NAD, pleasant, cooperative Cardiovascular: RRR, no rub, no gallop, no S3 Respiratory: CTAB, no wheeze, no rhonchi Abdomen:soft,  nontender, nondistended, positive bowel sounds Extremities: No edema, No lymphangitis, no petechiae  Discharge Instructions      Discharge Orders    Future Orders Please Complete By Expires   Diet - low sodium heart healthy      Increase activity slowly      Discharge instructions      Comments:   Cubicin 600 mg IV every 24 hours until 03/29/2012 Change central line dressing every 5 days and when necessary Remove central line after last dose of Cubicin Check CPK every 7 days, for draw 02/28/2012--notify physician, Dr. Staci Righter if elevated Followup with Dr. Myrtie Neither  02/29/2012 at 2:45 PM Change dressing on right foot daily with Adaptic or vaseline gauze and wrap with kerlix until further instructions from Dr. Myrtie Neither       Medication List     As of 02/26/2012 11:37 AM    STOP taking these medications         ALEVE 220 MG tablet   Generic drug: naproxen sodium      HYDROcodone-acetaminophen 5-325 MG per tablet   Commonly known as: NORCO/VICODIN      ROCEPHIN 1 G injection   Generic drug: cefTRIAXone      ZITHROMAX Z-PAK 250 MG tablet   Generic drug: azithromycin      TAKE these medications         acetaminophen 325 MG tablet   Commonly known as: TYLENOL   Take 2 tablets (650 mg total) by mouth every 6 (six) hours as needed (or Fever >/= 101).      albuterol (5 MG/ML) 0.5% nebulizer solution   Commonly known as: PROVENTIL   Take 0.5 mLs (2.5 mg total) by nebulization every 4 (four) hours as needed for wheezing.      aspirin EC 325 MG tablet   Take 325 mg by mouth daily.      carvedilol 3.125 MG tablet   Commonly known as: COREG   Take 1 tablet (3.125 mg total) by mouth daily with breakfast.      docusate sodium 100 MG capsule   Commonly known as: COLACE   Take 100 mg by mouth 2 (two) times daily.      DUONEB 0.5-2.5 (3) MG/3ML Soln   Generic drug: ipratropium-albuterol   Take 3 mLs by nebulization every 6 (six) hours as needed. wheezing        feeding supplement Liqd   Take 30 mLs by mouth 3 (three) times daily with meals.      furosemide 40 MG tablet   Commonly known as: LASIX   Take 1 tablet (40 mg total) by mouth 2 (two) times daily.      gabapentin 100 MG capsule   Commonly known as: NEURONTIN   Take 100 mg by mouth at bedtime.      insulin aspart 100 UNIT/ML injection   Commonly known as: novoLOG   Inject 0-9 Units into the skin 3 (three) times daily with meals. CBG < 70: implement hypoglycemia protocol  CBG 70 - 120: 0 units  CBG 121 - 150: 1 unit  CBG 151 - 200: 2 units  CBG  201 - 250: 3 units  CBG 251 - 300: 5 units  CBG 301 - 350: 7 units  CBG 351 - 400: 9 units  CBG > 400 call MD.      insulin glargine 100 UNIT/ML injection   Commonly known as: LANTUS   Inject 10 Units into the skin at bedtime.      ipratropium 0.02 % nebulizer solution   Commonly known as: ATROVENT   Take 2.5 mLs (0.5 mg total) by nebulization every 4 (four) hours as needed.      metoCLOPramide 5 MG tablet   Commonly known as: REGLAN   Take 5 mg by mouth 3 (three) times daily.      multivitamin with minerals tablet   Take 1 tablet by mouth daily.      oxyCODONE 5 MG immediate release tablet   Commonly known as: Oxy IR/ROXICODONE   Take 1 tablet (5 mg total) by mouth every 4 (four) hours as needed.      SIMBRINZA 1-0.2 % Susp   Generic drug: Brinzolamide-Brimonidine   Place 1 drop into the left eye 2 (two) times daily.      sodium chloride 0.65 % Soln nasal spray   Commonly known as: OCEAN   Place 1 spray into the nose 4 (four) times daily as needed for congestion.      sodium chloride 0.9 % SOLN 100 mL with DAPTOmycin 500 MG SOLR 600 mg   Inject 600 mg into the vein daily. Stop date of antibiotics is 03/29/12.           The results of significant diagnostics from this hospitalization (including imaging, microbiology, ancillary and laboratory) are listed below for reference.    Significant Diagnostic  Studies: Dg Chest 2 View  02/19/2012  *RADIOLOGY REPORT*  Clinical Data: Weakness.  Evaluate for pulmonary edema  CHEST - 2 VIEW  Comparison: None 02/17/2012  Findings: This examination is technically limited by body habitus and patient positioning (semiupright) Cardiomegaly is stable. There is bilateral pulmonary vascular congestion.  There is a patchy left retrocardiac opacity.  Hazy opacity the right lung base may be due to overlying soft tissues of the breast. On the lateral view, the posterior costophrenic angles are obscured by the table, as the patient was in semiupright position.  IMPRESSION: Cardiomegaly and mild pulmonary vascular congestion. Image detail limited by patient body habitus and positioning. Small left basilar opacity could be due to atelectasis or airspace disease. Small bilateral pleural effusions cannot be excluded, as described above.   Original Report Authenticated By: Britta Mccreedy, M.D.    Dg Chest 2 View  02/17/2012  *RADIOLOGY REPORT*  Clinical Data: Increased cough and shortness of breath.  Question fluid overload.  CHEST - 2 VIEW  Comparison: Chest radiograph 02/15/2012.  Findings: Stable cardiomegaly.  There is diffuse pulmonary vascular congestion with interstitial prominence in the perihilar regions of lung bases.  Patchy airspace opacities in the left mid lung.  Small bilateral pleural effusions.  Thickening of the fissures on the lateral view.  IMPRESSION: Mild congestive heart failure pattern with small bilateral pleural effusions.   Original Report Authenticated By: Britta Mccreedy, M.D.    Ct Head Wo Contrast  02/14/2012  *RADIOLOGY REPORT*  Clinical Data: Altered mental status.  History of weakness and TIA.  CT HEAD WITHOUT CONTRAST  Technique:  Contiguous axial images were obtained from the base of the skull through the vertex without contrast.  Comparison: 12/25/2008  Findings: Bone windows demonstrate similar abnormal appearance  of the right globe, presumably related  to prior trauma or hemorrhage. There are also similar calcifications about the left globe.  Mucosal thickening of the sphenoid sinus and ethmoid air cells.  A left mastoid effusion is again identified.  Soft tissue windows demonstrate similar moderate hydrocephalus, including the fourth, third, and lateral ventricles.  Slight increase in periventricular white matter hypoattenuation.  Redemonstration of right frontal hypoattenuation which is felt to be similar, including on image 20/series 2.  No mass lesion, hemorrhage, intra-axial, or extra-axial fluid collection.  IMPRESSION:  1.  Similar hydrocephalus. 2.  Slight increase in periventricular white matter hypoattenuation.  Favor small vessel ischemic change.  A component of transependymal CSF resorption could look similar. 3.  Right frontal lobe remote infarct. 4.  Sinus disease with a left mastoid effusion.   Original Report Authenticated By: Jeronimo Greaves, M.D.    Dg Esophagus  02/19/2012  *RADIOLOGY REPORT*  Clinical Data: Dilated esophagus on modified barium swallow.  The patient reports no dysphagia or difficulty edema drinking.  ESOPHOGRAM/BARIUM SWALLOW  Technique:  Single contrast examination was performed using thin barium.  Fluoroscopy time:  2.3 minutes.  Comparison:  None.  Findings:  Exam is somewhat limited due to patient immobility. Patient had a toe amputation 1 day prior.  The esophagus is patulous.  There is significant esophageal dysmotility with tertiary contractions and to-and-fro motion of the barium bolus.  There is some mild irregularity at the GE junction with mild narrowing.  A 13 mm barium tablet did pass GE junction easily.  IMPRESSION:  1.  Significant esophageal dysmotility with tertiary contractions and to-and-fro motion of the barium bolus.  Patulous esophagus.  2.  Some mild stricturing of the distal esophagus.  Cannot exclude a mucosal irregularity. 3.  Barium tablet passed through the GE junction easily.   Original Report  Authenticated By: Genevive Bi, M.D.    US Renal  02/17/2012  *RADIOLOGY REPORT*  Clinical Data: Acute renal insufficiency with decreased urine output.  RENAL/URINARY TRACT ULTRASOUND COMPLETE  Comparison:  CT 06/26/2011  Findings:  Right Kidney:  12.9 cm. No hydronephrosis.  Small renal calculi identified. Normal renal cortical thickness and echogenicity.  Left Kidney:  11.9 cm. No hydronephrosis.  Small renal calculi identified. Normal renal cortical thickness and echogenicity.  Bladder:  Collapsed around a Foley catheter.  IMPRESSION: No hydronephrosis.  Renal collecting system calculi, as before.   Original Report Authenticated By: Jeronimo Greaves, M.D.    Mr Foot Right Wo Contrast  02/17/2012  *RADIOLOGY REPORT*  Clinical Data: Necrotic second toe.  MRI OF THE RIGHT FOREFOOT WITHOUT CONTRAST  Technique:  Multiplanar, multisequence MR imaging was performed. No intravenous contrast was administered.  Comparison: Radiographs 02/17/2012.  Findings: Abnormal signal intensity in the distal phalanx of the second toe suspicious for osteomyelitis.  No findings to suggest septic arthritis.  There is mild diffuse cellulitis and diffuse myofasciitis.  No focal soft tissue abscess.  IMPRESSION:  1.  Osteomyelitis involving the distal phalanx of the second toe. 2.  Cellulitis and myofasciitis without focal soft tissue abscess.   Original Report Authenticated By: Rudie Meyer, M.D.    Dg Chest Port 1 View  02/23/2012  *RADIOLOGY REPORT*  Clinical Data: Shortness of breath.  Leukocytosis.  Follow up CHF/pulmonary edema.  PORTABLE CHEST - 1 VIEW 02/23/2012 1046 hours:  Comparison: Portable chest x-ray yesterday.  Two-view chest x-ray 02/19/2012, 02/17/2012.  Findings: Cardiac silhouette enlarged but stable.  Interval improvement in the interstitial and airspace pulmonary edema since  yesterday, with residual minimal perihilar airspace opacities bilaterally.  No new pulmonary parenchymal abnormalities.  Left jugular  central venous catheter tip projects over the lower SVC, unchanged.  IMPRESSION: Improving pulmonary edema, with residual perihilar airspace edema persisting.  No new abnormalities.   Original Report Authenticated By: Hulan Saas, M.D.    Dg Chest Port 1 View  02/22/2012  *RADIOLOGY REPORT*  Clinical Data: Line placement.  PORTABLE CHEST - 1 VIEW  Comparison: 02/19/2012  Findings: Left central line has been placed with the tip in the upper right atrium.  No pneumothorax.  Cardiomegaly with vascular congestion and diffuse bilateral opacities, likely mild edema.  No effusions.  IMPRESSION: Left central line tip in the upright atrium.  No pneumothorax.  Mild bilateral airspace opacities, presumably mild edema.   Original Report Authenticated By: Charlett Nose, M.D.    Portable Chest 1 View  02/15/2012  *RADIOLOGY REPORT*  Clinical Data: CHF.  PORTABLE CHEST - 1 VIEW  Comparison: 02/14/2012  Findings: Shallow inspiration.  Cardiac enlargement with pulmonary vascular congestion and perihilar infiltration suggesting edema or pneumonia.  The infiltration is slightly more prominent than previous study.  No pneumothorax or effusion.  IMPRESSION: Cardiac enlargement with pulmonary vascular congestion.  Increasing perihilar infiltrates or edema bilaterally.   Original Report Authenticated By: Burman Nieves, M.D.    Dg Chest Portable 1 View  02/14/2012  *RADIOLOGY REPORT*  Clinical Data: Shortness of breath and diabetes.  Hypertension.  PORTABLE CHEST - 1 VIEW  Comparison: Two-view chest x-ray and CT of the chest 06/22/2011.  Findings: The heart is mildly enlarged.  Lung volumes are low. Mild interstitial and airspace disease is present bilaterally. The bone windows are unremarkable.  IMPRESSION:  1.  Mild cardiomegaly and edema is concerning for congestive heart failure. 2.  There is some airspace disease bilaterally which raises the possibility of infection as well.   Original Report Authenticated By: Marin Roberts, M.D.    Dg Foot 2 Views Right  02/17/2012  *RADIOLOGY REPORT*  Clinical Data: Necrotic second toe right foot.  History of diabetes.  Staph bacteremia.  RIGHT FOOT - 2 VIEW  Comparison: None.  Findings: Underlying renal osteodystrophy with vascular calcifications.  Only AP and lateral radiographs performed; no oblique imaging.  Overlap of the digits on the lateral view. Question soft tissue ulcer about the lateral aspect of the second toe distally on AP view.  No gross osseous destruction.  No callus deposition.  IMPRESSION: Limited 2 view study, demonstrating no gross osseous destruction. Possible soft tissue defect about the second toe laterally.  If ongoing clinical concern, consider contrast enhanced MRI.   Original Report Authenticated By: Jeronimo Greaves, M.D.    Dg Swallowing Func-speech Pathology  02/16/2012  Chales Abrahams, CCC-SLP     02/16/2012 10:47 AM Objective Swallowing Evaluation: Modified Barium Swallowing Study   Patient Details  Name: Johnette Teigen MRN: [id number] Date of Birth: [birth year]  Today's Date: 02/16/2012 Time: 0950-1015 SLP Time Calculation (min): 25 min  Past Medical History:  Past Medical History  Diagnosis Date  . Hypertension   . Diabetes mellitus   . Blindness   . TIA (transient ischemic attack)   . Anemia   . GERD (gastroesophageal reflux disease)   . Renal disorder   . Constipation    Past Surgical History:  Past Surgical History  Procedure Date  . Gallbladder surgery   . Esophageal dilation   . Cholecystectomy    HPI:  72 yo female adm to [facility] from  [facility] with  hypoxia, CXR + fluid, +/- pna- diagnosed with HCAP, ? CHF (await  BNP).  Per MD notes, pt told MD that she "always has pna."   PMH  + for TIA, GERD, anemia, constipation, HTN, blindness/HOH. SLP  phoned SNF SLP who reports she has not seen pt, she has however  seen the pt's roommate during meals and has not noted pt coughing  during meals.  Per SNF SLP, pt was recently "flagged for weight   loss" .  Esophageal dilatation completed in 03/1999 and pt states  she had problems swallowing before surgery that resolved after.    BSE completed Thursday pm and follow up Friday with  recommendation for MBS due to inability to rule out  dysphagia/aspiration at bedside with pt overtly coughing.     Assessment / Plan / Recommendation Clinical Impression  Dysphagia Diagnosis: Suspected primary esophageal dysphagia;Mild  oral phase dysphagia;Mild pharyngeal phase dysphagia   Clinical impression: Pt presents with mild oropharyngeal and  suspected primary esophageal dysphagia.  Decreased oral control  results in premature spillage of liquids into pharynx and  piecemeal deglutition with solids/pudding as well as oral stasis  with poor awareness.  Cued swallow facilitated timeliness of  swallow.  Pharyngeal dysphagia with delayed swallow initiation to  pyriform sinus with liquids, to vallecular space with  solid/pudding and  resultant trace silent penetration of liquids.   Cued throat clear removed trace penetrates but pt requiried  verbal cues x4 to complete and became agitated.  Pt prematurely  spills pudding/cracker filling entire vallecular space for longer  than 30 seconds as she continued to orally manipulate remainder  of bolus before she actually triggers a swallow.  No aspiration  or penetration with pudding/cracker noted.  Pt did not arrive to  test with her dentures unfortunately, therefore cracker was  softened.       Suspect esophageal deficits significantly contribute to  oropharyngeal deficits.  Appearance of prominent cricopharyngeus  with retention of secretions x1, CP did not impact barium flow.   However pt did appear with stasis from distal to mid esophagus  with distal narrowing.  Above area of narrowed region, esophagus  appeared dilated (? Motility issues).  Retrograde propulsion of  barium occurred WITHOUT pt awareness.   Suspect pt's primary  dysphagia is esophageal.    Pt was instructed to  compensatory strategies, testing findings  and recommendations during the procedure. Pt may benefit from GI  referral for esophageal dysphagia management (h/o esophageal  dilatation in 2001).  In the interim time, rec liquid diet in  small amounts for maximal airway protection and medications be  crushed given with liquids if not contraindicated.  SLP to  follow.  Thanks.       Treatment Recommendation  Therapy as outlined in treatment plan below    Diet Recommendation Thin liquid (All Liquids)   Liquid Administration via: Cup;Straw Medication Administration: Other (Comment) (crush with liquids) Supervision: Full supervision/cueing for compensatory strategies Compensations: Small sips/bites;Slow rate;Clear throat  intermittently Postural Changes and/or Swallow Maneuvers: Seated upright 90  degrees;Upright 30-60 min after meal    Other  Recommendations Recommended Consults: Consider GI  evaluation Oral Care Recommendations: Oral care BID   Follow Up Recommendations       Frequency and Duration min 2x/week  1 week   Pertinent Vitals/Pain     SLP Swallow Goals Patient will utilize recommended strategies during swallow to  increase swallowing safety with: Total assistance   General  HPI: 72 yo female adm to [facility] from [facility]  SNF with hypoxia, CXR + fluid, +/- pna- diagnosed with HCAP, ?  CHF (await BNP).  Per MD notes, pt told MD that she "always has  pna."   PMH + for TIA, GERD, anemia, constipation, HTN,  blindness/HOH. SLP phoned SNF SLP who reports she has not seen  pt, she has however seen the pt's roommate during meals and has  not noted pt coughing during meals.  Per SNF SLP, pt was recently  "flagged for weight loss" .  Esophageal dilatation completed in  03/1999 and pt states she had problems swallowing before surgery  that resolved after.   BSE completed Thursday pm and follow up  Friday with recommendation for MBS due to inability to rule out  dysphagia/aspiration at bedside with pt overtly  coughing. Type of Study: Modified Barium Swallowing Study Reason for Referral: Objectively evaluate swallowing function Diet Prior to this Study: Nectar-thick liquids;Regular Temperature Spikes Noted: No Respiratory Status: Supplemental O2 delivered via (comment) Behavior/Cognition: Alert;Cooperative;Requires cueing;Decreased  sustained attention;Agitated (easily agitated when asked  questions) Oral Cavity - Dentition: Dentures, not available (dentures left  in pt's room) Oral Motor / Sensory Function: Impaired - see Bedside swallow  eval Self-Feeding Abilities: Needs assist Patient Positioning: Upright in chair Baseline Vocal Quality: Clear Volitional Swallow: Unable to elicit (pt states she didn't have  anything to swallow) Anatomy: Within functional limits Pharyngeal Secretions: Not observed secondary MBS    Reason for Referral Objectively evaluate swallowing function   Oral Phase Oral Preparation/Oral Phase Oral Phase: Impaired Oral - Nectar Oral - Nectar Teaspoon:  (decr oral control results in premature  spillage ) Oral - Nectar Cup:  (decr oral control results in premature  spillage) Oral - Thin Oral - Thin Teaspoon:  (decr oral control results in premature  spillage) Oral - Thin Cup:  (decr oral control results in premature  spillage) Oral - Thin Straw:  (decr oral control results in premature  spillage) Oral - Solids Oral - Puree: Weak lingual manipulation;Delayed oral  transit;Piecemeal swallowing;Lingual/palatal residue Oral - Regular: Piecemeal swallowing;Weak lingual  manipulation;Delayed oral transit;Impaired  mastication;Lingual/palatal residue Oral Phase - Comment Oral Phase - Comment: decr oral control results in premature  spillage of all liquids, pt did not have dentures present for  exam- cracker was softened with thin barium and pudding   Pharyngeal Phase Pharyngeal Phase Pharyngeal Phase: Impaired Pharyngeal - Nectar Pharyngeal - Nectar Teaspoon: Premature spillage to valleculae Pharyngeal -  Nectar Cup: Delayed swallow initiation;Premature  spillage to pyriform sinuses Pharyngeal - Thin Pharyngeal - Thin Teaspoon: Premature spillage to pyriform  sinuses;Penetration/Aspiration during swallow Penetration/Aspiration details (thin teaspoon): Material enters  airway, remains ABOVE vocal cords then ejected out Pharyngeal - Thin Cup: Premature spillage to pyriform  sinuses;Penetration/Aspiration during swallow Penetration/Aspiration details (thin cup): Material enters  airway, CONTACTS cords and not ejected out (cued throat clear  removed trace amount of penetration) Pharyngeal - Thin Straw: Premature spillage to pyriform sinuses Pharyngeal - Solids Pharyngeal - Puree: Delayed swallow initiation;Premature spillage  to pyriform sinuses (retention of barium at vallecular space  before swlalow) Pharyngeal - Regular: Premature spillage to valleculae;Delayed  swallow initiation (retention of cracker at vallecular space  before swallow)  Cervical Esophageal Phase    GO    Cervical Esophageal Phase Cervical Esophageal Phase: Impaired Cervical Esophageal Phase - Comment Cervical Esophageal Comment: appearance of prominent  cricopharyngeus with retention of secretions x1, did not impact  barium flow, appearance of  stasis with distal narrowing and  appearance of dilated esophagus above narrowed region, retrograde  propulsion of barium appeared WITHOUT pt awareness          Donavan Burnet, MS [facility] SLP [phone]       Microbiology: Recent Results (from the past 240 hour(s))  CULTURE, BLOOD (ROUTINE X 2)     Status: Normal   Collection Time   02/16/12  4:30 PM      Component Value Range Status Comment   Specimen Description BLOOD RIGHT HAND  3 ML IN [facility] BOTTLE   Final    Special Requests NONE   Final    Culture  Setup Time 02/16/2012 23:17   Final    Culture NO GROWTH 5 DAYS   Final    Report Status 02/22/2012 FINAL   Final   CULTURE, BLOOD (ROUTINE X 2)     Status: Normal   Collection Time   02/16/12  4:48  PM      Component Value Range Status Comment   Specimen Description BLOOD RIGHT ARM  2 ML IN [facility] BOTTLE   Final    Special Requests NONE   Final    Culture  Setup Time 02/16/2012 23:16   Final    Culture NO GROWTH 5 DAYS   Final    Report Status 02/22/2012 FINAL   Final   SURGICAL PCR SCREEN     Status: Normal   Collection Time   02/18/12  5:25 AM      Component Value Range Status Comment   MRSA, PCR NEGATIVE  NEGATIVE Final    Staphylococcus aureus NEGATIVE  NEGATIVE Final   URINE CULTURE     Status: Normal   Collection Time   02/19/12 10:40 AM      Component Value Range Status Comment   Specimen Description URINE, CATHETERIZED   Final    Special Requests NONE   Final    Culture  Setup Time 02/20/2012 01:28   Final    Colony Count NO GROWTH   Final    Culture NO GROWTH   Final    Report Status 02/20/2012 FINAL   Final   CULTURE, BLOOD (ROUTINE X 2)     Status: Normal (Preliminary result)   Collection Time   02/23/12 10:58 AM      Component Value Range Status Comment   Specimen Description BLOOD LEFT ARM   Final    Special Requests A   Final    Culture  Setup Time 02/23/2012 22:40   Final    Culture     Final    Value:        BLOOD CULTURE RECEIVED NO GROWTH TO DATE CULTURE WILL BE HELD FOR 5 DAYS BEFORE ISSUING A FINAL NEGATIVE REPORT   Report Status PENDING   Incomplete   CULTURE, BLOOD (ROUTINE X 2)     Status: Normal (Preliminary result)   Collection Time   02/23/12 11:07 AM      Component Value Range Status Comment   Specimen Description BLOOD LEFT HAND   Final    Special Requests BOTTLES DRAWN AEROBIC ONLY 1CC   Final    Culture  Setup Time 02/23/2012 22:40   Final    Culture     Final    Value:        BLOOD CULTURE RECEIVED NO GROWTH TO DATE CULTURE WILL BE HELD FOR 5 DAYS BEFORE ISSUING A FINAL NEGATIVE REPORT   Report Status PENDING   Incomplete  URINE CULTURE     Status: Normal   Collection Time   02/23/12  3:44 PM      Component Value Range Status Comment    Specimen Description URINE, CLEAN CATCH   Final    Special Requests NONE   Final    Culture  Setup Time 02/24/2012 03:06   Final    Colony Count NO GROWTH   Final    Culture NO GROWTH   Final    Report Status 02/25/2012 FINAL   Final   CLOSTRIDIUM DIFFICILE BY PCR     Status: Normal   Collection Time   02/24/12 10:52 AM      Component Value Range Status Comment   C difficile by pcr NEGATIVE  NEGATIVE Final      Labs: Basic Metabolic Panel:  Lab 02/26/12 0454 02/25/12 0600 02/24/12 0520 02/23/12 0500 02/22/12 0540 02/21/12 0609 02/20/12 0500  NA 135 132* 134* 135 138 -- --  K 3.6 3.8 -- -- -- -- --  CL 95* 95* 95* 98 99 -- --  CO2 30 28 30 31 27  -- --  GLUCOSE 111* 179* 201* 201* 192* -- --  BUN 39* 40* 40* 49* 58* -- --  CREATININE 1.11* 1.15* 1.14* 1.28* 1.70* -- --  CALCIUM 8.8 8.7 8.7 8.6 8.9 -- --  MG -- -- -- -- -- -- --  PHOS -- -- -- 3.1 3.3 4.1 5.0*   Liver Function Tests:  Lab 02/23/12 0500 02/22/12 0540 02/21/12 0609 02/20/12 0500  AST -- -- -- --  ALT -- -- -- --  ALKPHOS -- -- -- --  BILITOT -- -- -- --  PROT -- -- -- --  ALBUMIN 2.1* 2.2* 2.0* 2.0*   No results found for this basename: LIPASE:5,AMYLASE:5 in the last 168 hours No results found for this basename: AMMONIA:5 in the last 168 hours CBC:  Lab 02/26/12 0710 02/25/12 0600 02/24/12 0520 02/23/12 0500 02/22/12 0540 02/20/12 0710  WBC 15.2* 17.5* 19.2* 20.3* 15.8* --  NEUTROABS -- -- 14.7* 15.6* -- 10.5*  HGB 8.7* 8.7* 9.2* 9.3* 10.4* --  HCT 27.8* 27.9* 28.2* 29.3* 31.7* --  MCV 87.1 86.9 86.2 86.2 84.1 --  PLT 408* 363 413* 419* 391 --   Cardiac Enzymes:  Lab 02/24/12 0520 02/20/12 0710  CKTOTAL 12 8  CKMB -- --  CKMBINDEX -- --  TROPONINI -- --   BNP: No components found with this basename: POCBNP:5 CBG:  Lab 02/26/12 0759 02/25/12 2201 02/25/12 1750 02/25/12 1230 02/25/12 1208  GLUCAP 136* 157* 151* 138* 144*    Time coordinating discharge:  Greater than 30  minutes  Signed:  Nevaen Tredway, DO [facility] Pager: [phone] 02/26/2012, 11:37 AM

## 2012-02-23 NOTE — Progress Notes (Signed)
ANTIBIOTIC CONSULT NOTE - INITIAL  Pharmacy Consult for Daptomycin Indication: MRSA bacteremia  No Known Allergies  Patient Measurements: Height: 5\' 2"  (157.5 cm) Weight: 211 lb 3.2 oz (95.8 kg) IBW/kg (Calculated) : 50.1    Vital Signs: Temp: 98.7 F (37.1 C) (01/25 0526) Temp src: Oral (01/25 0526) BP: 134/48 mmHg (01/25 0526) Pulse Rate: 76  (01/25 0526) Intake/Output from previous day: 01/24 0701 - 01/25 0700 In: 960 [P.O.:960] Out: 2850 [Urine:2850] Intake/Output from this shift:    Labs:  Basename 02/23/12 0500 02/22/12 0540 02/21/12 0609  WBC 20.3* 15.8* --  HGB 9.3* 10.4* --  PLT 419* 391 --  LABCREA -- -- --  CREATININE 1.28* 1.70* 2.28*   Estimated Creatinine Clearance: 44.8 ml/min (by C-G formula based on Cr of 1.28).  Basename 02/23/12 0500 02/22/12 0540  VANCOTROUGH -- --  VANCOPEAK -- --  VANCORANDOM 17.6 26.8  GENTTROUGH -- --  GENTPEAK -- --  GENTRANDOM -- --  TOBRATROUGH -- --  TOBRAPEAK -- --  TOBRARND -- --  AMIKACINPEAK -- --  AMIKACINTROU -- --  AMIKACIN -- --     Microbiology: Recent Results (from the past 720 hour(s))  CULTURE, BLOOD (ROUTINE X 2)     Status: Normal   Collection Time   02/14/12 10:35 AM      Component Value Range Status Comment   Specimen Description BLOOD RIGHT HAND   Final    Special Requests BOTTLES DRAWN AEROBIC ONLY   Final    Culture  Setup Time 02/14/2012 14:49   Final    Culture     Final    Value: METHICILLIN RESISTANT STAPHYLOCOCCUS AUREUS     Note: RIFAMPIN AND GENTAMICIN SHOULD NOT BE USED AS SINGLE DRUGS FOR TREATMENT OF STAPH INFECTIONS. CRITICAL RESULT CALLED TO, READ BACK BY AND VERIFIED WITH: BETHANY STRINGER 02/16/12 @ 8:22PM BY RUSCA.     Note: Gram Stain Report Called to,Read Back By and Verified With: JESSICA CONRAD 02/15/12 1440 BY SMITHERSJ   Report Status 02/17/2012 FINAL   Final    Organism ID, Bacteria METHICILLIN RESISTANT STAPHYLOCOCCUS AUREUS   Final   CULTURE, BLOOD (ROUTINE X 2)      Status: Normal   Collection Time   02/14/12 10:45 AM      Component Value Range Status Comment   Specimen Description BLOOD RIGHT FOREARM   Final    Special Requests BOTTLES DRAWN AEROBIC ONLY   Final    Culture  Setup Time 02/14/2012 14:49   Final    Culture     Final    Value: METHICILLIN RESISTANT STAPHYLOCOCCUS AUREUS     Note: SUSCEPTIBILITIES PERFORMED ON PREVIOUS CULTURE WITHIN THE LAST 5 DAYS. CRITICAL RESULT CALLED TO, READ BACK BY AND VERIFIED WITH: BETHANY STRINGER 02/16/12 @ 8:22PM BY RUSCA.     Note: CRITICAL RESULT CALLED TO, READ BACK BY AND VERIFIED WITH: MILLIE PRICE 02/16/12 @ 10:55AM BY RUSCA.   Report Status 02/17/2012 FINAL   Final   URINE CULTURE     Status: Normal   Collection Time   02/14/12 10:46 AM      Component Value Range Status Comment   Specimen Description URINE, CATHETERIZED   Final    Special Requests NONE   Final    Culture  Setup Time 02/14/2012 18:58   Final    Colony Count NO GROWTH   Final    Culture NO GROWTH   Final    Report Status 02/15/2012 FINAL   Final  MRSA PCR SCREENING     Status: Normal   Collection Time   02/14/12  3:08 PM      Component Value Range Status Comment   MRSA by PCR NEGATIVE  NEGATIVE Final   CULTURE, BLOOD (ROUTINE X 2)     Status: Normal   Collection Time   02/16/12  4:30 PM      Component Value Range Status Comment   Specimen Description BLOOD RIGHT HAND  3 ML IN [facility] BOTTLE   Final    Special Requests NONE   Final    Culture  Setup Time 02/16/2012 23:17   Final    Culture NO GROWTH 5 DAYS   Final    Report Status 02/22/2012 FINAL   Final   CULTURE, BLOOD (ROUTINE X 2)     Status: Normal   Collection Time   02/16/12  4:48 PM      Component Value Range Status Comment   Specimen Description BLOOD RIGHT ARM  2 ML IN [facility] BOTTLE   Final    Special Requests NONE   Final    Culture  Setup Time 02/16/2012 23:16   Final    Culture NO GROWTH 5 DAYS   Final    Report Status 02/22/2012 FINAL   Final   SURGICAL PCR  SCREEN     Status: Normal   Collection Time   02/18/12  5:25 AM      Component Value Range Status Comment   MRSA, PCR NEGATIVE  NEGATIVE Final    Staphylococcus aureus NEGATIVE  NEGATIVE Final   URINE CULTURE     Status: Normal   Collection Time   02/19/12 10:40 AM      Component Value Range Status Comment   Specimen Description URINE, CATHETERIZED   Final    Special Requests NONE   Final    Culture  Setup Time 02/20/2012 01:28   Final    Colony Count NO GROWTH   Final    Culture NO GROWTH   Final    Report Status 02/20/2012 FINAL   Final     Medical History: Past Medical History  Diagnosis Date  . Hypertension   . Diabetes mellitus   . Blindness   . TIA (transient ischemic attack)   . Anemia   . GERD (gastroesophageal reflux disease)   . Renal disorder   . Constipation     Medications:  Scheduled:    . albuterol  2.5 mg Nebulization BID  . antiseptic oral rinse  15 mL Mouth Rinse q12n4p  . aspirin EC  325 mg Oral Daily  . brimonidine  1 drop Left Eye BID  . brinzolamide  1 drop Left Eye BID  . carvedilol  3.125 mg Oral Q breakfast  . chlorhexidine  15 mL Mouth/Throat BID  . docusate sodium  100 mg Oral BID  . furosemide  40 mg Oral BID  . gabapentin  100 mg Oral QHS  . insulin aspart  0-15 Units Subcutaneous TID WC  . insulin aspart  0-5 Units Subcutaneous QHS  . insulin glargine  10 Units Subcutaneous QHS  . ipratropium  0.5 mg Nebulization BID  . metoCLOPramide  5 mg Oral TID WC  . multivitamin with minerals  1 tablet Oral Daily  . sodium chloride  3 mL Intravenous Q12H  . sodium chloride  3 mL Intravenous Q12H  . [DISCONTINUED] furosemide  80 mg Intravenous Q12H   Infusions:   PRN: sodium chloride, acetaminophen, acetaminophen, albuterol, HYDROcodone-homatropine, ondansetron (  ZOFRAN) IV, ondansetron, oxyCODONE, promethazine, RESOURCE THICKENUP CLEAR, sodium chloride, sodium chloride  Assessment:  72 y/o F with MRSA bacteremia, previously on vancomycin but  being switched to daptomycin due to acute on chronic renal insufficiency.   Orders from ID service are to begin daptomycin 6 mg/kg IV q24h when vancomycin level < 15.    Vancomycin level 30.3 on 1/22, 26.8 on 1/24, 17.6 this AM. Serum creatinine steadily improving, estimated CrCl ~ 45 mL/min.  Anticipate vancomycin level will be <15 by tomorrow AM.  Goal of Therapy:  Eradication of infection Adjust daptomycin dosage for weight and renal function  Plan:  1. Tomorrow AM, begin daptomycin 600 mg (6mg /kg) IV q24h. 2.  Follow CPK weekly. 3.  Follow SCr.  If estimated CrCl falls below 30 mL/min, would switch dosing interval to q48h.  Elie Goody, PharmD, BCPS Pager: [phone] 02/23/2012  8:19 AM    Nadezhda Pollitt, [place] Barban 02/23/2012,8:04 AM

## 2012-02-23 NOTE — Progress Notes (Signed)
[facility] PROGRESS NOTE  [name] ZOX:[id number] DOB: October 03, 1940 DOA: 02/14/2012 PCP: No primary provider on file.  Assessment/Plan: Sepsis  -secondary to MRSA Bacteremia  MRSA bacteremia  -Dr. Sharyn Lull wants to repeat TEE on 02/24/12 -TEE 02/21/12--patient had respiratory arrest and TEE was canceled--> repeat TTE performed and was negative for vegetation  -no clear source except necrotic right 2nd toe which was amputated on 1/20. TTEshows no vegetations.  -vancomycin. ( currently held for supratherapeutic levels)  -.Appreciate ID recs.  -Has a necrotic right second toe. MRI of the foot showed osteomyelitis. Seen by orthopedic consult and had amputation of the right second toe done on 1/20 and tolerated procedure well.  -Start patient on Cubicin once vancomycin level subtherapeutic  -Baseline CPK 8  -Anticipated stop date 02/015/2014 unless TEE is neg for vegetation Leukocytosis -WBC increase the past 24-48 hours -Stool for C. difficile PCR -Repeat UA and urine culture -Blood cultures x2 sets -Chest x-ray  Left Basilar Opacity--?PNA  Given significant hypoxia , tachycardia and leukocytosis patient was admitted to step down monitoring. Patient started on empiric antibiotic IV vancomycin. Blood cultures sent on admission growing MRSA (1/16) on both sets.  -patient remains afebrile. Leukocytosis slowly improving.  -Flu negative  -Continue vancomycin  -Vancomycin level random= 17.6 -Cubicin scheduled to start tomorrow Acute osteomyelitis right second toe  -Status post amputation 02/18/2012  -Defer wound care recommendations to Dr. Montez Morita  Acute on Chronic kidney disease  -Serum creatinine has improved with diuresis  - neg 5800cc with intravenous furosemide  -appreciate renal  -furosemide 40mg  po bid, follow BMP  -FeNa calculated was less than 1. Not improved with IV hydration. -Renal ultrasound unremarkable. Repeated proBNP was markedly elevated at 14,005.  -Chest x-ray  (1/24/) showed vascular congestion, but improving  -2D echo on admission showed mild diastolic dysfn.  -Appreciate nephrology, continue furosemide IV as outlined  -held vancomycin given elevated levels.   -Avoid NSAIDs nephrotoxic agents.  Acute CHF--diastolic  -Ejection fraction 55-60%  -Clinically remains hypervolemic  -Continue furosemide as discussed  -neg 1890cc for past 24 hours Acute respiratory failure  -resolved  -Likely due to volume overload  -stable on room air  Diabetes mellitus  Continue sliding scale insulin and Lantus.  -CBGs rising with better po intake  -Add NovoLog 2 units pre-meal  Hypertension  Currently stable. Continue home medications-coreg  Dysphagia/esophageal dysmotility  Seen by speech and swallow and MBS done today which showed esophageal dysmotility.  -Patient informs having esophagial dilatation done in 2001 by Dr. Arlyce Dice.  -Appreciate GI recommendations  On dys level 1 diet--speech continues to follow for safest diet  -02/22/12-diet upgraded to dysphagia 3  IV access--PICC not good option due to vein sclerosis for future HD  -triple lumen  Family Communication: none at bedside . Patient's legal guardian [name] was called and updated plan on 1/21  Disposition Plan: back to SNF once stable  Consultants:  [facility]  ID  Orthopedics  [facility] Procedures:  Modified barium swallow ( 1/18)  Right second toe amputation on 1/20  Standard barium swallow ordered  Triple lumen placed 02/22/12        Procedures/Studies: Dg Chest 2 View  02/19/2012  *RADIOLOGY REPORT*  Clinical Data: Weakness.  Evaluate for pulmonary edema  CHEST - 2 VIEW  Comparison: None 02/17/2012  Findings: This examination is technically limited by body habitus and patient positioning (semiupright) Cardiomegaly is stable. There is bilateral pulmonary vascular congestion.  There is a patchy left retrocardiac opacity.  Hazy opacity  the right lung base may be due to  overlying soft tissues of the breast. On the lateral view, the posterior costophrenic angles are obscured by the table, as the patient was in semiupright position.  IMPRESSION: Cardiomegaly and mild pulmonary vascular congestion. Image detail limited by patient body habitus and positioning. Small left basilar opacity could be due to atelectasis or airspace disease. Small bilateral pleural effusions cannot be excluded, as described above.   Original Report Authenticated By: Britta Mccreedy, M.D.    Dg Chest 2 View  02/17/2012  *RADIOLOGY REPORT*  Clinical Data: Increased cough and shortness of breath.  Question fluid overload.  CHEST - 2 VIEW  Comparison: Chest radiograph 02/15/2012.  Findings: Stable cardiomegaly.  There is diffuse pulmonary vascular congestion with interstitial prominence in the perihilar regions of lung bases.  Patchy airspace opacities in the left mid lung.  Small bilateral pleural effusions.  Thickening of the fissures on the lateral view.  IMPRESSION: Mild congestive heart failure pattern with small bilateral pleural effusions.   Original Report Authenticated By: Britta Mccreedy, M.D.    Ct Head Wo Contrast  02/14/2012  *RADIOLOGY REPORT*  Clinical Data: Altered mental status.  History of weakness and TIA.  CT HEAD WITHOUT CONTRAST  Technique:  Contiguous axial images were obtained from the base of the skull through the vertex without contrast.  Comparison: 12/25/2008  Findings: Bone windows demonstrate similar abnormal appearance of the right globe, presumably related to prior trauma or hemorrhage. There are also similar calcifications about the left globe.  Mucosal thickening of the sphenoid sinus and ethmoid air cells.  A left mastoid effusion is again identified.  Soft tissue windows demonstrate similar moderate hydrocephalus, including the fourth, third, and lateral ventricles.  Slight increase in periventricular white matter hypoattenuation.  Redemonstration of right frontal  hypoattenuation which is felt to be similar, including on image 20/series 2.  No mass lesion, hemorrhage, intra-axial, or extra-axial fluid collection.  IMPRESSION:  1.  Similar hydrocephalus. 2.  Slight increase in periventricular white matter hypoattenuation.  Favor small vessel ischemic change.  A component of transependymal CSF resorption could look similar. 3.  Right frontal lobe remote infarct. 4.  Sinus disease with a left mastoid effusion.   Original Report Authenticated By: Jeronimo Greaves, M.D.    Dg Esophagus  02/19/2012  *RADIOLOGY REPORT*  Clinical Data: Dilated esophagus on modified barium swallow.  The patient reports no dysphagia or difficulty edema drinking.  ESOPHOGRAM/BARIUM SWALLOW  Technique:  Single contrast examination was performed using thin barium.  Fluoroscopy time:  2.3 minutes.  Comparison:  None.  Findings:  Exam is somewhat limited due to patient immobility. Patient had a toe amputation 1 day prior.  The esophagus is patulous.  There is significant esophageal dysmotility with tertiary contractions and to-and-fro motion of the barium bolus.  There is some mild irregularity at the GE junction with mild narrowing.  A 13 mm barium tablet did pass GE junction easily.  IMPRESSION:  1.  Significant esophageal dysmotility with tertiary contractions and to-and-fro motion of the barium bolus.  Patulous esophagus.  2.  Some mild stricturing of the distal esophagus.  Cannot exclude a mucosal irregularity. 3.  Barium tablet passed through the GE junction easily.   Original Report Authenticated By: Genevive Bi, M.D.    US Renal  02/17/2012  *RADIOLOGY REPORT*  Clinical Data: Acute renal insufficiency with decreased urine output.  RENAL/URINARY TRACT ULTRASOUND COMPLETE  Comparison:  CT 06/26/2011  Findings:  Right Kidney:  12.9 cm.  No hydronephrosis.  Small renal calculi identified. Normal renal cortical thickness and echogenicity.  Left Kidney:  11.9 cm. No hydronephrosis.  Small renal  calculi identified. Normal renal cortical thickness and echogenicity.  Bladder:  Collapsed around a Foley catheter.  IMPRESSION: No hydronephrosis.  Renal collecting system calculi, as before.   Original Report Authenticated By: Jeronimo Greaves, M.D.    Mr Foot Right Wo Contrast  02/17/2012  *RADIOLOGY REPORT*  Clinical Data: Necrotic second toe.  MRI OF THE RIGHT FOREFOOT WITHOUT CONTRAST  Technique:  Multiplanar, multisequence MR imaging was performed. No intravenous contrast was administered.  Comparison: Radiographs 02/17/2012.  Findings: Abnormal signal intensity in the distal phalanx of the second toe suspicious for osteomyelitis.  No findings to suggest septic arthritis.  There is mild diffuse cellulitis and diffuse myofasciitis.  No focal soft tissue abscess.  IMPRESSION:  1.  Osteomyelitis involving the distal phalanx of the second toe. 2.  Cellulitis and myofasciitis without focal soft tissue abscess.   Original Report Authenticated By: Rudie Meyer, M.D.    Dg Chest Port 1 View  02/22/2012  *RADIOLOGY REPORT*  Clinical Data: Line placement.  PORTABLE CHEST - 1 VIEW  Comparison: 02/19/2012  Findings: Left central line has been placed with the tip in the upper right atrium.  No pneumothorax.  Cardiomegaly with vascular congestion and diffuse bilateral opacities, likely mild edema.  No effusions.  IMPRESSION: Left central line tip in the upright atrium.  No pneumothorax.  Mild bilateral airspace opacities, presumably mild edema.   Original Report Authenticated By: Charlett Nose, M.D.    Portable Chest 1 View  02/15/2012  *RADIOLOGY REPORT*  Clinical Data: CHF.  PORTABLE CHEST - 1 VIEW  Comparison: 02/14/2012  Findings: Shallow inspiration.  Cardiac enlargement with pulmonary vascular congestion and perihilar infiltration suggesting edema or pneumonia.  The infiltration is slightly more prominent than previous study.  No pneumothorax or effusion.  IMPRESSION: Cardiac enlargement with pulmonary vascular  congestion.  Increasing perihilar infiltrates or edema bilaterally.   Original Report Authenticated By: Burman Nieves, M.D.    Dg Chest Portable 1 View  02/14/2012  *RADIOLOGY REPORT*  Clinical Data: Shortness of breath and diabetes.  Hypertension.  PORTABLE CHEST - 1 VIEW  Comparison: Two-view chest x-ray and CT of the chest 06/22/2011.  Findings: The heart is mildly enlarged.  Lung volumes are low. Mild interstitial and airspace disease is present bilaterally. The bone windows are unremarkable.  IMPRESSION:  1.  Mild cardiomegaly and edema is concerning for congestive heart failure. 2.  There is some airspace disease bilaterally which raises the possibility of infection as well.   Original Report Authenticated By: Marin Roberts, M.D.    Dg Foot 2 Views Right  02/17/2012  *RADIOLOGY REPORT*  Clinical Data: Necrotic second toe right foot.  History of diabetes.  Staph bacteremia.  RIGHT FOOT - 2 VIEW  Comparison: None.  Findings: Underlying renal osteodystrophy with vascular calcifications.  Only AP and lateral radiographs performed; no oblique imaging.  Overlap of the digits on the lateral view. Question soft tissue ulcer about the lateral aspect of the second toe distally on AP view.  No gross osseous destruction.  No callus deposition.  IMPRESSION: Limited 2 view study, demonstrating no gross osseous destruction. Possible soft tissue defect about the second toe laterally.  If ongoing clinical concern, consider contrast enhanced MRI.   Original Report Authenticated By: Jeronimo Greaves, M.D.    Dg Swallowing Func-speech Pathology  02/16/2012  Chales Abrahams, CCC-SLP  02/16/2012 10:47 AM Objective Swallowing Evaluation: Modified Barium Swallowing Study   Patient Details  Name: [name] MRN: [id number] Date of Birth: 09/30/1940  Today's Date: 02/16/2012 Time: 0950-1015 SLP Time Calculation (min): 25 min  Past Medical History:  Past Medical History  Diagnosis Date  . Hypertension   . Diabetes  mellitus   . Blindness   . TIA (transient ischemic attack)   . Anemia   . GERD (gastroesophageal reflux disease)   . Renal disorder   . Constipation    Past Surgical History:  Past Surgical History  Procedure Date  . Gallbladder surgery   . Esophageal dilation   . Cholecystectomy    HPI:  72 yo female adm to [facility] from [facility] SNF with  hypoxia, CXR + fluid, +/- pna- diagnosed with HCAP, ? CHF (await  BNP).  Per MD notes, pt told MD that she "always has pna."   PMH  + for TIA, GERD, anemia, constipation, HTN, blindness/HOH. SLP  phoned SNF SLP who reports she has not seen pt, she has however  seen the pt's roommate during meals and has not noted pt coughing  during meals.  Per SNF SLP, pt was recently "flagged for weight  loss" .  Esophageal dilatation completed in 03/1999 and pt states  she had problems swallowing before surgery that resolved after.    BSE completed Thursday pm and follow up Friday with  recommendation for MBS due to inability to rule out  dysphagia/aspiration at bedside with pt overtly coughing.     Assessment / Plan / Recommendation Clinical Impression  Dysphagia Diagnosis: Suspected primary esophageal dysphagia;Mild  oral phase dysphagia;Mild pharyngeal phase dysphagia   Clinical impression: Pt presents with mild oropharyngeal and  suspected primary esophageal dysphagia.  Decreased oral control  results in premature spillage of liquids into pharynx and  piecemeal deglutition with solids/pudding as well as oral stasis  with poor awareness.  Cued swallow facilitated timeliness of  swallow.  Pharyngeal dysphagia with delayed swallow initiation to  pyriform sinus with liquids, to vallecular space with  solid/pudding and  resultant trace silent penetration of liquids.   Cued throat clear removed trace penetrates but pt requiried  verbal cues x4 to complete and became agitated.  Pt prematurely  spills pudding/cracker filling entire vallecular space for longer  than 30 seconds as she  continued to orally manipulate remainder  of bolus before she actually triggers a swallow.  No aspiration  or penetration with pudding/cracker noted.  Pt did not arrive to  test with her dentures unfortunately, therefore cracker was  softened.       Suspect esophageal deficits significantly contribute to  oropharyngeal deficits.  Appearance of prominent cricopharyngeus  with retention of secretions x1, CP did not impact barium flow.   However pt did appear with stasis from distal to mid esophagus  with distal narrowing.  Above area of narrowed region, esophagus  appeared dilated (? Motility issues).  Retrograde propulsion of  barium occurred WITHOUT pt awareness.   Suspect pt's primary  dysphagia is esophageal.    Pt was instructed to compensatory strategies, testing findings  and recommendations during the procedure. Pt may benefit from GI  referral for esophageal dysphagia management (h/o esophageal  dilatation in 2001).  In the interim time, rec liquid diet in  small amounts for maximal airway protection and medications be  crushed given with liquids if not contraindicated.  SLP to  follow.  Thanks.       Treatment  Recommendation  Therapy as outlined in treatment plan below    Diet Recommendation Thin liquid (All Liquids)   Liquid Administration via: Cup;Straw Medication Administration: Other (Comment) (crush with liquids) Supervision: Full supervision/cueing for compensatory strategies Compensations: Small sips/bites;Slow rate;Clear throat  intermittently Postural Changes and/or Swallow Maneuvers: Seated upright 90  degrees;Upright 30-60 min after meal    Other  Recommendations Recommended Consults: Consider GI  evaluation Oral Care Recommendations: Oral care BID   Follow Up Recommendations       Frequency and Duration min 2x/week  1 week   Pertinent Vitals/Pain     SLP Swallow Goals Patient will utilize recommended strategies during swallow to  increase swallowing safety with: Total assistance   General HPI: 72  yo female adm to [facility] from [facility]  SNF with hypoxia, CXR + fluid, +/- pna- diagnosed with HCAP, ?  CHF (await BNP).  Per MD notes, pt told MD that she "always has  pna."   PMH + for TIA, GERD, anemia, constipation, HTN,  blindness/HOH. SLP phoned SNF SLP who reports she has not seen  pt, she has however seen the pt's roommate during meals and has  not noted pt coughing during meals.  Per SNF SLP, pt was recently  "flagged for weight loss" .  Esophageal dilatation completed in  03/1999 and pt states she had problems swallowing before surgery  that resolved after.   BSE completed Thursday pm and follow up  Friday with recommendation for MBS due to inability to rule out  dysphagia/aspiration at bedside with pt overtly coughing. Type of Study: Modified Barium Swallowing Study Reason for Referral: Objectively evaluate swallowing function Diet Prior to this Study: Nectar-thick liquids;Regular Temperature Spikes Noted: No Respiratory Status: Supplemental O2 delivered via (comment) Behavior/Cognition: Alert;Cooperative;Requires cueing;Decreased  sustained attention;Agitated (easily agitated when asked  questions) Oral Cavity - Dentition: Dentures, not available (dentures left  in pt's room) Oral Motor / Sensory Function: Impaired - see Bedside swallow  eval Self-Feeding Abilities: Needs assist Patient Positioning: Upright in chair Baseline Vocal Quality: Clear Volitional Swallow: Unable to elicit (pt states she didn't have  anything to swallow) Anatomy: Within functional limits Pharyngeal Secretions: Not observed secondary MBS    Reason for Referral Objectively evaluate swallowing function   Oral Phase Oral Preparation/Oral Phase Oral Phase: Impaired Oral - Nectar Oral - Nectar Teaspoon:  (decr oral control results in premature  spillage ) Oral - Nectar Cup:  (decr oral control results in premature  spillage) Oral - Thin Oral - Thin Teaspoon:  (decr oral control results in premature  spillage) Oral - Thin  Cup:  (decr oral control results in premature  spillage) Oral - Thin Straw:  (decr oral control results in premature  spillage) Oral - Solids Oral - Puree: Weak lingual manipulation;Delayed oral  transit;Piecemeal swallowing;Lingual/palatal residue Oral - Regular: Piecemeal swallowing;Weak lingual  manipulation;Delayed oral transit;Impaired  mastication;Lingual/palatal residue Oral Phase - Comment Oral Phase - Comment: decr oral control results in premature  spillage of all liquids, pt did not have dentures present for  exam- cracker was softened with thin barium and pudding   Pharyngeal Phase Pharyngeal Phase Pharyngeal Phase: Impaired Pharyngeal - Nectar Pharyngeal - Nectar Teaspoon: Premature spillage to valleculae Pharyngeal - Nectar Cup: Delayed swallow initiation;Premature  spillage to pyriform sinuses Pharyngeal - Thin Pharyngeal - Thin Teaspoon: Premature spillage to pyriform  sinuses;Penetration/Aspiration during swallow Penetration/Aspiration details (thin teaspoon): Material enters  airway, remains ABOVE vocal cords then ejected out Pharyngeal - Thin Cup: Premature spillage  to pyriform  sinuses;Penetration/Aspiration during swallow Penetration/Aspiration details (thin cup): Material enters  airway, CONTACTS cords and not ejected out (cued throat clear  removed trace amount of penetration) Pharyngeal - Thin Straw: Premature spillage to pyriform sinuses Pharyngeal - Solids Pharyngeal - Puree: Delayed swallow initiation;Premature spillage  to pyriform sinuses (retention of barium at vallecular space  before swlalow) Pharyngeal - Regular: Premature spillage to valleculae;Delayed  swallow initiation (retention of cracker at vallecular space  before swallow)  Cervical Esophageal Phase    GO    Cervical Esophageal Phase Cervical Esophageal Phase: Impaired Cervical Esophageal Phase - Comment Cervical Esophageal Comment: appearance of prominent  cricopharyngeus with retention of secretions x1, did not impact   barium flow, appearance of stasis with distal narrowing and  appearance of dilated esophagus above narrowed region, retrograde  propulsion of barium appeared WITHOUT pt awareness          Donavan Burnet, MS [facility] SLP [phone]           Subjective:   Objective: Filed Vitals:   02/22/12 1938 02/22/12 2054 02/23/12 0526 02/23/12 0732  BP:  154/64 134/48   Pulse:  78 76   Temp:  98.9 F (37.2 C) 98.7 F (37.1 C)   TempSrc:  Oral Oral   Resp:  18 16   Height:      Weight:   95.8 kg (211 lb 3.2 oz)   SpO2: 92% 96% 93% 91%    Intake/Output Summary (Last 24 hours) at 02/23/12 1020 Last data filed at 02/23/12 0951  Gross per 24 hour  Intake    723 ml  Output   2850 ml  Net  -2127 ml   Weight change:  Exam:   General:  Pt is alert, follows commands appropriately, not in acute distress  HEENT: No icterus, No thrush, No neck mass, [place]/AT  Cardiovascular: RRR, S1/S2, no rubs, no gallops  Respiratory: CTA bilaterally, no wheezing, no crackles, no rhonchi  Abdomen: Soft/+BS, non tender, non distended, no guarding  Extremities: No edema, No lymphangitis, No petechiae, No rashes, no synovitis  Data Reviewed: Basic Metabolic Panel:  Lab 02/23/12 2440 02/22/12 0540 02/21/12 0609 02/20/12 0500 02/19/12 0830  NA 135 138 135 132* 133*  K 3.8 3.5 3.7 3.7 3.9  CL 98 99 99 95* 96  CO2 31 27 24 21 20   GLUCOSE 201* 192* 194* 160* 109*  BUN 49* 58* 71* 76* 79*  CREATININE 1.28* 1.70* 2.28* 2.82* 2.68*  CALCIUM 8.6 8.9 8.8 8.9 8.9  MG -- -- -- -- --  PHOS 3.1 3.3 4.1 5.0* --   Liver Function Tests:  Lab 02/23/12 0500 02/22/12 0540 02/21/12 0609 02/20/12 0500  AST -- -- -- --  ALT -- -- -- --  ALKPHOS -- -- -- --  BILITOT -- -- -- --  PROT -- -- -- --  ALBUMIN 2.1* 2.2* 2.0* 2.0*   No results found for this basename: LIPASE:5,AMYLASE:5 in the last 168 hours No results found for this basename: AMMONIA:5 in the last 168 hours CBC:  Lab 02/23/12 0500 02/22/12 0540 02/20/12  0710  WBC 20.3* 15.8* 14.0*  NEUTROABS 15.6* -- 10.5*  HGB 9.3* 10.4* 9.1*  HCT 29.3* 31.7* 28.1*  MCV 86.2 84.1 84.6  PLT 419* 391 379   Cardiac Enzymes:  Lab 02/20/12 0710  CKTOTAL 8  CKMB --  CKMBINDEX --  TROPONINI --   BNP: No components found with this basename: POCBNP:5 CBG:  Lab 02/23/12 0840 02/22/12 2159 02/22/12 1647  02/22/12 1246 02/22/12 0748  GLUCAP 193* 266* 236* 244* 185*    Recent Results (from the past 240 hour(s))  CULTURE, BLOOD (ROUTINE X 2)     Status: Normal   Collection Time   02/14/12 10:35 AM      Component Value Range Status Comment   Specimen Description BLOOD RIGHT HAND   Final    Special Requests BOTTLES DRAWN AEROBIC ONLY   Final    Culture  Setup Time 02/14/2012 14:49   Final    Culture     Final    Value: METHICILLIN RESISTANT STAPHYLOCOCCUS AUREUS     Note: RIFAMPIN AND GENTAMICIN SHOULD NOT BE USED AS SINGLE DRUGS FOR TREATMENT OF STAPH INFECTIONS. CRITICAL RESULT CALLED TO, READ BACK BY AND VERIFIED WITH: BETHANY STRINGER 02/16/12 @ 8:22PM BY RUSCA.     Note: Gram Stain Report Called to,Read Back By and Verified With: JESSICA CONRAD 02/15/12 1440 BY SMITHERSJ   Report Status 02/17/2012 FINAL   Final    Organism ID, Bacteria METHICILLIN RESISTANT STAPHYLOCOCCUS AUREUS   Final   CULTURE, BLOOD (ROUTINE X 2)     Status: Normal   Collection Time   02/14/12 10:45 AM      Component Value Range Status Comment   Specimen Description BLOOD RIGHT FOREARM   Final    Special Requests BOTTLES DRAWN AEROBIC ONLY   Final    Culture  Setup Time 02/14/2012 14:49   Final    Culture     Final    Value: METHICILLIN RESISTANT STAPHYLOCOCCUS AUREUS     Note: SUSCEPTIBILITIES PERFORMED ON PREVIOUS CULTURE WITHIN THE LAST 5 DAYS. CRITICAL RESULT CALLED TO, READ BACK BY AND VERIFIED WITH: BETHANY STRINGER 02/16/12 @ 8:22PM BY RUSCA.     Note: CRITICAL RESULT CALLED TO, READ BACK BY AND VERIFIED WITH: MILLIE PRICE 02/16/12 @ 10:55AM BY RUSCA.   Report  Status 02/17/2012 FINAL   Final   URINE CULTURE     Status: Normal   Collection Time   02/14/12 10:46 AM      Component Value Range Status Comment   Specimen Description URINE, CATHETERIZED   Final    Special Requests NONE   Final    Culture  Setup Time 02/14/2012 18:58   Final    Colony Count NO GROWTH   Final    Culture NO GROWTH   Final    Report Status 02/15/2012 FINAL   Final   MRSA PCR SCREENING     Status: Normal   Collection Time   02/14/12  3:08 PM      Component Value Range Status Comment   MRSA by PCR NEGATIVE  NEGATIVE Final   CULTURE, BLOOD (ROUTINE X 2)     Status: Normal   Collection Time   02/16/12  4:30 PM      Component Value Range Status Comment   Specimen Description BLOOD RIGHT HAND  3 ML IN [facility] BOTTLE   Final    Special Requests NONE   Final    Culture  Setup Time 02/16/2012 23:17   Final    Culture NO GROWTH 5 DAYS   Final    Report Status 02/22/2012 FINAL   Final   CULTURE, BLOOD (ROUTINE X 2)     Status: Normal   Collection Time   02/16/12  4:48 PM      Component Value Range Status Comment   Specimen Description BLOOD RIGHT ARM  2 ML IN [facility] BOTTLE   Final  Special Requests NONE   Final    Culture  Setup Time 02/16/2012 23:16   Final    Culture NO GROWTH 5 DAYS   Final    Report Status 02/22/2012 FINAL   Final   SURGICAL PCR SCREEN     Status: Normal   Collection Time   02/18/12  5:25 AM      Component Value Range Status Comment   MRSA, PCR NEGATIVE  NEGATIVE Final    Staphylococcus aureus NEGATIVE  NEGATIVE Final   URINE CULTURE     Status: Normal   Collection Time   02/19/12 10:40 AM      Component Value Range Status Comment   Specimen Description URINE, CATHETERIZED   Final    Special Requests NONE   Final    Culture  Setup Time 02/20/2012 01:28   Final    Colony Count NO GROWTH   Final    Culture NO GROWTH   Final    Report Status 02/20/2012 FINAL   Final      Scheduled Meds:   . albuterol  2.5 mg Nebulization BID  . antiseptic oral  rinse  15 mL Mouth Rinse q12n4p  . aspirin EC  325 mg Oral Daily  . brimonidine  1 drop Left Eye BID  . brinzolamide  1 drop Left Eye BID  . carvedilol  3.125 mg Oral Q breakfast  . chlorhexidine  15 mL Mouth/Throat BID  . DAPTOmycin (CUBICIN)  IV  600 mg Intravenous Q24H  . docusate sodium  100 mg Oral BID  . furosemide  40 mg Oral BID  . gabapentin  100 mg Oral QHS  . insulin aspart  0-15 Units Subcutaneous TID WC  . insulin aspart  0-5 Units Subcutaneous QHS  . insulin glargine  10 Units Subcutaneous QHS  . ipratropium  0.5 mg Nebulization BID  . metoCLOPramide  5 mg Oral TID WC  . multivitamin with minerals  1 tablet Oral Daily  . sodium chloride  3 mL Intravenous Q12H  . sodium chloride  3 mL Intravenous Q12H   Continuous Infusions:    Isaack Preble, DO  [facility] Pager [phone]  If 7PM-7AM, please contact night-coverage [email or web address] Password [email or web address] 02/23/2012, 10:20 AM   LOS: 9 days

## 2012-02-24 LAB — BASIC METABOLIC PANEL
BUN: 40 mg/dL — ABNORMAL HIGH (ref 6–23)
CO2: 30 mEq/L (ref 19–32)
Chloride: 95 mEq/L — ABNORMAL LOW (ref 96–112)
GFR calc non Af Amer: 48 mL/min — ABNORMAL LOW (ref >90)
Glucose, Bld: 201 mg/dL — ABNORMAL HIGH (ref 70–99)
Potassium: 3.9 mEq/L (ref 3.5–5.1)

## 2012-02-24 LAB — CBC WITH DIFFERENTIAL/PLATELET
Eosinophils Absolute: 0.7 10*3/uL (ref 0.0–0.7)
Hemoglobin: 9.2 g/dL — ABNORMAL LOW (ref 12.0–15.0)
Lymphocytes Relative: 14 % (ref 12–46)
Lymphs Abs: 2.6 10*3/uL (ref 0.7–4.0)
MCH: 28.1 pg (ref 26.0–34.0)
Monocytes Relative: 6 % (ref 3–12)
Neutrophils Relative %: 77 % (ref 43–77)
RBC: 3.27 MIL/uL — ABNORMAL LOW (ref 3.87–5.11)
WBC: 19.2 10*3/uL — ABNORMAL HIGH (ref 4.0–10.5)

## 2012-02-24 LAB — GLUCOSE, CAPILLARY
Glucose-Capillary: 172 mg/dL — ABNORMAL HIGH (ref 70–99)
Glucose-Capillary: 237 mg/dL — ABNORMAL HIGH (ref 70–99)
Glucose-Capillary: 179 mg/dL — ABNORMAL HIGH (ref 70–99)

## 2012-02-24 NOTE — Progress Notes (Signed)
Subjective:  Sleeping. Elevated WBC. T max 98.9 F  Objective:  Vital Signs in the last 24 hours: Temp:  [98 F (36.7 C)-98.9 F (37.2 C)] 98.9 F (37.2 C) (01/26 0600) Pulse Rate:  [73] 73  (01/26 0600) Cardiac Rhythm:  [-]  Resp:  [18] 18  (01/26 0600) BP: (114-126)/(50-65) 114/50 mmHg (01/26 0600) SpO2:  [93 %-97 %] 93 % (01/26 0600) Weight:  [95.1 kg (209 lb 10.5 oz)] 95.1 kg (209 lb 10.5 oz) (01/26 0600)  Physical Exam: BP Readings from Last 1 Encounters:  02/24/12 114/50    Wt Readings from Last 1 Encounters:  02/24/12 95.1 kg (209 lb 10.5 oz)    Weight change: -0.7 kg (-1 lb 8.7 oz)  HEENT: /AT, Eyes-Brown, legally blind, Conjunctiva-Pale piink, Sclera-Non-icteric Neck: No JVD, No bruit, Trachea midline. Lungs:  Scattered rhonchi, Bilateral. Cardiac:  Regular rhythm, normal S1 and S2, no S3. II/VI systolic murmur. Abdomen:  Soft, non-tender. Extremities:  No edema present. No cyanosis. No clubbing. CNS: AxOx3, Cranial nerves grossly intact, moves all 4 extremities. Right handed. Skin: Warm and dry.   Intake/Output from previous day: 01/25 0701 - 01/26 0700 In: 1423 [P.O.:1420; I.V.:3] Out: 1750 [Urine:1750]    Lab Results: BMET    Component Value Date/Time   NA 134* 02/24/2012 0520   K 3.9 02/24/2012 0520   CL 95* 02/24/2012 0520   CO2 30 02/24/2012 0520   GLUCOSE 201* 02/24/2012 0520   BUN 40* 02/24/2012 0520   CREATININE 1.14* 02/24/2012 0520   CALCIUM 8.7 02/24/2012 0520   GFRNONAA 48* 02/24/2012 0520   GFRAA 56* 02/24/2012 0520   CBC    Component Value Date/Time   WBC 19.2* 02/24/2012 0520   RBC 3.27* 02/24/2012 0520   HGB 9.2* 02/24/2012 0520   HCT 28.2* 02/24/2012 0520   PLT 413* 02/24/2012 0520   MCV 86.2 02/24/2012 0520   MCH 28.1 02/24/2012 0520   MCHC 32.6 02/24/2012 0520   RDW 16.3* 02/24/2012 0520   LYMPHSABS 2.6 02/24/2012 0520   MONOABS 1.1* 02/24/2012 0520   EOSABS 0.7 02/24/2012 0520   BASOSABS 0.1 02/24/2012 0520   CARDIAC ENZYMES Lab Results    Component Value Date   CKTOTAL 12 02/24/2012   CKMB 2.4 06/27/2011   TROPONINI <0.30 02/14/2012    Scheduled Meds:   . albuterol  2.5 mg Nebulization BID  . antiseptic oral rinse  15 mL Mouth Rinse q12n4p  . aspirin EC  325 mg Oral Daily  . brimonidine  1 drop Left Eye BID  . brinzolamide  1 drop Left Eye BID  . carvedilol  3.125 mg Oral Q breakfast  . chlorhexidine  15 mL Mouth/Throat BID  . DAPTOmycin (CUBICIN)  IV  600 mg Intravenous Q24H  . docusate sodium  100 mg Oral BID  . fluconazole  200 mg Oral Daily  . furosemide  40 mg Oral BID  . gabapentin  100 mg Oral QHS  . insulin aspart  0-15 Units Subcutaneous TID WC  . insulin aspart  0-5 Units Subcutaneous QHS  . insulin aspart  2 Units Subcutaneous TID WC  . insulin glargine  10 Units Subcutaneous QHS  . ipratropium  0.5 mg Nebulization BID  . metoCLOPramide  5 mg Oral TID WC  . multivitamin with minerals  1 tablet Oral Daily  . sodium chloride  3 mL Intravenous Q12H  . 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

## 2012-02-24 NOTE — Progress Notes (Addendum)
[facility] PROGRESS NOTE  [name] ZOX:[id number] DOB: 70/29/1942 DOA: 02/14/2012 PCP: No primary provider on file.  Assessment/Plan: Sepsis  -secondary to MRSA Bacteremia  MRSA bacteremia  -Dr. Sharyn Lull wants to repeat TEE on 02/24/12  -TEE 02/21/12--patient had respiratory arrest and TEE was canceled--> repeat TTE performed and was negative for vegetation  -no clear source except necrotic right 2nd toe which was amputated on 1/20. TTEshows no vegetations.  -vancomycin. ( currently held for supratherapeutic levels)  -.Appreciate ID recs.  -Has a necrotic right second toe. MRI of the foot showed osteomyelitis. Seen by orthopedic consult and had amputation of the right second toe done on 1/20 and tolerated procedure well.  -Start patient on Cubicin once vancomycin level subtherapeutic  -Baseline CPK 8  -Anticipated stop date 02/015/2014 unless TEE is neg for vegetation  -Cubicin started 02/24/2012 Leukocytosis  -Remains afebrile and hemodynamically and clinically stable. -WBC increase the past 24-48 hours  -Stool for C. difficile PCR--pending -Repeat UA and urine culture --UA showed yeast-->fluconazole until final culture data -Blood cultures x2 sets-negative for 24 hours -Chest x-ray repeated 02/24/2012 showed improving interstitial edema Left Basilar Opacity--?PNA  Given significant hypoxia , tachycardia and leukocytosis patient was admitted to step down monitoring. Patient started on empiric antibiotic IV vancomycin. Blood cultures sent on admission growing MRSA (1/16) on both sets.  -patient remains afebrile.  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 Montez Morita  Acute on Chronic kidney disease  -Serum creatinine has improved with diuresis  - neg 5800cc with intravenous furosemide  -appreciate renal  -continue furosemide 40mg  po bid, follow BMP  -FeNa  calculated was less than 1. Not improved with IV hydration. -Renal ultrasound unremarkable. Repeated proBNP was markedly elevated at 14,005.  -Chest x-ray (02/22/12) showed vascular congestion, but improving  -Repeat chest x-ray on January 25 showed improvement in interstitial edema -2D echo on admission showed mild diastolic dysfn.  -Appreciate nephrology, continue furosemide IV as outlined  -held vancomycin given elevated levels.  -Avoid NSAIDs nephrotoxic agents.  Acute CHF--diastolic  -Ejection fraction 55-60%  -Clinically remains hypervolemic  -Continue furosemide as discussed po -Maintain negative fluid balance Acute respiratory failure  -resolved  -Likely due to volume overload  -stable on room air  Diabetes mellitus  Continue sliding scale insulin and Lantus.  -CBGs rising with better po intake  -Add NovoLog 2 units pre-meal  Hypertension  Currently stable. Continue home medications-coreg  Dysphagia/esophageal dysmotility  Seen by speech and swallow and MBS done today which showed esophageal dysmotility.  -Patient informs having esophagial dilatation done in 2001 by Dr. Arlyce Dice.  -Appreciate GI recommendations  On dys level 1 diet--speech continues to follow for safest diet  -02/22/12-diet upgraded to dysphagia 3  IV access--PICC not good option due to vein sclerosis for future HD  -triple lumen   Family Communication: none at bedside . Patient's legal guardian [name] was called and updated plan on 1/21  Disposition Plan: back to SNF once stable  Consultants:  [facility]  ID  Orthopedics  [place] [facility] Procedures:  Modified barium swallow ( 1/18)  Right second toe amputation on 1/20  Standard barium swallow ordered  Triple lumen placed 02/22/12         Procedures/Studies: Dg Chest 2 View  02/19/2012  *RADIOLOGY REPORT*  Clinical Data: Weakness.  Evaluate for pulmonary edema  CHEST - 2 VIEW  Comparison: None 02/17/2012  Findings: This examination  is  technically limited by body habitus and patient positioning (semiupright) Cardiomegaly is stable. There is bilateral pulmonary vascular congestion.  There is a patchy left retrocardiac opacity.  Hazy opacity the right lung base may be due to overlying soft tissues of the breast. On the lateral view, the posterior costophrenic angles are obscured by the table, as the patient was in semiupright position.  IMPRESSION: Cardiomegaly and mild pulmonary vascular congestion. Image detail limited by patient body habitus and positioning. Small left basilar opacity could be due to atelectasis or airspace disease. Small bilateral pleural effusions cannot be excluded, as described above.   Original Report Authenticated By: Britta Mccreedy, M.D.    Dg Chest 2 View  02/17/2012  *RADIOLOGY REPORT*  Clinical Data: Increased cough and shortness of breath.  Question fluid overload.  CHEST - 2 VIEW  Comparison: Chest radiograph 02/15/2012.  Findings: Stable cardiomegaly.  There is diffuse pulmonary vascular congestion with interstitial prominence in the perihilar regions of lung bases.  Patchy airspace opacities in the left mid lung.  Small bilateral pleural effusions.  Thickening of the fissures on the lateral view.  IMPRESSION: Mild congestive heart failure pattern with small bilateral pleural effusions.   Original Report Authenticated By: Britta Mccreedy, M.D.    Ct Head Wo Contrast  02/14/2012  *RADIOLOGY REPORT*  Clinical Data: Altered mental status.  History of weakness and TIA.  CT HEAD WITHOUT CONTRAST  Technique:  Contiguous axial images were obtained from the base of the skull through the vertex without contrast.  Comparison: 12/25/2008  Findings: Bone windows demonstrate similar abnormal appearance of the right globe, presumably related to prior trauma or hemorrhage. There are also similar calcifications about the left globe.  Mucosal thickening of the sphenoid sinus and ethmoid air cells.  A left mastoid effusion is again  identified.  Soft tissue windows demonstrate similar moderate hydrocephalus, including the fourth, third, and lateral ventricles.  Slight increase in periventricular white matter hypoattenuation.  Redemonstration of right frontal hypoattenuation which is felt to be similar, including on image 20/series 2.  No mass lesion, hemorrhage, intra-axial, or extra-axial fluid collection.  IMPRESSION:  1.  Similar hydrocephalus. 2.  Slight increase in periventricular white matter hypoattenuation.  Favor small vessel ischemic change.  A component of transependymal CSF resorption could look similar. 3.  Right frontal lobe remote infarct. 4.  Sinus disease with a left mastoid effusion.   Original Report Authenticated By: Jeronimo Greaves, M.D.    Dg Esophagus  02/19/2012  *RADIOLOGY REPORT*  Clinical Data: Dilated esophagus on modified barium swallow.  The patient reports no dysphagia or difficulty edema drinking.  ESOPHOGRAM/BARIUM SWALLOW  Technique:  Single contrast examination was performed using thin barium.  Fluoroscopy time:  2.3 minutes.  Comparison:  None.  Findings:  Exam is somewhat limited due to patient immobility. Patient had a toe amputation 1 day prior.  The esophagus is patulous.  There is significant esophageal dysmotility with tertiary contractions and to-and-fro motion of the barium bolus.  There is some mild irregularity at the GE junction with mild narrowing.  A 13 mm barium tablet did pass GE junction easily.  IMPRESSION:  1.  Significant esophageal dysmotility with tertiary contractions and to-and-fro motion of the barium bolus.  Patulous esophagus.  2.  Some mild stricturing of the distal esophagus.  Cannot exclude a mucosal irregularity. 3.  Barium tablet passed through the GE junction easily.   Original Report Authenticated By: Genevive Bi, M.D.    US Renal  02/17/2012  *  RADIOLOGY REPORT*  Clinical Data: Acute renal insufficiency with decreased urine output.  RENAL/URINARY TRACT ULTRASOUND  COMPLETE  Comparison:  CT 06/26/2011  Findings:  Right Kidney:  12.9 cm. No hydronephrosis.  Small renal calculi identified. Normal renal cortical thickness and echogenicity.  Left Kidney:  11.9 cm. No hydronephrosis.  Small renal calculi identified. Normal renal cortical thickness and echogenicity.  Bladder:  Collapsed around a Foley catheter.  IMPRESSION: No hydronephrosis.  Renal collecting system calculi, as before.   Original Report Authenticated By: Jeronimo Greaves, M.D.    Mr Foot Right Wo Contrast  02/17/2012  *RADIOLOGY REPORT*  Clinical Data: Necrotic second toe.  MRI OF THE RIGHT FOREFOOT WITHOUT CONTRAST  Technique:  Multiplanar, multisequence MR imaging was performed. No intravenous contrast was administered.  Comparison: Radiographs 02/17/2012.  Findings: Abnormal signal intensity in the distal phalanx of the second toe suspicious for osteomyelitis.  No findings to suggest septic arthritis.  There is mild diffuse cellulitis and diffuse myofasciitis.  No focal soft tissue abscess.  IMPRESSION:  1.  Osteomyelitis involving the distal phalanx of the second toe. 2.  Cellulitis and myofasciitis without focal soft tissue abscess.   Original Report Authenticated By: Rudie Meyer, M.D.    Dg Chest Port 1 View  02/23/2012  *RADIOLOGY REPORT*  Clinical Data: Shortness of breath.  Leukocytosis.  Follow up CHF/pulmonary edema.  PORTABLE CHEST - 1 VIEW 02/23/2012 1046 hours:  Comparison: Portable chest x-ray yesterday.  Two-view chest x-ray 02/19/2012, 02/17/2012.  Findings: Cardiac silhouette enlarged but stable.  Interval improvement in the interstitial and airspace pulmonary edema since yesterday, with residual minimal perihilar airspace opacities bilaterally.  No new pulmonary parenchymal abnormalities.  Left jugular central venous catheter tip projects over the lower SVC, unchanged.  IMPRESSION: Improving pulmonary edema, with residual perihilar airspace edema persisting.  No new abnormalities.   Original  Report Authenticated By: Hulan Saas, M.D.    Dg Chest Port 1 View  02/22/2012  *RADIOLOGY REPORT*  Clinical Data: Line placement.  PORTABLE CHEST - 1 VIEW  Comparison: 02/19/2012  Findings: Left central line has been placed with the tip in the upper right atrium.  No pneumothorax.  Cardiomegaly with vascular congestion and diffuse bilateral opacities, likely mild edema.  No effusions.  IMPRESSION: Left central line tip in the upright atrium.  No pneumothorax.  Mild bilateral airspace opacities, presumably mild edema.   Original Report Authenticated By: Charlett Nose, M.D.    Portable Chest 1 View  02/15/2012  *RADIOLOGY REPORT*  Clinical Data: CHF.  PORTABLE CHEST - 1 VIEW  Comparison: 02/14/2012  Findings: Shallow inspiration.  Cardiac enlargement with pulmonary vascular congestion and perihilar infiltration suggesting edema or pneumonia.  The infiltration is slightly more prominent than previous study.  No pneumothorax or effusion.  IMPRESSION: Cardiac enlargement with pulmonary vascular congestion.  Increasing perihilar infiltrates or edema bilaterally.   Original Report Authenticated By: Burman Nieves, M.D.    Dg Chest Portable 1 View  02/14/2012  *RADIOLOGY REPORT*  Clinical Data: Shortness of breath and diabetes.  Hypertension.  PORTABLE CHEST - 1 VIEW  Comparison: Two-view chest x-ray and CT of the chest 06/22/2011.  Findings: The heart is mildly enlarged.  Lung volumes are low. Mild interstitial and airspace disease is present bilaterally. The bone windows are unremarkable.  IMPRESSION:  1.  Mild cardiomegaly and edema is concerning for congestive heart failure. 2.  There is some airspace disease bilaterally which raises the possibility of infection as well.   Original Report Authenticated By: Cristal Deer  Alfredo Batty, M.D.    Dg Foot 2 Views Right  02/17/2012  *RADIOLOGY REPORT*  Clinical Data: Necrotic second toe right foot.  History of diabetes.  Staph bacteremia.  RIGHT FOOT - 2 VIEW   Comparison: None.  Findings: Underlying renal osteodystrophy with vascular calcifications.  Only AP and lateral radiographs performed; no oblique imaging.  Overlap of the digits on the lateral view. Question soft tissue ulcer about the lateral aspect of the second toe distally on AP view.  No gross osseous destruction.  No callus deposition.  IMPRESSION: Limited 2 view study, demonstrating no gross osseous destruction. Possible soft tissue defect about the second toe laterally.  If ongoing clinical concern, consider contrast enhanced MRI.   Original Report Authenticated By: Jeronimo Greaves, M.D.    Dg Swallowing Func-speech Pathology  02/16/2012  Chales Abrahams, CCC-SLP     02/16/2012 10:47 AM Objective Swallowing Evaluation: Modified Barium Swallowing Study   Patient Details  Name: Shandy Checo MRN: [id number] Date of Birth: 05/12/1940  Today's Date: 02/16/2012 Time: 0950-1015 SLP Time Calculation (min): 25 min  Past Medical History:  Past Medical History  Diagnosis Date  . Hypertension   . Diabetes mellitus   . Blindness   . TIA (transient ischemic attack)   . Anemia   . GERD (gastroesophageal reflux disease)   . Renal disorder   . Constipation    Past Surgical History:  Past Surgical History  Procedure Date  . Gallbladder surgery   . Esophageal dilation   . Cholecystectomy    HPI:  72 yo female adm to [facility] from [facility] SNF with  hypoxia, CXR + fluid, +/- pna- diagnosed with HCAP, ? CHF (await  BNP).  Per MD notes, pt told MD that she "always has pna."   PMH  + for TIA, GERD, anemia, constipation, HTN, blindness/HOH. SLP  phoned SNF SLP who reports she has not seen pt, she has however  seen the pt's roommate during meals and has not noted pt coughing  during meals.  Per SNF SLP, pt was recently "flagged for weight  loss" .  Esophageal dilatation completed in 03/1999 and pt states  she had problems swallowing before surgery that resolved after.    BSE completed Thursday pm and follow up Friday with   recommendation for MBS due to inability to rule out  dysphagia/aspiration at bedside with pt overtly coughing.     Assessment / Plan / Recommendation Clinical Impression  Dysphagia Diagnosis: Suspected primary esophageal dysphagia;Mild  oral phase dysphagia;Mild pharyngeal phase dysphagia   Clinical impression: Pt presents with mild oropharyngeal and  suspected primary esophageal dysphagia.  Decreased oral control  results in premature spillage of liquids into pharynx and  piecemeal deglutition with solids/pudding as well as oral stasis  with poor awareness.  Cued swallow facilitated timeliness of  swallow.  Pharyngeal dysphagia with delayed swallow initiation to  pyriform sinus with liquids, to vallecular space with  solid/pudding and  resultant trace silent penetration of liquids.   Cued throat clear removed trace penetrates but pt requiried  verbal cues x4 to complete and became agitated.  Pt prematurely  spills pudding/cracker filling entire vallecular space for longer  than 30 seconds as she continued to orally manipulate remainder  of bolus before she actually triggers a swallow.  No aspiration  or penetration with pudding/cracker noted.  Pt did not arrive to  test with her dentures unfortunately, therefore cracker was  softened.       Suspect esophageal deficits  significantly contribute to  oropharyngeal deficits.  Appearance of prominent cricopharyngeus  with retention of secretions x1, CP did not impact barium flow.   However pt did appear with stasis from distal to mid esophagus  with distal narrowing.  Above area of narrowed region, esophagus  appeared dilated (? Motility issues).  Retrograde propulsion of  barium occurred WITHOUT pt awareness.   Suspect pt's primary  dysphagia is esophageal.    Pt was instructed to compensatory strategies, testing findings  and recommendations during the procedure. Pt may benefit from GI  referral for esophageal dysphagia management (h/o esophageal  dilatation in 2001).   In the interim time, rec liquid diet in  small amounts for maximal airway protection and medications be  crushed given with liquids if not contraindicated.  SLP to  follow.  Thanks.       Treatment Recommendation  Therapy as outlined in treatment plan below    Diet Recommendation Thin liquid (All Liquids)   Liquid Administration via: Cup;Straw Medication Administration: Other (Comment) (crush with liquids) Supervision: Full supervision/cueing for compensatory strategies Compensations: Small sips/bites;Slow rate;Clear throat  intermittently Postural Changes and/or Swallow Maneuvers: Seated upright 90  degrees;Upright 30-60 min after meal    Other  Recommendations Recommended Consults: Consider GI  evaluation Oral Care Recommendations: Oral care BID   Follow Up Recommendations       Frequency and Duration min 2x/week  1 week   Pertinent Vitals/Pain     SLP Swallow Goals Patient will utilize recommended strategies during swallow to  increase swallowing safety with: Total assistance   General HPI: 72 yo female adm to [facility] from [facility]  SNF with hypoxia, CXR + fluid, +/- pna- diagnosed with HCAP, ?  CHF (await BNP).  Per MD notes, pt told MD that she "always has  pna."   PMH + for TIA, GERD, anemia, constipation, HTN,  blindness/HOH. SLP phoned SNF SLP who reports she has not seen  pt, she has however seen the pt's roommate during meals and has  not noted pt coughing during meals.  Per SNF SLP, pt was recently  "flagged for weight loss" .  Esophageal dilatation completed in  03/1999 and pt states she had problems swallowing before surgery  that resolved after.   BSE completed Thursday pm and follow up  Friday with recommendation for MBS due to inability to rule out  dysphagia/aspiration at bedside with pt overtly coughing. Type of Study: Modified Barium Swallowing Study Reason for Referral: Objectively evaluate swallowing function Diet Prior to this Study: Nectar-thick liquids;Regular Temperature Spikes  Noted: No Respiratory Status: Supplemental O2 delivered via (comment) Behavior/Cognition: Alert;Cooperative;Requires cueing;Decreased  sustained attention;Agitated (easily agitated when asked  questions) Oral Cavity - Dentition: Dentures, not available (dentures left  in pt's room) Oral Motor / Sensory Function: Impaired - see Bedside swallow  eval Self-Feeding Abilities: Needs assist Patient Positioning: Upright in chair Baseline Vocal Quality: Clear Volitional Swallow: Unable to elicit (pt states she didn't have  anything to swallow) Anatomy: Within functional limits Pharyngeal Secretions: Not observed secondary MBS    Reason for Referral Objectively evaluate swallowing function   Oral Phase Oral Preparation/Oral Phase Oral Phase: Impaired Oral - Nectar Oral - Nectar Teaspoon:  (decr oral control results in premature  spillage ) Oral - Nectar Cup:  (decr oral control results in premature  spillage) Oral - Thin Oral - Thin Teaspoon:  (decr oral control results in premature  spillage) Oral - Thin Cup:  (decr oral control results in  premature  spillage) Oral - Thin Straw:  (decr oral control results in premature  spillage) Oral - Solids Oral - Puree: Weak lingual manipulation;Delayed oral  transit;Piecemeal swallowing;Lingual/palatal residue Oral - Regular: Piecemeal swallowing;Weak lingual  manipulation;Delayed oral transit;Impaired  mastication;Lingual/palatal residue Oral Phase - Comment Oral Phase - Comment: decr oral control results in premature  spillage of all liquids, pt did not have dentures present for  exam- cracker was softened with thin barium and pudding   Pharyngeal Phase Pharyngeal Phase Pharyngeal Phase: Impaired Pharyngeal - Nectar Pharyngeal - Nectar Teaspoon: Premature spillage to valleculae Pharyngeal - Nectar Cup: Delayed swallow initiation;Premature  spillage to pyriform sinuses Pharyngeal - Thin Pharyngeal - Thin Teaspoon: Premature spillage to pyriform  sinuses;Penetration/Aspiration during  swallow Penetration/Aspiration details (thin teaspoon): Material enters  airway, remains ABOVE vocal cords then ejected out Pharyngeal - Thin Cup: Premature spillage to pyriform  sinuses;Penetration/Aspiration during swallow Penetration/Aspiration details (thin cup): Material enters  airway, CONTACTS cords and not ejected out (cued throat clear  removed trace amount of penetration) Pharyngeal - Thin Straw: Premature spillage to pyriform sinuses Pharyngeal - Solids Pharyngeal - Puree: Delayed swallow initiation;Premature spillage  to pyriform sinuses (retention of barium at vallecular space  before swlalow) Pharyngeal - Regular: Premature spillage to valleculae;Delayed  swallow initiation (retention of cracker at vallecular space  before swallow)  Cervical Esophageal Phase    GO    Cervical Esophageal Phase Cervical Esophageal Phase: Impaired Cervical Esophageal Phase - Comment Cervical Esophageal Comment: appearance of prominent  cricopharyngeus with retention of secretions x1, did not impact  barium flow, appearance of stasis with distal narrowing and  appearance of dilated esophagus above narrowed region, retrograde  propulsion of barium appeared WITHOUT pt awareness          Donavan Burnet, MS [facility] SLP [phone]           Subjective: Patient denies fevers, chills, chest pain, shortness breath, nausea, vomiting, diarrhea, dysuria, hematuria.  Objective: Filed Vitals:   02/23/12 0732 02/23/12 2040 02/23/12 2056 02/24/12 0600  BP:  126/65  114/50  Pulse:  73  73  Temp:  98 F (36.7 C)  98.9 F (37.2 C)  TempSrc:  Oral  Oral  Resp:  18  18  Height:      Weight:    95.1 kg (209 lb 10.5 oz)  SpO2: 91% 97% 93% 93%    Intake/Output Summary (Last 24 hours) at 02/24/12 1409 Last data filed at 02/24/12 1119  Gross per 24 hour  Intake   1546 ml  Output   1450 ml  Net     96 ml   Weight change: -0.7 kg (-1 lb 8.7 oz) Exam:   General:  Pt is alert, follows commands appropriately, not in acute  distress  HEENT: No icterus, No thrush, No neck mass, [place]/AT  Cardiovascular: RRR, S1/S2, no rubs, no gallops  Respiratory: CTA bilaterally, no wheezing, no crackles, no rhonchi  Abdomen: Soft/+BS, non tender, non distended, no guarding  Extremities: No lower extremity edema, No lymphangitis, No petechiae, No rashes, no synovitis; right foot surgical site without any erythema, bleeding, pus, tenderness to palpation  Data Reviewed: Basic Metabolic Panel:  Lab 02/24/12 4540 02/23/12 0500 02/22/12 0540 02/21/12 0609 02/20/12 0500  NA 134* 135 138 135 132*  K 3.9 3.8 3.5 3.7 3.7  CL 95* 98 99 99 95*  CO2 30 31 27 24 21   GLUCOSE 201* 201* 192* 194* 160*  BUN 40* 49* 58* 71* 76*  CREATININE 1.14*  1.28* 1.70* 2.28* 2.82*  CALCIUM 8.7 8.6 8.9 8.8 8.9  MG -- -- -- -- --  PHOS -- 3.1 3.3 4.1 5.0*   Liver Function Tests:  Lab 02/23/12 0500 02/22/12 0540 02/21/12 0609 02/20/12 0500  AST -- -- -- --  ALT -- -- -- --  ALKPHOS -- -- -- --  BILITOT -- -- -- --  PROT -- -- -- --  ALBUMIN 2.1* 2.2* 2.0* 2.0*   No results found for this basename: LIPASE:5,AMYLASE:5 in the last 168 hours No results found for this basename: AMMONIA:5 in the last 168 hours CBC:  Lab 02/24/12 0520 02/23/12 0500 02/22/12 0540 02/20/12 0710  WBC 19.2* 20.3* 15.8* 14.0*  NEUTROABS 14.7* 15.6* -- 10.5*  HGB 9.2* 9.3* 10.4* 9.1*  HCT 28.2* 29.3* 31.7* 28.1*  MCV 86.2 86.2 84.1 84.6  PLT 413* 419* 391 379   Cardiac Enzymes:  Lab 02/24/12 0520 02/20/12 0710  CKTOTAL 12 8  CKMB -- --  CKMBINDEX -- --  TROPONINI -- --   BNP: No components found with this basename: POCBNP:5 CBG:  Lab 02/24/12 1125 02/24/12 0835 02/23/12 2221 02/23/12 1757 02/23/12 1450  GLUCAP 182* 172* 190* 241* 232*    Recent Results (from the past 240 hour(s))  MRSA PCR SCREENING     Status: Normal   Collection Time   02/14/12  3:08 PM      Component Value Range Status Comment   MRSA by PCR NEGATIVE  NEGATIVE Final   CULTURE,  BLOOD (ROUTINE X 2)     Status: Normal   Collection Time   02/16/12  4:30 PM      Component Value Range Status Comment   Specimen Description BLOOD RIGHT HAND  3 ML IN [facility] BOTTLE   Final    Special Requests NONE   Final    Culture  Setup Time 02/16/2012 23:17   Final    Culture NO GROWTH 5 DAYS   Final    Report Status 02/22/2012 FINAL   Final   CULTURE, BLOOD (ROUTINE X 2)     Status: Normal   Collection Time   02/16/12  4:48 PM      Component Value Range Status Comment   Specimen Description BLOOD RIGHT ARM  2 ML IN [facility] BOTTLE   Final    Special Requests NONE   Final    Culture  Setup Time 02/16/2012 23:16   Final    Culture NO GROWTH 5 DAYS   Final    Report Status 02/22/2012 FINAL   Final   SURGICAL PCR SCREEN     Status: Normal   Collection Time   02/18/12  5:25 AM      Component Value Range Status Comment   MRSA, PCR NEGATIVE  NEGATIVE Final    Staphylococcus aureus NEGATIVE  NEGATIVE Final   URINE CULTURE     Status: Normal   Collection Time   02/19/12 10:40 AM      Component Value Range Status Comment   Specimen Description URINE, CATHETERIZED   Final    Special Requests NONE   Final    Culture  Setup Time 02/20/2012 01:28   Final    Colony Count NO GROWTH   Final    Culture NO GROWTH   Final    Report Status 02/20/2012 FINAL   Final      Scheduled Meds:   . albuterol  2.5 mg Nebulization BID  . antiseptic oral rinse  15 mL Mouth Rinse q12n4p  .  aspirin EC  325 mg Oral Daily  . brimonidine  1 drop Left Eye BID  . brinzolamide  1 drop Left Eye BID  . carvedilol  3.125 mg Oral Q breakfast  . chlorhexidine  15 mL Mouth/Throat BID  . DAPTOmycin (CUBICIN)  IV  600 mg Intravenous Q24H  . docusate sodium  100 mg Oral BID  . fluconazole  200 mg Oral Daily  . furosemide  40 mg Oral BID  . gabapentin  100 mg Oral QHS  . insulin aspart  0-15 Units Subcutaneous TID WC  . insulin aspart  0-5 Units Subcutaneous QHS  . insulin aspart  2 Units Subcutaneous TID WC  . insulin  glargine  10 Units Subcutaneous QHS  . ipratropium  0.5 mg Nebulization BID  . metoCLOPramide  5 mg Oral TID WC  . multivitamin with minerals  1 tablet Oral Daily  . sodium chloride  3 mL Intravenous Q12H  . sodium chloride  3 mL Intravenous Q12H   Continuous Infusions:    Joah Patlan, DO  [facility] Pager [phone]  If 7PM-7AM, please contact night-coverage [email or web address] Password [email or web address] 02/24/2012, 2:09 PM   LOS: 10 days

## 2012-02-25 ENCOUNTER — Encounter (HOSPITAL_COMMUNITY)
Admission: EM | Disposition: A | Discharge status: Skilled Nursing Facility | Payer: Self-pay | Source: Home / Self Care | Attending: Internal Medicine

## 2012-02-25 ENCOUNTER — Encounter (HOSPITAL_COMMUNITY): Payer: Self-pay | Admitting: *Deleted

## 2012-02-25 DIAGNOSIS — N289 Disorder of kidney and ureter, unspecified: Secondary | ICD-10-CM

## 2012-02-25 DIAGNOSIS — R7881 Bacteremia: Secondary | ICD-10-CM

## 2012-02-25 DIAGNOSIS — A4901 Methicillin susceptible Staphylococcus aureus infection, unspecified site: Secondary | ICD-10-CM

## 2012-02-25 HISTORY — PX: TEE WITHOUT CARDIOVERSION: SHX5443

## 2012-02-25 LAB — CBC
MCHC: 31.2 g/dL (ref 30.0–36.0)
Platelets: 363 10*3/uL (ref 150–400)
RDW: 16.6 % — ABNORMAL HIGH (ref 11.5–15.5)
WBC: 17.5 10*3/uL — ABNORMAL HIGH (ref 4.0–10.5)

## 2012-02-25 LAB — BASIC METABOLIC PANEL
BUN: 40 mg/dL — ABNORMAL HIGH (ref 6–23)
Chloride: 95 mEq/L — ABNORMAL LOW (ref 96–112)
GFR calc Af Amer: 55 mL/min — ABNORMAL LOW (ref >90)
GFR calc non Af Amer: 47 mL/min — ABNORMAL LOW (ref >90)
Potassium: 3.8 mEq/L (ref 3.5–5.1)
Sodium: 132 mEq/L — ABNORMAL LOW (ref 135–145)

## 2012-02-25 LAB — URINE CULTURE
Colony Count: NO GROWTH
Culture: NO GROWTH

## 2012-02-25 LAB — GLUCOSE, CAPILLARY
Glucose-Capillary: 161 mg/dL — ABNORMAL HIGH (ref 70–99)
Glucose-Capillary: 138 mg/dL — ABNORMAL HIGH (ref 70–99)
Glucose-Capillary: 157 mg/dL — ABNORMAL HIGH (ref 70–99)

## 2012-02-25 SURGERY — ECHOCARDIOGRAM, TRANSESOPHAGEAL
Anesthesia: Moderate Sedation

## 2012-02-25 MED ORDER — FENTANYL CITRATE 0.05 MG/ML IJ SOLN
INTRAMUSCULAR | Status: DC | PRN
  Administered 2012-02-25 (×3): 25 ug via INTRAVENOUS

## 2012-02-25 MED ORDER — BUTAMBEN-TETRACAINE-BENZOCAINE 2-2-14 % EX AERO
INHALATION_SPRAY | CUTANEOUS | Status: DC | PRN
  Administered 2012-02-25: 2 via TOPICAL

## 2012-02-25 MED ORDER — SODIUM CHLORIDE 0.9 % IV SOLN: INTRAVENOUS | Status: DC

## 2012-02-25 NOTE — Progress Notes (Signed)
  Echocardiogram Echocardiogram Transesophageal has been performed.  [name] 02/25/2012, 11:38 AM

## 2012-02-25 NOTE — Progress Notes (Signed)
SLP Cancellation Note  Patient Details Name: [name] MRN: [id number] DOB: May 07, 1940   Cancelled treatment:       Reason Eval/Treat Not Completed: Patient at procedure or test/unavailable (pt at [facility] undergoing TEE)  Donavan Burnet, MS [facility] SLP [phone]

## 2012-02-25 NOTE — CV Procedure (Signed)
INDICATIONS:   The patient is 72 years old female with sepsis.  PROCEDURE:  Informed consent was discussed including risks, benefits and alternatives for the procedure.  Risks include, but are not limited to, cough, sore throat, vomiting, nausea, somnolence, esophageal and stomach trauma or perforation, bleeding, low blood pressure, aspiration, pneumonia, infection, trauma to the teeth and death.    Patient was given sedation.  The oropharynx was anesthetized with topical lidocaine.  The transesophageal probe was inserted in the esophagus and stomach and multiple views were obtained.  Agitated saline was used after the transesophageal probe was removed from the body.  The patient was kept under observation until the patient left the procedure room.  The patient left the procedure room in stable condition.   COMPLICATIONS:  There were no immediate complications.  FINDINGS:  1. LEFT VENTRICLE: The left ventricle showed mild LVH and preserved systolic function.  Wall motion is normal.  No thrombus or masses seen in the left ventricle.  2. RIGHT VENTRICLE:  The right ventricle is normal in structure and function without any thrombus or masses. Prominent moderator band.   3. LEFT ATRIUM:  The left atrium is mildly enlarged and without any thrombus or masses.  4. LEFT ATRIAL APPENDAGE:  The left atrial appendage is free of any thrombus or masses.  5. RIGHT ATRIUM:  The right atrium is free of any thrombus or masses.    6. ATRIAL SEPTUM:  The atrial septum showed PFO on provocation with agitated saline injection.  7. MITRAL VALVE:  The mitral valve has thickening and calcification but  Has normal function without  masses, stenosis or gross vegetations. Mild mitral regurgitation was seen. Occasional tiny strand like mobile shadow seen on anterior leaflet.  8. TRICUSPID VALVE:  The tricuspid valve is normal in structure and function with mild to moderate regurgitation and without masses, stenosis or  vegetations.  9. AORTIC VALVE:  The aortic valve is normal in structure and function with trace regurgitation and no masses, stenosis or gross vegetations. Rare tiny strand like mobile shadow seen in outflow tract area.   10. PULMONIC VALVE:  The pulmonic valve is normal in structure and function with mild regurgitation and no masses, stenosis or vegetations.  11. AORTIC ARCH, ASCENDING AND DESCENDING AORTA:  The aorta had mild to moderate diffuse atherosclerosis in the ascending or descending aorta.  The aortic arch was normal.  IMPRESSION:   1.No gross vegetations seen. (Very small strand seen over anterior leaflet of MV and out flow tract of LV- ? Early vegetation)  2.Mild MR, TR, PI and trace AI 3. Moderate aortic atherosclerosis. 4. PFO  RECOMMENDATIONS:    Medical treatment for presumed vegetation.

## 2012-02-25 NOTE — Progress Notes (Addendum)
[facility] PROGRESS NOTE  [name] ZOX:[id number] DOB: 03-24-1940 DOA: 02/14/2012 PCP: No primary provider on file.  Assessment/Plan:  MRSA bacteremia  -TEE on 02/24/12--"Occasional tiny strand like mobile shadow seen on anterior leaflet" -may need to extend IV duration to 6-8wks--await final ID recs -TEE 02/21/12--patient had respiratory arrest and TEE was canceled--> repeat TTE performed and was negative for vegetation  -no clear source except necrotic right 2nd toe which was amputated on 1/20. TTEshows no vegetations.  -vancomycin. ( currently held for supratherapeutic levels)  -Has a necrotic right second toe. MRI of the foot showed osteomyelitis. Seen by orthopedic consult and had amputation of the right second toe done on 1/20 and tolerated procedure well.  -Started patient on Cubicin once vancomycin level subtherapeutic  -Baseline CPK 8  -Anticipated stop date 02/015/2014 unless TEE is neg for vegetation  -Cubicin started 02/24/2012  Leukocytosis  -Remains afebrile and hemodynamically and clinically stable.  -WBC trending down again -Stool for C. difficile PCR--neg  -Repeat UA and urine culture --nondiagnostic--D/C fluconazole -D/C foley -Blood cultures x2 sets(02/23/12)-negative for 48 hours  -Chest x-ray repeated 02/24/2012 showed improving interstitial edema   Acute osteomyelitis right second toe  -Status post amputation 02/18/2012  -patient has follow up appt w/Dr. Montez Morita 02/29/12 Acute on Chronic kidney disease  -improved, stable -Serum creatinine has improved with diuresis  - neg 5800cc with intravenous furosemide  -appreciate renal  -continue furosemide 40mg  po bid, follow BMP   Acute CHF--diastolic  -Ejection fraction 55-60%  -Clinically remains hypervolemic  -Continue furosemide as discussed po  -Maintain negative fluid balance (+236cc last 24 hrs)  Diabetes mellitus  Continue sliding scale insulin and Lantus.  -CBGs rising with better po intake    -Add NovoLog 2 units pre-meal  Hypertension  Currently stable. Continue home medications-coreg   IV access--PICC not good option due to vein sclerosis for future HD  -triple lumen   Disposition Plan: back to SNF 02/26/12 if stable     Consultants:  [facility]  ID  Orthopedics  [place] [facility] Procedures:  Modified barium swallow ( 1/18)  Right second toe amputation on 1/20  Standard barium swallow ordered  Triple lumen placed 02/22/12            Procedures/Studies: Dg Chest 2 View  02/19/2012  *RADIOLOGY REPORT*  Clinical Data: Weakness.  Evaluate for pulmonary edema  CHEST - 2 VIEW  Comparison: None 02/17/2012  Findings: This examination is technically limited by body habitus and patient positioning (semiupright) Cardiomegaly is stable. There is bilateral pulmonary vascular congestion.  There is a patchy left retrocardiac opacity.  Hazy opacity the right lung base may be due to overlying soft tissues of the breast. On the lateral view, the posterior costophrenic angles are obscured by the table, as the patient was in semiupright position.  IMPRESSION: Cardiomegaly and mild pulmonary vascular congestion. Image detail limited by patient body habitus and positioning. Small left basilar opacity could be due to atelectasis or airspace disease. Small bilateral pleural effusions cannot be excluded, as described above.   Original Report Authenticated By: Britta Mccreedy, M.D.    Dg Chest 2 View  02/17/2012  *RADIOLOGY REPORT*  Clinical Data: Increased cough and shortness of breath.  Question fluid overload.  CHEST - 2 VIEW  Comparison: Chest radiograph 02/15/2012.  Findings: Stable cardiomegaly.  There is diffuse pulmonary vascular congestion with interstitial prominence in the perihilar regions of lung bases.  Patchy airspace opacities in the left mid lung.  Small bilateral pleural effusions.  Thickening of the fissures on the lateral view.  IMPRESSION: Mild congestive heart failure pattern  with small bilateral pleural effusions.   Original Report Authenticated By: Britta Mccreedy, M.D.    Ct Head Wo Contrast  02/14/2012  *RADIOLOGY REPORT*  Clinical Data: Altered mental status.  History of weakness and TIA.  CT HEAD WITHOUT CONTRAST  Technique:  Contiguous axial images were obtained from the base of the skull through the vertex without contrast.  Comparison: 12/25/2008  Findings: Bone windows demonstrate similar abnormal appearance of the right globe, presumably related to prior trauma or hemorrhage. There are also similar calcifications about the left globe.  Mucosal thickening of the sphenoid sinus and ethmoid air cells.  A left mastoid effusion is again identified.  Soft tissue windows demonstrate similar moderate hydrocephalus, including the fourth, third, and lateral ventricles.  Slight increase in periventricular white matter hypoattenuation.  Redemonstration of right frontal hypoattenuation which is felt to be similar, including on image 20/series 2.  No mass lesion, hemorrhage, intra-axial, or extra-axial fluid collection.  IMPRESSION:  1.  Similar hydrocephalus. 2.  Slight increase in periventricular white matter hypoattenuation.  Favor small vessel ischemic change.  A component of transependymal CSF resorption could look similar. 3.  Right frontal lobe remote infarct. 4.  Sinus disease with a left mastoid effusion.   Original Report Authenticated By: Jeronimo Greaves, M.D.    Dg Esophagus  02/19/2012  *RADIOLOGY REPORT*  Clinical Data: Dilated esophagus on modified barium swallow.  The patient reports no dysphagia or difficulty edema drinking.  ESOPHOGRAM/BARIUM SWALLOW  Technique:  Single contrast examination was performed using thin barium.  Fluoroscopy time:  2.3 minutes.  Comparison:  None.  Findings:  Exam is somewhat limited due to patient immobility. Patient had a toe amputation 1 day prior.  The esophagus is patulous.  There is significant esophageal dysmotility with tertiary  contractions and to-and-fro motion of the barium bolus.  There is some mild irregularity at the GE junction with mild narrowing.  A 13 mm barium tablet did pass GE junction easily.  IMPRESSION:  1.  Significant esophageal dysmotility with tertiary contractions and to-and-fro motion of the barium bolus.  Patulous esophagus.  2.  Some mild stricturing of the distal esophagus.  Cannot exclude a mucosal irregularity. 3.  Barium tablet passed through the GE junction easily.   Original Report Authenticated By: Genevive Bi, M.D.    US Renal  02/17/2012  *RADIOLOGY REPORT*  Clinical Data: Acute renal insufficiency with decreased urine output.  RENAL/URINARY TRACT ULTRASOUND COMPLETE  Comparison:  CT 06/26/2011  Findings:  Right Kidney:  12.9 cm. No hydronephrosis.  Small renal calculi identified. Normal renal cortical thickness and echogenicity.  Left Kidney:  11.9 cm. No hydronephrosis.  Small renal calculi identified. Normal renal cortical thickness and echogenicity.  Bladder:  Collapsed around a Foley catheter.  IMPRESSION: No hydronephrosis.  Renal collecting system calculi, as before.   Original Report Authenticated By: Jeronimo Greaves, M.D.    Mr Foot Right Wo Contrast  02/17/2012  *RADIOLOGY REPORT*  Clinical Data: Necrotic second toe.  MRI OF THE RIGHT FOREFOOT WITHOUT CONTRAST  Technique:  Multiplanar, multisequence MR imaging was performed. No intravenous contrast was administered.  Comparison: Radiographs 02/17/2012.  Findings: Abnormal signal intensity in the distal phalanx of the second toe suspicious for osteomyelitis.  No findings to suggest septic arthritis.  There is mild diffuse cellulitis and diffuse myofasciitis.  No focal soft tissue abscess.  IMPRESSION:  1.  Osteomyelitis involving the distal phalanx  of the second toe. 2.  Cellulitis and myofasciitis without focal soft tissue abscess.   Original Report Authenticated By: Rudie Meyer, M.D.    Dg Chest Port 1 View  02/23/2012  *RADIOLOGY  REPORT*  Clinical Data: Shortness of breath.  Leukocytosis.  Follow up CHF/pulmonary edema.  PORTABLE CHEST - 1 VIEW 02/23/2012 1046 hours:  Comparison: Portable chest x-ray yesterday.  Two-view chest x-ray 02/19/2012, 02/17/2012.  Findings: Cardiac silhouette enlarged but stable.  Interval improvement in the interstitial and airspace pulmonary edema since yesterday, with residual minimal perihilar airspace opacities bilaterally.  No new pulmonary parenchymal abnormalities.  Left jugular central venous catheter tip projects over the lower SVC, unchanged.  IMPRESSION: Improving pulmonary edema, with residual perihilar airspace edema persisting.  No new abnormalities.   Original Report Authenticated By: Hulan Saas, M.D.    Dg Chest Port 1 View  02/22/2012  *RADIOLOGY REPORT*  Clinical Data: Line placement.  PORTABLE CHEST - 1 VIEW  Comparison: 02/19/2012  Findings: Left central line has been placed with the tip in the upper right atrium.  No pneumothorax.  Cardiomegaly with vascular congestion and diffuse bilateral opacities, likely mild edema.  No effusions.  IMPRESSION: Left central line tip in the upright atrium.  No pneumothorax.  Mild bilateral airspace opacities, presumably mild edema.   Original Report Authenticated By: Charlett Nose, M.D.    Portable Chest 1 View  02/15/2012  *RADIOLOGY REPORT*  Clinical Data: CHF.  PORTABLE CHEST - 1 VIEW  Comparison: 02/14/2012  Findings: Shallow inspiration.  Cardiac enlargement with pulmonary vascular congestion and perihilar infiltration suggesting edema or pneumonia.  The infiltration is slightly more prominent than previous study.  No pneumothorax or effusion.  IMPRESSION: Cardiac enlargement with pulmonary vascular congestion.  Increasing perihilar infiltrates or edema bilaterally.   Original Report Authenticated By: Burman Nieves, M.D.    Dg Chest Portable 1 View  02/14/2012  *RADIOLOGY REPORT*  Clinical Data: Shortness of breath and diabetes.   Hypertension.  PORTABLE CHEST - 1 VIEW  Comparison: Two-view chest x-ray and CT of the chest 06/22/2011.  Findings: The heart is mildly enlarged.  Lung volumes are low. Mild interstitial and airspace disease is present bilaterally. The bone windows are unremarkable.  IMPRESSION:  1.  Mild cardiomegaly and edema is concerning for congestive heart failure. 2.  There is some airspace disease bilaterally which raises the possibility of infection as well.   Original Report Authenticated By: Marin Roberts, M.D.    Dg Foot 2 Views Right  02/17/2012  *RADIOLOGY REPORT*  Clinical Data: Necrotic second toe right foot.  History of diabetes.  Staph bacteremia.  RIGHT FOOT - 2 VIEW  Comparison: None.  Findings: Underlying renal osteodystrophy with vascular calcifications.  Only AP and lateral radiographs performed; no oblique imaging.  Overlap of the digits on the lateral view. Question soft tissue ulcer about the lateral aspect of the second toe distally on AP view.  No gross osseous destruction.  No callus deposition.  IMPRESSION: Limited 2 view study, demonstrating no gross osseous destruction. Possible soft tissue defect about the second toe laterally.  If ongoing clinical concern, consider contrast enhanced MRI.   Original Report Authenticated By: Jeronimo Greaves, M.D.    Dg Swallowing Func-speech Pathology  02/16/2012  Chales Abrahams, CCC-SLP     02/16/2012 10:47 AM Objective Swallowing Evaluation: Modified Barium Swallowing Study   Patient Details  Name: [name] MRN: [id number] Date of Birth: 1940/06/13  Today's Date: 02/16/2012 Time: 0950-1015 SLP Time Calculation (min): 25 min  Past Medical History:  Past Medical History  Diagnosis Date  . Hypertension   . Diabetes mellitus   . Blindness   . TIA (transient ischemic attack)   . Anemia   . GERD (gastroesophageal reflux disease)   . Renal disorder   . Constipation    Past Surgical History:  Past Surgical History  Procedure Date  . Gallbladder surgery   .  Esophageal dilation   . Cholecystectomy    HPI:  72 yo female adm to [facility] from [facility] with  hypoxia, CXR + fluid, +/- pna- diagnosed with HCAP, ? CHF (await  BNP).  Per MD notes, pt told MD that she "always has pna."   PMH  + for TIA, GERD, anemia, constipation, HTN, blindness/HOH. SLP  phoned SNF SLP who reports she has not seen pt, she has however  seen the pt's roommate during meals and has not noted pt coughing  during meals.  Per SNF SLP, pt was recently "flagged for weight  loss" .  Esophageal dilatation completed in 03/1999 and pt states  she had problems swallowing before surgery that resolved after.    BSE completed Thursday pm and follow up Friday with  recommendation for MBS due to inability to rule out  dysphagia/aspiration at bedside with pt overtly coughing.     Assessment / Plan / Recommendation Clinical Impression  Dysphagia Diagnosis: Suspected primary esophageal dysphagia;Mild  oral phase dysphagia;Mild pharyngeal phase dysphagia   Clinical impression: Pt presents with mild oropharyngeal and  suspected primary esophageal dysphagia.  Decreased oral control  results in premature spillage of liquids into pharynx and  piecemeal deglutition with solids/pudding as well as oral stasis  with poor awareness.  Cued swallow facilitated timeliness of  swallow.  Pharyngeal dysphagia with delayed swallow initiation to  pyriform sinus with liquids, to vallecular space with  solid/pudding and  resultant trace silent penetration of liquids.   Cued throat clear removed trace penetrates but pt requiried  verbal cues x4 to complete and became agitated.  Pt prematurely  spills pudding/cracker filling entire vallecular space for longer  than 30 seconds as she continued to orally manipulate remainder  of bolus before she actually triggers a swallow.  No aspiration  or penetration with pudding/cracker noted.  Pt did not arrive to  test with her dentures unfortunately, therefore cracker was  softened.        Suspect esophageal deficits significantly contribute to  oropharyngeal deficits.  Appearance of prominent cricopharyngeus  with retention of secretions x1, CP did not impact barium flow.   However pt did appear with stasis from distal to mid esophagus  with distal narrowing.  Above area of narrowed region, esophagus  appeared dilated (? Motility issues).  Retrograde propulsion of  barium occurred WITHOUT pt awareness.   Suspect pt's primary  dysphagia is esophageal.    Pt was instructed to compensatory strategies, testing findings  and recommendations during the procedure. Pt may benefit from GI  referral for esophageal dysphagia management (h/o esophageal  dilatation in 2001).  In the interim time, rec liquid diet in  small amounts for maximal airway protection and medications be  crushed given with liquids if not contraindicated.  SLP to  follow.  Thanks.       Treatment Recommendation  Therapy as outlined in treatment plan below    Diet Recommendation Thin liquid (All Liquids)   Liquid Administration via: Cup;Straw Medication Administration: Other (Comment) (crush with liquids) Supervision: Full supervision/cueing for compensatory strategies  Compensations: Small sips/bites;Slow rate;Clear throat  intermittently Postural Changes and/or Swallow Maneuvers: Seated upright 90  degrees;Upright 30-60 min after meal    Other  Recommendations Recommended Consults: Consider GI  evaluation Oral Care Recommendations: Oral care BID   Follow Up Recommendations       Frequency and Duration min 2x/week  1 week   Pertinent Vitals/Pain     SLP Swallow Goals Patient will utilize recommended strategies during swallow to  increase swallowing safety with: Total assistance   General HPI: 72 yo female adm to [facility] from [facility]  SNF with hypoxia, CXR + fluid, +/- pna- diagnosed with HCAP, ?  CHF (await BNP).  Per MD notes, pt told MD that she "always has  pna."   PMH + for TIA, GERD, anemia, constipation, HTN,   blindness/HOH. SLP phoned SNF SLP who reports she has not seen  pt, she has however seen the pt's roommate during meals and has  not noted pt coughing during meals.  Per SNF SLP, pt was recently  "flagged for weight loss" .  Esophageal dilatation completed in  03/1999 and pt states she had problems swallowing before surgery  that resolved after.   BSE completed Thursday pm and follow up  Friday with recommendation for MBS due to inability to rule out  dysphagia/aspiration at bedside with pt overtly coughing. Type of Study: Modified Barium Swallowing Study Reason for Referral: Objectively evaluate swallowing function Diet Prior to this Study: Nectar-thick liquids;Regular Temperature Spikes Noted: No Respiratory Status: Supplemental O2 delivered via (comment) Behavior/Cognition: Alert;Cooperative;Requires cueing;Decreased  sustained attention;Agitated (easily agitated when asked  questions) Oral Cavity - Dentition: Dentures, not available (dentures left  in pt's room) Oral Motor / Sensory Function: Impaired - see Bedside swallow  eval Self-Feeding Abilities: Needs assist Patient Positioning: Upright in chair Baseline Vocal Quality: Clear Volitional Swallow: Unable to elicit (pt states she didn't have  anything to swallow) Anatomy: Within functional limits Pharyngeal Secretions: Not observed secondary MBS    Reason for Referral Objectively evaluate swallowing function   Oral Phase Oral Preparation/Oral Phase Oral Phase: Impaired Oral - Nectar Oral - Nectar Teaspoon:  (decr oral control results in premature  spillage ) Oral - Nectar Cup:  (decr oral control results in premature  spillage) Oral - Thin Oral - Thin Teaspoon:  (decr oral control results in premature  spillage) Oral - Thin Cup:  (decr oral control results in premature  spillage) Oral - Thin Straw:  (decr oral control results in premature  spillage) Oral - Solids Oral - Puree: Weak lingual manipulation;Delayed oral  transit;Piecemeal swallowing;Lingual/palatal  residue Oral - Regular: Piecemeal swallowing;Weak lingual  manipulation;Delayed oral transit;Impaired  mastication;Lingual/palatal residue Oral Phase - Comment Oral Phase - Comment: decr oral control results in premature  spillage of all liquids, pt did not have dentures present for  exam- cracker was softened with thin barium and pudding   Pharyngeal Phase Pharyngeal Phase Pharyngeal Phase: Impaired Pharyngeal - Nectar Pharyngeal - Nectar Teaspoon: Premature spillage to valleculae Pharyngeal - Nectar Cup: Delayed swallow initiation;Premature  spillage to pyriform sinuses Pharyngeal - Thin Pharyngeal - Thin Teaspoon: Premature spillage to pyriform  sinuses;Penetration/Aspiration during swallow Penetration/Aspiration details (thin teaspoon): Material enters  airway, remains ABOVE vocal cords then ejected out Pharyngeal - Thin Cup: Premature spillage to pyriform  sinuses;Penetration/Aspiration during swallow Penetration/Aspiration details (thin cup): Material enters  airway, CONTACTS cords and not ejected out (cued throat clear  removed trace amount of penetration) Pharyngeal - Thin Straw: Premature spillage to pyriform  sinuses Pharyngeal - Solids Pharyngeal - Puree: Delayed swallow initiation;Premature spillage  to pyriform sinuses (retention of barium at vallecular space  before swlalow) Pharyngeal - Regular: Premature spillage to valleculae;Delayed  swallow initiation (retention of cracker at vallecular space  before swallow)  Cervical Esophageal Phase    GO    Cervical Esophageal Phase Cervical Esophageal Phase: Impaired Cervical Esophageal Phase - Comment Cervical Esophageal Comment: appearance of prominent  cricopharyngeus with retention of secretions x1, did not impact  barium flow, appearance of stasis with distal narrowing and  appearance of dilated esophagus above narrowed region, retrograde  propulsion of barium appeared WITHOUT pt awareness          Donavan Burnet, MS [facility] SLP [phone]            Subjective: Patient denies fevers, chills, chest pain, shortness breath, nausea, vomiting, diarrhea, abdominal pain. No headache. No dizziness. No rashes or synovitis.  Objective: Filed Vitals:   02/25/12 1100 02/25/12 1109 02/25/12 1446 02/25/12 1602  BP: 165/81 147/72 146/54   Pulse:  65 66   Temp:  98.1 F (36.7 C) 98.4 F (36.9 C)   TempSrc:  Oral Oral   Resp: 11  16   Height:      Weight:      SpO2: 99% 98% 100% 100%    Intake/Output Summary (Last 24 hours) at 02/25/12 1712 Last data filed at 02/25/12 1450  Gross per 24 hour  Intake    580 ml  Output    850 ml  Net   -270 ml   Weight change: 2.4 kg (5 lb 4.7 oz) Exam:   General:  Pt is alert, follows commands appropriately, not in acute distress  HEENT: No icterus, No thrush, [place]/AT  Cardiovascular: RRR, S1/S2, no rubs, no gallops  Respiratory: CTA bilaterally, no wheezing, no crackles, no rhonchi  Abdomen: Soft/+BS, non tender, non distended, no guarding  Extremities: trace edema, No lymphangitis, No petechiae, No rashes, no synovitis  Data Reviewed: Basic Metabolic Panel:  Lab 02/25/12 3086 02/24/12 0520 02/23/12 0500 02/22/12 0540 02/21/12 0609 02/20/12 0500  NA 132* 134* 135 138 135 --  K 3.8 3.9 3.8 3.5 3.7 --  CL 95* 95* 98 99 99 --  CO2 28 30 31 27 24  --  GLUCOSE 179* 201* 201* 192* 194* --  BUN 40* 40* 49* 58* 71* --  CREATININE 1.15* 1.14* 1.28* 1.70* 2.28* --  CALCIUM 8.7 8.7 8.6 8.9 8.8 --  MG -- -- -- -- -- --  PHOS -- -- 3.1 3.3 4.1 5.0*   Liver Function Tests:  Lab 02/23/12 0500 02/22/12 0540 02/21/12 0609 02/20/12 0500  AST -- -- -- --  ALT -- -- -- --  ALKPHOS -- -- -- --  BILITOT -- -- -- --  PROT -- -- -- --  ALBUMIN 2.1* 2.2* 2.0* 2.0*   No results found for this basename: LIPASE:5,AMYLASE:5 in the last 168 hours No results found for this basename: AMMONIA:5 in the last 168 hours CBC:  Lab 02/25/12 0600 02/24/12 0520 02/23/12 0500 02/22/12 0540 02/20/12 0710   WBC 17.5* 19.2* 20.3* 15.8* 14.0*  NEUTROABS -- 14.7* 15.6* -- 10.5*  HGB 8.7* 9.2* 9.3* 10.4* 9.1*  HCT 27.9* 28.2* 29.3* 31.7* 28.1*  MCV 86.9 86.2 86.2 84.1 84.6  PLT 363 413* 419* 391 379   Cardiac Enzymes:  Lab 02/24/12 0520 02/20/12 0710  CKTOTAL 12 8  CKMB -- --  CKMBINDEX -- --  TROPONINI -- --   BNP:  No components found with this basename: POCBNP:5 CBG:  Lab 02/25/12 1230 02/25/12 0743 02/24/12 2142 02/24/12 1652 02/24/12 1125  GLUCAP 138* 161* 179* 237* 182*    Recent Results (from the past 240 hour(s))  CULTURE, BLOOD (ROUTINE X 2)     Status: Normal   Collection Time   02/16/12  4:30 PM      Component Value Range Status Comment   Specimen Description BLOOD RIGHT HAND  3 ML IN [facility] BOTTLE   Final    Special Requests NONE   Final    Culture  Setup Time 02/16/2012 23:17   Final    Culture NO GROWTH 5 DAYS   Final    Report Status 02/22/2012 FINAL   Final   CULTURE, BLOOD (ROUTINE X 2)     Status: Normal   Collection Time   02/16/12  4:48 PM      Component Value Range Status Comment   Specimen Description BLOOD RIGHT ARM  2 ML IN [facility] BOTTLE   Final    Special Requests NONE   Final    Culture  Setup Time 02/16/2012 23:16   Final    Culture NO GROWTH 5 DAYS   Final    Report Status 02/22/2012 FINAL   Final   SURGICAL PCR SCREEN     Status: Normal   Collection Time   02/18/12  5:25 AM      Component Value Range Status Comment   MRSA, PCR NEGATIVE  NEGATIVE Final    Staphylococcus aureus NEGATIVE  NEGATIVE Final   URINE CULTURE     Status: Normal   Collection Time   02/19/12 10:40 AM      Component Value Range Status Comment   Specimen Description URINE, CATHETERIZED   Final    Special Requests NONE   Final    Culture  Setup Time 02/20/2012 01:28   Final    Colony Count NO GROWTH   Final    Culture NO GROWTH   Final    Report Status 02/20/2012 FINAL   Final   CULTURE, BLOOD (ROUTINE X 2)     Status: Normal (Preliminary result)   Collection Time   02/23/12  10:58 AM      Component Value Range Status Comment   Specimen Description BLOOD LEFT ARM   Final    Special Requests A   Final    Culture  Setup Time 02/23/2012 22:40   Final    Culture     Final    Value:        BLOOD CULTURE RECEIVED NO GROWTH TO DATE CULTURE WILL BE HELD FOR 5 DAYS BEFORE ISSUING A FINAL NEGATIVE REPORT   Report Status PENDING   Incomplete   CULTURE, BLOOD (ROUTINE X 2)     Status: Normal (Preliminary result)   Collection Time   02/23/12 11:07 AM      Component Value Range Status Comment   Specimen Description BLOOD LEFT HAND   Final    Special Requests BOTTLES DRAWN AEROBIC ONLY 1CC   Final    Culture  Setup Time 02/23/2012 22:40   Final    Culture     Final    Value:        BLOOD CULTURE RECEIVED NO GROWTH TO DATE CULTURE WILL BE HELD FOR 5 DAYS BEFORE ISSUING A FINAL NEGATIVE REPORT   Report Status PENDING   Incomplete   URINE CULTURE     Status: Normal   Collection Time  02/23/12  3:44 PM      Component Value Range Status Comment   Specimen Description URINE, CLEAN CATCH   Final    Special Requests NONE   Final    Culture  Setup Time 02/24/2012 03:06   Final    Colony Count NO GROWTH   Final    Culture NO GROWTH   Final    Report Status 02/25/2012 FINAL   Final   CLOSTRIDIUM DIFFICILE BY PCR     Status: Normal   Collection Time   02/24/12 10:52 AM      Component Value Range Status Comment   C difficile by pcr NEGATIVE  NEGATIVE Final      Scheduled Meds:   . albuterol  2.5 mg Nebulization BID  . antiseptic oral rinse  15 mL Mouth Rinse q12n4p  . aspirin EC  325 mg Oral Daily  . brimonidine  1 drop Left Eye BID  . brinzolamide  1 drop Left Eye BID  . carvedilol  3.125 mg Oral Q breakfast  . chlorhexidine  15 mL Mouth/Throat BID  . DAPTOmycin (CUBICIN)  IV  600 mg Intravenous Q24H  . docusate sodium  100 mg Oral BID  . furosemide  40 mg Oral BID  . gabapentin  100 mg Oral QHS  . insulin aspart  0-15 Units Subcutaneous TID WC  . insulin aspart  0-5  Units Subcutaneous QHS  . insulin aspart  2 Units Subcutaneous TID WC  . insulin glargine  10 Units Subcutaneous QHS  . ipratropium  0.5 mg Nebulization BID  . metoCLOPramide  5 mg Oral TID WC  . multivitamin with minerals  1 tablet Oral Daily  . sodium chloride  3 mL Intravenous Q12H  . sodium chloride  3 mL Intravenous Q12H   Continuous Infusions:   . sodium chloride       Ashlley Booher, DO  [facility] Pager [phone]  If 7PM-7AM, please contact night-coverage [email or web address] Password [email or web address] 02/25/2012, 5:12 PM   LOS: 11 days

## 2012-02-25 NOTE — Progress Notes (Signed)
[facility]  Date of Admission:  02/14/2012  Antibiotics: Vancomycin 8 days  vanco trough 1/22 30.3  Subjective: Patient getting TEE now  Objective: Temp:  [98.4 F (36.9 C)-99 F (37.2 C)] 99 F (37.2 C) (01/27 0515) Pulse Rate:  [67-76] 69  (01/27 0938) Resp:  [12-88] 13  (01/27 1045) BP: (121-166)/(48-116) 156/69 mmHg (01/27 1045) SpO2:  [85 %-100 %] 98 % (01/27 1045) Weight:  [214 lb 15.2 oz (97.5 kg)] 214 lb 15.2 oz (97.5 kg) (01/27 0515)    Lab Results Lab Results  Component Value Date   WBC 17.5* 02/25/2012   HGB 8.7* 02/25/2012   HCT 27.9* 02/25/2012   MCV 86.9 02/25/2012   PLT 363 02/25/2012    Lab Results  Component Value Date   CREATININE 1.15* 02/25/2012   BUN 40* 02/25/2012   NA 132* 02/25/2012   K 3.8 02/25/2012   CL 95* 02/25/2012   CO2 28 02/25/2012    Lab Results  Component Value Date   ALT 24 02/14/2012   AST 40* 02/14/2012   ALKPHOS 174* 02/14/2012   BILITOT 0.8 02/14/2012      Microbiology: Recent Results (from the past 240 hour(s))  CULTURE, BLOOD (ROUTINE X 2)     Status: Normal   Collection Time   02/16/12  4:30 PM      Component Value Range Status Comment   Specimen Description BLOOD RIGHT HAND  3 ML IN [facility] BOTTLE   Final    Special Requests NONE   Final    Culture  Setup Time 02/16/2012 23:17   Final    Culture NO GROWTH 5 DAYS   Final    Report Status 02/22/2012 FINAL   Final   CULTURE, BLOOD (ROUTINE X 2)     Status: Normal   Collection Time   02/16/12  4:48 PM      Component Value Range Status Comment   Specimen Description BLOOD RIGHT ARM  2 ML IN [facility] BOTTLE   Final    Special Requests NONE   Final    Culture  Setup Time 02/16/2012 23:16   Final    Culture NO GROWTH 5 DAYS   Final    Report Status 02/22/2012 FINAL   Final   SURGICAL PCR SCREEN     Status: Normal   Collection Time   02/18/12  5:25 AM      Component Value Range Status Comment   MRSA, PCR NEGATIVE  NEGATIVE Final    Staphylococcus  aureus NEGATIVE  NEGATIVE Final   URINE CULTURE     Status: Normal   Collection Time   02/19/12 10:40 AM      Component Value Range Status Comment   Specimen Description URINE, CATHETERIZED   Final    Special Requests NONE   Final    Culture  Setup Time 02/20/2012 01:28   Final    Colony Count NO GROWTH   Final    Culture NO GROWTH   Final    Report Status 02/20/2012 FINAL   Final   CULTURE, BLOOD (ROUTINE X 2)     Status: Normal (Preliminary result)   Collection Time   02/23/12 10:58 AM      Component Value Range Status Comment   Specimen Description BLOOD LEFT ARM   Final    Special Requests A   Final    Culture  Setup Time 02/23/2012 22:40   Final    Culture  Final    Value:        BLOOD CULTURE RECEIVED NO GROWTH TO DATE CULTURE WILL BE HELD FOR 5 DAYS BEFORE ISSUING A FINAL NEGATIVE REPORT   Report Status PENDING   Incomplete   CULTURE, BLOOD (ROUTINE X 2)     Status: Normal (Preliminary result)   Collection Time   02/23/12 11:07 AM      Component Value Range Status Comment   Specimen Description BLOOD LEFT HAND   Final    Special Requests BOTTLES DRAWN AEROBIC ONLY 1CC   Final    Culture  Setup Time 02/23/2012 22:40   Final    Culture     Final    Value:        BLOOD CULTURE RECEIVED NO GROWTH TO DATE CULTURE WILL BE HELD FOR 5 DAYS BEFORE ISSUING A FINAL NEGATIVE REPORT   Report Status PENDING   Incomplete   URINE CULTURE     Status: Normal   Collection Time   02/23/12  3:44 PM      Component Value Range Status Comment   Specimen Description URINE, CLEAN CATCH   Final    Special Requests NONE   Final    Culture  Setup Time 02/24/2012 03:06   Final    Colony Count NO GROWTH   Final    Culture NO GROWTH   Final    Report Status 02/25/2012 FINAL   Final   CLOSTRIDIUM DIFFICILE BY PCR     Status: Normal   Collection Time   02/24/12 10:52 AM      Component Value Range Status Comment   C difficile by pcr NEGATIVE  NEGATIVE Final     Studies/Results: No results  found.  Assessment/Plan: 1) MRSA bacteremia - repeat cultures remain negative to date.  TTE 1/16 without any significant vegetation.   -she will need a PICC but due to renal disease, picc is not recommended. Has triple lumen -blood cultures 1/18 ng5days and repeat blood cultures on 1/25 ngtd (done for leukocytosis) -leukocytosis bumped up but trending down again -on Cubucin, duration dependent on TEE         Staci Righter, MD [facility] for Infectious Disease [facility] [phone] pager   02/25/2012, 10:57 AM

## 2012-02-25 NOTE — Progress Notes (Signed)
Clinical Social Worker continuing to follow for discharge planning. Pt was unable to discharge during the weekend and MD states possibility for discharge tomorrow back to [facility]. Clinical Social Worker updated facility and updated pt insurance, [employer]. Clinical Social Worker to continue to follow and facilitate pt discharge needs when pt medically stable for discharge.  Jacklynn Lewis, MSW, LCSWA  Clinical Social Work [phone]

## 2012-02-26 ENCOUNTER — Encounter (HOSPITAL_COMMUNITY): Payer: Self-pay | Admitting: Cardiovascular Disease

## 2012-02-26 DIAGNOSIS — R7881 Bacteremia: Secondary | ICD-10-CM

## 2012-02-26 DIAGNOSIS — A4901 Methicillin susceptible Staphylococcus aureus infection, unspecified site: Secondary | ICD-10-CM

## 2012-02-26 LAB — BASIC METABOLIC PANEL
BUN: 39 mg/dL — ABNORMAL HIGH (ref 6–23)
GFR calc Af Amer: 57 mL/min — ABNORMAL LOW (ref >90)
GFR calc non Af Amer: 49 mL/min — ABNORMAL LOW (ref >90)
Potassium: 3.6 mEq/L (ref 3.5–5.1)

## 2012-02-26 LAB — GLUCOSE, CAPILLARY
Glucose-Capillary: 144 mg/dL — ABNORMAL HIGH (ref 70–99)
Glucose-Capillary: 136 mg/dL — ABNORMAL HIGH (ref 70–99)

## 2012-02-26 LAB — CBC
HCT: 27.8 % — ABNORMAL LOW (ref 36.0–46.0)
MCHC: 31.3 g/dL (ref 30.0–36.0)
Platelets: 408 10*3/uL — ABNORMAL HIGH (ref 150–400)
RDW: 16.8 % — ABNORMAL HIGH (ref 11.5–15.5)

## 2012-02-26 MED ORDER — FUROSEMIDE 40 MG PO TABS: 40.0000 mg | ORAL_TABLET | Freq: Two times a day (BID) | ORAL | Status: DC

## 2012-02-26 MED ORDER — OXYCODONE HCL 5 MG PO TABS: 5.0000 mg | ORAL_TABLET | ORAL | Status: DC | PRN

## 2012-02-26 MED ORDER — SODIUM CHLORIDE 0.9 % IV SOLN: 600.0000 mg | INTRAVENOUS | Status: DC

## 2012-02-26 MED ORDER — CARVEDILOL 3.125 MG PO TABS
3.1250 mg | ORAL_TABLET | Freq: Two times a day (BID) | ORAL | Status: DC
  Filled 2012-02-26 (×2): qty 1

## 2012-02-26 NOTE — Progress Notes (Signed)
[facility]  Date of Admission:  02/14/2012  Antibiotics: Vancomycin 8 days  vanco trough 1/22 30.3  Subjective: Patient getting TEE now  Objective: Temp:  [98.3 F (36.8 C)-98.4 F (36.9 C)] 98.3 F (36.8 C) (01/28 0500) Pulse Rate:  [63-74] 74  (01/28 0500) Resp:  [15-16] 15  (01/28 0500) BP: (132-146)/(47-64) 139/64 mmHg (01/28 0500) SpO2:  [91 %-100 %] 92 % (01/28 0745) Weight:  [212 lb 4.9 oz (96.3 kg)] 212 lb 4.9 oz (96.3 kg) (01/28 0500)    Lab Results Lab Results  Component Value Date   WBC 15.2* 02/26/2012   HGB 8.7* 02/26/2012   HCT 27.8* 02/26/2012   MCV 87.1 02/26/2012   PLT 408* 02/26/2012    Lab Results  Component Value Date   CREATININE 1.11* 02/26/2012   BUN 39* 02/26/2012   NA 135 02/26/2012   K 3.6 02/26/2012   CL 95* 02/26/2012   CO2 30 02/26/2012    Lab Results  Component Value Date   ALT 24 02/14/2012   AST 40* 02/14/2012   ALKPHOS 174* 02/14/2012   BILITOT 0.8 02/14/2012      Microbiology: Recent Results (from the past 240 hour(s))  CULTURE, BLOOD (ROUTINE X 2)     Status: Normal   Collection Time   02/16/12  4:30 PM      Component Value Range Status Comment   Specimen Description BLOOD RIGHT HAND  3 ML IN [facility] BOTTLE   Final    Special Requests NONE   Final    Culture  Setup Time 02/16/2012 23:17   Final    Culture NO GROWTH 5 DAYS   Final    Report Status 02/22/2012 FINAL   Final   CULTURE, BLOOD (ROUTINE X 2)     Status: Normal   Collection Time   02/16/12  4:48 PM      Component Value Range Status Comment   Specimen Description BLOOD RIGHT ARM  2 ML IN [facility] BOTTLE   Final    Special Requests NONE   Final    Culture  Setup Time 02/16/2012 23:16   Final    Culture NO GROWTH 5 DAYS   Final    Report Status 02/22/2012 FINAL   Final   SURGICAL PCR SCREEN     Status: Normal   Collection Time   02/18/12  5:25 AM      Component Value Range Status Comment   MRSA, PCR NEGATIVE  NEGATIVE Final    Staphylococcus  aureus NEGATIVE  NEGATIVE Final   URINE CULTURE     Status: Normal   Collection Time   02/19/12 10:40 AM      Component Value Range Status Comment   Specimen Description URINE, CATHETERIZED   Final    Special Requests NONE   Final    Culture  Setup Time 02/20/2012 01:28   Final    Colony Count NO GROWTH   Final    Culture NO GROWTH   Final    Report Status 02/20/2012 FINAL   Final   CULTURE, BLOOD (ROUTINE X 2)     Status: Normal (Preliminary result)   Collection Time   02/23/12 10:58 AM      Component Value Range Status Comment   Specimen Description BLOOD LEFT ARM   Final    Special Requests A   Final    Culture  Setup Time 02/23/2012 22:40   Final    Culture  Final    Value:        BLOOD CULTURE RECEIVED NO GROWTH TO DATE CULTURE WILL BE HELD FOR 5 DAYS BEFORE ISSUING A FINAL NEGATIVE REPORT   Report Status PENDING   Incomplete   CULTURE, BLOOD (ROUTINE X 2)     Status: Normal (Preliminary result)   Collection Time   02/23/12 11:07 AM      Component Value Range Status Comment   Specimen Description BLOOD LEFT HAND   Final    Special Requests BOTTLES DRAWN AEROBIC ONLY 1CC   Final    Culture  Setup Time 02/23/2012 22:40   Final    Culture     Final    Value:        BLOOD CULTURE RECEIVED NO GROWTH TO DATE CULTURE WILL BE HELD FOR 5 DAYS BEFORE ISSUING A FINAL NEGATIVE REPORT   Report Status PENDING   Incomplete   URINE CULTURE     Status: Normal   Collection Time   02/23/12  3:44 PM      Component Value Range Status Comment   Specimen Description URINE, CLEAN CATCH   Final    Special Requests NONE   Final    Culture  Setup Time 02/24/2012 03:06   Final    Colony Count NO GROWTH   Final    Culture NO GROWTH   Final    Report Status 02/25/2012 FINAL   Final   CLOSTRIDIUM DIFFICILE BY PCR     Status: Normal   Collection Time   02/24/12 10:52 AM      Component Value Range Status Comment   C difficile by pcr NEGATIVE  NEGATIVE Final     Studies/Results: No results  found.  Assessment/Plan: 1) MRSA bacteremia - repeat cultures remain negative to date.  TTE 1/16 without any significant vegetation.   -TEE noted, AV and MV with mobile vegetation noted.  Unclear significance, if endocarditis or not.   -I recommend treatment for  weeks total through 03/29/12 per home health protocol         Staci Righter, MD [facility] for Infectious Disease [facility] [phone] pager   02/26/2012, 1:46 PM

## 2012-02-26 NOTE — Progress Notes (Signed)
Patient for discharge back to [facility].  Discharge packet provided in [place], discharge information faxed to facility via [facility], RN provided with phone number to call report, and ambulance transportation arranged.  Notified pt insurance, [employer] and left message with pt guardian.  No further social work needs identified. CSW signing off.  Jacklynn Lewis, MSW, LCSWA  Clinical Social Work [phone]

## 2012-02-26 NOTE — Discharge Summary (Addendum)
Physician Discharge Summary  [name] ZOX:[id number] DOB: 12/29/1940 DOA: 02/14/2012  PCP: No primary provider on file.  Admit date: 02/14/2012 Discharge date: 02/26/2012  Recommendations for Outpatient Follow-up:  1. Pt will need to follow up with PCP in 1 weeks post discharge 2. Please obtain BMP to evaluate electrolytes and kidney function 3. Please also check CBC to evaluate Hg and Hct levels 4. Cubicin 600 mg IV every 24 hours until 03/29/2012 Change central line dressing every 5 days and when necessary Remove central line after last dose of Cubicin Check CPK every 7 days, for draw 02/28/2012--notify physician, Dr. Staci Righter if elevated Followup with Dr. Myrtie Neither  02/29/2012 at 2:45 PM Change dressing on right foot daily with Adaptic or vaseline gauze and wrap with kerlix until further instructions from Dr. Myrtie Neither    Discharge Diagnoses:  Sepsis  -Blood cultures on 02/14/2012 subsequently grew MRSA. -Her antibiotic spectrum was noted. Her Zosyn was discontinued. The patient was continued on vancomycin. -secondary to MRSA Bacteremia  MRSA bacteremia  --TEE on 02/24/12--"Occasional tiny strand like mobile shadow seen on anterior leaflet" -Discussed with ID, Dr. Violet Baldy IV Cubicin for 42 days with a stop date of 03/29/2012--this will mark 6 weeks of IV antibiotics -Check CPK to 7 days while on Cubicin -Remove central line after last dose of Cubicin -Change central line dressing every 5 days and when necessary -TEE 02/21/12--patient had respiratory arrest and TEE was canceled--> repeat TTE performed and was negative for vegetation  -no clear source except necrotic right 2nd toe which was amputated on 1/20. TTEshows no vegetations.  -vancomycin. ( currently held for supratherapeutic levels)  -.Appreciate ID recs.  -Has a necrotic right second toe. MRI of the foot showed osteomyelitis. Seen by orthopedic consult and had amputation of the right second toe done on  1/20 and tolerated procedure well.  -Start patient on Cubicin once vancomycin level subtherapeutic  -Baseline CPK 8  -Anticipated stop date 03/29/2012 unless TEE is neg for vegetation  Leukocytosis  -Remains afebrile and hemodynamically and clinically stable.  -WBC trending down again--15,200 on the day of discharge -Stool for C. difficile PCR--neg  -Repeat UA and urine culture --nondiagnostic--D/C fluconazole  -D/C foley  -Blood cultures x2 sets(02/23/12)-negative  -Chest x-ray repeated 02/24/2012 showed improving interstitial edema   Left Basilar Opacity--?PNA  Given significant hypoxia , tachycardia and leukocytosis patient was admitted to step down monitoring. Patient started on empiric antibiotic IV vancomycin. Blood cultures sent on admission growing MRSA (1/16) on both sets.  -patient remains afebrile. Leukocytosis slowly improving.  -Flu negative  -Continue vancomycin   Acute osteomyelitis right second toe  -Status post amputation 02/18/2012  -patient has follow up appt w/Dr. Montez Morita 02/29/12 -Until patient has followup, change wound dressing on the foot with Adaptic or Vaseline gauze and wrap with Kerlix once daily and prn Acute on Chronic kidney disease  -The patient's initial serum creatinine on the day of admission was 1.48. The patient developed acute on chronic renal failure with a serum creatinine continued to rise throughout the hospitalization. Her serum creatinine peaked at 2.82 on 02/20/2012. -Nephrology was consulted to see the patient. It was thought the patient's acute renal failure is likely due to ATN with the possibility of supratherapeutic levels of vancomycin -Workup including ANA and complements were normal -Vancomycin was discontinued and her random levels were allowed to trend down -Serum creatinine has improved with diuresis  - neg 5800cc with intravenous furosemide  -appreciate renal  -furosemide 40mg  po bid, follow  BMP  -Serum creatinine was 1.11 on the  day of discharge -FeNa calculated was less than 1. Not improved with IV hydration. -Renal ultrasound unremarkable. Repeated proBNP was markedly elevated at 14,005.  -Chest x-ray (1/24/) showed vascular congestion, but improving  -2D echo on admission showed mild diastolic dysfn.  -Appreciate nephrology, continue furosemide IV as outlined  -held vancomycin given elevated levels.  -Avoid NSAIDs nephrotoxic agents.  Acute CHF--diastolic  -Initially, the patient's creatinine increased, furosemide was discontinued.  -The patient developed pulmonary edema and fluid overload  -Intravenous furosemide was reintroduced with improvement of the patient's fluid status and respiratory status  -She was subsequently transitioned to oral furosemide without any difficulty  -She'll be continued on furosemide 40 mg by mouth twice a day  -Ejection fraction 55-60%  -Continue furosemide as discussed  -Continue on carvedilol 3.125 mg twice a day Acute respiratory failure  -resolved  -Likely due to volume overload  -stable on room air  Diabetes mellitus  Continue sliding scale insulin and Lantus.  -CBGs rising with better po intake  Hypertension  Currently stable. Continue home medications-coreg  Dysphagia/esophageal dysmotility  -Speech therapy and gastroenterology consultation were obtained. -The patient was initially placed on a dysphagia 1 diet -Subsequent standard barium swallow was obtained and revealed esophageal dysmotility Seen by speech and swallow and MBS done today which showed esophageal dysmotility.  -Patient informs having esophagial dilatation done in 2001 by Dr. Arlyce Dice.  -Appreciate GI recommendations  On dys level 1 diet--speech continues to follow for safest diet  -02/22/12-diet upgraded to dysphagia 3  -Tolerating dysphagia 3 diet without any difficulty IV access--PICC not good option due to vein sclerosis for future HD  -triple lumen was placed and patient will finish antibiotics with  a triple-lumen catheter in place. Family Communication: none at bedside . Patient's legal guardian [name] was called and updated plan on 1/21  Disposition Plan: back to SNF once stable  Consultants:  [facility]  ID  Orthopedics  [facility] Procedures:  Modified barium swallow ( 1/18)  Right second toe amputation on 1/20  Standard barium swallow ordered  Triple lumen placed 02/22/12   Discharge Condition: stable  Disposition:  Follow-up Information    Follow up with Kennieth Rad, MD. (02/29/12 at 245PM)    Contact information:   [address] [place] [address] [phone]        SNF  Diet: Dysphagia 3 carbohydrate modified Wt Readings from Last 3 Encounters:  02/26/12 96.3 kg (212 lb 4.9 oz)  02/26/12 96.3 kg (212 lb 4.9 oz)  02/26/12 96.3 kg (212 lb 4.9 oz)    History of present illness:  72 year old female from skilled nursing facility was brought to emergency department when it was noted that the patient had oxygen saturation of 78% on room air. The patient is not normally on supplemental oxygen. In emergency department, the patient complained of cough. The patient was found to have leukocytosis with WBC 26,900 and left lower lobe opacity on chest x-ray. Blood cultures were obtained and the patient was started on empiric vancomycin and Zosyn.   Consultants: Infectious disease  Discharge Exam: Filed Vitals:   02/26/12 0500  BP: 139/64  Pulse: 74  Temp: 98.3 F (36.8 C)  Resp: 15   Filed Vitals:   02/25/12 2152 02/25/12 2219 02/26/12 0500 02/26/12 0745  BP: 132/47 138/54 139/64   Pulse: 63  74   Temp: 98.3 F (36.8 C)  98.3 F (36.8 C)   TempSrc: Oral  Oral  Resp: 16  15   Height:      Weight:   96.3 kg (212 lb 4.9 oz)   SpO2: 96%  100% 92%   General: A&O, NAD, pleasant, cooperative Cardiovascular: RRR, no rub, no gallop, no S3 Respiratory: CTAB, no wheeze, no rhonchi Abdomen:soft, nontender, nondistended, positive bowel  sounds Extremities: No edema, No lymphangitis, no petechiae  Discharge Instructions      Discharge Orders    Future Orders Please Complete By Expires   Diet - low sodium heart healthy      Increase activity slowly      Discharge instructions      Comments:   Cubicin 600 mg IV every 24 hours until 03/29/2012 Change central line dressing every 5 days and when necessary Remove central line after last dose of Cubicin Check CPK every 7 days, for draw 02/28/2012--notify physician, Dr. Staci Righter if elevated Followup with Dr. Myrtie Neither  02/29/2012 at 2:45 PM Change dressing on right foot daily with Adaptic or vaseline gauze and wrap with kerlix until further instructions from Dr. Myrtie Neither       Medication List     As of 02/26/2012 11:40 AM    STOP taking these medications         ALEVE 220 MG tablet   Generic drug: naproxen sodium      HYDROcodone-acetaminophen 5-325 MG per tablet   Commonly known as: NORCO/VICODIN      ROCEPHIN 1 G injection   Generic drug: cefTRIAXone      ZITHROMAX Z-PAK 250 MG tablet   Generic drug: azithromycin      TAKE these medications         acetaminophen 325 MG tablet   Commonly known as: TYLENOL   Take 2 tablets (650 mg total) by mouth every 6 (six) hours as needed (or Fever >/= 101).      albuterol (5 MG/ML) 0.5% nebulizer solution   Commonly known as: PROVENTIL   Take 0.5 mLs (2.5 mg total) by nebulization every 4 (four) hours as needed for wheezing.      aspirin EC 325 MG tablet   Take 325 mg by mouth daily.      carvedilol 3.125 MG tablet   Commonly known as: COREG   Take 1 tablet (3.125 mg total) by mouth daily with breakfast.      docusate sodium 100 MG capsule   Commonly known as: COLACE   Take 100 mg by mouth 2 (two) times daily.      DUONEB 0.5-2.5 (3) MG/3ML Soln   Generic drug: ipratropium-albuterol   Take 3 mLs by nebulization every 6 (six) hours as needed. wheezing      feeding supplement Liqd   Take 30  mLs by mouth 3 (three) times daily with meals.      furosemide 40 MG tablet   Commonly known as: LASIX   Take 1 tablet (40 mg total) by mouth 2 (two) times daily.      gabapentin 100 MG capsule   Commonly known as: NEURONTIN   Take 100 mg by mouth at bedtime.      insulin aspart 100 UNIT/ML injection   Commonly known as: novoLOG   Inject 0-9 Units into the skin 3 (three) times daily with meals. CBG < 70: implement hypoglycemia protocol  CBG 70 - 120: 0 units  CBG 121 - 150: 1 unit  CBG 151 - 200: 2 units  CBG 201 - 250: 3 units  CBG 251 - 300:  5 units  CBG 301 - 350: 7 units  CBG 351 - 400: 9 units  CBG > 400 call MD.      insulin glargine 100 UNIT/ML injection   Commonly known as: LANTUS   Inject 10 Units into the skin at bedtime.      ipratropium 0.02 % nebulizer solution   Commonly known as: ATROVENT   Take 2.5 mLs (0.5 mg total) by nebulization every 4 (four) hours as needed.      metoCLOPramide 5 MG tablet   Commonly known as: REGLAN   Take 5 mg by mouth 3 (three) times daily.      multivitamin with minerals tablet   Take 1 tablet by mouth daily.      oxyCODONE 5 MG immediate release tablet   Commonly known as: Oxy IR/ROXICODONE   Take 1 tablet (5 mg total) by mouth every 4 (four) hours as needed.      SIMBRINZA 1-0.2 % Susp   Generic drug: Brinzolamide-Brimonidine   Place 1 drop into the left eye 2 (two) times daily.      sodium chloride 0.65 % Soln nasal spray   Commonly known as: OCEAN   Place 1 spray into the nose 4 (four) times daily as needed for congestion.      sodium chloride 0.9 % SOLN 100 mL with DAPTOmycin 500 MG SOLR 600 mg   Inject 600 mg into the vein daily. Stop date of antibiotics is 03/29/12.           The results of significant diagnostics from this hospitalization (including imaging, microbiology, ancillary and laboratory) are listed below for reference.    Significant Diagnostic Studies: Dg Chest 2 View  02/19/2012   *RADIOLOGY REPORT*  Clinical Data: Weakness.  Evaluate for pulmonary edema  CHEST - 2 VIEW  Comparison: None 02/17/2012  Findings: This examination is technically limited by body habitus and patient positioning (semiupright) Cardiomegaly is stable. There is bilateral pulmonary vascular congestion.  There is a patchy left retrocardiac opacity.  Hazy opacity the right lung base may be due to overlying soft tissues of the breast. On the lateral view, the posterior costophrenic angles are obscured by the table, as the patient was in semiupright position.  IMPRESSION: Cardiomegaly and mild pulmonary vascular congestion. Image detail limited by patient body habitus and positioning. Small left basilar opacity could be due to atelectasis or airspace disease. Small bilateral pleural effusions cannot be excluded, as described above.   Original Report Authenticated By: Britta Mccreedy, M.D.    Dg Chest 2 View  02/17/2012  *RADIOLOGY REPORT*  Clinical Data: Increased cough and shortness of breath.  Question fluid overload.  CHEST - 2 VIEW  Comparison: Chest radiograph 02/15/2012.  Findings: Stable cardiomegaly.  There is diffuse pulmonary vascular congestion with interstitial prominence in the perihilar regions of lung bases.  Patchy airspace opacities in the left mid lung.  Small bilateral pleural effusions.  Thickening of the fissures on the lateral view.  IMPRESSION: Mild congestive heart failure pattern with small bilateral pleural effusions.   Original Report Authenticated By: Britta Mccreedy, M.D.    Ct Head Wo Contrast  02/14/2012  *RADIOLOGY REPORT*  Clinical Data: Altered mental status.  History of weakness and TIA.  CT HEAD WITHOUT CONTRAST  Technique:  Contiguous axial images were obtained from the base of the skull through the vertex without contrast.  Comparison: 12/25/2008  Findings: Bone windows demonstrate similar abnormal appearance of the right globe, presumably related to prior trauma or  hemorrhage. There are  also similar calcifications about the left globe.  Mucosal thickening of the sphenoid sinus and ethmoid air cells.  A left mastoid effusion is again identified.  Soft tissue windows demonstrate similar moderate hydrocephalus, including the fourth, third, and lateral ventricles.  Slight increase in periventricular white matter hypoattenuation.  Redemonstration of right frontal hypoattenuation which is felt to be similar, including on image 20/series 2.  No mass lesion, hemorrhage, intra-axial, or extra-axial fluid collection.  IMPRESSION:  1.  Similar hydrocephalus. 2.  Slight increase in periventricular white matter hypoattenuation.  Favor small vessel ischemic change.  A component of transependymal CSF resorption could look similar. 3.  Right frontal lobe remote infarct. 4.  Sinus disease with a left mastoid effusion.   Original Report Authenticated By: Jeronimo Greaves, M.D.    Dg Esophagus  02/19/2012  *RADIOLOGY REPORT*  Clinical Data: Dilated esophagus on modified barium swallow.  The patient reports no dysphagia or difficulty edema drinking.  ESOPHOGRAM/BARIUM SWALLOW  Technique:  Single contrast examination was performed using thin barium.  Fluoroscopy time:  2.3 minutes.  Comparison:  None.  Findings:  Exam is somewhat limited due to patient immobility. Patient had a toe amputation 1 day prior.  The esophagus is patulous.  There is significant esophageal dysmotility with tertiary contractions and to-and-fro motion of the barium bolus.  There is some mild irregularity at the GE junction with mild narrowing.  A 13 mm barium tablet did pass GE junction easily.  IMPRESSION:  1.  Significant esophageal dysmotility with tertiary contractions and to-and-fro motion of the barium bolus.  Patulous esophagus.  2.  Some mild stricturing of the distal esophagus.  Cannot exclude a mucosal irregularity. 3.  Barium tablet passed through the GE junction easily.   Original Report Authenticated By: Genevive Bi, M.D.    US  Renal  02/17/2012  *RADIOLOGY REPORT*  Clinical Data: Acute renal insufficiency with decreased urine output.  RENAL/URINARY TRACT ULTRASOUND COMPLETE  Comparison:  CT 06/26/2011  Findings:  Right Kidney:  12.9 cm. No hydronephrosis.  Small renal calculi identified. Normal renal cortical thickness and echogenicity.  Left Kidney:  11.9 cm. No hydronephrosis.  Small renal calculi identified. Normal renal cortical thickness and echogenicity.  Bladder:  Collapsed around a Foley catheter.  IMPRESSION: No hydronephrosis.  Renal collecting system calculi, as before.   Original Report Authenticated By: Jeronimo Greaves, M.D.    Mr Foot Right Wo Contrast  02/17/2012  *RADIOLOGY REPORT*  Clinical Data: Necrotic second toe.  MRI OF THE RIGHT FOREFOOT WITHOUT CONTRAST  Technique:  Multiplanar, multisequence MR imaging was performed. No intravenous contrast was administered.  Comparison: Radiographs 02/17/2012.  Findings: Abnormal signal intensity in the distal phalanx of the second toe suspicious for osteomyelitis.  No findings to suggest septic arthritis.  There is mild diffuse cellulitis and diffuse myofasciitis.  No focal soft tissue abscess.  IMPRESSION:  1.  Osteomyelitis involving the distal phalanx of the second toe. 2.  Cellulitis and myofasciitis without focal soft tissue abscess.   Original Report Authenticated By: Rudie Meyer, M.D.    Dg Chest Port 1 View  02/23/2012  *RADIOLOGY REPORT*  Clinical Data: Shortness of breath.  Leukocytosis.  Follow up CHF/pulmonary edema.  PORTABLE CHEST - 1 VIEW 02/23/2012 1046 hours:  Comparison: Portable chest x-ray yesterday.  Two-view chest x-ray 02/19/2012, 02/17/2012.  Findings: Cardiac silhouette enlarged but stable.  Interval improvement in the interstitial and airspace pulmonary edema since yesterday, with residual minimal perihilar airspace opacities bilaterally.  No  new pulmonary parenchymal abnormalities.  Left jugular central venous catheter tip projects over the lower  SVC, unchanged.  IMPRESSION: Improving pulmonary edema, with residual perihilar airspace edema persisting.  No new abnormalities.   Original Report Authenticated By: Hulan Saas, M.D.    Dg Chest Port 1 View  02/22/2012  *RADIOLOGY REPORT*  Clinical Data: Line placement.  PORTABLE CHEST - 1 VIEW  Comparison: 02/19/2012  Findings: Left central line has been placed with the tip in the upper right atrium.  No pneumothorax.  Cardiomegaly with vascular congestion and diffuse bilateral opacities, likely mild edema.  No effusions.  IMPRESSION: Left central line tip in the upright atrium.  No pneumothorax.  Mild bilateral airspace opacities, presumably mild edema.   Original Report Authenticated By: Charlett Nose, M.D.    Portable Chest 1 View  02/15/2012  *RADIOLOGY REPORT*  Clinical Data: CHF.  PORTABLE CHEST - 1 VIEW  Comparison: 02/14/2012  Findings: Shallow inspiration.  Cardiac enlargement with pulmonary vascular congestion and perihilar infiltration suggesting edema or pneumonia.  The infiltration is slightly more prominent than previous study.  No pneumothorax or effusion.  IMPRESSION: Cardiac enlargement with pulmonary vascular congestion.  Increasing perihilar infiltrates or edema bilaterally.   Original Report Authenticated By: Burman Nieves, M.D.    Dg Chest Portable 1 View  02/14/2012  *RADIOLOGY REPORT*  Clinical Data: Shortness of breath and diabetes.  Hypertension.  PORTABLE CHEST - 1 VIEW  Comparison: Two-view chest x-ray and CT of the chest 06/22/2011.  Findings: The heart is mildly enlarged.  Lung volumes are low. Mild interstitial and airspace disease is present bilaterally. The bone windows are unremarkable.  IMPRESSION:  1.  Mild cardiomegaly and edema is concerning for congestive heart failure. 2.  There is some airspace disease bilaterally which raises the possibility of infection as well.   Original Report Authenticated By: Marin Roberts, M.D.    Dg Foot 2 Views  Right  02/17/2012  *RADIOLOGY REPORT*  Clinical Data: Necrotic second toe right foot.  History of diabetes.  Staph bacteremia.  RIGHT FOOT - 2 VIEW  Comparison: None.  Findings: Underlying renal osteodystrophy with vascular calcifications.  Only AP and lateral radiographs performed; no oblique imaging.  Overlap of the digits on the lateral view. Question soft tissue ulcer about the lateral aspect of the second toe distally on AP view.  No gross osseous destruction.  No callus deposition.  IMPRESSION: Limited 2 view study, demonstrating no gross osseous destruction. Possible soft tissue defect about the second toe laterally.  If ongoing clinical concern, consider contrast enhanced MRI.   Original Report Authenticated By: Jeronimo Greaves, M.D.    Dg Swallowing Func-speech Pathology  02/16/2012  Chales Abrahams, CCC-SLP     02/16/2012 10:47 AM Objective Swallowing Evaluation: Modified Barium Swallowing Study   Patient Details  Name: [name] MRN: [id number] Date of Birth: 01-21-1941  Today's Date: 02/16/2012 Time: 0950-1015 SLP Time Calculation (min): 25 min  Past Medical History:  Past Medical History  Diagnosis Date  . Hypertension   . Diabetes mellitus   . Blindness   . TIA (transient ischemic attack)   . Anemia   . GERD (gastroesophageal reflux disease)   . Renal disorder   . Constipation    Past Surgical History:  Past Surgical History  Procedure Date  . Gallbladder surgery   . Esophageal dilation   . Cholecystectomy    HPI:  72 yo female adm to [facility] from [facility] with  hypoxia, CXR + fluid, +/-  pna- diagnosed with HCAP, ? CHF (await  BNP).  Per MD notes, pt told MD that she "always has pna."   PMH  + for TIA, GERD, anemia, constipation, HTN, blindness/HOH. SLP  phoned SNF SLP who reports she has not seen pt, she has however  seen the pt's roommate during meals and has not noted pt coughing  during meals.  Per SNF SLP, pt was recently "flagged for weight  loss" .  Esophageal dilatation  completed in 03/1999 and pt states  she had problems swallowing before surgery that resolved after.    BSE completed Thursday pm and follow up Friday with  recommendation for MBS due to inability to rule out  dysphagia/aspiration at bedside with pt overtly coughing.     Assessment / Plan / Recommendation Clinical Impression  Dysphagia Diagnosis: Suspected primary esophageal dysphagia;Mild  oral phase dysphagia;Mild pharyngeal phase dysphagia   Clinical impression: Pt presents with mild oropharyngeal and  suspected primary esophageal dysphagia.  Decreased oral control  results in premature spillage of liquids into pharynx and  piecemeal deglutition with solids/pudding as well as oral stasis  with poor awareness.  Cued swallow facilitated timeliness of  swallow.  Pharyngeal dysphagia with delayed swallow initiation to  pyriform sinus with liquids, to vallecular space with  solid/pudding and  resultant trace silent penetration of liquids.   Cued throat clear removed trace penetrates but pt requiried  verbal cues x4 to complete and became agitated.  Pt prematurely  spills pudding/cracker filling entire vallecular space for longer  than 30 seconds as she continued to orally manipulate remainder  of bolus before she actually triggers a swallow.  No aspiration  or penetration with pudding/cracker noted.  Pt did not arrive to  test with her dentures unfortunately, therefore cracker was  softened.       Suspect esophageal deficits significantly contribute to  oropharyngeal deficits.  Appearance of prominent cricopharyngeus  with retention of secretions x1, CP did not impact barium flow.   However pt did appear with stasis from distal to mid esophagus  with distal narrowing.  Above area of narrowed region, esophagus  appeared dilated (? Motility issues).  Retrograde propulsion of  barium occurred WITHOUT pt awareness.   Suspect pt's primary  dysphagia is esophageal.    Pt was instructed to compensatory strategies, testing  findings  and recommendations during the procedure. Pt may benefit from GI  referral for esophageal dysphagia management (h/o esophageal  dilatation in 2001).  In the interim time, rec liquid diet in  small amounts for maximal airway protection and medications be  crushed given with liquids if not contraindicated.  SLP to  follow.  Thanks.       Treatment Recommendation  Therapy as outlined in treatment plan below    Diet Recommendation Thin liquid (All Liquids)   Liquid Administration via: Cup;Straw Medication Administration: Other (Comment) (crush with liquids) Supervision: Full supervision/cueing for compensatory strategies Compensations: Small sips/bites;Slow rate;Clear throat  intermittently Postural Changes and/or Swallow Maneuvers: Seated upright 90  degrees;Upright 30-60 min after meal    Other  Recommendations Recommended Consults: Consider GI  evaluation Oral Care Recommendations: Oral care BID   Follow Up Recommendations       Frequency and Duration min 2x/week  1 week   Pertinent Vitals/Pain     SLP Swallow Goals Patient will utilize recommended strategies during swallow to  increase swallowing safety with: Total assistance   General HPI: 72 yo female adm to [facility] from [facility]  SNF with hypoxia, CXR + fluid, +/- pna- diagnosed with HCAP, ?  CHF (await BNP).  Per MD notes, pt told MD that she "always has  pna."   PMH + for TIA, GERD, anemia, constipation, HTN,  blindness/HOH. SLP phoned SNF SLP who reports she has not seen  pt, she has however seen the pt's roommate during meals and has  not noted pt coughing during meals.  Per SNF SLP, pt was recently  "flagged for weight loss" .  Esophageal dilatation completed in  03/1999 and pt states she had problems swallowing before surgery  that resolved after.   BSE completed Thursday pm and follow up  Friday with recommendation for MBS due to inability to rule out  dysphagia/aspiration at bedside with pt overtly coughing. Type of Study: Modified  Barium Swallowing Study Reason for Referral: Objectively evaluate swallowing function Diet Prior to this Study: Nectar-thick liquids;Regular Temperature Spikes Noted: No Respiratory Status: Supplemental O2 delivered via (comment) Behavior/Cognition: Alert;Cooperative;Requires cueing;Decreased  sustained attention;Agitated (easily agitated when asked  questions) Oral Cavity - Dentition: Dentures, not available (dentures left  in pt's room) Oral Motor / Sensory Function: Impaired - see Bedside swallow  eval Self-Feeding Abilities: Needs assist Patient Positioning: Upright in chair Baseline Vocal Quality: Clear Volitional Swallow: Unable to elicit (pt states she didn't have  anything to swallow) Anatomy: Within functional limits Pharyngeal Secretions: Not observed secondary MBS    Reason for Referral Objectively evaluate swallowing function   Oral Phase Oral Preparation/Oral Phase Oral Phase: Impaired Oral - Nectar Oral - Nectar Teaspoon:  (decr oral control results in premature  spillage ) Oral - Nectar Cup:  (decr oral control results in premature  spillage) Oral - Thin Oral - Thin Teaspoon:  (decr oral control results in premature  spillage) Oral - Thin Cup:  (decr oral control results in premature  spillage) Oral - Thin Straw:  (decr oral control results in premature  spillage) Oral - Solids Oral - Puree: Weak lingual manipulation;Delayed oral  transit;Piecemeal swallowing;Lingual/palatal residue Oral - Regular: Piecemeal swallowing;Weak lingual  manipulation;Delayed oral transit;Impaired  mastication;Lingual/palatal residue Oral Phase - Comment Oral Phase - Comment: decr oral control results in premature  spillage of all liquids, pt did not have dentures present for  exam- cracker was softened with thin barium and pudding   Pharyngeal Phase Pharyngeal Phase Pharyngeal Phase: Impaired Pharyngeal - Nectar Pharyngeal - Nectar Teaspoon: Premature spillage to valleculae Pharyngeal - Nectar Cup: Delayed swallow  initiation;Premature  spillage to pyriform sinuses Pharyngeal - Thin Pharyngeal - Thin Teaspoon: Premature spillage to pyriform  sinuses;Penetration/Aspiration during swallow Penetration/Aspiration details (thin teaspoon): Material enters  airway, remains ABOVE vocal cords then ejected out Pharyngeal - Thin Cup: Premature spillage to pyriform  sinuses;Penetration/Aspiration during swallow Penetration/Aspiration details (thin cup): Material enters  airway, CONTACTS cords and not ejected out (cued throat clear  removed trace amount of penetration) Pharyngeal - Thin Straw: Premature spillage to pyriform sinuses Pharyngeal - Solids Pharyngeal - Puree: Delayed swallow initiation;Premature spillage  to pyriform sinuses (retention of barium at vallecular space  before swlalow) Pharyngeal - Regular: Premature spillage to valleculae;Delayed  swallow initiation (retention of cracker at vallecular space  before swallow)  Cervical Esophageal Phase    GO    Cervical Esophageal Phase Cervical Esophageal Phase: Impaired Cervical Esophageal Phase - Comment Cervical Esophageal Comment: appearance of prominent  cricopharyngeus with retention of secretions x1, did not impact  barium flow, appearance of stasis with distal narrowing and  appearance of dilated esophagus above narrowed  region, retrograde  propulsion of barium appeared WITHOUT pt awareness          Donavan Burnet, MS [facility] SLP [phone]       Microbiology: Recent Results (from the past 240 hour(s))  CULTURE, BLOOD (ROUTINE X 2)     Status: Normal   Collection Time   02/16/12  4:30 PM      Component Value Range Status Comment   Specimen Description BLOOD RIGHT HAND  3 ML IN [facility] BOTTLE   Final    Special Requests NONE   Final    Culture  Setup Time 02/16/2012 23:17   Final    Culture NO GROWTH 5 DAYS   Final    Report Status 02/22/2012 FINAL   Final   CULTURE, BLOOD (ROUTINE X 2)     Status: Normal   Collection Time   02/16/12  4:48 PM      Component Value  Range Status Comment   Specimen Description BLOOD RIGHT ARM  2 ML IN [facility] BOTTLE   Final    Special Requests NONE   Final    Culture  Setup Time 02/16/2012 23:16   Final    Culture NO GROWTH 5 DAYS   Final    Report Status 02/22/2012 FINAL   Final   SURGICAL PCR SCREEN     Status: Normal   Collection Time   02/18/12  5:25 AM      Component Value Range Status Comment   MRSA, PCR NEGATIVE  NEGATIVE Final    Staphylococcus aureus NEGATIVE  NEGATIVE Final   URINE CULTURE     Status: Normal   Collection Time   02/19/12 10:40 AM      Component Value Range Status Comment   Specimen Description URINE, CATHETERIZED   Final    Special Requests NONE   Final    Culture  Setup Time 02/20/2012 01:28   Final    Colony Count NO GROWTH   Final    Culture NO GROWTH   Final    Report Status 02/20/2012 FINAL   Final   CULTURE, BLOOD (ROUTINE X 2)     Status: Normal (Preliminary result)   Collection Time   02/23/12 10:58 AM      Component Value Range Status Comment   Specimen Description BLOOD LEFT ARM   Final    Special Requests A   Final    Culture  Setup Time 02/23/2012 22:40   Final    Culture     Final    Value:        BLOOD CULTURE RECEIVED NO GROWTH TO DATE CULTURE WILL BE HELD FOR 5 DAYS BEFORE ISSUING A FINAL NEGATIVE REPORT   Report Status PENDING   Incomplete   CULTURE, BLOOD (ROUTINE X 2)     Status: Normal (Preliminary result)   Collection Time   02/23/12 11:07 AM      Component Value Range Status Comment   Specimen Description BLOOD LEFT HAND   Final    Special Requests BOTTLES DRAWN AEROBIC ONLY 1CC   Final    Culture  Setup Time 02/23/2012 22:40   Final    Culture     Final    Value:        BLOOD CULTURE RECEIVED NO GROWTH TO DATE CULTURE WILL BE HELD FOR 5 DAYS BEFORE ISSUING A FINAL NEGATIVE REPORT   Report Status PENDING   Incomplete   URINE CULTURE     Status: Normal   Collection  Time   02/23/12  3:44 PM      Component Value Range Status Comment   Specimen Description URINE,  CLEAN CATCH   Final    Special Requests NONE   Final    Culture  Setup Time 02/24/2012 03:06   Final    Colony Count NO GROWTH   Final    Culture NO GROWTH   Final    Report Status 02/25/2012 FINAL   Final   CLOSTRIDIUM DIFFICILE BY PCR     Status: Normal   Collection Time   02/24/12 10:52 AM      Component Value Range Status Comment   C difficile by pcr NEGATIVE  NEGATIVE Final      Labs: Basic Metabolic Panel:  Lab 02/26/12 1610 02/25/12 0600 02/24/12 0520 02/23/12 0500 02/22/12 0540 02/21/12 0609 02/20/12 0500  NA 135 132* 134* 135 138 -- --  K 3.6 3.8 -- -- -- -- --  CL 95* 95* 95* 98 99 -- --  CO2 30 28 30 31 27  -- --  GLUCOSE 111* 179* 201* 201* 192* -- --  BUN 39* 40* 40* 49* 58* -- --  CREATININE 1.11* 1.15* 1.14* 1.28* 1.70* -- --  CALCIUM 8.8 8.7 8.7 8.6 8.9 -- --  MG -- -- -- -- -- -- --  PHOS -- -- -- 3.1 3.3 4.1 5.0*   Liver Function Tests:  Lab 02/23/12 0500 02/22/12 0540 02/21/12 0609 02/20/12 0500  AST -- -- -- --  ALT -- -- -- --  ALKPHOS -- -- -- --  BILITOT -- -- -- --  PROT -- -- -- --  ALBUMIN 2.1* 2.2* 2.0* 2.0*   No results found for this basename: LIPASE:5,AMYLASE:5 in the last 168 hours No results found for this basename: AMMONIA:5 in the last 168 hours CBC:  Lab 02/26/12 0710 02/25/12 0600 02/24/12 0520 02/23/12 0500 02/22/12 0540 02/20/12 0710  WBC 15.2* 17.5* 19.2* 20.3* 15.8* --  NEUTROABS -- -- 14.7* 15.6* -- 10.5*  HGB 8.7* 8.7* 9.2* 9.3* 10.4* --  HCT 27.8* 27.9* 28.2* 29.3* 31.7* --  MCV 87.1 86.9 86.2 86.2 84.1 --  PLT 408* 363 413* 419* 391 --   Cardiac Enzymes:  Lab 02/24/12 0520 02/20/12 0710  CKTOTAL 12 8  CKMB -- --  CKMBINDEX -- --  TROPONINI -- --   BNP: No components found with this basename: POCBNP:5 CBG:  Lab 02/26/12 0759 02/25/12 2201 02/25/12 1750 02/25/12 1230 02/25/12 1208  GLUCAP 136* 157* 151* 138* 144*    Time coordinating discharge:  Greater than 30 minutes  Signed:  Juanitta Earnhardt, DO [facility] Pager: [phone] 02/26/2012, 11:40 AM

## 2012-02-26 NOTE — Progress Notes (Signed)
Report called to Tish, Charity fundraiser at [employer].

## 2012-02-29 LAB — CULTURE, BLOOD (ROUTINE X 2): Culture: NO GROWTH

## 2012-04-29 ENCOUNTER — Other Ambulatory Visit: Payer: Self-pay | Admitting: *Deleted

## 2012-04-29 MED ORDER — OXYCODONE HCL 5 MG PO TABS: ORAL_TABLET | ORAL | Status: DC

## 2012-05-29 DIAGNOSIS — N289 Disorder of kidney and ureter, unspecified: Secondary | ICD-10-CM

## 2012-05-29 HISTORY — DX: Disorder of kidney and ureter, unspecified: N28.9

## 2012-06-13 ENCOUNTER — Non-Acute Institutional Stay (SKILLED_NURSING_FACILITY): Payer: Medicare Other | Admitting: Nurse Practitioner

## 2012-06-13 DIAGNOSIS — I10 Essential (primary) hypertension: Secondary | ICD-10-CM

## 2012-06-13 DIAGNOSIS — E119 Type 2 diabetes mellitus without complications: Secondary | ICD-10-CM

## 2012-06-13 DIAGNOSIS — M1710 Unilateral primary osteoarthritis, unspecified knee: Secondary | ICD-10-CM

## 2012-06-13 DIAGNOSIS — M170 Bilateral primary osteoarthritis of knee: Secondary | ICD-10-CM

## 2012-06-13 DIAGNOSIS — D649 Anemia, unspecified: Secondary | ICD-10-CM

## 2012-06-13 NOTE — Progress Notes (Signed)
Patient ID: [name], female   DOB: 08-21-1940, 72 y.o.   MRN: [id number]  Nursing Home Location:  [facility]   Place of Service: SNF [phone])   Chief Complaint: medical management of chronic conditions   HPI:  This is a 72 year old female who is a long term resident of [facility] being seen today for routine follow up. Pt without any complaints at this time and staff with no current concerns.   250.72-DM, COMP CIRCULATION TYPE II UNCONTROLLED The diabetes remains stable.No complications noted from the medication presently being used.takes novolog 5 units prior to meals for cbg >=150 and lantus 10 units qhs 356.9-NEUROPATHY, PERIPHERAL The peripheral neuropathy is stable.No complications noted from the medication presently being used.taking Gabapentin 100mg  nightly.  365.9-GLAUCOMA The glaucoma symptoms are stable.No complications noted from the medication presently being used. uses simbrinza to left eye twice daily  401.1-HYPERTENSION, BENIGN The blood pressure readings taken outside the office since the last visit have been in the target range. No complications noted from the medication presently being used.takes coreg 3.125 mg twice daily  428.42-CHRONIC COMB SYSTOLIC&DIASTOLIC HEART FAILURE The congestive heart failure is unchanged.The patient has pedal edema that is unchanged.No complications noted from the medication presently being used. takes lasix 40 mg twice daily  530.11-GERD The patient's dyspeptic symptoms remain stable.No complications noted from the medication presently being used.is taking reglan 5 mg three times daily 715.90-OSTEOARTHRITIS .The patient has severe joint pain.No complications noted from the medication presently being used. has vicodin 5/325 mg every 6 hours as needed for pain  Review of Systems:  Review of Systems  Constitutional: Negative for fever, chills and malaise/fatigue.  Respiratory: Negative for cough and shortness of breath.    Cardiovascular: Positive for leg swelling (chronic). Negative for chest pain and palpitations.  Gastrointestinal: Negative for heartburn, abdominal pain, diarrhea and constipation.  Musculoskeletal: Positive for joint pain.  Skin: Negative.   Neurological: Negative for weakness and headaches.     Medications: Patient's Medications  New Prescriptions   POLYETHYLENE GLYCOL (MIRALAX / GLYCOLAX) PACKET    Take 17 g by mouth daily.  Previous Medications   ACETAMINOPHEN (TYLENOL) 325 MG TABLET    Take 2 tablets (650 mg total) by mouth every 6 (six) hours as needed (or Fever >/= 101).   ASPIRIN EC 325 MG TABLET    Take 325 mg by mouth daily.     BRINZOLAMIDE-BRIMONIDINE (SIMBRINZA) 1-0.2 % SUSP    Place 1 drop into the left eye 2 (two) times daily.   CARVEDILOL (COREG) 3.125 MG TABLET    Take 1 tablet (3.125 mg total) by mouth daily with breakfast.   DOCUSATE SODIUM (COLACE) 100 MG CAPSULE    Take 100 mg by mouth 2 (two) times daily.   FUROSEMIDE (LASIX) 40 MG TABLET    Take 1 tablet (40 mg total) by mouth 2 (two) times daily.   GABAPENTIN (NEURONTIN) 100 MG CAPSULE    Take 100 mg by mouth at bedtime.   HYDROCORTISONE (ANUSOL-HC) 2.5 % RECTAL CREAM    Place 1 application rectally 2 (two) times daily.   INSULIN GLARGINE (LANTUS) 100 UNIT/ML INJECTION    Inject 10 Units into the skin at bedtime.   IPRATROPIUM-ALBUTEROL (DUONEB) 0.5-2.5 (3) MG/3ML SOLN    Take 3 mLs by nebulization every 6 (six) hours as needed. wheezing   METOCLOPRAMIDE (REGLAN) 5 MG TABLET    Take 5 mg by mouth 3 (three) times daily.   MULTIPLE VITAMINS-MINERALS (MULTIVITAMIN WITH  MINERALS) TABLET    Take 1 tablet by mouth daily.   SODIUM CHLORIDE (OCEAN) 0.65 % SOLN NASAL SPRAY    Place 1 spray into the nose 4 (four) times daily as needed for congestion.  Modified Medications   Modified Medication Previous Medication   INSULIN ASPART (NOVOLOG) 100 UNIT/ML INJECTION insulin aspart (NOVOLOG) 100 UNIT/ML injection      Inject 5  Units into the skin 3 (three) times daily before meals.    Inject 0-9 Units into the skin 3 (three) times daily with meals. CBG < 70: implement hypoglycemia protocol CBG 70 - 120: 0 units CBG 121 - 150: 1 unit CBG 151 - 200: 2 units CBG 201 - 250: 3 units CBG 251 - 300: 5 units CBG 301 - 350: 7 units CBG 351 - 400: 9 units CBG > 400 call MD.   OXYCODONE (OXY IR/ROXICODONE) 5 MG IMMEDIATE RELEASE TABLET oxyCODONE (OXY IR/ROXICODONE) 5 MG immediate release tablet      Take one tablet every 4 hours as needed for pain    Take one tablet every 4 hours as needed for pain   SULFAMETHOXAZOLE-TRIMETHOPRIM (BACTRIM DS,SEPTRA DS) 800-160 MG PER TABLET sulfamethoxazole-trimethoprim (SEPTRA DS) 800-160 MG per tablet      Take 1 tablet by mouth 2 (two) times daily.    Take 1 tablet by mouth 2 (two) times daily.  Discontinued Medications   ALBUTEROL (PROVENTIL) (5 MG/ML) 0.5% NEBULIZER SOLUTION    Take 0.5 mLs (2.5 mg total) by nebulization every 4 (four) hours as needed for wheezing.   FEEDING SUPPLEMENT (PRO-STAT SUGAR FREE 64) LIQD    Take 30 mLs by mouth 3 (three) times daily with meals.   IPRATROPIUM (ATROVENT) 0.02 % NEBULIZER SOLUTION    Take 2.5 mLs (0.5 mg total) by nebulization every 4 (four) hours as needed.   SODIUM CHLORIDE 0.9 % SOLN 100 ML WITH DAPTOMYCIN 500 MG SOLR 600 MG    Inject 600 mg into the vein daily. Stop date of antibiotics is 03/29/12.     Physical Exam:  Filed Vitals:   06/13/12 1334  BP: 135/62  Pulse: 64  Temp: 97.4 F (36.3 C)  Resp: 18  Weight: 201 lb (91.173 kg)   Physical Exam  Constitutional: No distress.  HENT:  Head: Normocephalic and atraumatic.  Cardiovascular: Normal rate and normal heart sounds.   Pulmonary/Chest: Effort normal and breath sounds normal. No respiratory distress.  Abdominal: Soft. Bowel sounds are normal. She exhibits no distension. There is no tenderness.  Musculoskeletal: She exhibits edema. She exhibits no tenderness.  Neurological:  She is alert.  Skin: Skin is warm and dry. She is not diaphoretic. No erythema.     Assessment/Plan DM Will follow up a1c  ESSENTIAL HYPERTENSION Patient is stable; continue current regimen. Will monitor and make changes as necessary.   Osteoarthritis of both knees Unchanged Cont current medications  Anemia Will follow up CBC      Labs/tests ordered CBC, CMP, A1c

## 2012-06-17 ENCOUNTER — Encounter (HOSPITAL_COMMUNITY): Payer: Self-pay | Admitting: *Deleted

## 2012-06-17 ENCOUNTER — Emergency Department (HOSPITAL_COMMUNITY)
Admission: EM | Admit: 2012-06-17 | Discharge: 2012-06-18 | Disposition: A | Discharge status: Home / Self Care | Payer: Medicare Other | Attending: Emergency Medicine | Admitting: Emergency Medicine

## 2012-06-17 DIAGNOSIS — N898 Other specified noninflammatory disorders of vagina: Secondary | ICD-10-CM | POA: Insufficient documentation

## 2012-06-17 DIAGNOSIS — R7989 Other specified abnormal findings of blood chemistry: Secondary | ICD-10-CM | POA: Insufficient documentation

## 2012-06-17 DIAGNOSIS — E119 Type 2 diabetes mellitus without complications: Secondary | ICD-10-CM | POA: Insufficient documentation

## 2012-06-17 DIAGNOSIS — Z862 Personal history of diseases of the blood and blood-forming organs and certain disorders involving the immune mechanism: Secondary | ICD-10-CM | POA: Insufficient documentation

## 2012-06-17 DIAGNOSIS — Z8673 Personal history of transient ischemic attack (TIA), and cerebral infarction without residual deficits: Secondary | ICD-10-CM | POA: Insufficient documentation

## 2012-06-17 DIAGNOSIS — Z87448 Personal history of other diseases of urinary system: Secondary | ICD-10-CM | POA: Insufficient documentation

## 2012-06-17 DIAGNOSIS — Z9089 Acquired absence of other organs: Secondary | ICD-10-CM | POA: Insufficient documentation

## 2012-06-17 DIAGNOSIS — Z7982 Long term (current) use of aspirin: Secondary | ICD-10-CM | POA: Insufficient documentation

## 2012-06-17 DIAGNOSIS — R1031 Right lower quadrant pain: Secondary | ICD-10-CM | POA: Insufficient documentation

## 2012-06-17 DIAGNOSIS — N390 Urinary tract infection, site not specified: Secondary | ICD-10-CM

## 2012-06-17 DIAGNOSIS — Z794 Long term (current) use of insulin: Secondary | ICD-10-CM | POA: Insufficient documentation

## 2012-06-17 DIAGNOSIS — R799 Abnormal finding of blood chemistry, unspecified: Secondary | ICD-10-CM

## 2012-06-17 DIAGNOSIS — Z79899 Other long term (current) drug therapy: Secondary | ICD-10-CM | POA: Insufficient documentation

## 2012-06-17 DIAGNOSIS — K219 Gastro-esophageal reflux disease without esophagitis: Secondary | ICD-10-CM | POA: Insufficient documentation

## 2012-06-17 DIAGNOSIS — N939 Abnormal uterine and vaginal bleeding, unspecified: Secondary | ICD-10-CM

## 2012-06-17 DIAGNOSIS — I10 Essential (primary) hypertension: Secondary | ICD-10-CM | POA: Insufficient documentation

## 2012-06-17 DIAGNOSIS — K5900 Constipation, unspecified: Secondary | ICD-10-CM | POA: Insufficient documentation

## 2012-06-17 DIAGNOSIS — IMO0002 Reserved for concepts with insufficient information to code with codable children: Secondary | ICD-10-CM | POA: Insufficient documentation

## 2012-06-17 DIAGNOSIS — H543 Unqualified visual loss, both eyes: Secondary | ICD-10-CM | POA: Insufficient documentation

## 2012-06-17 LAB — CBC WITH DIFFERENTIAL/PLATELET
Basophils Absolute: 0.1 10*3/uL (ref 0.0–0.1)
Eosinophils Absolute: 0.5 10*3/uL (ref 0.0–0.7)
Eosinophils Relative: 6 % — ABNORMAL HIGH (ref 0–5)
Lymphocytes Relative: 27 % (ref 12–46)
Lymphs Abs: 2.3 10*3/uL (ref 0.7–4.0)
MCV: 85.3 fL (ref 78.0–100.0)
Neutrophils Relative %: 61 % (ref 43–77)
Platelets: 265 10*3/uL (ref 150–400)
RBC: 4.01 MIL/uL (ref 3.87–5.11)
RDW: 15.6 % — ABNORMAL HIGH (ref 11.5–15.5)
WBC: 8.6 10*3/uL (ref 4.0–10.5)

## 2012-06-17 NOTE — ED Provider Notes (Signed)
History     CSN: [id number]  Arrival date & time 06/17/12  2221   First MD Initiated Contact with Patient 06/17/12 2300      Chief Complaint  Patient presents with  . Vaginal Bleeding    (Consider location/radiation/quality/duration/timing/severity/associated sxs/prior treatment) HPI 72 year old female presents to emergency room with reported vaginal bleeding.  It is reported that patient had vaginal bleeding on Saturday.  Patient is complaining of pain in her right lower abdomen.  Patient is blind, and is unsure where she is bleeding from.  She does not remember the last time she saw OB/GYN.  She denies any urinary issues, no constipation, or rectal pain. No prior history of vaginal bleeding since menopause.  Past Medical History  Diagnosis Date  . Hypertension   . Diabetes mellitus   . Blindness   . TIA (transient ischemic attack)   . Anemia   . GERD (gastroesophageal reflux disease)   . Renal disorder   . Constipation     Past Surgical History  Procedure Laterality Date  . Gallbladder surgery    . Esophageal dilation    . Cholecystectomy    . Amputation  02/18/2012    Procedure: AMPUTATION RAY;  Surgeon: Kennieth Rad, MD;  Location: [facility];  Service: Orthopedics;  Laterality: Right;  right second toe  . Tee without cardioversion  02/21/2012    Procedure: TRANSESOPHAGEAL ECHOCARDIOGRAM (TEE);  Surgeon: Ricki Rodriguez, MD;  Location: [facility] ENDOSCOPY;  Service: Cardiovascular;  Laterality: N/A;   spoke with [name] from [facility] pt will arrive by 815ish( thy change shifts at 7a)  . Tee without cardioversion  02/25/2012    Procedure: TRANSESOPHAGEAL ECHOCARDIOGRAM (TEE);  Surgeon: Ricki Rodriguez, MD;  Location: [facility] ENDOSCOPY;  Service: Cardiovascular;  Laterality: N/A;    No family history on file.  History  Substance Use Topics  . Smoking status: Never Smoker   . Smokeless tobacco: Current User    Types: Snuff  . Alcohol Use: No    OB History   Grav Para Term Preterm  Abortions TAB SAB Ect Mult Living                  Review of Systems  See History of Present Illness; otherwise all other systems are reviewed and negative  Allergies  Review of patient's allergies indicates no known allergies.  Home Medications   Current Outpatient Rx  Name  Route  Sig  Dispense  Refill  . acetaminophen (TYLENOL) 325 MG tablet   Oral   Take 2 tablets (650 mg total) by mouth every 6 (six) hours as needed (or Fever >/= 101).         Marland Kitchen aspirin EC 325 MG tablet   Oral   Take 325 mg by mouth daily.           . Brinzolamide-Brimonidine (SIMBRINZA) 1-0.2 % SUSP   Left Eye   Place 1 drop into the left eye 2 (two) times daily.         . carvedilol (COREG) 3.125 MG tablet   Oral   Take 1 tablet (3.125 mg total) by mouth daily with breakfast.         . docusate sodium (COLACE) 100 MG capsule   Oral   Take 100 mg by mouth 2 (two) times daily.         . furosemide (LASIX) 40 MG tablet   Oral   Take 1 tablet (40 mg total) by mouth 2 (two) times daily.  60 tablet   1   . gabapentin (NEURONTIN) 100 MG capsule   Oral   Take 100 mg by mouth at bedtime.         . hydrocortisone (ANUSOL-HC) 2.5 % rectal cream   Rectal   Place 1 application rectally 2 (two) times daily.         . insulin aspart (NOVOLOG) 100 UNIT/ML injection   Subcutaneous   Inject 5 Units into the skin 3 (three) times daily before meals.         . insulin glargine (LANTUS) 100 UNIT/ML injection   Subcutaneous   Inject 10 Units into the skin at bedtime.         Marland Kitchen ipratropium-albuterol (DUONEB) 0.5-2.5 (3) MG/3ML SOLN   Nebulization   Take 3 mLs by nebulization every 6 (six) hours as needed. wheezing         . metoCLOPramide (REGLAN) 5 MG tablet   Oral   Take 5 mg by mouth 3 (three) times daily.         . Multiple Vitamins-Minerals (MULTIVITAMIN WITH MINERALS) tablet   Oral   Take 1 tablet by mouth daily.         Marland Kitchen oxyCODONE (OXY IR/ROXICODONE) 5 MG immediate  release tablet      Take one tablet every 4 hours as needed for pain   180 tablet   0   . sodium chloride (OCEAN) 0.65 % SOLN nasal spray   Nasal   Place 1 spray into the nose 4 (four) times daily as needed for congestion.           BP 135/54  Pulse 69  Temp(Src) 97.8 F (36.6 C) (Oral)  Resp 18  SpO2 98%  Physical Exam  Nursing note and vitals reviewed. Constitutional: She appears well-developed and well-nourished.  HENT:  Head: Normocephalic and atraumatic.  Nose: Nose normal.  Mouth/Throat: Oropharynx is clear and moist.  Eyes: Conjunctivae and EOM are normal. Pupils are equal, round, and reactive to light.  Neck: Normal range of motion. Neck supple. No JVD present. No tracheal deviation present. No thyromegaly present.  Cardiovascular: Normal rate, regular rhythm, normal heart sounds and intact distal pulses.  Exam reveals no gallop and no friction rub.   No murmur heard. Pulmonary/Chest: Effort normal and breath sounds normal. No stridor. No respiratory distress. She has no wheezes. She has no rales. She exhibits no tenderness.  Abdominal: Soft. Bowel sounds are normal. She exhibits no distension and no mass. There is tenderness (Mild tenderness in right lower quadrant). There is no rebound and no guarding.  Genitourinary:  Bimanual exam shows a firm cervix, enlarged uterus.  Speculum exam shows a dark blood in vault unable to ascertain source.  Rectal exam shows soft stool, but no fistula noted  Musculoskeletal: Normal range of motion. She exhibits no edema and no tenderness.  Lymphadenopathy:    She has no cervical adenopathy.  Neurological: She is alert. She exhibits normal muscle tone. Coordination normal.  Skin: Skin is warm and dry. No rash noted. No erythema. No pallor.  Psychiatric: She has a normal mood and affect. Her behavior is normal. Judgment and thought content normal.    ED Course  Procedures (including critical care time)  Labs Reviewed  CBC WITH  DIFFERENTIAL - Abnormal; Notable for the following:    Hemoglobin 10.9 (*)    HCT 34.2 (*)    RDW 15.6 (*)    Eosinophils Relative 6 (*)    All other  components within normal limits  URINALYSIS, ROUTINE W REFLEX MICROSCOPIC - Abnormal; Notable for the following:    APPearance CLOUDY (*)    Hgb urine dipstick TRACE (*)    Leukocytes, UA LARGE (*)    All other components within normal limits  BASIC METABOLIC PANEL - Abnormal; Notable for the following:    Sodium 133 (*)    Chloride 95 (*)    Glucose, Bld 201 (*)    BUN 68 (*)    GFR calc non Af Amer 50 (*)    GFR calc Af Amer 58 (*)    All other components within normal limits  URINE MICROSCOPIC-ADD ON - Abnormal; Notable for the following:    Bacteria, UA MANY (*)    All other components within normal limits  URINE CULTURE  OCCULT BLOOD, POC DEVICE  SAMPLE TO BLOOD BANK   Ct Abdomen Pelvis W Contrast  06/18/2012   *RADIOLOGY REPORT*  Clinical Data: Vaginal bleeding.  CT ABDOMEN AND PELVIS WITH CONTRAST  Technique:  Multidetector CT imaging of the abdomen and pelvis was performed following the standard protocol during bolus administration of intravenous contrast.  Contrast: OMNIPAQUE IOHEXOL 300 MG/ML  SOLN  Comparison: 06/26/2011  Findings: The lung bases are clear except for right basilar scarring changes.  The heart is mildly enlarged.  Coronary artery calcifications are noted.  There is a small hiatal hernia noted.  The liver is unremarkable.  No focal hepatic lesions or intrahepatic biliary dilatation.  A calcified granuloma is noted in the left lobe.  The gallbladder is surgically absent.  No common bile duct dilatation.  The pancreas is unremarkable.  The spleen is normal in size.  No focal lesions.  The adrenal glands and kidneys appear stable.  There are bilateral renal calculi and renal scarring changes but no hydronephrosis or obstructing ureteral calculi.  No bladder calculi.  The stomach, duodenum, small bowel and colon  are unremarkable.  The appendix is normal.  No mesenteric or retroperitoneal mass or adenopathy.  The aorta is normal in caliber.  Moderate atherosclerotic calcifications involving the aorta, branch vessels and iliac arteries.  The bladder is mildly distended.  The uterus appears stable. Suspect fundal fibroids but no obvious endometrial or cervical mass.  There is moderate fecal impaction in the left with mild rectal wall thickening.  No pelvic mass, adenopathy or free pelvic fluid collections.  No inguinal mass or adenopathy.  The bony structures are intact.  There are degenerative changes involving the hips, SI joints and spine.  IMPRESSION:  1.  No acute abdominal/pelvic findings, mass lesions or adenopathy. 2.  Bilateral renal calculi and renal scarring changes. 3.  Slightly enlarged uterus with suspected fibroids unchanged since prior study.  No endometrial or adnexal mass is identified. 4.  Mild fecal impaction in the rectum.   Original Report Authenticated By: Rudie Meyer, M.D.     1. Abnormal vaginal bleeding   2. Constipation   3. Elevated BUN   4. Urinary tract infection       MDM  72 year old female with reported vaginal bleeding.  We'll do a speculum exam.  We'll check labs.  If blood noted on speculum exam, will proceed with imaging.  Patient will need close followup with GYN  Patient had CT scan, no specific source of bleeding.  She is noted to have fibroids.  We'll have the nursing home.  Arrange close followup with GYN.  She has a mild urinary tract infection.  Elevated BUN most  likely due to dehydration.  She is constipated on CT scan        Olivia Mackie, MD 06/18/12 [phone]

## 2012-06-17 NOTE — ED Notes (Signed)
EMS transport from [facility] on [address]- pt is blind- reported that she had vaginal bleeding on Saturday- c/o low abd pain

## 2012-06-17 NOTE — ED Notes (Signed)
JYN:[id number]<AO> Expected date:<BR> Expected time:<BR> Means of arrival:<BR> Comments:<BR> EMS/72 yo female with vaginal bleeding

## 2012-06-18 ENCOUNTER — Emergency Department (HOSPITAL_COMMUNITY): Payer: Medicare Other

## 2012-06-18 ENCOUNTER — Encounter (HOSPITAL_COMMUNITY): Payer: Self-pay

## 2012-06-18 LAB — URINALYSIS, ROUTINE W REFLEX MICROSCOPIC
Glucose, UA: NEGATIVE mg/dL
Ketones, ur: NEGATIVE mg/dL
Nitrite: NEGATIVE
Protein, ur: NEGATIVE mg/dL

## 2012-06-18 LAB — BASIC METABOLIC PANEL
BUN: 68 mg/dL — ABNORMAL HIGH (ref 6–23)
Calcium: 9.5 mg/dL (ref 8.4–10.5)
Chloride: 95 mEq/L — ABNORMAL LOW (ref 96–112)
Creatinine, Ser: 1.09 mg/dL (ref 0.50–1.10)
GFR calc Af Amer: 58 mL/min — ABNORMAL LOW (ref >90)
GFR calc non Af Amer: 50 mL/min — ABNORMAL LOW (ref >90)
Glucose, Bld: 201 mg/dL — ABNORMAL HIGH (ref 70–99)
Potassium: 4.2 mEq/L (ref 3.5–5.1)

## 2012-06-18 LAB — SAMPLE TO BLOOD BANK

## 2012-06-18 MED ORDER — IOHEXOL 300 MG/ML  SOLN
50.0000 mL | Freq: Once | INTRAMUSCULAR | Status: AC | PRN
  Administered 2012-06-18: 50 mL via ORAL

## 2012-06-18 MED ORDER — SULFAMETHOXAZOLE-TMP DS 800-160 MG PO TABS
1.0000 | ORAL_TABLET | Freq: Once | ORAL | Status: AC
  Administered 2012-06-18: 1 via ORAL
  Filled 2012-06-18: qty 1

## 2012-06-18 MED ORDER — SULFAMETHOXAZOLE-TRIMETHOPRIM 800-160 MG PO TABS: 1.0000 | ORAL_TABLET | Freq: Two times a day (BID) | ORAL | Status: DC

## 2012-06-18 MED ORDER — POLYETHYLENE GLYCOL 3350 17 G PO PACK: 17.0000 g | PACK | Freq: Every day | ORAL | Status: AC

## 2012-06-18 MED ORDER — IOHEXOL 300 MG/ML  SOLN
100.0000 mL | Freq: Once | INTRAMUSCULAR | Status: AC | PRN
  Administered 2012-06-18: 100 mL via INTRAVENOUS

## 2012-06-18 NOTE — ED Notes (Signed)
Patient transported to CT 

## 2012-06-21 LAB — URINE CULTURE

## 2012-06-24 ENCOUNTER — Inpatient Hospital Stay (HOSPITAL_COMMUNITY): Payer: Medicare Other

## 2012-06-24 ENCOUNTER — Inpatient Hospital Stay (HOSPITAL_COMMUNITY)
Admission: EM | Admit: 2012-06-24 | Discharge: 2012-06-28 | DRG: 683 | Disposition: A | Discharge status: Skilled Nursing Facility | Payer: Medicare Other | Attending: Internal Medicine | Admitting: Internal Medicine

## 2012-06-24 ENCOUNTER — Other Ambulatory Visit: Payer: Self-pay | Admitting: Geriatric Medicine

## 2012-06-24 ENCOUNTER — Encounter (HOSPITAL_COMMUNITY): Payer: Self-pay

## 2012-06-24 DIAGNOSIS — E1151 Type 2 diabetes mellitus with diabetic peripheral angiopathy without gangrene: Secondary | ICD-10-CM | POA: Diagnosis present

## 2012-06-24 DIAGNOSIS — I10 Essential (primary) hypertension: Secondary | ICD-10-CM

## 2012-06-24 DIAGNOSIS — D649 Anemia, unspecified: Secondary | ICD-10-CM | POA: Insufficient documentation

## 2012-06-24 DIAGNOSIS — I5022 Chronic systolic (congestive) heart failure: Secondary | ICD-10-CM | POA: Diagnosis present

## 2012-06-24 DIAGNOSIS — N390 Urinary tract infection, site not specified: Secondary | ICD-10-CM | POA: Diagnosis present

## 2012-06-24 DIAGNOSIS — H543 Unqualified visual loss, both eyes: Secondary | ICD-10-CM | POA: Diagnosis present

## 2012-06-24 DIAGNOSIS — E119 Type 2 diabetes mellitus without complications: Secondary | ICD-10-CM

## 2012-06-24 DIAGNOSIS — Z7401 Bed confinement status: Secondary | ICD-10-CM

## 2012-06-24 DIAGNOSIS — I509 Heart failure, unspecified: Secondary | ICD-10-CM | POA: Diagnosis present

## 2012-06-24 DIAGNOSIS — K219 Gastro-esophageal reflux disease without esophagitis: Secondary | ICD-10-CM | POA: Diagnosis present

## 2012-06-24 DIAGNOSIS — M170 Bilateral primary osteoarthritis of knee: Secondary | ICD-10-CM | POA: Diagnosis present

## 2012-06-24 DIAGNOSIS — Z87891 Personal history of nicotine dependence: Secondary | ICD-10-CM

## 2012-06-24 DIAGNOSIS — N181 Chronic kidney disease, stage 1: Secondary | ICD-10-CM | POA: Diagnosis present

## 2012-06-24 DIAGNOSIS — I129 Hypertensive chronic kidney disease with stage 1 through stage 4 chronic kidney disease, or unspecified chronic kidney disease: Secondary | ICD-10-CM | POA: Diagnosis present

## 2012-06-24 DIAGNOSIS — M1710 Unilateral primary osteoarthritis, unspecified knee: Secondary | ICD-10-CM | POA: Diagnosis present

## 2012-06-24 DIAGNOSIS — Z8673 Personal history of transient ischemic attack (TIA), and cerebral infarction without residual deficits: Secondary | ICD-10-CM

## 2012-06-24 DIAGNOSIS — H547 Unspecified visual loss: Secondary | ICD-10-CM | POA: Diagnosis present

## 2012-06-24 DIAGNOSIS — N179 Acute kidney failure, unspecified: Principal | ICD-10-CM | POA: Diagnosis present

## 2012-06-24 LAB — URINALYSIS, ROUTINE W REFLEX MICROSCOPIC
Nitrite: NEGATIVE
Protein, ur: NEGATIVE mg/dL
Specific Gravity, Urine: 1.017 (ref 1.005–1.030)
Urobilinogen, UA: 0.2 mg/dL (ref 0.0–1.0)

## 2012-06-24 LAB — COMPREHENSIVE METABOLIC PANEL
AST: 19 U/L (ref 0–37)
BUN: 127 mg/dL — ABNORMAL HIGH (ref 6–23)
CO2: 21 mEq/L (ref 19–32)
Calcium: 9.1 mg/dL (ref 8.4–10.5)
Creatinine, Ser: 4.15 mg/dL — ABNORMAL HIGH (ref 0.50–1.10)
GFR calc Af Amer: 12 mL/min — ABNORMAL LOW (ref >90)
GFR calc non Af Amer: 10 mL/min — ABNORMAL LOW (ref >90)
Glucose, Bld: 118 mg/dL — ABNORMAL HIGH (ref 70–99)

## 2012-06-24 LAB — URINE MICROSCOPIC-ADD ON

## 2012-06-24 LAB — CBC WITH DIFFERENTIAL/PLATELET
Basophils Absolute: 0 10*3/uL (ref 0.0–0.1)
Eosinophils Relative: 8 % — ABNORMAL HIGH (ref 0–5)
HCT: 28.5 % — ABNORMAL LOW (ref 36.0–46.0)
Lymphocytes Relative: 17 % (ref 12–46)
MCV: 81.9 fL (ref 78.0–100.0)
Monocytes Absolute: 0.6 10*3/uL (ref 0.1–1.0)
Monocytes Relative: 7 % (ref 3–12)
RDW: 15.4 % (ref 11.5–15.5)
WBC: 8.3 10*3/uL (ref 4.0–10.5)

## 2012-06-24 LAB — GLUCOSE, CAPILLARY: Glucose-Capillary: 101 mg/dL — ABNORMAL HIGH (ref 70–99)

## 2012-06-24 MED ORDER — SODIUM CHLORIDE 0.9 % IV BOLUS (SEPSIS)
500.0000 mL | Freq: Once | INTRAVENOUS | Status: AC
  Administered 2012-06-24: 500 mL via INTRAVENOUS

## 2012-06-24 MED ORDER — OXYCODONE HCL 5 MG PO TABS: ORAL_TABLET | ORAL | Status: DC

## 2012-06-24 MED ORDER — PIPERACILLIN-TAZOBACTAM 3.375 G IVPB 30 MIN
3.3750 g | Freq: Once | INTRAVENOUS | Status: AC
  Administered 2012-06-24: 3.375 g via INTRAVENOUS
  Filled 2012-06-24: qty 50

## 2012-06-24 MED ORDER — DEXTROSE 5 % IV SOLN: 1.0000 g | Freq: Once | INTRAVENOUS | Status: DC

## 2012-06-24 NOTE — Assessment & Plan Note (Signed)
Unchanged Cont current medications

## 2012-06-24 NOTE — ED Notes (Signed)
Ultrasound staff at bedside--- US renal ongoing.

## 2012-06-24 NOTE — ED Notes (Signed)
Pt resides from [facility].  Lab work results showed UTI.

## 2012-06-24 NOTE — Assessment & Plan Note (Signed)
Patient is stable; continue current regimen. Will monitor and make changes as necessary.  

## 2012-06-24 NOTE — ED Notes (Signed)
Patient refused blood draw for labs.RN Isaias Cowman made aware

## 2012-06-24 NOTE — H&P (Signed)
[facility] History and Physical  [name] ZOX:[id number] DOB: 06/27/1940    PCP:  Dow Adolph.  Chief Complaint: Not feeling well.  HPI: [name] is an 72 y.o. female nursing home resident, bedbound, with hx of DM, HTN, blindness, prior TIA, recently treated for UTI with Bactrim (unfortunately culture grew Kleb Pneumo resistant to it, among many others), presents to the ER at [facility] only complaining of feeling malaise.  Evaluation in the ER showed that she is in ARF with Cr of 4, and persistent UTI with UA showing 21-50 WBC and rare bacteria.  She remained hemodynamically stable, appears clinically volume depleted, with BUN of 127.  She has been on diuretics.  Hospitalist was asked to admit her for UTI, AKI, malaise, and volume depletion.  Rewiew of Systems:  Constitutional: Negative for malaise, fever and chills. No significant weight loss or weight gain Eyes: Negative for eye pain, redness and discharge, diplopia, visual changes, or flashes of light. ENMT: Negative for ear pain, hoarseness, nasal congestion, sinus pressure and sore throat. No headaches; tinnitus, drooling, or problem swallowing. Cardiovascular: Negative for chest pain, palpitations, diaphoresis, dyspnea and peripheral edema. ; No orthopnea, PND Respiratory: Negative for cough, hemoptysis, wheezing and stridor. No pleuritic chestpain. Gastrointestinal: Negative for nausea, vomiting, diarrhea, constipation, abdominal pain, melena, blood in stool, hematemesis, jaundice and rectal bleeding.    Genitourinary: Negative for frequency, dysuria, incontinence,flank pain and hematuria; Musculoskeletal: Negative for back pain and neck pain. Negative for swelling and trauma.;  Skin: . Negative for pruritus, rash, abrasions, bruising and skin lesion.; ulcerations Neuro: Negative for headache, lightheadedness and neck stiffness. Negative for weakness, altered level of consciousness , altered mental status, extremity  weakness, burning feet, involuntary movement, seizure and syncope.  Psych: negative for anxiety, depression, insomnia, tearfulness, panic attacks, hallucinations, paranoia, suicidal or homicidal ideation    Past Medical History  Diagnosis Date  . Hypertension   . Diabetes mellitus   . Blindness   . TIA (transient ischemic attack)   . Anemia   . GERD (gastroesophageal reflux disease)   . Renal disorder   . Constipation     Past Surgical History  Procedure Laterality Date  . Gallbladder surgery    . Esophageal dilation    . Cholecystectomy    . Amputation  02/18/2012    Procedure: AMPUTATION RAY;  Surgeon: Kennieth Rad, MD;  Location: [facility];  Service: Orthopedics;  Laterality: Right;  right second toe  . Tee without cardioversion  02/21/2012    Procedure: TRANSESOPHAGEAL ECHOCARDIOGRAM (TEE);  Surgeon: Ricki Rodriguez, MD;  Location: [facility] ENDOSCOPY;  Service: Cardiovascular;  Laterality: N/A;   spoke with [name] from [facility] pt will arrive by 815ish( thy change shifts at 7a)  . Tee without cardioversion  02/25/2012    Procedure: TRANSESOPHAGEAL ECHOCARDIOGRAM (TEE);  Surgeon: Ricki Rodriguez, MD;  Location: [facility] ENDOSCOPY;  Service: Cardiovascular;  Laterality: N/A;    Medications:  HOME MEDS: Prior to Admission medications   Medication Sig Start Date End Date Taking? Authorizing Provider  acetaminophen (TYLENOL) 325 MG tablet Take 2 tablets (650 mg total) by mouth every 6 (six) hours as needed (or Fever >/= 101). 06/29/11 06/28/12 Yes Srikar Cherlynn Kaiser, MD  aspirin EC 325 MG tablet Take 325 mg by mouth daily.     Yes Historical Provider, MD  Brinzolamide-Brimonidine [facility]) 1-0.2 % SUSP Place 1 drop into the left eye 2 (two) times daily.   Yes Historical Provider, MD  carvedilol (COREG) 3.125 MG tablet Take  1 tablet (3.125 mg total) by mouth daily with breakfast. 06/29/11 06/28/12 Yes Srikar Cherlynn Kaiser, MD  docusate sodium (COLACE) 100 MG capsule Take 100 mg by mouth 2 (two) times daily.    Yes Historical Provider, MD  furosemide (LASIX) 40 MG tablet Take 1 tablet (40 mg total) by mouth 2 (two) times daily. 02/26/12  Yes Catarina Hartshorn, MD  gabapentin (NEURONTIN) 100 MG capsule Take 100 mg by mouth at bedtime.   Yes Historical Provider, MD  hydrocortisone (ANUSOL-HC) 2.5 % rectal cream Place 1 application rectally 2 (two) times daily.   Yes Historical Provider, MD  insulin aspart (NOVOLOG) 100 UNIT/ML injection Inject 5 Units into the skin 3 (three) times daily before meals. 06/29/11 06/28/12 Yes Srikar Cherlynn Kaiser, MD  insulin glargine (LANTUS) 100 UNIT/ML injection Inject 10 Units into the skin at bedtime.   Yes Historical Provider, MD  ipratropium-albuterol (DUONEB) 0.5-2.5 (3) MG/3ML SOLN Take 3 mLs by nebulization every 6 (six) hours as needed. wheezing   Yes Historical Provider, MD  metoCLOPramide (REGLAN) 5 MG tablet Take 5 mg by mouth 3 (three) times daily.   Yes Historical Provider, MD  Multiple Vitamins-Minerals (MULTIVITAMIN WITH MINERALS) tablet Take 1 tablet by mouth daily.   Yes Historical Provider, MD  oxyCODONE (OXY IR/ROXICODONE) 5 MG immediate release tablet Take one tablet every 4 hours as needed for pain 06/24/12  Yes Mahima Pandey, MD  polyethylene glycol (MIRALAX / GLYCOLAX) packet Take 17 g by mouth daily. 06/18/12  Yes Olivia Mackie, MD  sodium chloride (OCEAN) 0.65 % SOLN nasal spray Place 1 spray into the nose 4 (four) times daily as needed for congestion. 06/29/11  Yes Srikar Cherlynn Kaiser, MD  sulfamethoxazole-trimethoprim (BACTRIM DS,SEPTRA DS) 800-160 MG per tablet Take 1 tablet by mouth 2 (two) times daily. 06/18/12 06/26/12 Yes Olivia Mackie, MD     Allergies:  No Known Allergies  Social History:   reports that she quit smoking about 13 years ago. Her smokeless tobacco use includes Snuff. She reports that she does not drink alcohol or use illicit drugs.  Family History: History reviewed. No pertinent family history.   Physical Exam: Filed Vitals:   06/24/12 1838  BP:  123/65  Pulse: 65  Temp: 97.9 F (36.6 C)  TempSrc: Oral  Resp: 16  SpO2: 96%   Blood pressure 123/65, pulse 65, temperature 97.9 F (36.6 C), temperature source Oral, resp. rate 16, SpO2 96.00%.  GEN:  Pleasant patient lying in the stretcher in no acute distress; cooperative with exam. She is blind PSYCH:  alert and oriented x4; does not appear anxious or depressed; affect is appropriate. HEENT: Mucous membranes pink and anicteric;no cervical lymphadenopathy nor thyromegaly or carotid bruit; no JVD; There were no stridor. Neck is very supple. Breasts:: Not examined CHEST WALL: No tenderness CHEST: Normal respiration, clear to auscultation bilaterally.  HEART: Regular rate and rhythm.  There are no murmur, rub, or gallops.   BACK: No kyphosis or scoliosis; no CVA tenderness ABDOMEN: soft and non-tender; no masses, no organomegaly, normal abdominal bowel sounds; no pannus; no intertriginous candida. There is no rebound and no distention. Rectal Exam: Not done EXTREMITIES: No bone or joint deformity; age-appropriate arthropathy of the hands and knees; no edema; no ulcerations.  There is no calf tenderness. She is in AFO bilaterally. Genitalia: not examined PULSES: 2+ and symmetric SKIN: Normal hydration no rash or ulceration CNS: Cranial nerves 2-12 grossly intact no focal lateralizing neurologic deficit.  Speech is fluent; uvula elevated  with phonation, facial symmetry and tongue midline. DTR are normal bilaterally, cerebella exam is intact, barbinski is negative and strengths are equaled bilaterally.  No sensory loss.   Labs on Admission:  Basic Metabolic Panel:  Recent Labs Lab 06/17/12 2345 06/24/12 2015  NA 133* 131*  K 4.2 5.0  CL 95* 94*  CO2 27 21  GLUCOSE 201* 118*  BUN 68* 127*  CREATININE 1.09 4.15*  CALCIUM 9.5 9.1   Liver Function Tests:  Recent Labs Lab 06/24/12 2015  AST 19  ALT 48*  ALKPHOS 121*  BILITOT 0.2*  PROT 8.1  ALBUMIN 3.0*   No results  found for this basename: LIPASE, AMYLASE,  in the last 168 hours No results found for this basename: AMMONIA,  in the last 168 hours CBC:  Recent Labs Lab 06/17/12 2345 06/24/12 2015  WBC 8.6 8.3  NEUTROABS 5.3 5.6  HGB 10.9* 9.4*  HCT 34.2* 28.5*  MCV 85.3 81.9  PLT 265 287   Cardiac Enzymes: No results found for this basename: CKTOTAL, CKMB, CKMBINDEX, TROPONINI,  in the last 168 hours  CBG: No results found for this basename: GLUCAP,  in the last 168 hours   Radiological Exams on Admission: No results found.  Assessment/Plan Present on Admission:  . Acute renal failure . DM . ESSENTIAL HYPERTENSION . Osteoarthritis of both knees . BLINDNESS, BILATERAL  PLAN:  I suspect she went into ARF because of pre-renal in addition to having nephrotoxic antibiotic (BactrimDS).  Unfortunately, her UTI is resistant to it any how.  Will give her IVF, and stop her diuretics.  I ordered renal US and urine Na just to be prudent.  Will give her Zosyn, renally adjusted per pharmacy, for her UTI.  For her DM, will continue her current regimen and add sensitive coverage with hs.  I will transfer her to [facility], though I suspect her Cr will improve.  She is stable, full code (confirmed with her tonight) , and will be admitted to [facility] service.  Thank you for allowing me to partake in the care of your nice patient.  Other plans as per orders.  Code Status:FULL CODE.   Houston Siren, MD. [facility] Pager [phone] 7pm to 7am.  06/24/2012, 10:19 PM

## 2012-06-24 NOTE — ED Provider Notes (Signed)
History     CSN: [id number]  Arrival date & time 06/24/12  9528   First MD Initiated Contact with Patient 06/24/12 1836      Chief Complaint  Patient presents with  . Urinary Tract Infection    (Consider location/radiation/quality/duration/timing/severity/associated sxs/prior treatment) HPI [facility]. Pt denies dysuria. Reports ongoing RLQ pain for which she was evaluated for 6 days ago with CT scan. No new pain. No fever, chills. No weakness.  Past Medical History  Diagnosis Date  . Hypertension   . Diabetes mellitus   . Blindness   . TIA (transient ischemic attack)   . Anemia   . GERD (gastroesophageal reflux disease)   . Renal disorder   . Constipation     Past Surgical History  Procedure Laterality Date  . Gallbladder surgery    . Esophageal dilation    . Cholecystectomy    . Amputation  02/18/2012    Procedure: AMPUTATION RAY;  Surgeon: Kennieth Rad, MD;  Location: [facility] ORS;  Service: Orthopedics;  Laterality: Right;  right second toe  . Tee without cardioversion  02/21/2012    Procedure: TRANSESOPHAGEAL ECHOCARDIOGRAM (TEE);  Surgeon: Ricki Rodriguez, MD;  Location: [facility] ENDOSCOPY;  Service: Cardiovascular;  Laterality: N/A;   spoke with [name] from [facility] pt will arrive by 815ish( thy change shifts at 7a)  . Tee without cardioversion  02/25/2012    Procedure: TRANSESOPHAGEAL ECHOCARDIOGRAM (TEE);  Surgeon: Ricki Rodriguez, MD;  Location: [facility] ENDOSCOPY;  Service: Cardiovascular;  Laterality: N/A;    History reviewed. No pertinent family history.  History  Substance Use Topics  . Smoking status: Former Smoker -- 0 years    Quit date: 06/25/1999  . Smokeless tobacco: Current User    Types: Snuff  . Alcohol Use: No    OB History   Grav Para Term Preterm Abortions TAB SAB Ect Mult Living                  Review of Systems  Constitutional: Negative for fever and chills.  HENT: Negative for neck pain and neck stiffness.    Respiratory: Positive for shortness of breath.   Cardiovascular: Negative for chest pain and palpitations.  Gastrointestinal: Positive for abdominal pain and constipation. Negative for nausea and vomiting.  Genitourinary: Negative for dysuria, frequency, flank pain and pelvic pain.  Musculoskeletal: Negative for myalgias.  Skin: Negative for rash and wound.  Neurological: Negative for weakness, numbness and headaches.  All other systems reviewed and are negative.    Allergies  Review of patient's allergies indicates no known allergies.  Home Medications   Current Outpatient Rx  Name  Route  Sig  Dispense  Refill  . acetaminophen (TYLENOL) 325 MG tablet   Oral   Take 2 tablets (650 mg total) by mouth every 6 (six) hours as needed (or Fever >/= 101).         Marland Kitchen aspirin EC 325 MG tablet   Oral   Take 325 mg by mouth daily.           . Brinzolamide-Brimonidine (SIMBRINZA) 1-0.2 % SUSP   Left Eye   Place 1 drop into the left eye 2 (two) times daily.         . carvedilol (COREG) 3.125 MG tablet   Oral   Take 1 tablet (3.125 mg total) by mouth daily with breakfast.         . docusate sodium (COLACE) 100 MG capsule  Oral   Take 100 mg by mouth 2 (two) times daily.         . furosemide (LASIX) 40 MG tablet   Oral   Take 1 tablet (40 mg total) by mouth 2 (two) times daily.   60 tablet   1   . gabapentin (NEURONTIN) 100 MG capsule   Oral   Take 100 mg by mouth at bedtime.         . hydrocortisone (ANUSOL-HC) 2.5 % rectal cream   Rectal   Place 1 application rectally 2 (two) times daily.         . insulin aspart (NOVOLOG) 100 UNIT/ML injection   Subcutaneous   Inject 5 Units into the skin 3 (three) times daily before meals.         . insulin glargine (LANTUS) 100 UNIT/ML injection   Subcutaneous   Inject 10 Units into the skin at bedtime.         Marland Kitchen ipratropium-albuterol (DUONEB) 0.5-2.5 (3) MG/3ML SOLN   Nebulization   Take 3 mLs by nebulization  every 6 (six) hours as needed. wheezing         . metoCLOPramide (REGLAN) 5 MG tablet   Oral   Take 5 mg by mouth 3 (three) times daily.         . Multiple Vitamins-Minerals (MULTIVITAMIN WITH MINERALS) tablet   Oral   Take 1 tablet by mouth daily.         Marland Kitchen oxyCODONE (OXY IR/ROXICODONE) 5 MG immediate release tablet      Take one tablet every 4 hours as needed for pain   180 tablet   0   . polyethylene glycol (MIRALAX / GLYCOLAX) packet   Oral   Take 17 g by mouth daily.   14 each   0   . sodium chloride (OCEAN) 0.65 % SOLN nasal spray   Nasal   Place 1 spray into the nose 4 (four) times daily as needed for congestion.         . sulfamethoxazole-trimethoprim (BACTRIM DS,SEPTRA DS) 800-160 MG per tablet   Oral   Take 1 tablet by mouth 2 (two) times daily.           BP 123/65  Pulse 65  Temp(Src) 97.9 F (36.6 C) (Oral)  Resp 16  SpO2 96%  Physical Exam  Nursing note and vitals reviewed. Constitutional: She is oriented to person, place, and time. She appears well-developed and well-nourished. No distress.  HENT:  Head: Normocephalic and atraumatic.  Mouth/Throat: Oropharynx is clear and moist.  Neck: Normal range of motion. Neck supple.  Cardiovascular: Normal rate and regular rhythm.   Pulmonary/Chest: Effort normal and breath sounds normal. No respiratory distress. She has no wheezes. She has no rales. She exhibits no tenderness.  Abdominal: Soft. Bowel sounds are normal. She exhibits no distension and no mass. There is tenderness (mild TTP RLQ, no rebound or guarding). There is no rebound and no guarding.  Musculoskeletal: Normal range of motion. She exhibits no edema and no tenderness.  Neurological: She is alert and oriented to person, place, and time.  Moves all ext, sensation grossly intact  Skin: Skin is warm and dry. No rash noted. No erythema.  Psychiatric: She has a normal mood and affect. Her behavior is normal.    ED Course  Procedures  (including critical care time)  Labs Reviewed  CBC WITH DIFFERENTIAL - Abnormal; Notable for the following:    RBC 3.48 (*)  Hemoglobin 9.4 (*)    HCT 28.5 (*)    Eosinophils Relative 8 (*)    All other components within normal limits  COMPREHENSIVE METABOLIC PANEL - Abnormal; Notable for the following:    Sodium 131 (*)    Chloride 94 (*)    Glucose, Bld 118 (*)    BUN 127 (*)    Creatinine, Ser 4.15 (*)    Albumin 3.0 (*)    ALT 48 (*)    Alkaline Phosphatase 121 (*)    Total Bilirubin 0.2 (*)    GFR calc non Af Amer 10 (*)    GFR calc Af Amer 12 (*)    All other components within normal limits  URINALYSIS, ROUTINE W REFLEX MICROSCOPIC - Abnormal; Notable for the following:    APPearance CLOUDY (*)    Hgb urine dipstick SMALL (*)    Leukocytes, UA LARGE (*)    All other components within normal limits  URINE MICROSCOPIC-ADD ON   No results found.   1. Acute renal failure   2. Urinary tract infection       MDM   Discussed with Dr Conley Rolls. Will see in ED and admit.        Loren Racer, MD 06/24/12 2154

## 2012-06-24 NOTE — Assessment & Plan Note (Signed)
Will follow up CBC  

## 2012-06-24 NOTE — Assessment & Plan Note (Signed)
Will follow up a1c

## 2012-06-25 DIAGNOSIS — I10 Essential (primary) hypertension: Secondary | ICD-10-CM

## 2012-06-25 LAB — BASIC METABOLIC PANEL
BUN: 125 mg/dL — ABNORMAL HIGH (ref 6–23)
Chloride: 101 mEq/L (ref 96–112)
Creatinine, Ser: 3.49 mg/dL — ABNORMAL HIGH (ref 0.50–1.10)
GFR calc Af Amer: 14 mL/min — ABNORMAL LOW (ref >90)
Glucose, Bld: 130 mg/dL — ABNORMAL HIGH (ref 70–99)
Potassium: 4.8 mEq/L (ref 3.5–5.1)

## 2012-06-25 LAB — CREATININE, SERUM
Creatinine, Ser: 3.78 mg/dL — ABNORMAL HIGH (ref 0.50–1.10)
GFR calc non Af Amer: 11 mL/min — ABNORMAL LOW (ref >90)

## 2012-06-25 LAB — GLUCOSE, CAPILLARY: Glucose-Capillary: 118 mg/dL — ABNORMAL HIGH (ref 70–99)

## 2012-06-25 LAB — CBC
Hemoglobin: 10.2 g/dL — ABNORMAL LOW (ref 12.0–15.0)
MCHC: 32.9 g/dL (ref 30.0–36.0)
Platelets: 268 10*3/uL (ref 150–400)
RBC: 3.73 MIL/uL — ABNORMAL LOW (ref 3.87–5.11)

## 2012-06-25 LAB — MRSA PCR SCREENING: MRSA by PCR: NEGATIVE

## 2012-06-25 LAB — SODIUM, URINE, RANDOM: Sodium, Ur: 52 mEq/L

## 2012-06-25 MED ORDER — SALINE SPRAY 0.65 % NA SOLN: 1.0000 | Freq: Four times a day (QID) | NASAL | Status: DC | PRN

## 2012-06-25 MED ORDER — ONDANSETRON HCL 4 MG/2ML IJ SOLN
4.0000 mg | Freq: Four times a day (QID) | INTRAMUSCULAR | Status: DC | PRN
  Administered 2012-06-25 – 2012-06-27 (×3): 4 mg via INTRAVENOUS
  Filled 2012-06-25 (×3): qty 2

## 2012-06-25 MED ORDER — BRINZOLAMIDE 1 % OP SUSP
1.0000 [drp] | Freq: Two times a day (BID) | OPHTHALMIC | Status: DC
  Administered 2012-06-25 – 2012-06-28 (×8): 1 [drp] via OPHTHALMIC
  Filled 2012-06-25: qty 10

## 2012-06-25 MED ORDER — BRINZOLAMIDE-BRIMONIDINE 1-0.2 % OP SUSP: 1.0000 [drp] | Freq: Two times a day (BID) | OPHTHALMIC | Status: DC

## 2012-06-25 MED ORDER — SODIUM CHLORIDE 0.9 % IV SOLN
250.0000 mg | Freq: Two times a day (BID) | INTRAVENOUS | Status: DC
  Administered 2012-06-25 – 2012-06-26 (×3): 250 mg via INTRAVENOUS
  Filled 2012-06-25 (×4): qty 250

## 2012-06-25 MED ORDER — ASPIRIN EC 325 MG PO TBEC
325.0000 mg | DELAYED_RELEASE_TABLET | Freq: Every day | ORAL | Status: DC
Start: 2012-06-25 — End: 2012-06-28
  Administered 2012-06-25 – 2012-06-28 (×4): 325 mg via ORAL
  Filled 2012-06-25 (×5): qty 1

## 2012-06-25 MED ORDER — IPRATROPIUM BROMIDE 0.02 % IN SOLN: 0.5000 mg | Freq: Four times a day (QID) | RESPIRATORY_TRACT | Status: DC | PRN

## 2012-06-25 MED ORDER — METOCLOPRAMIDE HCL 5 MG PO TABS
5.0000 mg | ORAL_TABLET | Freq: Three times a day (TID) | ORAL | Status: DC
  Administered 2012-06-25 – 2012-06-28 (×10): 5 mg via ORAL
  Filled 2012-06-25 (×12): qty 1

## 2012-06-25 MED ORDER — SODIUM CHLORIDE 0.9 % IV SOLN
INTRAVENOUS | Status: DC
  Administered 2012-06-25 – 2012-06-28 (×6): via INTRAVENOUS

## 2012-06-25 MED ORDER — ALBUTEROL SULFATE (5 MG/ML) 0.5% IN NEBU: 2.5000 mg | INHALATION_SOLUTION | Freq: Four times a day (QID) | RESPIRATORY_TRACT | Status: DC | PRN

## 2012-06-25 MED ORDER — DOCUSATE SODIUM 100 MG PO CAPS
100.0000 mg | ORAL_CAPSULE | Freq: Two times a day (BID) | ORAL | Status: DC
  Administered 2012-06-25 – 2012-06-27 (×6): 100 mg via ORAL
  Filled 2012-06-25 (×8): qty 1

## 2012-06-25 MED ORDER — BRIMONIDINE TARTRATE 0.2 % OP SOLN
1.0000 [drp] | Freq: Two times a day (BID) | OPHTHALMIC | Status: DC
  Administered 2012-06-25 – 2012-06-28 (×8): 1 [drp] via OPHTHALMIC
  Filled 2012-06-25: qty 5

## 2012-06-25 MED ORDER — ONDANSETRON HCL 4 MG PO TABS
4.0000 mg | ORAL_TABLET | Freq: Four times a day (QID) | ORAL | Status: DC | PRN
  Filled 2012-06-25: qty 1

## 2012-06-25 MED ORDER — GABAPENTIN 100 MG PO CAPS
100.0000 mg | ORAL_CAPSULE | Freq: Every day | ORAL | Status: DC
  Administered 2012-06-25 – 2012-06-27 (×4): 100 mg via ORAL
  Filled 2012-06-25 (×5): qty 1

## 2012-06-25 MED ORDER — HEPARIN SODIUM (PORCINE) 5000 UNIT/ML IJ SOLN
5000.0000 [IU] | Freq: Three times a day (TID) | INTRAMUSCULAR | Status: DC
  Administered 2012-06-25 – 2012-06-28 (×10): 5000 [IU] via SUBCUTANEOUS
  Filled 2012-06-25 (×13): qty 1

## 2012-06-25 MED ORDER — OXYCODONE HCL 5 MG PO TABS: 5.0000 mg | ORAL_TABLET | ORAL | Status: DC | PRN

## 2012-06-25 MED ORDER — POLYETHYLENE GLYCOL 3350 17 G PO PACK
17.0000 g | PACK | Freq: Every day | ORAL | Status: DC
  Administered 2012-06-25 – 2012-06-27 (×3): 17 g via ORAL
  Filled 2012-06-25 (×4): qty 1

## 2012-06-25 MED ORDER — SODIUM CHLORIDE 0.9 % IV SOLN
250.0000 mg | Freq: Once | INTRAVENOUS | Status: AC
  Administered 2012-06-25: 250 mg via INTRAVENOUS
  Filled 2012-06-25 (×2): qty 250

## 2012-06-25 MED ORDER — IPRATROPIUM-ALBUTEROL 0.5-2.5 (3) MG/3ML IN SOLN: 3.0000 mL | Freq: Four times a day (QID) | RESPIRATORY_TRACT | Status: DC | PRN

## 2012-06-25 MED ORDER — INSULIN GLARGINE 100 UNIT/ML ~~LOC~~ SOLN
10.0000 [IU] | Freq: Every day | SUBCUTANEOUS | Status: DC
  Administered 2012-06-25 – 2012-06-27 (×4): 10 [IU] via SUBCUTANEOUS
  Filled 2012-06-25 (×5): qty 0.1

## 2012-06-25 MED ORDER — HYDROCORTISONE 2.5 % RE CREA
1.0000 "application " | TOPICAL_CREAM | Freq: Two times a day (BID) | RECTAL | Status: DC
  Administered 2012-06-25 – 2012-06-28 (×7): 1 via RECTAL
  Filled 2012-06-25: qty 28.35

## 2012-06-25 MED ORDER — INSULIN ASPART 100 UNIT/ML ~~LOC~~ SOLN: 0.0000 [IU] | Freq: Every day | SUBCUTANEOUS | Status: DC

## 2012-06-25 MED ORDER — ACETAMINOPHEN 325 MG PO TABS: 650.0000 mg | ORAL_TABLET | Freq: Four times a day (QID) | ORAL | Status: DC | PRN

## 2012-06-25 MED ORDER — CARVEDILOL 3.125 MG PO TABS
3.1250 mg | ORAL_TABLET | Freq: Every day | ORAL | Status: DC
  Administered 2012-06-25 – 2012-06-28 (×4): 3.125 mg via ORAL
  Filled 2012-06-25 (×5): qty 1

## 2012-06-25 MED ORDER — INSULIN ASPART 100 UNIT/ML ~~LOC~~ SOLN
5.0000 [IU] | Freq: Three times a day (TID) | SUBCUTANEOUS | Status: DC
  Administered 2012-06-25: 5 [IU] via SUBCUTANEOUS

## 2012-06-25 MED ORDER — INSULIN ASPART 100 UNIT/ML ~~LOC~~ SOLN
0.0000 [IU] | Freq: Three times a day (TID) | SUBCUTANEOUS | Status: DC
  Administered 2012-06-25 – 2012-06-27 (×4): 1 [IU] via SUBCUTANEOUS
  Administered 2012-06-28: 2 [IU] via SUBCUTANEOUS
  Administered 2012-06-28: 1 [IU] via SUBCUTANEOUS

## 2012-06-25 NOTE — Progress Notes (Signed)
I was asked to see [name] following her transfer from [facility] to [facility] room [phone]. [name] is an 72 y/o female from a nursing home, bedbound, with hx of DM, HTN, blindness, prior TIA, recently treated for UTI with Bactrim (unfortunately culture grew Kleb Pneumo resistant to it, among many others). Pt presented to the ER at [facility] complaining of malaise. Evaluation in the ER showed that she is in ARF with Cr of 4, persistent UTI with UA showing 21-50 WBC and rare bacteria. She remained hemodynamically stable and was felt to be clinically volume depleted, with BUN of 127. She has been on diuretics. At bedside pt noted sleeping in NAD. RN reports pt was alert and oriented x upon her arrival. Current VSS. She is afebrile. Will continue to monitor closely.  Leanne Chang, NP-C [facility] Pager [phone]

## 2012-06-25 NOTE — Progress Notes (Signed)
Late entry for 0130. Patient admitted to room 6729 via [facility]. Patient alert and oriented x4. Patient denies any pain. Patient is blind. Patient oriented to unit. Call bell within reach. Bed in lowest position. Bed alarm on. Will continue to monitor.

## 2012-06-25 NOTE — ED Notes (Signed)
[facility] was notified of pt's transfer to [facility]. 

## 2012-06-25 NOTE — Progress Notes (Signed)
[facility] PROGRESS NOTE  [name] ZOX:[id number] DOB: 1940/11/12 DOA: 06/24/2012 PCP: No primary provider on file.  Assessment/Plan: 1. Acute renal failure on CKD stage 1: probably a combination of pre renal and renal causes in view of recent use of bactrim. She was started on flud, stopped her diuretics, changed her antibiotic. US renal does not show any hydronephrosis. Her creatinine has improved to 3. 49. Will order urine electrolytes and monitor output.   I/O last 3 completed shifts: In: 1253.3 [P.O.:100; I.V.:913.3; Other:240] Out: 800 [Urine:800] Total I/O In: 240 [P.O.:240] Out: 440 [Urine:440]   2. ESBL klebsiella: on primaxin. Repeat urine cultures are pending.   3. Diabetes Mellitus: resume lantus anD SSI.  CBG (last 3)   Recent Labs  06/25/12 0722 06/25/12 1146 06/25/12 1605  GLUCAP 129* 105* 99    4. Hypertension: better controlled.   5. DVT prophylaxis.  Code Status: full code Family Communication: none atbedside Disposition Plan: pending.    Consultants:  none  Procedures:  US renal  Antibiotics:  primaxin 5/27  HPI/Subjective: Comfortable.   Objective: Filed Vitals:   06/24/12 2328 06/25/12 0136 06/25/12 0457 06/25/12 1000  BP: 138/67 148/58 129/84 140/67  Pulse: 82 78 64 64  Temp: 98.1 F (36.7 C) 97.6 F (36.4 C) 97.7 F (36.5 C) 98 F (36.7 C)  TempSrc: Oral Oral Oral Oral  Resp: 20 20 20 20   Height:  5\' 2"  (1.575 m)    Weight:  91.2 kg (201 lb 1 oz)    SpO2: 100% 99% 100% 100%    Intake/Output Summary (Last 24 hours) at 06/25/12 1656 Last data filed at 06/25/12 1604  Gross per 24 hour  Intake 1493.33 ml  Output   1240 ml  Net 253.33 ml   Filed Weights   06/25/12 0136  Weight: 91.2 kg (201 lb 1 oz)    Exam:  HEART: Regular rate and rhythm. There are no murmur, rub, or gallops.  BACK: No kyphosis or scoliosis; no CVA tenderness  ABDOMEN: soft and non-tender; no masses, no organomegaly, normal abdominal  bowel sounds; no pannus; no intertriginous candida. There is no rebound and no distention.  EXTREMITIES: No bone or joint deformity; age-appropriate arthropathy of the hands and knees; no edema; no ulcerations. There is no calf tenderness. She is in AFO bilaterally.   Data Reviewed: Basic Metabolic Panel:  Recent Labs Lab 06/24/12 2015 06/25/12 0221 06/25/12 0950  NA 131*  --  136  K 5.0  --  4.8  CL 94*  --  101  CO2 21  --  18*  GLUCOSE 118*  --  130*  BUN 127*  --  125*  CREATININE 4.15* 3.78* 3.49*  CALCIUM 9.1  --  8.8   Liver Function Tests:  Recent Labs Lab 06/24/12 2015  AST 19  ALT 48*  ALKPHOS 121*  BILITOT 0.2*  PROT 8.1  ALBUMIN 3.0*   No results found for this basename: LIPASE, AMYLASE,  in the last 168 hours No results found for this basename: AMMONIA,  in the last 168 hours CBC:  Recent Labs Lab 06/24/12 2015 06/25/12 0221  WBC 8.3 8.3  NEUTROABS 5.6  --   HGB 9.4* 10.2*  HCT 28.5* 31.0*  MCV 81.9 83.1  PLT 287 268   Cardiac Enzymes: No results found for this basename: CKTOTAL, CKMB, CKMBINDEX, TROPONINI,  in the last 168 hours BNP (last 3 results)  Recent Labs  02/14/12 1045 02/17/12 1844  PROBNP 7013.0* 14005.0*  CBG:  Recent Labs Lab 06/24/12 2344 06/25/12 0139 06/25/12 0722 06/25/12 1146 06/25/12 1605  GLUCAP 101* 118* 129* 105* 99    Recent Results (from the past 240 hour(s))  URINE CULTURE     Status: None   Collection Time    06/18/12  1:27 AM      Result Value Range Status   Specimen Description URINE, CATHETERIZED   Final   Special Requests NONE   Final   Culture  Setup Time 06/18/2012 09:35   Final   Colony Count >=100,000 COLONIES/ML   Final   Culture     Final   Value: KLEBSIELLA PNEUMONIAE     Note: Confirmed Extended Spectrum Beta-Lactamase Producer (ESBL)   Report Status 06/21/2012 FINAL   Final   Organism ID, Bacteria KLEBSIELLA PNEUMONIAE   Final  MRSA PCR SCREENING     Status: None   Collection Time     06/25/12  1:45 AM      Result Value Range Status   MRSA by PCR NEGATIVE  NEGATIVE Final   Comment:            The GeneXpert MRSA Assay (FDA     approved for NASAL specimens     only), is one component of a     comprehensive MRSA colonization     surveillance program. It is not     intended to diagnose MRSA     infection nor to guide or     monitor treatment for     MRSA infections.     Studies: US Renal  06/25/2012   *RADIOLOGY REPORT*  Clinical Data: Urinary tract infection  RENAL/URINARY TRACT ULTRASOUND COMPLETE  Comparison: CT abdomen 05/20 and 01/2012  Findings:  Right Kidney = 11.2 cm.  Limited visibility due to body habitus. No hydronephrosis.  Left kidney = 12.0 cm.  Nonobstructing echogenic calculus within the mid kidney.  No hydronephrosis  Bladder:  Bladder partially distended.  No irregularity identified.  IMPRESSION:  No evidence of hydronephrosis.  Nephrolithiasis better on comparison CT.   Original Report Authenticated By: Genevive Bi, M.D.    Scheduled Meds: . aspirin EC  325 mg Oral Daily  . brinzolamide  1 drop Left Eye BID   And  . brimonidine  1 drop Left Eye BID  . carvedilol  3.125 mg Oral Q breakfast  . docusate sodium  100 mg Oral BID  . gabapentin  100 mg Oral QHS  . heparin  5,000 Units Subcutaneous Q8H  . hydrocortisone  1 application Rectal BID  . imipenem-cilastatin  250 mg Intravenous Q12H  . insulin aspart  0-5 Units Subcutaneous QHS  . insulin aspart  0-9 Units Subcutaneous TID WC  . insulin aspart  5 Units Subcutaneous TID AC  . insulin glargine  10 Units Subcutaneous QHS  . metoCLOPramide  5 mg Oral TID  . polyethylene glycol  17 g Oral Daily   Continuous Infusions: . sodium chloride 100 mL/hr at 06/25/12 0200    Principal Problem:   Acute renal failure Active Problems:   DM   BLINDNESS, BILATERAL   ESSENTIAL HYPERTENSION   Osteoarthritis of both knees    Time spent: 30 minutes.    [facility]  [facility] Pager [phone] If 7PM-7AM, please contact night-coverage at [email or web address], password [facility] 06/25/2012, 4:56 PM  LOS: 1 day

## 2012-06-25 NOTE — Progress Notes (Signed)
ANTIBIOTIC CONSULT NOTE - INITIAL  Pharmacy Consult for Primaxin Indication: UTI (K. pneumoniae ESBL-positive)  No Known Allergies  Patient Measurements: Height: 5\' 2"  (157.5 cm) Weight: 201 lb 1 oz (91.2 kg) (Per 06/13/12 documentation Simultaneous filing. User may not have seen previous data.) IBW/kg (Calculated) : 50.1  Vital Signs: Temp: 97.6 F (36.4 C) (05/28 0136) Temp src: Oral (05/28 0136) BP: 148/58 mmHg (05/28 0136) Pulse Rate: 78 (05/28 0136) Intake/Output from previous day: 05/27 0701 - 05/28 0700 In: 840 [P.O.:100; I.V.:500] Out: 800 [Urine:800] Intake/Output from this shift: Total I/O In: 840 [P.O.:100; I.V.:500; Other:240] Out: 800 [Urine:800]  Labs:  Recent Labs  06/24/12 2015  WBC 8.3  HGB 9.4*  PLT 287  CREATININE 4.15*   Estimated Creatinine Clearance: 13.2 ml/min (by C-G formula based on Cr of 4.15). No results found for this basename: VANCOTROUGH, Leodis Binet, VANCORANDOM, GENTTROUGH, GENTPEAK, GENTRANDOM, TOBRATROUGH, TOBRAPEAK, TOBRARND, AMIKACINPEAK, AMIKACINTROU, AMIKACIN,  in the last 72 hours   Microbiology: Recent Results (from the past 720 hour(s))  URINE CULTURE     Status: None   Collection Time    06/18/12  1:27 AM      Result Value Range Status   Specimen Description URINE, CATHETERIZED   Final   Special Requests NONE   Final   Culture  Setup Time 06/18/2012 09:35   Final   Colony Count >=100,000 COLONIES/ML   Final   Culture     Final   Value: KLEBSIELLA PNEUMONIAE     Note: Confirmed Extended Spectrum Beta-Lactamase Producer (ESBL)   Report Status 06/21/2012 FINAL   Final   Organism ID, Bacteria KLEBSIELLA PNEUMONIAE   Final    Medical History: Past Medical History  Diagnosis Date  . Hypertension   . Diabetes mellitus   . Blindness   . TIA (transient ischemic attack)   . Anemia   . GERD (gastroesophageal reflux disease)   . Renal disorder   . Constipation    Medications:  Scheduled:  . aspirin EC  325 mg Oral  Daily  . Brinzolamide-Brimonidine  1 drop Left Eye BID  . carvedilol  3.125 mg Oral Q breakfast  . docusate sodium  100 mg Oral BID  . gabapentin  100 mg Oral QHS  . heparin  5,000 Units Subcutaneous Q8H  . hydrocortisone  1 application Rectal BID  . insulin aspart  0-5 Units Subcutaneous QHS  . insulin aspart  0-9 Units Subcutaneous TID WC  . insulin aspart  5 Units Subcutaneous TID AC  . insulin glargine  10 Units Subcutaneous QHS  . metoCLOPramide  5 mg Oral TID  . polyethylene glycol  17 g Oral Daily   Assessment: 44 female presented with acute renal failure and UTI (K. pneumoniae ESBL-positive). Pharmacy consulted to manage Primaxin.  Plan:  1. Primaxin 250mg  IV Q12H.   Emeline Gins 06/25/2012,1:40 AM

## 2012-06-25 NOTE — Progress Notes (Signed)
UR COMPLETED  

## 2012-06-25 NOTE — ED Notes (Signed)
[facility] here to transport pt to [facility]. 

## 2012-06-26 DIAGNOSIS — H543 Unqualified visual loss, both eyes: Secondary | ICD-10-CM

## 2012-06-26 LAB — BASIC METABOLIC PANEL
Chloride: 104 mEq/L (ref 96–112)
GFR calc non Af Amer: 17 mL/min — ABNORMAL LOW (ref >90)
Glucose, Bld: 87 mg/dL (ref 70–99)
Potassium: 4.7 mEq/L (ref 3.5–5.1)
Sodium: 135 mEq/L (ref 135–145)

## 2012-06-26 LAB — URINE CULTURE: Colony Count: 35000

## 2012-06-26 LAB — GLUCOSE, CAPILLARY
Glucose-Capillary: 81 mg/dL (ref 70–99)
Glucose-Capillary: 121 mg/dL — ABNORMAL HIGH (ref 70–99)
Glucose-Capillary: 126 mg/dL — ABNORMAL HIGH (ref 70–99)
Glucose-Capillary: 120 mg/dL — ABNORMAL HIGH (ref 70–99)

## 2012-06-26 MED ORDER — SODIUM CHLORIDE 0.9 % IV SOLN
250.0000 mg | Freq: Four times a day (QID) | INTRAVENOUS | Status: DC
  Administered 2012-06-26 – 2012-06-28 (×8): 250 mg via INTRAVENOUS
  Filled 2012-06-26 (×14): qty 250

## 2012-06-26 NOTE — Progress Notes (Signed)
[facility] PROGRESS NOTE  [name] MVH:[id number] DOB: July 18, 1940 DOA: 06/24/2012 PCP: No primary provider on file.  Assessment/Plan: 1. Acute renal failure on CKD stage 1: probably a combination of pre renal and renal causes in view of recent use of bactrim. She was started on flud, stopped her diuretics, changed her antibiotic. US renal does not show any hydronephrosis. Her creatinine has improved to 2.7. Will order urine electrolytes and monitor output.   I/O last 3 completed shifts: In: 3610 [P.O.:370; I.V.:2800; Other:240; IV Piggyback:200] Out: 1440 [Urine:1440] Total I/O In: 1200 [I.V.:1200] Out: -    2. ESBL klebsiella: on primaxin. Repeat urine cultures are pending.   3. Diabetes Mellitus: resume lantus anD SSI.  CBG (last 3)   Recent Labs  06/25/12 2123 06/26/12 0746 06/26/12 1158  GLUCAP 116* 81 121*    4. Hypertension: better controlled.   5. DVT prophylaxis.  Code Status: full code Family Communication: none atbedside Disposition Plan: pending.    Consultants:  none  Procedures:  US renal  Antibiotics:  primaxin 5/27  HPI/Subjective: Comfortable.   Objective: Filed Vitals:   06/25/12 1800 06/25/12 1946 06/26/12 0453 06/26/12 1017  BP: 158/78 139/59 143/67 128/61  Pulse: 65 64 66 71  Temp: 97.8 F (36.6 C) 98.5 F (36.9 C) 97.7 F (36.5 C) 97.9 F (36.6 C)  TempSrc: Oral Oral Oral Oral  Resp: 18 18 18 18   Height:      Weight:  96.208 kg (212 lb 1.6 oz)    SpO2: 100% 100% 99% 98%    Intake/Output Summary (Last 24 hours) at 06/26/12 1600 Last data filed at 06/26/12 1300  Gross per 24 hour  Intake 3216.67 ml  Output    440 ml  Net 2776.67 ml   Filed Weights   06/25/12 0136 06/25/12 1946  Weight: 91.2 kg (201 lb 1 oz) 96.208 kg (212 lb 1.6 oz)    Exam:  HEART: Regular rate and rhythm. There are no murmur, rub, or gallops.  BACK: No kyphosis or scoliosis; no CVA tenderness  ABDOMEN: soft and non-tender; no masses,  no organomegaly, normal abdominal bowel sounds; no pannus; no intertriginous candida. There is no rebound and no distention.  EXTREMITIES: No bone or joint deformity; age-appropriate arthropathy of the hands and knees; no edema; no ulcerations. There is no calf tenderness. She is in AFO bilaterally.   Data Reviewed: Basic Metabolic Panel:  Recent Labs Lab 06/24/12 2015 06/25/12 0221 06/25/12 0950 06/26/12 0600  NA 131*  --  136 135  K 5.0  --  4.8 4.7  CL 94*  --  101 104  CO2 21  --  18* 17*  GLUCOSE 118*  --  130* 87  BUN 127*  --  125* 101*  CREATININE 4.15* 3.78* 3.49* 2.70*  CALCIUM 9.1  --  8.8 8.8   Liver Function Tests:  Recent Labs Lab 06/24/12 2015  AST 19  ALT 48*  ALKPHOS 121*  BILITOT 0.2*  PROT 8.1  ALBUMIN 3.0*   No results found for this basename: LIPASE, AMYLASE,  in the last 168 hours No results found for this basename: AMMONIA,  in the last 168 hours CBC:  Recent Labs Lab 06/24/12 2015 06/25/12 0221  WBC 8.3 8.3  NEUTROABS 5.6  --   HGB 9.4* 10.2*  HCT 28.5* 31.0*  MCV 81.9 83.1  PLT 287 268   Cardiac Enzymes: No results found for this basename: CKTOTAL, CKMB, CKMBINDEX, TROPONINI,  in the last 168 hours BNP (  last 3 results)  Recent Labs  02/14/12 1045 02/17/12 1844  PROBNP 7013.0* 14005.0*   CBG:  Recent Labs Lab 06/25/12 1146 06/25/12 1605 06/25/12 2123 06/26/12 0746 06/26/12 1158  GLUCAP 105* 99 116* 81 121*    Recent Results (from the past 240 hour(s))  URINE CULTURE     Status: None   Collection Time    06/18/12  1:27 AM      Result Value Range Status   Specimen Description URINE, CATHETERIZED   Final   Special Requests NONE   Final   Culture  Setup Time 06/18/2012 09:35   Final   Colony Count >=100,000 COLONIES/ML   Final   Culture     Final   Value: KLEBSIELLA PNEUMONIAE     Note: Confirmed Extended Spectrum Beta-Lactamase Producer (ESBL)   Report Status 06/21/2012 FINAL   Final   Organism ID, Bacteria  KLEBSIELLA PNEUMONIAE   Final  MRSA PCR SCREENING     Status: None   Collection Time    06/25/12  1:45 AM      Result Value Range Status   MRSA by PCR NEGATIVE  NEGATIVE Final   Comment:            The GeneXpert MRSA Assay (FDA     approved for NASAL specimens     only), is one component of a     comprehensive MRSA colonization     surveillance program. It is not     intended to diagnose MRSA     infection nor to guide or     monitor treatment for     MRSA infections.  URINE CULTURE     Status: None   Collection Time    06/25/12 10:00 AM      Result Value Range Status   Specimen Description URINE, RANDOM   Final   Special Requests NONE   Final   Culture  Setup Time 06/25/2012 17:07   Final   Colony Count 35,000 COLONIES/ML   Final   Culture     Final   Value: Multiple bacterial morphotypes present, none predominant. Suggest appropriate recollection if clinically indicated.   Report Status 06/26/2012 FINAL   Final     Studies: US Renal  06/25/2012   *RADIOLOGY REPORT*  Clinical Data: Urinary tract infection  RENAL/URINARY TRACT ULTRASOUND COMPLETE  Comparison: CT abdomen 05/20 and 01/2012  Findings:  Right Kidney = 11.2 cm.  Limited visibility due to body habitus. No hydronephrosis.  Left kidney = 12.0 cm.  Nonobstructing echogenic calculus within the mid kidney.  No hydronephrosis  Bladder:  Bladder partially distended.  No irregularity identified.  IMPRESSION:  No evidence of hydronephrosis.  Nephrolithiasis better on comparison CT.   Original Report Authenticated By: Genevive Bi, M.D.    Scheduled Meds: . aspirin EC  325 mg Oral Daily  . brinzolamide  1 drop Left Eye BID   And  . brimonidine  1 drop Left Eye BID  . carvedilol  3.125 mg Oral Q breakfast  . docusate sodium  100 mg Oral BID  . gabapentin  100 mg Oral QHS  . heparin  5,000 Units Subcutaneous Q8H  . hydrocortisone  1 application Rectal BID  . imipenem-cilastatin  250 mg Intravenous Q6H  . insulin aspart   0-5 Units Subcutaneous QHS  . insulin aspart  0-9 Units Subcutaneous TID WC  . insulin glargine  10 Units Subcutaneous QHS  . metoCLOPramide  5 mg Oral TID  . polyethylene glycol  17 g Oral Daily   Continuous Infusions: . sodium chloride 100 mL/hr at 06/26/12 1300    Principal Problem:   Acute renal failure Active Problems:   DM   BLINDNESS, BILATERAL   ESSENTIAL HYPERTENSION   Osteoarthritis of both knees    Time spent: 30 minutes.    [facility]  [facility] Pager [phone] If 7PM-7AM, please contact night-coverage at [email or web address], password [facility] 06/26/2012, 4:00 PM  LOS: 2 days

## 2012-06-26 NOTE — Progress Notes (Signed)
PT Cancellation Note/Discharge  Patient Details Name: [name] MRN: [id number] DOB: 12/20/1940   Cancelled Treatment:    Reason Eval/Treat Not Completed: Other (comment) (Pt bedbound and uses lift at [facility] Not appropriate for PT.   [facility] 06/26/2012, 3:33 PM

## 2012-06-27 LAB — BASIC METABOLIC PANEL
Calcium: 8.7 mg/dL (ref 8.4–10.5)
Chloride: 107 mEq/L (ref 96–112)
Creatinine, Ser: 2.03 mg/dL — ABNORMAL HIGH (ref 0.50–1.10)
GFR calc Af Amer: 27 mL/min — ABNORMAL LOW (ref >90)
Sodium: 137 mEq/L (ref 135–145)

## 2012-06-27 MED ORDER — SODIUM BICARBONATE 8.4 % IV SOLN
INTRAVENOUS | Status: DC
  Administered 2012-06-27: 09:00:00 via INTRAVENOUS
  Filled 2012-06-27 (×5): qty 50

## 2012-06-27 NOTE — Progress Notes (Signed)
[facility] PROGRESS NOTE  [name] AOZ:[id number] DOB: 1941-01-07 DOA: 06/24/2012 PCP: No primary provider on file.  Assessment/Plan: 1. Acute renal failure on CKD stage 1: probably a combination of pre renal and renal causes in view of recent use of bactrim. She was started on fluid, stopped her diuretics, changed her antibiotic. US renal does not show any hydronephrosis. Her creatinine has improved to 2.7. Will order urine electrolytes and monitor output.   I/O last 3 completed shifts: In: 4266.7 [P.O.:300; I.V.:3766.7; IV Piggyback:200] Out: 200 [Urine:200] Total I/O In: 1530 [P.O.:360; I.V.:1170] Out: -    2. ESBL klebsiella: on primaxin. Repeat urine cultures grew 35,000 multiple bacterial morphotypes, she is asymptomatic. We will complete the 5 day course of the antibiotics  3. Diabetes Mellitus: resume lantus anD SSI.  CBG (last 3)   Recent Labs  06/27/12 0742 06/27/12 1143 06/27/12 1618  GLUCAP 99 111* 143*    4. Hypertension: better controlled.   5. DVT prophylaxis.  Code Status: full code Family Communication: none atbedside Disposition Plan: pending.    Consultants:  none  Procedures:  US renal  Antibiotics:  primaxin 5/27  HPI/Subjective: Comfortable.   Objective: Filed Vitals:   06/26/12 2200 06/27/12 0600 06/27/12 1000 06/27/12 1756  BP: 123/43 144/63 136/53 140/60  Pulse: 66 67 65 75  Temp: 97.8 F (36.6 C) 98.4 F (36.9 C) 98.2 F (36.8 C) 98.6 F (37 C)  TempSrc: Oral Oral Oral Oral  Resp: 18 18 18 18   Height:      Weight: 95.301 kg (210 lb 1.6 oz)     SpO2: 100% 100% 100% 100%    Intake/Output Summary (Last 24 hours) at 06/27/12 1833 Last data filed at 06/27/12 1700  Gross per 24 hour  Intake   3130 ml  Output      0 ml  Net   3130 ml   Filed Weights   06/25/12 0136 06/25/12 1946 06/26/12 2200  Weight: 91.2 kg (201 lb 1 oz) 96.208 kg (212 lb 1.6 oz) 95.301 kg (210 lb 1.6 oz)    Exam:  HEART: Regular rate  and rhythm. There are no murmur, rub, or gallops.  BACK: No kyphosis or scoliosis; no CVA tenderness  ABDOMEN: soft and non-tender; no masses, no organomegaly, normal abdominal bowel sounds; no pannus; no intertriginous candida. There is no rebound and no distention.  EXTREMITIES: No bone or joint deformity; age-appropriate arthropathy of the hands and knees; no edema; no ulcerations. There is no calf tenderness. She is in AFO bilaterally.   Data Reviewed: Basic Metabolic Panel:  Recent Labs Lab 06/24/12 2015 06/25/12 0221 06/25/12 0950 06/26/12 0600 06/27/12 0635  NA 131*  --  136 135 137  K 5.0  --  4.8 4.7 4.9  CL 94*  --  101 104 107  CO2 21  --  18* 17* 17*  GLUCOSE 118*  --  130* 87 92  BUN 127*  --  125* 101* 74*  CREATININE 4.15* 3.78* 3.49* 2.70* 2.03*  CALCIUM 9.1  --  8.8 8.8 8.7   Liver Function Tests:  Recent Labs Lab 06/24/12 2015  AST 19  ALT 48*  ALKPHOS 121*  BILITOT 0.2*  PROT 8.1  ALBUMIN 3.0*   No results found for this basename: LIPASE, AMYLASE,  in the last 168 hours No results found for this basename: AMMONIA,  in the last 168 hours CBC:  Recent Labs Lab 06/24/12 2015 06/25/12 0221  WBC 8.3 8.3  NEUTROABS 5.6  --  HGB 9.4* 10.2*  HCT 28.5* 31.0*  MCV 81.9 83.1  PLT 287 268   Cardiac Enzymes: No results found for this basename: CKTOTAL, CKMB, CKMBINDEX, TROPONINI,  in the last 168 hours BNP (last 3 results)  Recent Labs  02/14/12 1045 02/17/12 1844  PROBNP 7013.0* 14005.0*   CBG:  Recent Labs Lab 06/26/12 1658 06/26/12 2202 06/27/12 0742 06/27/12 1143 06/27/12 1618  GLUCAP 126* 120* 99 111* 143*    Recent Results (from the past 240 hour(s))  URINE CULTURE     Status: None   Collection Time    06/18/12  1:27 AM      Result Value Range Status   Specimen Description URINE, CATHETERIZED   Final   Special Requests NONE   Final   Culture  Setup Time 06/18/2012 09:35   Final   Colony Count >=100,000 COLONIES/ML   Final    Culture     Final   Value: KLEBSIELLA PNEUMONIAE     Note: Confirmed Extended Spectrum Beta-Lactamase Producer (ESBL)   Report Status 06/21/2012 FINAL   Final   Organism ID, Bacteria KLEBSIELLA PNEUMONIAE   Final  MRSA PCR SCREENING     Status: None   Collection Time    06/25/12  1:45 AM      Result Value Range Status   MRSA by PCR NEGATIVE  NEGATIVE Final   Comment:            The GeneXpert MRSA Assay (FDA     approved for NASAL specimens     only), is one component of a     comprehensive MRSA colonization     surveillance program. It is not     intended to diagnose MRSA     infection nor to guide or     monitor treatment for     MRSA infections.  URINE CULTURE     Status: None   Collection Time    06/25/12 10:00 AM      Result Value Range Status   Specimen Description URINE, RANDOM   Final   Special Requests NONE   Final   Culture  Setup Time 06/25/2012 17:07   Final   Colony Count 35,000 COLONIES/ML   Final   Culture     Final   Value: Multiple bacterial morphotypes present, none predominant. Suggest appropriate recollection if clinically indicated.   Report Status 06/26/2012 FINAL   Final     Studies: No results found.  Scheduled Meds: . aspirin EC  325 mg Oral Daily  . brinzolamide  1 drop Left Eye BID   And  . brimonidine  1 drop Left Eye BID  . carvedilol  3.125 mg Oral Q breakfast  . docusate sodium  100 mg Oral BID  . gabapentin  100 mg Oral QHS  . heparin  5,000 Units Subcutaneous Q8H  . hydrocortisone  1 application Rectal BID  . imipenem-cilastatin  250 mg Intravenous Q6H  . insulin aspart  0-5 Units Subcutaneous QHS  . insulin aspart  0-9 Units Subcutaneous TID WC  . insulin glargine  10 Units Subcutaneous QHS  . metoCLOPramide  5 mg Oral TID  . polyethylene glycol  17 g Oral Daily   Continuous Infusions: . sodium chloride 100 mL/hr at 06/27/12 1209  .  sodium bicarbonate  infusion 1000 mL 100 mL/hr at 06/27/12 4098    Principal Problem:    Acute renal failure Active Problems:   DM   BLINDNESS, BILATERAL   ESSENTIAL HYPERTENSION  Osteoarthritis of both knees    Time spent: 30 minutes.    [facility]  [facility] Pager [phone] If 7PM-7AM, please contact night-coverage at [email or web address], password [facility] 06/27/2012, 6:33 PM  LOS: 3 days

## 2012-06-28 LAB — BASIC METABOLIC PANEL
CO2: 18 mEq/L — ABNORMAL LOW (ref 19–32)
Calcium: 8.5 mg/dL (ref 8.4–10.5)
Chloride: 110 mEq/L (ref 96–112)
Creatinine, Ser: 1.55 mg/dL — ABNORMAL HIGH (ref 0.50–1.10)
GFR calc Af Amer: 38 mL/min — ABNORMAL LOW (ref >90)
Sodium: 139 mEq/L (ref 135–145)

## 2012-06-28 LAB — GLUCOSE, CAPILLARY
Glucose-Capillary: 140 mg/dL — ABNORMAL HIGH (ref 70–99)
Glucose-Capillary: 133 mg/dL — ABNORMAL HIGH (ref 70–99)

## 2012-06-28 MED ORDER — FUROSEMIDE 20 MG PO TABS: 20.0000 mg | ORAL_TABLET | Freq: Two times a day (BID) | ORAL | Status: DC

## 2012-06-28 MED ORDER — FUROSEMIDE 20 MG PO TABS: 20.0000 mg | ORAL_TABLET | Freq: Every day | ORAL | Status: DC

## 2012-06-28 MED ORDER — FUROSEMIDE 40 MG PO TABS: 40.0000 mg | ORAL_TABLET | Freq: Every day | ORAL | Status: DC

## 2012-06-28 NOTE — Clinical Social Work Psychosocial (Signed)
Clinical Social Work Department BRIEF PSYCHOSOCIAL ASSESSMENT 06/28/2012  Patient:  [name], [name]     Account Number:  [id number]     Admit date:  06/24/2012  Clinical Social Worker:  Delmer Islam  Date/Time:  06/28/2012 08:29 AM  Referred by:  Physician  Date Referred:  06/27/2012 Referred for  SNF Placement   Other Referral:   Interview type:  Other - See comment Other interview type:   CSW talked with nurse liaison, Reyne Dumas and DSS Guardian SW [place] [facility] [phone]).    PSYCHOSOCIAL DATA Living Status:  FACILITY Admitted from facility:  [facility] Level of care:  Skilled Nursing Facility Primary support name:  Lyndel Safe Primary support relationship to patient:  CHILD, ADULT Degree of support available:   Unknown. Ms. Henriette Combs phone number is [phone].    CURRENT CONCERNS Current Concerns  Post-Acute Placement   Other Concerns:    SOCIAL WORK ASSESSMENT / PLAN On 5/30, CSW advised by MD that patient ready for d/c back to facility on Saturday, 5/31. RN nurse liaison Tammy contacted and advised and patient can return to facility on Saturday. CSW also talked with DSS Guardian SW Demetria Hairston and advised of patient's readiness to discharge. When asked, Mrs. Jenelle Mages advised that patient's daughter is involved.   Assessment/plan status:  No Further Intervention Required Other assessment/ plan:   Information/referral to community resources:    PATIENT'S/FAMILY'S RESPONSE TO PLAN OF CARE: Patient will be discharged back to [facility] today, 5/31 via ambulance. Patient advised and daughter will be contacted.

## 2012-06-28 NOTE — Discharge Summary (Signed)
Physician Discharge Summary  [name] HYQ:[id number] DOB: 1940/08/23 DOA: 06/24/2012  PCP: No primary provider on file.  Admit date: 06/24/2012 Discharge date: 06/28/2012  Time spent: 40 minutes  Recommendations for Outpatient Follow-up:  1. Please check BMP tomorrow 2. Please follow upwith PCP in one week.   Discharge Diagnoses:  Principal Problem:   Acute renal failure Active Problems:   DM   BLINDNESS, BILATERAL   ESSENTIAL HYPERTENSION   Osteoarthritis of both knees   Discharge Condition: improved.   Diet recommendation: carb modified   Filed Weights   06/25/12 1946 06/26/12 2200 06/27/12 2139  Weight: 96.208 kg (212 lb 1.6 oz) 95.301 kg (210 lb 1.6 oz) 99.338 kg (219 lb)    History of present illness:  [name] is an 72 y.o. female nursing home resident, bedbound, with hx of DM, HTN, blindness, prior TIA, recently treated for UTI with Bactrim (unfortunately culture grew Kleb Pneumo resistant to it, among many others), presents to the ER at [facility] only complaining of feeling malaise. Evaluation in the ER showed that she is in ARF with Cr of 4, and persistent UTI with UA showing 21-50 WBC and rare bacteria. She remained hemodynamically stable, appears clinically volume depleted, with BUN of 127. She has been on diuretics. Hospitalist was asked to admit her for UTI, AKI, malaise, and volume depletion. Her diuretics were stopped and she was started on IV fluids. A repeat UA and urine cultures were obtained, grew 35,000 colonies with multiple bac morphotype, she was started on primaxin and completed a 5 day course. She was also in metabolic acidosis from the renal failure. We gave her bicarbonate infusions and her bicarb improved a little. She is recommended to decreased the dose of lasix to 20 mg daily and check bmp tomorrow and also follow upw ith PCP in one week.    Hospital Course:   Acute renal failure on CKD stage 1: probably a combination of pre renal and renal causes  in view of recent use of bactrim. She was started on fluid, stopped her diuretics, changed her antibiotic to primaxin. US renal does not show any hydronephrosis. Her creatinine has improved to 1.5 I/O last 3 completed shifts: In: 4083.3 [P.O.:660; I.V.:3223.3; IV Piggyback:200] Out: -     Patient does not know why she was on lasix, on further reviewing her records. She was found to have mild diastolic dysfunction, and was started on BID lasix in January this year. We will decrease the dose of lasix to 20 mg daily.  Recommend follow up with PCP in one week and check BMP tomorrow.  2. ESBL klebsiella: on primaxin. Repeat urine cultures grew 35,000 multiple bacterial morphotypes, she is asymptomatic. We have completed the 5 day course of the antibiotics   3. Diabetes Mellitus: resume lantus anD SSI.  CBG (last 3)   Recent Labs   06/27/12 0742  06/27/12 1143  06/27/12 1618   GLUCAP  99  111*  143*    4. Hypertension: better controlled. Resume home medications except for the lasix.   5. Chronic diastolic heart failure; she appears compensated.     Procedures:  none  Consultations:  none  Discharge Exam: Filed Vitals:   06/27/12 1756 06/27/12 2139 06/28/12 0552 06/28/12 0924  BP: 140/60 139/54 152/63 137/83  Pulse: 75 71 67 70  Temp: 98.6 F (37 C) 98.1 F (36.7 C) 98.6 F (37 C) 98.1 F (36.7 C)  TempSrc: Oral Oral Oral Oral  Resp: 18 18 18  18  Height:      Weight:  99.338 kg (219 lb)    SpO2: 100% 100% 100% 100%   HEART: Regular rate and rhythm. There are no murmur, rub, or gallops.  BACK: No kyphosis or scoliosis; no CVA tenderness  ABDOMEN: soft and non-tender; no masses, no organomegaly, normal abdominal bowel sounds; no pannus; no intertriginous candida. There is no rebound and no distention.  EXTREMITIES: No bone or joint deformity; age-appropriate arthropathy of the hands and knees; no edema; no ulcerations. There is no calf tenderness. She is in AFO  bilaterally.   Discharge Instructions  Discharge Orders   Future Orders Complete By Expires     Discharge instructions  As directed     Comments:      Please obtain a bmp in am to check creatinine.  We have decreased your lasix to once daily.        Medication List    STOP taking these medications       sulfamethoxazole-trimethoprim 800-160 MG per tablet  Commonly known as:  BACTRIM DS,SEPTRA DS      TAKE these medications       acetaminophen 325 MG tablet  Commonly known as:  TYLENOL  Take 2 tablets (650 mg total) by mouth every 6 (six) hours as needed (or Fever >/= 101).     aspirin EC 325 MG tablet  Take 325 mg by mouth daily.     carvedilol 3.125 MG tablet  Commonly known as:  COREG  Take 1 tablet (3.125 mg total) by mouth daily with breakfast.     docusate sodium 100 MG capsule  Commonly known as:  COLACE  Take 100 mg by mouth 2 (two) times daily.     DUONEB 0.5-2.5 (3) MG/3ML Soln  Generic drug:  ipratropium-albuterol  Take 3 mLs by nebulization every 6 (six) hours as needed. wheezing     furosemide 40 MG tablet  Commonly known as:  LASIX  Take 1 tablet (40 mg total) by mouth daily.     gabapentin 100 MG capsule  Commonly known as:  NEURONTIN  Take 100 mg by mouth at bedtime.     hydrocortisone 2.5 % rectal cream  Commonly known as:  ANUSOL-HC  Place 1 application rectally 2 (two) times daily.     insulin aspart 100 UNIT/ML injection  Commonly known as:  novoLOG  Inject 5 Units into the skin 3 (three) times daily before meals.     insulin glargine 100 UNIT/ML injection  Commonly known as:  LANTUS  Inject 10 Units into the skin at bedtime.     metoCLOPramide 5 MG tablet  Commonly known as:  REGLAN  Take 5 mg by mouth 3 (three) times daily.     multivitamin with minerals tablet  Take 1 tablet by mouth daily.     oxyCODONE 5 MG immediate release tablet  Commonly known as:  Oxy IR/ROXICODONE  Take one tablet every 4 hours as needed for pain      polyethylene glycol packet  Commonly known as:  MIRALAX / GLYCOLAX  Take 17 g by mouth daily.     SIMBRINZA 1-0.2 % Susp  Generic drug:  Brinzolamide-Brimonidine  Place 1 drop into the left eye 2 (two) times daily.     sodium chloride 0.65 % Soln nasal spray  Commonly known as:  OCEAN  Place 1 spray into the nose 4 (four) times daily as needed for congestion.       No Known Allergies  The results of significant diagnostics from this hospitalization (including imaging, microbiology, ancillary and laboratory) are listed below for reference.    Significant Diagnostic Studies: Ct Abdomen Pelvis W Contrast  06/18/2012   *RADIOLOGY REPORT*  Clinical Data: Vaginal bleeding.  CT ABDOMEN AND PELVIS WITH CONTRAST  Technique:  Multidetector CT imaging of the abdomen and pelvis was performed following the standard protocol during bolus administration of intravenous contrast.  Contrast: OMNIPAQUE IOHEXOL 300 MG/ML  SOLN  Comparison: 06/26/2011  Findings: The lung bases are clear except for right basilar scarring changes.  The heart is mildly enlarged.  Coronary artery calcifications are noted.  There is a small hiatal hernia noted.  The liver is unremarkable.  No focal hepatic lesions or intrahepatic biliary dilatation.  A calcified granuloma is noted in the left lobe.  The gallbladder is surgically absent.  No common bile duct dilatation.  The pancreas is unremarkable.  The spleen is normal in size.  No focal lesions.  The adrenal glands and kidneys appear stable.  There are bilateral renal calculi and renal scarring changes but no hydronephrosis or obstructing ureteral calculi.  No bladder calculi.  The stomach, duodenum, small bowel and colon are unremarkable.  The appendix is normal.  No mesenteric or retroperitoneal mass or adenopathy.  The aorta is normal in caliber.  Moderate atherosclerotic calcifications involving the aorta, branch vessels and iliac arteries.  The bladder is mildly  distended.  The uterus appears stable. Suspect fundal fibroids but no obvious endometrial or cervical mass.  There is moderate fecal impaction in the left with mild rectal wall thickening.  No pelvic mass, adenopathy or free pelvic fluid collections.  No inguinal mass or adenopathy.  The bony structures are intact.  There are degenerative changes involving the hips, SI joints and spine.  IMPRESSION:  1.  No acute abdominal/pelvic findings, mass lesions or adenopathy. 2.  Bilateral renal calculi and renal scarring changes. 3.  Slightly enlarged uterus with suspected fibroids unchanged since prior study.  No endometrial or adnexal mass is identified. 4.  Mild fecal impaction in the rectum.   Original Report Authenticated By: Rudie Meyer, M.D.   US Renal  06/25/2012   *RADIOLOGY REPORT*  Clinical Data: Urinary tract infection  RENAL/URINARY TRACT ULTRASOUND COMPLETE  Comparison: CT abdomen 05/20 and 01/2012  Findings:  Right Kidney = 11.2 cm.  Limited visibility due to body habitus. No hydronephrosis.  Left kidney = 12.0 cm.  Nonobstructing echogenic calculus within the mid kidney.  No hydronephrosis  Bladder:  Bladder partially distended.  No irregularity identified.  IMPRESSION:  No evidence of hydronephrosis.  Nephrolithiasis better on comparison CT.   Original Report Authenticated By: Genevive Bi, M.D.    Microbiology: Recent Results (from the past 240 hour(s))  MRSA PCR SCREENING     Status: None   Collection Time    06/25/12  1:45 AM      Result Value Range Status   MRSA by PCR NEGATIVE  NEGATIVE Final   Comment:            The GeneXpert MRSA Assay (FDA     approved for NASAL specimens     only), is one component of a     comprehensive MRSA colonization     surveillance program. It is not     intended to diagnose MRSA     infection nor to guide or     monitor treatment for     MRSA infections.  URINE CULTURE  Status: None   Collection Time    06/25/12 10:00 AM      Result Value  Range Status   Specimen Description URINE, RANDOM   Final   Special Requests NONE   Final   Culture  Setup Time 06/25/2012 17:07   Final   Colony Count 35,000 COLONIES/ML   Final   Culture     Final   Value: Multiple bacterial morphotypes present, none predominant. Suggest appropriate recollection if clinically indicated.   Report Status 06/26/2012 FINAL   Final     Labs: Basic Metabolic Panel:  Recent Labs Lab 06/24/12 2015 06/25/12 0221 06/25/12 0950 06/26/12 0600 06/27/12 0635 06/28/12 0600  NA 131*  --  136 135 137 139  K 5.0  --  4.8 4.7 4.9 4.8  CL 94*  --  101 104 107 110  CO2 21  --  18* 17* 17* 18*  GLUCOSE 118*  --  130* 87 92 150*  BUN 127*  --  125* 101* 74* 57*  CREATININE 4.15* 3.78* 3.49* 2.70* 2.03* 1.55*  CALCIUM 9.1  --  8.8 8.8 8.7 8.5   Liver Function Tests:  Recent Labs Lab 06/24/12 2015  AST 19  ALT 48*  ALKPHOS 121*  BILITOT 0.2*  PROT 8.1  ALBUMIN 3.0*   No results found for this basename: LIPASE, AMYLASE,  in the last 168 hours No results found for this basename: AMMONIA,  in the last 168 hours CBC:  Recent Labs Lab 06/24/12 2015 06/25/12 0221  WBC 8.3 8.3  NEUTROABS 5.6  --   HGB 9.4* 10.2*  HCT 28.5* 31.0*  MCV 81.9 83.1  PLT 287 268   Cardiac Enzymes: No results found for this basename: CKTOTAL, CKMB, CKMBINDEX, TROPONINI,  in the last 168 hours BNP: BNP (last 3 results)  Recent Labs  02/14/12 1045 02/17/12 1844  PROBNP 7013.0* 14005.0*   CBG:  Recent Labs Lab 06/27/12 0742 06/27/12 1143 06/27/12 1618 06/27/12 2142 06/28/12 0826  GLUCAP 99 111* 143* 140* 133*       Signed:  Kamaryn Grimley  [facility] 06/28/2012, 11:59 AM

## 2012-06-28 NOTE — Clinical Social Work Note (Signed)
Patient medically stable for discharge back to [facility] today. Discharge information forwarded to facility and patient's medical packet compiled and will accompany her to facility. [name] will be transported back to SNF by ambulance.  [name], MSW, LCSW [phone]

## 2012-07-02 ENCOUNTER — Non-Acute Institutional Stay (SKILLED_NURSING_FACILITY): Payer: Medicare Other | Admitting: Internal Medicine

## 2012-07-02 ENCOUNTER — Encounter: Payer: Self-pay | Admitting: Internal Medicine

## 2012-07-02 DIAGNOSIS — E1149 Type 2 diabetes mellitus with other diabetic neurological complication: Secondary | ICD-10-CM

## 2012-07-02 DIAGNOSIS — G609 Hereditary and idiopathic neuropathy, unspecified: Secondary | ICD-10-CM

## 2012-07-02 DIAGNOSIS — IMO0002 Reserved for concepts with insufficient information to code with codable children: Secondary | ICD-10-CM

## 2012-07-02 DIAGNOSIS — G629 Polyneuropathy, unspecified: Secondary | ICD-10-CM

## 2012-07-02 DIAGNOSIS — I10 Essential (primary) hypertension: Secondary | ICD-10-CM

## 2012-07-02 DIAGNOSIS — M170 Bilateral primary osteoarthritis of knee: Secondary | ICD-10-CM

## 2012-07-02 DIAGNOSIS — D649 Anemia, unspecified: Secondary | ICD-10-CM

## 2012-07-02 DIAGNOSIS — M1710 Unilateral primary osteoarthritis, unspecified knee: Secondary | ICD-10-CM

## 2012-07-02 DIAGNOSIS — N390 Urinary tract infection, site not specified: Secondary | ICD-10-CM

## 2012-07-02 DIAGNOSIS — N179 Acute kidney failure, unspecified: Secondary | ICD-10-CM

## 2012-07-02 DIAGNOSIS — E785 Hyperlipidemia, unspecified: Secondary | ICD-10-CM

## 2012-07-02 NOTE — Progress Notes (Signed)
Date: 07/02/2012  MRN:  [id number] Name:  [name] Sex:  female Age:  72 y.o. DOB:May 31, 1940   PSC #:                       Facility/Room:  [facility] Level Of Care:  SNF Provider: Carita Pian, CMD  Emergency Contacts: Contact Information   Name Relation Home Work Mobile   Jim Thorpe Legal Guardian [phone]     Yancey Flemings Daughter [phone]     Carney Living  [phone]        Code Status:  Full code  Allergies:No Known Allergies   Chief Complaint  Patient presents with  . Hospitalization Follow-up    readmission s/p hospitalization for acute renal failure   HPI: 72 y.o. Female long term care resident with h/o  DMII, HTN, blindness, prior TIA, recently treated for UTI with Bactrim (unfortunately culture grew Kleb Pneumo resistant to it and results had just returned) was sent out due to decreased responsiveness.  Her diuretics had already been held and she had been started on IVF before being sent out.  When she arrived in the ED, she remained in ARF with Cr of 4, and persistent UTI with UA showing 21-50 WBC and rare bacteria. She also remained volume depleted, with BUN of 127. She was admitted for her UTI, AKI, malaise, and volume depletion. Her diuretics remained on hold and volume repletion was continued.   A repeat UA and urine cultures were obtained, grew 35,000 colonies with multiple bacterial morphotypes suggesting she no longer had a UTI, but she was started on primaxin and completed a 5 day course. She was also in metabolic acidosis from the renal failure. She received bicarbonate infusions and her bicarb improved a little. Her lasix was reduced to 20mg  while there so we will need to monitor her volume status closely here.  When seen, she was much improved though weak.  She had no other complaints.     Past Medical History  Diagnosis Date  . Hypertension   . Diabetes mellitus   . Blindness   . TIA (transient ischemic attack)   . Anemia   .  GERD (gastroesophageal reflux disease)   . Renal disorder   . Constipation     Past Surgical History  Procedure Laterality Date  . Gallbladder surgery    . Esophageal dilation    . Cholecystectomy    . Amputation  02/18/2012    Procedure: AMPUTATION RAY;  Surgeon: Kennieth Rad, MD;  Location: [facility] ORS;  Service: Orthopedics;  Laterality: Right;  right second toe  . Tee without cardioversion  02/21/2012    Procedure: TRANSESOPHAGEAL ECHOCARDIOGRAM (TEE);  Surgeon: Ricki Rodriguez, MD;  Location: [facility] ENDOSCOPY;  Service: Cardiovascular;  Laterality: N/A;   spoke with [name] from [facility] pt will arrive by 815ish( thy change shifts at 7a)  . Tee without cardioversion  02/25/2012    Procedure: TRANSESOPHAGEAL ECHOCARDIOGRAM (TEE);  Surgeon: Ricki Rodriguez, MD;  Location: [facility] ENDOSCOPY;  Service: Cardiovascular;  Laterality: N/A;      Current Outpatient Prescriptions  Medication Sig Dispense Refill  . aspirin EC 325 MG tablet Take 325 mg by mouth daily.        . Brinzolamide-Brimonidine (SIMBRINZA) 1-0.2 % SUSP Place 1 drop into the left eye 2 (two) times daily.      . carvedilol (COREG) 3.125 MG tablet Take 1 tablet (3.125 mg total) by mouth daily with breakfast.      .  docusate sodium (COLACE) 100 MG capsule Take 100 mg by mouth 2 (two) times daily.      . furosemide (LASIX) 20 MG tablet Take 1 tablet (20 mg total) by mouth daily.  30 tablet  0  . gabapentin (NEURONTIN) 100 MG capsule Take 100 mg by mouth at bedtime.      . hydrocortisone (ANUSOL-HC) 2.5 % rectal cream Place 1 application rectally 2 (two) times daily.      . insulin aspart (NOVOLOG) 100 UNIT/ML injection Inject 5 Units into the skin 3 (three) times daily before meals.      . insulin glargine (LANTUS) 100 UNIT/ML injection Inject 10 Units into the skin at bedtime.      Marland Kitchen ipratropium-albuterol (DUONEB) 0.5-2.5 (3) MG/3ML SOLN Take 3 mLs by nebulization every 6 (six) hours as needed. wheezing      . metoCLOPramide (REGLAN) 5 MG  tablet Take 5 mg by mouth 3 (three) times daily.      . Multiple Vitamins-Minerals (MULTIVITAMIN WITH MINERALS) tablet Take 1 tablet by mouth daily.      Marland Kitchen oxyCODONE (OXY IR/ROXICODONE) 5 MG immediate release tablet Take one tablet every 4 hours as needed for pain  180 tablet  0  . polyethylene glycol (MIRALAX / GLYCOLAX) packet Take 17 g by mouth daily.  14 each  0  . sodium chloride (OCEAN) 0.65 % SOLN nasal spray Place 1 spray into the nose 4 (four) times daily as needed for congestion.       No current facility-administered medications for this visit.     History  Substance Use Topics  . Smoking status: Former Smoker -- 0 years    Quit date: 06/25/1999  . Smokeless tobacco: Current User    Types: Snuff  . Alcohol Use: No    No family history on file.   Review of Systems  Constitutional: Negative for fever and chills.  Eyes:       Blind  Respiratory: Negative for shortness of breath.   Cardiovascular: Negative for chest pain and palpitations.  Gastrointestinal: Negative for constipation.  Genitourinary: Negative for dysuria, urgency, frequency and flank pain.  Musculoskeletal: Negative for falls.  Skin: Negative for rash.  Neurological: Positive for weakness. Negative for dizziness, loss of consciousness and headaches.  Psychiatric/Behavioral: Negative for memory loss.   Vital signs: BP 122/66  Pulse 74  Temp(Src) 97.6 F (36.4 C)  Resp 16  Ht 5\' 2"  (1.575 m)  Wt 225 lb (102.059 kg)  BMI 41.14 kg/m2  SpO2 96%  Physical Exam  Constitutional: She is oriented to person, place, and time. She appears well-developed and well-nourished. No distress.  Obese black female, was in bed resting  HENT:  Head: Normocephalic and atraumatic.  Right Ear: External ear normal.  Left Ear: External ear normal.  Nose: Nose normal.  Mouth/Throat: Oropharynx is clear and moist. No oropharyngeal exudate.  Eyes: Conjunctivae and EOM are normal. Pupils are equal, round, and reactive to  light.  Neck: Normal range of motion. Neck supple. No JVD present. No tracheal deviation present. No thyromegaly present.  Cardiovascular: Normal rate, regular rhythm, normal heart sounds and intact distal pulses.   Pulmonary/Chest: Effort normal and breath sounds normal. No respiratory distress.  Abdominal: Soft. Bowel sounds are normal. She exhibits no distension. There is no tenderness.  Musculoskeletal: Normal range of motion. She exhibits no edema and no tenderness.  Lymphadenopathy:    She has no cervical adenopathy.  Neurological: She is alert and oriented to person, place,  and time. No cranial nerve deficit.  Skin: Skin is warm and dry.  Psychiatric: She has a normal mood and affect. Her behavior is normal. Judgment and thought content normal.   Labs:   URINE CULTURE Status: None    Collection Time    06/25/12 10:00 AM   Result  Value  Range  Status    Specimen Description  URINE, RANDOM   Final    Special Requests  NONE   Final    Culture Setup Time  06/25/2012 17:07   Final    Colony Count  35,000 COLONIES/ML   Final    Culture    Final    Value:  Multiple bacterial morphotypes present, none predominant. Suggest appropriate recollection if clinically indicated.    Report Status  06/26/2012 FINAL   Final   Labs:  Basic Metabolic Panel:  Recent Labs  Lab  06/24/12 2015  06/25/12 0221  06/25/12 0950  06/26/12 0600  06/27/12 0635  06/28/12 0600   NA  131*  --  136  135  137  139   K  5.0  --  4.8  4.7  4.9  4.8   CL  94*  --  101  104  107  110   CO2  21  --  18*  17*  17*  18*   GLUCOSE  118*  --  130*  87  92  150*   BUN  127*  --  125*  101*  74*  57*   CREATININE  4.15*  3.78*  3.49*  2.70*  2.03*  1.55*   CALCIUM  9.1  --  8.8  8.8  8.7  8.5   Liver Function Tests:  Recent Labs  Lab  06/24/12 2015   AST  19   ALT  48*   ALKPHOS  121*   BILITOT  0.2*   PROT  8.1   ALBUMIN  3.0*   CBC:  Recent Labs  Lab  06/24/12 2015  06/25/12 0221   WBC  8.3  8.3    NEUTROABS  5.6  --   HGB  9.4*  10.2*   HCT  28.5*  31.0*   MCV  81.9  83.1   PLT  287  268   BNP:  BNP (last 3 results)  Recent Labs   02/14/12 1045  02/17/12 1844   PROBNP  7013.0*  14005.0*   CBG:  Recent Labs  Lab  06/27/12 0742  06/27/12 1143  06/27/12 1618  06/27/12 2142  06/28/12 0826   GLUCAP  99  111*  143*  140*  133*      Procedures: -Ct Abdomen Pelvis W Contrast  06/18/2012 *RADIOLOGY REPORT* Clinical Data: Vaginal bleeding. CT ABDOMEN AND PELVIS WITH CONTRAST Technique: Multidetector CT imaging of the abdomen and pelvis was performed following the standard protocol during bolus administration of intravenous contrast. Contrast: OMNIPAQUE IOHEXOL 300 MG/ML SOLN Comparison: 06/26/2011 Findings: The lung bases are clear except for right basilar scarring changes. The heart is mildly enlarged. Coronary artery calcifications are noted. There is a small hiatal hernia noted. The liver is unremarkable. No focal hepatic lesions or intrahepatic biliary dilatation. A calcified granuloma is noted in the left lobe. The gallbladder is surgically absent. No common bile duct dilatation. The pancreas is unremarkable. The spleen is normal in size. No focal lesions. The adrenal glands and kidneys appear stable. There are bilateral renal calculi and renal scarring changes but no hydronephrosis or obstructing  ureteral calculi. No bladder calculi. The stomach, duodenum, small bowel and colon are unremarkable. The appendix is normal. No mesenteric or retroperitoneal mass or adenopathy. The aorta is normal in caliber. Moderate atherosclerotic calcifications involving the aorta, branch vessels and iliac arteries. The bladder is mildly distended. The uterus appears stable. Suspect fundal fibroids but no obvious endometrial or cervical mass. There is moderate fecal impaction in the left with mild rectal wall thickening. No pelvic mass, adenopathy or free pelvic fluid collections. No inguinal mass or  adenopathy. The bony structures are intact. There are degenerative changes involving the hips, SI joints and spine. IMPRESSION: 1. No acute abdominal/pelvic findings, mass lesions or adenopathy. 2. Bilateral renal calculi and renal scarring changes. 3. Slightly enlarged uterus with suspected fibroids unchanged since prior study. No endometrial or adnexal mass is identified. 4. Mild fecal impaction in the rectum. Original Report Authenticated By: Rudie Meyer, M.D.   US Renal  06/25/2012 *RADIOLOGY REPORT* Clinical Data: Urinary tract infection RENAL/URINARY TRACT ULTRASOUND COMPLETE Comparison: CT abdomen 05/20 and 01/2012 Findings: Right Kidney = 11.2 cm. Limited visibility due to body habitus. No hydronephrosis. Left kidney = 12.0 cm. Nonobstructing echogenic calculus within the mid kidney. No hydronephrosis Bladder: Bladder partially distended. No irregularity identified. IMPRESSION: No evidence of hydronephrosis. Nephrolithiasis better on comparison CT. Original Report Authenticated By: Genevive Bi, M.D.     Plan: 1. UTI (urinary tract infection) Completed treatment with primaxin though it seems her UTI had actually been treated adequately prior to that specimen being obtained or it was contaminated Was rehydrated and now back to baseline mentation and level of consciousness  2. 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 ESSENTIAL HYPERTENSION -at goal with carvedilol and lasix -should be on acei due to DMII--will address at a routine visit after I am certain her renal failure has resolved  4. Acute renal failure -much improved with hydration--may have been effect of bactrim on receptors in addition  to inadequate fluid intake in context of uti treatment plus diuretic use -diuretic dose was reduced so must monitor for edema, chf -f/u bmp  5. Hyperlipidemia LDL goal < 100 -f/u flp until lipids at goal with simvastatin  6. Osteoarthritis of both knees -stable with use of tylenol for pain   7. Anemia -f/u cbc

## 2012-07-14 ENCOUNTER — Other Ambulatory Visit: Payer: Self-pay | Admitting: Geriatric Medicine

## 2012-07-14 MED ORDER — OXYCODONE HCL 5 MG PO TABS: ORAL_TABLET | ORAL | Status: DC

## 2012-10-05 ENCOUNTER — Encounter: Payer: Self-pay | Admitting: Internal Medicine

## 2012-10-05 ENCOUNTER — Non-Acute Institutional Stay (SKILLED_NURSING_FACILITY): Payer: Medicare Other | Admitting: Internal Medicine

## 2012-10-05 DIAGNOSIS — I10 Essential (primary) hypertension: Secondary | ICD-10-CM

## 2012-10-05 DIAGNOSIS — M170 Bilateral primary osteoarthritis of knee: Secondary | ICD-10-CM

## 2012-10-05 DIAGNOSIS — E119 Type 2 diabetes mellitus without complications: Secondary | ICD-10-CM

## 2012-10-05 DIAGNOSIS — M1710 Unilateral primary osteoarthritis, unspecified knee: Secondary | ICD-10-CM

## 2012-10-05 DIAGNOSIS — D649 Anemia, unspecified: Secondary | ICD-10-CM

## 2012-10-05 DIAGNOSIS — IMO0002 Reserved for concepts with insufficient information to code with codable children: Secondary | ICD-10-CM

## 2012-10-05 NOTE — Progress Notes (Signed)
Patient ID: [name], female   DOB: [birth year], 72 y.o.   MRN: [id number] Location:   [facility] Provider:  Gwenith Spitz. Renato Gails, D.O., C.M.D.  Code Status:  Full code  Chief Complaint  Patient presents with  . Medical Managment of Chronic Issues    HPI:  72 yo black female long term care resident with h/o legal blindness, DMII, CHF, PAD seen for med mgt of her chronic diseases.  She is currently on lantus 10 units with novolog 5 units for meal coverage.  She is doing well except complaining of her mechanical soft diet--wants to eat the hot dog being served for dinner (also unlikely to be on the cardiac prudent diet).    Review of Systems:  Review of Systems  Constitutional: Negative for fever, chills and malaise/fatigue.  HENT: Negative for sore throat.   Eyes:       Legally blind  Respiratory: Negative for shortness of breath.   Cardiovascular: Negative for chest pain.  Gastrointestinal: Negative for abdominal pain, diarrhea and constipation.  Genitourinary: Negative for dysuria.  Musculoskeletal: Negative for falls.  Skin: Negative for rash.  Neurological: Negative for dizziness and headaches.  Psychiatric/Behavioral: Negative for depression and memory loss.    Medications: Patient's Medications  New Prescriptions   No medications on file  Previous Medications   ASPIRIN EC 325 MG TABLET    Take 325 mg by mouth daily.     BRINZOLAMIDE-BRIMONIDINE (SIMBRINZA) 1-0.2 % SUSP    Place 1 drop into the left eye 2 (two) times daily.   CARVEDILOL (COREG) 3.125 MG TABLET    Take 1 tablet (3.125 mg total) by mouth daily with breakfast.   DOCUSATE SODIUM (COLACE) 100 MG CAPSULE    Take 100 mg by mouth 2 (two) times daily.   FUROSEMIDE (LASIX) 20 MG TABLET    Take 1 tablet (20 mg total) by mouth daily.   GABAPENTIN (NEURONTIN) 100 MG CAPSULE    Take 100 mg by mouth at bedtime.   HYDROCORTISONE (ANUSOL-HC) 2.5 % RECTAL CREAM    Place 1 application rectally 2 (two) times  daily.   INSULIN ASPART (NOVOLOG) 100 UNIT/ML INJECTION    Inject 5 Units into the skin 3 (three) times daily before meals.   INSULIN GLARGINE (LANTUS) 100 UNIT/ML INJECTION    Inject 10 Units into the skin at bedtime.   IPRATROPIUM-ALBUTEROL (DUONEB) 0.5-2.5 (3) MG/3ML SOLN    Take 3 mLs by nebulization every 6 (six) hours as needed. wheezing   METOCLOPRAMIDE (REGLAN) 5 MG TABLET    Take 5 mg by mouth 3 (three) times daily.   MULTIPLE VITAMINS-MINERALS (MULTIVITAMIN WITH MINERALS) TABLET    Take 1 tablet by mouth daily.   OXYCODONE (OXY IR/ROXICODONE) 5 MG IMMEDIATE RELEASE TABLET    Take one tablet every 6 hours as needed for pain   POLYETHYLENE GLYCOL (MIRALAX / GLYCOLAX) PACKET    Take 17 g by mouth daily.   SODIUM CHLORIDE (OCEAN) 0.65 % SOLN NASAL SPRAY    Place 1 spray into the nose 4 (four) times daily as needed for congestion.  Modified Medications   No medications on file  Discontinued Medications   No medications on file    Physical Exam: Filed Vitals:   10/05/12 1653  BP: 132/87  Pulse: 65  Temp: 97.6 F (36.4 C)  Resp: 20  Physical Exam  Constitutional: She is oriented to person, place, and time.  Obese black female in NAD, seated in her wheelchair in  her room  HENT:  Head: Normocephalic and atraumatic.  Eyes:  Legally blind  Cardiovascular: Normal rate, regular rhythm, normal heart sounds and intact distal pulses.   Pulmonary/Chest: Effort normal and breath sounds normal. No respiratory distress.  Abdominal: Soft. Bowel sounds are normal.  Musculoskeletal: Normal range of motion. She exhibits edema.  Neurological: She is alert and oriented to person, place, and time.  Skin: Skin is warm and dry.  Psychiatric: She has a normal mood and affect.    Labs reviewed: Basic Metabolic Panel:  Recent Labs  16/10/96 0609 02/22/12 0540 02/23/12 0500  06/26/12 0600 06/27/12 0635 06/28/12 0600  NA 135 138 135  < > 135 137 139  K 3.7 3.5 3.8  < > 4.7 4.9 4.8  CL 99  99 98  < > 104 107 110  CO2 24 27 31   < > 17* 17* 18*  GLUCOSE 194* 192* 201*  < > 87 92 150*  BUN 71* 58* 49*  < > 101* 74* 57*  CREATININE 2.28* 1.70* 1.28*  < > 2.70* 2.03* 1.55*  CALCIUM 8.8 8.9 8.6  < > 8.8 8.7 8.5  PHOS 4.1 3.3 3.1  --   --   --   --   < > = values in this interval not displayed.  Liver Function Tests:  Recent Labs  02/14/12 1045  02/22/12 0540 02/23/12 0500 06/24/12 2015  AST 40*  --   --   --  19  ALT 24  --   --   --  48*  ALKPHOS 174*  --   --   --  121*  BILITOT 0.8  --   --   --  0.2*  PROT 7.8  --   --   --  8.1  ALBUMIN 2.4*  < > 2.2* 2.1* 3.0*  < > = values in this interval not displayed.  CBC:  Recent Labs  02/24/12 0520  06/17/12 2345 06/24/12 2015 06/25/12 0221  WBC 19.2*  < > 8.6 8.3 8.3  NEUTROABS 14.7*  --  5.3 5.6  --   HGB 9.2*  < > 10.9* 9.4* 10.2*  HCT 28.2*  < > 34.2* 28.5* 31.0*  MCV 86.2  < > 85.3 81.9 83.1  PLT 413*  < > 265 287 268  < > = values in this interval not displayed. 09/04/12:  Na 136, K 4.4, BUN 72, cr 1.33, glucose 150, Ca 9  Assessment/Plan 1. Type II or unspecified type diabetes mellitus without mention of complication, not stated as uncontrolled -prior amputation of toe -f/u hba1c -stable at this time 2. Unspecified essential hypertension -at goal with current meds 3. Osteoarthritis of both knees -stable with current pain medications--weight loss would be helpful 4. Anemia -f/u cbc Family/ staff Communication: discussed condition with nursing staff  Goals of care: full code Labs/tests ordered:  Cbc, hba1c next routine visit

## 2012-11-07 ENCOUNTER — Non-Acute Institutional Stay (SKILLED_NURSING_FACILITY): Payer: Medicare Other | Admitting: Nurse Practitioner

## 2012-11-07 DIAGNOSIS — D649 Anemia, unspecified: Secondary | ICD-10-CM

## 2012-11-07 DIAGNOSIS — M1990 Unspecified osteoarthritis, unspecified site: Secondary | ICD-10-CM | POA: Insufficient documentation

## 2012-11-07 DIAGNOSIS — I10 Essential (primary) hypertension: Secondary | ICD-10-CM

## 2012-11-07 DIAGNOSIS — E785 Hyperlipidemia, unspecified: Secondary | ICD-10-CM

## 2012-11-07 NOTE — Progress Notes (Signed)
Patient ID: [name], female   DOB: 1940/11/03, 72 y.o.   MRN: [id number]  No Known Allergies  Chief Complaint  Patient presents with  . Medical Managment of Chronic Issues    HPI:  72 year old female who is a long term resident of [facility] being seen today for routine follow up.  DM, COMP CIRCULATION TYPE II UNCONTROLLED The diabetes remains stable.No complications noted from the medication presently being used.takes novolog 5 units prior to meals for cbg >=150 and lantus 10 units qhs  NEUROPATHY, PERIPHERAL The peripheral neuropathy is stable.No complications noted from the medication presently being used.taking Gabapentin 100mg  nightly.  HYPERTENSION, BENIGN The blood pressure readings taken outside the office since the last visit have been in the target range. No complications noted from the medication presently being used.takes coreg 3.125 mg twice daily  CHRONIC COMB SYSTOLIC&DIASTOLIC HEART FAILURE The congestive heart failure is unchanged.The patient has pedal edema that is unchanged.No complications noted from the medication presently being used. takes lasix 40 mg twice daily  GERD The patient's dyspeptic symptoms remain stable.No complications noted from the medication presently being used.is taking reglan 5 mg three times daily  OSTEOARTHRITIS .The patient has severe joint pain.No complications noted from the medication presently being used however pt reports medication does not last long enough and OA in her hands are worse; taking oxy IR 5 mg q 6 hours   Review of Systems:  Review of Systems  Constitutional: Negative for fever, chills and malaise/fatigue.  HENT: Negative for sore throat.   Eyes:       Legally blind  Respiratory: Negative for shortness of breath.   Cardiovascular: Negative for chest pain.  Gastrointestinal: Negative for abdominal pain, diarrhea and constipation.  Genitourinary: Negative for dysuria.  Musculoskeletal: Positive for joint pain. Negative for  falls.  Skin: Negative for rash.  Neurological: Negative for dizziness and headaches.  Psychiatric/Behavioral: Negative for depression and memory loss.     Past Medical History  Diagnosis Date  . Hypertension   . Diabetes mellitus   . Blindness   . TIA (transient ischemic attack)   . Anemia   . GERD (gastroesophageal reflux disease)   . Renal disorder   . Constipation    Past Surgical History  Procedure Laterality Date  . Gallbladder surgery    . Esophageal dilation    . Cholecystectomy    . Amputation  02/18/2012    Procedure: AMPUTATION RAY;  Surgeon: Kennieth Rad, MD;  Location: [facility];  Service: Orthopedics;  Laterality: Right;  right second toe  . Tee without cardioversion  02/21/2012    Procedure: TRANSESOPHAGEAL ECHOCARDIOGRAM (TEE);  Surgeon: Ricki Rodriguez, MD;  Location: [facility] ENDOSCOPY;  Service: Cardiovascular;  Laterality: N/A;   spoke with [name] from [facility] pt will arrive by 815ish( thy change shifts at 7a)  . Tee without cardioversion  02/25/2012    Procedure: TRANSESOPHAGEAL ECHOCARDIOGRAM (TEE);  Surgeon: Ricki Rodriguez, MD;  Location: [facility] ENDOSCOPY;  Service: Cardiovascular;  Laterality: N/A;   Social History:   reports that she quit smoking about 13 years ago. Her smokeless tobacco use includes Snuff. She reports that she does not drink alcohol or use illicit drugs.  No family history on file.  Medications: Patient's Medications  New Prescriptions   No medications on file  Previous Medications   ASPIRIN EC 325 MG TABLET    Take 325 mg by mouth daily.     BRINZOLAMIDE-BRIMONIDINE (SIMBRINZA) 1-0.2 % SUSP    Place  1 drop into the left eye 2 (two) times daily.   CARVEDILOL (COREG) 3.125 MG TABLET    Take 1 tablet (3.125 mg total) by mouth daily with breakfast.   DOCUSATE SODIUM (COLACE) 100 MG CAPSULE    Take 100 mg by mouth 2 (two) times daily.   FUROSEMIDE (LASIX) 20 MG TABLET    Take 1 tablet (20 mg total) by mouth daily.   GABAPENTIN (NEURONTIN) 100 MG  CAPSULE    Take 100 mg by mouth at bedtime.   HYDROCORTISONE (ANUSOL-HC) 2.5 % RECTAL CREAM    Place 1 application rectally 2 (two) times daily.   INSULIN ASPART (NOVOLOG) 100 UNIT/ML INJECTION    Inject 5 Units into the skin 3 (three) times daily before meals.   INSULIN GLARGINE (LANTUS) 100 UNIT/ML INJECTION    Inject 10 Units into the skin at bedtime.   IPRATROPIUM-ALBUTEROL (DUONEB) 0.5-2.5 (3) MG/3ML SOLN    Take 3 mLs by nebulization every 6 (six) hours as needed. wheezing   METOCLOPRAMIDE (REGLAN) 5 MG TABLET    Take 5 mg by mouth 3 (three) times daily.   MULTIPLE VITAMINS-MINERALS (MULTIVITAMIN WITH MINERALS) TABLET    Take 1 tablet by mouth daily.   OXYCODONE (OXY IR/ROXICODONE) 5 MG IMMEDIATE RELEASE TABLET    Take one tablet every 6 hours as needed for pain   POLYETHYLENE GLYCOL (MIRALAX / GLYCOLAX) PACKET    Take 17 g by mouth daily.   SODIUM CHLORIDE (OCEAN) 0.65 % SOLN NASAL SPRAY    Place 1 spray into the nose 4 (four) times daily as needed for congestion.  Modified Medications   No medications on file  Discontinued Medications   No medications on file     Physical Exam:  Filed Vitals:   11/07/12 1340  BP: 127/63  Pulse: 65  Temp: 97.6 F (36.4 C)  Resp: 18    Physical Exam  Constitutional: She is well-developed, well-nourished, and in no distress. No distress.  HENT:  Head: Normocephalic and atraumatic.  Neck: Normal range of motion. Neck supple.  Cardiovascular: Normal rate, regular rhythm and normal heart sounds.   Pulmonary/Chest: Effort normal and breath sounds normal. No respiratory distress. She has no wheezes.  Abdominal: Soft. Bowel sounds are normal.  Musculoskeletal: She exhibits no edema and no tenderness.  Neurological: She is alert.  Skin: Skin is warm and dry. She is not diaphoretic.     Labs reviewed: Basic Metabolic Panel:  Recent Labs  19/14/78 0609 02/22/12 0540 02/23/12 0500  06/26/12 0600 06/27/12 0635 06/28/12 0600  NA 135 138  135  < > 135 137 139  K 3.7 3.5 3.8  < > 4.7 4.9 4.8  CL 99 99 98  < > 104 107 110  CO2 24 27 31   < > 17* 17* 18*  GLUCOSE 194* 192* 201*  < > 87 92 150*  BUN 71* 58* 49*  < > 101* 74* 57*  CREATININE 2.28* 1.70* 1.28*  < > 2.70* 2.03* 1.55*  CALCIUM 8.8 8.9 8.6  < > 8.8 8.7 8.5  PHOS 4.1 3.3 3.1  --   --   --   --   < > = values in this interval not displayed. Liver Function Tests:  Recent Labs  02/14/12 1045  02/22/12 0540 02/23/12 0500 06/24/12 2015  AST 40*  --   --   --  19  ALT 24  --   --   --  48*  ALKPHOS 174*  --   --   --  121*  BILITOT 0.8  --   --   --  0.2*  PROT 7.8  --   --   --  8.1  ALBUMIN 2.4*  < > 2.2* 2.1* 3.0*  < > = values in this interval not displayed. No results found for this basename: LIPASE, AMYLASE,  in the last 8760 hours  Recent Labs  02/14/12 1045  AMMONIA 32   CBC:  Recent Labs  02/24/12 0520  06/17/12 2345 06/24/12 2015 06/25/12 0221  WBC 19.2*  < > 8.6 8.3 8.3  NEUTROABS 14.7*  --  5.3 5.6  --   HGB 9.2*  < > 10.9* 9.4* 10.2*  HCT 28.2*  < > 34.2* 28.5* 31.0*  MCV 86.2  < > 85.3 81.9 83.1  PLT 413*  < > 265 287 268  < > = values in this interval not displayed. Cardiac Enzymes:  Recent Labs  02/14/12 1045 02/20/12 0710 02/24/12 0520  CKTOTAL  --  8 12  TROPONINI <0.30  --   --    BNP: No components found with this basename: POCBNP,  CBG:  Recent Labs  06/27/12 2142 06/28/12 0826 06/28/12 1213  GLUCAP 140* 133* 134*  Comprehensive Metabolic Panel    Result: 08/27/2012 10:01 AM   ( Status: F )     C Sodium 136     135-145 mEq/L SLN   Potassium 4.2     3.5-5.3 mEq/L SLN   Chloride 104     96-112 mEq/L SLN   CO2 23     19-32 mEq/L SLN   Glucose 161   H 70-99 mg/dL SLN   BUN 61   H 1-47 mg/dL SLN   Creatinine 8.29     0.50-1.10 mg/dL SLN   Bilirubin, Total 0.3     0.3-1.2 mg/dL SLN   Alkaline Phosphatase 70     39-117 U/L SLN   AST/SGOT 15     0-37 U/L SLN   ALT/SGPT 14     0-35 U/L SLN   Total Protein 7.2      6.0-8.3 g/dL SLN   Albumin 3.4   L 5.6-2.1 g/dL SLN   Calcium 9.3     3.0-86.5 mg/dL SLN    7/84/69 Wbc 7.5, hgb 12.0, hct 36.3 Sodium 136, potassium 4.5, glucose 66, BUN 57, Cr 1.06 Cholesterol 209, trig 95, LDL 149, HDL 41 A1c 7.1   Assessment/Plan 1. ESSENTIAL HYPERTENSION Patient is stable; continue current regimen. Will monitor and make changes as necessary.  2. Anemia No signs of anemia; cbc stable  3. Other and unspecified hyperlipidemia Will start simvastatin 10 mg qhs; will follow up liver enzymes and lipids in 6 weeks  4. OA (osteoarthritis) Will start tylenol 650 mg routinely three times a day

## 2012-12-22 ENCOUNTER — Non-Acute Institutional Stay (SKILLED_NURSING_FACILITY): Payer: Medicare Other | Admitting: Nurse Practitioner

## 2012-12-22 ENCOUNTER — Encounter: Payer: Self-pay | Admitting: Nurse Practitioner

## 2012-12-22 DIAGNOSIS — I10 Essential (primary) hypertension: Secondary | ICD-10-CM

## 2012-12-22 DIAGNOSIS — M1990 Unspecified osteoarthritis, unspecified site: Secondary | ICD-10-CM

## 2012-12-22 DIAGNOSIS — E785 Hyperlipidemia, unspecified: Secondary | ICD-10-CM

## 2012-12-22 DIAGNOSIS — K222 Esophageal obstruction: Secondary | ICD-10-CM

## 2012-12-22 DIAGNOSIS — E119 Type 2 diabetes mellitus without complications: Secondary | ICD-10-CM

## 2012-12-22 NOTE — Progress Notes (Signed)
Patient ID: [name], female   DOB: 02/11/1940, 72 y.o.   MRN: [id number]    Nursing Home Location:  [facility]   Place of Service: SNF (31)  No Known Allergies  Chief Complaint  Patient presents with  . Medical Managment of Chronic Issues    HPI:  72 year old female who is a long term resident of [facility] being seen today for routine follow up.  DM, COMP CIRCULATION TYPE II UNCONTROLLED The diabetes remains stable.No complications noted from the medication presently being used.takes novolog 5 units prior to meals for cbg >=150 and lantus 10 units qhs  NEUROPATHY, PERIPHERAL The peripheral neuropathy is stable.No complications noted from the medication presently being used.taking Gabapentin 100mg  nightly.  HYPERTENSION, BENIGN The blood pressure readings taken outside the office since the last visit have been in the target range. No complications noted from the medication presently being used.takes coreg 3.125 mg twice daily  CHRONIC COMB SYSTOLIC&DIASTOLIC HEART FAILURE The congestive heart failure is unchanged.no noted edema. takes lasix 40 mg twice daily  GERD The patient's dyspeptic symptoms remain stable.No complications noted from the medication presently being used.is taking reglan 5 mg three times daily  OSTEOARTHRITIS .The patient has severe joint pain.No complications noted from the medication presently being used taking oxy IR 5 mg q 6 hours; last month added tylenol scheduled which has helped the pain   Review of Systems:  Review of Systems  Constitutional: Negative for fever, chills and malaise/fatigue.  HENT: Negative for sore throat.   Eyes:       Legally blind  Respiratory: Negative for shortness of breath.   Cardiovascular: Negative for chest pain.  Gastrointestinal: Negative for abdominal pain, diarrhea and constipation.       Increased difficulty swallowing and food getting stuck; pt reports she has needed an esophageal dilatation twice in  the past and it is time for this again.   Genitourinary: Negative for dysuria.  Musculoskeletal: Positive for joint pain (but this is better). Negative for falls.  Skin: Negative for rash.  Neurological: Negative for dizziness and headaches.  Psychiatric/Behavioral: Negative for depression and memory loss.     Past Medical History  Diagnosis Date  . Hypertension   . Diabetes mellitus   . Blindness   . TIA (transient ischemic attack)   . Anemia   . GERD (gastroesophageal reflux disease)   . Renal disorder   . Constipation    Past Surgical History  Procedure Laterality Date  . Gallbladder surgery    . Esophageal dilation    . Cholecystectomy    . Amputation  02/18/2012    Procedure: AMPUTATION RAY;  Surgeon: Kennieth Rad, MD;  Location: [facility];  Service: Orthopedics;  Laterality: Right;  right second toe  . Tee without cardioversion  02/21/2012    Procedure: TRANSESOPHAGEAL ECHOCARDIOGRAM (TEE);  Surgeon: Ricki Rodriguez, MD;  Location: [facility] ENDOSCOPY;  Service: Cardiovascular;  Laterality: N/A;   spoke with [name] from [facility] pt will arrive by 815ish( thy change shifts at 7a)  . Tee without cardioversion  02/25/2012    Procedure: TRANSESOPHAGEAL ECHOCARDIOGRAM (TEE);  Surgeon: Ricki Rodriguez, MD;  Location: [facility] ENDOSCOPY;  Service: Cardiovascular;  Laterality: N/A;   Social History:   reports that she quit smoking about 13 years ago. Her smokeless tobacco use includes Snuff. She reports that she does not drink alcohol or use illicit drugs.  No family history on file.  Medications: Patient's Medications  New Prescriptions  No medications on file  Previous Medications   ACETAMINOPHEN (TYLENOL) 325 MG TABLET    Take 650 mg by mouth 3 (three) times daily.   ASPIRIN EC 325 MG TABLET    Take 325 mg by mouth daily.     BRINZOLAMIDE-BRIMONIDINE (SIMBRINZA) 1-0.2 % SUSP    Place 1 drop into the left eye 2 (two) times daily.   CARVEDILOL (COREG) 3.125 MG TABLET    Take 1 tablet (3.125  mg total) by mouth daily with breakfast.   DOCUSATE SODIUM (COLACE) 100 MG CAPSULE    Take 100 mg by mouth 2 (two) times daily.   FUROSEMIDE (LASIX) 20 MG TABLET    Take 1 tablet (20 mg total) by mouth daily.   GABAPENTIN (NEURONTIN) 100 MG CAPSULE    Take 100 mg by mouth at bedtime.   HYDROCORTISONE (ANUSOL-HC) 2.5 % RECTAL CREAM    Place 1 application rectally 2 (two) times daily.   INSULIN ASPART (NOVOLOG) 100 UNIT/ML INJECTION    Inject 5 Units into the skin 3 (three) times daily before meals.   INSULIN GLARGINE (LANTUS) 100 UNIT/ML INJECTION    Inject 10 Units into the skin at bedtime.   IPRATROPIUM-ALBUTEROL (DUONEB) 0.5-2.5 (3) MG/3ML SOLN    Take 3 mLs by nebulization every 6 (six) hours as needed. wheezing   METOCLOPRAMIDE (REGLAN) 5 MG TABLET    Take 5 mg by mouth 3 (three) times daily.   MULTIPLE VITAMINS-MINERALS (MULTIVITAMIN WITH MINERALS) TABLET    Take 1 tablet by mouth daily.   OXYCODONE (OXY IR/ROXICODONE) 5 MG IMMEDIATE RELEASE TABLET    Take one tablet every 6 hours as needed for pain   POLYETHYLENE GLYCOL (MIRALAX / GLYCOLAX) PACKET    Take 17 g by mouth daily.   SIMVASTATIN (ZOCOR) 10 MG TABLET    Take 10 mg by mouth daily.   SODIUM CHLORIDE (OCEAN) 0.65 % SOLN NASAL SPRAY    Place 1 spray into the nose 4 (four) times daily as needed for congestion.  Modified Medications   No medications on file  Discontinued Medications   No medications on file     Physical Exam: Constitutional: She is well-developed, well-nourished, and in no distress. No distress.  HENT:  Head: Normocephalic and atraumatic.  Neck: Normal range of motion. Neck supple.  Cardiovascular: Normal rate, regular rhythm and normal heart sounds.  Pulmonary/Chest: Effort normal and breath sounds normal. No respiratory distress. She has no wheezes.  Abdominal: Soft. Bowel sounds are normal.  Musculoskeletal: She exhibits no edema and no tenderness.  Neurological: She is alert and oriented  Skin: Skin is  warm and dry. She is not diaphoretic.   Filed Vitals:   12/22/12 1634  BP: 141/75  Pulse: 87  Temp: 98.8 F (37.1 C)  Resp: 18      Labs reviewed: Basic Metabolic Panel:  Recent Labs  16/10/96 0609 02/22/12 0540 02/23/12 0500  06/26/12 0600 06/27/12 0635 06/28/12 0600  NA 135 138 135  < > 135 137 139  K 3.7 3.5 3.8  < > 4.7 4.9 4.8  CL 99 99 98  < > 104 107 110  CO2 24 27 31   < > 17* 17* 18*  GLUCOSE 194* 192* 201*  < > 87 92 150*  BUN 71* 58* 49*  < > 101* 74* 57*  CREATININE 2.28* 1.70* 1.28*  < > 2.70* 2.03* 1.55*  CALCIUM 8.8 8.9 8.6  < > 8.8 8.7 8.5  PHOS 4.1 3.3 3.1  --   --   --   --   < > =  values in this interval not displayed. Liver Function Tests:  Recent Labs  02/14/12 1045  02/22/12 0540 02/23/12 0500 06/24/12 2015  AST 40*  --   --   --  19  ALT 24  --   --   --  48*  ALKPHOS 174*  --   --   --  121*  BILITOT 0.8  --   --   --  0.2*  PROT 7.8  --   --   --  8.1  ALBUMIN 2.4*  < > 2.2* 2.1* 3.0*  < > = values in this interval not displayed. No results found for this basename: LIPASE, AMYLASE,  in the last 8760 hours  Recent Labs  02/14/12 1045  AMMONIA 32   CBC:  Recent Labs  02/24/12 0520  06/17/12 2345 06/24/12 2015 06/25/12 0221  WBC 19.2*  < > 8.6 8.3 8.3  NEUTROABS 14.7*  --  5.3 5.6  --   HGB 9.2*  < > 10.9* 9.4* 10.2*  HCT 28.2*  < > 34.2* 28.5* 31.0*  MCV 86.2  < > 85.3 81.9 83.1  PLT 413*  < > 265 287 268  < > = values in this interval not displayed. Cardiac Enzymes:  Recent Labs  02/14/12 1045 02/20/12 0710 02/24/12 0520  CKTOTAL  --  8 12  TROPONINI <0.30  --   --    BNP: No components found with this basename: POCBNP,  CBG:  Recent Labs  06/27/12 2142 06/28/12 0826 06/28/12 1213  GLUCAP 140* 133* 134*   TSH:  Recent Labs  02/14/12 1045  TSH 0.295*   A1C: Lab Results  Component Value Date   HGBA1C 7.1* 02/14/2012   Lipid Panel: No results found for this basename: CHOL, HDL, LDLCALC, TRIG,  CHOLHDL, LDLDIRECT,  in the last 8760 hours  Comprehensive Metabolic Panel  Result: 08/27/2012 10:01 AM ( Status: F ) C  Sodium 136 135-145 mEq/L SLN  Potassium 4.2 3.5-5.3 mEq/L SLN  Chloride 104 96-112 mEq/L SLN  CO2 23 19-32 mEq/L SLN  Glucose 161 H 70-99 mg/dL SLN  BUN 61 H 6-04 mg/dL SLN  Creatinine 5.40 9.81-1.91 mg/dL SLN  Bilirubin, Total 0.3 0.3-1.2 mg/dL SLN  Alkaline Phosphatase 70 39-117 U/L SLN  AST/SGOT 15 0-37 U/L SLN  ALT/SGPT 14 0-35 U/L SLN  Total Protein 7.2 6.0-8.3 g/dL SLN  Albumin 3.4 L 4.7-8.2 g/dL SLN  Calcium 9.3 9.5-62.1 mg/dL SLN  04/06/63  Wbc 7.5, hgb 12.0, hct 36.3  Sodium 136, potassium 4.5, glucose 66, BUN 57, Cr 1.06  Cholesterol 209, trig 95, LDL 149, HDL 41  A1c 7.1  Assessment/Plan 1. ESSENTIAL HYPERTENSION Patient is stable; continue current regimen. Will monitor and make changes as necessary.  2. DM -stable on curent medications, A1c 7.1 in September   3. OA (osteoarthritis) -improved with addition of tylenol.   4. Other and unspecified hyperlipidemia Tolerating medication, fasting lipids scheduled  5. Stricture and stenosis of esophagus -trouble swallowing and food getting stuck - pt with dilation of esophagus twice in the past -will get GI consult for evaluation of this at this time

## 2013-01-05 ENCOUNTER — Non-Acute Institutional Stay (SKILLED_NURSING_FACILITY): Payer: Medicare Other | Admitting: Nurse Practitioner

## 2013-01-05 ENCOUNTER — Encounter: Payer: Self-pay | Admitting: Nurse Practitioner

## 2013-01-05 DIAGNOSIS — T148XXA Other injury of unspecified body region, initial encounter: Secondary | ICD-10-CM

## 2013-01-05 NOTE — Progress Notes (Signed)
Patient ID: [name], female   DOB: Dec 22, 1940, 72 y.o.   MRN: [id number]    Nursing Home Location:  [facility] [place]   Place of Service: SNF (31)  PCP: REED, TIFFANY, DO  No Known Allergies  Chief Complaint  Patient presents with  . Acute Visit    blisters    HPI:  72 year old female with PMH of DM, neuropathy, HTN, heart failure, OA and GERD is being seen today at the request of nursing due to new blisters that have come up in the last few days; treatmetn nurse has been following pt due to wound under left breast that is improving however over the last few days 2 large blisters have started; pt reports this are burns and itches and has been very uncomfortable.   Review of Systems: Review of Systems  Constitutional: Negative for fever, chills and malaise/fatigue.  Respiratory: Negative for cough and shortness of breath.   Cardiovascular: Negative for chest pain.  Gastrointestinal: Negative for abdominal pain, diarrhea and constipation.  Skin:       Blisters with red area under left breast   Neurological: Negative for tingling and weakness.     Past Medical History  Diagnosis Date  . Hypertension   . Diabetes mellitus   . Blindness   . TIA (transient ischemic attack)   . Anemia   . GERD (gastroesophageal reflux disease)   . Renal disorder   . Constipation    Past Surgical History  Procedure Laterality Date  . Gallbladder surgery    . Esophageal dilation    . Cholecystectomy    . Amputation  02/18/2012    Procedure: AMPUTATION RAY;  Surgeon: Kennieth Rad, MD;  Location: [facility];  Service: Orthopedics;  Laterality: Right;  right second toe  . Tee without cardioversion  02/21/2012    Procedure: TRANSESOPHAGEAL ECHOCARDIOGRAM (TEE);  Surgeon: Ricki Rodriguez, MD;  Location: [facility] ENDOSCOPY;  Service: Cardiovascular;  Laterality: N/A;   spoke with [name] from [facility] pt will arrive by 815ish( thy change shifts at 7a)  . Tee without cardioversion  02/25/2012     Procedure: TRANSESOPHAGEAL ECHOCARDIOGRAM (TEE);  Surgeon: Ricki Rodriguez, MD;  Location: [facility] ENDOSCOPY;  Service: Cardiovascular;  Laterality: N/A;   Social History:   reports that she quit smoking about 13 years ago. Her smokeless tobacco use includes Snuff. She reports that she does not drink alcohol or use illicit drugs.  No family history on file.  Medications: Patient's Medications  New Prescriptions   No medications on file  Previous Medications   ACETAMINOPHEN (TYLENOL) 325 MG TABLET    Take 650 mg by mouth 3 (three) times daily.   ASPIRIN EC 325 MG TABLET    Take 325 mg by mouth daily.     BRINZOLAMIDE-BRIMONIDINE (SIMBRINZA) 1-0.2 % SUSP    Place 1 drop into the left eye 2 (two) times daily.   CARVEDILOL (COREG) 3.125 MG TABLET    Take 1 tablet (3.125 mg total) by mouth daily with breakfast.   DOCUSATE SODIUM (COLACE) 100 MG CAPSULE    Take 100 mg by mouth 2 (two) times daily.   FUROSEMIDE (LASIX) 20 MG TABLET    Take 1 tablet (20 mg total) by mouth daily.   GABAPENTIN (NEURONTIN) 100 MG CAPSULE    Take 100 mg by mouth at bedtime.   HYDROCORTISONE (ANUSOL-HC) 2.5 % RECTAL CREAM    Place 1 application rectally 2 (two) times daily.   INSULIN ASPART (NOVOLOG)  100 UNIT/ML INJECTION    Inject 5 Units into the skin 3 (three) times daily before meals.   INSULIN GLARGINE (LANTUS) 100 UNIT/ML INJECTION    Inject 10 Units into the skin at bedtime.   IPRATROPIUM-ALBUTEROL (DUONEB) 0.5-2.5 (3) MG/3ML SOLN    Take 3 mLs by nebulization every 6 (six) hours as needed. wheezing   METOCLOPRAMIDE (REGLAN) 5 MG TABLET    Take 5 mg by mouth 3 (three) times daily.   MULTIPLE VITAMINS-MINERALS (MULTIVITAMIN WITH MINERALS) TABLET    Take 1 tablet by mouth daily.   OXYCODONE (OXY IR/ROXICODONE) 5 MG IMMEDIATE RELEASE TABLET    Take one tablet every 6 hours as needed for pain   POLYETHYLENE GLYCOL (MIRALAX / GLYCOLAX) PACKET    Take 17 g by mouth daily.   SIMVASTATIN (ZOCOR) 10 MG TABLET    Take 10 mg  by mouth daily.   SODIUM CHLORIDE (OCEAN) 0.65 % SOLN NASAL SPRAY    Place 1 spray into the nose 4 (four) times daily as needed for congestion.  Modified Medications   No medications on file  Discontinued Medications   No medications on file     Physical Exam:  Filed Vitals:   01/05/13 1449  BP: 132/70  Pulse: 67  Temp: 97.5 F (36.4 C)  Resp: 20    Physical Exam  Constitutional: No distress.  Cardiovascular: Normal rate, regular rhythm and normal heart sounds.   Pulmonary/Chest: Effort normal and breath sounds normal. No respiratory distress.  Abdominal: Soft. Bowel sounds are normal. She exhibits no distension. There is no tenderness.  Neurological: She is alert.  Skin: Skin is warm and dry. She is not diaphoretic.  2 nickel sized blisters with small vesicles to the surrounding area with minimal erythema      Assessment/Plan 1. Blister -will treat like shingles -Valacyclovir 1 gm q 8 hours for 7 days -will increase gabapentin to 100 mg TID for 7 days  -pt not to wear bra -treatment nurse to follow and notify if worsening blisters occur or infection

## 2013-01-28 ENCOUNTER — Encounter: Payer: Self-pay | Admitting: Nurse Practitioner

## 2013-01-28 ENCOUNTER — Non-Acute Institutional Stay (SKILLED_NURSING_FACILITY): Payer: Medicare Other | Admitting: Nurse Practitioner

## 2013-01-28 DIAGNOSIS — I10 Essential (primary) hypertension: Secondary | ICD-10-CM

## 2013-01-28 DIAGNOSIS — D649 Anemia, unspecified: Secondary | ICD-10-CM

## 2013-01-28 DIAGNOSIS — T148XXA Other injury of unspecified body region, initial encounter: Secondary | ICD-10-CM

## 2013-01-28 DIAGNOSIS — E119 Type 2 diabetes mellitus without complications: Secondary | ICD-10-CM

## 2013-01-28 DIAGNOSIS — M1990 Unspecified osteoarthritis, unspecified site: Secondary | ICD-10-CM

## 2013-01-28 NOTE — Progress Notes (Signed)
Patient ID: [name], female   DOB: 1940-03-31, 72 y.o.   MRN: [id number]    Nursing Home Location:  [facility]   Place of Service: SNF (31)  PCP: REED, TIFFANY, DO  No Known Allergies  Chief Complaint  Patient presents with  . Medical Managment of Chronic Issues    HPI:  72 year old female with PMH of DM, neuropathy, HTN, heart failure, OA and GERD is being seen today for routine follow up on chronic conditions, pt was seen in the last month due to worsening blisters and was treated for shingles; this have overall improved but is still being followed by treatment nurse. Pt is currently without complaints and staff has no concerns.   Review of Systems:  Review of Systems  Constitutional: Negative for fever, chills and malaise/fatigue.  HENT: Negative for sore throat.   Eyes:       Legally blind  Respiratory: Negative for shortness of breath.   Cardiovascular: Negative for chest pain.  Gastrointestinal: Negative for abdominal pain, diarrhea and constipation.  Genitourinary: Negative for dysuria.  Musculoskeletal: Positive for joint pain. Negative for falls.  Skin: Negative for rash.  Neurological: Negative for dizziness and headaches.  Psychiatric/Behavioral: Negative for depression and memory loss.     Past Medical History  Diagnosis Date  . Hypertension   . Diabetes mellitus   . Blindness   . TIA (transient ischemic attack)   . Anemia   . GERD (gastroesophageal reflux disease)   . Renal disorder   . Constipation    Past Surgical History  Procedure Laterality Date  . Gallbladder surgery    . Esophageal dilation    . Cholecystectomy    . Amputation  02/18/2012    Procedure: AMPUTATION RAY;  Surgeon: Kennieth Rad, MD;  Location: [facility] ORS;  Service: Orthopedics;  Laterality: Right;  right second toe  . Tee without cardioversion  02/21/2012    Procedure: TRANSESOPHAGEAL ECHOCARDIOGRAM (TEE);  Surgeon: Ricki Rodriguez, MD;  Location: [facility]  ENDOSCOPY;  Service: Cardiovascular;  Laterality: N/A;   spoke with [name] from [facility] pt will arrive by 815ish( thy change shifts at 7a)  . Tee without cardioversion  02/25/2012    Procedure: TRANSESOPHAGEAL ECHOCARDIOGRAM (TEE);  Surgeon: Ricki Rodriguez, MD;  Location: [facility] ENDOSCOPY;  Service: Cardiovascular;  Laterality: N/A;   Social History:   reports that she quit smoking about 13 years ago. Her smokeless tobacco use includes Snuff. She reports that she does not drink alcohol or use illicit drugs.  No family history on file.  Medications: Patient's Medications  New Prescriptions   No medications on file  Previous Medications   ACETAMINOPHEN (TYLENOL) 325 MG TABLET    Take 650 mg by mouth 3 (three) times daily.   ASPIRIN EC 325 MG TABLET    Take 325 mg by mouth daily.     BRINZOLAMIDE-BRIMONIDINE (SIMBRINZA) 1-0.2 % SUSP    Place 1 drop into the left eye 2 (two) times daily.   CARVEDILOL (COREG) 3.125 MG TABLET    Take 1 tablet (3.125 mg total) by mouth daily with breakfast.   DOCUSATE SODIUM (COLACE) 100 MG CAPSULE    Take 100 mg by mouth 2 (two) times daily.   FUROSEMIDE (LASIX) 20 MG TABLET    Take 1 tablet (20 mg total) by mouth daily.   GABAPENTIN (NEURONTIN) 100 MG CAPSULE    Take 100 mg by mouth at bedtime.   HYDROCORTISONE (ANUSOL-HC) 2.5 % RECTAL CREAM  Place 1 application rectally 2 (two) times daily.   INSULIN ASPART (NOVOLOG) 100 UNIT/ML INJECTION    Inject 5 Units into the skin 3 (three) times daily before meals.   INSULIN GLARGINE (LANTUS) 100 UNIT/ML INJECTION    Inject 10 Units into the skin at bedtime.   IPRATROPIUM-ALBUTEROL (DUONEB) 0.5-2.5 (3) MG/3ML SOLN    Take 3 mLs by nebulization every 6 (six) hours as needed. wheezing   METOCLOPRAMIDE (REGLAN) 5 MG TABLET    Take 5 mg by mouth 3 (three) times daily.   MULTIPLE VITAMINS-MINERALS (MULTIVITAMIN WITH MINERALS) TABLET    Take 1 tablet by mouth daily.   OXYCODONE (OXY IR/ROXICODONE) 5 MG IMMEDIATE RELEASE TABLET     Take one tablet every 6 hours as needed for pain   POLYETHYLENE GLYCOL (MIRALAX / GLYCOLAX) PACKET    Take 17 g by mouth daily.   SIMVASTATIN (ZOCOR) 10 MG TABLET    Take 10 mg by mouth daily.   SODIUM CHLORIDE (OCEAN) 0.65 % SOLN NASAL SPRAY    Place 1 spray into the nose 4 (four) times daily as needed for congestion.  Modified Medications   No medications on file  Discontinued Medications   No medications on file     Physical Exam: Physical Exam  Constitutional: She is well-developed, well-nourished, and in no distress. No distress.  HENT:  Head: Normocephalic and atraumatic.  Neck: Normal range of motion. Neck supple.  Cardiovascular: Normal rate, regular rhythm and normal heart sounds.   Pulmonary/Chest: Effort normal and breath sounds normal. No respiratory distress. She has no wheezes.  Abdominal: Soft. Bowel sounds are normal.  Musculoskeletal: She exhibits no edema and no tenderness.  Neurological: She is alert.  Skin: Skin is warm and dry. She is not diaphoretic.     Filed Vitals:   01/28/13 1127  BP: 154/80  Pulse: 70  Temp: 97.7 F (36.5 C)  Resp: 20  Weight: 207 lb (93.895 kg)      Labs reviewed: Basic Metabolic Panel:  Recent Labs  84/69/62 0609 02/22/12 0540 02/23/12 0500  06/26/12 0600 06/27/12 0635 06/28/12 0600  NA 135 138 135  < > 135 137 139  K 3.7 3.5 3.8  < > 4.7 4.9 4.8  CL 99 99 98  < > 104 107 110  CO2 24 27 31   < > 17* 17* 18*  GLUCOSE 194* 192* 201*  < > 87 92 150*  BUN 71* 58* 49*  < > 101* 74* 57*  CREATININE 2.28* 1.70* 1.28*  < > 2.70* 2.03* 1.55*  CALCIUM 8.8 8.9 8.6  < > 8.8 8.7 8.5  PHOS 4.1 3.3 3.1  --   --   --   --   < > = values in this interval not displayed. Liver Function Tests:  Recent Labs  02/14/12 1045  02/22/12 0540 02/23/12 0500 06/24/12 2015  AST 40*  --   --   --  19  ALT 24  --   --   --  48*  ALKPHOS 174*  --   --   --  121*  BILITOT 0.8  --   --   --  0.2*  PROT 7.8  --   --   --  8.1  ALBUMIN  2.4*  < > 2.2* 2.1* 3.0*  < > = values in this interval not displayed. No results found for this basename: LIPASE, AMYLASE,  in the last 8760 hours  Recent Labs  02/14/12 1045  AMMONIA  32   CBC:  Recent Labs  02/24/12 0520  06/17/12 2345 06/24/12 2015 06/25/12 0221  WBC 19.2*  < > 8.6 8.3 8.3  NEUTROABS 14.7*  --  5.3 5.6  --   HGB 9.2*  < > 10.9* 9.4* 10.2*  HCT 28.2*  < > 34.2* 28.5* 31.0*  MCV 86.2  < > 85.3 81.9 83.1  PLT 413*  < > 265 287 268  < > = values in this interval not displayed. Cardiac Enzymes:  Recent Labs  02/14/12 1045 02/20/12 0710 02/24/12 0520  CKTOTAL  --  8 12  TROPONINI <0.30  --   --    BNP: No components found with this basename: POCBNP,  CBG:  Recent Labs  06/27/12 2142 06/28/12 0826 06/28/12 1213  GLUCAP 140* 133* 134*   TSH:  Recent Labs  02/14/12 1045  TSH 0.295*   A1C: Lab Results  Component Value Date   HGBA1C 7.1* 02/14/2012   Comprehensive Metabolic Panel  Result: 08/27/2012 10:01 AM ( Status: F ) C  Sodium 136 135-145 mEq/L SLN  Potassium 4.2 3.5-5.3 mEq/L SLN  Chloride 104 96-112 mEq/L SLN  CO2 23 19-32 mEq/L SLN  Glucose 161 H 70-99 mg/dL SLN  BUN 61 H 1-91 mg/dL SLN  Creatinine 4.78 2.95-6.21 mg/dL SLN  Bilirubin, Total 0.3 0.3-1.2 mg/dL SLN  Alkaline Phosphatase 70 39-117 U/L SLN  AST/SGOT 15 0-37 U/L SLN  ALT/SGPT 14 0-35 U/L SLN  Total Protein 7.2 6.0-8.3 g/dL SLN  Albumin 3.4 L 3.0-8.6 g/dL SLN  Calcium 9.3 5.7-84.6 mg/dL SLN  9/62/95  Wbc 7.5, hgb 12.0, hct 36.3  Sodium 136, potassium 4.5, glucose 66, BUN 57, Cr 1.06  Cholesterol 209, trig 95, LDL 149, HDL 41  A1c 7.1 12/23/12 Glucose 114, BUN 52, Cr 0.92, Sodium 136, Potassium 4.4  Assessment/Plan 1. ESSENTIAL HYPERTENSION -Patient is stable; continue current regimen. Will monitor and make changes as necessary.  2. DM -fasting blood sugars ranging from 170s-200s -pt currently on 10 lantus at bedtime and novolog with meals  - will follow  A1c at this time; may need to increase lanuts but will cont to monitor at this time  3. OA (osteoarthritis) -stable on current medications  4. Anemia -cbc reviewed; hgb is now stable  5. Blister -overall improvement of blisters; still being followed by treatment nurse who is appling xeroform dressing   6. UTI Pt found to have klebsiella pneumoniae UTI; currently asymptomatic -being treated with Macrobid BID for 7 day course

## 2013-03-06 ENCOUNTER — Non-Acute Institutional Stay (SKILLED_NURSING_FACILITY): Payer: Medicare Other | Admitting: Internal Medicine

## 2013-03-06 ENCOUNTER — Encounter: Payer: Self-pay | Admitting: Internal Medicine

## 2013-03-06 DIAGNOSIS — N183 Chronic kidney disease, stage 3 unspecified: Secondary | ICD-10-CM

## 2013-03-06 DIAGNOSIS — D649 Anemia, unspecified: Secondary | ICD-10-CM

## 2013-03-06 DIAGNOSIS — E785 Hyperlipidemia, unspecified: Secondary | ICD-10-CM

## 2013-03-06 DIAGNOSIS — I10 Essential (primary) hypertension: Secondary | ICD-10-CM

## 2013-03-06 DIAGNOSIS — H543 Unqualified visual loss, both eyes: Secondary | ICD-10-CM

## 2013-03-06 DIAGNOSIS — E119 Type 2 diabetes mellitus without complications: Secondary | ICD-10-CM

## 2013-03-06 DIAGNOSIS — N3289 Other specified disorders of bladder: Secondary | ICD-10-CM

## 2013-03-06 NOTE — Progress Notes (Signed)
MRN: [id number] Name: [name]  Sex: female Age: 73 y.o. DOB: 1940-03-06  [place] #: [name] Facility/Room: 216B Level Of Care: SNF Provider: Inocencio Homes D Emergency Contacts: Extended Emergency Contact Information Primary Emergency Contact: [name] [place] of [place] Phone: [phone] Relation: Legal Guardian Secondary Emergency Contact: [name] [place] of [place] Phone: [phone] Relation: Daughter  Code Status: FULL  Allergies: Review of patient's allergies indicates no known allergies.  Chief Complaint  Patient presents with  . Medical Managment of Chronic Issues    HPI: Patient is 73 y.o. female who is being seen for routine medical problems.  Past Medical History  Diagnosis Date  . Hypertension   . Diabetes mellitus   . Blindness   . TIA (transient ischemic attack)   . Anemia   . GERD (gastroesophageal reflux disease)   . Renal disorder   . Constipation     Past Surgical History  Procedure Laterality Date  . Gallbladder surgery    . Esophageal dilation    . Cholecystectomy    . Amputation  02/18/2012    Procedure: AMPUTATION RAY;  Surgeon: [name], MD;  Location: [facility] ORS;  Service: Orthopedics;  Laterality: Right;  right second toe  . Tee without cardioversion  02/21/2012    Procedure: TRANSESOPHAGEAL ECHOCARDIOGRAM (TEE);  Surgeon: [name], MD;  Location: [facility] ENDOSCOPY;  Service: Cardiovascular;  Laterality: N/A;   spoke with [name] from [place] pt will arrive by 815ish( thy change shifts at [place])  . Tee without cardioversion  02/25/2012    Procedure: TRANSESOPHAGEAL ECHOCARDIOGRAM (TEE);  Surgeon: [name], MD;  Location: [facility] ENDOSCOPY;  Service: Cardiovascular;  Laterality: N/A;      Medication List       This list is accurate as of: 03/06/13 11:59 PM.  Always use your most recent med list.               aspirin EC 325 MG tablet  Take 325 mg by mouth daily.     carvedilol 3.125 MG  tablet  Commonly known as:  COREG  Take 1 tablet (3.125 mg total) by mouth daily with breakfast.     docusate sodium 100 MG capsule  Commonly known as:  COLACE  Take 100 mg by mouth 2 (two) times daily.     DUONEB 0.5-2.5 (3) MG/3ML Soln  Generic drug:  ipratropium-albuterol  Take 3 mLs by nebulization every 6 (six) hours as needed. wheezing     furosemide 20 MG tablet  Commonly known as:  LASIX  Take 1 tablet (20 mg total) by mouth daily.     gabapentin 100 MG capsule  Commonly known as:  NEURONTIN  Take 100 mg by mouth at bedtime.     hydrocortisone 2.5 % rectal cream  Commonly known as:  ANUSOL-HC  Place 1 application rectally 2 (two) times daily.     insulin aspart 100 UNIT/ML injection  Commonly known as:  novoLOG  Inject 5 Units into the skin 3 (three) times daily before meals.     insulin glargine 100 UNIT/ML injection  Commonly known as:  LANTUS  Inject 10 Units into the skin at bedtime.     metoCLOPramide 5 MG tablet  Commonly known as:  REGLAN  Take 5 mg by mouth 3 (three) times daily.     multivitamin with minerals tablet  Take 1 tablet by mouth daily.     oxyCODONE 5 MG immediate release tablet  Commonly known as:  Oxy IR/ROXICODONE  Take one tablet every 6 hours as needed for pain     polyethylene glycol packet  Commonly known as:  MIRALAX / GLYCOLAX  Take 17 g by mouth daily.     SIMBRINZA 1-0.2 % Susp  Generic drug:  Brinzolamide-Brimonidine  Place 1 drop into the left eye 2 (two) times daily.     simvastatin 10 MG tablet  Commonly known as:  ZOCOR  Take 10 mg by mouth daily.     sodium chloride 0.65 % Soln nasal spray  Commonly known as:  OCEAN  Place 1 spray into the nose 4 (four) times daily as needed for congestion.     TYLENOL 325 MG tablet  Generic drug:  acetaminophen  Take 650 mg by mouth 3 (three) times daily.        No orders of the defined types were placed in this encounter.    Immunization History  Administered Date(s)  Administered  . Influenza Whole 11/10/2012  . Pneumococcal-Unspecified 06/04/2012    History  Substance Use Topics  . Smoking status: Former Smoker -- 0 years    Quit date: 06/25/1999  . Smokeless tobacco: Current User    Types: Snuff  . Alcohol Use: No    Review of Systems  DATA OBTAINED: from patient; DEMENTIA SO NOT RELIABLE BUT NO C/O; nurses report no concerns GENERAL: Feels well no fevers, fatigue, appetite changes SKIN: No itching, rash HEENT: No complaint RESPIRATORY: No cough, wheezing, SOB CARDIAC: No chest pain, palpitations, lower extremity edema  GI: No abdominal pain, No N/V/D or constipation, No heartburn or reflux  GU: No dysuria, frequency or urgency, or incontinence  MUSCULOSKELETAL: No unrelieved bone/joint pain NEUROLOGIC: No headache, dizziness or focal weakness PSYCHIATRIC: No overt anxiety or sadness. Sleeps well.   Filed Vitals:   03/06/13 1222  BP: 112/77  Pulse: 83  Temp: 98 F (36.7 C)  Resp: 20    Physical Exam  GENERAL APPEARANCE: Alert, mod conversant. Appropriately groomed. No acute distress; lying in bed with her 2 baby dolls SKIN: No diaphoresis rash HEENT: Unremarkable RESPIRATORY: Breathing is even, unlabored. Lung sounds are clear   CARDIOVASCULAR: Heart RRR no murmurs, rubs or gallops. No peripheral edema  GASTROINTESTINAL: Abdomen is soft, non-tender, not distended w/ normal bowel sounds.  GENITOURINARY: Bladder non tender, not distended  MUSCULOSKELETAL: No abnormal joints or musculature NEUROLOGIC: Cranial nerves 2-12 grossly intact. Moves all extremities no tremor. PSYCHIATRIC: dementia, no behavioral issues  Patient Active Problem List   Diagnosis Date Noted  . Chronic renal failure, stage 3 (moderate) 03/06/2013  . Hyperlipidemia LDL goal < 100 11/07/2012  . OA (osteoarthritis) 11/07/2012  . Anemia 06/24/2012  . Sepsis 02/21/2012  . MRSA bacteremia 02/17/2012  . Necrotic toes 02/17/2012  . Oliguria 02/17/2012  .  Bacteremia due to Gram-positive bacteria 02/16/2012  . HCAP (healthcare-associated pneumonia) 02/14/2012  . SOB (shortness of breath) 02/14/2012  . Cough 02/14/2012  . Acute respiratory failure 02/14/2012  . Leukocytosis 02/14/2012  . Acute renal failure 06/29/2011  . Osteoarthritis of both knees 06/27/2011    Class: Chronic  . PNA (pneumonia) 06/22/2011  . CAP (community acquired pneumonia) 06/22/2011  . Type II or unspecified type diabetes mellitus without mention of complication, not stated as uncontrolled 05/19/2009  . BLINDNESS, BILATERAL 05/19/2009  . ESSENTIAL HYPERTENSION 05/19/2009  . OTHER BILATERAL BUNDLE BRANCH BLOCK 05/19/2009  . LACUNAR INFARCTION 05/19/2009  . 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 Other specified disorders of bladder 05/19/2009  . SYNCOPE 05/19/2009  .  CHOLECYSTECTOMY, HX OF 05/19/2009    CBC    Component Value Date/Time   WBC 8.3 06/25/2012 0221   RBC 3.73* 06/25/2012 0221   HGB 10.2* 06/25/2012 0221   HCT 31.0* 06/25/2012 0221   PLT 268 06/25/2012 0221   MCV 83.1 06/25/2012 0221   LYMPHSABS 1.4 06/24/2012 2015   MONOABS 0.6 06/24/2012 2015   EOSABS 0.6 06/24/2012 2015   BASOSABS 0.0 06/24/2012 2015    CMP     Component Value Date/Time   NA 139 06/28/2012 0600   K 4.8 06/28/2012 0600   CL 110 06/28/2012 0600   CO2 18* 06/28/2012 0600   GLUCOSE 150* 06/28/2012 0600   BUN 57* 06/28/2012 0600   CREATININE 1.55* 06/28/2012 0600   CALCIUM 8.5 06/28/2012 0600   PROT 8.1 06/24/2012 2015   ALBUMIN 3.0* 06/24/2012 2015   AST 19 06/24/2012 2015   ALT 48* 06/24/2012 2015   ALKPHOS 121* 06/24/2012 2015   BILITOT 0.2* 06/24/2012 2015   GFRNONAA 33* 06/28/2012 0600   GFRAA 38* 06/28/2012 0600    Assessment and Plan  Hyperlipidemia LDL goal < 100 Zocor increased to 20 mg daily end of December. 11/2012 LDL was 112, HDL was 31; will recheck 4/1  Type II or unspecified type diabetes mellitus without mention of complication, not stated as uncontrolled Mos recent HbA1c 09/2012 was  7.1, excellent;will recheck with other labs 4/1; continue same insulin regimen of lantus 10 mg qHS and 5 u reg for CBG>150  Other specified disorders of bladder H/o UTI;last UTI 12/2012, treated, no problem in january  Anemia Most recent 09/2012 Hb 12.0/Hct 36.3; improved from prior on no supplements  Chronic renal failure, stage 3 (moderate) 12/23/2012 BUN 52/Cr 0.92; prior 12/08/2012 BUN 65/Cr 0.93; at baseline;recheck 4/1 when other labs drawn  BLINDNESS, BILATERAL No change;pt does well despite  ESSENTIAL HYPERTENSION Well controlled on Lasix and Coreg 3.125 BID    Hennie Duos, MD

## 2013-03-06 NOTE — Assessment & Plan Note (Signed)
Well controlled on Lasix and Coreg 3.125 BID

## 2013-03-06 NOTE — Assessment & Plan Note (Signed)
Most recent 09/2012 Hb 12.0/Hct 36.3; improved from prior on no supplements

## 2013-03-06 NOTE — Assessment & Plan Note (Signed)
No change;pt does well despite

## 2013-03-06 NOTE — Assessment & Plan Note (Addendum)
Zocor increased to 20 mg daily end of December. 11/2012 LDL was 112, HDL was 31; will recheck 4/1

## 2013-03-06 NOTE — Assessment & Plan Note (Signed)
Mos recent HbA1c 09/2012 was 7.1, excellent;will recheck with other labs 4/1; continue same insulin regimen of lantus 10 mg qHS and 5 u reg for CBG>150

## 2013-03-06 NOTE — Assessment & Plan Note (Signed)
H/o UTI;last UTI 12/2012, treated, no problem in january

## 2013-03-06 NOTE — Assessment & Plan Note (Signed)
12/23/2012 BUN 52/Cr 0.92; prior 12/08/2012 BUN 65/Cr 0.93; at baseline;recheck 4/1 when other labs drawn

## 2013-04-27 ENCOUNTER — Encounter: Payer: Self-pay | Admitting: Nurse Practitioner

## 2013-04-27 ENCOUNTER — Non-Acute Institutional Stay (SKILLED_NURSING_FACILITY): Payer: Medicare Other | Admitting: Nurse Practitioner

## 2013-04-27 DIAGNOSIS — N183 Chronic kidney disease, stage 3 unspecified: Secondary | ICD-10-CM

## 2013-04-27 DIAGNOSIS — E785 Hyperlipidemia, unspecified: Secondary | ICD-10-CM

## 2013-04-27 DIAGNOSIS — E119 Type 2 diabetes mellitus without complications: Secondary | ICD-10-CM

## 2013-04-27 DIAGNOSIS — I10 Essential (primary) hypertension: Secondary | ICD-10-CM

## 2013-04-27 DIAGNOSIS — M1990 Unspecified osteoarthritis, unspecified site: Secondary | ICD-10-CM

## 2013-04-27 DIAGNOSIS — D649 Anemia, unspecified: Secondary | ICD-10-CM

## 2013-04-27 NOTE — Progress Notes (Signed)
Patient ID: [name], female   DOB: 1940-04-28, 73 y.o.   MRN: [id number]    Nursing Home Location:  [place] of Service: SNF (31)  PCP: REED, TIFFANY, DO  No Known Allergies  Chief Complaint  Patient presents with  . Medical Managment of Chronic Issues    HPI:  73 year old female with PMH of DM, neuropathy, HTN, heart failure, OA and GERD is being seen today for routine follow up on chronic conditions, there has been no major issues in the last month. Pt is thout complaints and staff has no concerns at this time.   Review of Systems:  Review of Systems  Constitutional: Negative for fever, chills and malaise/fatigue.  HENT: Negative for sore throat.   Eyes:       Legally blind  Respiratory: Negative for shortness of breath.   Cardiovascular: Negative for chest pain.  Gastrointestinal: Negative for heartburn, abdominal pain, diarrhea and constipation.  Genitourinary: Negative for dysuria.  Musculoskeletal: Negative for falls, joint pain and myalgias.  Skin: Negative for rash.  Neurological: Negative for dizziness and headaches.  Psychiatric/Behavioral: Negative for depression and memory loss.     Past Medical History  Diagnosis Date  . Hypertension   . Diabetes mellitus   . Blindness   . TIA (transient ischemic attack)   . Anemia   . GERD (gastroesophageal reflux disease)   . Renal disorder   . Constipation    Past Surgical History  Procedure Laterality Date  . Gallbladder surgery    . Esophageal dilation    . Cholecystectomy    . Amputation  02/18/2012    Procedure: AMPUTATION RAY;  Surgeon: [name], MD;  Location: [facility];  Service: Orthopedics;  Laterality: Right;  right second toe  . Tee without cardioversion  02/21/2012    Procedure: TRANSESOPHAGEAL ECHOCARDIOGRAM (TEE);  Surgeon: [name], MD;  Location[facility] ENDOSCOPY;  Service: Cardiovascular;  Laterality: N/A;   spoke with [name] from [place] pt will arrive by  815ish( thy change shifts at [place])  . Tee without cardioversion  02/25/2012    Procedure: TRANSESOPHAGEAL ECHOCARDIOGRAM (TEE);  Surgeon: [name], MD;  Location: [facility] ENDOSCOPY;  Service: Cardiovascular;  Laterality: N/A;   Social History:   reports that she quit smoking about 13 years ago. Her smokeless tobacco use includes Snuff. She reports that she does not drink alcohol or use illicit drugs.  No family history on file.  Medications: Patient's Medications  New Prescriptions   No medications on file  Previous Medications   ACETAMINOPHEN (TYLENOL) 325 MG TABLET    Take 650 mg by mouth 3 (three) times daily.   ASPIRIN EC 325 MG TABLET    Take 325 mg by mouth daily.     BRINZOLAMIDE-BRIMONIDINE (SIMBRINZA) 1-0.2 % SUSP    Place 1 drop into the left eye 2 (two) times daily.   CARVEDILOL (COREG) 3.125 MG TABLET    Take 1 tablet (3.125 mg total) by mouth daily with breakfast.   DOCUSATE SODIUM (COLACE) 100 MG CAPSULE    Take 100 mg by mouth 2 (two) times daily.   FUROSEMIDE (LASIX) 20 MG TABLET    Take 1 tablet (20 mg total) by mouth daily.   GABAPENTIN (NEURONTIN) 100 MG CAPSULE    Take 100 mg by mouth at bedtime.   HYDROCORTISONE (ANUSOL-HC) 2.5 % RECTAL CREAM    Place 1 application rectally 2 (two) times daily.   INSULIN ASPART (NOVOLOG)  100 UNIT/ML INJECTION    Inject 5 Units into the skin 3 (three) times daily before meals.   INSULIN GLARGINE (LANTUS) 100 UNIT/ML INJECTION    Inject 10 Units into the skin at bedtime.   IPRATROPIUM-ALBUTEROL (DUONEB) 0.5-2.5 (3) MG/3ML SOLN    Take 3 mLs by nebulization every 6 (six) hours as needed. wheezing   METOCLOPRAMIDE (REGLAN) 5 MG TABLET    Take 5 mg by mouth 3 (three) times daily.   MULTIPLE VITAMINS-MINERALS (MULTIVITAMIN WITH MINERALS) TABLET    Take 1 tablet by mouth daily.   OXYCODONE (OXY IR/ROXICODONE) 5 MG IMMEDIATE RELEASE TABLET    Take one tablet every 6 hours as needed for pain   POLYETHYLENE GLYCOL (MIRALAX / GLYCOLAX) PACKET     Take 17 g by mouth daily.   SIMVASTATIN (ZOCOR) 10 MG TABLET    Take 10 mg by mouth daily.   SODIUM CHLORIDE (OCEAN) 0.65 % SOLN NASAL SPRAY    Place 1 spray into the nose 4 (four) times daily as needed for congestion.  Modified Medications   No medications on file  Discontinued Medications   No medications on file     Physical Exam:  Filed Vitals:   04/27/13 1328  BP: 154/89  Pulse: 78  Temp: 98.3 F (36.8 C)  Resp: 20    Physical Exam  Constitutional: She is well-developed, well-nourished, and in no distress. No distress.  HENT:  Head: Normocephalic and atraumatic.  Mouth/Throat: Oropharynx is clear and moist. No oropharyngeal exudate.  Neck: Normal range of motion. Neck supple.  Cardiovascular: Normal rate, regular rhythm and normal heart sounds.   Pulmonary/Chest: Effort normal and breath sounds normal. No respiratory distress. She has no wheezes.  Abdominal: Soft. Bowel sounds are normal.  Musculoskeletal: She exhibits no edema and no tenderness.  Neurological: She is alert.  Skin: Skin is warm and dry. She is not diaphoretic.     Labs reviewed: Basic Metabolic Panel:  Recent Labs  06/26/12 0600 06/27/12 0635 06/28/12 0600  NA 135 137 139  K 4.7 4.9 4.8  CL 104 107 110  CO2 17* 17* 18*  GLUCOSE 87 92 150*  BUN 101* 74* 57*  CREATININE 2.70* 2.03* 1.55*  CALCIUM 8.8 8.7 8.5   Liver Function Tests:  Recent Labs  06/24/12 2015  AST 19  ALT 48*  ALKPHOS 121*  BILITOT 0.2*  PROT 8.1  ALBUMIN 3.0*   No results found for this basename: LIPASE, AMYLASE,  in the last 8760 hours No results found for this basename: AMMONIA,  in the last 8760 hours CBC:  Recent Labs  06/17/12 2345 06/24/12 2015 06/25/12 0221  WBC 8.6 8.3 8.3  NEUTROABS 5.3 5.6  --   HGB 10.9* 9.4* 10.2*  HCT 34.2* 28.5* 31.0*  MCV 85.3 81.9 83.1  PLT 265 287 268   Comprehensive Metabolic Panel  Result: 09/29/5174 10:01 AM ( Status: F ) C  Sodium 136 135-145 mEq/L SLN    Potassium 4.2 3.5-5.3 mEq/L SLN  Chloride 104 96-112 mEq/L SLN  CO2 23 19-32 mEq/L SLN  Glucose 161 H 70-99 mg/dL SLN  BUN 61 H 6-23 mg/dL SLN  Creatinine 1.06 0.50-1.10 mg/dL SLN  Bilirubin, Total 0.3 0.3-1.2 mg/dL SLN  Alkaline Phosphatase 70 39-117 U/L SLN  AST/SGOT 15 0-37 U/L SLN  ALT/SGPT 14 0-35 U/L SLN  Total Protein 7.2 6.0-8.3 g/dL SLN  Albumin 3.4 L 3.5-5.2 g/dL SLN  Calcium 9.3 8.4-10.5 mg/dL SLN  10/08/12  Wbc 7.5, hgb 12.0,  hct 36.3  Sodium 136, potassium 4.5, glucose 66, BUN 57, Cr 1.06  Cholesterol 209, trig 95, LDL 149, HDL 41  A1c 7.1  12/23/12  Glucose 114, BUN 52, Cr 0.92, Sodium 136, Potassium 4.4 02/05/13 hgb A1c 7.4  Assessment/Plan 1. ESSENTIAL HYPERTENSION -slightly elevated but will cont current therapy   2. Type II or unspecified type diabetes mellitus without mention of complication, not stated as uncontrolled -conts on lantus and novolog   3. OA (osteoarthritis) conts on oxycodone as needed for pain   4. Chronic renal failure, stage 3 (moderate) -last Cr stable at 0.92; will follow up cmp   5. Hyperlipidemia LDL goal < 100 -LDL elevated on last labs; will follow up fasting lipids   6. Anemia -hx of will follow up cbc

## 2013-07-15 ENCOUNTER — Non-Acute Institutional Stay (SKILLED_NURSING_FACILITY): Payer: Medicare Other | Admitting: Internal Medicine

## 2013-07-15 ENCOUNTER — Encounter: Payer: Self-pay | Admitting: Internal Medicine

## 2013-07-15 DIAGNOSIS — N950 Postmenopausal bleeding: Secondary | ICD-10-CM

## 2013-07-15 NOTE — Progress Notes (Signed)
Patient ID: [name], female   DOB: [birth year], 73 y.o.   MRN: [id number]  Location:  [place] [facility] Provider:  Rexene Edison. Mariea Clonts, D.O., C.M.D.  Code Status:  Full code  Chief Complaint  Patient presents with  . Acute Visit    lower abdominal cramping with postmenopausal spotting noted last night    HPI:  73 yo female with h/o stroke, DMII, syncope, achalasia, OA, CKDIII, blindness seen for acute visit due to concerns about lower abdominal cramping that "feels like my period" this am and postmenopausal spotting last night.  She takes aspirin 325mg  daily for secondary stroke prevention and due to her DMII.    When seen in the afternoon, she continued to have abdominal cramps in her lower abdomen/groin area.  Staff had not seen additional bleeding.    Review of Systems:  Review of Systems  Constitutional: Positive for malaise/fatigue. Negative for fever.  HENT: Negative for congestion.   Eyes:       Blindness  Respiratory: Negative for shortness of breath.   Cardiovascular: Negative for chest pain.  Gastrointestinal: Positive for constipation.  Genitourinary: Negative for dysuria, urgency and frequency.  Musculoskeletal: Negative for falls.  Skin: Negative for rash.  Neurological: Positive for weakness. Negative for dizziness and loss of consciousness.    Medications: Patient's Medications  New Prescriptions   No medications on file  Previous Medications   ACETAMINOPHEN (TYLENOL) 325 MG TABLET    Take 650 mg by mouth 3 (three) times daily.   ASPIRIN EC 325 MG TABLET    Take 325 mg by mouth daily.     BRINZOLAMIDE-BRIMONIDINE (SIMBRINZA) 1-0.2 % SUSP    Place 1 drop into the left eye 2 (two) times daily.   CARVEDILOL (COREG) 3.125 MG TABLET    Take 1 tablet (3.125 mg total) by mouth daily with breakfast.   DOCUSATE SODIUM (COLACE) 100 MG CAPSULE    Take 100 mg by mouth 2 (two) times daily.   FUROSEMIDE (LASIX) 20 MG TABLET    Take 1 tablet (20 mg total) by mouth  daily.   GABAPENTIN (NEURONTIN) 100 MG CAPSULE    Take 100 mg by mouth at bedtime.   HYDROCORTISONE (ANUSOL-HC) 2.5 % RECTAL CREAM    Place 1 application rectally 2 (two) times daily.   INSULIN ASPART (NOVOLOG) 100 UNIT/ML INJECTION    Inject 5 Units into the skin 3 (three) times daily before meals.   INSULIN GLARGINE (LANTUS) 100 UNIT/ML INJECTION    Inject 10 Units into the skin at bedtime.   IPRATROPIUM-ALBUTEROL (DUONEB) 0.5-2.5 (3) MG/3ML SOLN    Take 3 mLs by nebulization every 6 (six) hours as needed. wheezing   METOCLOPRAMIDE (REGLAN) 5 MG TABLET    Take 5 mg by mouth 3 (three) times daily.   MULTIPLE VITAMINS-MINERALS (MULTIVITAMIN WITH MINERALS) TABLET    Take 1 tablet by mouth daily.   OXYCODONE (OXY IR/ROXICODONE) 5 MG IMMEDIATE RELEASE TABLET    Take one tablet every 6 hours as needed for pain   POLYETHYLENE GLYCOL (MIRALAX / GLYCOLAX) PACKET    Take 17 g by mouth daily.   SIMVASTATIN (ZOCOR) 10 MG TABLET    Take 10 mg by mouth daily.   SODIUM CHLORIDE (OCEAN) 0.65 % SOLN NASAL SPRAY    Place 1 spray into the nose 4 (four) times daily as needed for congestion.  Modified Medications   No medications on file  Discontinued Medications   No medications on file    Physical  Exam: Filed Vitals:   07/15/13 1044  BP: 133/75  Pulse: 67  Temp: 98.2 F (36.8 C)  Resp: 18  Height: 5\' 2"  (1.575 m)  Weight: 221 lb (100.245 kg)  SpO2: 98%   Physical Exam  Cardiovascular: Normal rate, regular rhythm and normal heart sounds.   Pulmonary/Chest: Effort normal and breath sounds normal. She has no rales.  Abdominal: Soft. Bowel sounds are normal. She exhibits no distension and no mass. There is tenderness. There is no rebound and no guarding.  Tenderness of bilateral lower quadrants, groin area  Genitourinary:  Small amt spotting in depend  Neurological: She is alert.  Psychiatric: She has a normal mood and affect.    Assessment/Plan 1. Postmenopausal bleeding -cbc, cmp in  am -complete abdominal ultrasound ordered to evaluate cramps and bleeding--? Uterine mass -if bleeding continues, will need stat labs and gyn eval  Family/ staff Communication: discussed with CNAs and nursing  Goals of care: full code  Labs/tests ordered:  Cbc, cmp, complete abd [place]

## 2013-07-16 ENCOUNTER — Encounter: Payer: Self-pay | Admitting: Internal Medicine

## 2013-09-02 ENCOUNTER — Other Ambulatory Visit (HOSPITAL_COMMUNITY): Payer: Self-pay | Admitting: Orthopaedic Surgery

## 2013-09-02 ENCOUNTER — Encounter (HOSPITAL_COMMUNITY): Payer: Self-pay

## 2013-09-04 ENCOUNTER — Encounter (HOSPITAL_COMMUNITY): Payer: Self-pay | Admitting: *Deleted

## 2013-09-04 NOTE — Pre-Procedure Instructions (Signed)
[name]  09/04/2013   Your procedure is scheduled on: Monday, August 10.  Report to [facility] Admitting at 9:00AM.  Call this number if you have problems the morning of surgery: [phone]   Remember:   Do not eat food or drink liquids after midnight Sunday, August 9.   Take these medicines the morning of surgery with A SIP OF WATER: carvedilol (COREG), metoCLOPramide (REGLAN).  May use ipratropium-albuterol (DUONEB) if needed and may take Tylenol if needed.             NO INSULIN morning of surgery.  Send most recent labs.  Send current Medication Record with medications and time that patient received them on Monday, August 10.     Do not wear jewelry, make-up or nail polish.  Do not wear lotions, powders, or perfumes.  Do not shave 48 hours prior to surgery.

## 2013-09-04 NOTE — Progress Notes (Signed)
Anesthesia Chart Review:  Patient is a 74 year old female posted for right transmetatarsal amputation on 09/07/13 by Dr. Lorin Mercy.  She is a resident of [facility]. She is posted as a same day work-up.  History includes former smoker, HTN, DM2, blindness, TIA, anemia, GERD, acute on chronic renal failure 05/2012 (known CKD stage III), right second toe amputation '14, cholecystectomy. She was hospitalized in 01/2012 for MRSA UTI with bacteria with possible mobile vegetation by 02/25/12 TEE (ID Dr. Linus Salmons: unclear significance, if endocarditis or not and continued antibiotic treatment for 42 days thru 03/29/12 recommended). BMI is consistent with morbid obesity.  She has not had any out-patient follow-up with cardiologist Dr. Doylene Canard since her 01/2012 hospitalization.  TEE on 02/25/12 showed: - Left ventricle: There was mild concentric hypertrophy. Systolic function was normal. Wall motion was normal; there were no regional wall motion abnormalities. Rare strand like mobile shadow in LV outflow tact. - Aortic valve: There was a possible, small, strand-like, highly mobile vegetation. Trivial regurgitation. - Mitral valve: There was a possible, small, strand-like, highly mobile vegetation on the tip of the posterior leaflet. Mild regurgitation. - Left atrium: The atrium was mildly dilated. No evidence of thrombus in the atrial cavity or appendage. - Right atrium: No evidence of thrombus in the atrial cavity or appendage. - Atrial septum: There was a patent foramen ovale. Agitated saline contrast study showed a right-to-left shunt.  EKG on 02/18/12 showed: SR with first degree AVB, right BBB, LAFB, bifascicular block. This EKG appears overall stable since 06/26/11.  She will need a repeat EKG on arrival, as well as CXR, and labs if not done within the required time frame.   Available information reviewed with anesthesiologist Dr. Linna Caprice.  She will be further evaluated by her assigned anesthesiologist on the  day of surgery. If same day diagnostic results are stable/acceptable and no acute CV/CHF symptoms then would anticipate that she could proceed with this procedure.    [facility] Short Stay Center/Anesthesiology Phone [phone] 09/04/2013 4:16 PM

## 2013-09-04 NOTE — Progress Notes (Signed)
[name] is a resident of [facility].  Ms Likes has a guardian and we have a consent for surgery signed by the guardian.  Pt is blind, but alert and oriented.  I asked patients nurse if she knew why patient has a guardian to sign her consent and she did not know. I went over instructions with [name], she rushed me and asked that I fax instructions. I faxed insructions to   [facility].

## 2013-09-06 MED ORDER — CEFAZOLIN SODIUM-DEXTROSE 2-3 GM-% IV SOLR
2.0000 g | INTRAVENOUS | Status: AC
  Administered 2013-09-07: 2 g via INTRAVENOUS
  Filled 2013-09-06: qty 50

## 2013-09-07 ENCOUNTER — Encounter (HOSPITAL_COMMUNITY)
Admission: RE | Disposition: A | Discharge status: Skilled Nursing Facility | Payer: Self-pay | Source: Ambulatory Visit | Attending: Orthopaedic Surgery

## 2013-09-07 ENCOUNTER — Ambulatory Visit (HOSPITAL_COMMUNITY): Payer: Medicare Other

## 2013-09-07 ENCOUNTER — Ambulatory Visit (HOSPITAL_COMMUNITY)
Admission: RE | Admit: 2013-09-07 | Discharge: 2013-09-07 | Disposition: A | Discharge status: Skilled Nursing Facility | Payer: Medicare Other | Source: Ambulatory Visit | Attending: Orthopaedic Surgery | Admitting: Orthopaedic Surgery

## 2013-09-07 ENCOUNTER — Encounter (HOSPITAL_COMMUNITY): Payer: Medicare Other | Admitting: Vascular Surgery

## 2013-09-07 ENCOUNTER — Ambulatory Visit (HOSPITAL_COMMUNITY): Payer: Medicare Other | Admitting: Vascular Surgery

## 2013-09-07 ENCOUNTER — Encounter (HOSPITAL_COMMUNITY): Payer: Self-pay | Admitting: Certified Registered Nurse Anesthetist

## 2013-09-07 DIAGNOSIS — K219 Gastro-esophageal reflux disease without esophagitis: Secondary | ICD-10-CM | POA: Diagnosis not present

## 2013-09-07 DIAGNOSIS — Z6839 Body mass index (BMI) 39.0-39.9, adult: Secondary | ICD-10-CM | POA: Insufficient documentation

## 2013-09-07 DIAGNOSIS — D649 Anemia, unspecified: Secondary | ICD-10-CM | POA: Insufficient documentation

## 2013-09-07 DIAGNOSIS — Z79899 Other long term (current) drug therapy: Secondary | ICD-10-CM | POA: Insufficient documentation

## 2013-09-07 DIAGNOSIS — M869 Osteomyelitis, unspecified: Secondary | ICD-10-CM

## 2013-09-07 DIAGNOSIS — Z7982 Long term (current) use of aspirin: Secondary | ICD-10-CM | POA: Insufficient documentation

## 2013-09-07 DIAGNOSIS — Z8673 Personal history of transient ischemic attack (TIA), and cerebral infarction without residual deficits: Secondary | ICD-10-CM | POA: Diagnosis not present

## 2013-09-07 DIAGNOSIS — M9080 Osteopathy in diseases classified elsewhere, unspecified site: Secondary | ICD-10-CM | POA: Insufficient documentation

## 2013-09-07 DIAGNOSIS — N183 Chronic kidney disease, stage 3 unspecified: Secondary | ICD-10-CM | POA: Diagnosis not present

## 2013-09-07 DIAGNOSIS — Z9889 Other specified postprocedural states: Secondary | ICD-10-CM | POA: Diagnosis not present

## 2013-09-07 DIAGNOSIS — I129 Hypertensive chronic kidney disease with stage 1 through stage 4 chronic kidney disease, or unspecified chronic kidney disease: Secondary | ICD-10-CM | POA: Diagnosis not present

## 2013-09-07 DIAGNOSIS — Z794 Long term (current) use of insulin: Secondary | ICD-10-CM | POA: Diagnosis not present

## 2013-09-07 DIAGNOSIS — Z87891 Personal history of nicotine dependence: Secondary | ICD-10-CM | POA: Insufficient documentation

## 2013-09-07 DIAGNOSIS — E1169 Type 2 diabetes mellitus with other specified complication: Secondary | ICD-10-CM | POA: Insufficient documentation

## 2013-09-07 DIAGNOSIS — I739 Peripheral vascular disease, unspecified: Secondary | ICD-10-CM | POA: Diagnosis not present

## 2013-09-07 HISTORY — PX: AMPUTATION: SHX166

## 2013-09-07 LAB — COMPREHENSIVE METABOLIC PANEL
ALK PHOS: 91 U/L (ref 39–117)
ALT: 17 U/L (ref 0–35)
AST: 33 U/L (ref 0–37)
Albumin: 3.1 g/dL — ABNORMAL LOW (ref 3.5–5.2)
Anion gap: 17 — ABNORMAL HIGH (ref 5–15)
BUN: 43 mg/dL — ABNORMAL HIGH (ref 6–23)
CO2: 21 meq/L (ref 19–32)
Calcium: 8.8 mg/dL (ref 8.4–10.5)
Chloride: 99 mEq/L (ref 96–112)
Creatinine, Ser: 0.98 mg/dL (ref 0.50–1.10)
GFR, EST AFRICAN AMERICAN: 66 mL/min — AB (ref >90)
GFR, EST NON AFRICAN AMERICAN: 57 mL/min — AB (ref >90)
GLUCOSE: 113 mg/dL — AB (ref 70–99)
Potassium: 5.2 mEq/L (ref 3.7–5.3)
SODIUM: 137 meq/L (ref 137–147)
Total Bilirubin: 0.3 mg/dL (ref 0.3–1.2)
Total Protein: 7.3 g/dL (ref 6.0–8.3)

## 2013-09-07 LAB — GLUCOSE, CAPILLARY
GLUCOSE-CAPILLARY: 99 mg/dL (ref 70–99)
Glucose-Capillary: 121 mg/dL — ABNORMAL HIGH (ref 70–99)

## 2013-09-07 LAB — CBC
HCT: 38.9 % (ref 36.0–46.0)
HEMOGLOBIN: 11.9 g/dL — AB (ref 12.0–15.0)
MCH: 27.6 pg (ref 26.0–34.0)
MCHC: 30.6 g/dL (ref 30.0–36.0)
MCV: 90.3 fL (ref 78.0–100.0)
Platelets: 203 10*3/uL (ref 150–400)
RBC: 4.31 MIL/uL (ref 3.87–5.11)
RDW: 16.4 % — ABNORMAL HIGH (ref 11.5–15.5)
WBC: 6.2 10*3/uL (ref 4.0–10.5)

## 2013-09-07 SURGERY — AMPUTATION, FOOT, RAY
Anesthesia: General | Site: Foot | Laterality: Right

## 2013-09-07 MED ORDER — PROPOFOL 10 MG/ML IV BOLUS
INTRAVENOUS | Status: AC
  Filled 2013-09-07: qty 20

## 2013-09-07 MED ORDER — PROPOFOL 10 MG/ML IV BOLUS
INTRAVENOUS | Status: DC | PRN
  Administered 2013-09-07: 130 mg via INTRAVENOUS

## 2013-09-07 MED ORDER — 0.9 % SODIUM CHLORIDE (POUR BTL) OPTIME
TOPICAL | Status: DC | PRN
  Administered 2013-09-07: 1000 mL

## 2013-09-07 MED ORDER — ARTIFICIAL TEARS OP OINT
TOPICAL_OINTMENT | OPHTHALMIC | Status: DC | PRN
  Administered 2013-09-07: 1 via OPHTHALMIC

## 2013-09-07 MED ORDER — EPHEDRINE SULFATE 50 MG/ML IJ SOLN
INTRAMUSCULAR | Status: DC | PRN
  Administered 2013-09-07 (×3): 5 mg via INTRAVENOUS

## 2013-09-07 MED ORDER — MIDAZOLAM HCL 2 MG/2ML IJ SOLN
INTRAMUSCULAR | Status: AC
Start: 2013-09-07 — End: 2013-09-07
  Filled 2013-09-07: qty 2

## 2013-09-07 MED ORDER — HYDROMORPHONE HCL PF 1 MG/ML IJ SOLN: 0.2500 mg | INTRAMUSCULAR | Status: DC | PRN

## 2013-09-07 MED ORDER — ARTIFICIAL TEARS OP OINT
TOPICAL_OINTMENT | OPHTHALMIC | Status: AC
  Filled 2013-09-07: qty 3.5

## 2013-09-07 MED ORDER — OXYCODONE HCL 5 MG/5ML PO SOLN: 5.0000 mg | Freq: Once | ORAL | Status: DC | PRN

## 2013-09-07 MED ORDER — ONDANSETRON HCL 4 MG/2ML IJ SOLN
INTRAMUSCULAR | Status: AC
  Filled 2013-09-07: qty 2

## 2013-09-07 MED ORDER — SUCCINYLCHOLINE CHLORIDE 20 MG/ML IJ SOLN
INTRAMUSCULAR | Status: DC | PRN
  Administered 2013-09-07: 100 mg via INTRAVENOUS

## 2013-09-07 MED ORDER — PROMETHAZINE HCL 25 MG/ML IJ SOLN: 6.2500 mg | INTRAMUSCULAR | Status: DC | PRN

## 2013-09-07 MED ORDER — ONDANSETRON HCL 4 MG/2ML IJ SOLN
INTRAMUSCULAR | Status: DC | PRN
  Administered 2013-09-07: 4 mg via INTRAVENOUS

## 2013-09-07 MED ORDER — FENTANYL CITRATE 0.05 MG/ML IJ SOLN
INTRAMUSCULAR | Status: DC | PRN
Start: 2013-09-07 — End: 2013-09-07
  Administered 2013-09-07: 50 ug via INTRAVENOUS

## 2013-09-07 MED ORDER — GLYCOPYRROLATE 0.2 MG/ML IJ SOLN
INTRAMUSCULAR | Status: AC
  Filled 2013-09-07: qty 3

## 2013-09-07 MED ORDER — MIDAZOLAM HCL 2 MG/2ML IJ SOLN
INTRAMUSCULAR | Status: AC
  Filled 2013-09-07: qty 2

## 2013-09-07 MED ORDER — FENTANYL CITRATE 0.05 MG/ML IJ SOLN
INTRAMUSCULAR | Status: AC
  Filled 2013-09-07: qty 2

## 2013-09-07 MED ORDER — LACTATED RINGERS IV SOLN: INTRAVENOUS | Status: DC

## 2013-09-07 MED ORDER — LIDOCAINE HCL (CARDIAC) 20 MG/ML IV SOLN
INTRAVENOUS | Status: AC
  Filled 2013-09-07: qty 5

## 2013-09-07 MED ORDER — NEOSTIGMINE METHYLSULFATE 10 MG/10ML IV SOLN
INTRAVENOUS | Status: AC
  Filled 2013-09-07: qty 1

## 2013-09-07 MED ORDER — LIDOCAINE HCL (CARDIAC) 20 MG/ML IV SOLN
INTRAVENOUS | Status: DC | PRN
  Administered 2013-09-07: 100 mg via INTRAVENOUS

## 2013-09-07 MED ORDER — ROCURONIUM BROMIDE 50 MG/5ML IV SOLN
INTRAVENOUS | Status: AC
  Filled 2013-09-07: qty 1

## 2013-09-07 MED ORDER — OXYCODONE HCL 5 MG PO TABS: 5.0000 mg | ORAL_TABLET | Freq: Once | ORAL | Status: DC | PRN

## 2013-09-07 MED ORDER — FENTANYL CITRATE 0.05 MG/ML IJ SOLN
INTRAMUSCULAR | Status: AC
  Filled 2013-09-07: qty 5

## 2013-09-07 MED ORDER — LACTATED RINGERS IV SOLN
INTRAVENOUS | Status: DC | PRN
  Administered 2013-09-07: 12:00:00 via INTRAVENOUS

## 2013-09-07 MED ORDER — HYDROCODONE-ACETAMINOPHEN 5-325 MG PO TABS: 1.0000 | ORAL_TABLET | ORAL | Status: DC | PRN

## 2013-09-07 SURGICAL SUPPLY — 47 items
BANDAGE ELASTIC 6 VELCRO ST LF (GAUZE/BANDAGES/DRESSINGS) IMPLANT
BANDAGE ESMARK 6X9 LF (GAUZE/BANDAGES/DRESSINGS) IMPLANT
BLADE AVERAGE 25MMX9MM (BLADE) ×1
BLADE AVERAGE 25X9 (BLADE) ×2 IMPLANT
BLADE LONG MED 31MMX9MM (MISCELLANEOUS) ×1
BLADE LONG MED 31X9 (MISCELLANEOUS) ×2 IMPLANT
BNDG CMPR 9X6 STRL LF SNTH (GAUZE/BANDAGES/DRESSINGS)
BNDG COHESIVE 4X5 TAN STRL (GAUZE/BANDAGES/DRESSINGS) ×3 IMPLANT
BNDG ESMARK 6X9 LF (GAUZE/BANDAGES/DRESSINGS)
CUFF TOURNIQUET SINGLE 34IN LL (TOURNIQUET CUFF) IMPLANT
CUFF TOURNIQUET SINGLE 44IN (TOURNIQUET CUFF) IMPLANT
DRAPE U-SHAPE 47X51 STRL (DRAPES) ×3 IMPLANT
DRSG ADAPTIC 3X8 NADH LF (GAUZE/BANDAGES/DRESSINGS) ×3 IMPLANT
DURAPREP 26ML APPLICATOR (WOUND CARE) ×3 IMPLANT
ELECT REM PT RETURN 9FT ADLT (ELECTROSURGICAL) ×3
ELECTRODE REM PT RTRN 9FT ADLT (ELECTROSURGICAL) ×1 IMPLANT
GAUZE SPONGE 4X4 12PLY STRL (GAUZE/BANDAGES/DRESSINGS) ×3 IMPLANT
GLOVE BIOGEL PI IND STRL 7.5 (GLOVE) ×1 IMPLANT
GLOVE BIOGEL PI IND STRL 8 (GLOVE) ×1 IMPLANT
GLOVE BIOGEL PI INDICATOR 7.5 (GLOVE) ×2
GLOVE BIOGEL PI INDICATOR 8 (GLOVE) ×2
GLOVE ECLIPSE 7.0 STRL STRAW (GLOVE) ×3 IMPLANT
GLOVE ORTHO TXT STRL SZ7.5 (GLOVE) ×3 IMPLANT
GOWN STRL REUS W/ TWL LRG LVL3 (GOWN DISPOSABLE) ×2 IMPLANT
GOWN STRL REUS W/TWL LRG LVL3 (GOWN DISPOSABLE) ×4
KIT BASIN OR (CUSTOM PROCEDURE TRAY) ×3 IMPLANT
KIT ROOM TURNOVER OR (KITS) ×3 IMPLANT
MANIFOLD NEPTUNE II (INSTRUMENTS) ×3 IMPLANT
NS IRRIG 1000ML POUR BTL (IV SOLUTION) ×3 IMPLANT
PACK ORTHO EXTREMITY (CUSTOM PROCEDURE TRAY) ×3 IMPLANT
PAD ABD 8X10 STRL (GAUZE/BANDAGES/DRESSINGS) ×3 IMPLANT
PAD ARMBOARD 7.5X6 YLW CONV (MISCELLANEOUS) ×6 IMPLANT
PAD CAST 4YDX4 CTTN HI CHSV (CAST SUPPLIES) ×1 IMPLANT
PADDING CAST COTTON 4X4 STRL (CAST SUPPLIES) ×2
SPONGE LAP 18X18 X RAY DECT (DISPOSABLE) ×3 IMPLANT
STAPLER VISISTAT 35W (STAPLE) ×3 IMPLANT
STOCKINETTE IMPERVIOUS LG (DRAPES) IMPLANT
SUCTION FRAZIER TIP 10 FR DISP (SUCTIONS) ×3 IMPLANT
SUT ETHILON 2 0 PSLX (SUTURE) ×6 IMPLANT
SUT ETHILON 3 0 PS 1 (SUTURE) ×6 IMPLANT
TOWEL OR 17X24 6PK STRL BLUE (TOWEL DISPOSABLE) ×3 IMPLANT
TOWEL OR 17X26 10 PK STRL BLUE (TOWEL DISPOSABLE) ×3 IMPLANT
TUBE CONNECTING 12'X1/4 (SUCTIONS) ×1
TUBE CONNECTING 12X1/4 (SUCTIONS) ×2 IMPLANT
UNDERPAD 30X30 INCONTINENT (UNDERPADS AND DIAPERS) IMPLANT
WATER STERILE IRR 1000ML POUR (IV SOLUTION) IMPLANT
YANKAUER SUCT BULB TIP NO VENT (SUCTIONS) ×3 IMPLANT

## 2013-09-07 NOTE — Brief Op Note (Signed)
09/07/2013  12:47 PM  PATIENT:  [name]  73 y.o. 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 right  DISPOSITION OF SPECIMEN:  PATHOLOGY  COUNTS:  YES  TOURNIQUET:  * No tourniquets in log *  DICTATION: .Other Dictation: Dictation Number [id number]  PLAN OF CARE: Discharge to home after PACU  Return to SNF  PATIENT DISPOSITION:  PACU - hemodynamically stable.   Delay start of Pharmacological VTE agent (>24hrs) due to surgical blood loss or risk of bleeding: not applicable

## 2013-09-07 NOTE — H&P (Signed)
[name] is an 74 y.o. female.   Chief Complaint: right 3rd toe necrosis with osteomyelitis of 3rd , 4th and 5th toes HPI: diabetes PAD non ambulator with toe drainage and osteomyelitis with bone destruction of 3rh 4th and 5th toes  Past Medical History  Diagnosis Date  . Hypertension   . Diabetes mellitus   . Blindness   . TIA (transient ischemic attack)   . Anemia   . GERD (gastroesophageal reflux disease)   . Constipation   . Renal disorder 05/2012    acute Renal Failure    Past Surgical History  Procedure Laterality Date  . Gallbladder surgery    . Esophageal dilation    . Cholecystectomy    . Amputation  02/18/2012    Procedure: AMPUTATION RAY;  Surgeon: Andrea Revere, MD;  Location: [facility];  Service: Orthopedics;  Laterality: Right;  right second toe  . Tee without cardioversion  02/21/2012    Procedure: TRANSESOPHAGEAL ECHOCARDIOGRAM (TEE);  Surgeon: Andrea Riddle, MD;  Location: [facility] ENDOSCOPY;  Service: Cardiovascular;  Laterality: N/A;   spoke with [name] from [place] pt will arrive by 815ish( thy change shifts at [place])  . Tee without cardioversion  02/25/2012    Procedure: TRANSESOPHAGEAL ECHOCARDIOGRAM (TEE);  Surgeon: Andrea Riddle, MD;  Location: [facility] ENDOSCOPY;  Service: Cardiovascular;  Laterality: N/A;    History reviewed. No pertinent family history. Social History:  reports that she quit smoking about 14 years ago. Her smokeless tobacco use includes Snuff. She reports that she does not drink alcohol or use illicit drugs.  Allergies: No Known Allergies  Medications Prior to Admission  Medication Sig Dispense Refill  . acetaminophen (TYLENOL) 325 MG tablet Take 650 mg by mouth every 6 (six) hours as needed for fever.       Marland Kitchen acetaminophen (TYLENOL) 325 MG tablet Take 650 mg by mouth 3 (three) times daily.      Marland Kitchen aspirin EC 325 MG tablet Take 325 mg by mouth daily.        . Brinzolamide-Brimonidine (SIMBRINZA) 1-0.2 % SUSP Place 1 drop into the left eye 2 (two)  times daily.      . carvedilol (COREG) 3.125 MG tablet Take 3.125 mg by mouth 2 (two) times daily with a meal.      . docusate sodium (COLACE) 100 MG capsule Take 100 mg by mouth 2 (two) times daily.      . furosemide (LASIX) 40 MG tablet Take 40 mg by mouth daily.      Marland Kitchen gabapentin (NEURONTIN) 100 MG capsule Take 100 mg by mouth at bedtime.      . hydrocortisone (ANUSOL-HC) 2.5 % rectal cream Place 1 application rectally 2 (two) times daily as needed for hemorrhoids.       . insulin detemir (LEVEMIR) 100 UNIT/ML injection Inject 12 Units into the skin at bedtime.      . insulin lispro (HUMALOG KWIKPEN) 100 UNIT/ML KiwkPen Inject 5 Units into the skin 3 (three) times daily before meals.      Marland Kitchen ipratropium-albuterol (DUONEB) 0.5-2.5 (3) MG/3ML SOLN Take 3 mLs by nebulization every 6 (six) hours as needed (wheezing).      . metoCLOPramide (REGLAN) 5 MG tablet Take 5 mg by mouth 3 (three) times daily.      . Multiple Vitamins-Minerals (MULTIVITAMIN WITH MINERALS) tablet Take 1 tablet by mouth daily.      . polyethylene glycol (MIRALAX / GLYCOLAX) packet Take 17 g by mouth daily.  14 each  0  . protein supplement (RESOURCE BENEPROTEIN) POWD Take 1 scoop by mouth 3 (three) times daily with meals.      . simvastatin (ZOCOR) 20 MG tablet Take 20 mg by mouth daily.      . sodium chloride (OCEAN) 0.65 % SOLN nasal spray Place 1 spray into the nose 4 (four) times daily as needed for congestion.      . carvedilol (COREG) 3.125 MG tablet Take 1 tablet (3.125 mg total) by mouth daily with breakfast.        Results for orders placed during the hospital encounter of 09/07/13 (from the past 48 hour(s))  GLUCOSE, CAPILLARY     Status: Abnormal   Collection Time    09/07/13  9:52 AM      Result Value Ref Range   Glucose-Capillary 121 (*) 70 - 99 mg/dL  CBC     Status: Abnormal   Collection Time    09/07/13 10:02 AM      Result Value Ref Range   WBC 6.2  4.0 - 10.5 K/uL   RBC 4.31  3.87 - 5.11 MIL/uL    Hemoglobin 11.9 (*) 12.0 - 15.0 g/dL   HCT 38.9  36.0 - 46.0 %   MCV 90.3  78.0 - 100.0 fL   MCH 27.6  26.0 - 34.0 pg   MCHC 30.6  30.0 - 36.0 g/dL   RDW 16.4 (*) 11.5 - 15.5 %   Platelets 203  150 - 400 K/uL   Dg Chest 1 View  09/07/2013   CLINICAL DATA:  Preop for osteomyelitis.  EXAM: CHEST - 1 VIEW  COMPARISON:  02/23/2012  FINDINGS: Mildly degraded exam due to AP portable technique and patient body habitus. Apical lordotic positioning. Midline trachea. Cardiomegaly accentuated by AP portable technique. No pleural effusion or pneumothorax. Clear left lung. Cannot exclude right suprahilar interstitial or airspace disease. This could be artifactual and technique related.  IMPRESSION: Multifactorial degradation. Possible right-sided interstitial or airspace process. Alternatively, this could be artifactual. Especially if there are cardiopulmonary symptoms, PA and lateral radiographs should be attempted.   Electronically Signed   By: [name] M.D.   On: 09/07/2013 09:58    Review of Systems  Constitutional: Positive for malaise/fatigue. Negative for fever, chills and weight loss.  Cardiovascular: Negative for chest pain.  Skin: Negative for rash.  Neurological: Negative for dizziness.  Endo/Heme/Allergies:       Pos for diabetes  Psychiatric/Behavioral: Positive for depression. Negative for suicidal ideas.    Blood pressure 150/75, pulse 63, resp. rate 18, height 5\' 2"  (1.575 m), weight 98.431 kg (217 lb), SpO2 97.00%. Physical Exam  Constitutional: She appears well-developed.  Eyes:  legallly blind both eyes  Neck: Normal range of motion.  Cardiovascular: Normal rate.   Respiratory: Effort normal.  GI: Soft.  Musculoskeletal:  3rd toe necrosis with granulation tissue of 1cm on dorsal toe.   Neurological:  Peripheral neuropathy with stocking numbness of LE.      Assessment/Plan Right transmet for osteo and necrosis.  Risks of nonhealing discussed. All ?'s answered.  Possibility of BKA if transmet does not heal  [name] C 09/07/2013, 11:28 AM

## 2013-09-07 NOTE — Discharge Instructions (Signed)
Keep dressing dry and clean.  Elevate foot when at rest.  Non weight bearing on surgical foot. Call if fever or chills, malodor or copious drainage from foot.

## 2013-09-07 NOTE — Progress Notes (Signed)
R IJ D/C per MD order/cath intact/vaseline pressure dsg applied/pressure x5 min/no bleeding noted. Burman Freestone, RN

## 2013-09-07 NOTE — Anesthesia Preprocedure Evaluation (Addendum)
Anesthesia Evaluation  Patient identified by MRN, date of birth, ID band Patient awake    Reviewed: Allergy & Precautions, H&P , NPO status , Patient's Chart, lab work & pertinent test results  History of Anesthesia Complications (+) DIFFICULT IV STICK / SPECIAL LINE and history of anesthetic complications  Airway Mallampati: II  Neck ROM: Full    Dental  (+) Edentulous Upper, Edentulous Lower   Pulmonary former smoker,  breath sounds clear to auscultation        Cardiovascular hypertension, + Peripheral Vascular Disease Rhythm:Regular Rate:Normal     Neuro/Psych TIACVA    GI/Hepatic GERD-  ,  Endo/Other  diabetesMorbid obesity  Renal/GU      Musculoskeletal   Abdominal   Peds  Hematology   Anesthesia Other Findings   Reproductive/Obstetrics                         Anesthesia Physical Anesthesia Plan  ASA: III  Anesthesia Plan: General   Post-op Pain Management:    Induction: Intravenous  Airway Management Planned: LMA  Additional Equipment:   Intra-op Plan:   Post-operative Plan: Extubation in OR  Informed Consent: I have reviewed the patients History and Physical, chart, labs and discussed the procedure including the risks, benefits and alternatives for the proposed anesthesia with the patient or authorized representative who has indicated his/her understanding and acceptance.     Plan Discussed with:   Anesthesia Plan Comments:         Anesthesia Quick Evaluation

## 2013-09-07 NOTE — Interval H&P Note (Signed)
History and Physical Interval Note:  09/07/2013 11:33 AM  [name]  has presented today for surgery, with the diagnosis of Osteomyelitis RIGHT 3RD, 4TH AND 5TH METATARPAL HEAD  The various methods of treatment have been discussed with the patient and family. After consideration of risks, benefits and other options for treatment, the patient has consented to  Procedure(s) with comments: AMPUTATION RAY (Right) - Right Transmetatarsal Amputation as a surgical intervention .  The patient's history has been reviewed, patient examined, no change in status, stable for surgery.  I have reviewed the patient's chart and labs.  Questions were answered to the patient's satisfaction.  Pt sent to me by Dr. Marily Memos for surgical treatment.    Rody Keadle C

## 2013-09-07 NOTE — Transfer of Care (Signed)
Immediate Anesthesia Transfer of Care Note  Patient: [name]  Procedure(s) Performed: Procedure(s) with comments: AMPUTATION RAY (Right) - Right Transmetatarsal Amputation  Patient Location: PACU  Anesthesia Type:General  Level of Consciousness: awake, alert , oriented and patient cooperative  Airway & Oxygen Therapy: Patient Spontanous Breathing and Patient connected to nasal cannula oxygen  Post-op Assessment: Report given to PACU RN, Post -op Vital signs reviewed and stable and Patient moving all extremities X 4  Post vital signs: Reviewed and stable  Complications: No apparent anesthesia complications

## 2013-09-07 NOTE — Anesthesia Procedure Notes (Signed)
Procedure Name: Intubation Date/Time: 09/07/2013 12:00 PM Performed by: Ned Grace Pre-anesthesia Checklist: Patient identified, Patient being monitored, Emergency Drugs available, Timeout performed and Suction available Patient Re-evaluated:Patient Re-evaluated prior to inductionOxygen Delivery Method: Circle system utilized Preoxygenation: Pre-oxygenation with 100% oxygen Intubation Type: IV induction Ventilation: Mask ventilation without difficulty Laryngoscope Size: Mac and 4 (Gr I view with shoulder roll in place and lip retraction) Grade View: Grade I Tube type: Oral Tube size: 7.0 mm Number of attempts: 1 Airway Equipment and Method: Stylet Placement Confirmation: ETT inserted through vocal cords under direct vision,  breath sounds checked- equal and bilateral and positive ETCO2 Secured at: 21 cm Tube secured with: Tape Dental Injury: Teeth and Oropharynx as per pre-operative assessment

## 2013-09-07 NOTE — Anesthesia Postprocedure Evaluation (Signed)
  Anesthesia Post-op Note  Patient: [name]  Procedure(s) Performed: Procedure(s) with comments: AMPUTATION RAY (Right) - Right Transmetatarsal Amputation  Patient Location: PACU  Anesthesia Type:General  Level of Consciousness: awake and alert   Airway and Oxygen Therapy: Patient Spontanous Breathing  Post-op Pain: mild  Post-op Assessment: Post-op Vital signs reviewed  Post-op Vital Signs: stable  Last Vitals:  Filed Vitals:   09/07/13 1330  BP: 155/80  Pulse: 65  Temp:   Resp: 13    Complications: No apparent anesthesia complications

## 2013-09-07 NOTE — Progress Notes (Signed)
Pt discharged to [place] living with [facility]

## 2013-09-08 NOTE — Op Note (Signed)
NAMELUCYLLE, [name] NO.:  [id number]  MEDICAL RECORD NO.:  [id number]  LOCATION:  [facility]                         FACILITY:  [facility]  PHYSICIAN:  Mark C. Lorin Mercy, M.D.    DATE OF BIRTH:  July 28, 1940  DATE OF PROCEDURE:  09/07/2013 DATE OF DISCHARGE:  09/07/2013                              OPERATIVE REPORT   PREOPERATIVE DIAGNOSIS:  Osteomyelitis third, fourth, and fifth toes and metatarsal heads.  POSTOPERATIVE DIAGNOSIS:  Osteomyelitis third, fourth, and fifth toes and metatarsal heads.  PROCEDURE:  Right transmetatarsal amputation.  SURGEON:  Mark C. Lorin Mercy, M.D.  ANESTHESIA:  General LMA.  TOURNIQUET:  Esmarch tourniquet less than 40 minutes.  COMPLICATIONS:  None.  INDICATIONS FOR PROCEDURE:  A 73 year old female, long-term diabetic, blind, renal failure, previous second ray amputation with infected third toe with ulceration over the PIP joint, 1 cm, with granulation tissue and chronic drainage.  Plain radiograph showed osteomyelitis involving the third, fourth, and fifth toes involving the metatarsal heads as well.  Preoperative discussion with the patient was for trans met versus below-knee amputation.  She is a non ambulator and has severe contractures and rolls on a wheelchair and is a chronic nursing home patient.  DESCRIPTION OF PROCEDURE:  After induction of anesthesia and prepping with DuraPrep, the usual extremity sheets and drapes were applied.  Time- out procedure completed.  Esmarch tourniquet applied.  Six wraps around the ankle starting at the tip of the toes and then wrapping from distal. Skin marker was used for a trans met amputation.  Dorsally, the bones were exposed first and plantar skin flap was over the metatarsal heads at the base of the toes.  Skin flaps were kept deep on the plantar surface.  Oscillating saw was used to perform the trans met going to first through fifth metatarsals and then coagulating digital arteries with the  cautery.  The flexor tendons were cut on plantar surface. Amputated specimen was removed and sent to pathology.  Esmarch was removed.  Digital artery between the great toe and second toe was pumping, calcified by x-ray, and was coagulated with cautery.  There was some good capillary refill on skin edges with copious irrigation.  No evidence of osteomyelitis with gross examination at the level of the amputation of the metatarsals.  Repeat copious irrigation and then horizontal mattress sutures with the knots tied on the dorsal side with 3-0 nylon.  Adaptic, 4x4s, ABD, Webril, and Coban was applied for postoperative dressing.  Instrument count and needle count was correct.     Mark C. Lorin Mercy, M.D.     MCY/MEDQ  D:  09/07/2013  T:  09/07/2013  Job:  [id number]

## 2013-09-10 ENCOUNTER — Other Ambulatory Visit: Payer: Self-pay | Admitting: *Deleted

## 2013-09-10 ENCOUNTER — Encounter (HOSPITAL_COMMUNITY): Payer: Self-pay | Admitting: Orthopaedic Surgery

## 2013-09-10 MED ORDER — HYDROCODONE-ACETAMINOPHEN 5-325 MG PO TABS: 1.0000 | ORAL_TABLET | ORAL | Status: DC | PRN

## 2013-09-16 ENCOUNTER — Non-Acute Institutional Stay (SKILLED_NURSING_FACILITY): Payer: Medicare Other | Admitting: Internal Medicine

## 2013-09-16 ENCOUNTER — Encounter: Payer: Self-pay | Admitting: Internal Medicine

## 2013-09-16 DIAGNOSIS — F4321 Adjustment disorder with depressed mood: Secondary | ICD-10-CM

## 2013-09-16 DIAGNOSIS — M86679 Other chronic osteomyelitis, unspecified ankle and foot: Secondary | ICD-10-CM

## 2013-09-16 DIAGNOSIS — Z89431 Acquired absence of right foot: Secondary | ICD-10-CM

## 2013-09-16 DIAGNOSIS — F329 Major depressive disorder, single episode, unspecified: Secondary | ICD-10-CM

## 2013-09-16 DIAGNOSIS — I798 Other disorders of arteries, arterioles and capillaries in diseases classified elsewhere: Secondary | ICD-10-CM

## 2013-09-16 DIAGNOSIS — S98919A Complete traumatic amputation of unspecified foot, level unspecified, initial encounter: Secondary | ICD-10-CM

## 2013-09-16 DIAGNOSIS — E1151 Type 2 diabetes mellitus with diabetic peripheral angiopathy without gangrene: Secondary | ICD-10-CM

## 2013-09-16 DIAGNOSIS — M86671 Other chronic osteomyelitis, right ankle and foot: Secondary | ICD-10-CM

## 2013-09-16 DIAGNOSIS — N183 Chronic kidney disease, stage 3 unspecified: Secondary | ICD-10-CM

## 2013-09-16 DIAGNOSIS — E1129 Type 2 diabetes mellitus with other diabetic kidney complication: Secondary | ICD-10-CM

## 2013-09-16 DIAGNOSIS — I739 Peripheral vascular disease, unspecified: Secondary | ICD-10-CM

## 2013-09-16 DIAGNOSIS — E1159 Type 2 diabetes mellitus with other circulatory complications: Secondary | ICD-10-CM

## 2013-09-16 NOTE — Progress Notes (Signed)
Patient ID: [name], female   DOB: [birth year], 73 y.o.   MRN: [id number]  Location:  [facility] Provider:  Rexene Edison. Mariea Clonts, D.O., C.M.D.  Code Status:  Full code  Chief Complaint  Patient presents with  . Medical Management of Chronic Issues    s/p right transmetatarsal amputation due to Osteomyelitis third, fourth, and fifth toes    HPI:  73 yo black female with DMII with circulatory complications/PAD here for long term care was seen for medical mgt of her chronic diseases.  She had been having difficulty with right third toe necrosis and infection of her right 3rd, 4th and 5th toes and underwent right transmetatarsal amputation on 09/07/13 by Dr. Rodell Perna.  Staff have noted that she is sleeping a lot, not as conversive and off and on complaining of pain in the right leg.  She admits to me that she is depressed after losing her toes. She does not seem to want to talk to me much. Normally, she is more sociable.  Review of Systems:  Review of Systems  Constitutional: Negative for fever.  HENT: Negative for congestion.   Eyes:       Blind  Respiratory: Negative for shortness of breath.   Cardiovascular: Negative for chest pain and leg swelling.  Gastrointestinal: Negative for constipation.  Genitourinary: Negative for dysuria.  Musculoskeletal: Negative for falls.       Pain in right foot/leg  Skin: Negative for rash.  Neurological: Negative for dizziness.  Psychiatric/Behavioral: Positive for depression. The patient has insomnia.        Says she does not always sleep well at night, but other nights does ok    Medications: Patient's Medications  New Prescriptions   No medications on file  Previous Medications   ACETAMINOPHEN (TYLENOL) 325 MG TABLET    Take 650 mg by mouth every 6 (six) hours as needed for fever.    ACETAMINOPHEN (TYLENOL) 325 MG TABLET    Take 650 mg by mouth 3 (three) times daily.   ASPIRIN EC 325 MG TABLET    Take 325 mg by mouth daily.      BRINZOLAMIDE-BRIMONIDINE (SIMBRINZA) 1-0.2 % SUSP    Place 1 drop into the left eye 2 (two) times daily.   CARVEDILOL (COREG) 3.125 MG TABLET    Take 1 tablet (3.125 mg total) by mouth daily with breakfast.   CARVEDILOL (COREG) 3.125 MG TABLET    Take 3.125 mg by mouth 2 (two) times daily with a meal.   DOCUSATE SODIUM (COLACE) 100 MG CAPSULE    Take 100 mg by mouth 2 (two) times daily.   FUROSEMIDE (LASIX) 40 MG TABLET    Take 40 mg by mouth daily.   GABAPENTIN (NEURONTIN) 100 MG CAPSULE    Take 100 mg by mouth at bedtime.   HYDROCODONE-ACETAMINOPHEN (NORCO) 5-325 MG PER TABLET    Take 1-2 tablets by mouth every 4 (four) hours as needed for moderate pain.   HYDROCORTISONE (ANUSOL-HC) 2.5 % RECTAL CREAM    Place 1 application rectally 2 (two) times daily as needed for hemorrhoids.    INSULIN DETEMIR (LEVEMIR) 100 UNIT/ML INJECTION    Inject 12 Units into the skin at bedtime.   INSULIN LISPRO (HUMALOG KWIKPEN) 100 UNIT/ML KIWKPEN    Inject 5 Units into the skin 3 (three) times daily before meals.   IPRATROPIUM-ALBUTEROL (DUONEB) 0.5-2.5 (3) MG/3ML SOLN    Take 3 mLs by nebulization every 6 (six) hours as needed (wheezing).  METOCLOPRAMIDE (REGLAN) 5 MG TABLET    Take 5 mg by mouth 3 (three) times daily.   MULTIPLE VITAMINS-MINERALS (MULTIVITAMIN WITH MINERALS) TABLET    Take 1 tablet by mouth daily.   POLYETHYLENE GLYCOL (MIRALAX / GLYCOLAX) PACKET    Take 17 g by mouth daily.   PROTEIN SUPPLEMENT (RESOURCE BENEPROTEIN) POWD    Take 1 scoop by mouth 3 (three) times daily with meals.   SIMVASTATIN (ZOCOR) 20 MG TABLET    Take 20 mg by mouth daily.   SODIUM CHLORIDE (OCEAN) 0.65 % SOLN NASAL SPRAY    Place 1 spray into the nose 4 (four) times daily as needed for congestion.  Modified Medications   No medications on file  Discontinued Medications   No medications on file    Physical Exam: Filed Vitals:   09/16/13 1452  BP: 128/69  Pulse: 69  Temp: 97.5 F (36.4 C)  Resp: 16  Height:  5\' 2"  (1.575 m)  Weight: 219 lb (99.338 kg)  SpO2: 96%  Physical Exam  Constitutional: She is oriented to person, place, and time.  Obese black female resting in bed  HENT:  Head: Normocephalic and atraumatic.  Eyes: Pupils are equal, round, and reactive to light.  Neck: Neck supple. No JVD present.  Cardiovascular: Normal rate, regular rhythm, normal heart sounds and intact distal pulses.   Pulmonary/Chest: Effort normal and breath sounds normal. No respiratory distress.  Abdominal: Soft. Bowel sounds are normal. She exhibits no distension and no mass. There is no tenderness.  Musculoskeletal: Normal range of motion.  Right transmetatarsal amputation  Neurological: She is alert and oriented to person, place, and time.  Skin:  Right transmetatarsal amputation was just dressed with clean gause  Psychiatric: Thought content normal.  Flat affect today    Labs reviewed: Basic Metabolic Panel:  Recent Labs  09/07/13 1002  NA 137  K 5.2  CL 99  CO2 21  GLUCOSE 113*  BUN 43*  CREATININE 0.98  CALCIUM 8.8    Liver Function Tests:  Recent Labs  09/07/13 1002  AST 33  ALT 17  ALKPHOS 91  BILITOT 0.3  PROT 7.3  ALBUMIN 3.1*    CBC:  Recent Labs  09/07/13 1002  WBC 6.2  HGB 11.9*  HCT 38.9  MCV 90.3  PLT 203    Significant Diagnostic Results:  CXR 09/07/13:  Multifactorial degradation. Possible right-sided interstitial or airspace process. Alternatively, this could be artifactual. Especially if there are cardiopulmonary symptoms, PA and lateral radiographs should be attempted  Assessment/Plan 1. Chronic osteomyelitis of toe of right foot -is now s/p transmetatarsal amputation -being followed by treatment nurse and wound care physician from [facility] -also keep f/u as planned with Dr. Lorin Mercy 9/1  2. Type 2 diabetes, controlled, with peripheral circulatory disorder -upon review of CBGs, they are well controlled primarily in the 100s and without lows on her current  concho diet, asa, levemir 12 units with humalog meal coverage of 5 units plus 5 more if cbg over 150  3. PAD (peripheral artery disease) -suspected with necrosis of toes, but I do not see ABIs and arterial doppler in epic (may have been done externally)  4. Chronic renal failure, stage 3 (moderate) -due to diabetes, hypertension longstanding -cont secondary prevention -cannot tolerate ace with renal dysfunction at this point  5. Reactive depression (situational) -PHQ 9 done -in context of amputation of toes -is sleeping a lot, intake down and admits to depression -start cymbalta 30mg  daily for 2  wks, then increase to 60mg  thereafter  6. Type 2 diabetes, controlled, with renal manifestation -well controlled based on cbgs, I was unable to access her paper snf chart today to determine when her last hba1c was done and has not yet been abstracted -should be every 3-4 mos  7. Status post transmetatarsal amputation of right foot -monitor for healing, being followed by treatment nurse and [facility] wound care -having increased pain, will resume gabapentin 300mg  po bid which was said to be on hold? On mar  Family/ staff Communication: discussed with her nurse today  Goals of care: long term care resident  Labs/tests ordered:  F/u Dr. Lorin Mercy, may need hba1c if not done in past 3 mos

## 2013-09-19 ENCOUNTER — Encounter: Payer: Self-pay | Admitting: Internal Medicine

## 2013-09-19 DIAGNOSIS — M86671 Other chronic osteomyelitis, right ankle and foot: Secondary | ICD-10-CM

## 2013-09-19 DIAGNOSIS — F329 Major depressive disorder, single episode, unspecified: Secondary | ICD-10-CM | POA: Insufficient documentation

## 2013-09-19 DIAGNOSIS — Z89431 Acquired absence of right foot: Secondary | ICD-10-CM | POA: Insufficient documentation

## 2013-09-19 DIAGNOSIS — I739 Peripheral vascular disease, unspecified: Secondary | ICD-10-CM | POA: Insufficient documentation

## 2013-09-19 HISTORY — DX: Other chronic osteomyelitis, right ankle and foot: M86.671

## 2013-09-19 MED ORDER — DULOXETINE HCL 30 MG PO CPEP: 30.0000 mg | ORAL_CAPSULE | Freq: Every day | ORAL | Status: DC

## 2013-09-19 MED ORDER — DULOXETINE HCL 60 MG PO CPEP: 60.0000 mg | ORAL_CAPSULE | Freq: Every day | ORAL | Status: DC

## 2013-09-30 ENCOUNTER — Non-Acute Institutional Stay (SKILLED_NURSING_FACILITY): Payer: Medicare Other | Admitting: Internal Medicine

## 2013-09-30 ENCOUNTER — Encounter: Payer: Self-pay | Admitting: Internal Medicine

## 2013-09-30 DIAGNOSIS — N3000 Acute cystitis without hematuria: Secondary | ICD-10-CM

## 2013-09-30 DIAGNOSIS — R338 Other retention of urine: Secondary | ICD-10-CM

## 2013-09-30 DIAGNOSIS — N3001 Acute cystitis with hematuria: Secondary | ICD-10-CM

## 2013-09-30 NOTE — Progress Notes (Signed)
Patient ID: [name], female   DOB: November 25, 1940, 73 y.o.   MRN: [id number]  Location:  [facility] Provider:  Rexene Edison. Mariea Clonts, D.O., C.M.D.  Code Status:  Full code   Chief Complaint  Patient presents with  . Acute Visit    urinary retention said to be 2500cc yesterday with positive UA with 3+LE, negative nitrite, 3+ blood, 20-30 wbc, 5-10 rbc, 3+ bacteria; foley placed    HPI:  73 yo black female long term care resident seen for acute visit due to urinary retention yesterday.  Required foley catheter placement.  Has pending urine culture.    Review of Systems:  Review of Systems  Constitutional: Positive for malaise/fatigue. Negative for fever and chills.  HENT: Negative for congestion.   Respiratory: Negative for shortness of breath.   Cardiovascular: Negative for chest pain and leg swelling.  Gastrointestinal: Negative for abdominal pain and constipation.  Genitourinary: Positive for hematuria. Negative for dysuria, urgency and frequency.  Musculoskeletal: Negative for falls.  Skin: Negative for rash.  Neurological: Positive for weakness.    Medications: Patient's Medications  New Prescriptions   No medications on file  Previous Medications   ACETAMINOPHEN (TYLENOL) 325 MG TABLET    Take 650 mg by mouth every 6 (six) hours as needed for fever.    ACETAMINOPHEN (TYLENOL) 325 MG TABLET    Take 650 mg by mouth 3 (three) times daily.   ASPIRIN EC 325 MG TABLET    Take 325 mg by mouth daily.     BRINZOLAMIDE-BRIMONIDINE (SIMBRINZA) 1-0.2 % SUSP    Place 1 drop into the left eye 2 (two) times daily.   CARVEDILOL (COREG) 3.125 MG TABLET    Take 1 tablet (3.125 mg total) by mouth daily with breakfast.   CARVEDILOL (COREG) 3.125 MG TABLET    Take 3.125 mg by mouth 2 (two) times daily with a meal.   DOCUSATE SODIUM (COLACE) 100 MG CAPSULE    Take 100 mg by mouth 2 (two) times daily.   DULOXETINE (CYMBALTA) 30 MG CAPSULE    Take 1 capsule (30 mg total) by mouth  daily. For depression and neuropathic pain   DULOXETINE (CYMBALTA) 60 MG CAPSULE    Take 1 capsule (60 mg total) by mouth daily. When completes 2 wks at 30mg    FUROSEMIDE (LASIX) 40 MG TABLET    Take 40 mg by mouth daily.   GABAPENTIN (NEURONTIN) 100 MG CAPSULE    Take 300 mg by mouth 2 (two) times daily. Phantom pain   HYDROCODONE-ACETAMINOPHEN (NORCO/VICODIN) 5-325 MG PER TABLET    Take 1 tablet by mouth every 6 (six) hours.   HYDROCORTISONE (ANUSOL-HC) 2.5 % RECTAL CREAM    Place 1 application rectally 2 (two) times daily as needed for hemorrhoids.    INSULIN DETEMIR (LEVEMIR) 100 UNIT/ML INJECTION    Inject 12 Units into the skin at bedtime.   INSULIN LISPRO (HUMALOG KWIKPEN) 100 UNIT/ML KIWKPEN    Inject 5 Units into the skin 3 (three) times daily before meals.   INSULIN LISPRO (HUMALOG) 100 UNIT/ML INJECTION    Inject 5 Units into the skin 3 (three) times daily before meals. As needed if cbg still over 150   IPRATROPIUM-ALBUTEROL (DUONEB) 0.5-2.5 (3) MG/3ML SOLN    Take 3 mLs by nebulization every 6 (six) hours as needed (wheezing).   METOCLOPRAMIDE (REGLAN) 5 MG TABLET    Take 5 mg by mouth 3 (three) times daily.   MULTIPLE VITAMINS-MINERALS (MULTIVITAMIN WITH MINERALS) TABLET  Take 1 tablet by mouth daily.   OXYCODONE-ACETAMINOPHEN (PERCOCET/ROXICET) 5-325 MG PER TABLET    Take 1 tablet by mouth every 4 (four) hours as needed for severe pain.   POLYETHYLENE GLYCOL (MIRALAX / GLYCOLAX) PACKET    Take 17 g by mouth daily.   PROTEIN SUPPLEMENT (RESOURCE BENEPROTEIN) POWD    Take 1 scoop by mouth 3 (three) times daily with meals.   SIMVASTATIN (ZOCOR) 20 MG TABLET    Take 20 mg by mouth daily.   SODIUM CHLORIDE (OCEAN) 0.65 % SOLN NASAL SPRAY    Place 1 spray into the nose 4 (four) times daily as needed for congestion.  Modified Medications   No medications on file  Discontinued Medications   No medications on file    Physical Exam: Filed Vitals:   09/30/13 1505  BP: 134/73  Pulse:  78  Temp: 98 F (36.7 C)  Resp: 17  Height: 5\' 2"  (1.575 m)  Weight: 209 lb (94.802 kg)  SpO2: 97%  Physical Exam  Constitutional:  Obese black female resting in bed  Cardiovascular: Normal rate, regular rhythm, normal heart sounds and intact distal pulses.   Pulmonary/Chest: Effort normal and breath sounds normal. No respiratory distress.  Abdominal: Soft. Bowel sounds are normal. She exhibits no distension. There is no tenderness.  Genitourinary:  Foley with dark yellow urine  Neurological:  lethargic  Skin: Skin is warm and dry.  Psychiatric: She has a normal mood and affect.    Labs reviewed: Basic Metabolic Panel:  Recent Labs  09/07/13 1002  NA 137  K 5.2  CL 99  CO2 21  GLUCOSE 113*  BUN 43*  CREATININE 0.98  CALCIUM 8.8    Liver Function Tests:  Recent Labs  09/07/13 1002  AST 33  ALT 17  ALKPHOS 91  BILITOT 0.3  PROT 7.3  ALBUMIN 3.1*    CBC:  Recent Labs  09/07/13 1002  WBC 6.2  HGB 11.9*  HCT 38.9  MCV 90.3  PLT 203    See + UA above from 9/1  Assessment/Plan 1. Acute cystitis with hematuria -await culture results -encourage po fluids -check cbc, bmp  2. Acute urinary retention -2500 cc last night -had foley placed -refer to urology   Family/ staff Communication: discussed with her nurse  Goals of care: long term care, full code  Labs/tests ordered:  Urology referral

## 2013-10-01 NOTE — Progress Notes (Signed)
Patient ID: [name], female   DOB: [birth year], 73 y.o.   MRN: [id number] This was a hospital follow up visit after her amputation and she was discharged within 14 days of this visit and I also managed her chronic diseases.

## 2013-10-09 ENCOUNTER — Other Ambulatory Visit: Payer: Self-pay | Admitting: *Deleted

## 2013-10-09 MED ORDER — HYDROCODONE-ACETAMINOPHEN 5-325 MG PO TABS: ORAL_TABLET | ORAL | Status: DC

## 2013-10-09 NOTE — Telephone Encounter (Signed)
[facility] 

## 2013-10-19 NOTE — Progress Notes (Signed)
Patient ID: [name], female   DOB: 08-18-1940, 73 y.o.   MRN: [id number] Pt is in SNF.  This was a f/u after she was hospitalized for surgery and thorough review of her ongoing chronic conditions was performed and review of her hospitalization.

## 2013-10-19 NOTE — Addendum Note (Signed)
Addended by: Gayland Curry on: 10/19/2013 06:17 PM   Modules accepted: Level of Service

## 2013-10-21 ENCOUNTER — Other Ambulatory Visit: Payer: Self-pay | Admitting: *Deleted

## 2013-10-21 MED ORDER — OXYCODONE-ACETAMINOPHEN 5-325 MG PO TABS: ORAL_TABLET | ORAL | Status: DC

## 2013-10-21 NOTE — Telephone Encounter (Signed)
[facility] 

## 2013-10-26 ENCOUNTER — Other Ambulatory Visit: Payer: Self-pay | Admitting: *Deleted

## 2013-10-26 MED ORDER — HYDROCODONE-ACETAMINOPHEN 5-325 MG PO TABS: ORAL_TABLET | ORAL | Status: DC

## 2013-10-26 NOTE — Telephone Encounter (Signed)
[facility] 

## 2013-12-17 ENCOUNTER — Inpatient Hospital Stay (HOSPITAL_COMMUNITY)
Admission: EM | Admit: 2013-12-17 | Discharge: 2013-12-22 | DRG: 698 | Disposition: A | Discharge status: Skilled Nursing Facility | Payer: Medicare Other | Attending: Internal Medicine | Admitting: Internal Medicine

## 2013-12-17 DIAGNOSIS — R339 Retention of urine, unspecified: Secondary | ICD-10-CM | POA: Diagnosis present

## 2013-12-17 DIAGNOSIS — Z8673 Personal history of transient ischemic attack (TIA), and cerebral infarction without residual deficits: Secondary | ICD-10-CM

## 2013-12-17 DIAGNOSIS — A499 Bacterial infection, unspecified: Secondary | ICD-10-CM

## 2013-12-17 DIAGNOSIS — G9389 Other specified disorders of brain: Secondary | ICD-10-CM | POA: Diagnosis present

## 2013-12-17 DIAGNOSIS — I10 Essential (primary) hypertension: Secondary | ICD-10-CM | POA: Diagnosis present

## 2013-12-17 DIAGNOSIS — T8351XA Infection and inflammatory reaction due to indwelling urinary catheter, initial encounter: Secondary | ICD-10-CM | POA: Diagnosis not present

## 2013-12-17 DIAGNOSIS — N3000 Acute cystitis without hematuria: Secondary | ICD-10-CM

## 2013-12-17 DIAGNOSIS — Z7401 Bed confinement status: Secondary | ICD-10-CM

## 2013-12-17 DIAGNOSIS — G9340 Encephalopathy, unspecified: Secondary | ICD-10-CM | POA: Diagnosis present

## 2013-12-17 DIAGNOSIS — L24A9 Irritant contact dermatitis due friction or contact with other specified body fluids: Secondary | ICD-10-CM

## 2013-12-17 DIAGNOSIS — Y835 Amputation of limb(s) as the cause of abnormal reaction of the patient, or of later complication, without mention of misadventure at the time of the procedure: Secondary | ICD-10-CM | POA: Diagnosis present

## 2013-12-17 DIAGNOSIS — G459 Transient cerebral ischemic attack, unspecified: Secondary | ICD-10-CM | POA: Diagnosis present

## 2013-12-17 DIAGNOSIS — Z1612 Extended spectrum beta lactamase (ESBL) resistance: Secondary | ICD-10-CM

## 2013-12-17 DIAGNOSIS — E11649 Type 2 diabetes mellitus with hypoglycemia without coma: Secondary | ICD-10-CM | POA: Diagnosis present

## 2013-12-17 DIAGNOSIS — B961 Klebsiella pneumoniae [K. pneumoniae] as the cause of diseases classified elsewhere: Secondary | ICD-10-CM | POA: Diagnosis present

## 2013-12-17 DIAGNOSIS — N183 Chronic kidney disease, stage 3 unspecified: Secondary | ICD-10-CM | POA: Diagnosis present

## 2013-12-17 DIAGNOSIS — F17220 Nicotine dependence, chewing tobacco, uncomplicated: Secondary | ICD-10-CM | POA: Diagnosis present

## 2013-12-17 DIAGNOSIS — H547 Unspecified visual loss: Secondary | ICD-10-CM | POA: Diagnosis present

## 2013-12-17 DIAGNOSIS — E1151 Type 2 diabetes mellitus with diabetic peripheral angiopathy without gangrene: Secondary | ICD-10-CM | POA: Diagnosis present

## 2013-12-17 DIAGNOSIS — K219 Gastro-esophageal reflux disease without esophagitis: Secondary | ICD-10-CM | POA: Diagnosis present

## 2013-12-17 DIAGNOSIS — R4182 Altered mental status, unspecified: Secondary | ICD-10-CM | POA: Diagnosis not present

## 2013-12-17 DIAGNOSIS — N390 Urinary tract infection, site not specified: Secondary | ICD-10-CM | POA: Diagnosis present

## 2013-12-17 DIAGNOSIS — Z89431 Acquired absence of right foot: Secondary | ICD-10-CM

## 2013-12-17 DIAGNOSIS — T148XXA Other injury of unspecified body region, initial encounter: Secondary | ICD-10-CM

## 2013-12-17 DIAGNOSIS — H540 Blindness, both eyes: Secondary | ICD-10-CM | POA: Diagnosis present

## 2013-12-17 DIAGNOSIS — T8781 Dehiscence of amputation stump: Secondary | ICD-10-CM | POA: Diagnosis present

## 2013-12-17 DIAGNOSIS — G919 Hydrocephalus, unspecified: Secondary | ICD-10-CM | POA: Diagnosis present

## 2013-12-17 DIAGNOSIS — Z7982 Long term (current) use of aspirin: Secondary | ICD-10-CM

## 2013-12-17 DIAGNOSIS — R197 Diarrhea, unspecified: Secondary | ICD-10-CM | POA: Diagnosis present

## 2013-12-17 DIAGNOSIS — T68XXXA Hypothermia, initial encounter: Secondary | ICD-10-CM | POA: Diagnosis present

## 2013-12-17 DIAGNOSIS — M86679 Other chronic osteomyelitis, unspecified ankle and foot: Secondary | ICD-10-CM | POA: Diagnosis present

## 2013-12-17 DIAGNOSIS — I129 Hypertensive chronic kidney disease with stage 1 through stage 4 chronic kidney disease, or unspecified chronic kidney disease: Secondary | ICD-10-CM | POA: Diagnosis present

## 2013-12-17 DIAGNOSIS — Y846 Urinary catheterization as the cause of abnormal reaction of the patient, or of later complication, without mention of misadventure at the time of the procedure: Secondary | ICD-10-CM | POA: Diagnosis present

## 2013-12-17 DIAGNOSIS — I739 Peripheral vascular disease, unspecified: Secondary | ICD-10-CM | POA: Diagnosis present

## 2013-12-17 DIAGNOSIS — E785 Hyperlipidemia, unspecified: Secondary | ICD-10-CM | POA: Diagnosis present

## 2013-12-17 DIAGNOSIS — Z794 Long term (current) use of insulin: Secondary | ICD-10-CM

## 2013-12-17 DIAGNOSIS — M170 Bilateral primary osteoarthritis of knee: Secondary | ICD-10-CM | POA: Diagnosis present

## 2013-12-17 DIAGNOSIS — D649 Anemia, unspecified: Secondary | ICD-10-CM | POA: Diagnosis present

## 2013-12-17 MED ORDER — SODIUM CHLORIDE 0.9 % IV SOLN
1000.0000 mL | Freq: Once | INTRAVENOUS | Status: AC
  Administered 2013-12-18: 1000 mL via INTRAVENOUS

## 2013-12-17 NOTE — ED Notes (Signed)
Per EMS, [facility] low blood glucose, and then unresponsiveness.  Patient was initially talking with staff, and glucose was checked with reading of 63.  She was given glucagon and rechecked glucose was 60.  Patient went from being able to communicate to lethargic and unresponsive with staff.  On room air, patient's o2 sat read 85%, placed on non rebreather.  CBG en route: 90. Trumpet airway placed in right nares. BP 126/70, p 72.

## 2013-12-18 ENCOUNTER — Emergency Department (HOSPITAL_COMMUNITY): Payer: Medicare Other

## 2013-12-18 ENCOUNTER — Encounter (HOSPITAL_COMMUNITY): Payer: Self-pay

## 2013-12-18 ENCOUNTER — Inpatient Hospital Stay (HOSPITAL_COMMUNITY): Payer: Medicare Other

## 2013-12-18 DIAGNOSIS — Z89431 Acquired absence of right foot: Secondary | ICD-10-CM

## 2013-12-18 DIAGNOSIS — H540 Blindness, both eyes: Secondary | ICD-10-CM | POA: Diagnosis present

## 2013-12-18 DIAGNOSIS — T8781 Dehiscence of amputation stump: Secondary | ICD-10-CM | POA: Diagnosis present

## 2013-12-18 DIAGNOSIS — B961 Klebsiella pneumoniae [K. pneumoniae] as the cause of diseases classified elsewhere: Secondary | ICD-10-CM | POA: Diagnosis present

## 2013-12-18 DIAGNOSIS — Z794 Long term (current) use of insulin: Secondary | ICD-10-CM | POA: Diagnosis not present

## 2013-12-18 DIAGNOSIS — R339 Retention of urine, unspecified: Secondary | ICD-10-CM | POA: Diagnosis present

## 2013-12-18 DIAGNOSIS — H547 Unspecified visual loss: Secondary | ICD-10-CM | POA: Diagnosis present

## 2013-12-18 DIAGNOSIS — F17220 Nicotine dependence, chewing tobacco, uncomplicated: Secondary | ICD-10-CM | POA: Diagnosis present

## 2013-12-18 DIAGNOSIS — K219 Gastro-esophageal reflux disease without esophagitis: Secondary | ICD-10-CM | POA: Insufficient documentation

## 2013-12-18 DIAGNOSIS — E1159 Type 2 diabetes mellitus with other circulatory complications: Secondary | ICD-10-CM

## 2013-12-18 DIAGNOSIS — Z7982 Long term (current) use of aspirin: Secondary | ICD-10-CM | POA: Diagnosis not present

## 2013-12-18 DIAGNOSIS — G9340 Encephalopathy, unspecified: Secondary | ICD-10-CM | POA: Diagnosis present

## 2013-12-18 DIAGNOSIS — E785 Hyperlipidemia, unspecified: Secondary | ICD-10-CM | POA: Diagnosis present

## 2013-12-18 DIAGNOSIS — I10 Essential (primary) hypertension: Secondary | ICD-10-CM | POA: Insufficient documentation

## 2013-12-18 DIAGNOSIS — M170 Bilateral primary osteoarthritis of knee: Secondary | ICD-10-CM | POA: Diagnosis present

## 2013-12-18 DIAGNOSIS — D649 Anemia, unspecified: Secondary | ICD-10-CM | POA: Diagnosis present

## 2013-12-18 DIAGNOSIS — E1151 Type 2 diabetes mellitus with diabetic peripheral angiopathy without gangrene: Secondary | ICD-10-CM | POA: Diagnosis present

## 2013-12-18 DIAGNOSIS — N390 Urinary tract infection, site not specified: Secondary | ICD-10-CM | POA: Diagnosis present

## 2013-12-18 DIAGNOSIS — M86679 Other chronic osteomyelitis, unspecified ankle and foot: Secondary | ICD-10-CM | POA: Diagnosis present

## 2013-12-18 DIAGNOSIS — N183 Chronic kidney disease, stage 3 (moderate): Secondary | ICD-10-CM

## 2013-12-18 DIAGNOSIS — T8351XA Infection and inflammatory reaction due to indwelling urinary catheter, initial encounter: Secondary | ICD-10-CM | POA: Diagnosis present

## 2013-12-18 DIAGNOSIS — T68XXXA Hypothermia, initial encounter: Secondary | ICD-10-CM | POA: Diagnosis present

## 2013-12-18 DIAGNOSIS — G9389 Other specified disorders of brain: Secondary | ICD-10-CM | POA: Diagnosis present

## 2013-12-18 DIAGNOSIS — I739 Peripheral vascular disease, unspecified: Secondary | ICD-10-CM | POA: Diagnosis present

## 2013-12-18 DIAGNOSIS — Z8673 Personal history of transient ischemic attack (TIA), and cerebral infarction without residual deficits: Secondary | ICD-10-CM | POA: Diagnosis not present

## 2013-12-18 DIAGNOSIS — I129 Hypertensive chronic kidney disease with stage 1 through stage 4 chronic kidney disease, or unspecified chronic kidney disease: Secondary | ICD-10-CM | POA: Diagnosis present

## 2013-12-18 DIAGNOSIS — G459 Transient cerebral ischemic attack, unspecified: Secondary | ICD-10-CM | POA: Diagnosis present

## 2013-12-18 DIAGNOSIS — G919 Hydrocephalus, unspecified: Secondary | ICD-10-CM | POA: Diagnosis present

## 2013-12-18 DIAGNOSIS — R197 Diarrhea, unspecified: Secondary | ICD-10-CM | POA: Diagnosis present

## 2013-12-18 DIAGNOSIS — E11649 Type 2 diabetes mellitus with hypoglycemia without coma: Secondary | ICD-10-CM | POA: Diagnosis present

## 2013-12-18 DIAGNOSIS — Y846 Urinary catheterization as the cause of abnormal reaction of the patient, or of later complication, without mention of misadventure at the time of the procedure: Secondary | ICD-10-CM | POA: Diagnosis present

## 2013-12-18 DIAGNOSIS — Y835 Amputation of limb(s) as the cause of abnormal reaction of the patient, or of later complication, without mention of misadventure at the time of the procedure: Secondary | ICD-10-CM | POA: Diagnosis present

## 2013-12-18 DIAGNOSIS — Z7401 Bed confinement status: Secondary | ICD-10-CM | POA: Diagnosis not present

## 2013-12-18 DIAGNOSIS — R4182 Altered mental status, unspecified: Secondary | ICD-10-CM | POA: Diagnosis present

## 2013-12-18 LAB — COMPREHENSIVE METABOLIC PANEL
ALK PHOS: 217 U/L — AB (ref 39–117)
ALT: 17 U/L (ref 0–35)
AST: 49 U/L — AB (ref 0–37)
Albumin: 2.3 g/dL — ABNORMAL LOW (ref 3.5–5.2)
Anion gap: 12 (ref 5–15)
BILIRUBIN TOTAL: 0.4 mg/dL (ref 0.3–1.2)
BUN: 15 mg/dL (ref 6–23)
CHLORIDE: 98 meq/L (ref 96–112)
CO2: 27 mEq/L (ref 19–32)
Calcium: 8.7 mg/dL (ref 8.4–10.5)
Creatinine, Ser: 0.73 mg/dL (ref 0.50–1.10)
GFR calc Af Amer: >90 mL/min (ref >90)
GFR calc non Af Amer: 84 mL/min — ABNORMAL LOW (ref >90)
Glucose, Bld: 134 mg/dL — ABNORMAL HIGH (ref 70–99)
POTASSIUM: 3.8 meq/L (ref 3.7–5.3)
Sodium: 137 mEq/L (ref 137–147)
Total Protein: 6.8 g/dL (ref 6.0–8.3)

## 2013-12-18 LAB — URINALYSIS, ROUTINE W REFLEX MICROSCOPIC
Bilirubin Urine: NEGATIVE
GLUCOSE, UA: NEGATIVE mg/dL
KETONES UR: NEGATIVE mg/dL
Nitrite: NEGATIVE
Protein, ur: 30 mg/dL — AB
Specific Gravity, Urine: 1.017 (ref 1.005–1.030)
Urobilinogen, UA: 1 mg/dL (ref 0.0–1.0)
pH: 5 (ref 5.0–8.0)

## 2013-12-18 LAB — URINE MICROSCOPIC-ADD ON

## 2013-12-18 LAB — I-STAT ARTERIAL BLOOD GAS, ED
ACID-BASE EXCESS: 3 mmol/L — AB (ref 0.0–2.0)
Bicarbonate: 30.8 mEq/L — ABNORMAL HIGH (ref 20.0–24.0)
O2 SAT: 85 %
PO2 ART: 55 mmHg — AB (ref 80.0–100.0)
Patient temperature: 98.6
TCO2: 33 mmol/L (ref 0–100)
pCO2 arterial: 58.5 mmHg (ref 35.0–45.0)
pH, Arterial: 7.33 — ABNORMAL LOW (ref 7.350–7.450)

## 2013-12-18 LAB — CBC WITH DIFFERENTIAL/PLATELET
BASOS ABS: 0 10*3/uL (ref 0.0–0.1)
Basophils Relative: 0 % (ref 0–1)
Eosinophils Absolute: 0.1 10*3/uL (ref 0.0–0.7)
Eosinophils Relative: 1 % (ref 0–5)
HCT: 36.1 % (ref 36.0–46.0)
HEMOGLOBIN: 11.2 g/dL — AB (ref 12.0–15.0)
Lymphocytes Relative: 8 % — ABNORMAL LOW (ref 12–46)
Lymphs Abs: 0.9 10*3/uL (ref 0.7–4.0)
MCH: 27 pg (ref 26.0–34.0)
MCHC: 31 g/dL (ref 30.0–36.0)
MCV: 87 fL (ref 78.0–100.0)
MONOS PCT: 3 % (ref 3–12)
Monocytes Absolute: 0.3 10*3/uL (ref 0.1–1.0)
NEUTROS ABS: 9.3 10*3/uL — AB (ref 1.7–7.7)
NEUTROS PCT: 88 % — AB (ref 43–77)
Platelets: 269 10*3/uL (ref 150–400)
RBC: 4.15 MIL/uL (ref 3.87–5.11)
RDW: 18.5 % — ABNORMAL HIGH (ref 11.5–15.5)
WBC: 10.6 10*3/uL — AB (ref 4.0–10.5)

## 2013-12-18 LAB — TSH: TSH: 0.858 u[IU]/mL (ref 0.350–4.500)

## 2013-12-18 LAB — I-STAT CG4 LACTIC ACID, ED: Lactic Acid, Venous: 0.86 mmol/L (ref 0.5–2.2)

## 2013-12-18 LAB — PROTIME-INR
INR: 1.04 (ref 0.00–1.49)
Prothrombin Time: 13.8 seconds (ref 11.6–15.2)

## 2013-12-18 LAB — MRSA PCR SCREENING: MRSA BY PCR: NEGATIVE

## 2013-12-18 LAB — CBG MONITORING, ED
GLUCOSE-CAPILLARY: 109 mg/dL — AB (ref 70–99)
GLUCOSE-CAPILLARY: 149 mg/dL — AB (ref 70–99)

## 2013-12-18 LAB — GLUCOSE, CAPILLARY
GLUCOSE-CAPILLARY: 145 mg/dL — AB (ref 70–99)
Glucose-Capillary: 155 mg/dL — ABNORMAL HIGH (ref 70–99)

## 2013-12-18 LAB — TROPONIN I: Troponin I: <0.3 ng/mL (ref <0.30)

## 2013-12-18 LAB — CLOSTRIDIUM DIFFICILE BY PCR: Toxigenic C. Difficile by PCR: NEGATIVE

## 2013-12-18 MED ORDER — BENEPROTEIN PO POWD
1.0000 | Freq: Three times a day (TID) | ORAL | Status: DC
  Administered 2013-12-18 – 2013-12-22 (×14): 6 g via ORAL
  Filled 2013-12-18: qty 227

## 2013-12-18 MED ORDER — SODIUM CHLORIDE 0.9 % IV SOLN
INTRAVENOUS | Status: DC
  Administered 2013-12-18 – 2013-12-22 (×4): via INTRAVENOUS

## 2013-12-18 MED ORDER — HYDROCORTISONE 2.5 % RE CREA: 1.0000 "application " | TOPICAL_CREAM | Freq: Two times a day (BID) | RECTAL | Status: DC | PRN

## 2013-12-18 MED ORDER — BRIMONIDINE TARTRATE 0.2 % OP SOLN
1.0000 [drp] | Freq: Two times a day (BID) | OPHTHALMIC | Status: DC
  Administered 2013-12-18 – 2013-12-22 (×8): 1 [drp] via OPHTHALMIC
  Filled 2013-12-18: qty 5

## 2013-12-18 MED ORDER — GABAPENTIN 300 MG PO CAPS
300.0000 mg | ORAL_CAPSULE | Freq: Two times a day (BID) | ORAL | Status: DC
Start: 2013-12-18 — End: 2013-12-22
  Administered 2013-12-18 – 2013-12-22 (×9): 300 mg via ORAL
  Filled 2013-12-18 (×9): qty 1

## 2013-12-18 MED ORDER — SODIUM CHLORIDE 0.9 % IV SOLN
500.0000 mg | Freq: Three times a day (TID) | INTRAVENOUS | Status: DC
  Administered 2013-12-18 – 2013-12-22 (×14): 500 mg via INTRAVENOUS
  Filled 2013-12-18 (×17): qty 500

## 2013-12-18 MED ORDER — CARVEDILOL 3.125 MG PO TABS
3.1250 mg | ORAL_TABLET | Freq: Two times a day (BID) | ORAL | Status: DC
Start: 2013-12-18 — End: 2013-12-22
  Administered 2013-12-18 – 2013-12-22 (×10): 3.125 mg via ORAL
  Filled 2013-12-18 (×10): qty 1

## 2013-12-18 MED ORDER — DULOXETINE HCL 60 MG PO CPEP
60.0000 mg | ORAL_CAPSULE | Freq: Every day | ORAL | Status: DC
Start: 2013-12-18 — End: 2013-12-22
  Administered 2013-12-18 – 2013-12-22 (×5): 60 mg via ORAL
  Filled 2013-12-18 (×5): qty 1

## 2013-12-18 MED ORDER — SIMVASTATIN 20 MG PO TABS
20.0000 mg | ORAL_TABLET | Freq: Every day | ORAL | Status: DC
  Administered 2013-12-18 – 2013-12-22 (×5): 20 mg via ORAL
  Filled 2013-12-18 (×5): qty 1

## 2013-12-18 MED ORDER — ADULT MULTIVITAMIN W/MINERALS CH
1.0000 | ORAL_TABLET | Freq: Every day | ORAL | Status: DC
  Administered 2013-12-18 – 2013-12-22 (×5): 1 via ORAL
  Filled 2013-12-18 (×9): qty 1

## 2013-12-18 MED ORDER — ACETAMINOPHEN 325 MG PO TABS: 650.0000 mg | ORAL_TABLET | Freq: Four times a day (QID) | ORAL | Status: DC | PRN

## 2013-12-18 MED ORDER — DEXTROSE 50 % IV SOLN: 25.0000 mL | INTRAVENOUS | Status: DC | PRN

## 2013-12-18 MED ORDER — FUROSEMIDE 40 MG PO TABS
40.0000 mg | ORAL_TABLET | Freq: Every day | ORAL | Status: DC
  Administered 2013-12-18 – 2013-12-22 (×5): 40 mg via ORAL
  Filled 2013-12-18 (×5): qty 1

## 2013-12-18 MED ORDER — METOCLOPRAMIDE HCL 5 MG PO TABS
5.0000 mg | ORAL_TABLET | Freq: Three times a day (TID) | ORAL | Status: DC
Start: 2013-12-18 — End: 2013-12-22
  Administered 2013-12-18 – 2013-12-22 (×13): 5 mg via ORAL
  Filled 2013-12-18 (×14): qty 1

## 2013-12-18 MED ORDER — ONDANSETRON HCL 4 MG/2ML IJ SOLN
4.0000 mg | Freq: Four times a day (QID) | INTRAMUSCULAR | Status: DC | PRN
  Administered 2013-12-18 – 2013-12-22 (×2): 4 mg via INTRAVENOUS
  Filled 2013-12-18 (×2): qty 2

## 2013-12-18 MED ORDER — VANCOMYCIN HCL IN DEXTROSE 1-5 GM/200ML-% IV SOLN
1000.0000 mg | Freq: Once | INTRAVENOUS | Status: AC
  Administered 2013-12-18: 1000 mg via INTRAVENOUS
  Filled 2013-12-18: qty 200

## 2013-12-18 MED ORDER — SODIUM CHLORIDE 1 G PO TABS
2.0000 g | ORAL_TABLET | Freq: Three times a day (TID) | ORAL | Status: DC
  Administered 2013-12-18 – 2013-12-22 (×14): 2 g via ORAL
  Filled 2013-12-18 (×16): qty 2

## 2013-12-18 MED ORDER — HYDROCODONE-ACETAMINOPHEN 5-325 MG PO TABS
1.0000 | ORAL_TABLET | Freq: Four times a day (QID) | ORAL | Status: DC | PRN
  Administered 2013-12-21 – 2013-12-22 (×3): 1 via ORAL
  Filled 2013-12-18 (×4): qty 1

## 2013-12-18 MED ORDER — IPRATROPIUM-ALBUTEROL 0.5-2.5 (3) MG/3ML IN SOLN: 3.0000 mL | Freq: Four times a day (QID) | RESPIRATORY_TRACT | Status: DC | PRN

## 2013-12-18 MED ORDER — TIMOLOL MALEATE 0.5 % OP SOLN
1.0000 [drp] | Freq: Two times a day (BID) | OPHTHALMIC | Status: DC
  Administered 2013-12-18 – 2013-12-22 (×9): 1 [drp] via OPHTHALMIC
  Filled 2013-12-18: qty 5

## 2013-12-18 MED ORDER — COLLAGENASE 250 UNIT/GM EX OINT
TOPICAL_OINTMENT | Freq: Every day | CUTANEOUS | Status: DC
  Administered 2013-12-18 – 2013-12-22 (×5): via TOPICAL
  Filled 2013-12-18: qty 30

## 2013-12-18 MED ORDER — CEFTRIAXONE SODIUM IN DEXTROSE 20 MG/ML IV SOLN
1.0000 g | INTRAVENOUS | Status: DC
  Filled 2013-12-18: qty 50

## 2013-12-18 MED ORDER — ASPIRIN EC 325 MG PO TBEC
325.0000 mg | DELAYED_RELEASE_TABLET | Freq: Every day | ORAL | Status: DC
Start: 2013-12-18 — End: 2013-12-22
  Administered 2013-12-18 – 2013-12-22 (×5): 325 mg via ORAL
  Filled 2013-12-18 (×5): qty 1

## 2013-12-18 MED ORDER — PIPERACILLIN-TAZOBACTAM 3.375 G IVPB 30 MIN
3.3750 g | Freq: Once | INTRAVENOUS | Status: AC
  Administered 2013-12-18: 3.375 g via INTRAVENOUS
  Filled 2013-12-18: qty 50

## 2013-12-18 MED ORDER — ONDANSETRON HCL 4 MG PO TABS
4.0000 mg | ORAL_TABLET | Freq: Four times a day (QID) | ORAL | Status: DC | PRN
  Administered 2013-12-18: 4 mg via ORAL
  Filled 2013-12-18: qty 1

## 2013-12-18 MED ORDER — BRINZOLAMIDE 1 % OP SUSP
1.0000 [drp] | Freq: Two times a day (BID) | OPHTHALMIC | Status: DC
  Administered 2013-12-18 – 2013-12-22 (×8): 1 [drp] via OPHTHALMIC
  Filled 2013-12-18: qty 10

## 2013-12-18 MED ORDER — HEPARIN SODIUM (PORCINE) 5000 UNIT/ML IJ SOLN
5000.0000 [IU] | Freq: Three times a day (TID) | INTRAMUSCULAR | Status: DC
  Administered 2013-12-18 – 2013-12-22 (×14): 5000 [IU] via SUBCUTANEOUS
  Filled 2013-12-18 (×14): qty 1

## 2013-12-18 NOTE — ED Notes (Signed)
Second RN attempted IV at this time, unsuccessful. IV team paged.

## 2013-12-18 NOTE — Progress Notes (Addendum)
73yo female presents from NH for low CBG then unresponsiveness, UA suspicious for UTI, has h/o ESBL, to begin IV ABX.  Will start Primaxin 500mg  IV Q8H for wt ~90kg and CrCl ~69ml/min and monitor CBC, Cx.  Wynona Neat, PharmD, BCPS 12/18/2013 7:31 AM

## 2013-12-18 NOTE — ED Notes (Signed)
Dr. Venora Maples aware of rectal temp 95.5. Patient curently covered in warm blankets.

## 2013-12-18 NOTE — ED Notes (Signed)
IV team at the bedside. 

## 2013-12-18 NOTE — ED Notes (Signed)
Patient transported to CT 

## 2013-12-18 NOTE — ED Notes (Signed)
Dr. Venora Maples at the bedside. Patient has bear hugger applied

## 2013-12-18 NOTE — ED Notes (Signed)
Reported diarrhea to dr. Venora Maples. MD gives order for c-diff collection

## 2013-12-18 NOTE — Evaluation (Signed)
Physical Therapy Evaluation Patient Details Name: [name] MRN: [id number] DOB: 11-Sep-1940 Today's Date: 12/18/2013   History of Present Illness  73 yo female who is long term care resident has been admitted for acute encephalopathy related to UTI and hypoglycemia  Clinical Impression  Pt was seen bedside with both nursing and her case manager from SNF arriving at times.  Her plan is to go back to SNF but is tremendously weak, nauseous and limited for tolerance to be OOB.  Will expect PT to see her to continue therapy at SNF.    Follow Up Recommendations SNF    Equipment Recommendations  None recommended by PT    Recommendations for Other Services       Precautions / Restrictions Precautions Precautions: Fall Restrictions Weight Bearing Restrictions: No      Mobility  Bed Mobility Overal bed mobility: +2 for physical assistance;+ 2 for safety/equipment             General bed mobility comments: Pt is completely passive about transitions, and does not initiate any reach or sequencing  Transfers Overall transfer level: Needs assistance Equipment used: 1 person hand held assist Transfers: Sit to/from Stand Sit to Stand: Total assist         General transfer comment: Pt will require a lift to succeed  Ambulation/Gait                Stairs            Wheelchair Mobility    Modified Rankin (Stroke Patients Only)       Balance Overall balance assessment: Needs assistance Sitting-balance support: Bilateral upper extremity supported Sitting balance-Leahy Scale: Poor Sitting balance - Comments: unable to reach floor with her feet but also has a bandage on R foot for recent toe amputation Postural control: Posterior lean;Left lateral lean Standing balance support:  (unable to perform)                                 Pertinent Vitals/Pain Pain Assessment: No/denies pain    Home Living Family/patient expects to be discharged  to:: Skilled nursing facility                      Prior Function Level of Independence: Needs assistance   Gait / Transfers Assistance Needed: Nursing assisted pivot transfers to chair  ADL's / Homemaking Assistance Needed: SNF resident        Hand Dominance   Dominant Hand: Right    Extremity/Trunk Assessment   Upper Extremity Assessment: Generalized weakness           Lower Extremity Assessment: Generalized weakness      Cervical / Trunk Assessment: Normal  Communication   Communication: Other (comment) (confused and unfocused at times)  Cognition Arousal/Alertness: Lethargic Behavior During Therapy: Agitated Overall Cognitive Status: No family/caregiver present to determine baseline cognitive functioning                      General Comments General comments (skin integrity, edema, etc.): 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 now that makes her unable to follow instructions well and unable to tolerate sitting for long    Exercises        Assessment/Plan    PT Assessment Patient needs continued PT services  PT Diagnosis Generalized weakness   PT Problem List Decreased strength;Decreased range of motion;Decreased activity tolerance;Decreased balance;Decreased  mobility;Decreased coordination;Decreased cognition;Decreased safety awareness;Decreased skin integrity;Obesity  PT Treatment Interventions Functional mobility training;Therapeutic activities;Therapeutic exercise;Balance training;Neuromuscular re-education;Patient/family education   PT Goals (Current goals can be found in the Care Plan section) Acute Rehab PT Goals Patient Stated Goal: none stated as pt confused PT Goal Formulation: Patient unable to participate in goal setting Time For Goal Achievement: 01/01/14 Potential to Achieve Goals: Fair    Frequency Min 2X/week   Barriers to discharge Other (comment) (medical concerns only)      Co-evaluation                End of Session Equipment Utilized During Treatment: Other (comment) (none) Activity Tolerance: Patient limited by fatigue;Treatment limited secondary to agitation;Other (comment) (nausea) Patient left: in bed;with call bell/phone within reach;with bed alarm set;with nursing/sitter in room Nurse Communication: Mobility status;Precautions         Time: 8657-8469 PT Time Calculation (min) (ACUTE ONLY): 31 min   Charges:   PT Evaluation $Initial PT Evaluation Tier I: 1 Procedure PT Treatments $Therapeutic Activity: 8-22 mins   PT G Codes:          Ramond Dial 2014-01-07, 1:18 PM   Mee Hives, PT MS Acute Rehab Dept. Number: [phone]

## 2013-12-18 NOTE — ED Notes (Signed)
This RN attempted IV x2, unsuccessful 

## 2013-12-18 NOTE — ED Notes (Signed)
Reported to Dr. Venora Maples that patient's temp is 96.2 with use of hugger, mentation improved and speech clear.  Very little urine output since placement of new foley, less than 30cc.  Also, patient's o2 sats drop when sleeping, but increases back to high 90s when woken up with verbal stimulation. MD acknowledges, no new orders received.

## 2013-12-18 NOTE — Progress Notes (Signed)
CARE MANAGEMENT NOTE 12/18/2013  Patient:  [name], [name]   Account Number:  [id number]  Date Initiated:  12/18/2013  Documentation initiated by:  Olga Coaster  Subjective/Objective Assessment:   ADMITTED WITH ACUTE ENCEPHALOPATHY     Action/Plan:   PATIENT RESIDES IN NURSING FACILITY; [facility] WORKER FOLLOWING CASE   Anticipated DC Date:  12/21/2013   Anticipated DC Plan:  SKILLED NURSING FACILITY  In-house referral  Clinical Social Worker      DC Planning Services  CM consult        Status of service:  In process, will continue to follow Medicare Important Message given?   (If response is "NO", the following Medicare IM given date fields will be blank)  Per UR Regulation:  Reviewed for med. necessity/level of care/duration of stay  Comments:  11/20/2015Mindi Slicker RN,BSN,MHA [phone]

## 2013-12-18 NOTE — ED Provider Notes (Signed)
CSN: [id number]     Arrival date & time 12/17/13  2342 History   First MD Initiated Contact with Patient 12/17/13 2345     Chief Complaint  Patient presents with  . Altered Mental Status    Level V caveat:   The history is provided by the patient.   Patient brought to the emergency department for unresponsiveness from the nursing home.  EMS reports hypoglycemia and was given glucagon.  No seizure activity noted.  Patient had been doing well over the past several days as far as nursing staff is noted.  Her mental status is somewhat improved from the time EMS picked her up until now.  She seems to be following commands now.  No reports of vomiting or diarrhea.   Past Medical History  Diagnosis Date  . Hypertension   . Diabetes mellitus   . Blindness   . TIA (transient ischemic attack)   . Anemia   . GERD (gastroesophageal reflux disease)   . Constipation   . Renal disorder 05/2012    acute Renal Failure   Past Surgical History  Procedure Laterality Date  . Gallbladder surgery    . Esophageal dilation    . Cholecystectomy    . Amputation  02/18/2012    Procedure: AMPUTATION RAY;  Surgeon: [name], MD;  Location: [facility];  Service: Orthopedics;  Laterality: Right;  right second toe  . Tee without cardioversion  02/21/2012    Procedure: TRANSESOPHAGEAL ECHOCARDIOGRAM (TEE);  Surgeon: [name], MD;  Location: [facility] ENDOSCOPY;  Service: Cardiovascular;  Laterality: N/A;   spoke with [name] from [place] pt will arrive by 815ish( thy change shifts at [place])  . Tee without cardioversion  02/25/2012    Procedure: TRANSESOPHAGEAL ECHOCARDIOGRAM (TEE);  Surgeon: [name], MD;  Location: [place];  Service: Cardiovascular;  Laterality: N/A;  . Amputation Right 09/07/2013    Procedure: AMPUTATION RAY;  Surgeon: [name], MD;  Location: [place];  Service: Orthopedics;  Laterality: Right;  Right Transmetatarsal Amputation   No family history on file. History  Substance Use  Topics  . Smoking status: Former Smoker -- 0 years    Quit date: 06/25/1999  . Smokeless tobacco: Current User    Types: Snuff  . Alcohol Use: No   OB History    No data available     Review of Systems  Unable to perform ROS: Mental status change      Allergies  Review of patient's allergies indicates no known allergies.  Home Medications   Prior to Admission medications   Medication Sig Start Date End Date Taking? Authorizing Provider  acetaminophen (TYLENOL) 325 MG tablet Take 650 mg by mouth every 6 (six) hours as needed for fever.    Yes Historical Provider, MD  acetaminophen (TYLENOL) 325 MG tablet Take 650 mg by mouth 3 (three) times daily.   Yes Historical Provider, MD  aspirin EC 325 MG tablet Take 325 mg by mouth daily.     Yes Historical Provider, MD  Brinzolamide-Brimonidine [facility]) 1-0.2 % SUSP Place 1 drop into the left eye 2 (two) times daily.   Yes Historical Provider, MD  carvedilol (COREG) 3.125 MG tablet Take 1 tablet (3.125 mg total) by mouth daily with breakfast. 06/29/11 09/20/22 Yes [name], MD  carvedilol (COREG) 3.125 MG tablet Take 3.125 mg by mouth 2 (two) times daily with a meal.   Yes Historical Provider, MD  docusate sodium (COLACE) 100 MG capsule Take 100  mg by mouth 2 (two) times daily.   Yes Historical Provider, MD  DULoxetine (CYMBALTA) 60 MG capsule Take 1 capsule (60 mg total) by mouth daily. When completes 2 wks at 30mg  09/19/13  Yes [name], DO  furosemide (LASIX) 40 MG tablet Take 40 mg by mouth daily.   Yes Historical Provider, MD  gabapentin (NEURONTIN) 300 MG capsule Take 300 mg by mouth 2 (two) times daily.   Yes Historical Provider, MD  HYDROcodone-acetaminophen (NORCO/VICODIN) 5-325 MG per tablet Take one tablet by mouth every 6 hours scheduled for pain 10/26/13  Yes [name], DO  hydrocortisone (ANUSOL-HC) 2.5 % rectal cream Place 1 application rectally 2 (two) times daily as needed for hemorrhoids.    Yes Historical  Provider, MD  insulin detemir (LEVEMIR) 100 UNIT/ML injection Inject 12 Units into the skin at bedtime.   Yes Historical Provider, MD  insulin lispro (HUMALOG KWIKPEN) 100 UNIT/ML KiwkPen Inject 5 Units into the skin 3 (three) times daily before meals.   Yes Historical Provider, MD  insulin lispro (HUMALOG) 100 UNIT/ML injection Inject 5 Units into the skin 3 (three) times daily before meals. As needed if cbg still over 150   Yes Historical Provider, MD  ipratropium-albuterol (DUONEB) 0.5-2.5 (3) MG/3ML SOLN Take 3 mLs by nebulization every 6 (six) hours as needed (wheezing).   Yes Historical Provider, MD  metoCLOPramide (REGLAN) 5 MG tablet Take 5 mg by mouth 3 (three) times daily.   Yes Historical Provider, MD  Multiple Vitamins-Minerals (MULTIVITAMIN WITH MINERALS) tablet Take 1 tablet by mouth daily.   Yes Historical Provider, MD  oxyCODONE-acetaminophen (PERCOCET/ROXICET) 5-325 MG per tablet Take one tablet by mouth every 4 hours as needed for pain 10/21/13  Yes Estill Dooms, MD  polyethylene glycol (MIRALAX / GLYCOLAX) packet Take 17 g by mouth daily. 06/18/12  Yes [name], MD  promethazine (PHENERGAN) 25 MG tablet Take 25 mg by mouth every 6 (six) hours as needed for nausea or vomiting.   Yes Historical Provider, MD  promethazine (PHENERGAN) 25 MG/ML injection Inject 25 mg into the vein every 6 (six) hours as needed for nausea or vomiting.   Yes Historical Provider, MD  protein supplement (RESOURCE BENEPROTEIN) POWD Take 1 scoop by mouth 3 (three) times daily with meals.   Yes Historical Provider, MD  simvastatin (ZOCOR) 20 MG tablet Take 20 mg by mouth daily.   Yes Historical Provider, MD  sodium chloride (OCEAN) 0.65 % SOLN nasal spray Place 1 spray into the nose 4 (four) times daily as needed for congestion. 06/29/11  Yes [name], MD  sodium chloride 1 G tablet Take 2 g by mouth 3 (three) times daily.   Yes Historical Provider, MD  timolol (TIMOPTIC) 0.5 % ophthalmic solution  Place 1 drop into the left eye 2 (two) times daily.   Yes Historical Provider, MD  DULoxetine (CYMBALTA) 30 MG capsule Take 1 capsule (30 mg total) by mouth daily. For depression and neuropathic pain 09/15/13 09/29/13  [name], DO  gabapentin (NEURONTIN) 100 MG capsule Take 300 mg by mouth 2 (two) times daily. Phantom pain    Historical Provider, MD   BP 132/102 mmHg  Pulse 81  Temp(Src) 96.5 F (35.8 C) (Rectal)  Resp 14  Ht 5\' 3"  (1.6 m)  Wt 200 lb (90.719 kg)  BMI 35.44 kg/m2  SpO2 100% Physical Exam  Constitutional: She appears well-developed and well-nourished. No distress.  HENT:  Head: Normocephalic and atraumatic.  Eyes: EOM are normal.  Neck: Normal range of motion.  Cardiovascular: Normal rate, regular rhythm and normal heart sounds.   Pulmonary/Chest: Effort normal and breath sounds normal.  Abdominal: Soft. She exhibits no distension. There is no tenderness.  Musculoskeletal: Normal range of motion.  Neurological:  Opens eyes to voice.  Follows simple commands.  Moves all 4 extremities equally.  Skin: Skin is warm and dry.  Psychiatric: She has a normal mood and affect. Judgment normal.  Nursing note and vitals reviewed.   ED Course  Procedures (including critical care time) Labs Review Labs Reviewed  CBC WITH DIFFERENTIAL - Abnormal; Notable for the following:    WBC 10.6 (*)    Hemoglobin 11.2 (*)    RDW 18.5 (*)    Neutrophils Relative % 88 (*)    Neutro Abs 9.3 (*)    Lymphocytes Relative 8 (*)    All other components within normal limits  COMPREHENSIVE METABOLIC PANEL - Abnormal; Notable for the following:    Glucose, Bld 134 (*)    Albumin 2.3 (*)    AST 49 (*)    Alkaline Phosphatase 217 (*)    GFR calc non Af Amer 84 (*)    All other components within normal limits  URINALYSIS, ROUTINE W REFLEX MICROSCOPIC - Abnormal; Notable for the following:    Color, Urine AMBER (*)    APPearance TURBID (*)    Hgb urine dipstick LARGE (*)    Protein, ur  30 (*)    Leukocytes, UA LARGE (*)    All other components within normal limits  URINE MICROSCOPIC-ADD ON - Abnormal; Notable for the following:    Squamous Epithelial / LPF FEW (*)    Bacteria, UA FEW (*)    All other components within normal limits  CBG MONITORING, ED - Abnormal; Notable for the following:    Glucose-Capillary 109 (*)    All other components within normal limits  I-STAT ARTERIAL BLOOD GAS, ED - Abnormal; Notable for the following:    pH, Arterial 7.330 (*)    pCO2 arterial 58.5 (*)    pO2, Arterial 55.0 (*)    Bicarbonate 30.8 (*)    Acid-Base Excess 3.0 (*)    All other components within normal limits  CBG MONITORING, ED - Abnormal; Notable for the following:    Glucose-Capillary 149 (*)    All other components within normal limits  URINE CULTURE  CULTURE, BLOOD (ROUTINE X 2)  CULTURE, BLOOD (ROUTINE X 2)  CLOSTRIDIUM DIFFICILE BY PCR  TROPONIN I  I-STAT CG4 LACTIC ACID, ED    Imaging Review Ct Head Wo Contrast  12/18/2013   CLINICAL DATA:  Altered mental status. Nursing home patient, hypoglycemia and unresponsive.  EXAM: CT HEAD WITHOUT CONTRAST  TECHNIQUE: Contiguous axial images were obtained from the base of the skull through the vertex without intravenous contrast.  COMPARISON:  CT of the head February 14, 2012  FINDINGS: Severe hydrocephalus, with ballooning of the temporal horns, unchanged. Anterior recess of the third ventricle is 12 mm, unchanged. Similar periventricular white matter hypodensity. Patchy bilateral thalamus hypodensity is unchanged without mass effect, midline shift. High RIGHT frontal transcortical encephalomalacia. No acute large vascular territory infarct. No hemorrhage.  No abnormal extra-axial fluid collections. Basal cisterns are patent. Severe calcific atherosclerosis of the carotid bulbs.  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 No paranasal sinus air-fluid levels. LEFT middle ear and mastoid effusion, trace RIGHT  mastoid effusion.  IMPRESSION: No acute intracranial process.  Stable  severe hydrocephalus. Similar periventricular white matter hypodensities may reflect chronic small vessel ischemic disease and/or transependymal flow cerebral spinal fluid. RIGHT frontal encephalomalacia is likely posttraumatic. Nonspecific stable bilateral thalamic hypodensities.  LEFT middle ear and LEFT greater than RIGHT mastoid effusions.   Electronically Signed   By: Elon Alas   On: 12/18/2013 02:22     EKG Interpretation None      MDM   Final diagnoses:  Altered mental status  Hypothermia, initial encounter  Acute cystitis without hematuria    Oral status continued to improve while in the emergency department.  Patient was found to be hypothermic.  She has urinary tract infection.  Georgiann Mccoy given early in her care.  Labs and head CT otherwise without significant abnormalities.  Admit to the hospital.    Hoy Morn, MD 12/18/13 [phone]

## 2013-12-18 NOTE — Clinical Social Work Psychosocial (Addendum)
Clinical Social Work Department BRIEF PSYCHOSOCIAL ASSESSMENT 12/18/2013  Patient:  [name], [name]     Account Number:  [id number]     Admit date:  12/17/2013  Clinical Social Worker:  Glendon Axe, CLINICAL SOCIAL WORKER  Date/Time:  12/18/2013 02:27 PM  Referred by:  Physician  Date Referred:  12/18/2013 Referred for  SNF Placement   Other Referral:   Interview type:  Other - See comment Other interview type:   CSW spoke with pt's legal guardian, Janece Canterbury and pt's daughter Laurel Dimmer via telephone.    PSYCHOSOCIAL DATA Living Status:  FACILITY Admitted from facility:  [place], [place] Level of care:  [place] Primary support name:  Janece Canterbury (Legal Guardian) Primary support relationship to patient:  NONE Degree of support available:   Okay    CURRENT CONCERNS Current Concerns  Post-Acute Placement   Other Concerns:    SOCIAL WORK ASSESSMENT / PLAN Clinical Social Worker contacted pt's Legal Guardian, Janece Canterbury in reference to pt's return to [facility]. Pt's legal guardian reported she was not aware pt was in the hospital and asked many questions in regards to pt's condition. CSW provided DSS case worker with unit contact information and advised her to contact pt's nurse for further medical information. CSW contacted admissions at  [facility] which has confirmed pt's return once medically stable. Fl-2 signed. CSW to follow pt for disposition needs.   Assessment/plan status:  Psychosocial Support/Ongoing Assessment of Needs Other assessment/ plan:   Information/referral to community resources:  n/a  PATIENT'S/FAMILY'S RESPONSE TO PLAN OF CARE: Pt lying in bed, disoriented. Pt's guardian and GL [place] agreeable to pt's return once medically stable.     Glendon Axe, MSW, LCSWA [phone] 12/18/2013 2:39 PM

## 2013-12-18 NOTE — Consult Note (Addendum)
WOC wound consult note Reason for Consult: right foot wound.  Pt with history of amputation per Dr. Eulas Post in Jan and then second operation per Dr. Lorin Mercy in Aug. 2015.  Now with transmet surgical incision that has lateral non healing open area.  No significant s/s of infection, no edema, weak palpable pulses right foot. Wound type: surgical, non healing R/T DM and PAD Measurement: 2.5cm x 2.5cm x 0.5cm  Wound bed: mostly pink, some slough noted at the wound edges and aprox. 40% eschar at wound edges.  Drainage (amount, consistency, odor) minimal, serosanguinous  Periwound: intact  Dressing procedure/placement/frequency: enzymatic debridement agent to clean away necrotic tissue, and packing for moist wound healing. Change daily.  Will need follow up for wound care at DC   Conservative sharp wound debridement (CSWD performed at the bedside): prepped area with betadine and trimmed away non viable tissue at bedside.  Dr. Sloan Leiter at bedside, requested plain film of the right foot since this patient has had open wound for some time.  Discussed POC with patient and bedside nurse.  Re consult if needed, will not follow at this time. Thanks  Siobhan Zaro Kellogg, [place] [phone])

## 2013-12-18 NOTE — Plan of Care (Signed)
Problem: Acute Rehab PT Goals(only PT should resolve) Goal: Patient Will Transfer Sit To/From Stand Verbal and tactile due to vision loss Goal: Pt Will Transfer Bed To Chair/Chair To Bed Using HHA due to vision loss

## 2013-12-18 NOTE — H&P (Signed)
[facility] History and Physical  [name] QPY:[id number] DOB: 1940/11/04 DOA: 12/17/2013  Referring physician: ED physician PCP: Hollace Kinnier, DO  Specialists:   Chief Complaint: Altered mental status  HPI: [name] is a 73 y.o. female with a past medical history of diabetes mellitus, bilateral blindness, chronic kidney disease-stage III, hypertension, peripheral vascular disease, history of a TIA, recent status post right transmetatarsal amputation, who presents with altered mental status.  [facility] Patient had been doing well over the past several days as far as nursing staff is noted. She was found to be unresponsiveness by staff in the facility. Per EMS, glucose was checked with reading of 63.  She was given glucagon and rechecked glucose was 60. she was found to have o2 sat 85% and placed on non rebreather. Patient's CBG en route was 90. No seizure activity was noted. patient was found to be hypothermic. Bair Hugger was started in Ed. Her mental status improved gradually. When I evaluated the patient in ED, she is completely oriented 3. She is mentally clear to me.   She reports that since yesterday she started having nausea, no vomiting. She denies diarrhea, however nurse noticed she had a large volume of loose stool in the ED. She does not have fever, chills, cough, shortness breath, chest pain. She has dwelling catheter, and denies any symptoms of UTI. Of note, she had right transmetatarsal amputation recently. Her right foot is wrapped, with mild tenderness.   Work up in the ED demonstrates positive urinalysis for UTI; lactate 0.86; troponin negative; WBC 10.6; body temperature 96.5. CT-head showed no acute abnormalities, but with stable severe hydrocephalus and ear mastoid effusions  Review of Systems: As presented in the history of presenting illness, rest negative.  Where does patient live?  [facility] Can patient  participate in ADLs? none  Allergy: No Known Allergies  Past Medical History  Diagnosis Date  . Hypertension   . Diabetes mellitus   . Blindness   . TIA (transient ischemic attack)   . Anemia   . GERD (gastroesophageal reflux disease)   . Constipation   . Renal disorder 05/2012    acute Renal Failure    Past Surgical History  Procedure Laterality Date  . Gallbladder surgery    . Esophageal dilation    . Cholecystectomy    . Amputation  02/18/2012    Procedure: AMPUTATION RAY;  Surgeon: [name], MD;  Location: [facility];  Service: Orthopedics;  Laterality: Right;  right second toe  . Tee without cardioversion  02/21/2012    Procedure: TRANSESOPHAGEAL ECHOCARDIOGRAM (TEE);  Surgeon: [name], MD;  Location: [facility];  Service: Cardiovascular;  Laterality: N/A;   spoke with [name] from [place] pt will arrive by 815ish( thy change shifts at [facility])  . Tee without cardioversion  02/25/2012    Procedure: TRANSESOPHAGEAL ECHOCARDIOGRAM (TEE);  Surgeon: [name], MD;  Location: [facility];  Service: Cardiovascular;  Laterality: N/A;  . Amputation Right 09/07/2013    Procedure: AMPUTATION RAY;  Surgeon: [name], MD;  Location: [facility];  Service: Orthopedics;  Laterality: Right;  Right Transmetatarsal Amputation    Social History:  reports that she quit smoking about 14 years ago. Her smokeless tobacco use includes Snuff. She reports that she does not drink alcohol or use illicit drugs.  Family History: No family history on file.   Prior to Admission medications   Medication Sig Start Date End Date Taking? Authorizing  Provider  acetaminophen (TYLENOL) 325 MG tablet Take 650 mg by mouth every 6 (six) hours as needed for fever.    Yes Historical Provider, MD  acetaminophen (TYLENOL) 325 MG tablet Take 650 mg by mouth 3 (three) times daily.   Yes Historical Provider, MD  aspirin EC 325 MG tablet Take 325 mg by mouth daily.     Yes Historical Provider, MD   Brinzolamide-Brimonidine [facility]) 1-0.2 % SUSP Place 1 drop into the left eye 2 (two) times daily.   Yes Historical Provider, MD  carvedilol (COREG) 3.125 MG tablet Take 1 tablet (3.125 mg total) by mouth daily with breakfast. 06/29/11 09/20/22 Yes [name], MD  carvedilol (COREG) 3.125 MG tablet Take 3.125 mg by mouth 2 (two) times daily with a meal.   Yes Historical Provider, MD  docusate sodium (COLACE) 100 MG capsule Take 100 mg by mouth 2 (two) times daily.   Yes Historical Provider, MD  DULoxetine (CYMBALTA) 60 MG capsule Take 1 capsule (60 mg total) by mouth daily. When completes 2 wks at 30mg  09/19/13  Yes [name], DO  furosemide (LASIX) 40 MG tablet Take 40 mg by mouth daily.   Yes Historical Provider, MD  gabapentin (NEURONTIN) 300 MG capsule Take 300 mg by mouth 2 (two) times daily.   Yes Historical Provider, MD  HYDROcodone-acetaminophen (NORCO/VICODIN) 5-325 MG per tablet Take one tablet by mouth every 6 hours scheduled for pain 10/26/13  Yes [name], DO  hydrocortisone (ANUSOL-HC) 2.5 % rectal cream Place 1 application rectally 2 (two) times daily as needed for hemorrhoids.    Yes Historical Provider, MD  insulin detemir (LEVEMIR) 100 UNIT/ML injection Inject 12 Units into the skin at bedtime.   Yes Historical Provider, MD  insulin lispro (HUMALOG KWIKPEN) 100 UNIT/ML KiwkPen Inject 5 Units into the skin 3 (three) times daily before meals.   Yes Historical Provider, MD  insulin lispro (HUMALOG) 100 UNIT/ML injection Inject 5 Units into the skin 3 (three) times daily before meals. As needed if cbg still over 150   Yes Historical Provider, MD  ipratropium-albuterol (DUONEB) 0.5-2.5 (3) MG/3ML SOLN Take 3 mLs by nebulization every 6 (six) hours as needed (wheezing).   Yes Historical Provider, MD  metoCLOPramide (REGLAN) 5 MG tablet Take 5 mg by mouth 3 (three) times daily.   Yes Historical Provider, MD  Multiple Vitamins-Minerals (MULTIVITAMIN WITH MINERALS) tablet Take  1 tablet by mouth daily.   Yes Historical Provider, MD  oxyCODONE-acetaminophen (PERCOCET/ROXICET) 5-325 MG per tablet Take one tablet by mouth every 4 hours as needed for pain 10/21/13  Yes Estill Dooms, MD  polyethylene glycol (MIRALAX / GLYCOLAX) packet Take 17 g by mouth daily. 06/18/12  Yes [name], MD  promethazine (PHENERGAN) 25 MG tablet Take 25 mg by mouth every 6 (six) hours as needed for nausea or vomiting.   Yes Historical Provider, MD  promethazine (PHENERGAN) 25 MG/ML injection Inject 25 mg into the vein every 6 (six) hours as needed for nausea or vomiting.   Yes Historical Provider, MD  protein supplement (RESOURCE BENEPROTEIN) POWD Take 1 scoop by mouth 3 (three) times daily with meals.   Yes Historical Provider, MD  simvastatin (ZOCOR) 20 MG tablet Take 20 mg by mouth daily.   Yes Historical Provider, MD  sodium chloride (OCEAN) 0.65 % SOLN nasal spray Place 1 spray into the nose 4 (four) times daily as needed for congestion. 06/29/11  Yes Bynum Bellows, MD  sodium chloride  1 G tablet Take 2 g by mouth 3 (three) times daily.   Yes Historical Provider, MD  timolol (TIMOPTIC) 0.5 % ophthalmic solution Place 1 drop into the left eye 2 (two) times daily.   Yes Historical Provider, MD  DULoxetine (CYMBALTA) 30 MG capsule Take 1 capsule (30 mg total) by mouth daily. For depression and neuropathic pain 09/15/13 09/29/13  [name], DO  gabapentin (NEURONTIN) 100 MG capsule Take 300 mg by mouth 2 (two) times daily. Phantom pain    Historical Provider, MD    Physical Exam: Filed Vitals:   12/18/13 0330 12/18/13 0345 12/18/13 0415 12/18/13 0426  BP: 98/54 119/55 141/61 141/61  Pulse: 74 73 72 75  Temp:    96.5 F (35.8 C)  TempSrc:    Rectal  Resp: 12 13 12 14   Height:      Weight:      SpO2:  100% 100% 100%   General: Not in acute distress HEENT:       Eyes: Bilateral blindness       ENT:  no pharynx injection, no tonsillar enlargement.        Neck: No JVD, no bruit, no  mass felt. Cardiac: S1/S2, RRR, No murmurs, No gallops or rubs Pulm: Good air movement bilaterally. Clear to auscultation bilaterally. No rales, wheezing, rhonchi or rubs. Abd: Soft, nondistended, nontender, no rebound pain, no organomegaly, BS present Ext: No edema bilaterally. 2+DP/PT pulse bilaterally. Right foot partial amputation and is wrapped without draining. Musculoskeletal: No joint deformities, erythema, or stiffness, ROM full Skin: No rashes.  Neuro: Alert and oriented X3, cranial nerves II-XII grossly intact except for blindness bilaterally.  muscle strength 5/5 in all extremeties, sensation to light touch intact. Brachial reflex 2+ bilaterally. Knee reflex 1+ bilaterally. Negative Babinski's sign on the left.  Psych: Patient is not psychotic, no suicidal or hemocidal ideation.  Labs on Admission:  Basic Metabolic Panel:  Recent Labs Lab 12/18/13 0013  NA 137  K 3.8  CL 98  CO2 27  GLUCOSE 134*  BUN 15  CREATININE 0.73  CALCIUM 8.7   Liver Function Tests:  Recent Labs Lab 12/18/13 0013  AST 49*  ALT 17  ALKPHOS 217*  BILITOT 0.4  PROT 6.8  ALBUMIN 2.3*   No results for input(s): LIPASE, AMYLASE in the last 168 hours. No results for input(s): AMMONIA in the last 168 hours. CBC:  Recent Labs Lab 12/18/13 0013  WBC 10.6*  NEUTROABS 9.3*  HGB 11.2*  HCT 36.1  MCV 87.0  PLT 269   Cardiac Enzymes:  Recent Labs Lab 12/18/13 0013  TROPONINI <0.30    BNP (last 3 results) No results for input(s): PROBNP in the last 8760 hours. CBG:  Recent Labs Lab 12/17/13 2349 12/18/13 0214  GLUCAP 109* 149*    Radiological Exams on Admission: Ct Head Wo Contrast  12/18/2013   CLINICAL DATA:  Altered mental status. Nursing home patient, hypoglycemia and unresponsive.  EXAM: CT HEAD WITHOUT CONTRAST  TECHNIQUE: Contiguous axial images were obtained from the base of the skull through the vertex without intravenous contrast.  COMPARISON:  CT of the head  February 14, 2012  FINDINGS: Severe hydrocephalus, with ballooning of the temporal horns, unchanged. Anterior recess of the third ventricle is 12 mm, unchanged. Similar periventricular white matter hypodensity. Patchy bilateral thalamus hypodensity is unchanged without mass effect, midline shift. High RIGHT frontal transcortical encephalomalacia. No acute large vascular territory infarct. No hemorrhage.  No abnormal extra-axial fluid collections. Basal  cisterns are patent. Severe calcific atherosclerosis of the carotid bulbs.  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 No paranasal sinus air-fluid levels. LEFT middle ear and mastoid effusion, trace RIGHT mastoid effusion.  IMPRESSION: No acute intracranial process.  Stable severe hydrocephalus. Similar periventricular white matter hypodensities may reflect chronic small vessel ischemic disease and/or transependymal flow cerebral spinal fluid. RIGHT frontal encephalomalacia is likely posttraumatic. Nonspecific stable bilateral thalamic hypodensities.  LEFT middle ear and LEFT greater than RIGHT mastoid effusions.   Electronically Signed   By: Elon Alas   On: 12/18/2013 02:22    EKG: Independently reviewed.   Assessment/Plan Principal Problem:   Acute encephalopathy Active Problems:   Type 2 diabetes, controlled, with peripheral circulatory disorder   Essential hypertension   Osteoarthritis of both knees   Anemia   Hyperlipidemia with target LDL less than 100   Chronic renal failure, stage 3 (moderate)   Status post transmetatarsal amputation of right foot   PAD (peripheral artery disease)   Blindness   TIA (transient ischemic attack)   Hypothermia   UTI (lower urinary tract infection)  Acute encephalopathy: Likely due to UTI and hypoglycemia. This has resolved when I evaluated the patient in emergency room. She is mentally clear to me. - Admitted to MedSurg bed - [place] closely with the frequent neuro check -Treat  underlying problems - hold promethazine  UTI: Urinalysis is positive for UTI. Currently patient's hemodynamically stable. -Started Rocephin IV -Follow-up urine culture  Diarrhea: Though patient denies diarrhea and abdominal pain, nurse noticed patient does have diarrhea in ED -will check c diff -zofran for nausea -Hold constipation medications -IV fluids: Normal saline 75 mL per hour  Hypoglycemia: Likely due to decreased oral intake secondary to nausea and diarrhea, and still continued insulin injection. - will hold Levemir - SSI - cbg q1h for 4 hours, treat hypoglycemia with the 50% dextrose prn - Check TSH to rule out hypothyroidism  Diabetes mellitus: Last A1c was 7.1 on 02/14/12. Now patient has hypoglycemia - will hold Levemir - SSI - Check A1c  Hypertension: -Continue home medications: Lasix, Coreg,  CDK-III: cre=0.73 on admission - Follow renal function by BMP  Hyperlipidemia: -Continue Zocor  Hypothermia: Likely secondary to altered mental status. - Bair Hugger was started  DVT ppx: SQ Heparin    Code Status: Full code Family Communication: None at bed side.    Disposition Plan: Admit to inpatient   Date of Service 12/18/2013    Ivor Costa [facility] Pager [phone]  If 7PM-7AM, please contact night-coverage [email or web address] Password [email or web address] 12/18/2013, 4:50 AM

## 2013-12-18 NOTE — Progress Notes (Signed)
PROGRESS NOTE  [name] PIR:[id number] DOB: September 16, 1940 DOA: 12/17/2013 PCP: Hollace Kinnier, DO  HPI/Subjective: 73 yo female with PMH of diabetes mellitus, b/l blindness, CKD- stage III, hypertension, peripheral vascular disease, hx of TIA, and s/p recent right transmetatarsal amputation who presents with altered mental status.  Pt is a resident of [facility] facility.  Staff report she has been doing well over the past several days but was found unresponsive by staff.  Blood glucose when checked by EMS was 63.  Pt was given glucagon and blood glucose was rechecked and found to be 60.  O2 sat was 85% and she was placed on a non rebreather.  In route CBG was 90.  No seizure activity noted.  Pt was found to be hypothermic- bear hugger started in ED.  Mental status improved gradually.  Admitting physician found pt to be AAOx3.  Pt reports nausea starting 11/19 but no vomiting.  Denies diarrhea but nurses noticed large volme of loose stool in ED.  No fever, chills, cough, shortness of breath, chest pain.  She has indwelling catheter but denies symptoms of UTI.  Work up in the ED demonstrates +UA for UTI; lactate 0.86; negative troponin; WBC 10.6; body temperatuve 96.5.  Head CT showed no acute abnormalities.        Pt is awake and alert.  Mental status has improved.  Complains of pain in right foot.  Tolerating diet.    Assessment/Plan: Acute encephalopathy: Likely due to UTI and hypoglycemia.  Improved.  Alert and responsive.  Able to answer questions appropriately.  Head CT shows no acute abnormalities.  CBG 145.    UTI: Likely from pt's chronic indwelling indwelling catheter.  Catheter changed overnight.  VSSAF.  Temperature improved 98.3.  WBC 10.6.  UA shows infection.  Hx of ESBL.  Continue IV Primaxin.  Urine and blood cultures pending.       Diarrhea: Resolved. C diff negative.        Hypothermia: Resolved. Secondary to hypoglycemia. Last temp 98.3.    Hypoglycemia: Resolved.  Last  CBG 145.  Will hold insulin until able to resume.               Type 2 diabetes, controlled, with peripheral circulatory disorder: Chronic problem.  Last CBG 145.  Will resume insulin when able due to previous episode of hypoglycemia.  A1C pending.    Essential hypertension: Chronic problem.  Last BP 148/60.  Will continue Carvedilol and Furosemide.      Anemia: Hemoglobin mildly decreased at 11.2 but this seems to be around pt's baseline.   Hyperlipidemia with target LDL less than 100: Chronic problem.  Continue Simvastatin.    Chronic renal failure, stage 3 (moderate): Chronic problem  Cr 0.73.  Will monitor BMP.     Status post transmetatarsal amputation of right foot: Parially open wound.  Wound nurse debriding and changing dressings.  Will obtain xray of foot to rule out osteomyelitis.    PAD (peripheral artery disease): Chronic problem.    Osteoarthritis of both knees: Chronic problem.    Blindness: Reports this is a complication of diabetes mellitus.  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    TIA (transient ischemic attack): Hx of TIA.  Continue Simvastatin.      DVT Prophylaxis:  Heparin 5,000 units q8h  Code Status: Full Family Communication: Pt is awake and alert. Disposition Plan: Will return to SNF when medically appropriate.    Consultants:  Wound Care  Procedures:  None  Antibiotics:  IV Primaxin 11/20>>  Objective: Filed Vitals:   12/18/13 0445 12/18/13 0500 12/18/13 0612 12/18/13 0620  BP: 123/44 132/102 152/64   Pulse: 75 81 88   Temp:   98.2 F (36.8 C) 98.3 F (36.8 C)  TempSrc:   Axillary Rectal  Resp: 13 14 16    Height:   5\' 2"  (1.575 m)   Weight:   89.495 kg (197 lb 4.8 oz)   SpO2:  100% 99%     Intake/Output Summary (Last 24 hours) at 12/18/13 1039 Last data filed at 12/18/13 0315  Gross per 24 hour  Intake     50 ml  Output     14 ml  Net     36 ml   Filed Weights   12/18/13 0001 12/18/13 0612  Weight: 90.719 kg (200 lb) 89.495 kg (197 lb  4.8 oz)    Exam: General: NAD, appears stated age, appears to be in poor health  HEENT:  Anicteic Sclera, clouding of lenses b/l, MMM.  Cardiovascular: RRR, S1 S2 auscultated, no rubs, murmurs or gallops.   Respiratory: Clear to auscultation bilaterally with equal chest rise  Abdomen: Soft, nontender, nondistended, + bowel sounds  Extremities: warm dry without cyanosis clubbing or edema. Right transmetatarsal amputation. Neuro: AAOx3 Skin: Without rashes exudates or nodules.   Psych: Normal affect and demeanor with intact judgement and insight   Data Reviewed: Basic Metabolic Panel:  Recent Labs Lab 12/18/13 0013  NA 137  K 3.8  CL 98  CO2 27  GLUCOSE 134*  BUN 15  CREATININE 0.73  CALCIUM 8.7   Liver Function Tests:  Recent Labs Lab 12/18/13 0013  AST 49*  ALT 17  ALKPHOS 217*  BILITOT 0.4  PROT 6.8  ALBUMIN 2.3*   CBC:  Recent Labs Lab 12/18/13 0013  WBC 10.6*  NEUTROABS 9.3*  HGB 11.2*  HCT 36.1  MCV 87.0  PLT 269   Cardiac Enzymes:  Recent Labs Lab 12/18/13 0013  TROPONINI <0.30   CBG:  Recent Labs Lab 12/17/13 2349 12/18/13 0214 12/18/13 0650  GLUCAP 109* 149* 145*    Studies: Ct Head Wo Contrast  12/18/2013   CLINICAL DATA:  Altered mental status. Nursing home patient, hypoglycemia and unresponsive.  EXAM: CT HEAD WITHOUT CONTRAST  TECHNIQUE: Contiguous axial images were obtained from the base of the skull through the vertex without intravenous contrast.  COMPARISON:  CT of the head February 14, 2012  FINDINGS: Severe hydrocephalus, with ballooning of the temporal horns, unchanged. Anterior recess of the third ventricle is 12 mm, unchanged. Similar periventricular white matter hypodensity. Patchy bilateral thalamus hypodensity is unchanged without mass effect, midline shift. High RIGHT frontal transcortical encephalomalacia. No acute large vascular territory infarct. No hemorrhage.  No abnormal extra-axial fluid collections. Basal  cisterns are patent. Severe calcific atherosclerosis of the carotid bulbs.  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 No paranasal sinus air-fluid levels. LEFT middle ear and mastoid effusion, trace RIGHT mastoid effusion.  IMPRESSION: No acute intracranial process.  Stable severe hydrocephalus. Similar periventricular white matter hypodensities may reflect chronic small vessel ischemic disease and/or transependymal flow cerebral spinal fluid. RIGHT frontal encephalomalacia is likely posttraumatic. Nonspecific stable bilateral thalamic hypodensities.  LEFT middle ear and LEFT greater than RIGHT mastoid effusions.   Electronically Signed   By: Elon Alas   On: 12/18/2013 02:22    Scheduled Meds: . aspirin EC  325 mg Oral Daily  . brinzolamide  1 drop Left Eye BID  And  . brimonidine  1 drop Left Eye BID  . carvedilol  3.125 mg Oral BID WC  . collagenase   Topical Daily  . DULoxetine  60 mg Oral Daily  . furosemide  40 mg Oral Daily  . gabapentin  300 mg Oral BID  . heparin  5,000 Units Subcutaneous 3 times per day  . imipenem-cilastatin  500 mg Intravenous 3 times per day  . metoCLOPramide  5 mg Oral TID  . multivitamin with minerals  1 tablet Oral Daily  . protein supplement  1 scoop Oral TID WC  . simvastatin  20 mg Oral Daily  . sodium chloride  2 g Oral TID  . timolol  1 drop Left Eye BID   Continuous Infusions: . sodium chloride      Principal Problem:   Acute encephalopathy Active Problems:   Type 2 diabetes, controlled, with peripheral circulatory disorder   Essential hypertension   Osteoarthritis of both knees   Anemia   Hyperlipidemia with target LDL less than 100   Chronic renal failure, stage 3 (moderate)   Status post transmetatarsal amputation of right foot   PAD (peripheral artery disease)   Blindness   TIA (transient ischemic attack)   Hypothermia   UTI (lower urinary tract infection)   Diarrhea  Adelene Idler PA-S  [facility] Pager [phone]. If 7PM-7AM, please contact night-coverage at [email or web address], password [email or web address] 12/18/2013, 10:39 AM  LOS: 1 day    Attending Patient was seen, examined,treatment plan was discussed with the  Advance Practice Provider.  I have directly reviewed the clinical findings, lab, imaging studies and management of this patient in detail. I have made the necessary changes to the above noted documentation, and agree with the documentation, as recorded by the Advance Practice Provider.   73 year old female resident of a skilled nursing facility-chronically bedbound/blind-brought in for hypoglycemia/UTI from a chronic indwelling [place]. Await cultures, supportive care for now. Continue antibiotics.  Nena Alexander MD [facility].

## 2013-12-18 NOTE — ED Notes (Signed)
cbg is 109

## 2013-12-19 ENCOUNTER — Encounter (HOSPITAL_COMMUNITY): Payer: Self-pay | Admitting: *Deleted

## 2013-12-19 DIAGNOSIS — R197 Diarrhea, unspecified: Secondary | ICD-10-CM

## 2013-12-19 DIAGNOSIS — E162 Hypoglycemia, unspecified: Secondary | ICD-10-CM

## 2013-12-19 LAB — COMPREHENSIVE METABOLIC PANEL
ALBUMIN: 2.3 g/dL — AB (ref 3.5–5.2)
ALT: 13 U/L (ref 0–35)
AST: 23 U/L (ref 0–37)
Alkaline Phosphatase: 183 U/L — ABNORMAL HIGH (ref 39–117)
Anion gap: 13 (ref 5–15)
BUN: 16 mg/dL (ref 6–23)
CO2: 25 meq/L (ref 19–32)
CREATININE: 0.92 mg/dL (ref 0.50–1.10)
Calcium: 8.4 mg/dL (ref 8.4–10.5)
Chloride: 101 mEq/L (ref 96–112)
GFR calc Af Amer: 71 mL/min — ABNORMAL LOW (ref >90)
GFR, EST NON AFRICAN AMERICAN: 61 mL/min — AB (ref >90)
Glucose, Bld: 224 mg/dL — ABNORMAL HIGH (ref 70–99)
Potassium: 4.2 mEq/L (ref 3.7–5.3)
SODIUM: 139 meq/L (ref 137–147)
Total Bilirubin: 0.2 mg/dL — ABNORMAL LOW (ref 0.3–1.2)
Total Protein: 6.7 g/dL (ref 6.0–8.3)

## 2013-12-19 LAB — GLUCOSE, CAPILLARY
Glucose-Capillary: 221 mg/dL — ABNORMAL HIGH (ref 70–99)
Glucose-Capillary: 226 mg/dL — ABNORMAL HIGH (ref 70–99)
Glucose-Capillary: 210 mg/dL — ABNORMAL HIGH (ref 70–99)

## 2013-12-19 LAB — CBC
HEMATOCRIT: 35.2 % — AB (ref 36.0–46.0)
Hemoglobin: 10.9 g/dL — ABNORMAL LOW (ref 12.0–15.0)
MCH: 27 pg (ref 26.0–34.0)
MCHC: 31 g/dL (ref 30.0–36.0)
MCV: 87.1 fL (ref 78.0–100.0)
PLATELETS: 246 10*3/uL (ref 150–400)
RBC: 4.04 MIL/uL (ref 3.87–5.11)
RDW: 18.6 % — ABNORMAL HIGH (ref 11.5–15.5)
WBC: 8.3 10*3/uL (ref 4.0–10.5)

## 2013-12-19 NOTE — Plan of Care (Signed)
Problem: Phase I Progression Outcomes Goal: Tolerating diet Outcome: Completed/Met Date Met:  12/19/13

## 2013-12-19 NOTE — Plan of Care (Signed)
Problem: Phase I Progression Outcomes Goal: Pain controlled with appropriate interventions Outcome: Completed/Met Date Met:  12/19/13 Goal: OOB as tolerated unless otherwise ordered Outcome: Not Applicable Date Met:  07/57/32 Goal: Vital Signs stable- temperature less than 102 Outcome: Completed/Met Date Met:  12/19/13 Goal: Initial discharge plan identified Outcome: Completed/Met Date Met:  12/19/13 Goal: Voiding-avoid urinary catheter unless indicated Outcome: Not Progressing Pt has chronic foley catheter Goal: Adequate I & O Outcome: Progressing Goal: Tolerating diet Outcome: Progressing Goal: Hemodynamically stable Outcome: Completed/Met Date Met:  12/19/13 Goal: Other Phase I Outcomes/Goals Outcome: Progressing

## 2013-12-19 NOTE — Progress Notes (Signed)
PATIENT DETAILS Name: [name] Age: 73 y.o. Sex: female Date of Birth: 1940/06/19 Admit Date: 12/17/2013 Admitting Physician Ivor Costa, MD LKG:MWNU, TIFFANY, DO  Subjective: Denies any major complaints. Inquiring about discharge.  Assessment/Plan: UTI: Likely from pt's chronic indwelling indwelling catheter. Catheter changed on admit. 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 Await Urine cs.   Diarrhea: Resolved. C diff negative.    Hypothermia: Resolved. Secondary to hypoglycemia. Last temp 98.3.   Hypoglycemia: Resolved. Secondary to poor oral intake from nausea. CBG's stable with any insulin. Await A1C    Type 2 diabetes, controlled, with peripheral circulatory disorder: Chronic problem.A1C pending. CBG's stable without use of any agents. Follow and start SSI accordingly  Essential hypertension: Controlled with  Carvedilol and Furosemide.   Anemia: Mild drop in Hb likley due to IVF, acute illness. Monitor periodically  Hyperlipidemia with target LDL less than 100:  Continue Simvastatin.   Chronic renal failure, stage 3 (moderate): creatinine stable, monitor lytes periodically   Status post transmetatarsal amputation of right foot: Parially open wound. X Ray negative, given hx of open wound for a month, will check MIR to make sure no osteomyelitis.  PAD (peripheral artery disease): Chronic problem.   Osteoarthritis of both knees: Chronic problem.   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   TIA (transient ischemic attack): Hx of TIA. Continue ASA and Simvastatin.   Hx of Recent Urinary Retention: with a foley cather in place since Sept-will attempt voiding trial   DVT Prophylaxis: Heparin 5,000 units q8h   Disposition: Remain inpatient-SNF early next week if improvement continues  Antibiotics:  Primaxin 11/20>>  Rocephin  11/20>>11/20   Anti-infectives    Start     Dose/Rate Route Frequency Ordered Stop   12/18/13 0800  cefTRIAXone (ROCEPHIN) 1 g in dextrose 5 % 50 mL IVPB - Premix  Status:  Discontinued     1 g100 mL/hr over 30 Minutes Intravenous Every 24 hours 12/18/13 2725 12/18/13 0722   12/18/13 0745  imipenem-cilastatin (PRIMAXIN) 500 mg in sodium chloride 0.9 % 100 mL IVPB     500 mg200 mL/hr over 30 Minutes Intravenous 3 times per day 12/18/13 0732     12/18/13 0045  piperacillin-tazobactam (ZOSYN) IVPB 3.375 g     3.375 g100 mL/hr over 30 Minutes Intravenous  Once 12/18/13 0034 12/18/13 0315   12/18/13 0045  vancomycin (VANCOCIN) IVPB 1000 mg/200 mL premix     1,000 mg200 mL/hr over 60 Minutes Intravenous  Once 12/18/13 0036 12/18/13 0421      DVT Prophylaxis: Prophylactic Heparin   Code Status: Full code   Family Communication None at bedside  Procedures:  None  CONSULTS:  None  Time spent 40 minutes-which includes 50% of the time with face-to-face with patient/ family and coordinating care related to the above assessment and plan.  MEDICATIONS: Scheduled Meds: . aspirin EC  325 mg Oral Daily  . brinzolamide  1 drop Left Eye BID   And  . brimonidine  1 drop Left Eye BID  . carvedilol  3.125 mg Oral BID WC  . collagenase   Topical Daily  . DULoxetine  60 mg Oral Daily  . furosemide  40 mg Oral Daily  . gabapentin  300 mg Oral BID  . heparin  5,000 Units Subcutaneous 3 times per day  . imipenem-cilastatin  500 mg Intravenous 3 times per day  . metoCLOPramide  5 mg Oral TID  . multivitamin with minerals  1 tablet Oral Daily  . protein supplement  1 scoop Oral TID WC  . simvastatin  20 mg Oral Daily  . sodium chloride  2 g Oral TID  . timolol  1 drop Left Eye BID   Continuous Infusions: . sodium chloride 75 mL/hr at 12/18/13 2305   PRN Meds:.acetaminophen, dextrose, HYDROcodone-acetaminophen, hydrocortisone, ipratropium-albuterol, ondansetron **OR** ondansetron (ZOFRAN)  IV    PHYSICAL EXAM: Vital signs in last 24 hours: Filed Vitals:   12/19/13 0206 12/19/13 0500 12/19/13 0614 12/19/13 0958  BP: 108/47  144/59 141/59  Pulse: 75  73 70  Temp: 98.7 F (37.1 C)  98.3 F (36.8 C) 98.3 F (36.8 C)  TempSrc: Oral  Axillary Oral  Resp: 18  18 18   Height:      Weight:  202.5 kg (446 lb 6.9 oz)    SpO2: 100%  100% 100%    Weight change: 111.78 kg (246 lb 6.9 oz) Filed Weights   12/18/13 0001 12/18/13 0612 12/19/13 0500  Weight: 90.719 kg (200 lb) 89.495 kg (197 lb 4.8 oz) 202.5 kg (446 lb 6.9 oz)   Body mass index is 81.63 kg/(m^2).   Gen Exam: Awake and alert with clear speech.   Neck: Supple, No JVD.   Chest: B/L Clear.   CVS: S1 S2 Regular, no murmurs.  Abdomen: soft, BS +, non tender, non distended.  Extremities: no edema, lower extremities warm to touch. Neurologic: Non Focal.   Skin: No Rash.    Intake/Output from previous day:  Intake/Output Summary (Last 24 hours) at 12/19/13 1033 Last data filed at 12/19/13 0900  Gross per 24 hour  Intake    120 ml  Output    900 ml  Net   -780 ml     LAB RESULTS: CBC  Recent Labs Lab 12/18/13 0013 12/19/13 0638  WBC 10.6* 8.3  HGB 11.2* 10.9*  HCT 36.1 35.2*  PLT 269 246  MCV 87.0 87.1  MCH 27.0 27.0  MCHC 31.0 31.0  RDW 18.5* 18.6*  LYMPHSABS 0.9  --   MONOABS 0.3  --   EOSABS 0.1  --   BASOSABS 0.0  --     Chemistries   Recent Labs Lab 12/18/13 0013 12/19/13 0638  NA 137 139  K 3.8 4.2  CL 98 101  CO2 27 25  GLUCOSE 134* 224*  BUN 15 16  CREATININE 0.73 0.92  CALCIUM 8.7 8.4    CBG:  Recent Labs Lab 12/17/13 2349 12/18/13 0214 12/18/13 0650 12/18/13 1714  GLUCAP 109* 149* 145* 155*    GFR Estimated Creatinine Clearance: 98.4 mL/min (by C-G formula based on Cr of 0.92).  Coagulation profile  Recent Labs Lab 12/18/13 0810  INR 1.04    Cardiac Enzymes  Recent Labs Lab 12/18/13 0013  TROPONINI <0.30    Invalid input(s): POCBNP No  results for input(s): DDIMER in the last 72 hours. No results for input(s): HGBA1C in the last 72 hours. No results for input(s): CHOL, HDL, LDLCALC, TRIG, CHOLHDL, LDLDIRECT in the last 72 hours.  Recent Labs  12/18/13 0810  TSH 0.858   No results for input(s): VITAMINB12, FOLATE, FERRITIN, TIBC, IRON, RETICCTPCT in the last 72 hours. No results for input(s): LIPASE, AMYLASE in the last 72 hours.  Urine Studies No results for input(s): UHGB, CRYS in the last 72 hours.  Invalid input(s): UACOL, UAPR, USPG, UPH, UTP, UGL, UKET, UBIL, UNIT, UROB, ULEU, UEPI, UWBC,  URBC, UBAC, CAST, UCOM, BILUA  MICROBIOLOGY: Recent Results (from the past 240 hour(s))  Urine culture     Status: None (Preliminary result)   Collection Time: 12/18/13 12:52 AM  Result Value Ref Range Status   Specimen Description URINE, CATHETERIZED  Final   Special Requests NONE  Final   Culture  Setup Time   Final    12/18/2013 08:55 Performed at [facility] PENDING  Incomplete   Culture   Final    Culture reincubated for better growth Performed at [facility]    Report Status PENDING  Incomplete  Clostridium Difficile by PCR     Status: None   Collection Time: 12/18/13  2:31 AM  Result Value Ref Range Status   C difficile by pcr NEGATIVE NEGATIVE Final  MRSA PCR Screening     Status: None   Collection Time: 12/18/13 11:52 AM  Result Value Ref Range Status   MRSA by PCR NEGATIVE NEGATIVE Final    Comment:        The GeneXpert MRSA Assay (FDA approved for NASAL specimens only), is one component of a comprehensive MRSA colonization surveillance program. It is not intended to diagnose MRSA infection nor to guide or monitor treatment for MRSA infections.     RADIOLOGY STUDIES/RESULTS: Ct Head Wo Contrast  12/18/2013   CLINICAL DATA:  Altered mental status. Nursing home patient, hypoglycemia and unresponsive.  EXAM: CT HEAD WITHOUT CONTRAST  TECHNIQUE: Contiguous axial  images were obtained from the base of the skull through the vertex without intravenous contrast.  COMPARISON:  CT of the head February 14, 2012  FINDINGS: Severe hydrocephalus, with ballooning of the temporal horns, unchanged. Anterior recess of the third ventricle is 12 mm, unchanged. Similar periventricular white matter hypodensity. Patchy bilateral thalamus hypodensity is unchanged without mass effect, midline shift. High RIGHT frontal transcortical encephalomalacia. No acute large vascular territory infarct. No hemorrhage.  No abnormal extra-axial fluid collections. Basal cisterns are patent. Severe calcific atherosclerosis of the carotid bulbs.  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 No paranasal sinus air-fluid levels. LEFT middle ear and mastoid effusion, trace RIGHT mastoid effusion.  IMPRESSION: No acute intracranial process.  Stable severe hydrocephalus. Similar periventricular white matter hypodensities may reflect chronic small vessel ischemic disease and/or transependymal flow cerebral spinal fluid. RIGHT frontal encephalomalacia is likely posttraumatic. Nonspecific stable bilateral thalamic hypodensities.  LEFT middle ear and LEFT greater than RIGHT mastoid effusions.   Electronically Signed   By: Elon Alas   On: 12/18/2013 02:22   Dg Foot Complete Right  12/18/2013   CLINICAL DATA:  Wound drainage, RIGHT foot amputation 09/07/2013, history diabetes, hypertension, chronic renal failure  EXAM: RIGHT FOOT COMPLETE - 3+ VIEW  COMPARISON:  RIGHT foot radiographs and MR RIGHT foot 02/17/2012  FINDINGS: Interval transmetatarsal amputation.  Soft tissue swelling at distal foot stump.  Soft tissue gas identified at the lateral aspect of the distal foot, distal to the fifth metatarsal.  No acute fracture or dislocation.  No definite bone destruction.  Scattered small vessel vascular calcification.  IMPRESSION: Interval transmetatarsal amputation.  Soft tissue gas at lateral  aspect of stump distal to the fifth metatarsal bone question related to ulcer or wound breakdown.  No definite bone destruction is seen to suggest osteomyelitis.  However if osteomyelitis remains a clinical concern, recommend MR imaging.   Electronically Signed   By: Lavonia Dana M.D.   On: 12/18/2013 17:07    Oren Binet, MD  [facility] Pager:[phone]  If 7PM-7AM, please contact night-coverage [email or web address] Password [email or web address] 12/19/2013, 10:33 AM   LOS: 2 days

## 2013-12-19 NOTE — Progress Notes (Signed)
OT Cancellation Note  Patient Details Name: [name] MRN: [id number] DOB: [birth year]   Cancelled Treatment:    Reason Eval/Treat Not Completed: OT screened. Pt is Medicare and current D/C plan is SNF (from SNF going back to SNF). No apparent immediate acute care OT needs, therefore will defer OT to SNF. If OT eval is needed please call Acute Rehab Dept. at [phone] or text page OT at [phone].    Benito Mccreedy OTR/L [phone] 12/19/2013, 8:08 AM

## 2013-12-20 ENCOUNTER — Inpatient Hospital Stay (HOSPITAL_COMMUNITY): Payer: Medicare Other

## 2013-12-20 LAB — HEMOGLOBIN A1C
Hgb A1c MFr Bld: 6.2 % — ABNORMAL HIGH (ref <5.7)
Mean Plasma Glucose: 131 mg/dL — ABNORMAL HIGH (ref <117)

## 2013-12-20 LAB — GLUCOSE, CAPILLARY
GLUCOSE-CAPILLARY: 173 mg/dL — AB (ref 70–99)
GLUCOSE-CAPILLARY: 170 mg/dL — AB (ref 70–99)
Glucose-Capillary: 201 mg/dL — ABNORMAL HIGH (ref 70–99)
Glucose-Capillary: 151 mg/dL — ABNORMAL HIGH (ref 70–99)

## 2013-12-20 MED ORDER — INSULIN ASPART 100 UNIT/ML ~~LOC~~ SOLN
0.0000 [IU] | Freq: Three times a day (TID) | SUBCUTANEOUS | Status: DC
  Administered 2013-12-20: 2 [IU] via SUBCUTANEOUS
  Administered 2013-12-20: 3 [IU] via SUBCUTANEOUS
  Administered 2013-12-20 – 2013-12-21 (×3): 2 [IU] via SUBCUTANEOUS
  Administered 2013-12-21: 3 [IU] via SUBCUTANEOUS
  Administered 2013-12-22 (×2): 2 [IU] via SUBCUTANEOUS

## 2013-12-20 NOTE — Progress Notes (Signed)
PATIENT DETAILS Name: [name] Age: 73 y.o. Sex: female Date of Birth: December 30, 1940 Admit Date: 12/17/2013 Admitting Physician Ivor Costa, MD LGX:QJJH, TIFFANY, DO  Subjective: Denies any major complaints.  Assessment/Plan: UTI: Likely from pt's chronic indwelling indwelling catheter. Catheter changed on admit. 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 Urine cs positive for Gram neg rods.  Diarrhea: Resolved. C diff negative.    Hypothermia: Resolved. Secondary to hypoglycemia.   Hypoglycemia: Resolved. Secondary to poor oral intake from nausea. CBG's now increasing, Start on SSI.  Await A1C    Type 2 diabetes, controlled, with peripheral circulatory disorder: Chronic problem.A1C pending. Resume SSI.  Essential hypertension: Controlled with  Carvedilol and Furosemide.   Anemia: Mild drop in Hb likley due to IVF, acute illness. Monitor periodically  Hyperlipidemia with target LDL less than 100:  Continue Simvastatin.   Chronic renal failure, stage 3 (moderate): creatinine stable, monitor lytes periodically   Status post transmetatarsal amputation of right foot: Parially open wound. X Ray negative, given hx of open wound for a month, awaiting MRI to make sure no osteomyelitis.  PAD (peripheral artery disease): Chronic problem.   Osteoarthritis of both knees: Chronic problem.   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   TIA (transient ischemic attack): Hx of TIA. Continue ASA and Simvastatin.   Hx of Recent Urinary Retention: with a foley cather in place since Sept-will attempt voiding trial today  Disposition: Remain inpatient-SNF in 1-2 days  Antibiotics:  Primaxin 11/20>>  Rocephin 11/20>>11/20   Anti-infectives    Start     Dose/Rate Route Frequency Ordered Stop   12/18/13 0800  cefTRIAXone (ROCEPHIN) 1 g in dextrose 5 % 50 mL IVPB - Premix   Status:  Discontinued     1 g100 mL/hr over 30 Minutes Intravenous Every 24 hours 12/18/13 0632 12/18/13 0722   12/18/13 0745  imipenem-cilastatin (PRIMAXIN) 500 mg in sodium chloride 0.9 % 100 mL IVPB     500 mg200 mL/hr over 30 Minutes Intravenous 3 times per day 12/18/13 0732     12/18/13 0045  piperacillin-tazobactam (ZOSYN) IVPB 3.375 g     3.375 g100 mL/hr over 30 Minutes Intravenous  Once 12/18/13 0034 12/18/13 0315   12/18/13 0045  vancomycin (VANCOCIN) IVPB 1000 mg/200 mL premix     1,000 mg200 mL/hr over 60 Minutes Intravenous  Once 12/18/13 0036 12/18/13 0421      DVT Prophylaxis: Prophylactic Heparin   Code Status: Full code   Family Communication None at bedside  Procedures:  None  CONSULTS:  None  MEDICATIONS: Scheduled Meds: . aspirin EC  325 mg Oral Daily  . brinzolamide  1 drop Left Eye BID   And  . brimonidine  1 drop Left Eye BID  . carvedilol  3.125 mg Oral BID WC  . collagenase   Topical Daily  . DULoxetine  60 mg Oral Daily  . furosemide  40 mg Oral Daily  . gabapentin  300 mg Oral BID  . heparin  5,000 Units Subcutaneous 3 times per day  . imipenem-cilastatin  500 mg Intravenous 3 times per day  . insulin aspart  0-9 Units Subcutaneous TID WC  . metoCLOPramide  5 mg Oral TID  . multivitamin with minerals  1 tablet Oral Daily  . protein supplement  1 scoop Oral TID WC  . simvastatin  20 mg Oral Daily  . sodium chloride  2 g Oral TID  . timolol  1 drop Left Eye BID   Continuous Infusions: . sodium chloride 10 mL/hr at 12/19/13 1948   PRN Meds:.acetaminophen, dextrose, HYDROcodone-acetaminophen, hydrocortisone, ipratropium-albuterol, ondansetron **OR** ondansetron (ZOFRAN) IV    PHYSICAL EXAM: Vital signs in last 24 hours: Filed Vitals:   12/19/13 2117 12/20/13 0109 12/20/13 0548 12/20/13 0925  BP: 157/79 149/76 146/66 145/60  Pulse: 74 82 64 62  Temp: 98.1 F (36.7 C) 98.1 F (36.7 C) 97.5 F (36.4 C) 99 F (37.2 C)  TempSrc: Oral  Axillary Oral Oral  Resp: 22 18 20 20   Height:      Weight:   91.989 kg (202 lb 12.8 oz)   SpO2: 100% 97% 97% 100%    Weight change: -108.605 kg (-239 lb 6.9 oz) Filed Weights   12/19/13 0500 12/19/13 1435 12/20/13 0548  Weight: 202.5 kg (446 lb 6.9 oz) 93.895 kg (207 lb) 91.989 kg (202 lb 12.8 oz)   Body mass index is 37.08 kg/(m^2).   Gen Exam: Awake and alert with clear speech.   Neck: Supple, No JVD.   Chest: B/L Clear.   CVS: S1 S2 Regular, no murmurs.  Abdomen: soft, BS +, non tender, non distended.  Extremities: no edema, lower extremities warm to touch. Neurologic: Non Focal.   Skin: No Rash.    Intake/Output from previous day:  Intake/Output Summary (Last 24 hours) at 12/20/13 1039 Last data filed at 12/20/13 0926  Gross per 24 hour  Intake    480 ml  Output   1225 ml  Net   -745 ml     LAB RESULTS: CBC  Recent Labs Lab 12/18/13 0013 12/19/13 0638  WBC 10.6* 8.3  HGB 11.2* 10.9*  HCT 36.1 35.2*  PLT 269 246  MCV 87.0 87.1  MCH 27.0 27.0  MCHC 31.0 31.0  RDW 18.5* 18.6*  LYMPHSABS 0.9  --   MONOABS 0.3  --   EOSABS 0.1  --   BASOSABS 0.0  --     Chemistries   Recent Labs Lab 12/18/13 0013 12/19/13 0638  NA 137 139  K 3.8 4.2  CL 98 101  CO2 27 25  GLUCOSE 134* 224*  BUN 15 16  CREATININE 0.73 0.92  CALCIUM 8.7 8.4    CBG:  Recent Labs Lab 12/18/13 1714 12/19/13 1112 12/19/13 1637 12/19/13 2113 12/20/13 0652  GLUCAP 155* 221* 226* 210* 201*    GFR Estimated Creatinine Clearance: 59.2 mL/min (by C-G formula based on Cr of 0.92).  Coagulation profile  Recent Labs Lab 12/18/13 0810  INR 1.04    Cardiac Enzymes  Recent Labs Lab 12/18/13 0013  TROPONINI <0.30    Invalid input(s): POCBNP No results for input(s): DDIMER in the last 72 hours. No results for input(s): HGBA1C in the last 72 hours. No results for input(s): CHOL, HDL, LDLCALC, TRIG, CHOLHDL, LDLDIRECT in the last 72 hours.  Recent Labs   12/18/13 0810  TSH 0.858   No results for input(s): VITAMINB12, FOLATE, FERRITIN, TIBC, IRON, RETICCTPCT in the last 72 hours. No results for input(s): LIPASE, AMYLASE in the last 72 hours.  Urine Studies No results for input(s): UHGB, CRYS in the last 72 hours.  Invalid input(s): UACOL, UAPR, USPG, UPH, UTP, UGL, UKET, UBIL, UNIT, UROB, ULEU, UEPI, UWBC, URBC, UBAC, CAST, UCOM, BILUA  MICROBIOLOGY: Recent Results (from the past 240 hour(s))  Urine culture     Status: None (Preliminary result)   Collection Time: 12/18/13 12:52 AM  Result Value Ref Range Status   Specimen Description URINE, CATHETERIZED  Final   Special Requests NONE  Final   Culture  Setup Time   Final    12/18/2013 08:55 Performed at [facility]   Final    >=100,000 COLONIES/ML Performed at [facility]    Culture   Final    [place] Performed at [facility]    Report Status PENDING  Incomplete  Blood culture (routine x 2)     Status: None (Preliminary result)   Collection Time: 12/18/13 12:55 AM  Result Value Ref Range Status   Specimen Description BLOOD RIGHT HAND  Final   Special Requests BOTTLES DRAWN AEROBIC ONLY 8CC  Final   Culture  Setup Time   Final    12/18/2013 09:26 Performed at [facility]    Culture   Final           BLOOD CULTURE RECEIVED NO GROWTH TO DATE CULTURE WILL BE HELD FOR 5 DAYS BEFORE ISSUING A FINAL NEGATIVE REPORT Performed at [facility]    Report Status PENDING  Incomplete  Blood culture (routine x 2)     Status: None (Preliminary result)   Collection Time: 12/18/13  1:00 AM  Result Value Ref Range Status   Specimen Description BLOOD RIGHT ARM  Final   Special Requests BOTTLES DRAWN AEROBIC AND ANAEROBIC 10CC  Final   Culture  Setup Time   Final    12/18/2013 09:26 Performed at [facility]    Culture   Final           BLOOD CULTURE RECEIVED NO GROWTH TO DATE CULTURE WILL BE HELD FOR 5  DAYS BEFORE ISSUING A FINAL NEGATIVE REPORT Performed at [facility]    Report Status PENDING  Incomplete  Clostridium Difficile by PCR     Status: None   Collection Time: 12/18/13  2:31 AM  Result Value Ref Range Status   C difficile by pcr NEGATIVE NEGATIVE Final  MRSA PCR Screening     Status: None   Collection Time: 12/18/13 11:52 AM  Result Value Ref Range Status   MRSA by PCR NEGATIVE NEGATIVE Final    Comment:        The GeneXpert MRSA Assay (FDA approved for NASAL specimens only), is one component of a comprehensive MRSA colonization surveillance program. It is not intended to diagnose MRSA infection nor to guide or monitor treatment for MRSA infections.     RADIOLOGY STUDIES/RESULTS: Ct Head Wo Contrast  12/18/2013   CLINICAL DATA:  Altered mental status. Nursing home patient, hypoglycemia and unresponsive.  EXAM: CT HEAD WITHOUT CONTRAST  TECHNIQUE: Contiguous axial images were obtained from the base of the skull through the vertex without intravenous contrast.  COMPARISON:  CT of the head February 14, 2012  FINDINGS: Severe hydrocephalus, with ballooning of the temporal horns, unchanged. Anterior recess of the third ventricle is 12 mm, unchanged. Similar periventricular white matter hypodensity. Patchy bilateral thalamus hypodensity is unchanged without mass effect, midline shift. High RIGHT frontal transcortical encephalomalacia. No acute large vascular territory infarct. No hemorrhage.  No abnormal extra-axial fluid collections. Basal cisterns are patent. Severe calcific atherosclerosis of the carotid bulbs.  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 No paranasal sinus air-fluid levels. LEFT middle ear and mastoid effusion, trace RIGHT mastoid effusion.  IMPRESSION: No acute intracranial process.  Stable severe hydrocephalus. Similar periventricular white matter hypodensities may reflect chronic  small vessel ischemic disease and/or transependymal  flow cerebral spinal fluid. RIGHT frontal encephalomalacia is likely posttraumatic. Nonspecific stable bilateral thalamic hypodensities.  LEFT middle ear and LEFT greater than RIGHT mastoid effusions.   Electronically Signed   By: Elon Alas   On: 12/18/2013 02:22   Dg Foot Complete Right  12/18/2013   CLINICAL DATA:  Wound drainage, RIGHT foot amputation 09/07/2013, history diabetes, hypertension, chronic renal failure  EXAM: RIGHT FOOT COMPLETE - 3+ VIEW  COMPARISON:  RIGHT foot radiographs and MR RIGHT foot 02/17/2012  FINDINGS: Interval transmetatarsal amputation.  Soft tissue swelling at distal foot stump.  Soft tissue gas identified at the lateral aspect of the distal foot, distal to the fifth metatarsal.  No acute fracture or dislocation.  No definite bone destruction.  Scattered small vessel vascular calcification.  IMPRESSION: Interval transmetatarsal amputation.  Soft tissue gas at lateral aspect of stump distal to the fifth metatarsal bone question related to ulcer or wound breakdown.  No definite bone destruction is seen to suggest osteomyelitis.  However if osteomyelitis remains a clinical concern, recommend MR imaging.   Electronically Signed   By: Lavonia Dana M.D.   On: 12/18/2013 17:07    Oren Binet, MD  [facility] Pager:[phone]  If 7PM-7AM, please contact night-coverage [email or web address] Password [email or web address] 12/20/2013, 10:39 AM   LOS: 3 days

## 2013-12-20 NOTE — Plan of Care (Signed)
Problem: Phase I Progression Outcomes Goal: Adequate I & O Outcome: Progressing Goal: Other Phase I Outcomes/Goals Outcome: Progressing  Problem: Phase II Progression Outcomes Goal: Vital signs remain stable, temperature < 100 Outcome: Completed/Met Date Met:  12/20/13 Goal: Tolerating diet Outcome: Completed/Met Date Met:  12/20/13 Goal: Discharge plan established Outcome: Completed/Met Date Met:  12/20/13 Goal: Other Phase II Outcomes/Goals Outcome: Progressing

## 2013-12-21 LAB — GLUCOSE, CAPILLARY
GLUCOSE-CAPILLARY: 161 mg/dL — AB (ref 70–99)
GLUCOSE-CAPILLARY: 194 mg/dL — AB (ref 70–99)
Glucose-Capillary: 244 mg/dL — ABNORMAL HIGH (ref 70–99)
Glucose-Capillary: 193 mg/dL — ABNORMAL HIGH (ref 70–99)

## 2013-12-21 LAB — URINE CULTURE

## 2013-12-21 MED ORDER — HYDROCODONE-ACETAMINOPHEN 5-325 MG PO TABS: ORAL_TABLET | ORAL | Status: DC

## 2013-12-21 MED ORDER — COLLAGENASE 250 UNIT/GM EX OINT
TOPICAL_OINTMENT | Freq: Every day | CUTANEOUS | Status: DC
Start: 2013-12-21 — End: 2014-05-25

## 2013-12-21 MED ORDER — INSULIN ASPART 100 UNIT/ML ~~LOC~~ SOLN: SUBCUTANEOUS | Status: DC

## 2013-12-21 MED ORDER — SODIUM CHLORIDE 0.9 % IV SOLN: 1.0000 g | INTRAVENOUS | Status: DC

## 2013-12-21 NOTE — Progress Notes (Signed)
Assessed right arm with ultrasound for PICC insertion however veins very, very small.   Attempted basilic vein in left arm without success.   Vein very small and deep.   Unable to thread guidewire.  Unable to visualize cephalic or brachial veins in left arm.   Called IR.   They are unable to get to her today.   They can do tomorrow.   Primary RN made aware.

## 2013-12-21 NOTE — Plan of Care (Signed)
Problem: Phase I Progression Outcomes Goal: Voiding-avoid urinary catheter unless indicated Outcome: Not Applicable Date Met:  79/39/03 Pt has foley for retention Goal: Adequate I & O Outcome: Completed/Met Date Met:  12/21/13 Goal: Other Phase I Outcomes/Goals Outcome: Completed/Met Date Met:  12/21/13  Problem: Phase II Progression Outcomes Goal: Progress activity as tolerated unless otherwise ordered Outcome: Adequate for Discharge Goal: Voiding independently Outcome: Not Applicable Date Met:  00/92/33 Pt has foley catheter Goal: Other Phase II Outcomes/Goals Outcome: Progressing

## 2013-12-21 NOTE — Progress Notes (Signed)
Peripherally Inserted Central Catheter/Midline Placement  The IV Nurse has discussed with the patient and/or persons authorized to consent for the patient, the purpose of this procedure and the potential benefits and risks involved with this procedure.  The benefits include less needle sticks, lab draws from the catheter and patient may be discharged home with the catheter.  Risks include, but not limited to, infection, bleeding, blood clot (thrombus formation), and puncture of an artery; nerve damage and irregular heat beat.  Alternatives to this procedure were also discussed.  PICC/Midline Placement Documentation        [name] 12/21/2013, 3:00 PM

## 2013-12-21 NOTE — Discharge Summary (Addendum)
Physician Discharge Summary  [name] OAC:[id number] DOB: 1940-04-07 DOA: 12/17/2013  PCP: Hollace Kinnier, DO  Admit date: 12/17/2013 Discharge date: 12/22/2013  Time spent: 60 minutes  Recommendations for Outpatient Follow-up:  1. Urine culture positive for ESBL.  Continue Invanz 1g IV daily for 2 weeks from 11/20.  2. Have discontinued all incident with the exception of sliding scale insulin. Please resume other insulin when able. 3. Voiding trial to remove Foley catheter failed during this admission, please attempt a voiding trial in the next few weeks again. Please consider referral to urology.  Discharge Diagnoses:  Principal Problem:   Acute encephalopathy Active Problems:   Type 2 diabetes, controlled, with peripheral circulatory disorder   Essential hypertension   Osteoarthritis of both knees   Anemia   Hyperlipidemia with target LDL less than 100   Chronic renal failure, stage 3 (moderate)   Status post transmetatarsal amputation of right foot   PAD (peripheral artery disease)   Blindness   TIA (transient ischemic attack)   Hypothermia   UTI (lower urinary tract infection)   Diarrhea   Discharge Condition: Stable  Diet recommendation: Heart healthy and diabetic diet  Filed Weights   12/20/13 0548 12/21/13 0500 12/22/13 0500  Weight: 91.989 kg (202 lb 12.8 oz) 94.348 kg (208 lb) 96.299 kg (212 lb 4.8 oz)    History of present illness:  73 yo female with PMH of diabetes mellitus, b/l blindness, CKD- stage III, hypertension, peripheral vascular disease, hx of TIA, and s/p recent right transmetatarsal amputation who presents with altered mental status. Pt is a resident of [facility] facility. Staff report she has been doing well over the past several days but was found unresponsive by staff. Blood glucose when checked by EMS was 63. Pt was given glucagon and blood glucose was rechecked and found to be 60. O2 sat was 85% and she was placed on a non  rebreather. In route CBG was 90. No seizure activity noted. Pt was found to be hypothermic- bear hugger started in ED. Mental status improved gradually. Admitting physician found pt to be AAOx3. Pt reports nausea starting 11/19 but no vomiting. Denies diarrhea but nurses noticed large volme of loose stool in ED. No fever, chills, cough, shortness of breath, chest pain. She has indwelling catheter but denies symptoms of UTI. Work up in the ED demonstrates +UA for UTI; lactate 0.86; negative troponin; WBC 10.6; body temperatuve 96.5. Head CT showed no acute abnormalities.  Hospital Course:  Pt greatly improved since admission.  Urine culture positive for Klebsiella pneumoniae with confirmed ESBL treatment with Invanz started on 11/20.  Failed voiding trial- developed acute urinary retention when foley removed.  New foley catheter placed on 11/22.     UTI: Likely from pt's chronic indwelling indwelling catheter. Catheter changed on admission and 11/22. VSSAF. Urine culture positive for Klebsiella pneumoniae with confirmed ESBL.  PICC line placed.  Continue Invanz 1g IV daily for 2 weeks from 11/20.    Diarrhea: Resolved. C diff negative.    Hypothermia: Resolved. Secondary to hypoglycemia.   Hypoglycemia: Resolved. Secondary to poor oral intake from nausea. CBG's have increased, initially all his insulin was discontinued, however has not been started on sliding scale insulin. A1c at 6.2     Type 2 diabetes, controlled, with peripheral circulatory disorder: Chronic problem.  A1C 6.2.  Will continue  SSI at SNF.  Levemir and other insulin have been discontinued, please resume when able.  Essential hypertension: Chronic problem.  Stable.  Last BP 107/50.  ContinueCarvedilol and Furosemide.   Anemia: Mild drop in Hb likley due to IVF, acute illness. Please monitor CBC periodically.  Hyperlipidemia with target LDL less than 100: Chronic problem.  Continue  Simvastatin.   Chronic renal failure, stage 3 (moderate): creatinine stable, monitor lytes periodically   Status post transmetatarsal amputation of right foot: Parially open wound. X Ray and MRI of foot negative for osteomyelitis.  Wound care nurse recommends enzymatic debridement agent to clean away necrotic tissue, and packing for moist wound healing. Change daily. Will need follow up for wound care at DC.    PAD (peripheral artery disease): Chronic problem.   Osteoarthritis of both knees: Chronic problem.   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   TIA (transient ischemic attack): Hx of TIA. Continue ASA and Simvastatin.   Hx of Recent Urinary Retention: with a foley cather in place since Sept.  Voiding trial failed- developed acute urinary retention.  New foley placed 11/22.  Will need to be changed once per month.          Antibiotics: Invanz 1g IV q24h x 2 weeks from 11/20  Procedures:  None  Consultations:  Wound care  Discharge Exam: Filed Vitals:   12/22/13 0921  BP: 104/71  Pulse: 75  Temp: 98.2 F (36.8 C)  Resp: 20    General: Appears to be in poor health, NAD, appears stated age  73:  Anicteic Sclera, MMM.    Cardiovascular: RRR, S1 S2 auscultated, no rubs, murmurs or gallops.   Respiratory: Clear to auscultation bilaterally with equal chest rise  Abdomen: Soft, nontender, nondistended, + bowel sounds  Extremities: warm dry without cyanosis clubbing or edema.  Right foot- transmetatarsal amputation.  Neuro: AAOx3, cranial nerves grossly intact.  Skin: Without rashes exudates or nodules.   Psych: Normal affect and demeanor with intact judgement and insight  Discharge Instructions    Diet - low sodium heart healthy    Complete by:  As directed      Discharge wound care:    Complete by:  As directed   Dressing procedure/placement/frequency: enzymatic debridement agent to clean away  necrotic tissue, and packing for moist wound healing. Change daily. Will need follow up for wound care at DC      Increase activity slowly    Complete by:  As directed           Current Discharge Medication List    START taking these medications   Details  collagenase (SANTYL) ointment Apply topically daily. Qty: 15 g, Refills: 0    ertapenem 1 g in sodium chloride 0.9 % 50 mL Inject 1 g into the vein daily. 2 weeks from 12/18/13 Qty: 11 g, Refills: 0    insulin aspart (NOVOLOG) 100 UNIT/ML injection 0-9 Units, Subcutaneous, 3 times daily with meals,  CBG < 70: implement hypoglycemia protocol CBG 70 - 120: 0 units CBG 121 - 150: 1 unit CBG 151 - 200: 2 units CBG 201 - 250: 3 units CBG 251 - 300: 5 units CBG 301 - 350: 7 units CBG 351 - 400: 9 units CBG > 400: call MD Qty: 10 mL, Refills: 11      CONTINUE these medications which have CHANGED   Details  HYDROcodone-acetaminophen (NORCO/VICODIN) 5-325 MG per tablet Take one tablet by mouth every 6 hours scheduled for pain Qty: 20 tablet, Refills: 0    metoCLOPramide (REGLAN) 5 MG tablet Take 1  tablet (5 mg total) by mouth 4 (four) times daily -  before meals and at bedtime.      CONTINUE these medications which have NOT CHANGED   Details  acetaminophen (TYLENOL) 325 MG tablet Take 650 mg by mouth every 6 (six) hours as needed for fever.     aspirin EC 325 MG tablet Take 325 mg by mouth daily.      Brinzolamide-Brimonidine (SIMBRINZA) 1-0.2 % SUSP Place 1 drop into the left eye 2 (two) times daily.    carvedilol (COREG) 3.125 MG tablet Take 3.125 mg by mouth 2 (two) times daily with a meal.    docusate sodium (COLACE) 100 MG capsule Take 100 mg by mouth 2 (two) times daily.    DULoxetine (CYMBALTA) 60 MG capsule Take 1 capsule (60 mg total) by mouth daily. 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    hydrocortisone (ANUSOL-HC) 2.5 % rectal cream Place 1 application rectally 2 (two) times daily as needed for hemorrhoids.     ipratropium-albuterol (DUONEB) 0.5-2.5 (3) MG/3ML SOLN Take 3 mLs by nebulization every 6 (six) hours as needed (wheezing).    Multiple Vitamins-Minerals (MULTIVITAMIN WITH MINERALS) tablet Take 1 tablet by mouth daily.    polyethylene glycol (MIRALAX / GLYCOLAX) packet Take 17 g by mouth daily. Qty: 14 each, Refills: 0    promethazine (PHENERGAN) 25 MG tablet Take 25 mg by mouth every 6 (six) hours as needed for nausea or vomiting.    protein supplement (RESOURCE BENEPROTEIN) POWD Take 1 scoop by mouth 3 (three) times daily with meals.    simvastatin (ZOCOR) 20 MG tablet Take 20 mg by mouth daily.    sodium chloride (OCEAN) 0.65 % SOLN nasal spray Place 1 spray into the nose 4 (four) times daily as needed for congestion.    sodium chloride 1 G tablet Take 2 g by mouth 3 (three) times daily.    timolol (TIMOPTIC) 0.5 % ophthalmic solution Place 1 drop into the left eye 2 (two) times daily.    gabapentin (NEURONTIN) 100 MG capsule Take 300 mg by mouth 2 (two) times daily. Phantom pain      STOP taking these medications     insulin detemir (LEVEMIR) 100 UNIT/ML injection      insulin lispro (HUMALOG KWIKPEN) 100 UNIT/ML KiwkPen      insulin lispro (HUMALOG) 100 UNIT/ML injection      oxyCODONE-acetaminophen (PERCOCET/ROXICET) 5-325 MG per tablet      promethazine (PHENERGAN) 25 MG/ML injection        No Known Allergies Follow-up Information    Follow up with REED, TIFFANY, DO. Schedule an appointment as soon as possible for a visit in 1 week.   Specialty:  Geriatric Medicine   Contact information:   [address] [address] [phone]        The results of significant diagnostics from this hospitalization (including imaging, microbiology, ancillary  and laboratory) are listed below for reference.    Significant Diagnostic Studies: Ct Head Wo Contrast  12/18/2013   CLINICAL DATA:  Altered mental status. Nursing home patient, hypoglycemia and unresponsive.  EXAM: CT HEAD WITHOUT CONTRAST  TECHNIQUE: Contiguous axial images were obtained from the base of the skull through the vertex without intravenous contrast.  COMPARISON:  CT  of the head February 14, 2012  FINDINGS: Severe hydrocephalus, with ballooning of the temporal horns, unchanged. Anterior recess of the third ventricle is 12 mm, unchanged. Similar periventricular white matter hypodensity. Patchy bilateral thalamus hypodensity is unchanged without mass effect, midline shift. High RIGHT frontal transcortical encephalomalacia. No acute large vascular territory infarct. No hemorrhage.  No abnormal extra-axial fluid collections. Basal cisterns are patent. Severe calcific atherosclerosis of the carotid bulbs.  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 No paranasal sinus air-fluid levels. LEFT middle ear and mastoid effusion, trace RIGHT mastoid effusion.  IMPRESSION: No acute intracranial process.  Stable severe hydrocephalus. Similar periventricular white matter hypodensities may reflect chronic small vessel ischemic disease and/or transependymal flow cerebral spinal fluid. RIGHT frontal encephalomalacia is likely posttraumatic. Nonspecific stable bilateral thalamic hypodensities.  LEFT middle ear and LEFT greater than RIGHT mastoid effusions.   Electronically Signed   By: Elon Alas   On: 12/18/2013 02:22   Mr Foot Right Wo Contrast  12/20/2013   CLINICAL DATA:  Osteomyelitis with wound drainage. Prior foot amputation 09/07/2013 at the proximal metatarsal level.  EXAM: MRI OF THE RIGHT FOREFOOT WITHOUT CONTRAST  TECHNIQUE: Multiplanar, multisequence MR imaging was performed. No intravenous contrast was administered.  COMPARISON:  12/18/2013 radiographs  FINDINGS: Despite  efforts by the technologist and patient, motion artifact is present on today's exam and could not be eliminated. This reduces exam sensitivity and specificity.  No definite osseous edema to suggest osteomyelitis. Abnormal thin soft tissues along the distal stump margin laterally particulate just anterior to the remaining fourth metatarsal dorsally as on image 7 of series 9 and image 16 of series 3. There may well be some soft tissue breakdown in this vicinity.  No drainable abscess observed. No current compelling findings of gas tracking in the soft tissues of the foot, although gas within the distal soft tissues was a concern on prior conventional radiographs. Some of this appearance was probably due to bandaging along the wound site.  IMPRESSION: 1. No findings of osteomyelitis or abscess, but the soft tissues laterally along the stump appear very thin overlying the distal fourth and fifth metatarsal amputation margins dorsally, possibly from ulceration or soft tissue breakdown.   Electronically Signed   By: Sherryl Barters M.D.   On: 12/20/2013 19:25   Dg Foot Complete Right  12/18/2013   CLINICAL DATA:  Wound drainage, RIGHT foot amputation 09/07/2013, history diabetes, hypertension, chronic renal failure  EXAM: RIGHT FOOT COMPLETE - 3+ VIEW  COMPARISON:  RIGHT foot radiographs and MR RIGHT foot 02/17/2012  FINDINGS: Interval transmetatarsal amputation.  Soft tissue swelling at distal foot stump.  Soft tissue gas identified at the lateral aspect of the distal foot, distal to the fifth metatarsal.  No acute fracture or dislocation.  No definite bone destruction.  Scattered small vessel vascular calcification.  IMPRESSION: Interval transmetatarsal amputation.  Soft tissue gas at lateral aspect of stump distal to the fifth metatarsal bone question related to ulcer or wound breakdown.  No definite bone destruction is seen to suggest osteomyelitis.  However if osteomyelitis remains a clinical concern, recommend  MR imaging.   Electronically Signed   By: Lavonia Dana M.D.   On: 12/18/2013 17:07    Microbiology: Recent Results (from the past 240 hour(s))  Urine culture     Status: None   Collection Time: 12/18/13 12:52 AM  Result Value Ref Range Status   Specimen Description URINE, CATHETERIZED  Final   Special Requests NONE  Final  Culture  Setup Time   Final    12/18/2013 08:55 Performed at [facility]   Final    >=100,000 COLONIES/ML Performed at [facility]    Culture   Final    KLEBSIELLA PNEUMONIAE Note: Confirmed Extended Spectrum Beta-Lactamase Producer (ESBL) CRITICAL RESULT CALLED TO, READ BACK BY AND VERIFIED WITH: JESSICA STEVENSON PA ON [id number] BY [place] Performed at [facility]    Report Status 12/21/2013 FINAL  Final   Organism ID, Bacteria KLEBSIELLA PNEUMONIAE  Final      Susceptibility   Klebsiella pneumoniae - MIC*    AMPICILLIN >=32 RESISTANT Resistant     CEFAZOLIN >=64 RESISTANT Resistant     CEFTRIAXONE >=64 RESISTANT Resistant     CIPROFLOXACIN >=4 RESISTANT Resistant     GENTAMICIN <=1 SENSITIVE Sensitive     LEVOFLOXACIN 4 INTERMEDIATE Intermediate     NITROFURANTOIN 32 SENSITIVE Sensitive     TOBRAMYCIN 8 INTERMEDIATE Intermediate     TRIMETH/SULFA >=320 RESISTANT Resistant     IMIPENEM <=0.25 SENSITIVE Sensitive     PIP/TAZO 8 SENSITIVE Sensitive     * KLEBSIELLA PNEUMONIAE  Blood culture (routine x 2)     Status: None (Preliminary result)   Collection Time: 12/18/13 12:55 AM  Result Value Ref Range Status   Specimen Description BLOOD RIGHT HAND  Final   Special Requests BOTTLES DRAWN AEROBIC ONLY 8CC  Final   Culture  Setup Time   Final    12/18/2013 09:26 Performed at [facility]    Culture   Final           BLOOD CULTURE RECEIVED NO GROWTH TO DATE CULTURE WILL BE HELD FOR 5 DAYS BEFORE ISSUING A FINAL NEGATIVE REPORT Performed at [facility]    Report Status PENDING  Incomplete  Blood  culture (routine x 2)     Status: None (Preliminary result)   Collection Time: 12/18/13  1:00 AM  Result Value Ref Range Status   Specimen Description BLOOD RIGHT ARM  Final   Special Requests BOTTLES DRAWN AEROBIC AND ANAEROBIC 10CC  Final   Culture  Setup Time   Final    12/18/2013 09:26 Performed at [facility]    Culture   Final           BLOOD CULTURE RECEIVED NO GROWTH TO DATE CULTURE WILL BE HELD FOR 5 DAYS BEFORE ISSUING A FINAL NEGATIVE REPORT Performed at [facility]    Report Status PENDING  Incomplete  Clostridium Difficile by PCR     Status: None   Collection Time: 12/18/13  2:31 AM  Result Value Ref Range Status   C difficile by pcr NEGATIVE NEGATIVE Final  MRSA PCR Screening     Status: None   Collection Time: 12/18/13 11:52 AM  Result Value Ref Range Status   MRSA by PCR NEGATIVE NEGATIVE Final    Comment:        The GeneXpert MRSA Assay (FDA approved for NASAL specimens only), is one component of a comprehensive MRSA colonization surveillance program. It is not intended to diagnose MRSA infection nor to guide or monitor treatment for MRSA infections.      Labs: Basic Metabolic Panel:  Recent Labs Lab 12/18/13 0013 12/19/13 0638  NA 137 139  K 3.8 4.2  CL 98 101  CO2 27 25  GLUCOSE 134* 224*  BUN 15 16  CREATININE 0.73 0.92  CALCIUM 8.7 8.4   Liver  Function Tests:  Recent Labs Lab 12/18/13 0013 12/19/13 0638  AST 49* 23  ALT 17 13  ALKPHOS 217* 183*  BILITOT 0.4 0.2*  PROT 6.8 6.7  ALBUMIN 2.3* 2.3*   CBC:  Recent Labs Lab 12/18/13 0013 12/19/13 0638  WBC 10.6* 8.3  NEUTROABS 9.3*  --   HGB 11.2* 10.9*  HCT 36.1 35.2*  MCV 87.0 87.1  PLT 269 246   Cardiac Enzymes:  Recent Labs Lab 12/18/13 0013  TROPONINI <0.30   CBG:  Recent Labs Lab 12/21/13 0700 12/21/13 1104 12/21/13 1617 12/21/13 2147 12/22/13 0642  GLUCAP 161* 194* 244* 193* 165*    Signed: Adelene Idler PA-S [facility] 12/22/2013, 10:41 AM   AttendingPatient was seen, examined,treatment plan was discussed with the  Advance Practice Provider.  I have directly reviewed the clinical findings, lab, imaging studies and management of this patient in detail. I have made the necessary changes to the above noted documentation, and agree with the documentation, as recorded by the Advance Practice Provider.   Doing well, CBGs remained stable. Urine cultures showing ESBL producing Klebsiella. Will discharge on Invanz. Unfortunately, failed a voiding trial, Foley catheter reinserted. Will need outpatient voiding trial and urology referral.  Addendum 11/24: Discharge held on 11/23, as PICC line unable to be placed. It remained stable, minimal nausea this morning. Vitals remained stable. Abdomen exam is benign. Lungs are clear on exam. She is essentially unchanged, and is stable to be discharged back to SNF today once PICC line is placed.  Nena Alexander MD [facility].

## 2013-12-21 NOTE — Progress Notes (Signed)
Physical Therapy Treatment Patient Details Name: [name] MRN: [id number] DOB: 1940/06/27 Today's Date: 12/21/2013    History of Present Illness 73 yo female who is long term care resident has been admitted for acute encephalopathy related to UTI and hypoglycemia    PT Comments    Patient continues to be very limited with participation although when asked she stated,  " I get up like everyone else does". Recommend lift equipment for OOB. Continue to recommend SNF for ongoing Physical Therapy.     Follow Up Recommendations  SNF     Equipment Recommendations  None recommended by PT    Recommendations for Other Services       Precautions / Restrictions Precautions Precautions: Fall Restrictions Weight Bearing Restrictions: No    Mobility  Bed Mobility Overal bed mobility: +2 for physical assistance;+ 2 for safety/equipment             General bed mobility comments: Pt is completely passive about transitions, and does not initiate any reach or sequencing. Did not assist with transfer at all. Use of chuck pad to scoot EOB  Transfers                 General transfer comment: Pt will require a lift to succeed. Unable to attempt transfer safely this session as patient unable to maintian static sitting balance without +2 assist  Ambulation/Gait                 Stairs            Wheelchair Mobility    Modified Rankin (Stroke Patients Only)       Balance     Sitting balance-Leahy Scale: Zero Sitting balance - Comments: reqiured +2 A with sitting balance this session. Heavy posterior lean andunable to lean anterior to support                            Cognition Arousal/Alertness: Lethargic Behavior During Therapy: Agitated Overall Cognitive Status: No family/caregiver present to determine baseline cognitive functioning                      Exercises      General Comments        Pertinent Vitals/Pain Pain  Assessment: No/denies pain    Home Living                      Prior Function            PT Goals (current goals can now be found in the care plan section) Progress towards PT goals: Not progressing toward goals - comment    Frequency  Min 2X/week    PT Plan Current plan remains appropriate    Co-evaluation             End of Session   Activity Tolerance: Patient limited by fatigue;Patient limited by lethargy Patient left: in bed;with call bell/phone within reach;with bed alarm set     Time: 0174-9449 PT Time Calculation (min) (ACUTE ONLY): 16 min  Charges:  $Therapeutic Activity: 8-22 mins                    G Codes:      [name] 12/21/2013, 11:18 AM 12/21/2013 [name] PTA [phone] pager [phone] office

## 2013-12-21 NOTE — Clinical Social Work Note (Signed)
Clinical Social Worker facilitated patient discharge including contacting patient's legal guardian, family and facility to confirm patient discharge plans.  Clinical information faxed to facility and family agreeable with plan.  CSW arranged ambulance transport via [facility] to [facility].  RN to call report prior to discharge.  DC packet on chart.   Clinical Social Worker will sign off for now as social work intervention is no longer needed. Please consult [place] again if new need arises.  Glendon Axe, MSW, LCSWA [phone] 12/21/2013 11:13 AM

## 2013-12-22 ENCOUNTER — Inpatient Hospital Stay (HOSPITAL_COMMUNITY): Payer: Medicare Other

## 2013-12-22 DIAGNOSIS — H540 Blindness, both eyes: Secondary | ICD-10-CM

## 2013-12-22 LAB — GLUCOSE, CAPILLARY
Glucose-Capillary: 165 mg/dL — ABNORMAL HIGH (ref 70–99)
Glucose-Capillary: 170 mg/dL — ABNORMAL HIGH (ref 70–99)
Glucose-Capillary: 172 mg/dL — ABNORMAL HIGH (ref 70–99)

## 2013-12-22 MED ORDER — METOCLOPRAMIDE HCL 5 MG PO TABS
5.0000 mg | ORAL_TABLET | Freq: Three times a day (TID) | ORAL | Status: DC
  Administered 2013-12-22: 5 mg via ORAL
  Filled 2013-12-22: qty 1

## 2013-12-22 MED ORDER — LIDOCAINE HCL 1 % IJ SOLN
INTRAMUSCULAR | Status: AC
  Filled 2013-12-22: qty 20

## 2013-12-22 MED ORDER — METOCLOPRAMIDE HCL 5 MG PO TABS: 5.0000 mg | ORAL_TABLET | Freq: Three times a day (TID) | ORAL | Status: AC

## 2013-12-22 NOTE — Progress Notes (Signed)
Pt discharging at this time via stretcher transport to [facility].  No complaints of pain or discomfort. IV discontinued dry dressing. PICC to right upper arm dry and intact. Foley in place. Dressing to right foot clean, dry, and intact. Pt tolerated all meds without difficulty. No noted distress. Report called in to nurse Becky at facility.

## 2013-12-22 NOTE — Progress Notes (Signed)
Pain medicine offered to pt with 10 PM med pass, pt refused.  Pt called out at 2 AM asking for pain medicine for pain 10/10 in her arms and stated she was not offered pain medicine all night. Pain medicine given to pt.  Will continue to monitor.    Fredrich Romans, RN 12/22/2013 2:18 AM

## 2013-12-22 NOTE — Plan of Care (Signed)
Problem: Phase III Progression Outcomes Goal: Afebrile, Vital Signs remain stable Outcome: Completed/Met Date Met:  12/22/13 Goal: Tolerating diet Outcome: Completed/Met Date Met:  12/22/13 Goal: Discharge plan remains appropriate-arrangements made Outcome: Completed/Met Date Met:  12/22/13

## 2013-12-22 NOTE — Progress Notes (Signed)
PATIENT DETAILS Name: [name] Age: 73 y.o. Sex: female Date of Birth: 1940-11-07 Admit Date: 12/17/2013 Admitting Physician Ivor Costa, MD [id number], TIFFANY, DO  Subjective: Mild nausea this morning, and given Zofran with significant improvement. She denies any other complaints. Claims last BM yesterday, no vomiting or abdominal pain. No diarrhea. Awaiting PICC line placement before discharge to SNF. She was discharged on 11/23, however it was not able to be placed.  Assessment/Plan: UTI: Likely from pt's chronic indwelling indwelling catheter. Catheter changed on admit. 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 Urine cs positive for ESBL Klebsiella pneumoniae. Plans are for mid line PICC placement, and discharged to SNF on 11/24. Remains afebrile.  Diarrhea: Resolved. C diff negative.    Hypothermia: Resolved. Secondary to hypoglycemia.   Hypoglycemia: Resolved. Secondary to poor oral intake from nausea. CBG's stable with SSI.  Recurrent nausea: Suspect she has underlying gastroparesis, takes Reglan, will change to before meals at bedtime. Encourage small portion meals.   Type 2 diabetes, controlled, with peripheral circulatory disorder: Chronic problem.A1C 6.2. CBG's stable with SSI.  Essential hypertension: Controlled with  Carvedilol and Furosemide.   Anemia: Mild drop in Hb likley due to IVF, acute illness. Monitor periodically  Hyperlipidemia with target LDL less than 100:  Continue Simvastatin.   Chronic renal failure, stage 3 (moderate): creatinine stable, monitor lytes periodically   Status post transmetatarsal amputation of right foot: Parially open wound. X Ray negative and MRI negative for osteomyelitis.  PAD (peripheral artery disease): Chronic problem.   Osteoarthritis of both knees: Chronic problem.   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   TIA  (transient ischemic attack): Hx of TIA. Continue ASA and Simvastatin.   Hx of Recent Urinary Retention: with a foley cather in place since Sept-voiding trial attempted this admission, failed. Foley catheter reinserted. Please repeat voiding trial while at skilled nursing facility, will need outpatient referral to urology.  Disposition: Remain inpatient-SNF today.  Antibiotics:  Primaxin 11/20>>  Rocephin 11/20>>11/20   Anti-infectives    Start     Dose/Rate Route Frequency Ordered Stop   12/21/13 0000  ertapenem 1 g in sodium chloride 0.9 % 50 mL     1 g100 mL/hr over 30 Minutes Intravenous Every 24 hours 12/21/13 1030     12/18/13 0800  cefTRIAXone (ROCEPHIN) 1 g in dextrose 5 % 50 mL IVPB - Premix  Status:  Discontinued     1 g100 mL/hr over 30 Minutes Intravenous Every 24 hours 12/18/13 0632 12/18/13 0722   12/18/13 0745  imipenem-cilastatin (PRIMAXIN) 500 mg in sodium chloride 0.9 % 100 mL IVPB     500 mg200 mL/hr over 30 Minutes Intravenous 3 times per day 12/18/13 0732     12/18/13 0045  piperacillin-tazobactam (ZOSYN) IVPB 3.375 g     3.375 g100 mL/hr over 30 Minutes Intravenous  Once 12/18/13 0034 12/18/13 0315   12/18/13 0045  vancomycin (VANCOCIN) IVPB 1000 mg/200 mL premix     1,000 mg200 mL/hr over 60 Minutes Intravenous  Once 12/18/13 0036 12/18/13 0421      DVT Prophylaxis: Prophylactic Heparin   Code Status: Full code   Family Communication None at bedside  Procedures:  None  CONSULTS:  None  MEDICATIONS: Scheduled Meds: . aspirin EC  325 mg Oral Daily  . brinzolamide  1 drop Left Eye BID   And  . brimonidine  1 drop Left  Eye BID  . carvedilol  3.125 mg Oral BID WC  . collagenase   Topical Daily  . DULoxetine  60 mg Oral Daily  . furosemide  40 mg Oral Daily  . gabapentin  300 mg Oral BID  . heparin  5,000 Units Subcutaneous 3 times per day  . imipenem-cilastatin  500 mg Intravenous 3 times per day  . insulin aspart  0-9 Units Subcutaneous  TID WC  . metoCLOPramide  5 mg Oral TID  . multivitamin with minerals  1 tablet Oral Daily  . protein supplement  1 scoop Oral TID WC  . simvastatin  20 mg Oral Daily  . sodium chloride  2 g Oral TID  . timolol  1 drop Left Eye BID   Continuous Infusions: . sodium chloride 10 mL/hr at 12/19/13 1948   PRN Meds:.acetaminophen, dextrose, HYDROcodone-acetaminophen, hydrocortisone, ipratropium-albuterol, ondansetron **OR** ondansetron (ZOFRAN) IV    PHYSICAL EXAM: Vital signs in last 24 hours: Filed Vitals:   12/22/13 0500 12/22/13 0542 12/22/13 0841 12/22/13 0921  BP:  124/65 134/60 104/71  Pulse:  75 78 75  Temp:  98.4 F (36.9 C) 98.4 F (36.9 C) 98.2 F (36.8 C)  TempSrc:  Oral Oral Oral  Resp:  18 18 20   Height:      Weight: 96.299 kg (212 lb 4.8 oz)     SpO2:  93% 100% 100%    Weight change: 1.95 kg (4 lb 4.8 oz) Filed Weights   12/20/13 0548 12/21/13 0500 12/22/13 0500  Weight: 91.989 kg (202 lb 12.8 oz) 94.348 kg (208 lb) 96.299 kg (212 lb 4.8 oz)   Body mass index is 38.82 kg/(m^2).   Gen Exam: Awake and alert with clear speech.  Is legally blind. Neck: Supple, No JVD.   Chest: B/L Clear.  No rales. CVS: S1 S2 Regular, no murmurs.  Abdomen: soft, BS +, non tender, non distended.  Extremities: no edema, lower extremities warm to touch. Neurologic: Non Focal.   Skin: No Rash.    Intake/Output from previous day:  Intake/Output Summary (Last 24 hours) at 12/22/13 1034 Last data filed at 12/22/13 0926  Gross per 24 hour  Intake    120 ml  Output    400 ml  Net   -280 ml     LAB RESULTS: CBC  Recent Labs Lab 12/18/13 0013 12/19/13 0638  WBC 10.6* 8.3  HGB 11.2* 10.9*  HCT 36.1 35.2*  PLT 269 246  MCV 87.0 87.1  MCH 27.0 27.0  MCHC 31.0 31.0  RDW 18.5* 18.6*  LYMPHSABS 0.9  --   MONOABS 0.3  --   EOSABS 0.1  --   BASOSABS 0.0  --     Chemistries   Recent Labs Lab 12/18/13 0013 12/19/13 0638  NA 137 139  K 3.8 4.2  CL 98 101  CO2 27  25  GLUCOSE 134* 224*  BUN 15 16  CREATININE 0.73 0.92  CALCIUM 8.7 8.4    CBG:  Recent Labs Lab 12/21/13 0700 12/21/13 1104 12/21/13 1617 12/21/13 2147 12/22/13 0642  GLUCAP 161* 194* 244* 193* 165*    GFR Estimated Creatinine Clearance: 60.7 mL/min (by C-G formula based on Cr of 0.92).  Coagulation profile  Recent Labs Lab 12/18/13 0810  INR 1.04    Cardiac Enzymes  Recent Labs Lab 12/18/13 0013  TROPONINI <0.30    Invalid input(s): POCBNP No results for input(s): DDIMER in the last 72 hours. No results for input(s): HGBA1C in the last  72 hours. No results for input(s): CHOL, HDL, LDLCALC, TRIG, CHOLHDL, LDLDIRECT in the last 72 hours. No results for input(s): TSH, T4TOTAL, T3FREE, THYROIDAB in the last 72 hours.  Invalid input(s): FREET3 No results for input(s): VITAMINB12, FOLATE, FERRITIN, TIBC, IRON, RETICCTPCT in the last 72 hours. No results for input(s): LIPASE, AMYLASE in the last 72 hours.  Urine Studies No results for input(s): UHGB, CRYS in the last 72 hours.  Invalid input(s): UACOL, UAPR, USPG, UPH, UTP, UGL, UKET, UBIL, UNIT, UROB, ULEU, UEPI, UWBC, URBC, UBAC, CAST, UCOM, BILUA  MICROBIOLOGY: Recent Results (from the past 240 hour(s))  Urine culture     Status: None   Collection Time: 12/18/13 12:52 AM  Result Value Ref Range Status   Specimen Description URINE, CATHETERIZED  Final   Special Requests NONE  Final   Culture  Setup Time   Final    12/18/2013 08:55 Performed at [facility]   Final    >=100,000 COLONIES/ML Performed at [facility]    Culture   Final    KLEBSIELLA PNEUMONIAE Note: Confirmed Extended Spectrum Beta-Lactamase Producer (ESBL) CRITICAL RESULT CALLED TO, READ BACK BY AND VERIFIED WITH: JESSICA STEVENSON PA ON [id number] BY [place] Performed at [facility]    Report Status 12/21/2013 FINAL  Final   Organism ID, Bacteria KLEBSIELLA PNEUMONIAE  Final      Susceptibility     Klebsiella pneumoniae - MIC*    AMPICILLIN >=32 RESISTANT Resistant     CEFAZOLIN >=64 RESISTANT Resistant     CEFTRIAXONE >=64 RESISTANT Resistant     CIPROFLOXACIN >=4 RESISTANT Resistant     GENTAMICIN <=1 SENSITIVE Sensitive     LEVOFLOXACIN 4 INTERMEDIATE Intermediate     NITROFURANTOIN 32 SENSITIVE Sensitive     TOBRAMYCIN 8 INTERMEDIATE Intermediate     TRIMETH/SULFA >=320 RESISTANT Resistant     IMIPENEM <=0.25 SENSITIVE Sensitive     PIP/TAZO 8 SENSITIVE Sensitive     * KLEBSIELLA PNEUMONIAE  Blood culture (routine x 2)     Status: None (Preliminary result)   Collection Time: 12/18/13 12:55 AM  Result Value Ref Range Status   Specimen Description BLOOD RIGHT HAND  Final   Special Requests BOTTLES DRAWN AEROBIC ONLY [place]  Final   Culture  Setup Time   Final    12/18/2013 09:26 Performed at [facility]    Culture   Final           BLOOD CULTURE RECEIVED NO GROWTH TO DATE CULTURE WILL BE HELD FOR 5 DAYS BEFORE ISSUING A FINAL NEGATIVE REPORT Performed at [facility]    Report Status PENDING  Incomplete  Blood culture (routine x 2)     Status: None (Preliminary result)   Collection Time: 12/18/13  1:00 AM  Result Value Ref Range Status   Specimen Description BLOOD RIGHT ARM  Final   Special Requests BOTTLES DRAWN AEROBIC AND ANAEROBIC 10CC  Final   Culture  Setup Time   Final    12/18/2013 09:26 Performed at [facility]    Culture   Final           BLOOD CULTURE RECEIVED NO GROWTH TO DATE CULTURE WILL BE HELD FOR 5 DAYS BEFORE ISSUING A FINAL NEGATIVE REPORT Performed at [facility]    Report Status PENDING  Incomplete  Clostridium Difficile by PCR     Status: None   Collection Time: 12/18/13  2:31 AM  Result  Value Ref Range Status   C difficile by pcr NEGATIVE NEGATIVE Final  MRSA PCR Screening     Status: None   Collection Time: 12/18/13 11:52 AM  Result Value Ref Range Status   MRSA by PCR NEGATIVE NEGATIVE Final     Comment:        The GeneXpert MRSA Assay (FDA approved for NASAL specimens only), is one component of a comprehensive MRSA colonization surveillance program. It is not intended to diagnose MRSA infection nor to guide or monitor treatment for MRSA infections.     RADIOLOGY STUDIES/RESULTS: Ct Head Wo Contrast  12/18/2013   CLINICAL DATA:  Altered mental status. Nursing home patient, hypoglycemia and unresponsive.  EXAM: CT HEAD WITHOUT CONTRAST  TECHNIQUE: Contiguous axial images were obtained from the base of the skull through the vertex without intravenous contrast.  COMPARISON:  CT of the head February 14, 2012  FINDINGS: Severe hydrocephalus, with ballooning of the temporal horns, unchanged. Anterior recess of the third ventricle is 12 mm, unchanged. Similar periventricular white matter hypodensity. Patchy bilateral thalamus hypodensity is unchanged without mass effect, midline shift. High RIGHT frontal transcortical encephalomalacia. No acute large vascular territory infarct. No hemorrhage.  No abnormal extra-axial fluid collections. Basal cisterns are patent. Severe calcific atherosclerosis of the carotid bulbs.  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 No paranasal sinus air-fluid levels. LEFT middle ear and mastoid effusion, trace RIGHT mastoid effusion.  IMPRESSION: No acute intracranial process.  Stable severe hydrocephalus. Similar periventricular white matter hypodensities may reflect chronic small vessel ischemic disease and/or transependymal flow cerebral spinal fluid. RIGHT frontal encephalomalacia is likely posttraumatic. Nonspecific stable bilateral thalamic hypodensities.  LEFT middle ear and LEFT greater than RIGHT mastoid effusions.   Electronically Signed   By: Elon Alas   On: 12/18/2013 02:22   Mr Foot Right Wo Contrast  12/20/2013   CLINICAL DATA:  Osteomyelitis with wound drainage. Prior foot amputation 09/07/2013 at the proximal metatarsal  level.  EXAM: MRI OF THE RIGHT FOREFOOT WITHOUT CONTRAST  TECHNIQUE: Multiplanar, multisequence MR imaging was performed. No intravenous contrast was administered.  COMPARISON:  12/18/2013 radiographs  FINDINGS: Despite efforts by the technologist and patient, motion artifact is present on today's exam and could not be eliminated. This reduces exam sensitivity and specificity.  No definite osseous edema to suggest osteomyelitis. Abnormal thin soft tissues along the distal stump margin laterally particulate just anterior to the remaining fourth metatarsal dorsally as on image 7 of series 9 and image 16 of series 3. There may well be some soft tissue breakdown in this vicinity.  No drainable abscess observed. No current compelling findings of gas tracking in the soft tissues of the foot, although gas within the distal soft tissues was a concern on prior conventional radiographs. Some of this appearance was probably due to bandaging along the wound site.  IMPRESSION: 1. No findings of osteomyelitis or abscess, but the soft tissues laterally along the stump appear very thin overlying the distal fourth and fifth metatarsal amputation margins dorsally, possibly from ulceration or soft tissue breakdown.   Electronically Signed   By: Sherryl Barters M.D.   On: 12/20/2013 19:25   Dg Foot Complete Right  12/18/2013   CLINICAL DATA:  Wound drainage, RIGHT foot amputation 09/07/2013, history diabetes, hypertension, chronic renal failure  EXAM: RIGHT FOOT COMPLETE - 3+ VIEW  COMPARISON:  RIGHT foot radiographs and MR RIGHT foot 02/17/2012  FINDINGS: Interval transmetatarsal amputation.  Soft tissue swelling at distal foot stump.  Soft  tissue gas identified at the lateral aspect of the distal foot, distal to the fifth metatarsal.  No acute fracture or dislocation.  No definite bone destruction.  Scattered small vessel vascular calcification.  IMPRESSION: Interval transmetatarsal amputation.  Soft tissue gas at lateral aspect  of stump distal to the fifth metatarsal bone question related to ulcer or wound breakdown.  No definite bone destruction is seen to suggest osteomyelitis.  However if osteomyelitis remains a clinical concern, recommend MR imaging.   Electronically Signed   By: Lavonia Dana M.D.   On: 12/18/2013 17:07    Oren Binet, MD  [facility] Pager:[phone]  If 7PM-7AM, please contact night-coverage [email or web address] Password [email or web address] 12/22/2013, 10:34 AM   LOS: 5 days

## 2013-12-22 NOTE — Progress Notes (Signed)
Pt returned with PICC to right upper arm. Dressing transparent with gauze dry and intact. Pt denies pain or discomfort.

## 2013-12-22 NOTE — Progress Notes (Signed)
Called Radiology department spoke with Nira Conn to see how long it would take for pt to get PICC placement per MD order prior to discharge. Nira Conn stated that she would try to fit pt in early but it may not be until after lunch.  Awaiting PICC line placement at this time so that pt can be discharged to SNF.

## 2013-12-22 NOTE — Procedures (Signed)
RUE PICC 45 cm SVC RA No comp

## 2013-12-22 NOTE — Progress Notes (Signed)
Administered pain medication prior to wound change to right foot. Pt rated her pain level at 10 aching pain to her back and right foot.  Pt repositioned in bed and wound cleansed.  Dressing dry and intact at this time. Will continue to monitor.

## 2013-12-23 ENCOUNTER — Encounter: Payer: Self-pay | Admitting: Internal Medicine

## 2013-12-23 ENCOUNTER — Non-Acute Institutional Stay (SKILLED_NURSING_FACILITY): Payer: Medicare Other | Admitting: Internal Medicine

## 2013-12-23 DIAGNOSIS — M86671 Other chronic osteomyelitis, right ankle and foot: Secondary | ICD-10-CM

## 2013-12-23 DIAGNOSIS — E1151 Type 2 diabetes mellitus with diabetic peripheral angiopathy without gangrene: Secondary | ICD-10-CM

## 2013-12-23 DIAGNOSIS — N390 Urinary tract infection, site not specified: Secondary | ICD-10-CM | POA: Diagnosis not present

## 2013-12-23 DIAGNOSIS — B9689 Other specified bacterial agents as the cause of diseases classified elsewhere: Secondary | ICD-10-CM | POA: Diagnosis not present

## 2013-12-23 DIAGNOSIS — N183 Chronic kidney disease, stage 3 unspecified: Secondary | ICD-10-CM

## 2013-12-23 DIAGNOSIS — E1159 Type 2 diabetes mellitus with other circulatory complications: Secondary | ICD-10-CM | POA: Diagnosis not present

## 2013-12-23 DIAGNOSIS — R339 Retention of urine, unspecified: Secondary | ICD-10-CM

## 2013-12-23 DIAGNOSIS — I739 Peripheral vascular disease, unspecified: Secondary | ICD-10-CM

## 2013-12-23 DIAGNOSIS — E162 Hypoglycemia, unspecified: Secondary | ICD-10-CM | POA: Diagnosis not present

## 2013-12-23 NOTE — Progress Notes (Signed)
Patient ID: [name], female   DOB: January 29, 1941, 73 y.o.   MRN: [id number]  Provider:  Rexene Edison. Mariea Clonts, D.O., C.M.D. Location:  [facility]   PCP: Hollace Kinnier, DO  Code Status: full code  No Known Allergies  Chief Complaint  Patient presents with  . Readmit To SNF    HPI: 73 y.o. female with h/o DMII, htn, anemia, chronic kidney disease, blindness,  PVD right transmetatarsal amputation for diabetic ulcer with osteomyelitis of 3rd to 5th toes and metatarsal heads was readmitted here for continued long term care s/p hospitalization from 11/19-24 with acute encephalopathy (had been unresponsive here at snf).  She was hypoglycemic at 60, hypoxic at 85% despite her O2 via [place], and hypothermic to 96.5.  Her glucose improved en route with glucagon to 90.  Bear hugger was started in the ED.  Her mentation improved and when seen by hospitalist, she was AAOx3.  She had no urinary symptoms, but has a chronic foley for urinary retention.  Apparently this was removed at the hospital.  Foley was replaced at hospital due to urinary retention, but then failed voiding trial so sent here with foley again.  She had a positive UA and normal lactate of 0.86, normal cardiac enzymes, leukocytosis to 10.6, and normal CT brain.   Urine culture grew out klebsiella pneumonia with confirmed ESBL and invanz was started 11/20.   She is still completing this course.    When seen, she remained lethargic, was unable to say she felt any better.  She c/o her pain medication being ineffective for her legs and requested her eye drops be resumed.  She had been having increased difficulty with hypoglycemia, as well with poor po intake prehospitalization.    ROS: Review of Systems  Constitutional: Positive for malaise/fatigue. Negative for fever.       Poor appetite  HENT: Negative for congestion.   Eyes:       Blind  Respiratory: Negative for shortness of breath.   Cardiovascular: Negative for chest pain.    Gastrointestinal: Negative for abdominal pain, constipation, blood in stool and melena.  Genitourinary: Negative for dysuria, urgency, frequency and hematuria.       Foley for urinary retention  Musculoskeletal: Positive for myalgias.  Skin: Negative for rash.  Neurological: Positive for weakness. Negative for dizziness.  Endo/Heme/Allergies:       Hypoglycemia  Psychiatric/Behavioral: Positive for depression.     Past Medical History  Diagnosis Date  . Hypertension   . Diabetes mellitus   . Blindness   . TIA (transient ischemic attack)   . Anemia   . GERD (gastroesophageal reflux disease)   . Constipation   . Renal disorder 05/2012    acute Renal Failure   Past Surgical History  Procedure Laterality Date  . Gallbladder surgery    . Esophageal dilation    . Cholecystectomy    . Amputation  02/18/2012    Procedure: AMPUTATION RAY;  Surgeon: [name], MD;  Location: [facility];  Service: Orthopedics;  Laterality: Right;  right second toe  . Tee without cardioversion  02/21/2012    Procedure: TRANSESOPHAGEAL ECHOCARDIOGRAM (TEE);  Surgeon: [name], MD;  Location: [facility]COPY;  Service: Cardiovascular;  Laterality: N/A;   spoke with [name] from [place] pt will arrive by 815ish( thy change shifts at [place])  . Tee without cardioversion  02/25/2012    Procedure: TRANSESOPHAGEAL ECHOCARDIOGRAM (TEE);  Surgeon: [name], MD;  Location: [place];  Service:  Cardiovascular;  Laterality: N/A;  . Amputation Right 09/07/2013    Procedure: AMPUTATION RAY;  Surgeon: [name], MD;  Location: [facility];  Service: Orthopedics;  Laterality: Right;  Right Transmetatarsal Amputation   Social History:   reports that she quit smoking about 14 years ago. Her smokeless tobacco use includes Snuff. She reports that she does not drink alcohol or use illicit drugs.  No family history on file.  Medications: Patient's Medications  New Prescriptions   No medications on file  Previous  Medications   ACETAMINOPHEN (TYLENOL) 325 MG TABLET    Take 650 mg by mouth every 6 (six) hours as needed for fever.    ASPIRIN EC 325 MG TABLET    Take 325 mg by mouth daily.     BRINZOLAMIDE-BRIMONIDINE (SIMBRINZA) 1-0.2 % SUSP    Place 1 drop into the left eye 2 (two) times daily.   CARVEDILOL (COREG) 3.125 MG TABLET    Take 3.125 mg by mouth 2 (two) times daily with a meal.   COLLAGENASE (SANTYL) OINTMENT    Apply topically daily.   DOCUSATE SODIUM (COLACE) 100 MG CAPSULE    Take 100 mg by mouth 2 (two) times daily.   DULOXETINE (CYMBALTA) 60 MG CAPSULE    Take 1 capsule (60 mg total) by mouth daily. When completes 2 wks at 30mg    ERTAPENEM 1 G IN SODIUM CHLORIDE 0.9 % 50 ML    Inject 1 g into the vein daily. 2 weeks from 12/18/13   FUROSEMIDE (LASIX) 40 MG TABLET    Take 40 mg by mouth daily.   GABAPENTIN (NEURONTIN) 100 MG CAPSULE    Take 300 mg by mouth 2 (two) times daily. Phantom pain   HYDROCODONE-ACETAMINOPHEN (NORCO/VICODIN) 5-325 MG PER TABLET    Take one tablet by mouth every 6 hours scheduled for pain   HYDROCORTISONE (ANUSOL-HC) 2.5 % RECTAL CREAM    Place 1 application rectally 2 (two) times daily as needed for hemorrhoids.    INSULIN ASPART (NOVOLOG) 100 UNIT/ML INJECTION    0-9 Units, Subcutaneous, 3 times daily with meals,  CBG < 70: implement hypoglycemia protocol CBG 70 - 120: 0 units CBG 121 - 150: 1 unit CBG 151 - 200: 2 units CBG 201 - 250: 3 units CBG 251 - 300: 5 units CBG 301 - 350: 7 units CBG 351 - 400: 9 units CBG > 400: call MD   IPRATROPIUM-ALBUTEROL (DUONEB) 0.5-2.5 (3) MG/3ML SOLN    Take 3 mLs by nebulization every 6 (six) hours as needed (wheezing).   METOCLOPRAMIDE (REGLAN) 5 MG TABLET    Take 1 tablet (5 mg total) by mouth 4 (four) times daily -  before meals and at bedtime.   MULTIPLE VITAMINS-MINERALS (MULTIVITAMIN WITH MINERALS) TABLET    Take 1 tablet by mouth daily.   POLYETHYLENE GLYCOL (MIRALAX / GLYCOLAX) PACKET    Take 17 g by mouth daily.    PROMETHAZINE (PHENERGAN) 25 MG TABLET    Take 25 mg by mouth every 6 (six) hours as needed for nausea or vomiting.   PROTEIN SUPPLEMENT (RESOURCE BENEPROTEIN) POWD    Take 1 scoop by mouth 3 (three) times daily with meals.   SIMVASTATIN (ZOCOR) 20 MG TABLET    Take 20 mg by mouth daily.   SODIUM CHLORIDE (OCEAN) 0.65 % SOLN NASAL SPRAY    Place 1 spray into the nose 4 (four) times daily as needed for congestion.   SODIUM CHLORIDE 1 G TABLET  Take 2 g by mouth 3 (three) times daily.   TIMOLOL (TIMOPTIC) 0.5 % OPHTHALMIC SOLUTION    Place 1 drop into the left eye 2 (two) times daily.  Modified Medications   No medications on file  Discontinued Medications   No medications on file     Physical Exam: Filed Vitals:   12/23/13 1024  BP: 133/64  Pulse: 70  Temp: 98.2 F (36.8 C)  Resp: 16  Height: 5\' 2"  (1.575 m)  Weight: 199 lb (90.266 kg)  SpO2: 99%  Physical Exam  Constitutional:  Obese black female resting in bed; will open her eyes and answer questions briefly  HENT:  Head: Normocephalic and atraumatic.  Right Ear: External ear normal.  Left Ear: External ear normal.  Nose: Nose normal.  Mouth/Throat: Oropharynx is clear and moist.  Eyes: Conjunctivae are normal. Pupils are equal, round, and reactive to light.  Neck: Normal range of motion. Neck supple. No JVD present. No tracheal deviation present. No thyromegaly present.  Cardiovascular: Normal rate, regular rhythm and normal heart sounds.   Pulmonary/Chest: Effort normal and breath sounds normal. No respiratory distress. She has no wheezes. She has no rales.  Abdominal: Soft. Bowel sounds are normal. She exhibits no distension and no mass. There is no tenderness.  Musculoskeletal: She exhibits edema. She exhibits no tenderness.  Right transmetatarsal amputation (historical)  Neurological:  Is oriented to person, place, time, but lethargic, can follow commands  Skin: Skin is warm and dry.  Psychiatric: She has a normal  mood and affect.    Labs reviewed: Basic Metabolic Panel:  Recent Labs  09/07/13 1002 12/18/13 0013 12/19/13 0638  NA 137 137 139  K 5.2 3.8 4.2  CL 99 98 101  CO2 21 27 25   GLUCOSE 113* 134* 224*  BUN 43* 15 16  CREATININE 0.98 0.73 0.92  CALCIUM 8.8 8.7 8.4   Liver Function Tests:  Recent Labs  09/07/13 1002 12/18/13 0013 12/19/13 0638  AST 33 49* 23  ALT 17 17 13   ALKPHOS 91 217* 183*  BILITOT 0.3 0.4 0.2*  PROT 7.3 6.8 6.7  ALBUMIN 3.1* 2.3* 2.3*   CBC:  Recent Labs  09/07/13 1002 12/18/13 0013 12/19/13 0638  WBC 6.2 10.6* 8.3  NEUTROABS  --  9.3*  --   HGB 11.9* 11.2* 10.9*  HCT 38.9 36.1 35.2*  MCV 90.3 87.0 87.1  PLT 203 269 246   Cardiac Enzymes:  Recent Labs  12/18/13 0013  TROPONINI <0.30  CBG:  Recent Labs  12/22/13 0642 12/22/13 1137 12/22/13 1642  GLUCAP 165* 170* 172*   Imaging and Procedures: 12/18/13:  CT brain w/o contrast:  No acute intracranial process.  Stable severe hydrocephalus. Similar periventricular white matter hypodensities may reflect chronic small vessel ischemic disease and/or transependymal flow cerebral spinal fluid. RIGHT frontal encephalomalacia is likely posttraumatic. Nonspecific stable bilateral thalamic hypodensities.  LEFT middle ear and LEFT greater than RIGHT mastoid effusions.  12/18/13:  Right foot xrays:  Interval transmetatarsal amputation.Soft tissue gas at lateral aspect of stump distal to the fifth metatarsal bone question related to ulcer or wound breakdown.  No definite bone destruction is seen to suggest osteomyelitis. However if osteomyelitis remains a clinical concern, recommend MR imaging.  12/20/13:  MRI right foot:  No findings of osteomyelitis or abscess, but the soft tissues laterally along the stump appear very thin overlying the distal fourth and fifth metatarsal amputation margins dorsally, possibly from ulceration or soft tissue breakdown.  12/22/13:  Successful  right arm Power PICC  line placement with ultrasound and fluoroscopic guidance. The catheter is ready for use.  Assessment/Plan 1. Hypoglycemia -caused change in mentation that resulted in hospitalization and resolved by the time her admitting physician saw her -sent here on novolog meal coverage and additional meal coverage sliding scale--these are not recommended in long term care due to hypoglycemia so revised to only meal coverage novolog with long acting insulin--will monitor  2. Type 2 diabetes, controlled, with peripheral circulatory disorder -as above -had prior transmetatarsal amputation right foot  3. Urinary tract infection due to extended-spectrum beta lactamase (ESBL)-producing Klebsiella -suspect this was actually colonization and hypoglycemia was true cause of altered mentation  4. Chronic osteomyelitis of toe of right foot -s/p transmetatarsal amputation, has chronic pain in right leg  5. Chronic renal failure, stage 3 (moderate) -avoid nsaids and other nephrotoxic meds, f/u bmp when abx completed  6. PAD (peripheral artery disease) -cont to monitor and maintain frequent positional changes to prevent skin breakdown   7. Urinary retention -foley in place--unclear timeline of when ucx obtained vs. Foley replacement -will ask staff to replace clean foley when abx are complete -all cultures should be obtained from clean catheters only  Functional status: dependent in adls except feeding due to blindness and weakness  Family/ staff Communication: discussed with nursing staff  Labs/tests ordered:  Cbc, bmp when abx completed

## 2013-12-24 LAB — CULTURE, BLOOD (ROUTINE X 2)
CULTURE: NO GROWTH
Culture: NO GROWTH

## 2013-12-26 ENCOUNTER — Emergency Department (HOSPITAL_COMMUNITY): Payer: Medicare Other

## 2013-12-26 ENCOUNTER — Inpatient Hospital Stay (HOSPITAL_COMMUNITY)
Admission: EM | Admit: 2013-12-26 | Discharge: 2014-01-07 | DRG: 291 | Disposition: A | Discharge status: Skilled Nursing Facility | Payer: Medicare Other | Attending: Internal Medicine | Admitting: Internal Medicine

## 2013-12-26 ENCOUNTER — Encounter (HOSPITAL_COMMUNITY): Payer: Self-pay | Admitting: *Deleted

## 2013-12-26 DIAGNOSIS — I10 Essential (primary) hypertension: Secondary | ICD-10-CM | POA: Diagnosis present

## 2013-12-26 DIAGNOSIS — N189 Chronic kidney disease, unspecified: Secondary | ICD-10-CM | POA: Diagnosis not present

## 2013-12-26 DIAGNOSIS — E870 Hyperosmolality and hypernatremia: Secondary | ICD-10-CM | POA: Diagnosis present

## 2013-12-26 DIAGNOSIS — D649 Anemia, unspecified: Secondary | ICD-10-CM | POA: Diagnosis present

## 2013-12-26 DIAGNOSIS — H548 Legal blindness, as defined in USA: Secondary | ICD-10-CM | POA: Diagnosis present

## 2013-12-26 DIAGNOSIS — N183 Chronic kidney disease, stage 3 (moderate): Secondary | ICD-10-CM | POA: Diagnosis present

## 2013-12-26 DIAGNOSIS — F17220 Nicotine dependence, chewing tobacco, uncomplicated: Secondary | ICD-10-CM | POA: Diagnosis present

## 2013-12-26 DIAGNOSIS — Z8673 Personal history of transient ischemic attack (TIA), and cerebral infarction without residual deficits: Secondary | ICD-10-CM | POA: Diagnosis not present

## 2013-12-26 DIAGNOSIS — Z89431 Acquired absence of right foot: Secondary | ICD-10-CM

## 2013-12-26 DIAGNOSIS — Z794 Long term (current) use of insulin: Secondary | ICD-10-CM

## 2013-12-26 DIAGNOSIS — I959 Hypotension, unspecified: Secondary | ICD-10-CM | POA: Diagnosis present

## 2013-12-26 DIAGNOSIS — J690 Pneumonitis due to inhalation of food and vomit: Secondary | ICD-10-CM

## 2013-12-26 DIAGNOSIS — I509 Heart failure, unspecified: Secondary | ICD-10-CM

## 2013-12-26 DIAGNOSIS — I5043 Acute on chronic combined systolic (congestive) and diastolic (congestive) heart failure: Principal | ICD-10-CM | POA: Diagnosis present

## 2013-12-26 DIAGNOSIS — G9340 Encephalopathy, unspecified: Secondary | ICD-10-CM | POA: Diagnosis present

## 2013-12-26 DIAGNOSIS — E11319 Type 2 diabetes mellitus with unspecified diabetic retinopathy without macular edema: Secondary | ICD-10-CM | POA: Diagnosis present

## 2013-12-26 DIAGNOSIS — I5023 Acute on chronic systolic (congestive) heart failure: Secondary | ICD-10-CM

## 2013-12-26 DIAGNOSIS — Z6835 Body mass index (BMI) 35.0-35.9, adult: Secondary | ICD-10-CM | POA: Diagnosis not present

## 2013-12-26 DIAGNOSIS — D631 Anemia in chronic kidney disease: Secondary | ICD-10-CM

## 2013-12-26 DIAGNOSIS — K219 Gastro-esophageal reflux disease without esophagitis: Secondary | ICD-10-CM | POA: Diagnosis present

## 2013-12-26 DIAGNOSIS — T501X5A Adverse effect of loop [high-ceiling] diuretics, initial encounter: Secondary | ICD-10-CM | POA: Diagnosis present

## 2013-12-26 DIAGNOSIS — I129 Hypertensive chronic kidney disease with stage 1 through stage 4 chronic kidney disease, or unspecified chronic kidney disease: Secondary | ICD-10-CM | POA: Diagnosis present

## 2013-12-26 DIAGNOSIS — N390 Urinary tract infection, site not specified: Secondary | ICD-10-CM | POA: Diagnosis present

## 2013-12-26 DIAGNOSIS — B961 Klebsiella pneumoniae [K. pneumoniae] as the cause of diseases classified elsewhere: Secondary | ICD-10-CM | POA: Diagnosis present

## 2013-12-26 DIAGNOSIS — Z7982 Long term (current) use of aspirin: Secondary | ICD-10-CM | POA: Diagnosis not present

## 2013-12-26 DIAGNOSIS — Z7401 Bed confinement status: Secondary | ICD-10-CM

## 2013-12-26 DIAGNOSIS — I5020 Unspecified systolic (congestive) heart failure: Secondary | ICD-10-CM

## 2013-12-26 DIAGNOSIS — R0902 Hypoxemia: Secondary | ICD-10-CM | POA: Diagnosis not present

## 2013-12-26 DIAGNOSIS — J811 Chronic pulmonary edema: Secondary | ICD-10-CM

## 2013-12-26 DIAGNOSIS — E1151 Type 2 diabetes mellitus with diabetic peripheral angiopathy without gangrene: Secondary | ICD-10-CM | POA: Diagnosis present

## 2013-12-26 DIAGNOSIS — N179 Acute kidney failure, unspecified: Secondary | ICD-10-CM

## 2013-12-26 LAB — CBC
HCT: 35.7 % — ABNORMAL LOW (ref 36.0–46.0)
Hemoglobin: 10.8 g/dL — ABNORMAL LOW (ref 12.0–15.0)
MCH: 27.9 pg (ref 26.0–34.0)
MCHC: 30.3 g/dL (ref 30.0–36.0)
MCV: 92.2 fL (ref 78.0–100.0)
Platelets: 297 10*3/uL (ref 150–400)
RBC: 3.87 MIL/uL (ref 3.87–5.11)
RDW: 18.4 % — AB (ref 11.5–15.5)
WBC: 10.7 10*3/uL — ABNORMAL HIGH (ref 4.0–10.5)

## 2013-12-26 LAB — URINALYSIS, ROUTINE W REFLEX MICROSCOPIC
Glucose, UA: NEGATIVE mg/dL
Ketones, ur: NEGATIVE mg/dL
NITRITE: NEGATIVE
PH: 5 (ref 5.0–8.0)
Protein, ur: 30 mg/dL — AB
SPECIFIC GRAVITY, URINE: 1.024 (ref 1.005–1.030)
Urobilinogen, UA: 1 mg/dL (ref 0.0–1.0)

## 2013-12-26 LAB — URINE MICROSCOPIC-ADD ON

## 2013-12-26 LAB — PRO B NATRIURETIC PEPTIDE: Pro B Natriuretic peptide (BNP): 5360 pg/mL — ABNORMAL HIGH (ref 0–125)

## 2013-12-26 LAB — BLOOD GAS, ARTERIAL
Acid-Base Excess: 7.3 mmol/L — ABNORMAL HIGH (ref 0.0–2.0)
Bicarbonate: 33.4 mEq/L — ABNORMAL HIGH (ref 20.0–24.0)
Drawn by: 103701
FIO2: 0.21 %
O2 Saturation: 83.7 %
PATIENT TEMPERATURE: 98.6
PH ART: 7.38 (ref 7.350–7.450)
TCO2: 30.8 mmol/L (ref 0–100)
pCO2 arterial: 57.8 mmHg (ref 35.0–45.0)
pO2, Arterial: 49.8 mmHg — ABNORMAL LOW (ref 80.0–100.0)

## 2013-12-26 LAB — DIFFERENTIAL
BASOS ABS: 0 10*3/uL (ref 0.0–0.1)
BASOS PCT: 0 % (ref 0–1)
EOS ABS: 0.1 10*3/uL (ref 0.0–0.7)
Eosinophils Relative: 1 % (ref 0–5)
Lymphocytes Relative: 17 % (ref 12–46)
Lymphs Abs: 1.9 10*3/uL (ref 0.7–4.0)
MONO ABS: 0.6 10*3/uL (ref 0.1–1.0)
Monocytes Relative: 5 % (ref 3–12)
NEUTROS PCT: 77 % (ref 43–77)
Neutro Abs: 8.1 10*3/uL — ABNORMAL HIGH (ref 1.7–7.7)

## 2013-12-26 LAB — COMPREHENSIVE METABOLIC PANEL
ALBUMIN: 2.4 g/dL — AB (ref 3.5–5.2)
ALT: 7 U/L (ref 0–35)
AST: 15 U/L (ref 0–37)
Alkaline Phosphatase: 122 U/L — ABNORMAL HIGH (ref 39–117)
Anion gap: 12 (ref 5–15)
BILIRUBIN TOTAL: 0.2 mg/dL — AB (ref 0.3–1.2)
BUN: 25 mg/dL — ABNORMAL HIGH (ref 6–23)
CHLORIDE: 108 meq/L (ref 96–112)
CO2: 32 mEq/L (ref 19–32)
CREATININE: 1.01 mg/dL (ref 0.50–1.10)
Calcium: 9 mg/dL (ref 8.4–10.5)
GFR calc Af Amer: 63 mL/min — ABNORMAL LOW (ref >90)
GFR calc non Af Amer: 55 mL/min — ABNORMAL LOW (ref >90)
Glucose, Bld: 180 mg/dL — ABNORMAL HIGH (ref 70–99)
POTASSIUM: 4.7 meq/L (ref 3.7–5.3)
Sodium: 152 mEq/L — ABNORMAL HIGH (ref 137–147)
TOTAL PROTEIN: 7.1 g/dL (ref 6.0–8.3)

## 2013-12-26 LAB — CBG MONITORING, ED: Glucose-Capillary: 144 mg/dL — ABNORMAL HIGH (ref 70–99)

## 2013-12-26 MED ORDER — ACETAMINOPHEN 325 MG PO TABS: 650.0000 mg | ORAL_TABLET | ORAL | Status: DC | PRN

## 2013-12-26 MED ORDER — INSULIN ASPART 100 UNIT/ML ~~LOC~~ SOLN: 0.0000 [IU] | Freq: Every day | SUBCUTANEOUS | Status: DC

## 2013-12-26 MED ORDER — INSULIN ASPART 100 UNIT/ML ~~LOC~~ SOLN
0.0000 [IU] | Freq: Three times a day (TID) | SUBCUTANEOUS | Status: DC
  Administered 2013-12-27 – 2014-01-05 (×2): 2 [IU] via SUBCUTANEOUS

## 2013-12-26 MED ORDER — ASPIRIN EC 325 MG PO TBEC
325.0000 mg | DELAYED_RELEASE_TABLET | Freq: Every day | ORAL | Status: DC
  Administered 2013-12-27 – 2014-01-06 (×9): 325 mg via ORAL
  Filled 2013-12-26 (×12): qty 1

## 2013-12-26 MED ORDER — DEXTROSE-NACL 5-0.2 % IV SOLN
INTRAVENOUS | Status: DC
  Administered 2013-12-27: 01:00:00 via INTRAVENOUS

## 2013-12-26 MED ORDER — ONDANSETRON HCL 4 MG/2ML IJ SOLN: 4.0000 mg | Freq: Four times a day (QID) | INTRAMUSCULAR | Status: DC | PRN

## 2013-12-26 MED ORDER — IPRATROPIUM-ALBUTEROL 0.5-2.5 (3) MG/3ML IN SOLN: 3.0000 mL | Freq: Four times a day (QID) | RESPIRATORY_TRACT | Status: DC | PRN

## 2013-12-26 MED ORDER — TIMOLOL MALEATE 0.5 % OP SOLN
1.0000 [drp] | Freq: Two times a day (BID) | OPHTHALMIC | Status: DC
  Administered 2013-12-27 – 2014-01-07 (×24): 1 [drp] via OPHTHALMIC
  Filled 2013-12-26: qty 5

## 2013-12-26 MED ORDER — HYDROCODONE-ACETAMINOPHEN 5-325 MG PO TABS: 1.0000 | ORAL_TABLET | Freq: Four times a day (QID) | ORAL | Status: DC | PRN

## 2013-12-26 MED ORDER — IOHEXOL 300 MG/ML  SOLN
50.0000 mL | Freq: Once | INTRAMUSCULAR | Status: AC | PRN
  Administered 2013-12-26: 50 mL via ORAL

## 2013-12-26 MED ORDER — CARVEDILOL 3.125 MG PO TABS
3.1250 mg | ORAL_TABLET | Freq: Two times a day (BID) | ORAL | Status: DC
  Administered 2013-12-27: 3.125 mg via ORAL
  Filled 2013-12-26: qty 1

## 2013-12-26 MED ORDER — DEXTROSE 5 % IV SOLN: 1.0000 g | INTRAVENOUS | Status: DC

## 2013-12-26 MED ORDER — SODIUM CHLORIDE 0.9 % IJ SOLN
3.0000 mL | INTRAMUSCULAR | Status: DC | PRN
  Administered 2014-01-05: 3 mL via INTRAVENOUS
  Filled 2013-12-26: qty 3

## 2013-12-26 MED ORDER — SODIUM CHLORIDE 0.9 % IV SOLN
250.0000 mL | INTRAVENOUS | Status: DC | PRN
  Administered 2013-12-29: 250 mL via INTRAVENOUS

## 2013-12-26 MED ORDER — FUROSEMIDE 10 MG/ML IJ SOLN
40.0000 mg | Freq: Once | INTRAMUSCULAR | Status: AC
  Administered 2013-12-26: 40 mg via INTRAVENOUS
  Filled 2013-12-26: qty 4

## 2013-12-26 MED ORDER — ENOXAPARIN SODIUM 40 MG/0.4ML ~~LOC~~ SOLN
40.0000 mg | Freq: Every day | SUBCUTANEOUS | Status: DC
  Administered 2013-12-27 – 2013-12-29 (×4): 40 mg via SUBCUTANEOUS
  Filled 2013-12-26 (×4): qty 0.4

## 2013-12-26 MED ORDER — DEXTROSE 5 % IV SOLN
1.0000 g | Freq: Once | INTRAVENOUS | Status: AC
  Administered 2013-12-26 (×2): 1 g via INTRAVENOUS
  Filled 2013-12-26: qty 10

## 2013-12-26 MED ORDER — IOHEXOL 300 MG/ML  SOLN
100.0000 mL | Freq: Once | INTRAMUSCULAR | Status: AC | PRN
  Administered 2013-12-26: 100 mL via INTRAVENOUS

## 2013-12-26 MED ORDER — SIMVASTATIN 20 MG PO TABS
20.0000 mg | ORAL_TABLET | Freq: Every day | ORAL | Status: DC
  Administered 2013-12-27 – 2014-01-06 (×10): 20 mg via ORAL
  Filled 2013-12-26 (×9): qty 1
  Filled 2013-12-26: qty 2
  Filled 2013-12-26 (×2): qty 1

## 2013-12-26 MED ORDER — SODIUM CHLORIDE 0.9 % IJ SOLN
3.0000 mL | Freq: Two times a day (BID) | INTRAMUSCULAR | Status: DC
  Administered 2013-12-27 – 2014-01-05 (×2): 3 mL via INTRAVENOUS

## 2013-12-26 MED ORDER — METOCLOPRAMIDE HCL 5 MG PO TABS
5.0000 mg | ORAL_TABLET | Freq: Three times a day (TID) | ORAL | Status: DC
  Administered 2013-12-27 – 2014-01-06 (×36): 5 mg via ORAL
  Filled 2013-12-26 (×49): qty 1

## 2013-12-26 MED ORDER — DULOXETINE HCL 60 MG PO CPEP
60.0000 mg | ORAL_CAPSULE | Freq: Every day | ORAL | Status: DC
  Administered 2013-12-27 – 2013-12-30 (×3): 60 mg via ORAL
  Filled 2013-12-26: qty 1
  Filled 2013-12-26: qty 2
  Filled 2013-12-26 (×2): qty 1

## 2013-12-26 MED ORDER — GABAPENTIN 300 MG PO CAPS
300.0000 mg | ORAL_CAPSULE | Freq: Two times a day (BID) | ORAL | Status: DC
  Administered 2013-12-27 – 2014-01-01 (×10): 300 mg via ORAL
  Filled 2013-12-26 (×12): qty 1

## 2013-12-26 NOTE — ED Notes (Signed)
Bed: TG62 Expected date: 12/26/13 Expected time: 12:25 PM Means of arrival: Ambulance Comments: Low sats, lethargic

## 2013-12-26 NOTE — ED Provider Notes (Addendum)
This 73 year old female who was seen and assessed by Dr. Cathleen Fears and will [place]. She was noted to have decreased sats at the rest home where she lives today. Patient is unable to give me any additional history. She is oriented to person place but not time. Is on oxygen now and does not have shortness of breath currently. She also currently denies any pain. Workup prior prior to my evaluation revealed her to a 13 with elevated BNP and incus markings on chest x-Audiel Scheiber. She also had CT of the abdomen done that did not show any acute abnormalities. Results for orders placed or performed during the hospital encounter of 12/26/13  Urinalysis, Routine w reflex microscopic  Result Value Ref Range   Color, Urine AMBER (A) YELLOW   APPearance TURBID (A) CLEAR   Specific Gravity, Urine 1.024 1.005 - 1.030   pH 5.0 5.0 - 8.0   Glucose, UA NEGATIVE NEGATIVE mg/dL   Hgb urine dipstick LARGE (A) NEGATIVE   Bilirubin Urine SMALL (A) NEGATIVE   Ketones, ur NEGATIVE NEGATIVE mg/dL   Protein, ur 30 (A) NEGATIVE mg/dL   Urobilinogen, UA 1.0 0.0 - 1.0 mg/dL   Nitrite NEGATIVE NEGATIVE   Leukocytes, UA MODERATE (A) NEGATIVE  Blood gas, arterial  Result Value Ref Range   FIO2 0.21 %   Delivery systems ROOM AIR    pH, Arterial 7.380 7.350 - 7.450   pCO2 arterial 57.8 (HH) 35.0 - 45.0 mmHg   pO2, Arterial 49.8 (L) 80.0 - 100.0 mmHg   Bicarbonate 33.4 (H) 20.0 - 24.0 mEq/L   TCO2 30.8 0 - 100 mmol/L   Acid-Base Excess 7.3 (H) 0.0 - 2.0 mmol/L   O2 Saturation 83.7 %   Patient temperature 98.6    Collection site LEFT RADIAL    Drawn by [id number]    Sample type ARTERIAL    Allens test (pass/fail) PASS PASS  CBC  Result Value Ref Range   WBC 10.7 (H) 4.0 - 10.5 K/uL   RBC 3.87 3.87 - 5.11 MIL/uL   Hemoglobin 10.8 (L) 12.0 - 15.0 g/dL   HCT 35.7 (L) 36.0 - 46.0 %   MCV 92.2 78.0 - 100.0 fL   MCH 27.9 26.0 - 34.0 pg   MCHC 30.3 30.0 - 36.0 g/dL   RDW 18.4 (H) 11.5 - 15.5 %   Platelets 297 150 - 400 K/uL   Differential  Result Value Ref Range   Neutrophils Relative % 77 43 - 77 %   Neutro Abs 8.1 (H) 1.7 - 7.7 K/uL   Lymphocytes Relative 17 12 - 46 %   Lymphs Abs 1.9 0.7 - 4.0 K/uL   Monocytes Relative 5 3 - 12 %   Monocytes Absolute 0.6 0.1 - 1.0 K/uL   Eosinophils Relative 1 0 - 5 %   Eosinophils Absolute 0.1 0.0 - 0.7 K/uL   Basophils Relative 0 0 - 1 %   Basophils Absolute 0.0 0.0 - 0.1 K/uL  Comprehensive metabolic panel  Result Value Ref Range   Sodium 152 (H) 137 - 147 mEq/L   Potassium 4.7 3.7 - 5.3 mEq/L   Chloride 108 96 - 112 mEq/L   CO2 32 19 - 32 mEq/L   Glucose, Bld 180 (H) 70 - 99 mg/dL   BUN 25 (H) 6 - 23 mg/dL   Creatinine, Ser 1.01 0.50 - 1.10 mg/dL   Calcium 9.0 8.4 - 10.5 mg/dL   Total Protein 7.1 6.0 - 8.3 g/dL   Albumin 2.4 (L) 3.5 -  5.2 g/dL   AST 15 0 - 37 U/L   ALT 7 0 - 35 U/L   Alkaline Phosphatase 122 (H) 39 - 117 U/L   Total Bilirubin 0.2 (L) 0.3 - 1.2 mg/dL   GFR calc non Af Amer 55 (L) >90 mL/min   GFR calc Af Amer 63 (L) >90 mL/min   Anion gap 12 5 - 15  Pro b natriuretic peptide  Result Value Ref Range   Pro B Natriuretic peptide (BNP) 5360.0 (H) 0 - 125 pg/mL  Urine microscopic-add on  Result Value Ref Range   Squamous Epithelial / LPF FEW (A) RARE   WBC, UA TOO NUMEROUS TO COUNT <3 WBC/hpf   RBC / HPF 21-50 <3 RBC/hpf   Casts HYALINE CASTS (A) NEGATIVE   Urine-Other MUCOUS PRESENT   POC CBG, ED  Result Value Ref Range   Glucose-Capillary 144 (H) 70 - 99 mg/dL     1 hypoxia source likely mild congestive heart failure 2-UTI- IV Rocephin ordered. 3 hypernatremia 4- confusion-per report from assisted living facility this is baseline 5- s/p right foot amputation 6 blind   D.w. Dr.Doutova and she will admit.   Shaune Pollack, MD 12/26/13 3343  Shaune Pollack, MD 12/26/13 2136

## 2013-12-26 NOTE — ED Provider Notes (Signed)
Patient reportedly more sleepy since this morning. Patient presently complains of low abdominal pain to me and vomiting. Staff at nursing home reports she last vomited yesterday. On exam patient is chronically ill appearing, sleepy arousable to verbal stimulus. Lungs clear auscultation abdomen morbidly obese, nontender. Right lower extremity with amputation of distal foot with clean-appearing wound. All other extremities without contusion abrasion or tenderness neurovascular intact. Patient has PICC line in her right upper arm. Currently she is having brown Ardine Bjork, MD 12/26/13 1536

## 2013-12-26 NOTE — ED Notes (Signed)
Staff at [facility], at which pt. Resides, found pt. To be "more lethargic than usual".  They further told EMS pt. Has had this issue "for weeks; and has been seen at [facility]".  She arrives in no distress, and is initially non-verbal.  Staff at [facility] also informed paramedics that pt. Was found to have spo2 in the 95's today.

## 2013-12-26 NOTE — ED Notes (Signed)
Note:  She arrived here with a foley catheter, and a P.I.C.C. Line in right upper inner arm.

## 2013-12-26 NOTE — ED Notes (Signed)
We had much difficulty obtaining labs, but finally they are resulted.

## 2013-12-26 NOTE — ED Notes (Signed)
Note:  ABG has just been obtained (RM. AIR); and cbc recollected per lab request (clotted).

## 2013-12-26 NOTE — ED Provider Notes (Signed)
CSN: [id number]     Arrival date & time 12/26/13  1226 History   First MD Initiated Contact with Patient 12/26/13 1426     Chief Complaint  Patient presents with  . Altered Mental Status   Level V caveat. Altered Mental status. The patient's history was obtained from [place] from [facility], and from EMS.  [name] is a 73 y.o. female with a history of blindness, diabetes, TIA, GERD, renal failure and urinary retention who presents to the emergency room via EMS coming from [facility] facility who report she is "more lethargic than usual" since this morning.  Patient had a recent admission for UTI and was discharged 12/23/13. Patient reports that she "just don't feel good." Patient reports some low abdominal pain for "a while."  I spoke with the patient's nurse at [facility] who reports that the patient was more difficult to arouse this morning. She also reports her oxygen saturation was 85 and 88% on room air. The patient is not usually on oxygen at home. The patient was placed on 2 L nasal cannula and her oxygen saturation improved to 100%. The nurse reports that she vomited twice yesterday and staff was worried she had an impaction so they gave her laxatives yesterday. Patient had no vomiting today. The patient had normal bowel movements today. Patient arrived to the ED with a PICC line and a urinary catheter for urinary retention.  (Consider location/radiation/quality/duration/timing/severity/associated sxs/prior Treatment) HPI  Past Medical History  Diagnosis Date  . Hypertension   . Diabetes mellitus   . Blindness   . TIA (transient ischemic attack)   . Anemia   . GERD (gastroesophageal reflux disease)   . Constipation   . Renal disorder 05/2012    acute Renal Failure   Past Surgical History  Procedure Laterality Date  . Gallbladder surgery    . Esophageal dilation    . Cholecystectomy    . Amputation  02/18/2012    Procedure: AMPUTATION RAY;   Surgeon: [name], MD;  Location: [facility];  Service: Orthopedics;  Laterality: Right;  right second toe  . Tee without cardioversion  02/21/2012    Procedure: TRANSESOPHAGEAL ECHOCARDIOGRAM (TEE);  Surgeon: [name], MD;  Location: [facility] ENDOSCOPY;  Service: Cardiovascular;  Laterality: N/A;   spoke with [name] from [facility] pt will arrive by 815ish( thy change shifts at [facility])  . Tee without cardioversion  02/25/2012    Procedure: TRANSESOPHAGEAL ECHOCARDIOGRAM (TEE);  Surgeon: [name], MD;  Location: [facility];  Service: Cardiovascular;  Laterality: N/A;  . Amputation Right 09/07/2013    Procedure: AMPUTATION RAY;  Surgeon: [name], MD;  Location: [facility];  Service: Orthopedics;  Laterality: Right;  Right Transmetatarsal Amputation   No family history on file. History  Substance Use Topics  . Smoking status: Former Smoker -- 0 years    Quit date: 06/25/1999  . Smokeless tobacco: Current User    Types: Snuff  . Alcohol Use: No   OB History    No data available     Review of Systems  Unable to perform ROS Constitutional: Negative for fever.  HENT: Negative for congestion and sore throat.   Eyes: Negative for visual disturbance.  Respiratory: Negative for cough, shortness of breath and wheezing.   Cardiovascular: Negative for chest pain and palpitations.  Gastrointestinal: Positive for nausea, vomiting and abdominal pain. Negative for diarrhea.  Genitourinary: Negative for dysuria.  Musculoskeletal: Negative for back pain and  neck pain.  Skin: Negative for rash.  Neurological: Negative for headaches.      Allergies  Review of patient's allergies indicates no known allergies.  Home Medications   Prior to Admission medications   Medication Sig Start Date End Date Taking? Authorizing Provider  acetaminophen (TYLENOL) 325 MG tablet Take 650 mg by mouth every 6 (six) hours as needed for fever.    Yes Historical Provider, MD  aspirin EC 325 MG tablet Take 325 mg  by mouth daily.     Yes Historical Provider, MD  Brinzolamide-Brimonidine [facility]) 1-0.2 % SUSP Place 1 drop into the left eye 2 (two) times daily.   Yes Historical Provider, MD  carvedilol (COREG) 3.125 MG tablet Take 3.125 mg by mouth 2 (two) times daily with a meal.   Yes Historical Provider, MD  collagenase (SANTYL) ointment Apply topically daily. 12/21/13  Yes [name], MD  docusate sodium (COLACE) 100 MG capsule Take 100 mg by mouth 2 (two) times daily.   Yes Historical Provider, MD  DULoxetine (CYMBALTA) 60 MG capsule Take 1 capsule (60 mg total) by mouth daily. When completes 2 wks at 30mg  09/19/13  Yes [name], DO  ertapenem 1 g in sodium chloride 0.9 % 50 mL Inject 1 g into the vein daily. 2 weeks from 12/18/13 12/21/13  Yes [name], MD  furosemide (LASIX) 40 MG tablet Take 40 mg by mouth daily.   Yes Historical Provider, MD  gabapentin (NEURONTIN) 100 MG capsule Take 300 mg by mouth 2 (two) times daily. Phantom pain   Yes Historical Provider, MD  HYDROcodone-acetaminophen (NORCO/VICODIN) 5-325 MG per tablet Take one tablet by mouth every 6 hours scheduled for pain 12/21/13  Yes [name], MD  hydrocortisone (ANUSOL-HC) 2.5 % rectal cream Place 1 application rectally 2 (two) times daily as needed for hemorrhoids.    Yes Historical Provider, MD  insulin aspart (NOVOLOG) 100 UNIT/ML injection 0-9 Units, Subcutaneous, 3 times daily with meals,  CBG < 70: implement hypoglycemia protocol CBG 70 - 120: 0 units CBG 121 - 150: 1 unit CBG 151 - 200: 2 units CBG 201 - 250: 3 units CBG 251 - 300: 5 units CBG 301 - 350: 7 units CBG 351 - 400: 9 units CBG > 400: call MD 12/21/13  Yes [name], MD  ipratropium-albuterol (DUONEB) 0.5-2.5 (3) MG/3ML SOLN Take 3 mLs by nebulization every 6 (six) hours as needed (wheezing).   Yes Historical Provider, MD  metoCLOPramide (REGLAN) 5 MG tablet Take 1 tablet (5 mg total) by mouth 4 (four) times daily -  before  meals and at bedtime. 12/22/13  Yes [name], MD  Multiple Vitamins-Minerals (MULTIVITAMIN WITH MINERALS) tablet Take 1 tablet by mouth daily.   Yes Historical Provider, MD  polyethylene glycol (MIRALAX / GLYCOLAX) packet Take 17 g by mouth daily. 06/18/12  Yes [name], MD  promethazine (PHENERGAN) 25 MG tablet Take 25 mg by mouth every 6 (six) hours as needed for nausea or vomiting.   Yes Historical Provider, MD  protein supplement (RESOURCE BENEPROTEIN) POWD Take 1 scoop by mouth 3 (three) times daily with meals.   Yes Historical Provider, MD  simvastatin (ZOCOR) 20 MG tablet Take 20 mg by mouth daily.   Yes Historical Provider, MD  sodium chloride (OCEAN) 0.65 % SOLN nasal spray Place 1 spray into the nose 4 (four) times daily as needed for congestion. 06/29/11  Yes Bynum Bellows, MD  sodium chloride  1 G tablet Take 2 g by mouth 3 (three) times daily.   Yes Historical Provider, MD  timolol (TIMOPTIC) 0.5 % ophthalmic solution Place 1 drop into the left eye 2 (two) times daily.   Yes Historical Provider, MD   BP 130/100 mmHg  Pulse 66  Resp 12  SpO2 100% Physical Exam  Constitutional: She appears well-developed and well-nourished. No distress.  Obese female.   HENT:  Head: Normocephalic and atraumatic.  Right Ear: External ear normal.  Left Ear: External ear normal.  Nose: Nose normal.  Mouth/Throat: Oropharynx is clear and moist. No oropharyngeal exudate.  Bilateral tympanic membranes are pearly-gray without erythema or loss of landmarks.   Eyes: Conjunctivae are normal. Right eye exhibits no discharge. Left eye exhibits no discharge.  Patient is legally blind.   Neck: Neck supple.  Cardiovascular: Normal rate, regular rhythm, normal heart sounds and intact distal pulses.  Exam reveals no gallop and no friction rub.   No murmur heard. Pulmonary/Chest: Effort normal and breath sounds normal. No respiratory distress. She has no wheezes. She has no rales.  Abdominal: Soft.  Bowel sounds are normal. She exhibits no distension and no mass. There is tenderness. There is no rebound and no guarding.  Patient's abdomen is soft. Patient has active bowel sounds. Patient has mild suprapubic tenderness.   Musculoskeletal:  Patient's left leg was wrapped in gauze and pad. The patient previously had her toes removed.  The foot looks clean and does not appear infected. There is no lower extremity edema.   Lymphadenopathy:    She has no cervical adenopathy.  Neurological: She is alert.  Patient is oriented to person and event only.   Skin: Skin is warm and dry. No rash noted. She is not diaphoretic. No erythema. No pallor.  Psychiatric: She has a normal mood and affect. Her speech is normal.  Patient's speech is clear and she is able to answer questions appropriately.   Nursing note and vitals reviewed.   ED Course  Procedures (including critical care time) Labs Review Labs Reviewed  CBG MONITORING, ED - Abnormal; Notable for the following:    Glucose-Capillary 144 (*)    All other components within normal limits  CBC WITH DIFFERENTIAL  COMPREHENSIVE METABOLIC PANEL  URINALYSIS, ROUTINE W REFLEX MICROSCOPIC  BLOOD GAS, ARTERIAL  CBC  DIFFERENTIAL    Imaging Review Dg Chest 2 View  12/26/2013   CLINICAL DATA:  Altered mental status, shortness of breath, weakness  EXAM: CHEST  2 VIEW  COMPARISON:  12/22/2013  FINDINGS: Cardiomegaly is noted. Mild interstitial prominence bilaterally suspicious for mild interstitial edema. No segmental infiltrate. Stable right arm PICC line position with tip in right atrium.  IMPRESSION: Cardiomegaly. Mild interstitial prominence bilaterally suspicious for mild interstitial edema. No segmental infiltrate   Electronically Signed   By: Lahoma Crocker M.D.   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 female with a history of blindness, diabetes, TIA, GERD, renal failure and urinary retention who presents to the emergency room via EMS coming from [facility] living facility who report she is "more lethargic than usual" since this morning.  4:30 pm The patient's ABG on room air indicates some chronic hypoxemia. Will place on 2 L oxygen via nasal cannula. The patient oxygen saturation is around 92% on room air currently.   5:02 PM patient is handed off at shift change to Dr. Pattricia Boss. Lab work and CT abd still pending.   The patient was discussed with and evaluated by Dr. Winfred Leeds who agrees with plan.     Hanley Hays, PA-C 12/26/13 [place], MD 12/26/13 [phone]

## 2013-12-26 NOTE — ED Notes (Signed)
She answers questions appropriately for our P.A., who is interviewing her as I write this.  She is oriented to her situation and is oriented to all except place/day/date/time.  She is able to correctly identify our current president.

## 2013-12-26 NOTE — ED Notes (Signed)
Note:  She is unable to drink her CT contrast.

## 2013-12-26 NOTE — H&P (Signed)
PCP: Hollace Kinnier, DO    Chief Complaint:  Confusion, hypoxia HPI: [name] is a 73 y.o. female   has a past medical history of Hypertension; Diabetes mellitus; Blindness; TIA (transient ischemic attack); Anemia; GERD (gastroesophageal reflux disease); Constipation; and Renal disorder (05/2012).   Presented with  [facility] and confusion CXR showed mild CHF Na was noted to be up to 152. Patient is legally blind. Patient states she is thirsty and been asking for water but sometimes no one brings it to her. Patient denies any shortness of breath currently. Initially on arrival patient was noted to be hypoxic with ABG showing PO2 49.8 although pulse ox read at 92%. Also patient initially was noted to be hypotensive with blood pressure 8760 but currently has resolved latest blood pressure 141/119. She was found to have sodium of 152. UA was noted to have evidence of urinary tract infection, she has recently been admitted with Klebsiella pneumoniae UTI and finished a course of ertapenem  Hospitalist was called for admission for  Hypernatremia, mild CHF  Review of Systems:    Pertinent positives include:  Confusion.  Diarrhea,   Constitutional:  No weight loss, night sweats, Fevers, chills, fatigue, weight loss  HEENT:  No headaches, Difficulty swallowing,Tooth/dental problems,Sore throat,  No sneezing, itching, ear ache, nasal congestion, post nasal drip,  Cardio-vascular:  No chest pain, Orthopnea, PND, anasarca, dizziness, palpitations.no Bilateral lower extremity swelling  GI:  No heartburn, indigestion, abdominal pain, nausea, vomiting, change in bowel habits, loss of appetite, melena, blood in stool, hematemesis Resp:  no shortness of breath at rest. No dyspnea on exertion, No excess mucus, no productive cough, No non-productive cough, No coughing up of blood.No change in color of mucus.No wheezing. Skin:  no rash or lesions. No  jaundice GU:  no dysuria, change in color of urine, no urgency or frequency. No straining to urinate.  No flank pain.  Musculoskeletal:  No joint pain or no joint swelling. No decreased range of motion. No back pain.  Psych:  No change in mood or affect. No depression or anxiety. No memory loss.  Neuro: no localizing neurological complaints, no tingling, no weakness, no double vision, no gait abnormality, no slurred speech, no confusion  Otherwise ROS are negative except for above, 10 systems were reviewed  Past Medical History: Past Medical History  Diagnosis Date  . Hypertension   . Diabetes mellitus   . Blindness   . TIA (transient ischemic attack)   . Anemia   . GERD (gastroesophageal reflux disease)   . Constipation   . Renal disorder 05/2012    acute Renal Failure   Past Surgical History  Procedure Laterality Date  . Gallbladder surgery    . Esophageal dilation    . Cholecystectomy    . Amputation  02/18/2012    Procedure: AMPUTATION RAY;  Surgeon: [name], MD;  Location: [facility];  Service: Orthopedics;  Laterality: Right;  right second toe  . Tee without cardioversion  02/21/2012    Procedure: TRANSESOPHAGEAL ECHOCARDIOGRAM (TEE);  Surgeon: [name], MD;  Location: [facility] ENDOSCOPY;  Service: Cardiovascular;  Laterality: N/A;   spoke with [name] from [place] pt will arrive by 815ish( thy change shifts at [place])  . Tee without cardioversion  02/25/2012    Procedure: TRANSESOPHAGEAL ECHOCARDIOGRAM (TEE);  Surgeon: [name], MD;  Location: [place];  Service: Cardiovascular;  Laterality: N/A;  . Amputation Right 09/07/2013  Procedure: AMPUTATION RAY;  Surgeon: [name], MD;  Location: [place];  Service: Orthopedics;  Laterality: Right;  Right Transmetatarsal Amputation     Medications: Prior to Admission medications   Medication Sig Start Date End Date Taking? Authorizing Provider  acetaminophen (TYLENOL) 325 MG tablet Take 650 mg by mouth every 6 (six)  hours as needed for fever.    Yes Historical Provider, MD  aspirin EC 325 MG tablet Take 325 mg by mouth daily.     Yes Historical Provider, MD  Brinzolamide-Brimonidine [facility]) 1-0.2 % SUSP Place 1 drop into the left eye 2 (two) times daily.   Yes Historical Provider, MD  carvedilol (COREG) 3.125 MG tablet Take 3.125 mg by mouth 2 (two) times daily with a meal.   Yes Historical Provider, MD  collagenase (SANTYL) ointment Apply topically daily. 12/21/13  Yes [name], MD  docusate sodium (COLACE) 100 MG capsule Take 100 mg by mouth 2 (two) times daily.   Yes Historical Provider, MD  DULoxetine (CYMBALTA) 60 MG capsule Take 1 capsule (60 mg total) by mouth daily. When completes 2 wks at 30mg  09/19/13  Yes [name], DO  ertapenem 1 g in sodium chloride 0.9 % 50 mL Inject 1 g into the vein daily. 2 weeks from 12/18/13 12/21/13  Yes [name], MD  furosemide (LASIX) 40 MG tablet Take 40 mg by mouth daily.   Yes Historical Provider, MD  gabapentin (NEURONTIN) 100 MG capsule Take 300 mg by mouth 2 (two) times daily. Phantom pain   Yes Historical Provider, MD  HYDROcodone-acetaminophen (NORCO/VICODIN) 5-325 MG per tablet Take one tablet by mouth every 6 hours scheduled for pain 12/21/13  Yes [name], MD  hydrocortisone (ANUSOL-HC) 2.5 % rectal cream Place 1 application rectally 2 (two) times daily as needed for hemorrhoids.    Yes Historical Provider, MD  insulin aspart (NOVOLOG) 100 UNIT/ML injection 0-9 Units, Subcutaneous, 3 times daily with meals,  CBG < 70: implement hypoglycemia protocol CBG 70 - 120: 0 units CBG 121 - 150: 1 unit CBG 151 - 200: 2 units CBG 201 - 250: 3 units CBG 251 - 300: 5 units CBG 301 - 350: 7 units CBG 351 - 400: 9 units CBG > 400: call MD 12/21/13  Yes [name], MD  ipratropium-albuterol (DUONEB) 0.5-2.5 (3) MG/3ML SOLN Take 3 mLs by nebulization every 6 (six) hours as needed (wheezing).   Yes Historical Provider, MD    metoCLOPramide (REGLAN) 5 MG tablet Take 1 tablet (5 mg total) by mouth 4 (four) times daily -  before meals and at bedtime. 12/22/13  Yes [name], MD  Multiple Vitamins-Minerals (MULTIVITAMIN WITH MINERALS) tablet Take 1 tablet by mouth daily.   Yes Historical Provider, MD  polyethylene glycol (MIRALAX / GLYCOLAX) packet Take 17 g by mouth daily. 06/18/12  Yes [name], MD  promethazine (PHENERGAN) 25 MG tablet Take 25 mg by mouth every 6 (six) hours as needed for nausea or vomiting.   Yes Historical Provider, MD  protein supplement (RESOURCE BENEPROTEIN) POWD Take 1 scoop by mouth 3 (three) times daily with meals.   Yes Historical Provider, MD  simvastatin (ZOCOR) 20 MG tablet Take 20 mg by mouth daily.   Yes Historical Provider, MD  sodium chloride (OCEAN) 0.65 % SOLN nasal spray Place 1 spray into the nose 4 (four) times daily as needed for congestion. 06/29/11  Yes Bynum Bellows, MD  sodium chloride 1 G tablet  Take 2 g by mouth 3 (three) times daily.   Yes Historical Provider, MD  timolol (TIMOPTIC) 0.5 % ophthalmic solution Place 1 drop into the left eye 2 (two) times daily.   Yes Historical Provider, MD    Allergies:  No Known Allergies  Social History:  Ambulatory   bed bound From facility [facility]   reports that she quit smoking about 14 years ago. Her smokeless tobacco use includes Snuff. She reports that she does not drink alcohol or use illicit drugs.    Family History: family history includes Heart failure in her mother.    Physical Exam: Patient Vitals for the past 24 hrs:  BP Temp Temp src Pulse Resp SpO2  12/26/13 2100 142/61 mmHg - - 65 18 100 %  12/26/13 2030 129/84 mmHg - - 66 10 100 %  12/26/13 2000 120/61 mmHg - - 65 (!) 9 100 %  12/26/13 1930 (!) 144/52 mmHg - - 65 (!) 8 100 %  12/26/13 1839 (!) 102/50 mmHg 98.4 F (36.9 C) Rectal 65 (!) 9 100 %  12/26/13 1830 (!) 87/60 mmHg - - - 12 -  12/26/13 1800 (!) 138/44 mmHg - - - 16 -  12/26/13  1730 (!) 123/53 mmHg - - 66 (!) 7 100 %  12/26/13 1715 (!) 129/42 mmHg - - - - -  12/26/13 1637 - 98.2 F (36.8 C) Rectal - - -  12/26/13 1634 (!) 113/48 mmHg - - 67 14 100 %  12/26/13 1632 (!) 95/33 mmHg - - 67 12 92 %  12/26/13 1430 130/100 mmHg - - 66 12 100 %  12/26/13 1235 (!) 103/52 mmHg - - 69 11 98 %    1. General:  in No Acute distress 2. Psychological: Alert and Oriented 3. Head/ENT:   Moist  Mucous Membranes                          Head Non traumatic, neck supple                          Normal   Dentition 4. SKIN: normal  Skin turgor,  Skin clean Dry and intact no rash 5. Heart: Regular rate and rhythm no Murmur, Rub or gallop 6. Lungs: no wheezes minimal crackles   7. Abdomen: Soft, non-tender, slightly distended 8. Lower extremities: no clubbing, cyanosis, or edema 9. Neurologically Grossly intact, moving all 4 extremities equally 10. MSK: Normal range of motion  body mass index is unknown because there is no weight on file.   Labs on Admission:   Results for orders placed or performed during the hospital encounter of 12/26/13 (from the past 24 hour(s))  POC CBG, ED     Status: Abnormal   Collection Time: 12/26/13  1:22 PM  Result Value Ref Range   Glucose-Capillary 144 (H) 70 - 99 mg/dL  Urinalysis, Routine w reflex microscopic     Status: Abnormal   Collection Time: 12/26/13  2:41 PM  Result Value Ref Range   Color, Urine AMBER (A) YELLOW   APPearance TURBID (A) CLEAR   Specific Gravity, Urine 1.024 1.005 - 1.030   pH 5.0 5.0 - 8.0   Glucose, UA NEGATIVE NEGATIVE mg/dL   Hgb urine dipstick LARGE (A) NEGATIVE   Bilirubin Urine SMALL (A) NEGATIVE   Ketones, ur NEGATIVE NEGATIVE mg/dL   Protein, ur 30 (A) NEGATIVE mg/dL   Urobilinogen, UA 1.0  0.0 - 1.0 mg/dL   Nitrite NEGATIVE NEGATIVE   Leukocytes, UA MODERATE (A) NEGATIVE  Urine microscopic-add on     Status: Abnormal   Collection Time: 12/26/13  2:41 PM  Result Value Ref Range   Squamous Epithelial  / LPF FEW (A) RARE   WBC, UA TOO NUMEROUS TO COUNT <3 WBC/hpf   RBC / HPF 21-50 <3 RBC/hpf   Casts HYALINE CASTS (A) NEGATIVE   Urine-Other MUCOUS PRESENT   Blood gas, arterial     Status: Abnormal   Collection Time: 12/26/13  4:20 PM  Result Value Ref Range   FIO2 0.21 %   Delivery systems ROOM AIR    pH, Arterial 7.380 7.350 - 7.450   pCO2 arterial 57.8 (HH) 35.0 - 45.0 mmHg   pO2, Arterial 49.8 (L) 80.0 - 100.0 mmHg   Bicarbonate 33.4 (H) 20.0 - 24.0 mEq/L   TCO2 30.8 0 - 100 mmol/L   Acid-Base Excess 7.3 (H) 0.0 - 2.0 mmol/L   O2 Saturation 83.7 %   Patient temperature 98.6    Collection site LEFT RADIAL    Drawn by [id number]    Sample type ARTERIAL    Allens test (pass/fail) PASS PASS  CBC     Status: Abnormal   Collection Time: 12/26/13  4:20 PM  Result Value Ref Range   WBC 10.7 (H) 4.0 - 10.5 K/uL   RBC 3.87 3.87 - 5.11 MIL/uL   Hemoglobin 10.8 (L) 12.0 - 15.0 g/dL   HCT 35.7 (L) 36.0 - 46.0 %   MCV 92.2 78.0 - 100.0 fL   MCH 27.9 26.0 - 34.0 pg   MCHC 30.3 30.0 - 36.0 g/dL   RDW 18.4 (H) 11.5 - 15.5 %   Platelets 297 150 - 400 K/uL  Differential     Status: Abnormal   Collection Time: 12/26/13  4:20 PM  Result Value Ref Range   Neutrophils Relative % 77 43 - 77 %   Neutro Abs 8.1 (H) 1.7 - 7.7 K/uL   Lymphocytes Relative 17 12 - 46 %   Lymphs Abs 1.9 0.7 - 4.0 K/uL   Monocytes Relative 5 3 - 12 %   Monocytes Absolute 0.6 0.1 - 1.0 K/uL   Eosinophils Relative 1 0 - 5 %   Eosinophils Absolute 0.1 0.0 - 0.7 K/uL   Basophils Relative 0 0 - 1 %   Basophils Absolute 0.0 0.0 - 0.1 K/uL  Comprehensive metabolic panel     Status: Abnormal   Collection Time: 12/26/13  4:25 PM  Result Value Ref Range   Sodium 152 (H) 137 - 147 mEq/L   Potassium 4.7 3.7 - 5.3 mEq/L   Chloride 108 96 - 112 mEq/L   CO2 32 19 - 32 mEq/L   Glucose, Bld 180 (H) 70 - 99 mg/dL   BUN 25 (H) 6 - 23 mg/dL   Creatinine, Ser 1.01 0.50 - 1.10 mg/dL   Calcium 9.0 8.4 - 10.5 mg/dL   Total Protein  7.1 6.0 - 8.3 g/dL   Albumin 2.4 (L) 3.5 - 5.2 g/dL   AST 15 0 - 37 U/L   ALT 7 0 - 35 U/L   Alkaline Phosphatase 122 (H) 39 - 117 U/L   Total Bilirubin 0.2 (L) 0.3 - 1.2 mg/dL   GFR calc non Af Amer 55 (L) >90 mL/min   GFR calc Af Amer 63 (L) >90 mL/min   Anion gap 12 5 - 15  Pro b natriuretic peptide  Status: Abnormal   Collection Time: 12/26/13  4:40 PM  Result Value Ref Range   Pro B Natriuretic peptide (BNP) 5360.0 (H) 0 - 125 pg/mL    UA evidence of UTI  Lab Results  Component Value Date   HGBA1C 6.2* 12/18/2013    Estimated Creatinine Clearance: 53.4 mL/min (by C-G formula based on Cr of 1.01).  BNP (last 3 results)  Recent Labs  12/26/13 1640  PROBNP 5360.0*    Other results:  I have pearsonaly reviewed this: ECG REPORT  Rate: 68  Rhythm: NSR ST&T Change: no ischemia   There were no vitals filed for this visit.   Cultures:    Component Value Date/Time   SDES BLOOD RIGHT ARM 12/18/2013 0100   SPECREQUEST BOTTLES DRAWN AEROBIC AND ANAEROBIC 10CC 12/18/2013 0100   CULT  12/18/2013 0100    NO GROWTH 5 DAYS Performed at [place] 12/24/2013 FINAL 12/18/2013 0100     Radiological Exams on Admission: Dg Chest 2 View  12/26/2013   CLINICAL DATA:  Altered mental status, shortness of breath, weakness  EXAM: CHEST  2 VIEW  COMPARISON:  12/22/2013  FINDINGS: Cardiomegaly is noted. Mild interstitial prominence bilaterally suspicious for mild interstitial edema. No segmental infiltrate. Stable right arm PICC line position with tip in right atrium.  IMPRESSION: Cardiomegaly. Mild interstitial prominence bilaterally suspicious for mild interstitial edema. No segmental infiltrate   Electronically Signed   By: Lahoma Crocker M.D.   On: 12/26/2013 16:06   Ct Abdomen Pelvis W Contrast  12/26/2013   CLINICAL DATA:  Increased lethargy.  Hypoxia today.  EXAM: CT ABDOMEN AND PELVIS WITH CONTRAST  TECHNIQUE: Multidetector CT imaging of the abdomen  and pelvis was performed using the standard protocol following bolus administration of intravenous contrast.  CONTRAST:  39mL OMNIPAQUE IOHEXOL 300 MG/ML SOLN, 128mL OMNIPAQUE IOHEXOL 300 MG/ML SOLN  COMPARISON:  06/18/2012.  FINDINGS: Small bilateral pleural effusions. Mild right lower lobe cylindrical bronchiectasis. Mild bibasilar atelectasis and possible linear scarring. Dense atheromatous coronary artery calcifications.  Unremarkable liver, spleen, pancreas and adrenal glands. Lumbar and thoracic spine degenerative changes and scoliosis.  Cholecystectomy clips. Bilateral renal vascular calcifications. There is also a 5 mm calculus in the lower pole of the right kidney. No bladder or ureteral calculi and no hydronephrosis. A Foley catheter is present in the urinary bladder.  Uterine fibroids. No gastrointestinal abnormalities or enlarged lymph nodes. Normal appearing appendix. Normal appearing left ovary. The right ovary is not clearly visualized.  IMPRESSION: 1. No acute abnormality. 2. Bilateral vascular renal calcifications and at least 1 nonobstructing right renal calculus. 3. Small bilateral pleural effusions. 4. Mild right lower lobe cylindrical bronchiectasis. 5. Mild bibasilar atelectasis. 6. Dense coronary artery atheromatous calcifications.   Electronically Signed   By: Enrique Sack M.D.   On: 12/26/2013 20:13    Chart has been reviewed  Assessment/Plan  73 year old female with history of diabetes type 2, systolic heart failure with EF 40%, blindness, hypertension, currently resides at [facility] presents with hypoxia was found to have urinary tract infection and hypernatremia with mild CHF  Present on Admission:  . Systolic and diastolic CHF, acute on chronic - this is mild given the patient reports poor by mouth intake and evidence of hypernatremia avoid overdiuresis. Patient was already given Lasix in emergency department. We'll continue to monitor , given transient hypotension and  hypoxia and hypernatremia monitor in step down  . Anemia chronic continue to monitor  . Essential  hypertension continue Coreg holding parameters  . Type 2 diabetes, controlled, with peripheral circulatory disorder - continue sliding scale  . UTI (lower urinary tract infection) - will need to resend urine culture. Cover for now with Rocephin await results of culture to adjust father given recent history of an resistant Klebsiella that she just finished antibiotics for  . Hypernatremia  - patient has poor access to drinking water given blindness and inability to provide herself with adequate by mouth intake. Will give gentle D5 quarter normal saline and monitor closely  . Hypoxic - transient in the setting of mild CHF. Suspect ABG possibly venous   Prophylaxis:   Lovenox, Protonix  CODE STATUS:  FULL CODE    Other plan as per orders.  I have spent a total of 55 min on this admission  Hajime Asfaw 12/26/2013, 10:06 PM  [facility]  Pager [phone]   after 2 AM please page floor coverage PA If 7AM-7PM, please contact the day team taking care of the patient  [email or web address]  Password [email or web address]

## 2013-12-26 NOTE — ED Notes (Signed)
Pt is lethargic at this time however will respond to voice and touch.

## 2013-12-27 ENCOUNTER — Other Ambulatory Visit: Payer: Self-pay

## 2013-12-27 DIAGNOSIS — I059 Rheumatic mitral valve disease, unspecified: Secondary | ICD-10-CM

## 2013-12-27 DIAGNOSIS — I10 Essential (primary) hypertension: Secondary | ICD-10-CM

## 2013-12-27 DIAGNOSIS — R0902 Hypoxemia: Secondary | ICD-10-CM

## 2013-12-27 LAB — CBC WITH DIFFERENTIAL/PLATELET
BASOS PCT: 0 % (ref 0–1)
Basophils Absolute: 0 10*3/uL (ref 0.0–0.1)
Eosinophils Absolute: 0.6 10*3/uL (ref 0.0–0.7)
Eosinophils Relative: 7 % — ABNORMAL HIGH (ref 0–5)
HEMATOCRIT: 32 % — AB (ref 36.0–46.0)
Hemoglobin: 10.1 g/dL — ABNORMAL LOW (ref 12.0–15.0)
LYMPHS ABS: 1.9 10*3/uL (ref 0.7–4.0)
LYMPHS PCT: 20 % (ref 12–46)
MCH: 28.5 pg (ref 26.0–34.0)
MCHC: 31.6 g/dL (ref 30.0–36.0)
MCV: 90.1 fL (ref 78.0–100.0)
MONOS PCT: 6 % (ref 3–12)
Monocytes Absolute: 0.6 10*3/uL (ref 0.1–1.0)
NEUTROS PCT: 67 % (ref 43–77)
Neutro Abs: 6.4 10*3/uL (ref 1.7–7.7)
Platelets: 275 10*3/uL (ref 150–400)
RBC: 3.55 MIL/uL — AB (ref 3.87–5.11)
RDW: 18.5 % — ABNORMAL HIGH (ref 11.5–15.5)
WBC: 9.5 10*3/uL (ref 4.0–10.5)

## 2013-12-27 LAB — BASIC METABOLIC PANEL
ANION GAP: 10 (ref 5–15)
BUN: 23 mg/dL (ref 6–23)
CO2: 32 meq/L (ref 19–32)
CREATININE: 0.99 mg/dL (ref 0.50–1.10)
Calcium: 8.8 mg/dL (ref 8.4–10.5)
Chloride: 105 mEq/L (ref 96–112)
GFR calc Af Amer: 65 mL/min — ABNORMAL LOW (ref >90)
GFR calc non Af Amer: 56 mL/min — ABNORMAL LOW (ref >90)
Glucose, Bld: 153 mg/dL — ABNORMAL HIGH (ref 70–99)
Potassium: 4.1 mEq/L (ref 3.7–5.3)
Sodium: 147 mEq/L (ref 137–147)

## 2013-12-27 LAB — TROPONIN I
Troponin I: <0.3 ng/mL (ref <0.30)
Troponin I: <0.3 ng/mL (ref <0.30)

## 2013-12-27 LAB — GLUCOSE, CAPILLARY
GLUCOSE-CAPILLARY: 93 mg/dL (ref 70–99)
Glucose-Capillary: 128 mg/dL — ABNORMAL HIGH (ref 70–99)
Glucose-Capillary: 112 mg/dL — ABNORMAL HIGH (ref 70–99)
Glucose-Capillary: 103 mg/dL — ABNORMAL HIGH (ref 70–99)

## 2013-12-27 LAB — CBC
HEMATOCRIT: 33.2 % — AB (ref 36.0–46.0)
Hemoglobin: 10.3 g/dL — ABNORMAL LOW (ref 12.0–15.0)
MCH: 27.9 pg (ref 26.0–34.0)
MCHC: 31 g/dL (ref 30.0–36.0)
MCV: 90 fL (ref 78.0–100.0)
Platelets: 290 10*3/uL (ref 150–400)
RBC: 3.69 MIL/uL — ABNORMAL LOW (ref 3.87–5.11)
RDW: 18.4 % — AB (ref 11.5–15.5)
WBC: 9.1 10*3/uL (ref 4.0–10.5)

## 2013-12-27 LAB — COMPREHENSIVE METABOLIC PANEL
ALK PHOS: 110 U/L (ref 39–117)
ALT: 7 U/L (ref 0–35)
AST: 15 U/L (ref 0–37)
Albumin: 2.3 g/dL — ABNORMAL LOW (ref 3.5–5.2)
Anion gap: 8 (ref 5–15)
BILIRUBIN TOTAL: 0.3 mg/dL (ref 0.3–1.2)
BUN: 22 mg/dL (ref 6–23)
CHLORIDE: 102 meq/L (ref 96–112)
CO2: 32 meq/L (ref 19–32)
Calcium: 8.6 mg/dL (ref 8.4–10.5)
Creatinine, Ser: 0.97 mg/dL (ref 0.50–1.10)
GFR calc non Af Amer: 57 mL/min — ABNORMAL LOW (ref >90)
GFR, EST AFRICAN AMERICAN: 67 mL/min — AB (ref >90)
GLUCOSE: 169 mg/dL — AB (ref 70–99)
POTASSIUM: 3.9 meq/L (ref 3.7–5.3)
Sodium: 142 mEq/L (ref 137–147)
Total Protein: 6.7 g/dL (ref 6.0–8.3)

## 2013-12-27 LAB — PRO B NATRIURETIC PEPTIDE: Pro B Natriuretic peptide (BNP): 4645 pg/mL — ABNORMAL HIGH (ref 0–125)

## 2013-12-27 LAB — MRSA PCR SCREENING: MRSA by PCR: NEGATIVE

## 2013-12-27 MED ORDER — SODIUM CHLORIDE 0.9 % IJ SOLN
10.0000 mL | INTRAMUSCULAR | Status: DC | PRN
  Administered 2013-12-28 – 2014-01-07 (×9): 10 mL
  Filled 2013-12-27 (×9): qty 40

## 2013-12-27 MED ORDER — FUROSEMIDE 40 MG PO TABS
40.0000 mg | ORAL_TABLET | Freq: Every day | ORAL | Status: DC
  Administered 2013-12-27 – 2013-12-28 (×2): 40 mg via ORAL
  Filled 2013-12-27 (×2): qty 1

## 2013-12-27 MED ORDER — SODIUM CHLORIDE 0.9 % IV SOLN
1.0000 g | Freq: Every day | INTRAVENOUS | Status: DC
  Administered 2013-12-27 – 2013-12-29 (×3): 1 g via INTRAVENOUS
  Filled 2013-12-27 (×4): qty 1

## 2013-12-27 NOTE — Plan of Care (Signed)
Problem: Phase I Progression Outcomes Goal: Pain controlled with appropriate interventions Outcome: Progressing Goal: Voiding-avoid urinary catheter unless indicated Outcome: Not Progressing Chronic urinary catheter. Goal: Hemodynamically stable Outcome: Progressing  Problem: Phase II Progression Outcomes Goal: Vital signs remain stable Outcome: Completed/Met Date Met:  12/27/13

## 2013-12-27 NOTE — Progress Notes (Addendum)
  Echocardiogram 2D Echocardiogram has been performed.  Lysle Rubens 12/27/2013, 11:29 AM

## 2013-12-27 NOTE — Progress Notes (Addendum)
PR interval .456 per EKG.  Notified Dr. Karleen Hampshire via text page.

## 2013-12-27 NOTE — Progress Notes (Signed)
Hand off report called to Sonia Baller, [place].  Transferring to room 1420 via hospital bed.

## 2013-12-27 NOTE — Progress Notes (Signed)
Enquiry left on answering machine at [facility] concerning status of influenza and pneumonia vaccines.

## 2013-12-27 NOTE — Progress Notes (Signed)
[facility] PROGRESS NOTE  [name] FMB:[id number] DOB: Nov 10, 1940 DOA: 12/26/2013 PCP: Hollace Kinnier, DO Interim summary:  73 [facility]  [place] in with mild hypoxia and confusion , CXR showed mild CHF and labs revealed hypernatremia. UA was noted to have evidence of urinary tract infection, she has recently been admitted with Klebsiella pneumoniae UTI and finished a course of ertapenem. She was admitted to hospitalist service for management of CHF and hypernatremia and possible UTI.  On arrival to ED, she was found to be hypotensive and initially admitted to step down for closer monitoring.  Assessment/Plan: 1. 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 She will be started on oral lasix today and monitored.  I/O last 3 completed shifts: In: 204.2 [I.V.:204.2] Out: 475 [Urine:475]   she is currently requiring 2 lit [place] oxygen.  Resume coreg.    Status post transmetatarsal amputation of right foot;  Wound care to be consulted.   Hypotension; resolved   Urinary retention: Chronic foley catheter in place  Diabetes Mellitus: Resume SSI. Last hgba1c is 6.2  Stage 3 CKD  Stable.    Hypernatremia:  Resolved.    Anemia: Baseline around 10 and stable.   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      Code Status: full code.  Family Communication: none at bedside.  Disposition Plan: transfer to telemetry   Consultants:  none  Procedures:  none  Antibiotics:  invanz   Rocephin one dose  HPI/Subjective: Having trouble feeding herself.   Objective: Filed Vitals:   12/27/13 0800  BP: 153/75  Pulse: 66  Temp:   Resp: 15    Intake/Output Summary (Last 24 hours) at 12/27/13 0920 Last data filed at 12/27/13 0500  Gross per 24 hour  Intake 204.17 ml  Output    475  ml  Net -270.83 ml   Filed Weights   12/26/13 2300 12/27/13 0400  Weight: 91 kg (200 lb 9.9 oz) 91 kg (200 lb 9.9 oz)    Exam:   General:  Alert afebrile comfortable  Cardiovascular: s1s2  Respiratory: diminished air entry at bases.   Abdomen: soft non tender non distended bowel sounds heard  Musculoskeletal: pedal edema.   Data Reviewed: Basic Metabolic Panel:  Recent Labs Lab 12/26/13 1625 12/27/13 0040 12/27/13 0514  NA 152* 147 142  K 4.7 4.1 3.9  CL 108 105 102  CO2 32 32 32  GLUCOSE 180* 153* 169*  BUN 25* 23 22  CREATININE 1.01 0.99 0.97  CALCIUM 9.0 8.8 8.6   Liver Function Tests:  Recent Labs Lab 12/26/13 1625 12/27/13 0514  AST 15 15  ALT 7 7  ALKPHOS 122* 110  BILITOT 0.2* 0.3  PROT 7.1 6.7  ALBUMIN 2.4* 2.3*   No results for input(s): LIPASE, AMYLASE in the last 168 hours. No results for input(s): AMMONIA in the last 168 hours. CBC:  Recent Labs Lab 12/26/13 1620 12/27/13 0040 12/27/13 0514  WBC 10.7* 9.1 9.5  NEUTROABS 8.1*  --  6.4  HGB 10.8* 10.3* 10.1*  HCT 35.7* 33.2* 32.0*  MCV 92.2 90.0 90.1  PLT 297 290 275   Cardiac Enzymes:  Recent Labs Lab 12/27/13 0040 12/27/13 0514  TROPONINI <0.30 <0.30   BNP (last 3 results)  Recent Labs  12/26/13 1640 12/27/13 0040  PROBNP  5360.0* 4645.0*   CBG:  Recent Labs Lab 12/21/13 2147 12/22/13 0642 12/22/13 1137 12/22/13 1642 12/26/13 1322  GLUCAP 193* 165* 170* 172* 144*    Recent Results (from the past 240 hour(s))  Urine culture     Status: None   Collection Time: 12/18/13 12:52 AM  Result Value Ref Range Status   Specimen Description URINE, CATHETERIZED  Final   Special Requests NONE  Final   Culture  Setup Time   Final    12/18/2013 08:55 Performed at [facility]   Final    >=100,000 COLONIES/ML Performed at [facility]    Culture   Final    KLEBSIELLA PNEUMONIAE Note: Confirmed Extended Spectrum Beta-Lactamase  Producer (ESBL) CRITICAL RESULT CALLED TO, READ BACK BY AND VERIFIED WITH: JESSICA STEVENSON PA ON [id number] BY [place] Performed at [facility]    Report Status 12/21/2013 FINAL  Final   Organism ID, Bacteria KLEBSIELLA PNEUMONIAE  Final      Susceptibility   Klebsiella pneumoniae - MIC*    AMPICILLIN >=32 RESISTANT Resistant     CEFAZOLIN >=64 RESISTANT Resistant     CEFTRIAXONE >=64 RESISTANT Resistant     CIPROFLOXACIN >=4 RESISTANT Resistant     GENTAMICIN <=1 SENSITIVE Sensitive     LEVOFLOXACIN 4 INTERMEDIATE Intermediate     NITROFURANTOIN 32 SENSITIVE Sensitive     TOBRAMYCIN 8 INTERMEDIATE Intermediate     TRIMETH/SULFA >=320 RESISTANT Resistant     IMIPENEM <=0.25 SENSITIVE Sensitive     PIP/TAZO 8 SENSITIVE Sensitive     * KLEBSIELLA PNEUMONIAE  Blood culture (routine x 2)     Status: None   Collection Time: 12/18/13 12:55 AM  Result Value Ref Range Status   Specimen Description BLOOD RIGHT HAND  Final   Special Requests BOTTLES DRAWN AEROBIC ONLY 8CC  Final   Culture  Setup Time   Final    12/18/2013 09:26 Performed at [place]   Final    NO GROWTH 5 DAYS Performed at [facility]    Report Status 12/24/2013 FINAL  Final  Blood culture (routine x 2)     Status: None   Collection Time: 12/18/13  1:00 AM  Result Value Ref Range Status   Specimen Description BLOOD RIGHT ARM  Final   Special Requests BOTTLES DRAWN AEROBIC AND ANAEROBIC 10CC  Final   Culture  Setup Time   Final    12/18/2013 09:26 Performed at [place]   Final    NO GROWTH 5 DAYS Performed at [facility]    Report Status 12/24/2013 FINAL  Final  Clostridium Difficile by PCR     Status: None   Collection Time: 12/18/13  2:31 AM  Result Value Ref Range Status   C difficile by pcr NEGATIVE NEGATIVE Final  MRSA PCR Screening     Status: None   Collection Time: 12/18/13 11:52 AM  Result Value Ref Range Status   MRSA by PCR  NEGATIVE NEGATIVE Final    Comment:        The GeneXpert MRSA Assay (FDA approved for NASAL specimens only), is one component of a comprehensive MRSA colonization surveillance program. It is not intended to diagnose MRSA infection nor to guide or monitor treatment for MRSA infections.   MRSA PCR Screening     Status: None   Collection Time: 12/26/13 11:20 PM  Result Value Ref Range Status  MRSA by PCR NEGATIVE NEGATIVE Final    Comment:        The GeneXpert MRSA Assay (FDA approved for NASAL specimens only), is one component of a comprehensive MRSA colonization surveillance program. It is not intended to diagnose MRSA infection nor to guide or monitor treatment for MRSA infections.      Studies: Dg Chest 2 View  12/26/2013   CLINICAL DATA:  Altered mental status, shortness of breath, weakness  EXAM: CHEST  2 VIEW  COMPARISON:  12/22/2013  FINDINGS: Cardiomegaly is noted. Mild interstitial prominence bilaterally suspicious for mild interstitial edema. No segmental infiltrate. Stable right arm PICC line position with tip in right atrium.  IMPRESSION: Cardiomegaly. Mild interstitial prominence bilaterally suspicious for mild interstitial edema. No segmental infiltrate   Electronically Signed   By: Lahoma Crocker M.D.   On: 12/26/2013 16:06   Ct Abdomen Pelvis W Contrast  12/26/2013   CLINICAL DATA:  Increased lethargy.  Hypoxia today.  EXAM: CT ABDOMEN AND PELVIS WITH CONTRAST  TECHNIQUE: Multidetector CT imaging of the abdomen and pelvis was performed using the standard protocol following bolus administration of intravenous contrast.  CONTRAST:  64mL OMNIPAQUE IOHEXOL 300 MG/ML SOLN, 149mL OMNIPAQUE IOHEXOL 300 MG/ML SOLN  COMPARISON:  06/18/2012.  FINDINGS: Small bilateral pleural effusions. Mild right lower lobe cylindrical bronchiectasis. Mild bibasilar atelectasis and possible linear scarring. Dense atheromatous coronary artery calcifications.  Unremarkable liver, spleen, pancreas  and adrenal glands. Lumbar and thoracic spine degenerative changes and scoliosis.  Cholecystectomy clips. Bilateral renal vascular calcifications. There is also a 5 mm calculus in the lower pole of the right kidney. No bladder or ureteral calculi and no hydronephrosis. A Foley catheter is present in the urinary bladder.  Uterine fibroids. No gastrointestinal abnormalities or enlarged lymph nodes. Normal appearing appendix. Normal appearing left ovary. The right ovary is not clearly visualized.  IMPRESSION: 1. No acute abnormality. 2. Bilateral vascular renal calcifications and at least 1 nonobstructing right renal calculus. 3. Small bilateral pleural effusions. 4. Mild right lower lobe cylindrical bronchiectasis. 5. Mild bibasilar atelectasis. 6. Dense coronary artery atheromatous calcifications.   Electronically Signed   By: Enrique Sack M.D.   On: 12/26/2013 20:13    Scheduled Meds: . aspirin EC  325 mg Oral Daily  . carvedilol  3.125 mg Oral BID WC  . cefTRIAXone (ROCEPHIN)  IV  1 g Intravenous Q24H  . DULoxetine  60 mg Oral Daily  . enoxaparin (LOVENOX) injection  40 mg Subcutaneous QHS  . gabapentin  300 mg Oral BID  . insulin aspart  0-15 Units Subcutaneous TID WC  . insulin aspart  0-5 Units Subcutaneous QHS  . metoCLOPramide  5 mg Oral TID AC & HS  . simvastatin  20 mg Oral Daily  . sodium chloride  3 mL Intravenous Q12H  . timolol  1 drop Left Eye BID   Continuous Infusions:   Active Problems:   Type 2 diabetes, controlled, with peripheral circulatory disorder   Essential hypertension   Anemia   UTI (lower urinary tract infection)   Hypernatremia   Hypoxic   CHF (congestive heart failure)   Systolic and diastolic CHF, acute on chronic    Time spent: 25  minutes    [facility] Pager [phone]. If 7PM-7AM, please contact night-coverage at [email or web address], password [facility] 12/27/2013, 9:20 AM  LOS: 1 day

## 2013-12-28 ENCOUNTER — Other Ambulatory Visit: Payer: Self-pay

## 2013-12-28 ENCOUNTER — Inpatient Hospital Stay (HOSPITAL_COMMUNITY): Payer: Medicare Other

## 2013-12-28 DIAGNOSIS — E1159 Type 2 diabetes mellitus with other circulatory complications: Secondary | ICD-10-CM

## 2013-12-28 LAB — COMPREHENSIVE METABOLIC PANEL
ALBUMIN: 2.3 g/dL — AB (ref 3.5–5.2)
ALK PHOS: 119 U/L — AB (ref 39–117)
ALT: 8 U/L (ref 0–35)
AST: 22 U/L (ref 0–37)
Anion gap: 11 (ref 5–15)
BUN: 23 mg/dL (ref 6–23)
CHLORIDE: 101 meq/L (ref 96–112)
CO2: 29 mEq/L (ref 19–32)
Calcium: 8.5 mg/dL (ref 8.4–10.5)
Creatinine, Ser: 1.53 mg/dL — ABNORMAL HIGH (ref 0.50–1.10)
GFR calc Af Amer: 38 mL/min — ABNORMAL LOW (ref >90)
GFR calc non Af Amer: 33 mL/min — ABNORMAL LOW (ref >90)
Glucose, Bld: 101 mg/dL — ABNORMAL HIGH (ref 70–99)
POTASSIUM: 4.4 meq/L (ref 3.7–5.3)
Sodium: 141 mEq/L (ref 137–147)
Total Bilirubin: 0.3 mg/dL (ref 0.3–1.2)
Total Protein: 6.7 g/dL (ref 6.0–8.3)

## 2013-12-28 LAB — BLOOD GAS, ARTERIAL
ACID-BASE EXCESS: 4.7 mmol/L — AB (ref 0.0–2.0)
Bicarbonate: 30.3 mEq/L — ABNORMAL HIGH (ref 20.0–24.0)
Drawn by: 295031
O2 Content: 2 L/min
O2 Saturation: 98.4 %
PATIENT TEMPERATURE: 98.6
TCO2: 27.4 mmol/L (ref 0–100)
pCO2 arterial: 51.8 mmHg — ABNORMAL HIGH (ref 35.0–45.0)
pH, Arterial: 7.385 (ref 7.350–7.450)
pO2, Arterial: 140 mmHg — ABNORMAL HIGH (ref 80.0–100.0)

## 2013-12-28 LAB — GLUCOSE, CAPILLARY: Glucose-Capillary: 120 mg/dL — ABNORMAL HIGH (ref 70–99)

## 2013-12-28 LAB — BASIC METABOLIC PANEL
Anion gap: 11 (ref 5–15)
BUN: 22 mg/dL (ref 6–23)
CO2: 30 meq/L (ref 19–32)
CREATININE: 1.19 mg/dL — AB (ref 0.50–1.10)
Calcium: 8.6 mg/dL (ref 8.4–10.5)
Chloride: 99 mEq/L (ref 96–112)
GFR calc Af Amer: 52 mL/min — ABNORMAL LOW (ref >90)
GFR calc non Af Amer: 45 mL/min — ABNORMAL LOW (ref >90)
GLUCOSE: 137 mg/dL — AB (ref 70–99)
POTASSIUM: 4.7 meq/L (ref 3.7–5.3)
Sodium: 140 mEq/L (ref 137–147)

## 2013-12-28 LAB — AMMONIA: Ammonia: 26 umol/L (ref 11–60)

## 2013-12-28 MED ORDER — DEXTROSE-NACL 5-0.9 % IV SOLN
INTRAVENOUS | Status: DC
  Administered 2013-12-28 – 2013-12-29 (×2): via INTRAVENOUS

## 2013-12-28 MED ORDER — CARVEDILOL 3.125 MG PO TABS
3.1250 mg | ORAL_TABLET | Freq: Two times a day (BID) | ORAL | Status: DC
  Administered 2013-12-29 – 2014-01-06 (×16): 3.125 mg via ORAL
  Filled 2013-12-28 (×16): qty 1

## 2013-12-28 MED ORDER — SODIUM CHLORIDE 0.9 % IV BOLUS (SEPSIS)
500.0000 mL | Freq: Once | INTRAVENOUS | Status: AC
Start: 2013-12-28 — End: 2013-12-28
  Administered 2013-12-28: 500 mL via INTRAVENOUS

## 2013-12-28 MED ORDER — DEXTROSE-NACL 5-0.9 % IV SOLN
INTRAVENOUS | Status: DC
  Administered 2013-12-28: 14:00:00 via INTRAVENOUS

## 2013-12-28 NOTE — Plan of Care (Signed)
Problem: Phase I Progression Outcomes Goal: Pain controlled with appropriate interventions Outcome: Completed/Met Date Met:  12/28/13 Goal: Other Phase I Outcomes/Goals Outcome: Not Applicable Date Met:  64/86/16

## 2013-12-28 NOTE — Progress Notes (Signed)
[facility] PROGRESS NOTE  [name] WER:[id number] DOB: 01-30-1940 DOA: 12/26/2013 PCP: Hollace Kinnier, DO Interim summary:  73 [facility]  [place] in with mild hypoxia and confusion , CXR showed mild CHF and labs revealed hypernatremia. UA was noted to have evidence of urinary tract infection, she has recently been admitted with Klebsiella pneumoniae UTI and finished a course of ertapenem. She was admitted to hospitalist service for management of CHF and hypernatremia and possible UTI.  On arrival to ED, she was found to be hypotensive and initially admitted to step down for closer monitoring.  Assessment/Plan: 1. 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 She will be started on oral lasix today and monitored.  I/O last 3 completed shifts: In: 1114.2 [P.O.:640; I.V.:424.2; IV Piggyback:50] Out: 750 [Urine:750] Total I/O In: -  Out: 150 [Urine:150] she is currently requiring 2 lit [place] oxygen.   Resume coreg.    Status post transmetatarsal amputation of right foot;  Wound care to be consulted.   Hypotension; resolved   Urinary retention: Chronic foley catheter in place  Diabetes Mellitus: Resume SSI. Last hgba1c is 6.2  Stage 3 CKD  Slight worsening of the renal function. Continue to monitor.    Hypernatremia:  Resolved.    Anemia: Baseline around 10 and stable.   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   Acute encephalopathy Unclear etiology. Abg shows pco2 of 51 and Po2 of 140. Ct head without contrast was ordered.  She is very lethargic. She is made NPO  And was started on iv fluids.         Code Status: full code.  Family Communication: none at bedside.  Disposition Plan: transferred to  telemetry   Consultants:  none  Procedures:  none  Antibiotics:  invanz   Rocephin one dose  HPI/Subjective: Having trouble feeding herself.   Objective: Filed Vitals:   12/28/13 0349  BP: 142/70  Pulse: 64  Temp: 98.9 F (37.2 C)  Resp: 16    Intake/Output Summary (Last 24 hours) at 12/28/13 1553 Last data filed at 12/28/13 1433  Gross per 24 hour  Intake    280 ml  Output    300 ml  Net    -20 ml   Filed Weights   12/26/13 2300 12/27/13 0400 12/28/13 0349  Weight: 91 kg (200 lb 9.9 oz) 91 kg (200 lb 9.9 oz) 91.3 kg (201 lb 4.5 oz)    Exam:   General:  Alert afebrile comfortable  Cardiovascular: s1s2  Respiratory: diminished air entry at bases.   Abdomen: soft non tender non distended bowel sounds heard  Musculoskeletal: pedal edema.   Data Reviewed: Basic Metabolic Panel:  Recent Labs Lab 12/26/13 1625 12/27/13 0040 12/27/13 0514 12/28/13 0445  NA 152* 147 142 140  K 4.7 4.1 3.9 4.7  CL 108 105 102 99  CO2 32 32 32 30  GLUCOSE 180* 153* 169* 137*  BUN 25* 23 22 22   CREATININE 1.01 0.99 0.97 1.19*  CALCIUM 9.0 8.8 8.6 8.6   Liver Function Tests:  Recent Labs Lab 12/26/13 1625 12/27/13 0514  AST 15 15  ALT 7 7  ALKPHOS 122* 110  BILITOT 0.2* 0.3  PROT 7.1 6.7  ALBUMIN 2.4* 2.3*   No results for input(s): LIPASE, AMYLASE in the last 168 hours.  No results for input(s): AMMONIA in the last 168 hours. CBC:  Recent Labs Lab 12/26/13 1620 12/27/13 0040 12/27/13 0514  WBC 10.7* 9.1 9.5  NEUTROABS 8.1*  --  6.4  HGB 10.8* 10.3* 10.1*  HCT 35.7* 33.2* 32.0*  MCV 92.2 90.0 90.1  PLT 297 290 275   Cardiac Enzymes:  Recent Labs Lab 12/27/13 0040 12/27/13 0514 12/27/13 1055  TROPONINI <0.30 <0.30 <0.30   BNP (last 3 results)  Recent Labs  12/26/13 1640 12/27/13 0040  PROBNP 5360.0* 4645.0*   CBG:  Recent Labs Lab 12/27/13 0826 12/27/13 1229 12/27/13 1713 12/27/13 2158 12/28/13 0748  GLUCAP 128* 112*  103* 93 120*    Recent Results (from the past 240 hour(s))  MRSA PCR Screening     Status: None   Collection Time: 12/26/13 11:20 PM  Result Value Ref Range Status   MRSA by PCR NEGATIVE NEGATIVE Final    Comment:        The GeneXpert MRSA Assay (FDA approved for NASAL specimens only), is one component of a comprehensive MRSA colonization surveillance program. It is not intended to diagnose MRSA infection nor to guide or monitor treatment for MRSA infections.      Studies: Ct Abdomen Pelvis W Contrast  12/26/2013   CLINICAL DATA:  Increased lethargy.  Hypoxia today.  EXAM: CT ABDOMEN AND PELVIS WITH CONTRAST  TECHNIQUE: Multidetector CT imaging of the abdomen and pelvis was performed using the standard protocol following bolus administration of intravenous contrast.  CONTRAST:  86mL OMNIPAQUE IOHEXOL 300 MG/ML SOLN, 154mL OMNIPAQUE IOHEXOL 300 MG/ML SOLN  COMPARISON:  06/18/2012.  FINDINGS: Small bilateral pleural effusions. Mild right lower lobe cylindrical bronchiectasis. Mild bibasilar atelectasis and possible linear scarring. Dense atheromatous coronary artery calcifications.  Unremarkable liver, spleen, pancreas and adrenal glands. Lumbar and thoracic spine degenerative changes and scoliosis.  Cholecystectomy clips. Bilateral renal vascular calcifications. There is also a 5 mm calculus in the lower pole of the right kidney. No bladder or ureteral calculi and no hydronephrosis. A Foley catheter is present in the urinary bladder.  Uterine fibroids. No gastrointestinal abnormalities or enlarged lymph nodes. Normal appearing appendix. Normal appearing left ovary. The right ovary is not clearly visualized.  IMPRESSION: 1. No acute abnormality. 2. Bilateral vascular renal calcifications and at least 1 nonobstructing right renal calculus. 3. Small bilateral pleural effusions. 4. Mild right lower lobe cylindrical bronchiectasis. 5. Mild bibasilar atelectasis. 6. Dense coronary artery  atheromatous calcifications.   Electronically Signed   By: Enrique Sack M.D.   On: 12/26/2013 20:13    Scheduled Meds: . aspirin EC  325 mg Oral Daily  . carvedilol  3.125 mg Oral BID WC  . DULoxetine  60 mg Oral Daily  . enoxaparin (LOVENOX) injection  40 mg Subcutaneous QHS  . ertapenem  1 g Intravenous Daily  . gabapentin  300 mg Oral BID  . insulin aspart  0-15 Units Subcutaneous TID WC  . insulin aspart  0-5 Units Subcutaneous QHS  . metoCLOPramide  5 mg Oral TID AC & HS  . simvastatin  20 mg Oral Daily  . sodium chloride  500 mL Intravenous Once  . sodium chloride  3 mL Intravenous Q12H  . timolol  1 drop Left Eye BID   Continuous Infusions: . dextrose 5 % and 0.9% NaCl 50 mL/hr at 12/28/13 1429    Active Problems:   Type 2 diabetes, controlled, with peripheral circulatory disorder   Essential hypertension   Anemia   UTI (lower  urinary tract infection)   Hypernatremia   Hypoxic   CHF (congestive heart failure)   Systolic and diastolic CHF, acute on chronic    Time spent: 25  minutes    [place] [facility] Pager [phone]. If 7PM-7AM, please contact night-coverage at [email or web address], password [facility] 12/28/2013, 3:53 PM  LOS: 2 days

## 2013-12-28 NOTE — Progress Notes (Signed)
Patient is still very lethargic this afternoon.  Dr. Karleen Hampshire is aware and has placed new orders. Will continue to monitor and report any further findings.

## 2013-12-28 NOTE — Evaluation (Signed)
Clinical/Bedside Swallow Evaluation Patient Details  Name: Vermell Madrid MRN: [id number] Date of Birth: 08-Feb-1940  Today's Date: 12/28/2013 Time: 4081-4481 SLP Time Calculation (min) (ACUTE ONLY): 10 min  Past Medical History:  Past Medical History  Diagnosis Date  . Hypertension   . Diabetes mellitus   . Blindness   . TIA (transient ischemic attack)   . Anemia   . GERD (gastroesophageal reflux disease)   . Constipation   . Renal disorder 05/2012    acute Renal Failure   Past Surgical History:  Past Surgical History  Procedure Laterality Date  . Gallbladder surgery    . Esophageal dilation    . Cholecystectomy    . Amputation  02/18/2012    Procedure: AMPUTATION RAY;  Surgeon: [name], MD;  Location: [facility] ORS;  Service: Orthopedics;  Laterality: Right;  right second toe  . Tee without cardioversion  02/21/2012    Procedure: TRANSESOPHAGEAL ECHOCARDIOGRAM (TEE);  Surgeon: [name], MD;  Location: [facility] ENDOSCOPY;  Service: Cardiovascular;  Laterality: N/A;   spoke with [name] from [place] pt will arrive by 815ish( thy change shifts at [facility])  . Tee without cardioversion  02/25/2012    Procedure: TRANSESOPHAGEAL ECHOCARDIOGRAM (TEE);  Surgeon: [name], MD;  Location: [facility];  Service: Cardiovascular;  Laterality: N/A;  . Amputation Right 09/07/2013    Procedure: AMPUTATION RAY;  Surgeon: [name], MD;  Location: [facility];  Service: Orthopedics;  Laterality: Right;  Right Transmetatarsal Amputation   HPI:  73 year old female with history of diabetes type 2, systolic heart failure with EF 40%, blindness, hypertension, currently resides at [facility] presents with hypoxia was found to have urinary tract infection and hypernatremia with mild CHF. Esophageal dilatation completed in 03/1999. MBS in 2014 showed primary esophageal dysphagia with difficult esophageal transit of solids. delayed swallow, trace penetration of thin liquids, cleared with a throat clear.     Assessment / Plan / Recommendation Clinical Impression  Upon SLP arrival RN and NT assessing pt due to decreased arousal. NT had struggled to feed pt and noticed pocketing. SLP found pt to be minimally responsive. Aided in removal of dentures and pocketed food. Pt did nod that she was thristy and accepted taste of water on spoon with oral response and swallow, but then began snoring as if asleep. Pt to remain NPO until arousal improves. SLP will f/u tomorrow to resume diet if pt improved.     Aspiration Risk  Severe    Diet Recommendation NPO   Medication Administration: Via alternative means    Other  Recommendations Oral Care Recommendations: Oral care Q4 per protocol   Follow Up Recommendations  Skilled Nursing facility    Frequency and Duration min 2x/week  2 weeks   Pertinent Vitals/Pain NA    SLP Swallow Goals     Swallow Study Prior Functional Status       General HPI: 73 year old female with history of diabetes type 2, systolic heart failure with EF 40%, blindness, hypertension, currently resides at [facility] presents with hypoxia was found to have urinary tract infection and hypernatremia with mild CHF. Esophageal dilatation completed in 03/1999. MBS in 2014 showed primary esophageal dysphagia with difficult esophageal transit of solids. delayed swallow, trace penetration of thin liquids, cleared with a throat clear.  Type of Study: Bedside swallow evaluation Previous Swallow Assessment: see HPI Diet Prior to this Study: Regular;Thin liquids Temperature Spikes Noted: No Respiratory Status: Nasal cannula History of Recent Intubation: No Behavior/Cognition: Lethargic Oral  Cavity - Dentition: Dentures, top;Dentures, bottom Self-Feeding Abilities: Total assist Patient Positioning: Upright in bed Baseline Vocal Quality: Other (comment) (does not reply) Volitional Cough: Cognitively unable to elicit Volitional Swallow: Unable to elicit    Oral/Motor/Sensory  Function Overall Oral Motor/Sensory Function: Other (comment) (unable to follow oral motor commands)   Ice Chips     Thin Liquid Thin Liquid: Impaired Presentation: Spoon    Nectar Thick Nectar Thick Liquid: Not tested   Honey Thick Honey Thick Liquid: Not tested   Puree Puree: Not tested   Solid   GO    Solid: Not tested      Herbie [place], MA CCC-SLP [phone]  Tan Clopper, Katherene Ponto 12/28/2013,2:35 PM

## 2013-12-28 NOTE — Plan of Care (Signed)
Problem: SLP Dysphagia Goals Goal: Patient will demonstrate readiness for PO's Patient will demonstrate readiness for PO's and/or instrumental swallow study as evidenced by: Outcome: Progressing

## 2013-12-28 NOTE — Plan of Care (Signed)
Problem: Phase I Progression Outcomes Goal: Hemodynamically stable Outcome: Completed/Met Date Met:  12/28/13     

## 2013-12-28 NOTE — Progress Notes (Signed)
RN notified that pt's heart rhythm had a change. 4 EKGs done at this time, refer to chart. Rate controlled (73). VSS at this time. Pt asymptomatic. Denies chest pain. NP on call, Fredirick Maudlin made aware. Will continue to monitor pt closely. [name]

## 2013-12-29 ENCOUNTER — Inpatient Hospital Stay (HOSPITAL_COMMUNITY): Payer: Medicare Other

## 2013-12-29 LAB — CBC
HEMATOCRIT: 32.9 % — AB (ref 36.0–46.0)
Hemoglobin: 10.2 g/dL — ABNORMAL LOW (ref 12.0–15.0)
MCH: 28.3 pg (ref 26.0–34.0)
MCHC: 31 g/dL (ref 30.0–36.0)
MCV: 91.1 fL (ref 78.0–100.0)
Platelets: 290 10*3/uL (ref 150–400)
RBC: 3.61 MIL/uL — ABNORMAL LOW (ref 3.87–5.11)
RDW: 18.2 % — AB (ref 11.5–15.5)
WBC: 8.8 10*3/uL (ref 4.0–10.5)

## 2013-12-29 LAB — GLUCOSE, CAPILLARY
GLUCOSE-CAPILLARY: 108 mg/dL — AB (ref 70–99)
GLUCOSE-CAPILLARY: 152 mg/dL — AB (ref 70–99)
Glucose-Capillary: 91 mg/dL (ref 70–99)
Glucose-Capillary: 85 mg/dL (ref 70–99)
Glucose-Capillary: 94 mg/dL (ref 70–99)
Glucose-Capillary: 88 mg/dL (ref 70–99)
Glucose-Capillary: 100 mg/dL — ABNORMAL HIGH (ref 70–99)
Glucose-Capillary: 107 mg/dL — ABNORMAL HIGH (ref 70–99)

## 2013-12-29 LAB — BASIC METABOLIC PANEL
ANION GAP: 10 (ref 5–15)
BUN: 23 mg/dL (ref 6–23)
CHLORIDE: 103 meq/L (ref 96–112)
CO2: 30 meq/L (ref 19–32)
Calcium: 8.4 mg/dL (ref 8.4–10.5)
Creatinine, Ser: 1.66 mg/dL — ABNORMAL HIGH (ref 0.50–1.10)
GFR calc Af Amer: 35 mL/min — ABNORMAL LOW (ref >90)
GFR calc non Af Amer: 30 mL/min — ABNORMAL LOW (ref >90)
Glucose, Bld: 113 mg/dL — ABNORMAL HIGH (ref 70–99)
POTASSIUM: 4.2 meq/L (ref 3.7–5.3)
Sodium: 143 mEq/L (ref 137–147)

## 2013-12-29 LAB — CREATININE, URINE, RANDOM: Creatinine, Urine: 56.2 mg/dL

## 2013-12-29 LAB — SODIUM, URINE, RANDOM: Sodium, Ur: 93 mEq/L

## 2013-12-29 MED ORDER — SODIUM CHLORIDE 0.9 % IV BOLUS (SEPSIS)
500.0000 mL | Freq: Once | INTRAVENOUS | Status: AC
  Administered 2013-12-29: 500 mL via INTRAVENOUS

## 2013-12-29 NOTE — Progress Notes (Signed)
Speech Language Pathology Treatment: Dysphagia  Patient Details Name: [name] MRN: [id number] DOB: Jan 20, 1941 Today's Date: 12/29/2013 Time: 9798-9211 SLP Time Calculation (min) (ACUTE ONLY): 15 min  Assessment / Plan / Recommendation Clinical Impression  Pt with improved mentation compared to yesterday.  SLP brushed pt's dentures and provided to pt for self placement.  Pt with negative CN exam.  Observed pt consuming thin water, applesauce, graham cracker with adequate oral transiting, timely swallow and clear voice throughout.  Pt does admit to difficulty swallowing pills but declines to take them with applesauce.  Recommend to resume regular consistency diet with set up assist.  Educated pt to findings, recommendations. Will sign off as no further SLP warranted - educated completion.     HPI HPI: 73 year old female with history of diabetes type 2, systolic heart failure with EF 40%, blindness, hypertension, currently resides at [facility] presents with hypoxia was found to have urinary tract infection and hypernatremia with mild CHF. Esophageal dilatation completed in 03/1999. MBS in 2014 showed primary esophageal dysphagia with difficult esophageal transit of solids. delayed swallow, trace penetration of thin liquids, cleared with a throat clear.    Pertinent Vitals Pain Assessment: No/denies pain  SLP Plan  All goals met    Recommendations Diet recommendations: Regular;Thin liquid Liquids provided via: Straw;Cup Medication Administration: Whole meds with liquid Supervision: Patient able to self feed Compensations: Slow rate;Small sips/bites Postural Changes and/or Swallow Maneuvers: Seated upright 90 degrees;Upright 30-60 min after meal              Oral Care Recommendations: Oral care Q4 per protocol Follow up Recommendations: None Plan: All goals met    [place], [place] [facility] [place]

## 2013-12-29 NOTE — Plan of Care (Signed)
Problem: Phase II Progression Outcomes Goal: Obtain order to discontinue catheter if appropriate Outcome: Not Met (add Reason) Pt has chronic foley catheter  Goal: Other Phase II Outcomes/Goals Outcome: Completed/Met Date Met:  12/29/13

## 2013-12-29 NOTE — Progress Notes (Signed)
[facility] PROGRESS NOTE  [name] WLS:[id number] DOB: 1940-04-07 DOA: 12/26/2013 PCP: Hollace Kinnier, DO Interim summary:  73 [facility]  [place] in with mild hypoxia and confusion , CXR showed mild CHF and labs revealed hypernatremia. UA was noted to have evidence of urinary tract infection, she has recently been admitted with Klebsiella pneumoniae UTI and finished a course of ertapenem. She was admitted to hospitalist service for management of CHF and hypernatremia and possible UTI.  On arrival to ED, she was found to be hypotensive and initially admitted to step down for closer monitoring.  Assessment/Plan: 1. 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 As her creatinine was increasing and her urine output decreased to less than 576ml, The lasix was held and she was given some fluid boluses and started on gentle hydration. Her urine output improved and her fluids stopped.  I/O last 3 completed shifts: In: 2007.1 [I.V.:1907.1; IV Piggyback:100] Out: 625 [Urine:625]   .   Resume coreg.    Status post transmetatarsal amputation of right foot;  Wound care to be consulted.   Hypotension; resolved   Urinary retention: Chronic foley catheter in place  Diabetes Mellitus: Resume SSI. Last hgba1c is 6.2  Stage 3 CKD  Slight worsening of the renal function. Continue to monitor. US renal did not show any hydronephrosis or renal stones.    Hypernatremia:  Resolved.    Anemia: Baseline around 10 and stable.   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   Acute encephalopathy Unclear etiology. Abg shows pco2 of 51 and Po2 of 140. Ct head without contrast was ordered, negative for acute pathology..  Today she is more alert and was able to answer  questions. PT evaluation ordered. Speech and swallow eval ordered.         Code Status: full code.  Family Communication: none at bedside.  Disposition Plan: possibly back to SNF in 1 to 2 days.    Consultants:  none  Procedures:  none  Antibiotics:  invanz   Rocephin one dose  HPI/Subjective: Having trouble feeding herself. She denies any pain or sob a t this time.   Objective: Filed Vitals:   12/29/13 1254  BP: 152/82  Pulse: 70  Temp: 97.9 F (36.6 C)  Resp: 18    Intake/Output Summary (Last 24 hours) at 12/29/13 1923 Last data filed at 12/29/13 1844  Gross per 24 hour  Intake 1745.42 ml  Output    375 ml  Net 1370.42 ml   Filed Weights   12/27/13 0400 12/28/13 0349 12/29/13 0648  Weight: 91 kg (200 lb 9.9 oz) 91.3 kg (201 lb 4.5 oz) 87.7 kg (193 lb 5.5 oz)    Exam:   General:  Alert afebrile comfortable  Cardiovascular: s1s2  Respiratory: diminished air entry at bases.   Abdomen: soft non tender non distended bowel sounds heard  Musculoskeletal: pedal edema.   Data Reviewed: Basic Metabolic Panel:  Recent Labs Lab 12/27/13 0040 12/27/13 0514 12/28/13 0445 12/28/13 2012 12/29/13 0455  NA 147 142 140 141 143  K 4.1 3.9 4.7 4.4 4.2  CL 105 102 99 101 103  CO2 32 32 30 29 30   GLUCOSE 153* 169* 137* 101* 113*  BUN 23 22 22 23  23  CREATININE 0.99 0.97 1.19* 1.53* 1.66*  CALCIUM 8.8 8.6 8.6 8.5 8.4   Liver Function Tests:  Recent Labs Lab 12/26/13 1625 12/27/13 0514 12/28/13 2012  AST 15 15 22   ALT 7 7 8   ALKPHOS 122* 110 119*  BILITOT 0.2* 0.3 0.3  PROT 7.1 6.7 6.7  ALBUMIN 2.4* 2.3* 2.3*   No results for input(s): LIPASE, AMYLASE in the last 168 hours.  Recent Labs Lab 12/28/13 1705  AMMONIA 26   CBC:  Recent Labs Lab 12/26/13 1620 12/27/13 0040 12/27/13 0514 12/29/13 1130  WBC 10.7* 9.1 9.5 8.8  NEUTROABS 8.1*  --  6.4  --   HGB 10.8* 10.3* 10.1* 10.2*  HCT 35.7* 33.2* 32.0* 32.9*  MCV 92.2 90.0 90.1  91.1  PLT 297 290 275 290   Cardiac Enzymes:  Recent Labs Lab 12/27/13 0040 12/27/13 0514 12/27/13 1055  TROPONINI <0.30 <0.30 <0.30   BNP (last 3 results)  Recent Labs  12/26/13 1640 12/27/13 0040  PROBNP 5360.0* 4645.0*   CBG:  Recent Labs Lab 12/28/13 0748 12/28/13 2225 12/29/13 0732 12/29/13 1130 12/29/13 1633  GLUCAP 120* 88 100* 108* 107*    Recent Results (from the past 240 hour(s))  MRSA PCR Screening     Status: None   Collection Time: 12/26/13 11:20 PM  Result Value Ref Range Status   MRSA by PCR NEGATIVE NEGATIVE Final    Comment:        The GeneXpert MRSA Assay (FDA approved for NASAL specimens only), is one component of a comprehensive MRSA colonization surveillance program. It is not intended to diagnose MRSA infection nor to guide or monitor treatment for MRSA infections.      Studies: Dg Chest 2 View  12/29/2013   CLINICAL DATA:  Aspiration pneumonia, history diabetes, hypertension  EXAM: CHEST  2 VIEW  COMPARISON:  12/26/2013  FINDINGS: RIGHT arm PICC line tip projects over SVC near cavoatrial junction.  Enlargement of cardiac silhouette with slight pulmonary vascular congestion.  Mediastinal contours normal.  Peribronchial thickening.  Posterior density at the lung bases on lateral view could represent pleural effusion or lower lobe consolidation, potentially in the RIGHT lower lobe.  Upper lungs clear.  No pneumothorax.  Bones demineralized.  IMPRESSION: Bronchitic changes with posterior density at the lung bases on lateral view question pleural effusions, cannot exclude RIGHT lower lobe consolidation.   Electronically Signed   By: Lavonia Dana M.D.   On: 12/29/2013 14:17   Ct Head Wo Contrast  12/28/2013   CLINICAL DATA:  Acute encephalopathy.  Increased lethargy.  EXAM: CT HEAD WITHOUT CONTRAST  TECHNIQUE: Contiguous axial images were obtained from the base of the skull through the vertex without contrast.  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 Stable low-density in the white matter. Stable encephalomalacia in the right frontal lobe. Stable focal low-density in the left frontal cortex. No evidence for acute hemorrhage, mass lesion, midline shift or new large infarct. Again noted are calcifications in both globes, right side greater the left. Fluid in the ethmoid air cells, left side greater than right. No acute bone abnormality.  IMPRESSION: No acute intracranial abnormality.  Stable hydrocephalus.  Minimal change since 12/18/2013.  Evidence for chronic small vessel ischemic changes and old insult in the right frontal lobe.   Electronically Signed   By: Markus Daft M.D.   On: 12/28/2013 23:27   US Renal  12/29/2013   CLINICAL DATA:  73 year old with acute on chronic renal failure  EXAM: RENAL/URINARY TRACT ULTRASOUND COMPLETE  COMPARISON:  09/24/2012  FINDINGS: Right Kidney:  Length: 10.7 cm. Echogenicity within normal limits. No mass or hydronephrosis visualized. No renal stones are identified.  Left Kidney:  Length: 11.6 cm. Echogenicity within normal limits. No mass or hydronephrosis visualized. No renal stones are identified.  Bladder:  The urinary bladder is decompressed.  IMPRESSION: No hydronephrosis or evidence of nephrolithiasis.   Electronically Signed   By: Rosemarie Ax   On: 12/29/2013 14:23    Scheduled Meds: . aspirin EC  325 mg Oral Daily  . carvedilol  3.125 mg Oral BID WC  . DULoxetine  60 mg Oral Daily  . enoxaparin (LOVENOX) injection  40 mg Subcutaneous QHS  . ertapenem  1 g Intravenous Daily  . gabapentin  300 mg Oral BID  . insulin aspart  0-15 Units Subcutaneous TID WC  . insulin aspart  0-5 Units Subcutaneous QHS  . metoCLOPramide  5 mg Oral TID AC & HS  . simvastatin  20 mg Oral Daily  . sodium chloride  3 mL Intravenous Q12H  . timolol  1 drop Left Eye BID   Continuous Infusions:    Active Problems:   Type 2 diabetes,  controlled, with peripheral circulatory disorder   Essential hypertension   Anemia   UTI (lower urinary tract infection)   Hypernatremia   Hypoxic   CHF (congestive heart failure)   Systolic and diastolic CHF, acute on chronic    Time spent: 25  minutes    [facility] Pager [phone]. If 7PM-7AM, please contact night-coverage at [email or web address], password [facility] 12/29/2013, 7:23 PM  LOS: 3 days

## 2013-12-29 NOTE — Progress Notes (Signed)
PT Cancellation Note / Screen  Patient Details Name: [name] MRN: [id number] DOB: 1940/07/15   Cancelled Treatment:    Reason Eval/Treat Not Completed: PT screened, no needs identified, will sign off  Pt seen approx a week ago at [facility] by PT and was not assisting with bed mobility at that time, poor sitting balance, and lift required for success of transfer per PT notes.  Pt long term resident of SNF and reports she was not working with therapy at [facility] and states SNF using lift for OOB.  Pt not appropriate for skilled acute PT at this time.  Recommend nursing staff use lift for OOB.   [name],KATHrine E 12/29/2013, 4:08 PM [name], PT, DPT 12/29/2013 Pager: [phone]

## 2013-12-30 DIAGNOSIS — I5043 Acute on chronic combined systolic (congestive) and diastolic (congestive) heart failure: Principal | ICD-10-CM

## 2013-12-30 LAB — BASIC METABOLIC PANEL
ANION GAP: 13 (ref 5–15)
BUN: 24 mg/dL — AB (ref 6–23)
CHLORIDE: 103 meq/L (ref 96–112)
CO2: 26 mEq/L (ref 19–32)
Calcium: 8.3 mg/dL — ABNORMAL LOW (ref 8.4–10.5)
Creatinine, Ser: 2.1 mg/dL — ABNORMAL HIGH (ref 0.50–1.10)
GFR calc non Af Amer: 23 mL/min — ABNORMAL LOW (ref >90)
GFR, EST AFRICAN AMERICAN: 26 mL/min — AB (ref >90)
Glucose, Bld: 120 mg/dL — ABNORMAL HIGH (ref 70–99)
POTASSIUM: 4 meq/L (ref 3.7–5.3)
Sodium: 142 mEq/L (ref 137–147)

## 2013-12-30 LAB — GLUCOSE, CAPILLARY
GLUCOSE-CAPILLARY: 93 mg/dL (ref 70–99)
Glucose-Capillary: 104 mg/dL — ABNORMAL HIGH (ref 70–99)
Glucose-Capillary: 96 mg/dL (ref 70–99)
Glucose-Capillary: 104 mg/dL — ABNORMAL HIGH (ref 70–99)

## 2013-12-30 MED ORDER — ENOXAPARIN SODIUM 30 MG/0.3ML ~~LOC~~ SOLN
30.0000 mg | Freq: Every day | SUBCUTANEOUS | Status: DC
  Administered 2013-12-30 – 2014-01-06 (×8): 30 mg via SUBCUTANEOUS
  Filled 2013-12-30 (×9): qty 0.3

## 2013-12-30 MED ORDER — ERTAPENEM SODIUM 1 G IJ SOLR
500.0000 mg | INTRAMUSCULAR | Status: AC
  Administered 2013-12-30 – 2014-01-01 (×3): 0.5 g via INTRAVENOUS
  Filled 2013-12-30 (×3): qty 0.5

## 2013-12-30 NOTE — Progress Notes (Signed)
[facility] PROGRESS NOTE  [name] UQJ:[id number] DOB: 07/04/1940 DOA: 12/26/2013 PCP: Hollace Kinnier, DO Interim summary:  73 [facility]  [place] in with mild hypoxia and confusion , CXR showed mild CHF and labs revealed hypernatremia. UA was noted to have evidence of urinary tract infection, she has recently been admitted with Klebsiella pneumoniae UTI and finished a course of ertapenem. She was admitted to hospitalist service for management of CHF and hypernatremia and possible UTI.  On arrival to ED, she was found to be hypotensive and initially admitted to step down for closer monitoring.   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 As her creatinine was increasing and her urine output decreased to less than 517ml, The lasix was held and she was given some fluid boluses and started on gentle hydration. Her urine output improved and her fluids stopped.  I/O last 3 completed shifts: In: 2559.6 [P.O.:240; I.V.:2019.6; Other:250; IV Piggyback:50] Out: 375 [Urine:375] Total I/O In: 480 [P.O.:480] Out: 25 [Urine:25] .   Resume coreg.   AKI Secondary to diuresis, will check bmp in am. As she has urinary retention, will check renal ultrasound.  Status post transmetatarsal amputation of right foot;  Wound care consulted.   Hypotension; resolved   Urinary retention: Chronic foley catheter in place  Diabetes Mellitus: Resume SSI. Last hgba1c is 6.2  Stage 3 CKD  Slight worsening of the renal function. Continue to monitor. US renal did not show any hydronephrosis or renal stones.   Hypernatremia:  Resolved.   Anemia: Baseline around 10 and stable.   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   Acute encephalopathy Unclear  etiology. Abg shows pco2 of 51 and Po2 of 140. Ct head without contrast was ordered, negative for acute pathology..  Today she is more alert and was able to answer questions. PT evaluation ordered. Speech and swallow eval ordered.       Code Status: full code.  Family Communication: none at bedside.  Disposition Plan: possibly back to SNF in 1 to 2 days.    Consultants:  none  Procedures:  none  Antibiotics:  invanz   Rocephin one dose  HPI/Subjective: Patient seen and examined, denies shortness of breath  Objective: Filed Vitals:   12/30/13 1259  BP: 132/60  Pulse: 71  Temp: 98.1 F (36.7 C)  Resp: 18    Intake/Output Summary (Last 24 hours) at 12/30/13 1722 Last data filed at 12/30/13 1300  Gross per 24 hour  Intake 1574.17 ml  Output     25 ml  Net 1549.17 ml   Filed Weights   12/28/13 0349 12/29/13 0648 12/30/13 0612  Weight: 91.3 kg (201 lb 4.5 oz) 87.7 kg (193 lb 5.5 oz) 95.4 kg (210 lb 5.1 oz)    Exam:  Physical Exam: Lungs: Normal respiratory effort, bilateral clear to auscultation, no crackles or wheezes.  Heart: Regular rate and rhythm, S1 and S2 normal, no murmurs, rubs auscultated Abdomen: BS normoactive,soft,nondistended,non-tender to palpation,no organomegaly Extremities: No pretibial edema, no erythema, no cyanosis, no clubbing Neuro : Alert and oriented to time, place and person, No focal deficits   Data Reviewed: Basic Metabolic Panel:  Recent Labs Lab 12/27/13 0514 12/28/13 0445 12/28/13 2012 12/29/13 0455 12/30/13 0600  NA 142 140 141 143 142  K 3.9 4.7 4.4 4.2 4.0  CL 102 99 101 103 103  CO2 32 30 29 30 26   GLUCOSE 169* 137* 101* 113* 120*  BUN 22 22 23 23  24*  CREATININE 0.97 1.19* 1.53* 1.66* 2.10*  CALCIUM 8.6 8.6 8.5 8.4 8.3*   Liver Function Tests:  Recent Labs Lab 12/26/13 1625 12/27/13 0514 12/28/13 2012  AST 15 15 22   ALT 7 7 8   ALKPHOS 122* 110 119*  BILITOT 0.2* 0.3 0.3  PROT 7.1 6.7 6.7  ALBUMIN  2.4* 2.3* 2.3*   No results for input(s): LIPASE, AMYLASE in the last 168 hours.  Recent Labs Lab 12/28/13 1705  AMMONIA 26   CBC:  Recent Labs Lab 12/26/13 1620 12/27/13 0040 12/27/13 0514 12/29/13 1130  WBC 10.7* 9.1 9.5 8.8  NEUTROABS 8.1*  --  6.4  --   HGB 10.8* 10.3* 10.1* 10.2*  HCT 35.7* 33.2* 32.0* 32.9*  MCV 92.2 90.0 90.1 91.1  PLT 297 290 275 290   Cardiac Enzymes:  Recent Labs Lab 12/27/13 0040 12/27/13 0514 12/27/13 1055  TROPONINI <0.30 <0.30 <0.30   BNP (last 3 results)  Recent Labs  12/26/13 1640 12/27/13 0040  PROBNP 5360.0* 4645.0*   CBG:  Recent Labs Lab 12/29/13 1130 12/29/13 1633 12/29/13 2107 12/30/13 0741 12/30/13 1150  GLUCAP 108* 107* 152* 104* 93    Recent Results (from the past 240 hour(s))  MRSA PCR Screening     Status: None   Collection Time: 12/26/13 11:20 PM  Result Value Ref Range Status   MRSA by PCR NEGATIVE NEGATIVE Final    Comment:        The GeneXpert MRSA Assay (FDA approved for NASAL specimens only), is one component of a comprehensive MRSA colonization surveillance program. It is not intended to diagnose MRSA infection nor to guide or monitor treatment for MRSA infections.      Studies: Dg Chest 2 View  12/29/2013   CLINICAL DATA:  Aspiration pneumonia, history diabetes, hypertension  EXAM: CHEST  2 VIEW  COMPARISON:  12/26/2013  FINDINGS: RIGHT arm PICC line tip projects over SVC near cavoatrial junction.  Enlargement of cardiac silhouette with slight pulmonary vascular congestion.  Mediastinal contours normal.  Peribronchial thickening.  Posterior density at the lung bases on lateral view could represent pleural effusion or lower lobe consolidation, potentially in the RIGHT lower lobe.  Upper lungs clear.  No pneumothorax.  Bones demineralized.  IMPRESSION: Bronchitic changes with posterior density at the lung bases on lateral view question pleural effusions, cannot exclude RIGHT lower lobe  consolidation.   Electronically Signed   By: Lavonia Dana M.D.   On: 12/29/2013 14:17   Ct Head Wo Contrast  12/28/2013   CLINICAL DATA:  Acute encephalopathy.  Increased lethargy.  EXAM: CT HEAD WITHOUT CONTRAST  TECHNIQUE: Contiguous axial images were obtained from the base of the skull through the vertex without contrast.  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 Stable low-density in the white matter. Stable encephalomalacia in the right frontal lobe. Stable focal low-density in the left frontal cortex. No evidence for acute hemorrhage, mass lesion, midline shift or new large infarct. Again noted are calcifications in both globes, right side greater the left. Fluid in the ethmoid air cells, left side greater than right. No acute bone abnormality.  IMPRESSION: No acute intracranial abnormality.  Stable hydrocephalus.  Minimal change since 12/18/2013.  Evidence for chronic small vessel ischemic changes and old insult in the right frontal lobe.   Electronically Signed   By: Markus Daft M.D.   On: 12/28/2013 23:27   US Renal  12/29/2013   CLINICAL DATA:  73 year old with acute on chronic renal failure  EXAM: RENAL/URINARY TRACT ULTRASOUND COMPLETE  COMPARISON:  09/24/2012  FINDINGS: Right Kidney:  Length: 10.7 cm. Echogenicity within normal limits. No mass or hydronephrosis visualized. No renal stones are identified.  Left Kidney:  Length: 11.6 cm. Echogenicity within normal limits. No mass or hydronephrosis visualized. No renal stones are identified.  Bladder:  The urinary bladder is decompressed.  IMPRESSION: No hydronephrosis or evidence of nephrolithiasis.   Electronically Signed   By: Rosemarie Ax   On: 12/29/2013 14:23    Scheduled Meds: . aspirin EC  325 mg Oral Daily  . carvedilol  3.125 mg Oral BID WC  . enoxaparin (LOVENOX) injection  30 mg Subcutaneous QHS  . ertapenem  500 mg Intravenous Q24H  . gabapentin  300  mg Oral BID  . insulin aspart  0-15 Units Subcutaneous TID WC  . insulin aspart  0-5 Units Subcutaneous QHS  . metoCLOPramide  5 mg Oral TID AC & HS  . simvastatin  20 mg Oral Daily  . sodium chloride  3 mL Intravenous Q12H  . timolol  1 drop Left Eye BID   Continuous Infusions:    Active Problems:   Type 2 diabetes, controlled, with peripheral circulatory disorder   Essential hypertension   Anemia   UTI (lower urinary tract infection)   Hypernatremia   Hypoxic   CHF (congestive heart failure)   Systolic and diastolic CHF, acute on chronic    Time spent: 25  minutes    [facility] Pager [phone] If 7PM-7AM, please contact night-coverage at [email or web address], password [facility] 12/30/2013, 5:22 PM  LOS: 4 days

## 2013-12-30 NOTE — Care Management Note (Addendum)
    Page 1 of 2   01/07/2014     12:02:32 PM CARE MANAGEMENT NOTE 01/07/2014  Patient:  [name], [name]   Account Number:  [id number]  Date Initiated:  12/30/2013  Documentation initiated by:  Dessa Phi  Subjective/Objective Assessment:   73 Y/O F ADMITTED W/ACUTE ENCEPHALOPATHY,ANEMIA,CHF,UTI.     Action/Plan:   FROM SNF-[facility].   Anticipated DC Date:  01/07/2014   Anticipated DC Plan:  [place]  CM consult      Choice offered to / List presented to:             Status of service:  Completed, signed off Medicare Important Message given?  YES (If response is "NO", the following Medicare IM given date fields will be blank) Date Medicare IM given:  12/29/2013 Medicare IM given by:  [facility] Date Additional Medicare IM given:  01/07/2014 Additional Medicare IM given by:  [facility]  Discharge Disposition:  [place]  Per UR Regulation:  Reviewed for med. necessity/level of care/duration of stay  If discussed at [place] of Stay Meetings, dates discussed:   12/31/2013  01/05/2014  01/07/2014    Comments:  01/07/14 Dessa Phi RN BSN NCM [phone] d/c snf.  01/05/14 Tyrhonda Georgiades RN,BSN NCM [phone] CREAT IMPROVING-2.46.NEPHRO FOLLOWING.D/C PLANS TO SNF ONCE MEDICALLY STABLE.  01/04/14 Sylvana Bonk RN,BSN NCM [phone] CREAT IMPROVING.D/C SNF WHEN STABLE.  12/31/13 Veverly Larimer RN,BSN NCM [phone] KIDNEY FUNCTION WORSE-NEPHROLOGY CONS.D/C PLANS RETURN SNF.  12/30/13 Lazar Tierce RN,BSN NCM [phone] IV ABX.D/C PLAN BACK TO SNF.

## 2013-12-30 NOTE — Plan of Care (Signed)
Problem: Phase I Progression Outcomes Goal: Initial discharge plan identified Outcome: Completed/Met Date Met:  12/30/13  Problem: Phase II Progression Outcomes Goal: Progress activity as tolerated unless otherwise ordered Outcome: Completed/Met Date Met:  12/30/13 Goal: IV changed to normal saline lock Outcome: Completed/Met Date Met:  12/30/13

## 2013-12-30 NOTE — Progress Notes (Signed)
Clinical Social Work Department BRIEF PSYCHOSOCIAL ASSESSMENT 12/30/2013  Patient:  [name], [name]     Account Number:  [id number]     Admit date:  12/26/2013  Clinical Social Worker:  Renold Genta  Date/Time:  12/30/2013 02:41 PM  Referred by:  Physician  Date Referred:  12/30/2013 Referred for  Other - See comment   Other Referral:   Admitted from: [facility]   Interview type:  Other - See comment Other interview type:   patient's guardian, Roxana via phone    PSYCHOSOCIAL DATA Living Status:  FACILITY Admitted from facility:  [place], [place] Level of care:  [place] Primary support name:  [name] ([place]) ph#: [phone] Primary support relationship to patient:  NONE Degree of support available:   good    CURRENT CONCERNS Current Concerns  Post-Acute Placement   Other Concerns:    SOCIAL WORK ASSESSMENT / PLAN CSW received consult that patient was admitted from [facility].   Assessment/plan status:  Information/Referral to [employer] Other assessment/ plan:   Information/referral to community resources:   CSW completed FL2 & faxed information to [facility].    PATIENT'S/FAMILY'S RESPONSE TO PLAN OF CARE: CSW confirmed with patient's guardian, Kerrie Pleasure that patient is to return to [place] at discharge. CSW confirmed with Tammy @ [facility] that they would be able to take patient back when ready.       Raynaldo Opitz, [facility] Clinical Social Worker cell #: [phone]

## 2013-12-31 LAB — CREATININE, SERUM
Creatinine, Ser: 2.69 mg/dL — ABNORMAL HIGH (ref 0.50–1.10)
GFR calc Af Amer: 19 mL/min — ABNORMAL LOW (ref >90)
GFR calc non Af Amer: 17 mL/min — ABNORMAL LOW (ref >90)

## 2013-12-31 LAB — GLUCOSE, CAPILLARY
GLUCOSE-CAPILLARY: 74 mg/dL (ref 70–99)
GLUCOSE-CAPILLARY: 88 mg/dL (ref 70–99)

## 2013-12-31 NOTE — Consult Note (Signed)
WOC wound consult note Reason for Consult: Right foot metatarsal amputation site Wound type:surgical, nonhealing Pressure Ulcer POA: No Measurement:Incision line measures 8cm and there is an area of eschar at the medial end that measures 0.6cm round.  This [place] dry and stable.  There is an area at the lateral end that measures 3cm x 2cm and which presents with a depression (full thickness) wound that measures 1cm x 1cm x 0.4cm. Wound bed: pink, moist Drainage (amount, consistency, odor) light yellow, small amount Periwound:intact, dry and as described above Dressing procedure/placement/frequency:I have implemented a twice daily saline dressing for 14 days. We will continue the previously implemented Prevalon pressure redistribution boot. [facility] nursing team will not follow, but will remain available to this patient, the nursing and medical team.  Please re-consult if needed. Thanks, Maudie Flakes, MSN, RN, [place], [place], [place] [phone])

## 2013-12-31 NOTE — Progress Notes (Signed)
[facility] PROGRESS NOTE  [name] HLK:[id number] DOB: 02-26-1940 DOA: 12/26/2013 PCP: Hollace Kinnier, DO  Interim summary:  73 [facility] old [facility] from [facility]  [place] in with mild hypoxia and confusion , CXR showed mild CHF and labs revealed hypernatremia. UA was noted to have evidence of urinary tract infection, she has recently been admitted with Klebsiella pneumoniae UTI and finished a course of ertapenem. She was admitted to hospitalist service for management of CHF and hypernatremia and possible UTI.  On arrival to ED, she was found to be hypotensive and initially admitted to step down for closer monitoring.   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 As her creatinine was increasing and her urine output decreased to less than 545ml, The lasix was held and she was given some fluid boluses and started on gentle hydration. Her urine output improved and her fluids stopped.  I/O last 3 completed shifts: In: 1414.2 [P.O.:840; I.V.:324.2; Other:250] Out: 175 [Urine:175] Total I/O In: 120 [P.O.:120] Out: -  .   Resume coreg.   AKI Secondary to diuresis,  checked renal ultrasound:  negative for hydronephrosis Creatinine now worsening, today creatinine is 2.69 Will obtain nephrology consultation  Status post transmetatarsal amputation of right foot;  Wound care consulted.   Hypotension; resolved   Urinary retention: Chronic foley catheter in place  Diabetes Mellitus: Resume SSI. Last hgba1c is 6.2  Stage 3 CKD  Slight worsening of the renal function. Continue to monitor. US renal did not show any hydronephrosis or renal stones.   Hypernatremia:  Resolved.   Anemia: Baseline around 10 and stable.   Klebsiella UTI: Last cultures are ESBL and to complete INVANZ till 12/4  Legally blind: Resides at  [facility]   Acute encephalopathy Resolved Unclear etiology. Abg shows pco2 of 51 and Po2 of 140. Ct head without contrast was ordered, negative for acute pathology..    Code Status: full code.  Family Communication: none at bedside.  Disposition Plan: possibly back to SNF in 1 to 2 days.    Consultants:  none  Procedures:  none  Antibiotics:  invanz   Rocephin one dose  HPI/Subjective: Patient seen and examined, denies shortness of breath. Communicating.  Objective: Filed Vitals:   12/31/13 1329  BP: 144/74  Pulse: 72  Temp: 97.5 F (36.4 C)  Resp: 20    Intake/Output Summary (Last 24 hours) at 12/31/13 1525 Last data filed at 12/31/13 1329  Gross per 24 hour  Intake    240 ml  Output    150 ml  Net     90 ml   Filed Weights   12/29/13 0648 12/30/13 0612 12/31/13 0421  Weight: 87.7 kg (193 lb 5.5 oz) 95.4 kg (210 lb 5.1 oz) 97.5 kg (214 lb 15.2 oz)    Exam:  Physical Exam: Lungs: Normal respiratory effort, bilateral clear to auscultation, no crackles or wheezes.  Heart: Regular rate and rhythm, S1 and S2 normal, no murmurs, rubs auscultated Abdomen: BS normoactive,soft,nondistended,non-tender to palpation,no organomegaly Extremities: No pretibial edema, no erythema, no cyanosis, no clubbing Neuro : Alert and oriented to time, place and person, No focal deficits   Data Reviewed: Basic Metabolic Panel:  Recent Labs Lab 12/27/13 0514 12/28/13 0445 12/28/13 2012 12/29/13 0455 12/30/13 0600 12/31/13 0500  NA 142 140 141 143 142  --  K 3.9 4.7 4.4 4.2 4.0  --   CL 102 99 101 103 103  --   CO2 32 30 29 30 26   --   GLUCOSE 169* 137* 101* 113* 120*  --   BUN 22 22 23 23  24*  --   CREATININE 0.97 1.19* 1.53* 1.66* 2.10* 2.69*  CALCIUM 8.6 8.6 8.5 8.4 8.3*  --    Liver Function Tests:  Recent Labs Lab 12/26/13 1625 12/27/13 0514 12/28/13 2012  AST 15 15 22   ALT 7 7 8   ALKPHOS 122* 110 119*  BILITOT 0.2* 0.3 0.3  PROT 7.1 6.7 6.7  ALBUMIN  2.4* 2.3* 2.3*   No results for input(s): LIPASE, AMYLASE in the last 168 hours.  Recent Labs Lab 12/28/13 1705  AMMONIA 26   CBC:  Recent Labs Lab 12/26/13 1620 12/27/13 0040 12/27/13 0514 12/29/13 1130  WBC 10.7* 9.1 9.5 8.8  NEUTROABS 8.1*  --  6.4  --   HGB 10.8* 10.3* 10.1* 10.2*  HCT 35.7* 33.2* 32.0* 32.9*  MCV 92.2 90.0 90.1 91.1  PLT 297 290 275 290   Cardiac Enzymes:  Recent Labs Lab 12/27/13 0040 12/27/13 0514 12/27/13 1055  TROPONINI <0.30 <0.30 <0.30   BNP (last 3 results)  Recent Labs  12/26/13 1640 12/27/13 0040  PROBNP 5360.0* 4645.0*   CBG:  Recent Labs Lab 12/30/13 1150 12/30/13 1709 12/30/13 2201 12/31/13 0858 12/31/13 1202  GLUCAP 93 96 104* 74 88    Recent Results (from the past 240 hour(s))  MRSA PCR Screening     Status: None   Collection Time: 12/26/13 11:20 PM  Result Value Ref Range Status   MRSA by PCR NEGATIVE NEGATIVE Final    Comment:        The GeneXpert MRSA Assay (FDA approved for NASAL specimens only), is one component of a comprehensive MRSA colonization surveillance program. It is not intended to diagnose MRSA infection nor to guide or monitor treatment for MRSA infections.      Studies: No results found.  Scheduled Meds: . aspirin EC  325 mg Oral Daily  . carvedilol  3.125 mg Oral BID WC  . enoxaparin (LOVENOX) injection  30 mg Subcutaneous QHS  . ertapenem  500 mg Intravenous Q24H  . gabapentin  300 mg Oral BID  . insulin aspart  0-15 Units Subcutaneous TID WC  . insulin aspart  0-5 Units Subcutaneous QHS  . metoCLOPramide  5 mg Oral TID AC & HS  . simvastatin  20 mg Oral Daily  . sodium chloride  3 mL Intravenous Q12H  . timolol  1 drop Left Eye BID   Continuous Infusions:    Active Problems:   Type 2 diabetes, controlled, with peripheral circulatory disorder   Essential hypertension   Anemia   UTI (lower urinary tract infection)   Hypernatremia   Hypoxic   CHF (congestive heart  failure)   Systolic and diastolic CHF, acute on chronic    Time spent: 25  minutes    [facility] Pager [phone] If 7PM-7AM, please contact night-coverage at [email or web address], password [facility] 12/31/2013, 3:25 PM  LOS: 5 days

## 2013-12-31 NOTE — Plan of Care (Signed)
Problem: Phase I Progression Outcomes Goal: OOB as tolerated unless otherwise ordered Outcome: Adequate for Discharge  Problem: Phase II Progression Outcomes Goal: Discharge plan established Outcome: Completed/Met Date Met:  12/31/13

## 2014-01-01 ENCOUNTER — Inpatient Hospital Stay (HOSPITAL_COMMUNITY): Payer: Medicare Other

## 2014-01-01 DIAGNOSIS — N179 Acute kidney failure, unspecified: Secondary | ICD-10-CM

## 2014-01-01 LAB — GLUCOSE, CAPILLARY
GLUCOSE-CAPILLARY: 87 mg/dL (ref 70–99)
GLUCOSE-CAPILLARY: 75 mg/dL (ref 70–99)
Glucose-Capillary: 82 mg/dL (ref 70–99)
Glucose-Capillary: 80 mg/dL (ref 70–99)
Glucose-Capillary: 48 mg/dL — ABNORMAL LOW (ref 70–99)
Glucose-Capillary: 70 mg/dL (ref 70–99)
Glucose-Capillary: 85 mg/dL (ref 70–99)

## 2014-01-01 LAB — CREATININE, SERUM
Creatinine, Ser: 3.09 mg/dL — ABNORMAL HIGH (ref 0.50–1.10)
GFR, EST AFRICAN AMERICAN: 16 mL/min — AB (ref >90)
GFR, EST NON AFRICAN AMERICAN: 14 mL/min — AB (ref >90)

## 2014-01-01 LAB — URINALYSIS, ROUTINE W REFLEX MICROSCOPIC
BILIRUBIN URINE: NEGATIVE
GLUCOSE, UA: NEGATIVE mg/dL
KETONES UR: NEGATIVE mg/dL
Nitrite: NEGATIVE
PH: 5.5 (ref 5.0–8.0)
Protein, ur: NEGATIVE mg/dL
Specific Gravity, Urine: 1.008 (ref 1.005–1.030)
Urobilinogen, UA: 0.2 mg/dL (ref 0.0–1.0)

## 2014-01-01 LAB — URINE MICROSCOPIC-ADD ON

## 2014-01-01 MED ORDER — DEXTROSE-NACL 5-0.9 % IV SOLN
INTRAVENOUS | Status: DC
  Administered 2014-01-01 – 2014-01-07 (×2): via INTRAVENOUS

## 2014-01-01 MED ORDER — SODIUM CHLORIDE 0.9 % IV SOLN
250.0000 mL | INTRAVENOUS | Status: DC | PRN
  Administered 2014-01-01: 250 mL via INTRAVENOUS

## 2014-01-01 MED ORDER — FUROSEMIDE 10 MG/ML IJ SOLN
INTRAMUSCULAR | Status: AC
  Filled 2014-01-01: qty 8

## 2014-01-01 MED ORDER — FUROSEMIDE 10 MG/ML IJ SOLN
80.0000 mg | Freq: Three times a day (TID) | INTRAMUSCULAR | Status: DC
  Administered 2014-01-01 – 2014-01-03 (×7): 80 mg via INTRAVENOUS
  Filled 2014-01-01 (×5): qty 8

## 2014-01-01 NOTE — Progress Notes (Signed)
[facility] PROGRESS NOTE  [name] TWS:[id number] DOB: 12-29-1940 DOA: 12/26/2013 PCP: Hollace Kinnier, DO  Interim summary:  73 [facility] old lady from [facility]  [place] in with mild hypoxia and confusion , CXR showed mild CHF and labs revealed hypernatremia. UA was noted to have evidence of urinary tract infection, she has recently been admitted with Klebsiella pneumoniae UTI and finished a course of ertapenem. She was admitted to hospitalist service for management of CHF and hypernatremia and possible UTI.  On arrival to ED, she was found to be hypotensive and initially admitted to step down for closer monitoring.   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 As her creatinine was increasing and her urine output decreased to less than 549ml, The lasix was held and she was given some fluid boluses and started on gentle hydration. Her urine output improved and her fluids stopped.  I/O last 3 completed shifts: In: 450 [P.O.:450] Out: 375 [Urine:375] Total I/O In: -  Out: 200 [Urine:200] .   Resume coreg.   AKI Secondary to diuresis,  checked renal ultrasound:  negative for hydronephrosis Creatinine now worsening, today creatinine is 3.09 Start IV normal saline at 75 ml/hr Will obtain nephrology consultation  Status post transmetatarsal amputation of right foot;  Wound care consulted.   Hypotension; resolved   Urinary retention: Chronic foley catheter in place  Diabetes Mellitus: Resume SSI. Last hgba1c is 6.2  Stage 3 CKD  Slight worsening of the renal function. Continue to monitor. US renal did not show any hydronephrosis or renal stones.   Hypernatremia:  Resolved.   Anemia: Baseline around 10 and stable.   Klebsiella UTI: Last cultures are ESBL and to complete INVANZ till 12/4  Legally  blind: Resides at [facility].   Acute encephalopathy Resolved Unclear etiology. Abg shows pco2 of 51 and Po2 of 140. Ct head without contrast was ordered, negative for acute pathology..    Code Status: full code.  Family Communication: none at bedside.  Disposition Plan: possibly back to SNF in 1 to 2 days.    Consultants:  none  Procedures:  none  Antibiotics:  invanz   Rocephin one dose  HPI/Subjective: Patient seen and examined, denies shortness of breath.  Objective: Filed Vitals:   01/01/14 0818  BP: 154/60  Pulse: 70  Temp:   Resp:     Intake/Output Summary (Last 24 hours) at 01/01/14 1249 Last data filed at 01/01/14 1140  Gross per 24 hour  Intake    450 ml  Output    425 ml  Net     25 ml   Filed Weights   12/30/13 0612 12/31/13 0421 01/01/14 0508  Weight: 95.4 kg (210 lb 5.1 oz) 97.5 kg (214 lb 15.2 oz) 98.1 kg (216 lb 4.3 oz)    Exam:  Physical Exam: Lungs: Normal respiratory effort, bilateral clear to auscultation, no crackles or wheezes.  Heart: Regular rate and rhythm, S1 and S2 normal, no murmurs, rubs auscultated Abdomen: BS normoactive,soft,nondistended,non-tender to palpation,no organomegaly Extremities: No pretibial edema, no erythema, no cyanosis, no clubbing Neuro : Alert and oriented to time, place and person, No focal deficits   Data Reviewed: Basic Metabolic Panel:  Recent Labs Lab 12/27/13 0514 12/28/13 0445 12/28/13 2012 12/29/13 0455 12/30/13 0600 12/31/13 0500 01/01/14 0412  NA 142 140 141 143 142  --   --  K 3.9 4.7 4.4 4.2 4.0  --   --   CL 102 99 101 103 103  --   --   CO2 32 30 29 30 26   --   --   GLUCOSE 169* 137* 101* 113* 120*  --   --   BUN 22 22 23 23  24*  --   --   CREATININE 0.97 1.19* 1.53* 1.66* 2.10* 2.69* 3.09*  CALCIUM 8.6 8.6 8.5 8.4 8.3*  --   --    Liver Function Tests:  Recent Labs Lab 12/26/13 1625 12/27/13 0514 12/28/13 2012  AST 15 15 22   ALT 7 7 8   ALKPHOS 122* 110 119*  BILITOT  0.2* 0.3 0.3  PROT 7.1 6.7 6.7  ALBUMIN 2.4* 2.3* 2.3*   No results for input(s): LIPASE, AMYLASE in the last 168 hours.  Recent Labs Lab 12/28/13 1705  AMMONIA 26   CBC:  Recent Labs Lab 12/26/13 1620 12/27/13 0040 12/27/13 0514 12/29/13 1130  WBC 10.7* 9.1 9.5 8.8  NEUTROABS 8.1*  --  6.4  --   HGB 10.8* 10.3* 10.1* 10.2*  HCT 35.7* 33.2* 32.0* 32.9*  MCV 92.2 90.0 90.1 91.1  PLT 297 290 275 290   Cardiac Enzymes:  Recent Labs Lab 12/27/13 0040 12/27/13 0514 12/27/13 1055  TROPONINI <0.30 <0.30 <0.30   BNP (last 3 results)  Recent Labs  12/26/13 1640 12/27/13 0040  PROBNP 5360.0* 4645.0*   CBG:  Recent Labs Lab 12/31/13 0858 12/31/13 1202 12/31/13 2206 01/01/14 0743 01/01/14 1156  GLUCAP 74 88 87 82 80    Recent Results (from the past 240 hour(s))  MRSA PCR Screening     Status: None   Collection Time: 12/26/13 11:20 PM  Result Value Ref Range Status   MRSA by PCR NEGATIVE NEGATIVE Final    Comment:        The GeneXpert MRSA Assay (FDA approved for NASAL specimens only), is one component of a comprehensive MRSA colonization surveillance program. It is not intended to diagnose MRSA infection nor to guide or monitor treatment for MRSA infections.      Studies: No results found.  Scheduled Meds: . aspirin EC  325 mg Oral Daily  . carvedilol  3.125 mg Oral BID WC  . enoxaparin (LOVENOX) injection  30 mg Subcutaneous QHS  . ertapenem  500 mg Intravenous Q24H  . gabapentin  300 mg Oral BID  . insulin aspart  0-15 Units Subcutaneous TID WC  . insulin aspart  0-5 Units Subcutaneous QHS  . metoCLOPramide  5 mg Oral TID AC & HS  . simvastatin  20 mg Oral Daily  . sodium chloride  3 mL Intravenous Q12H  . timolol  1 drop Left Eye BID   Continuous Infusions:    Active Problems:   Type 2 diabetes, controlled, with peripheral circulatory disorder   Essential hypertension   Anemia   UTI (lower urinary tract infection)    Hypernatremia   Hypoxic   CHF (congestive heart failure)   Systolic and diastolic CHF, acute on chronic    Time spent: 25  minutes    [place] [facility] Pager [phone] If 7PM-7AM, please contact night-coverage at [email or web address], password [facility] 01/01/2014, 12:49 PM  LOS: 6 days

## 2014-01-01 NOTE — Consult Note (Signed)
Renal Service Consult Note [facility]  [place] Pesantez 01/01/2014 Sol Blazing Requesting Physician:  Dr Darrick Meigs  Reason for Consult:  Acute renal failure HPI: The patient is a 73 y.o. 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  Sent from [facility] more lethargic than usual.  SaO2 low in 60's, Na 152. Also abd pain and vomiting.  UA dirty. CXR mild CHF. Recent stay for Klebs ESBL UTI rx with ertapenem.  On admit creat 0.98. Now going up daily to 3.09 today, BUN not sent. UNa was 93 and urine creat 56.  UA 1.024, 5.0, 30 prot, mod LE, neg nitrite, Lg Hb, TNTC wbc, few epi's. Renal US kidneys 10.7/ 11.8 cm, echogenicity within normal limits. No hydro.   No nsaid's, ACE/ARB. Did receive IV contrast on 11/28 with CT scan of abdomen.   Patient very confused, no hx obtained  Chart review: 9/05 - N/V, abd/chest pain > UTI, uncontrolled DM2, dehydration, legally blind 6/09 -  LGIB d/t hemorrhoids, dysphagia w esoph stricture, duod ulcer by EGD, DM2 w retinopathy, UTI, candiduria 11/10 - cystitis, dehydration, DM, blindness d/t diab retinopathy, HTN, achlasia s/[ dilatation, lacunar CVA,  4/11 - syncope > bifascicular block, DM2, blind, lacunar CVA hx, bilat blindness, achalasia. Home PT recommended.  5/13 - 77 yo w weakness/ diarrhea for 3 days > CAP rx w abx, acute / chronic diast HF rx w IV lasix, zaroxlyn; DM2, AKI, pressure ulcers and skin wounds. HTN. DC'd to SNF.  1/14 - MRSA sepsis, R 2nd toe infection > resp arrest in hosp, had toe amp on 1/20, TTE neg for vegetation, ID rec'd 6wk IV abx with Cubicin. Acute on CRF.  ANA/comp neg. Creat improved. Mild diast dys by echo.  5/14 - malaise, AKI w creat 4 > recurrent UTI, vol depletion rx with IVF and abx.  Dec'd lasix dose on dc.  11/19 - 12/21/13 > AMS, UTI d/t ESBL organism, CKD, diarrhea, TiA, blindness, R  TMA, HL, anemia. DM2, HTN   ROS  no nsaid's   no jt pain  no ha  no rash  Past Medical History  Past Medical History  Diagnosis Date  . Hypertension   . Diabetes mellitus   . Blindness   . TIA (transient ischemic attack)   . Anemia   . GERD (gastroesophageal reflux disease)   . Constipation   . Renal disorder 05/2012    acute Renal Failure   Past Surgical History  Past Surgical History  Procedure Laterality Date  . Gallbladder surgery    . Esophageal dilation    . Cholecystectomy    . Amputation  02/18/2012    Procedure: AMPUTATION RAY;  Surgeon: [name], MD;  Location: [facility];  Service: Orthopedics;  Laterality: Right;  right second toe  . Tee without cardioversion  02/21/2012    Procedure: TRANSESOPHAGEAL ECHOCARDIOGRAM (TEE);  Surgeon: [name], MD;  Location: [facility] ENDOSCOPY;  Service: Cardiovascular;  Laterality: N/A;   spoke with [name] from [place] pt will arrive by 815ish( thy change shifts at [place])  . Tee without cardioversion  02/25/2012    Procedure: TRANSESOPHAGEAL ECHOCARDIOGRAM (TEE);  Surgeon: [name], MD;  Location: [place];  Service: Cardiovascular;  Laterality: N/A;  . Amputation Right 09/07/2013    Procedure: AMPUTATION RAY;  Surgeon: [name], MD;  Location: [place];  Service: Orthopedics;  Laterality: Right;  Right Transmetatarsal Amputation   Family History  Family History  Problem Relation Age of Onset  . Heart failure Mother    Social History  reports that she quit smoking about 14 years ago. Her smokeless tobacco use includes Snuff. She reports that she does not drink alcohol or use illicit drugs. Allergies No Known Allergies Home medications Prior to Admission medications   Medication Sig Start Date End Date Taking? Authorizing Provider  acetaminophen (TYLENOL) 325 MG tablet Take 650 mg by mouth every 6 (six) hours as needed for fever.    Yes Historical Provider, MD  aspirin EC 325 MG tablet Take 325 mg by mouth daily.      Yes Historical Provider, MD  Brinzolamide-Brimonidine [facility]) 1-0.2 % SUSP Place 1 drop into the left eye 2 (two) times daily.   Yes Historical Provider, MD  carvedilol (COREG) 3.125 MG tablet Take 3.125 mg by mouth 2 (two) times daily with a meal.   Yes Historical Provider, MD  collagenase (SANTYL) ointment Apply topically daily. 12/21/13  Yes [name], MD  docusate sodium (COLACE) 100 MG capsule Take 100 mg by mouth 2 (two) times daily.   Yes Historical Provider, MD  DULoxetine (CYMBALTA) 60 MG capsule Take 1 capsule (60 mg total) by mouth daily. When completes 2 wks at 30mg  09/19/13  Yes [name], DO  ertapenem 1 g in sodium chloride 0.9 % 50 mL Inject 1 g into the vein daily. 2 weeks from 12/18/13 12/21/13  Yes [name], MD  furosemide (LASIX) 40 MG tablet Take 40 mg by mouth daily.   Yes Historical Provider, MD  gabapentin (NEURONTIN) 100 MG capsule Take 300 mg by mouth 2 (two) times daily. Phantom pain   Yes Historical Provider, MD  HYDROcodone-acetaminophen (NORCO/VICODIN) 5-325 MG per tablet Take one tablet by mouth every 6 hours scheduled for pain 12/21/13  Yes [name], MD  hydrocortisone (ANUSOL-HC) 2.5 % rectal cream Place 1 application rectally 2 (two) times daily as needed for hemorrhoids.    Yes Historical Provider, MD  insulin aspart (NOVOLOG) 100 UNIT/ML injection 0-9 Units, Subcutaneous, 3 times daily with meals,  CBG < 70: implement hypoglycemia protocol CBG 70 - 120: 0 units CBG 121 - 150: 1 unit CBG 151 - 200: 2 units CBG 201 - 250: 3 units CBG 251 - 300: 5 units CBG 301 - 350: 7 units CBG 351 - 400: 9 units CBG > 400: call MD 12/21/13  Yes [name], MD  ipratropium-albuterol (DUONEB) 0.5-2.5 (3) MG/3ML SOLN Take 3 mLs by nebulization every 6 (six) hours as needed (wheezing).   Yes Historical Provider, MD  metoCLOPramide (REGLAN) 5 MG tablet Take 1 tablet (5 mg total) by mouth 4 (four) times daily -  before meals and at bedtime.  12/22/13  Yes [name], MD  Multiple Vitamins-Minerals (MULTIVITAMIN WITH MINERALS) tablet Take 1 tablet by mouth daily.   Yes Historical Provider, MD  polyethylene glycol (MIRALAX / GLYCOLAX) packet Take 17 g by mouth daily. 06/18/12  Yes [name], MD  promethazine (PHENERGAN) 25 MG tablet Take 25 mg by mouth every 6 (six) hours as needed for nausea or vomiting.   Yes Historical Provider, MD  protein supplement (RESOURCE BENEPROTEIN) POWD Take 1 scoop by mouth 3 (three) times daily with meals.   Yes Historical Provider, MD  simvastatin (ZOCOR) 20 MG tablet Take 20 mg by mouth daily.   Yes Historical Provider, MD  sodium  chloride (OCEAN) 0.65 % SOLN nasal spray Place 1 spray into the nose 4 (four) times daily as needed for congestion. 06/29/11  Yes [name], MD  sodium chloride 1 G tablet Take 2 g by mouth 3 (three) times daily.   Yes Historical Provider, MD  timolol (TIMOPTIC) 0.5 % ophthalmic solution Place 1 drop into the left eye 2 (two) times daily.   Yes Historical Provider, MD   Liver Function Tests  Recent Labs Lab 12/26/13 1625 12/27/13 0514 12/28/13 2012  AST 15 15 22   ALT 7 7 8   ALKPHOS 122* 110 119*  BILITOT 0.2* 0.3 0.3  PROT 7.1 6.7 6.7  ALBUMIN 2.4* 2.3* 2.3*   No results for input(s): LIPASE, AMYLASE in the last 168 hours. CBC  Recent Labs Lab 12/26/13 1620 12/27/13 0040 12/27/13 0514 12/29/13 1130  WBC 10.7* 9.1 9.5 8.8  NEUTROABS 8.1*  --  6.4  --   HGB 10.8* 10.3* 10.1* 10.2*  HCT 35.7* 33.2* 32.0* 32.9*  MCV 92.2 90.0 90.1 91.1  PLT 297 290 275 474   Basic Metabolic Panel  Recent Labs Lab 12/26/13 1625 12/27/13 0040 12/27/13 0514 12/28/13 0445 12/28/13 2012 12/29/13 0455 12/30/13 0600 12/31/13 0500 01/01/14 0412  NA 152* 147 142 140 141 143 142  --   --   K 4.7 4.1 3.9 4.7 4.4 4.2 4.0  --   --   CL 108 105 102 99 101 103 103  --   --   CO2 32 32 32 30 29 30 26   --   --   GLUCOSE 180* 153* 169* 137* 101* 113* 120*  --   --    BUN 25* 23 22 22 23 23  24*  --   --   CREATININE 1.01 0.99 0.97 1.19* 1.53* 1.66* 2.10* 2.69* 3.09*  CALCIUM 9.0 8.8 8.6 8.6 8.5 8.4 8.3*  --   --     Filed Vitals:   12/31/13 1329 12/31/13 2024 01/01/14 0508 01/01/14 0818  BP: 144/74 142/63 133/69 154/60  Pulse: 72 69 68 70  Temp: 97.5 F (36.4 C) 98.6 F (37 C) 98.2 F (36.8 C)   TempSrc: Axillary Axillary Axillary   Resp: 20 18 18    Height:      Weight:   98.1 kg (216 lb 4.3 oz)   SpO2: 99% 94% 97%    Exam Obese AAF lying flat, poorly responsive, "I just want to sleep! No rash, cyanosis or gangrene Sclera anicteric, throat clear +JVD Chest most clear on R, rales L base RRR distant HS, no noted M or rub ABd very obese, nontender, nondist GU foley in place Ext diffuse edema/ anasarca of hips/thigh areas bilat, minimal edema lower legs and arms Neuro somnolent, oriented to person only ("2024", "we're in [place]")   Creat 0.98 >> 3.09 today  UNa was = 93 and urine creat = 56 UA 1.024, 5.0, 30 prot, mod LE, neg nitrite, Lg Hb, TNTC wbc, few epi's.  US kidneys 10.7/ 11.8 cm, no hydro  Assessment: 1. Acute renal failure (AKI) - probable acute contrast nephropathy (11/28 w CT scan of abdomen), +/- exacerbated by diast or RHF 2. UTI / urine retention - foley in place, don't see UCx in chart 3. Vol overload / anasarca - up 9kg from admit and overloaded on exam 4. Hypotension - resolved 5. DM2  6. Legal blindness d/t diab retinopathy 7. Anemia Hb 10's 8. Hx prior CVA 9. R foot TMA surgery 10. Diast HF 11.  Morbid obesity - longstanding issue   Plan- give IV lasix 80 every 8 hours for volume overload. Keep foley in.  Prognosis guarded. Poor RRT candidate given severe comorbidities.  Recommend conservative management. Avoid ACE/ARB/NSAID/contrast.  CXR.    Kelly Splinter MD (pgr) [phone]    [phone] 01/01/2014, 1:39 PM

## 2014-01-01 NOTE — Plan of Care (Signed)
Problem: Phase III Progression Outcomes Goal: Pain controlled on oral analgesia Outcome: Completed/Met Date Met:  01/01/14 Goal: Activity at appropriate level-compared to baseline (UP IN CHAIR FOR HEMODIALYSIS)  Outcome: Completed/Met Date Met:  01/01/14 Goal: Voiding independently Outcome: Not Applicable Date Met:  01/01/14 Goal: Foley discontinued Outcome: Not Progressing     

## 2014-01-02 DIAGNOSIS — N179 Acute kidney failure, unspecified: Secondary | ICD-10-CM

## 2014-01-02 LAB — BASIC METABOLIC PANEL
ANION GAP: 14 (ref 5–15)
BUN: 31 mg/dL — ABNORMAL HIGH (ref 6–23)
CALCIUM: 8.6 mg/dL (ref 8.4–10.5)
CO2: 26 mEq/L (ref 19–32)
Chloride: 102 mEq/L (ref 96–112)
Creatinine, Ser: 2.9 mg/dL — ABNORMAL HIGH (ref 0.50–1.10)
GFR calc Af Amer: 18 mL/min — ABNORMAL LOW (ref >90)
GFR calc non Af Amer: 15 mL/min — ABNORMAL LOW (ref >90)
GLUCOSE: 85 mg/dL (ref 70–99)
Potassium: 4.2 mEq/L (ref 3.7–5.3)
Sodium: 142 mEq/L (ref 137–147)

## 2014-01-02 LAB — GLUCOSE, CAPILLARY
GLUCOSE-CAPILLARY: 91 mg/dL (ref 70–99)
GLUCOSE-CAPILLARY: 78 mg/dL (ref 70–99)
Glucose-Capillary: 114 mg/dL — ABNORMAL HIGH (ref 70–99)
Glucose-Capillary: 134 mg/dL — ABNORMAL HIGH (ref 70–99)
Glucose-Capillary: 63 mg/dL — ABNORMAL LOW (ref 70–99)
Glucose-Capillary: 76 mg/dL (ref 70–99)
Glucose-Capillary: 77 mg/dL (ref 70–99)

## 2014-01-02 MED ORDER — DEXTROSE 50 % IV SOLN
INTRAVENOUS | Status: AC
  Administered 2014-01-02: 50 mL
  Filled 2014-01-02: qty 50

## 2014-01-02 MED ORDER — FUROSEMIDE 10 MG/ML IJ SOLN
INTRAMUSCULAR | Status: AC
  Filled 2014-01-02: qty 8

## 2014-01-02 NOTE — Plan of Care (Signed)
Problem: Phase III Progression Outcomes Goal: Discharge plan remains appropriate-arrangements made Outcome: Completed/Met Date Met:  01/02/14

## 2014-01-02 NOTE — Progress Notes (Signed)
Hypoglycemic Event  CBG: 48  Treatment: 15 GM carbohydrate snack  Symptoms: None  Follow-up CBG: Time: 2229 CBG Result:70  Possible Reasons for Event: Inadequate meal intake  Comments/MD notified:M Donnal Debar, NP notified. New orders given and followed. Will continue to monitor the pt.     Sabino Gasser N  Remember to initiate Hypoglycemia Order Set & complete

## 2014-01-02 NOTE — Progress Notes (Signed)
[facility] Progress Note   Subjective: 1800 cc UOP yest on IV lasix, 2200 so far today. Creat down to 2.90.   Filed Vitals:   01/02/14 0402 01/02/14 0500 01/02/14 1035 01/02/14 1351  BP: 141/85  131/62 116/55  Pulse: 73  62 60  Temp: 97.3 F (36.3 C)   97.4 F (36.3 C)  TempSrc: Axillary   Axillary  Resp: 18  18 16   Height:      Weight:  98.2 kg (216 lb 7.9 oz)    SpO2: 100%  100% 100%   Exam: Obese AAF, no distress, chronically ill appearing, responds minimally, arousable Sclera anicteric, throat clear +JVD Chest most clear on R, rales L base RRR distant HS, no noted M or rub ABd very obese, nontender, nondist GU foley in place Ext diffuse edema/ anasarca of hips/thigh areas bilat, minimal edema lower legs and arms Neuro somnolent, oriented to person only   UNa was = 93 and urine creat = 56 UA 1.024, 5.0, 30 prot, mod LE, neg nitrite, Lg Hb, TNTC wbc, few epi's.  US kidneys 10.7/ 11.8 cm, no hydro  Assessment: 1. Acute renal failure (AKI) - contrast nephropathy and/or decompensated diast HF/ right HF. Vol overloaded. Creat was 0.98 on admission. Improving slightly w diuresis.   2. UTI / urine retention - foley in place 3. Vol overload / anasarca - up 7kg from admit 4. Hypotension - resolved 5. DM2  6. Legal blindness d/t diab retinopathy 7. Anemia Hb 10's 8. Hx prior CVA 9. PVD w R foot TMA 10. Diast HF 11. Morbid obesity   Plan- Continue diuresis     Kelly Splinter MD  pager [phone]    cell [phone]  01/02/2014, 8:43 PM     Recent Labs Lab 12/29/13 0455 12/30/13 0600 12/31/13 0500 01/01/14 0412 01/02/14 0425  NA 143 142  --   --  142  K 4.2 4.0  --   --  4.2  CL 103 103  --   --  102  CO2 30 26  --   --  26  GLUCOSE 113* 120*  --   --  85  BUN 23 24*  --   --  31*  CREATININE 1.66* 2.10* 2.69* 3.09* 2.90*  CALCIUM 8.4 8.3*  --   --  8.6    Recent Labs Lab 12/27/13 0514 12/28/13 2012  AST 15 22  ALT 7 8  ALKPHOS 110  119*  BILITOT 0.3 0.3  PROT 6.7 6.7  ALBUMIN 2.3* 2.3*    Recent Labs Lab 12/27/13 0040 12/27/13 0514 12/29/13 1130  WBC 9.1 9.5 8.8  NEUTROABS  --  6.4  --   HGB 10.3* 10.1* 10.2*  HCT 33.2* 32.0* 32.9*  MCV 90.0 90.1 91.1  PLT 290 275 290   . aspirin EC  325 mg Oral Daily  . carvedilol  3.125 mg Oral BID WC  . enoxaparin (LOVENOX) injection  30 mg Subcutaneous QHS  . furosemide  80 mg Intravenous 3 times per day  . insulin aspart  0-15 Units Subcutaneous TID WC  . insulin aspart  0-5 Units Subcutaneous QHS  . metoCLOPramide  5 mg Oral TID AC & HS  . simvastatin  20 mg Oral Daily  . sodium chloride  3 mL Intravenous Q12H  . timolol  1 drop Left Eye BID   . dextrose 5 % and 0.9% NaCl 10 mL/hr at 01/01/14 2301   sodium chloride, acetaminophen, ipratropium-albuterol, ondansetron (ZOFRAN) IV, sodium  chloride, sodium chloride

## 2014-01-02 NOTE — Progress Notes (Signed)
[facility] PROGRESS NOTE  [name] MWU:[id number] DOB: [birth year] DOA: 12/26/2013 PCP: Hollace Kinnier, DO  Interim summary:  73 [facility]  [place] in with mild hypoxia and confusion , CXR showed mild CHF and labs revealed hypernatremia. UA was noted to have evidence of urinary tract infection, she has recently been admitted with Klebsiella pneumoniae UTI and finished a course of ertapenem. She was admitted to hospitalist service for management of CHF and hypernatremia and possible UTI.  On arrival to ED, she was found to be hypotensive and initially admitted to step down for closer monitoring.   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 As her creatinine was increasing and her urine output decreased to less than 561ml, The lasix was held and she was given some fluid boluses and started on gentle hydration. Her urine output improved and her fluids stopped.  I/O last 3 completed shifts: In: 1218.2 [P.O.:570; I.V.:648.2] Out: 2025 [Urine:2025]   .   Resume coreg.   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   Urinary retention: Chronic foley catheter in place  Diabetes Mellitus: Resume SSI. Last hgba1c is 6.2  Hypernatremia:  Resolved.   Anemia: Baseline around 10 and stable.   Klebsiella UTI: Last cultures are ESBL and to complete INVANZ till 12/4  Legally blind: Resides at [facility].   Acute encephalopathy Resolved Unclear etiology. Abg shows pco2 of 51 and Po2 of 140. Ct head without contrast  was ordered, negative for acute pathology..    Code Status: full code.  Family Communication: none at bedside.  Disposition Plan: possibly back to SNF in 1 to 2 days.    Consultants:  none  Procedures:  none  Antibiotics:  invanz   Rocephin one dose  HPI/Subjective: Patient seen and examined, denies shortness of breath.  Objective: Filed Vitals:   01/02/14 0402  BP: 141/85  Pulse: 73  Temp: 97.3 F (36.3 C)  Resp: 18    Intake/Output Summary (Last 24 hours) at 01/02/14 0842 Last data filed at 01/02/14 0700  Gross per 24 hour  Intake 888.16 ml  Output   1800 ml  Net -911.84 ml   Filed Weights   12/31/13 0421 01/01/14 0508 01/02/14 0500  Weight: 97.5 kg (214 lb 15.2 oz) 98.1 kg (216 lb 4.3 oz) 98.2 kg (216 lb 7.9 oz)    Exam:  Physical Exam: Lungs: Normal respiratory effort, bilateral clear to auscultation, no crackles or wheezes.  Heart: Regular rate and rhythm, S1 and S2 normal, no murmurs, rubs auscultated Abdomen: BS normoactive,soft,nondistended,non-tender to palpation,no organomegaly Extremities: No pretibial edema, no erythema, no cyanosis, no clubbing Neuro : Somnolent, but answers questions   Data Reviewed: Basic Metabolic Panel:  Recent Labs Lab 12/28/13 0445 12/28/13 2012 12/29/13 0455 12/30/13 0600 12/31/13 0500 01/01/14 0412 01/02/14 0425  NA 140 141 143 142  --   --  142  K 4.7 4.4 4.2 4.0  --   --  4.2  CL 99 101 103 103  --   --  102  CO2 30 29  30 26  --   --  26  GLUCOSE 137* 101* 113* 120*  --   --  85  BUN 22 23 23  24*  --   --  31*  CREATININE 1.19* 1.53* 1.66* 2.10* 2.69* 3.09* 2.90*  CALCIUM 8.6 8.5 8.4 8.3*  --   --  8.6   Liver Function Tests:  Recent Labs Lab 12/26/13 1625 12/27/13 0514 12/28/13 2012  AST 15 15 22   ALT 7 7 8   ALKPHOS 122* 110 119*  BILITOT 0.2* 0.3 0.3  PROT 7.1 6.7 6.7  ALBUMIN 2.4* 2.3* 2.3*   No results for input(s): LIPASE, AMYLASE in the last 168 hours.  Recent Labs Lab  12/28/13 1705  AMMONIA 26   CBC:  Recent Labs Lab 12/26/13 1620 12/27/13 0040 12/27/13 0514 12/29/13 1130  WBC 10.7* 9.1 9.5 8.8  NEUTROABS 8.1*  --  6.4  --   HGB 10.8* 10.3* 10.1* 10.2*  HCT 35.7* 33.2* 32.0* 32.9*  MCV 92.2 90.0 90.1 91.1  PLT 297 290 275 290   Cardiac Enzymes:  Recent Labs Lab 12/27/13 0040 12/27/13 0514 12/27/13 1055  TROPONINI <0.30 <0.30 <0.30   BNP (last 3 results)  Recent Labs  12/26/13 1640 12/27/13 0040  PROBNP 5360.0* 4645.0*   CBG:  Recent Labs Lab 01/01/14 2142 01/01/14 2229 01/01/14 2302 01/02/14 0407 01/02/14 0735  GLUCAP 48* 70 85 77 91    Recent Results (from the past 240 hour(s))  MRSA PCR Screening     Status: None   Collection Time: 12/26/13 11:20 PM  Result Value Ref Range Status   MRSA by PCR NEGATIVE NEGATIVE Final    Comment:        The GeneXpert MRSA Assay (FDA approved for NASAL specimens only), is one component of a comprehensive MRSA colonization surveillance program. It is not intended to diagnose MRSA infection nor to guide or monitor treatment for MRSA infections.      Studies: Dg Chest Port 1 View  01/01/2014   CLINICAL DATA:  Pulmonary edema.  EXAM: PORTABLE CHEST - 1 VIEW  COMPARISON:  12/29/2013  FINDINGS: Stable positioning of right-sided PICC line with the tip at the cavoatrial junction. Lung volumes are low bilaterally with evidence of persistent mild interstitial edema. Bibasilar atelectasis present. The heart size is stable and mildly enlarged.  IMPRESSION: Low lung volumes with increase in bibasilar atelectasis. Mild interstitial edema and stable cardiomegaly.   Electronically Signed   By: Aletta Edouard M.D.   On: 01/01/2014 14:49    Scheduled Meds: . aspirin EC  325 mg Oral Daily  . carvedilol  3.125 mg Oral BID WC  . enoxaparin (LOVENOX) injection  30 mg Subcutaneous QHS  . furosemide  80 mg Intravenous 3 times per day  . insulin aspart  0-15 Units Subcutaneous TID WC  . insulin  aspart  0-5 Units Subcutaneous QHS  . metoCLOPramide  5 mg Oral TID AC & HS  . simvastatin  20 mg Oral Daily  . sodium chloride  3 mL Intravenous Q12H  . timolol  1 drop Left Eye BID   Continuous Infusions: . dextrose 5 % and 0.9% NaCl 10 mL/hr at 01/01/14 2301    Active Problems:   Type 2 diabetes, controlled, with peripheral circulatory disorder   Essential hypertension   Anemia   UTI (lower urinary tract infection)   Hypernatremia   Hypoxic   CHF (congestive heart failure)   Systolic and diastolic CHF, acute on chronic   AKI (  acute kidney injury)    Time spent: 25  minutes    [facility] Pager [phone] If 7PM-7AM, please contact night-coverage at [email or web address], password [facility] 01/02/2014, 8:42 AM  LOS: 7 days

## 2014-01-02 NOTE — Progress Notes (Signed)
Hypoglycemic Event  CBG: 63  Treatment: D50 IV 1ml  Symptoms: lethargic  Follow-up CBG: Time:1213 CBG Result:134  Possible Reasons for Event: Inadequate meal intake  Comments/MD notified:    Annelies Coyt A  Remember to initiate Hypoglycemia Order Set & complete

## 2014-01-02 NOTE — Progress Notes (Signed)
Pt lethargic and hard to arouse. Pt does respond to tactile stimulation.  Pt able to squeeze both hands with weak strength.  When attempting to get blood pressure pt holds left arm tight against chest, when RN states that she wants to get BP pt relaxes arm but does not verbally respond.  MD made aware of pt lethargy.  [name]

## 2014-01-03 DIAGNOSIS — I5020 Unspecified systolic (congestive) heart failure: Secondary | ICD-10-CM

## 2014-01-03 LAB — GLUCOSE, CAPILLARY
GLUCOSE-CAPILLARY: 84 mg/dL (ref 70–99)
GLUCOSE-CAPILLARY: 140 mg/dL — AB (ref 70–99)
Glucose-Capillary: 91 mg/dL (ref 70–99)
Glucose-Capillary: 85 mg/dL (ref 70–99)
Glucose-Capillary: 100 mg/dL — ABNORMAL HIGH (ref 70–99)
Glucose-Capillary: 117 mg/dL — ABNORMAL HIGH (ref 70–99)

## 2014-01-03 LAB — BASIC METABOLIC PANEL
Anion gap: 12 (ref 5–15)
BUN: 31 mg/dL — ABNORMAL HIGH (ref 6–23)
CO2: 31 mEq/L (ref 19–32)
Calcium: 8.7 mg/dL (ref 8.4–10.5)
Chloride: 101 mEq/L (ref 96–112)
Creatinine, Ser: 2.79 mg/dL — ABNORMAL HIGH (ref 0.50–1.10)
GFR, EST AFRICAN AMERICAN: 19 mL/min — AB (ref >90)
GFR, EST NON AFRICAN AMERICAN: 16 mL/min — AB (ref >90)
Glucose, Bld: 98 mg/dL (ref 70–99)
Potassium: 4.3 mEq/L (ref 3.7–5.3)
Sodium: 144 mEq/L (ref 137–147)

## 2014-01-03 MED ORDER — FUROSEMIDE 10 MG/ML IJ SOLN
80.0000 mg | Freq: Two times a day (BID) | INTRAMUSCULAR | Status: DC
  Administered 2014-01-04: 80 mg via INTRAVENOUS
  Filled 2014-01-03: qty 8

## 2014-01-03 NOTE — Clinical Social Work Note (Signed)
CSW reviewed MD notes today to assess whether pt would be ready for discharge  MD note dated today(01/03/14) reflects pt is not ready for discharge today but might be ready in "1 to 2 days"  CSW will continue to monitor pt needs until discharge  Dede Query, [place] Social Worker - Weekend Coverage cell #: [phone]

## 2014-01-03 NOTE — Progress Notes (Addendum)
  [facility] Progress Note   Subjective: 4200 UOP yesterday , creat down 2.7  Filed Vitals:   01/03/14 0510 01/03/14 0914 01/03/14 1224 01/03/14 1733  BP: 151/74  109/47 112/52  Pulse: 61 65 61 65  Temp: 97.6 F (36.4 C)  97.5 F (36.4 C)   TempSrc: Axillary  Axillary   Resp: 16  16   Height:      Weight: 95.1 kg (209 lb 10.5 oz)     SpO2: 97%  96%    Exam: Obese AAF, responsive, chronically ill appearing Sclera anicteric, throat clear +JVD Chest clear bilat RRR distant HS, no noted M or rub ABd very obese, nontender, nondist GU foley in place Improved but persistent bilat LE edema (dependent areas) > UE edema Neuro somnolent, oriented to person only   UNa  93 and Ucreat 56 UA 1.024, 5.0, 30 prot, mod LE, neg nitrite, Lg Hb, TNTC wbc, few epi's.  US kidneys 10.7/ 11.8 cm, no hydro  Assessment: 1. Acute renal failure (AKI) - improving, due to contrast nephropathy and/or decompensated diast / right HF. Vol overload improving. Baseline creat 0.98.     2. UTI / urine retention - foley in place 3. Vol overload / anasarca - diuresing 4. Hypotension - resolved 5. DM2  6. Legal blindness d/t diab retinopathy 7. Anemia Hb 10's 8. Hx prior CVA 9. PVD w R foot TMA  Plan- decrease lasix to 80mg  IV q 12, check creat daily    Kelly Splinter MD  pager [phone]    cell [phone]  01/03/2014, 6:57 PM     Recent Labs Lab 12/30/13 0600  01/01/14 0412 01/02/14 0425 01/03/14 0847  NA 142  --   --  142 144  K 4.0  --   --  4.2 4.3  CL 103  --   --  102 101  CO2 26  --   --  26 31  GLUCOSE 120*  --   --  85 98  BUN 24*  --   --  31* 31*  CREATININE 2.10*  < > 3.09* 2.90* 2.79*  CALCIUM 8.3*  --   --  8.6 8.7  < > = values in this interval not displayed.  Recent Labs Lab 12/28/13 2012  AST 22  ALT 8  ALKPHOS 119*  BILITOT 0.3  PROT 6.7  ALBUMIN 2.3*    Recent Labs Lab 12/29/13 1130  WBC 8.8  HGB 10.2*  HCT 32.9*  MCV 91.1  PLT 290   .  aspirin EC  325 mg Oral Daily  . carvedilol  3.125 mg Oral BID WC  . enoxaparin (LOVENOX) injection  30 mg Subcutaneous QHS  . furosemide  80 mg Intravenous 3 times per day  . insulin aspart  0-15 Units Subcutaneous TID WC  . insulin aspart  0-5 Units Subcutaneous QHS  . metoCLOPramide  5 mg Oral TID AC & HS  . simvastatin  20 mg Oral Daily  . sodium chloride  3 mL Intravenous Q12H  . timolol  1 drop Left Eye BID   . dextrose 5 % and 0.9% NaCl 10 mL/hr at 01/01/14 2301   sodium chloride, acetaminophen, ipratropium-albuterol, ondansetron (ZOFRAN) IV, sodium chloride, sodium chloride

## 2014-01-03 NOTE — Progress Notes (Addendum)
[facility] PROGRESS NOTE  [name] [id number]:[id number] DOB: Feb 18, 1940 DOA: 12/26/2013 PCP: Hollace Kinnier, DO  Interim summary:  73 [facility]  [place] in with mild hypoxia and confusion , CXR showed mild CHF and labs revealed hypernatremia. UA was noted to have evidence of urinary tract infection, she has recently been admitted with Klebsiella pneumoniae UTI and finished a course of ertapenem. She was admitted to hospitalist service for management of CHF and hypernatremia and possible UTI.  On arrival to ED, she was found to be hypotensive and initially admitted to step down for closer monitoring.   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 As her creatinine was increasing and her urine output decreased to less than 578ml, The lasix was held and she was given some fluid boluses and started on gentle hydration. Her urine output improved and her fluids stopped.  I/O last 3 completed shifts: In: 854.8 [P.O.:495; I.V.:359.8] Out: 5800 [Urine:5800] Total I/O In: 240 [P.O.:240] Out: -  .   Resume coreg.  IV Lasix started with good diuretic response.  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   Urinary retention: Chronic foley catheter in place  Diabetes Mellitus: Resume SSI.  Last hgba1c is 6.2  Hypernatremia:  Resolved.   Anemia: Baseline around 10 and stable.   Klebsiella UTI: Last cultures are ESBL and to complete INVANZ till 12/4  Legally blind: Resides at [facility].   Acute  encephalopathy Resolved Unclear etiology. Abg shows pco2 of 51 and Po2 of 140.  Ct head without contrast was ordered, negative for acute pathology..    Code Status: full code.  Family Communication: none at bedside.  Disposition Plan: possibly back to SNF in 1 to 2 days.    Consultants:  none  Procedures:  none  Antibiotics:  invanz   Rocephin one dose  HPI/Subjective: Patient seen and examined, denies shortness of breath. Cr is now slowly improving.  Objective: Filed Vitals:   01/03/14 0914  BP:   Pulse: 65  Temp:   Resp:     Intake/Output Summary (Last 24 hours) at 01/03/14 1024 Last data filed at 01/03/14 0910  Gross per 24 hour  Intake    735 ml  Output   4200 ml  Net  -3465 ml   Filed Weights   01/01/14 0508 01/02/14 0500 01/03/14 0510  Weight: 98.1 kg (216 lb 4.3 oz) 98.2 kg (216 lb 7.9 oz) 95.1 kg (209 lb 10.5 oz)    Exam:  Physical Exam: Lungs: Normal respiratory effort, bilateral clear to auscultation, no crackles or wheezes.  Heart: Regular rate and rhythm, S1 and S2 normal, no murmurs, rubs auscultated Abdomen: BS normoactive,soft,nondistended,non-tender to palpation,no organomegaly Extremities: No pretibial edema, no erythema, no cyanosis, no clubbing Neuro : Somnolent, but answers questions   Data Reviewed: Basic Metabolic Panel:  Recent Labs Lab 12/28/13 2012 12/29/13 0455 12/30/13 0600 12/31/13 0500 01/01/14 0412 01/02/14 0425 01/03/14 0847  NA 141 143 142  --   --  142 144  K 4.4 4.2 4.0  --   --  4.2 4.3  CL 101 103 103  --   --  102 101  CO2 29 30 26   --   --  26 31  GLUCOSE 101* 113* 120*  --   --  85 98  BUN 23 23 24*  --   --  31* 31*  CREATININE 1.53* 1.66* 2.10* 2.69* 3.09* 2.90* 2.79*  CALCIUM 8.5 8.4 8.3*  --   --  8.6 8.7   Liver Function Tests:  Recent Labs Lab 12/28/13 2012  AST 22  ALT 8  ALKPHOS 119*  BILITOT 0.3  PROT 6.7  ALBUMIN 2.3*   No results for input(s): LIPASE, AMYLASE in the last 168  hours.  Recent Labs Lab 12/28/13 1705  AMMONIA 26   CBC:  Recent Labs Lab 12/29/13 1130  WBC 8.8  HGB 10.2*  HCT 32.9*  MCV 91.1  PLT 290   Cardiac Enzymes:  Recent Labs Lab 12/27/13 1055  TROPONINI <0.30   BNP (last 3 results)  Recent Labs  12/26/13 1640 12/27/13 0040  PROBNP 5360.0* 4645.0*   CBG:  Recent Labs Lab 01/02/14 1640 01/02/14 2007 01/02/14 2357 01/03/14 0352 01/03/14 0733  GLUCAP 114* 78 76 85 84    Recent Results (from the past 240 hour(s))  MRSA PCR Screening     Status: None   Collection Time: 12/26/13 11:20 PM  Result Value Ref Range Status   MRSA by PCR NEGATIVE NEGATIVE Final    Comment:        The GeneXpert MRSA Assay (FDA approved for NASAL specimens only), is one component of a comprehensive MRSA colonization surveillance program. It is not intended to diagnose MRSA infection nor to guide or monitor treatment for MRSA infections.      Studies: Dg Chest Port 1 View  01/01/2014   CLINICAL DATA:  Pulmonary edema.  EXAM: PORTABLE CHEST - 1 VIEW  COMPARISON:  12/29/2013  FINDINGS: Stable positioning of right-sided PICC line with the tip at the cavoatrial junction. Lung volumes are low bilaterally with evidence of persistent mild interstitial edema. Bibasilar atelectasis present. The heart size is stable and mildly enlarged.  IMPRESSION: Low lung volumes with increase in bibasilar atelectasis. Mild interstitial edema and stable cardiomegaly.   Electronically Signed   By: Aletta Edouard M.D.   On: 01/01/2014 14:49    Scheduled Meds: . aspirin EC  325 mg Oral Daily  . carvedilol  3.125 mg Oral BID WC  . enoxaparin (LOVENOX) injection  30 mg Subcutaneous QHS  . furosemide  80 mg Intravenous 3 times per day  . insulin aspart  0-15 Units Subcutaneous TID WC  . insulin aspart  0-5 Units Subcutaneous QHS  . metoCLOPramide  5 mg Oral TID AC & HS  . simvastatin  20 mg Oral Daily  . sodium chloride  3 mL Intravenous Q12H  . timolol   1 drop Left Eye BID   Continuous Infusions: . dextrose 5 % and 0.9% NaCl 10 mL/hr at 01/01/14 2301    Active Problems:   Type 2 diabetes, controlled, with peripheral circulatory disorder   Essential hypertension   Anemia   UTI (lower urinary tract infection)   Hypernatremia   Hypoxic   CHF (congestive heart failure)   Systolic and diastolic CHF, acute on chronic   AKI (acute kidney injury)    Time spent: 25  minutes    [facility] Pager [phone] If 7PM-7AM, please contact night-coverage at [email or web address], password [facility] 01/03/2014, 10:24 AM  LOS: 8 days

## 2014-01-04 LAB — BASIC METABOLIC PANEL
Anion gap: 12 (ref 5–15)
BUN: 30 mg/dL — AB (ref 6–23)
CALCIUM: 8.5 mg/dL (ref 8.4–10.5)
CO2: 32 mEq/L (ref 19–32)
Chloride: 98 mEq/L (ref 96–112)
Creatinine, Ser: 2.61 mg/dL — ABNORMAL HIGH (ref 0.50–1.10)
GFR, EST AFRICAN AMERICAN: 20 mL/min — AB (ref >90)
GFR, EST NON AFRICAN AMERICAN: 17 mL/min — AB (ref >90)
Glucose, Bld: 129 mg/dL — ABNORMAL HIGH (ref 70–99)
POTASSIUM: 4.3 meq/L (ref 3.7–5.3)
Sodium: 142 mEq/L (ref 137–147)

## 2014-01-04 LAB — GLUCOSE, CAPILLARY
GLUCOSE-CAPILLARY: 128 mg/dL — AB (ref 70–99)
GLUCOSE-CAPILLARY: 119 mg/dL — AB (ref 70–99)
Glucose-Capillary: 117 mg/dL — ABNORMAL HIGH (ref 70–99)

## 2014-01-04 MED ORDER — ENSURE PUDDING PO PUDG
1.0000 | Freq: Two times a day (BID) | ORAL | Status: DC
  Administered 2014-01-05 (×2): 1 via ORAL
  Filled 2014-01-04 (×7): qty 1

## 2014-01-04 NOTE — Progress Notes (Signed)
Assessment: 1. Acute renal failure (AKI) - probable acute contrast nephropathy (11/28 w CT scan of abdomen), UTI, CHF 2. UTI / urine retention - foley in place,  3. Vol overload / anasarca - improved. Will stop furosemide  Subjective: Interval History: good UOP with diuretics  Objective: Vital signs in last 24 hours: Temp:  [97.8 F (36.6 [name])-98.1 F (36.7 [name])] 97.8 F (36.6 [name]) (12/07 1340) Pulse Rate:  [64-68] 64 (12/07 1340) Resp:  [14-18] 16 (12/07 1340) BP: (112-147)/(52-77) 140/60 mmHg (12/07 1340) SpO2:  [99 %-100 %] 99 % (12/07 1340) Weight:  [90.2 kg (198 lb 13.7 oz)] 90.2 kg (198 lb 13.7 oz) (12/07 0425) Weight change: -4.9 kg (-10 lb 12.8 oz)  Intake/Output from previous day: 12/06 0701 - 12/07 0700 In: 800 [P.O.:720; I.V.:80] Out: 3300 [Urine:3300] Intake/Output this shift: Total I/O In: 360 [P.O.:360] Out: 1450 [Urine:1450]  General appearance: alert and cooperative, no oxygen Neck: no JVD and thyroid not enlarged, symmetric, no tenderness/mass/nodules GI: soft, mild ruq tenderness; bowel sounds normal; no masses,  no organomegaly Extremities: brace RLE , no edema  Lab Results: No results for input(s): WBC, HGB, HCT, PLT in the last 72 hours. BMET:  Recent Labs  01/03/14 0847 01/04/14 0403  NA 144 142  K 4.3 4.3  CL 101 98  CO2 31 32  GLUCOSE 98 129*  BUN 31* 30*  CREATININE 2.79* 2.61*  CALCIUM 8.7 8.5   No results for input(s): PTH in the last 72 hours. Iron Studies: No results for input(s): IRON, TIBC, TRANSFERRIN, FERRITIN in the last 72 hours. Studies/Results: No results found.  Scheduled: . aspirin EC  325 mg Oral Daily  . carvedilol  3.125 mg Oral BID WC  . enoxaparin (LOVENOX) injection  30 mg Subcutaneous QHS  . feeding supplement (ENSURE)  1 Container Oral BID BM  . furosemide  80 mg Intravenous Q12H  . insulin aspart  0-15 Units Subcutaneous TID WC  . insulin aspart  0-5 Units Subcutaneous QHS  . metoCLOPramide  5 mg Oral TID AC &  HS  . simvastatin  20 mg Oral Daily  . sodium chloride  3 mL Intravenous Q12H  . timolol  1 drop Left Eye BID     LOS: 9 days   [name] 01/04/2014,2:55 PM

## 2014-01-04 NOTE — Progress Notes (Signed)
INITIAL NUTRITION ASSESSMENT  DOCUMENTATION CODES Per approved criteria  -Obesity Unspecified   INTERVENTION: - Magic cup TID with meals, each supplement provides 290 kcal and 9 grams of protein - Ensure Pudding po BID, each supplement provides 170 kcal and 4 grams of protein  NUTRITION DIAGNOSIS: Inadequate oral intake related to dementia as evidenced by poor po intake.   Goal: Pt to meet >/= 90% of their estimated nutrition needs   Monitor:  Weight trend, po intake, acceptance of supplements, labs  Reason for Assessment: Malnutrition Screening Tool/Low Braden Score  73 y.o. female  Admitting Dx: <principal problem not specified>  ASSESSMENT: [facility] and confusion CXR showed mild CHF Na was noted to be up to 152. Patient is legally blind.  - Pt confused per nurse tech. Spoke with Nurse Tech who said that pt only ate pears for lunch and 2 slices of bacon for breakfast. Pt is a picky eater and Nurse Tech ordered a new tray for pt in hopes that she would be able to find something she likes. RD to order nutritional supplements.  - Pt with no signs of fat or muscle wasting.   Height: Ht Readings from Last 1 Encounters:  12/26/13 5\' 2"  (1.575 m)    Weight: Wt Readings from Last 1 Encounters:  01/04/14 198 lb 13.7 oz (90.2 kg)    Ideal Body Weight: 110 lbs  % Ideal Body Weight: 180%  Wt Readings from Last 10 Encounters:  01/04/14 198 lb 13.7 oz (90.2 kg)  12/23/13 199 lb (90.266 kg)  12/22/13 212 lb 4.8 oz (96.299 kg)  09/30/13 209 lb (94.802 kg)  09/16/13 219 lb (99.338 kg)  09/07/13 217 lb (98.431 kg)  07/15/13 221 lb (100.245 kg)  01/28/13 207 lb (93.895 kg)  07/02/12 225 lb (102.059 kg)  06/27/12 219 lb (99.338 kg)    BMI:  Body mass index is 36.36 kg/(m^2).  Estimated Nutritional Needs: Kcal: 1500-1750 Protein: 95-105 g Fluid: 1.8 L/day L/day  Skin: stage II wound on coccyx  Diet Order: Diet Carb  Modified  EDUCATION NEEDS: -Education not appropriate at this time   Intake/Output Summary (Last 24 hours) at 01/04/14 1307 Last data filed at 01/04/14 1004  Gross per 24 hour  Intake    680 ml  Output   3100 ml  Net  -2420 ml    Last BM: 12/5   Labs:   Recent Labs Lab 01/02/14 0425 01/03/14 0847 01/04/14 0403  NA 142 144 142  K 4.2 4.3 4.3  CL 102 101 98  CO2 26 31 32  BUN 31* 31* 30*  CREATININE 2.90* 2.79* 2.61*  CALCIUM 8.6 8.7 8.5  GLUCOSE 85 98 129*    CBG (last 3)   Recent Labs  01/04/14 0431 01/04/14 0736 01/04/14 1201  GLUCAP 128* 117* 119*    Scheduled Meds: . aspirin EC  325 mg Oral Daily  . carvedilol  3.125 mg Oral BID WC  . enoxaparin (LOVENOX) injection  30 mg Subcutaneous QHS  . furosemide  80 mg Intravenous Q12H  . insulin aspart  0-15 Units Subcutaneous TID WC  . insulin aspart  0-5 Units Subcutaneous QHS  . metoCLOPramide  5 mg Oral TID AC & HS  . simvastatin  20 mg Oral Daily  . sodium chloride  3 mL Intravenous Q12H  . timolol  1 drop Left Eye BID    Continuous Infusions: . dextrose 5 % and 0.9% NaCl 10 mL/hr  at 01/01/14 2301    Past Medical History  Diagnosis Date  . Hypertension   . Diabetes mellitus   . Blindness   . TIA (transient ischemic attack)   . Anemia   . GERD (gastroesophageal reflux disease)   . Constipation   . Renal disorder 05/2012    acute Renal Failure    Past Surgical History  Procedure Laterality Date  . Gallbladder surgery    . Esophageal dilation    . Cholecystectomy    . Amputation  02/18/2012    Procedure: AMPUTATION RAY;  Surgeon: [name], MD;  Location: [facility];  Service: Orthopedics;  Laterality: Right;  right second toe  . Tee without cardioversion  02/21/2012    Procedure: TRANSESOPHAGEAL ECHOCARDIOGRAM (TEE);  Surgeon: [name], MD;  Location: [facility] ENDOSCOPY;  Service: Cardiovascular;  Laterality: N/A;   spoke with [name] from [place] pt will arrive by 815ish( thy change shifts  at [facility])  . Tee without cardioversion  02/25/2012    Procedure: TRANSESOPHAGEAL ECHOCARDIOGRAM (TEE);  Surgeon: [name], MD;  Location: [place];  Service: Cardiovascular;  Laterality: N/A;  . Amputation Right 09/07/2013    Procedure: AMPUTATION RAY;  Surgeon: [name], MD;  Location: [place];  Service: Orthopedics;  Laterality: Right;  Right Transmetatarsal Amputation    Laurette Schimke MS, RD, LDN

## 2014-01-04 NOTE — Progress Notes (Signed)
Clinical Social Work  Patient was discussed during progression meeting and MD reports no DC today. CSW updated [facility] who remains agreeable to accept whenever medically stable.  Sindy Messing, LCSW (Coverage for [employer])

## 2014-01-04 NOTE — Progress Notes (Signed)
[facility] PROGRESS NOTE  [name] JOA:[id number] DOB: Jun 05, 1940 DOA: 12/26/2013 PCP: Andrea Kinnier, DO  Interim summary:  73 [facility]  [place] in with mild hypoxia and confusion , CXR showed mild CHF and labs revealed hypernatremia. UA was noted to have evidence of urinary tract infection, she has recently been admitted with Klebsiella pneumoniae UTI and finished a course of ertapenem. She was admitted to hospitalist service for management of CHF and hypernatremia and possible UTI.  On arrival to ED, she was found to be hypotensive and initially admitted to step down for closer monitoring.   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 As her creatinine was increasing and her urine output decreased to less than 542ml, The lasix was held and she was given some fluid boluses and started on gentle hydration. Her urine output improved and her fluids stopped.  I/O last 3 completed shifts: In: 1165.8 [P.O.:960; I.V.:205.8] Out: 5300 [Urine:5300] Total I/O In: 360 [P.O.:360] Out: 1450 [Urine:1450] .   Resume coreg.  IV Lasix started with good diuretic response. 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   Urinary retention: Chronic foley catheter in place  Diabetes Mellitus: Resume SSI.  Last hgba1c is 6.2  Hypernatremia:  Resolved.   Anemia: Baseline around 10 and stable.   Klebsiella UTI: Last cultures are ESBL and to complete INVANZ till 12/4  Legally  blind: Resides at [facility].   Acute encephalopathy Resolved Unclear etiology. Abg shows pco2 of 51 and Po2 of 140.  CT head without contrast was ordered, negative for acute pathology..    Code Status: full code.  Family Communication: none at bedside.  Disposition Plan: possibly back to SNF in 1 to 2 days.    Consultants:  none  Procedures:  none  Antibiotics:  Invanz   Rocephin one dose  HPI/Subjective: Patient seen and examined, denies shortness of breath. Cr is now slowly improving. She is more alert today.  Objective: Filed Vitals:   01/04/14 1340  BP: 140/60  Pulse: 64  Temp: 97.8 F (36.6 C)  Resp: 16    Intake/Output Summary (Last 24 hours) at 01/04/14 1348 Last data filed at 01/04/14 1341  Gross per 24 hour  Intake    920 ml  Output   3750 ml  Net  -2830 ml   Filed Weights   01/02/14 0500 01/03/14 0510 01/04/14 0425  Weight: 98.2 kg (216 lb 7.9 oz) 95.1 kg (209 lb 10.5 oz) 90.2 kg (198 lb 13.7 oz)    Exam:  Physical Exam: Lungs: Normal respiratory effort, bilateral clear to auscultation, no crackles or wheezes.  Heart: Regular rate and rhythm, S1 and S2 normal, no murmurs, rubs auscultated Abdomen: BS normoactive,soft,nondistended,non-tender to palpation,no organomegaly Extremities: No pretibial edema, no erythema, no cyanosis, no clubbing Neuro : Alert, oriented x 3   Data Reviewed: Basic Metabolic Panel:  Recent Labs Lab 12/29/13 0455 12/30/13 0600 12/31/13 0500 01/01/14 0412 01/02/14 0425 01/03/14 0847 01/04/14 0403  NA 143 142  --   --  142 144 142  K 4.2 4.0  --   --  4.2 4.3 4.3  CL 103 103  --   --  102 101 98  CO2 30 26  --   --  26 31 32  GLUCOSE 113* 120*  --   --  85 98 129*  BUN 23 24*  --   --  31* 31* 30*  CREATININE 1.66* 2.10* 2.69* 3.09* 2.90* 2.79* 2.61*  CALCIUM 8.4 8.3*  --   --  8.6 8.7 8.5   Liver Function Tests:  Recent Labs Lab 12/28/13 2012  AST 22  ALT 8  ALKPHOS 119*  BILITOT 0.3  PROT 6.7   ALBUMIN 2.3*   No results for input(s): LIPASE, AMYLASE in the last 168 hours.  Recent Labs Lab 12/28/13 1705  AMMONIA 26   CBC:  Recent Labs Lab 12/29/13 1130  WBC 8.8  HGB 10.2*  HCT 32.9*  MCV 91.1  PLT 290   Cardiac Enzymes: No results for input(s): CKTOTAL, CKMB, CKMBINDEX, TROPONINI in the last 168 hours. BNP (last 3 results)  Recent Labs  12/26/13 1640 12/27/13 0040  PROBNP 5360.0* 4645.0*   CBG:  Recent Labs Lab 01/03/14 1720 01/03/14 2116 01/04/14 0431 01/04/14 0736 01/04/14 1201  GLUCAP 117* 140* 128* 117* 119*    Recent Results (from the past 240 hour(s))  MRSA PCR Screening     Status: None   Collection Time: 12/26/13 11:20 PM  Result Value Ref Range Status   MRSA by PCR NEGATIVE NEGATIVE Final    Comment:        The GeneXpert MRSA Assay (FDA approved for NASAL specimens only), is one component of a comprehensive MRSA colonization surveillance program. It is not intended to diagnose MRSA infection nor to guide or monitor treatment for MRSA infections.      Studies: No results found.  Scheduled Meds: . aspirin EC  325 mg Oral Daily  . carvedilol  3.125 mg Oral BID WC  . enoxaparin (LOVENOX) injection  30 mg Subcutaneous QHS  . feeding supplement (ENSURE)  1 Container Oral BID BM  . furosemide  80 mg Intravenous Q12H  . insulin aspart  0-15 Units Subcutaneous TID WC  . insulin aspart  0-5 Units Subcutaneous QHS  . metoCLOPramide  5 mg Oral TID AC & HS  . simvastatin  20 mg Oral Daily  . sodium chloride  3 mL Intravenous Q12H  . timolol  1 drop Left Eye BID   Continuous Infusions: . dextrose 5 % and 0.9% NaCl 10 mL/hr at 01/01/14 2301    Active Problems:   Type 2 diabetes, controlled, with peripheral circulatory disorder   Essential hypertension   Anemia   UTI (lower urinary tract infection)   Hypernatremia   Hypoxic   CHF (congestive heart failure)   Systolic and diastolic CHF, acute on chronic   AKI (acute kidney  injury)    Time spent: 25  minutes    [facility] Pager [phone] If 7PM-7AM, please contact night-coverage at [email or web address], password [facility] 01/04/2014, 1:48 PM  LOS: 9 days

## 2014-01-04 NOTE — Plan of Care (Signed)
Problem: Phase III Progression Outcomes Goal: Other Phase III Outcomes/Goals Outcome: Completed/Met Date Met:  01/04/14

## 2014-01-05 LAB — BASIC METABOLIC PANEL
Anion gap: 12 (ref 5–15)
BUN: 28 mg/dL — ABNORMAL HIGH (ref 6–23)
CO2: 32 mEq/L (ref 19–32)
Calcium: 8.8 mg/dL (ref 8.4–10.5)
Chloride: 96 mEq/L (ref 96–112)
Creatinine, Ser: 2.46 mg/dL — ABNORMAL HIGH (ref 0.50–1.10)
GFR, EST AFRICAN AMERICAN: 22 mL/min — AB (ref >90)
GFR, EST NON AFRICAN AMERICAN: 19 mL/min — AB (ref >90)
Glucose, Bld: 79 mg/dL (ref 70–99)
POTASSIUM: 4.2 meq/L (ref 3.7–5.3)
SODIUM: 140 meq/L (ref 137–147)

## 2014-01-05 LAB — GLUCOSE, CAPILLARY
GLUCOSE-CAPILLARY: 131 mg/dL — AB (ref 70–99)
GLUCOSE-CAPILLARY: 91 mg/dL (ref 70–99)
Glucose-Capillary: 111 mg/dL — ABNORMAL HIGH (ref 70–99)
Glucose-Capillary: 101 mg/dL — ABNORMAL HIGH (ref 70–99)
Glucose-Capillary: 71 mg/dL (ref 70–99)
Glucose-Capillary: 100 mg/dL — ABNORMAL HIGH (ref 70–99)
Glucose-Capillary: 127 mg/dL — ABNORMAL HIGH (ref 70–99)
Glucose-Capillary: 76 mg/dL (ref 70–99)
Glucose-Capillary: 84 mg/dL (ref 70–99)

## 2014-01-05 NOTE — Progress Notes (Signed)
[facility] PROGRESS NOTE  [name] GYJ:[id number] DOB: Apr 21, 1940 DOA: 12/26/2013 PCP: Hollace Kinnier, DO  Interim summary:  73 [facility] from [facility]  [place] in with mild hypoxia and confusion , CXR showed mild CHF and labs revealed hypernatremia. UA was noted to have evidence of urinary tract infection, she has recently been admitted with Klebsiella pneumoniae UTI and finished a course of ertapenem. She was admitted to hospitalist service for management of CHF and hypernatremia and possible UTI.  On arrival to ED, she was found to be hypotensive and initially admitted to step down for closer monitoring. Patient developed worsening renal failure secondary to contrast-induced nephropathy and dehydration. Nephrology was consulted, and started diuretics. Patient's creatinine improved after high-dose Lasix. At this time Lasix has been discontinued by the nephrology and creatinine is slowly improving. Patient also completed the 14 day course of Invanz for the capsule and pneumonia UTI. Patient has baseline intermittent confusion, and has hallucinations. Which is at her baseline as per the nursing home staff. Patient has good by mouth intake.  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 As her creatinine was increasing and her urine output decreased to less than 551ml, The lasix was held and she was given some fluid boluses and started on gentle hydration. Her urine output improved and her fluids stopped.  I/O last 3 completed shifts: In: 2637 [P.O.:1320; I.V.:400] Out: 4950 [Urine:4950] Total I/O In: 170 [P.O.:170] Out: 700 [Urine:700] .   Resume coreg.  IV Lasix started with good diuretic response.   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 Nephrology following in consultation  Status post transmetatarsal amputation of right foot;  Wound care consulted.   Urinary retention: Chronic foley catheter in place  Diabetes Mellitus: Resume SSI.  Last hgba1c is 6.2  Hypernatremia:  Resolved.   Anemia: Baseline around 10 and stable.   Klebsiella UTI: Last cultures are ESBL and to complete INVANZ till 12/4  Legally blind: Resides at [facility].   Acute encephalopathy Resolved patient has intermittent confusion which is at her baseline as per the nursing home staff. CT head without contrast was ordered, negative for acute pathology..    Code Status: full code.  Family Communication: none at bedside.  Disposition Plan: possibly back to SNF in 1 to 2 days.    Consultants:  none  Procedures:  none  Antibiotics:  Invanz   Rocephin one dose  HPI/Subjective: Patient seen and examined, denies shortness of breath. Cr is now slowly improving. She is more alert today. Has intermittent episodes of hallucinations.  Objective: Filed Vitals:   01/05/14 1416  BP: 175/78  Pulse: 62  Temp: 98.2 F (36.8 C)  Resp: 19    Intake/Output Summary (Last 24 hours) at 01/05/14 1603 Last data filed at 01/05/14 1419  Gross per 24 hour  Intake    970 ml  Output   2700 ml  Net  -1730 ml   Filed Weights   01/03/14 0510 01/04/14 0425 01/05/14 0600  Weight: 95.1 kg (209 lb 10.5 oz)  90.2 kg (198 lb 13.7 oz) 88.1 kg (194 lb 3.6 oz)    Exam:  Physical Exam: Lungs: Normal respiratory effort, bilateral clear to auscultation, no crackles or wheezes.  Heart: Regular rate and rhythm, S1 and S2 normal, no murmurs, rubs auscultated Abdomen: BS normoactive,soft,nondistended,non-tender to palpation,no  organomegaly Extremities: No pretibial edema, no erythema, no cyanosis, no clubbing Neuro : Confused this morning   Data Reviewed: Basic Metabolic Panel:  Recent Labs Lab 12/30/13 0600  01/01/14 0412 01/02/14 0425 01/03/14 0847 01/04/14 0403 01/05/14 0450  NA 142  --   --  142 144 142 140  K 4.0  --   --  4.2 4.3 4.3 4.2  CL 103  --   --  102 101 98 96  CO2 26  --   --  26 31 32 32  GLUCOSE 120*  --   --  85 98 129* 79  BUN 24*  --   --  31* 31* 30* 28*  CREATININE 2.10*  < > 3.09* 2.90* 2.79* 2.61* 2.46*  CALCIUM 8.3*  --   --  8.6 8.7 8.5 8.8  < > = values in this interval not displayed. Liver Function Tests: No results for input(s): AST, ALT, ALKPHOS, BILITOT, PROT, ALBUMIN in the last 168 hours. No results for input(s): LIPASE, AMYLASE in the last 168 hours. No results for input(s): AMMONIA in the last 168 hours. CBC: No results for input(s): WBC, NEUTROABS, HGB, HCT, MCV, PLT in the last 168 hours. Cardiac Enzymes: No results for input(s): CKTOTAL, CKMB, CKMBINDEX, TROPONINI in the last 168 hours. BNP (last 3 results)  Recent Labs  12/26/13 1640 12/27/13 0040  PROBNP 5360.0* 4645.0*   CBG:  Recent Labs Lab 01/04/14 2127 01/05/14 0008 01/05/14 0556 01/05/14 0718 01/05/14 1142  GLUCAP 101* 91 71 100* 127*    Recent Results (from the past 240 hour(s))  MRSA PCR Screening     Status: None   Collection Time: 12/26/13 11:20 PM  Result Value Ref Range Status   MRSA by PCR NEGATIVE NEGATIVE Final    Comment:        The GeneXpert MRSA Assay (FDA approved for NASAL specimens only), is one component of a comprehensive MRSA colonization surveillance program. It is not intended to diagnose MRSA infection nor to guide or monitor treatment for MRSA infections.      Studies: No results found.  Scheduled Meds: . aspirin EC  325 mg Oral Daily  . carvedilol  3.125 mg Oral BID WC  . enoxaparin (LOVENOX) injection  30 mg Subcutaneous QHS  . feeding  supplement (ENSURE)  1 Container Oral BID BM  . insulin aspart  0-15 Units Subcutaneous TID WC  . insulin aspart  0-5 Units Subcutaneous QHS  . metoCLOPramide  5 mg Oral TID AC & HS  . simvastatin  20 mg Oral Daily  . sodium chloride  3 mL Intravenous Q12H  . timolol  1 drop Left Eye BID   Continuous Infusions: . dextrose 5 % and 0.9% NaCl 10 mL/hr at 01/01/14 2301    Active Problems:   Type 2 diabetes, controlled, with peripheral circulatory disorder   Essential hypertension   Anemia   UTI (lower urinary tract infection)   Hypernatremia   Hypoxic   CHF (congestive heart failure)   Systolic and diastolic CHF, acute on chronic   AKI (acute kidney injury)    Time spent: 25  minutes    [facility] Pager [phone] If  7PM-7AM, please contact night-coverage at [email or web address], password [facility] 01/05/2014, 4:03 PM  LOS: 10 days

## 2014-01-05 NOTE — Progress Notes (Signed)
Assessment: 1. Acute renal failure (AKI) with slow recovery - probable acute contrast nephropathy (11/28 w CT scan of abdomen), UTI, CHF 2. UTI / urine retention - foley in place,  3. Vol overload / anasarca - improved.  Plan: We are not adding to current care.  Suggest maintain off diuretics for now and follow creatinine a couple of times per week until at baseline. We will sign off. Please call prn.  Subjective: Interval History: None  Objective: Vital signs in last 24 hours: Temp:  [97.8 F (36.6 C)-98.4 F (36.9 C)] 98.1 F (36.7 C) (12/08 0600) Pulse Rate:  [62-65] 65 (12/08 0600) Resp:  [16-20] 20 (12/08 0600) BP: (113-154)/(51-74) 154/51 mmHg (12/08 0600) SpO2:  [98 %-100 %] 98 % (12/08 0600) Weight:  [88.1 kg (194 lb 3.6 oz)] 88.1 kg (194 lb 3.6 oz) (12/08 0600) Weight change: -2.1 kg (-4 lb 10.1 oz)  Intake/Output from previous day: 12/07 0701 - 12/08 0700 In: 1240 [P.O.:840; I.V.:400] Out: 3450 [Urine:3450] Intake/Output this shift: Total I/O In: 170 [P.O.:170] Out: -   General appearance: alert and cooperative Extremities: extremities normal, atraumatic, no cyanosis or edema  Lab Results: No results for input(s): WBC, HGB, HCT, PLT in the last 72 hours. BMET:  Recent Labs  01/04/14 0403 01/05/14 0450  NA 142 140  K 4.3 4.2  CL 98 96  CO2 32 32  GLUCOSE 129* 79  BUN 30* 28*  CREATININE 2.61* 2.46*  CALCIUM 8.5 8.8   No results for input(s): PTH in the last 72 hours. Iron Studies: No results for input(s): IRON, TIBC, TRANSFERRIN, FERRITIN in the last 72 hours. Studies/Results: No results found.  Scheduled: . aspirin EC  325 mg Oral Daily  . carvedilol  3.125 mg Oral BID WC  . enoxaparin (LOVENOX) injection  30 mg Subcutaneous QHS  . feeding supplement (ENSURE)  1 Container Oral BID BM  . insulin aspart  0-15 Units Subcutaneous TID WC  . insulin aspart  0-5 Units Subcutaneous QHS  . metoCLOPramide  5 mg Oral TID AC & HS  . simvastatin  20 mg  Oral Daily  . sodium chloride  3 mL Intravenous Q12H  . timolol  1 drop Left Eye BID     LOS: 10 days   [name] C 01/05/2014,1:33 PM

## 2014-01-06 DIAGNOSIS — N189 Chronic kidney disease, unspecified: Secondary | ICD-10-CM

## 2014-01-06 DIAGNOSIS — N179 Acute kidney failure, unspecified: Secondary | ICD-10-CM | POA: Insufficient documentation

## 2014-01-06 DIAGNOSIS — I5043 Acute on chronic combined systolic (congestive) and diastolic (congestive) heart failure: Secondary | ICD-10-CM

## 2014-01-06 DIAGNOSIS — G9340 Encephalopathy, unspecified: Secondary | ICD-10-CM

## 2014-01-06 LAB — CBC
HEMATOCRIT: 32.9 % — AB (ref 36.0–46.0)
Hemoglobin: 10.4 g/dL — ABNORMAL LOW (ref 12.0–15.0)
MCH: 28.7 pg (ref 26.0–34.0)
MCHC: 31.6 g/dL (ref 30.0–36.0)
MCV: 90.6 fL (ref 78.0–100.0)
PLATELETS: 291 10*3/uL (ref 150–400)
RBC: 3.63 MIL/uL — AB (ref 3.87–5.11)
RDW: 16.7 % — ABNORMAL HIGH (ref 11.5–15.5)
WBC: 7.4 10*3/uL (ref 4.0–10.5)

## 2014-01-06 LAB — GLUCOSE, CAPILLARY
GLUCOSE-CAPILLARY: 67 mg/dL — AB (ref 70–99)
GLUCOSE-CAPILLARY: 76 mg/dL (ref 70–99)
GLUCOSE-CAPILLARY: 82 mg/dL (ref 70–99)
GLUCOSE-CAPILLARY: 89 mg/dL (ref 70–99)
GLUCOSE-CAPILLARY: 95 mg/dL (ref 70–99)
Glucose-Capillary: 105 mg/dL — ABNORMAL HIGH (ref 70–99)
Glucose-Capillary: 69 mg/dL — ABNORMAL LOW (ref 70–99)
Glucose-Capillary: 80 mg/dL (ref 70–99)
Glucose-Capillary: 92 mg/dL (ref 70–99)

## 2014-01-06 LAB — BASIC METABOLIC PANEL
Anion gap: 11 (ref 5–15)
BUN: 25 mg/dL — AB (ref 6–23)
CO2: 31 meq/L (ref 19–32)
Calcium: 8.9 mg/dL (ref 8.4–10.5)
Chloride: 95 mEq/L — ABNORMAL LOW (ref 96–112)
Creatinine, Ser: 2.17 mg/dL — ABNORMAL HIGH (ref 0.50–1.10)
GFR calc Af Amer: 25 mL/min — ABNORMAL LOW (ref >90)
GFR, EST NON AFRICAN AMERICAN: 22 mL/min — AB (ref >90)
GLUCOSE: 100 mg/dL — AB (ref 70–99)
POTASSIUM: 4 meq/L (ref 3.7–5.3)
Sodium: 137 mEq/L (ref 137–147)

## 2014-01-06 NOTE — Plan of Care (Signed)
Problem: Discharge Progression Outcomes Goal: Discharge plan in place and appropriate Outcome: Completed/Met Date Met:  01/06/14     

## 2014-01-06 NOTE — Progress Notes (Signed)
PROGRESS NOTE  [name] OFB:[id number] DOB: September 21, 1940 DOA: 12/26/2013 PCP: Hollace Kinnier, DO Interim summary: 73 year old lady from [facility] in with mild hypoxia and confusion , CXR showed mild CHF and labs revealed hypernatremia. UA was noted to have evidence of urinary tract infection, she has recently been admitted with Klebsiella pneumoniae UTI and finished a course of ertapenem. She was admitted to hospitalist service for management of CHF and hypernatremia and possible UTI. On arrival to ED, she was found to be hypotensive and initially admitted to step down for closer monitoring. Patient developed worsening renal failure secondary to contrast-induced nephropathy and dehydration. Nephrology was consulted, and started diuretics. Patient's creatinine improved after high-dose Lasix. At this time Lasix has been discontinued by the nephrology and creatinine is slowly improving. Patient also completed the 14 day course of Invanz for the capsule and pneumonia UTI. Patient has baseline intermittent confusion, and has hallucinations. Which is at her baseline as per the nursing home staff. Patient has good by mouth intake. Assessment/Plan: acute on chronic systolic and diastolic heart failure: - mild decompensation on admission and was given a dose of lasix 40 mg on admission an dlater on started on po lasix 40 mg BId. As her creatinine was increasing and her urine output decreased to less than 575ml, The lasix was held and she was given some fluid boluses and started on gentle hydration. Her urine output improved and her fluids stopped.   Resume coreg.  IV initially Lasix started with good diuretic response. 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 Nephrology following in consultation--signed off 01/05/14 -remain off furosemide for now -repeat BMP in 1 week  Status post transmetatarsal amputation of right foot;  Wound care consulted.  -Wound is not infected on exam  Urinary retention: Chronic foley catheter in place  Diabetes Mellitus: Resume SSI.  Last hgba1c is 6.2  Hypernatremia:  Resolved.   Anemia: Baseline around 10 and stable.   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.   Acute encephalopathy Resolved patient has intermittent confusion which is at her baseline as per the nursing home staff. CT head without contrast was ordered, negative for acute pathology..    Code Status: full code.      Family Communication:   Pt at beside Disposition Plan:   SNF 12/10 if stable        Procedures/Studies: Dg Chest 2 View  12/29/2013   CLINICAL DATA:  Aspiration pneumonia, history diabetes, hypertension  EXAM: CHEST  2 VIEW  COMPARISON:  12/26/2013  FINDINGS: RIGHT arm PICC line tip projects over SVC near cavoatrial junction.  Enlargement of cardiac silhouette with slight pulmonary vascular congestion.  Mediastinal contours normal.  Peribronchial thickening.  Posterior density at the lung bases on lateral view could represent pleural effusion or lower lobe consolidation, potentially in the RIGHT lower lobe.  Upper lungs clear.  No pneumothorax.  Bones demineralized.  IMPRESSION: Bronchitic changes with posterior density at the lung bases on lateral view question pleural effusions, cannot exclude RIGHT lower lobe consolidation.  Electronically Signed   By: Lavonia Dana M.D.   On: 12/29/2013 14:17   Dg Chest 2 View  12/26/2013   CLINICAL DATA:  Altered mental status, shortness of breath, weakness  EXAM: CHEST  2 VIEW  COMPARISON:  12/22/2013   FINDINGS: Cardiomegaly is noted. Mild interstitial prominence bilaterally suspicious for mild interstitial edema. No segmental infiltrate. Stable right arm PICC line position with tip in right atrium.  IMPRESSION: Cardiomegaly. Mild interstitial prominence bilaterally suspicious for mild interstitial edema. No segmental infiltrate   Electronically Signed   By: Lahoma Crocker M.D.   On: 12/26/2013 16:06   Ct Head Wo Contrast  12/28/2013   CLINICAL DATA:  Acute encephalopathy.  Increased lethargy.  EXAM: CT HEAD WITHOUT CONTRAST  TECHNIQUE: Contiguous axial images were obtained from the base of the skull through the vertex without contrast.  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 Stable low-density in the white matter. Stable encephalomalacia in the right frontal lobe. Stable focal low-density in the left frontal cortex. No evidence for acute hemorrhage, mass lesion, midline shift or new large infarct. Again noted are calcifications in both globes, right side greater the left. Fluid in the ethmoid air cells, left side greater than right. No acute bone abnormality.  IMPRESSION: No acute intracranial abnormality.  Stable hydrocephalus.  Minimal change since 12/18/2013.  Evidence for chronic small vessel ischemic changes and old insult in the right frontal lobe.   Electronically Signed   By: Markus Daft M.D.   On: 12/28/2013 23:27   Ct Head Wo Contrast  12/18/2013   CLINICAL DATA:  Altered mental status. Nursing home patient, hypoglycemia and unresponsive.  EXAM: CT HEAD WITHOUT CONTRAST  TECHNIQUE: Contiguous axial images were obtained from the base of the skull through the vertex without intravenous contrast.  COMPARISON:  CT of the head February 14, 2012  FINDINGS: Severe hydrocephalus, with ballooning of the temporal horns, unchanged. Anterior recess of the third ventricle is 12 mm, unchanged. Similar periventricular white matter  hypodensity. Patchy bilateral thalamus hypodensity is unchanged without mass effect, midline shift. High RIGHT frontal transcortical encephalomalacia. No acute large vascular territory infarct. No hemorrhage.  No abnormal extra-axial fluid collections. Basal cisterns are patent. Severe calcific atherosclerosis of the carotid bulbs.  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 No paranasal sinus air-fluid levels. LEFT middle ear and mastoid effusion, trace RIGHT mastoid effusion.  IMPRESSION: No acute intracranial process.  Stable severe hydrocephalus. Similar periventricular white matter hypodensities may reflect chronic small vessel ischemic disease and/or transependymal flow cerebral spinal fluid. RIGHT frontal encephalomalacia is likely posttraumatic. Nonspecific stable bilateral thalamic hypodensities.  LEFT middle ear and LEFT greater than RIGHT mastoid effusions.   Electronically Signed   By: Elon Alas   On: 12/18/2013 02:22   Ct Abdomen Pelvis W Contrast  12/26/2013   CLINICAL DATA:  Increased lethargy.  Hypoxia today.  EXAM: CT ABDOMEN AND PELVIS WITH CONTRAST  TECHNIQUE: Multidetector CT imaging of the abdomen and pelvis was performed using the standard protocol following bolus administration of intravenous contrast.  CONTRAST:  78mL OMNIPAQUE IOHEXOL 300 MG/ML SOLN, 162mL OMNIPAQUE IOHEXOL 300 MG/ML SOLN  COMPARISON:  06/18/2012.  FINDINGS: Small bilateral pleural effusions. Mild right lower lobe cylindrical bronchiectasis. Mild bibasilar atelectasis and possible linear scarring. Dense atheromatous coronary artery calcifications.  Unremarkable liver, spleen, pancreas and adrenal glands. Lumbar and thoracic spine degenerative changes and scoliosis.  Cholecystectomy clips. Bilateral renal vascular calcifications. There  is also a 5 mm calculus in the lower pole of the right kidney. No bladder or ureteral calculi and no hydronephrosis. A Foley catheter is present in the urinary  bladder.  Uterine fibroids. No gastrointestinal abnormalities or enlarged lymph nodes. Normal appearing appendix. Normal appearing left ovary. The right ovary is not clearly visualized.  IMPRESSION: 1. No acute abnormality. 2. Bilateral vascular renal calcifications and at least 1 nonobstructing right renal calculus. 3. Small bilateral pleural effusions. 4. Mild right lower lobe cylindrical bronchiectasis. 5. Mild bibasilar atelectasis. 6. Dense coronary artery atheromatous calcifications.   Electronically Signed   By: Enrique Sack M.D.   On: 12/26/2013 20:13   US Renal  12/29/2013   CLINICAL DATA:  73 year old with acute on chronic renal failure  EXAM: RENAL/URINARY TRACT ULTRASOUND COMPLETE  COMPARISON:  09/24/2012  FINDINGS: Right Kidney:  Length: 10.7 cm. Echogenicity within normal limits. No mass or hydronephrosis visualized. No renal stones are identified.  Left Kidney:  Length: 11.6 cm. Echogenicity within normal limits. No mass or hydronephrosis visualized. No renal stones are identified.  Bladder:  The urinary bladder is decompressed.  IMPRESSION: No hydronephrosis or evidence of nephrolithiasis.   Electronically Signed   By: Rosemarie Ax   On: 12/29/2013 14:23   Mr Foot Right Wo Contrast  12/20/2013   CLINICAL DATA:  Osteomyelitis with wound drainage. Prior foot amputation 09/07/2013 at the proximal metatarsal level.  EXAM: MRI OF THE RIGHT FOREFOOT WITHOUT CONTRAST  TECHNIQUE: Multiplanar, multisequence MR imaging was performed. No intravenous contrast was administered.  COMPARISON:  12/18/2013 radiographs  FINDINGS: Despite efforts by the technologist and patient, motion artifact is present on today's exam and could not be eliminated. This reduces exam sensitivity and specificity.  No definite osseous edema to suggest osteomyelitis. Abnormal thin soft tissues along the distal stump margin laterally particulate just anterior to the remaining fourth metatarsal dorsally as on image 7 of series 9  and image 16 of series 3. There may well be some soft tissue breakdown in this vicinity.  No drainable abscess observed. No current compelling findings of gas tracking in the soft tissues of the foot, although gas within the distal soft tissues was a concern on prior conventional radiographs. Some of this appearance was probably due to bandaging along the wound site.  IMPRESSION: 1. No findings of osteomyelitis or abscess, but the soft tissues laterally along the stump appear very thin overlying the distal fourth and fifth metatarsal amputation margins dorsally, possibly from ulceration or soft tissue breakdown.   Electronically Signed   By: Sherryl Barters M.D.   On: 12/20/2013 19:25   Ir Fluoro Guide Cv Line Right  12/22/2013   CLINICAL DATA:  Bacterial infection.  Blind.  EXAM: RIGHT UPPER EXTREMITY PICC LINE PLACEMENT WITH ULTRASOUND AND FLUOROSCOPIC GUIDANCE  FLUOROSCOPY TIME:  36 seconds.  PROCEDURE: The patient was advised of the possible risks and complications and agreed to undergo the procedure. The patient was then brought to the angiographic suite for the procedure.  The right arm was prepped with chlorhexidine, draped in the usual sterile fashion using maximum barrier technique (cap and mask, sterile gown, sterile gloves, large sterile sheet, hand hygiene and cutaneous antisepsis) and infiltrated locally with 1% Lidocaine.  Ultrasound demonstrated patency of the right basilic vein, and this was documented with an image. Under real-time ultrasound guidance, this vein was accessed with a 21 gauge micropuncture needle and image documentation was performed. A 0.018 wire was introduced in to the vein. Over this,  a 5 [place] single lumen Power PICC was advanced to the lower SVC/right atrial junction. Fluoroscopy during the procedure and fluoro spot radiograph confirms appropriate catheter position. The catheter was flushed and covered with a sterile dressing.  COMPLICATIONS: None  LENGTH: 45 cm   IMPRESSION: Successful right arm Power PICC line placement with ultrasound and fluoroscopic guidance. The catheter is ready for use.   Electronically Signed   By: Maryclare Bean M.D.   On: 12/22/2013 14:13   Ir US Guide Vasc Access Right  12/22/2013   CLINICAL DATA:  Bacterial infection.  Blind.  EXAM: RIGHT UPPER EXTREMITY PICC LINE PLACEMENT WITH ULTRASOUND AND FLUOROSCOPIC GUIDANCE  FLUOROSCOPY TIME:  36 seconds.  PROCEDURE: The patient was advised of the possible risks and complications and agreed to undergo the procedure. The patient was then brought to the angiographic suite for the procedure.  The right arm was prepped with chlorhexidine, draped in the usual sterile fashion using maximum barrier technique (cap and mask, sterile gown, sterile gloves, large sterile sheet, hand hygiene and cutaneous antisepsis) and infiltrated locally with 1% Lidocaine.  Ultrasound demonstrated patency of the right basilic vein, and this was documented with an image. Under real-time ultrasound guidance, this vein was accessed with a 21 gauge micropuncture needle and image documentation was performed. A 0.018 wire was introduced in to the vein. Over this, a 5 [place] single lumen Power PICC was advanced to the lower SVC/right atrial junction. Fluoroscopy during the procedure and fluoro spot radiograph confirms appropriate catheter position. The catheter was flushed and covered with a sterile dressing.  COMPLICATIONS: None  LENGTH: 45 cm  IMPRESSION: Successful right arm Power PICC line placement with ultrasound and fluoroscopic guidance. The catheter is ready for use.   Electronically Signed   By: Maryclare Bean M.D.   On: 12/22/2013 14:13   Dg Chest Port 1 View  01/01/2014   CLINICAL DATA:  Pulmonary edema.  EXAM: PORTABLE CHEST - 1 VIEW  COMPARISON:  12/29/2013  FINDINGS: Stable positioning of right-sided PICC line with the tip at the cavoatrial junction. Lung volumes are low bilaterally with evidence of persistent mild  interstitial edema. Bibasilar atelectasis present. The heart size is stable and mildly enlarged.  IMPRESSION: Low lung volumes with increase in bibasilar atelectasis. Mild interstitial edema and stable cardiomegaly.   Electronically Signed   By: Aletta Edouard M.D.   On: 01/01/2014 14:49   Dg Foot Complete Right  12/18/2013   CLINICAL DATA:  Wound drainage, RIGHT foot amputation 09/07/2013, history diabetes, hypertension, chronic renal failure  EXAM: RIGHT FOOT COMPLETE - 3+ VIEW  COMPARISON:  RIGHT foot radiographs and MR RIGHT foot 02/17/2012  FINDINGS: Interval transmetatarsal amputation.  Soft tissue swelling at distal foot stump.  Soft tissue gas identified at the lateral aspect of the distal foot, distal to the fifth metatarsal.  No acute fracture or dislocation.  No definite bone destruction.  Scattered small vessel vascular calcification.  IMPRESSION: Interval transmetatarsal amputation.  Soft tissue gas at lateral aspect of stump distal to the fifth metatarsal bone question related to ulcer or wound breakdown.  No definite bone destruction is seen to suggest osteomyelitis.  However if osteomyelitis remains a clinical concern, recommend MR imaging.   Electronically Signed   By: Lavonia Dana M.D.   On: 12/18/2013 17:07         Subjective: Patient denies fevers, chills, headache, chest pain, dyspnea, nausea, vomiting, diarrhea, abdominal pain, dysuria, hematuria   Objective: Filed Vitals:   01/05/14  1416 01/05/14 2144 01/06/14 0356 01/06/14 1732  BP: 175/78 145/66 109/46 120/60  Pulse: 62 66 70   Temp: 98.2 F (36.8 C) 98.6 F (37 C) 97.7 F (36.5 C)   TempSrc: Oral Oral Oral   Resp: 19 19 19    Height:      Weight:   87.6 kg (193 lb 2 oz)   SpO2: 98% 97% 92%     Intake/Output Summary (Last 24 hours) at 01/06/14 1901 Last data filed at 01/06/14 1735  Gross per 24 hour  Intake    770 ml  Output   1600 ml  Net   -830 ml   Weight change: -0.5 kg (-1 lb 1.6  oz) Exam:   General:  Pt is alert, follows commands appropriately, not in acute distress  HEENT: No icterus, No thrush,  [place]/AT  Cardiovascular: RRR, S1/S2, no rubs, no gallops  Respiratory: CTA bilaterally, no wheezing, no crackles, no rhonchi  Abdomen: Soft/+BS, non tender, non distended, no guarding  Extremities: trace LE edema, No lymphangitis, No petechiae, No rashes, no synovitis  Data Reviewed: Basic Metabolic Panel:  Recent Labs Lab 01/02/14 0425 01/03/14 0847 01/04/14 0403 01/05/14 0450 01/06/14 0455  NA 142 144 142 140 137  K 4.2 4.3 4.3 4.2 4.0  CL 102 101 98 96 95*  CO2 26 31 32 32 31  GLUCOSE 85 98 129* 79 100*  BUN 31* 31* 30* 28* 25*  CREATININE 2.90* 2.79* 2.61* 2.46* 2.17*  CALCIUM 8.6 8.7 8.5 8.8 8.9   Liver Function Tests: No results for input(s): AST, ALT, ALKPHOS, BILITOT, PROT, ALBUMIN in the last 168 hours. No results for input(s): LIPASE, AMYLASE in the last 168 hours. No results for input(s): AMMONIA in the last 168 hours. CBC:  Recent Labs Lab 01/06/14 0455  WBC 7.4  HGB 10.4*  HCT 32.9*  MCV 90.6  PLT 291   Cardiac Enzymes: No results for input(s): CKTOTAL, CKMB, CKMBINDEX, TROPONINI in the last 168 hours. BNP: Invalid input(s): POCBNP CBG:  Recent Labs Lab 01/06/14 0157 01/06/14 0354 01/06/14 0750 01/06/14 1149 01/06/14 1707  GLUCAP 82 92 89 95 80    No results found for this or any previous visit (from the past 240 hour(s)).   Scheduled Meds: . aspirin EC  325 mg Oral Daily  . carvedilol  3.125 mg Oral BID WC  . enoxaparin (LOVENOX) injection  30 mg Subcutaneous QHS  . feeding supplement (ENSURE)  1 Container Oral BID BM  . insulin aspart  0-15 Units Subcutaneous TID WC  . insulin aspart  0-5 Units Subcutaneous QHS  . metoCLOPramide  5 mg Oral TID AC & HS  . simvastatin  20 mg Oral Daily  . sodium chloride  3 mL Intravenous Q12H  . timolol  1 drop Left Eye BID   Continuous Infusions: . dextrose 5 % and 0.9%  NaCl 10 mL/hr at 01/01/14 2301     Mekisha Bittel, DO  [facility] Pager [phone]  If 7PM-7AM, please contact night-coverage [email or web address] Password [email or web address] 01/06/2014, 7:01 PM   LOS: 11 days

## 2014-01-06 NOTE — Plan of Care (Signed)
Problem: Consults Goal: General Medical Patient Education See Patient Education Module for specific education.  Outcome: Progressing Goal: Skin Care Protocol Initiated - if Braden Score 18 or less If consults are not indicated, leave blank or document N/A  Outcome: Completed/Met Date Met:  01/06/14 Goal: Nutrition Consult-if indicated Outcome: Progressing Goal: Diabetes Guidelines if Diabetic/Glucose > 140 If diabetic or lab glucose is > 140 mg/dl - Initiate Diabetes/Hyperglycemia Guidelines & Document Interventions  Outcome: Completed/Met Date Met:  01/06/14  Problem: Phase I Progression Outcomes Goal: OOB as tolerated unless otherwise ordered Outcome: Not Progressing  Problem: Phase III Progression Outcomes Goal: IV/normal saline lock discontinued Outcome: Not Progressing     

## 2014-01-07 LAB — BASIC METABOLIC PANEL
ANION GAP: 11 (ref 5–15)
BUN: 25 mg/dL — ABNORMAL HIGH (ref 6–23)
CO2: 30 meq/L (ref 19–32)
CREATININE: 2.06 mg/dL — AB (ref 0.50–1.10)
Calcium: 8.8 mg/dL (ref 8.4–10.5)
Chloride: 98 mEq/L (ref 96–112)
GFR calc Af Amer: 27 mL/min — ABNORMAL LOW (ref >90)
GFR calc non Af Amer: 23 mL/min — ABNORMAL LOW (ref >90)
Glucose, Bld: 98 mg/dL (ref 70–99)
Potassium: 4.1 mEq/L (ref 3.7–5.3)
Sodium: 139 mEq/L (ref 137–147)

## 2014-01-07 LAB — GLUCOSE, CAPILLARY
GLUCOSE-CAPILLARY: 82 mg/dL (ref 70–99)
GLUCOSE-CAPILLARY: 83 mg/dL (ref 70–99)
GLUCOSE-CAPILLARY: 98 mg/dL (ref 70–99)
Glucose-Capillary: 87 mg/dL (ref 70–99)

## 2014-01-07 MED ORDER — GABAPENTIN 100 MG PO CAPS: 300.0000 mg | ORAL_CAPSULE | Freq: Every day | ORAL | Status: DC

## 2014-01-07 MED ORDER — HYDROCODONE-ACETAMINOPHEN 5-325 MG PO TABS: ORAL_TABLET | ORAL | Status: DC

## 2014-01-07 MED ORDER — ENSURE PUDDING PO PUDG: 1.0000 | Freq: Two times a day (BID) | ORAL | Status: DC

## 2014-01-07 NOTE — Discharge Summary (Signed)
Physician Discharge Summary  [name] EVO:[id number] DOB: 1940-09-11 DOA: 12/26/2013  PCP: Andrea Kinnier, DO  Admit date: 12/26/2013 Discharge date: 01/07/2014  Recommendations for Outpatient Follow-up:  1. Pt will need to follow up with PCP in 2 weeks post discharge 2. Please obtain BMP 01/11/14 and 01/14/14 and restart lasix 20 mg daily when serum creatinine returns to baseline (0.7-1.0)  Discharge Diagnoses:  acute on chronic systolic and diastolic heart failure: - mild decompensation on admission and was given a dose of lasix 40 mg on admission  And on started on po lasix 40 mg bid.  -her creatinine was increasing and her urine output decreased to less than 526ml, -lasix was held and she was given some fluid boluses and started on gentle hydration.  -Her urine output improved and her fluids stopped.   -Resume coreg.  -now stable on room air off diuretics  Acute on chronic renal failure (CKD3) -Improving after diuresis Patient started on high-dose Lasix 80 mg 3 times a day by nephrology checked renal ultrasound: negative for hydronephrosis Creatinine now improving today creatinine is 2.46 Lasix dose reduced to 80 mg IV q 12 hr by nephrology on 01/03/2014 and Lasix was discontinued on 01/04/2014. Nephrology following in consultation--signed off 01/05/14 -remain off furosemide for now -repeat BMP in 1 week -Nephrology recommended remaining off of furosemide until her serum creatinine returns to baseline Status post transmetatarsal amputation of right foot;  Wound care consulted.  -Wound is not infected on exam  Urinary retention: Chronic foley catheter in place  Diabetes Mellitus: Resume SSI.  Last hgba1c is 6.2  Hypernatremia:  Resolved.   Anemia: Baseline around 10 and stable.   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   Acute encephalopathy Resolved patient has intermittent confusion  which is at her baseline as per the nursing home staff. CT head without contrast was ordered, negative for acute pathology.Marland Kitchen   Discharge Condition: stable  Disposition: SNF  Diet:carb modified Wt Readings from Last 3 Encounters:  01/07/14 87.5 kg (192 lb 14.4 oz)  12/23/13 90.266 kg (199 lb)  12/22/13 96.299 kg (212 lb 4.8 oz)    History of present illness:  73 [facility] Brought in with mild hypoxia and confusion , CXR showed mild CHF and labs revealed hypernatremia. UA was noted to have evidence of urinary tract infection, she has recently been admitted with Klebsiella pneumoniae UTI and finished a course of ertapenem. She was admitted to hospitalist service for management of CHF and hypernatremia and possible UTI. On arrival to ED, she was found to be hypotensive and initially admitted to step down for closer monitoring. Patient developed worsening renal failure secondary to contrast-induced nephropathy and dehydration. Nephrology was consulted, and started diuretics. Patient's creatinine improved after high-dose Lasix. At this time Lasix has been discontinued by the nephrology and creatinine is slowly improving. Patient also completed the 14 day course of Invanz for the capsule and pneumonia UTI. Patient has baseline intermittent confusion, and has hallucinations. Which is at her baseline as per the nursing home staff. Patient has good by mouth intake.    Consultants: nephrology Discharge Exam: Filed Vitals:   01/07/14 0532  BP: 132/65  Pulse: 65  Temp: 97.1 F (36.2 C)  Resp: 18   Filed Vitals:   01/06/14 1328 01/06/14 1732 01/06/14 2034 01/07/14 0532  BP: 124/55 120/60 137/56 132/65  Pulse: 67  61 65  Temp: 97.5 F (  36.4 C)  97.2 F (36.2 C) 97.1 F (36.2 C)  TempSrc: Oral  Oral Oral  Resp: 20  19 18   Height:      Weight:    87.5 kg (192 lb 14.4 oz)  SpO2: 98%  100% 100%   General: Alert and awake, NAD, pleasant, cooperative Cardiovascular:  RRR, no rub, no gallop, no S3 Respiratory: CTAB, no wheeze, no rhonchi Abdomen:soft, nontender, nondistended, positive bowel sounds Extremities: No edema, No lymphangitis, no petechiae  Discharge Instructions      Discharge Instructions    Diet - low sodium heart healthy    Complete by:  As directed      Increase activity slowly    Complete by:  As directed             Medication List    STOP taking these medications        ertapenem 1 g in sodium chloride 0.9 % 50 mL     furosemide 40 MG tablet  Commonly known as:  LASIX     sodium chloride 1 G tablet      TAKE these medications        aspirin EC 325 MG tablet  Take 325 mg by mouth daily.     carvedilol 3.125 MG tablet  Commonly known as:  COREG  Take 3.125 mg by mouth 2 (two) times daily with a meal.     collagenase ointment  Commonly known as:  SANTYL  Apply topically daily.     docusate sodium 100 MG capsule  Commonly known as:  COLACE  Take 100 mg by mouth 2 (two) times daily.     DULoxetine 60 MG capsule  Commonly known as:  CYMBALTA  Take 1 capsule (60 mg total) by mouth daily. When completes 2 wks at 30mg      feeding supplement (ENSURE) Pudg  Take 1 Container by mouth 2 (two) times daily between meals.     gabapentin 100 MG capsule  Commonly known as:  NEURONTIN  Take 3 capsules (300 mg total) by mouth at bedtime. Phantom pain     HYDROcodone-acetaminophen 5-325 MG per tablet  Commonly known as:  NORCO/VICODIN  Take one tablet by mouth every 6 hours scheduled for pain     hydrocortisone 2.5 % rectal cream  Commonly known as:  ANUSOL-HC  Place 1 application rectally 2 (two) times daily as needed for hemorrhoids.     insulin aspart 100 UNIT/ML injection  Commonly known as:  novoLOG  - 0-9 Units, Subcutaneous, 3 times daily with meals,   - CBG < 70: implement hypoglycemia protocol  - CBG 70 - 120: 0 units  - CBG 121 - 150: 1 unit  - CBG 151 - 200: 2 units  - CBG 201 - 250: 3  units  - CBG 251 - 300: 5 units  - CBG 301 - 350: 7 units  - CBG 351 - 400: 9 units  - CBG > 400: call MD     ipratropium-albuterol 0.5-2.5 (3) MG/3ML Soln  Commonly known as:  DUONEB  Take 3 mLs by nebulization every 6 (six) hours as needed (wheezing).     metoCLOPramide 5 MG tablet  Commonly known as:  REGLAN  Take 1 tablet (5 mg total) by mouth 4 (four) times daily -  before meals and at bedtime.     multivitamin with minerals tablet  Take 1 tablet by mouth daily.     polyethylene glycol packet  Commonly known as:  MIRALAX / GLYCOLAX  Take 17 g by mouth daily.     promethazine 25 MG tablet  Commonly known as:  PHENERGAN  Take 25 mg by mouth every 6 (six) hours as needed for nausea or vomiting.     protein supplement Powd  Take 1 scoop by mouth 3 (three) times daily with meals.     SIMBRINZA 1-0.2 % Susp  Generic drug:  Brinzolamide-Brimonidine  Place 1 drop into the left eye 2 (two) times daily.     simvastatin 20 MG tablet  Commonly known as:  ZOCOR  Take 20 mg by mouth daily.     sodium chloride 0.65 % Soln nasal spray  Commonly known as:  OCEAN  Place 1 spray into the nose 4 (four) times daily as needed for congestion.     timolol 0.5 % ophthalmic solution  Commonly known as:  TIMOPTIC  Place 1 drop into the left eye 2 (two) times daily.     TYLENOL 325 MG tablet  Generic drug:  acetaminophen  Take 650 mg by mouth every 6 (six) hours as needed for fever.         The results of significant diagnostics from this hospitalization (including imaging, microbiology, ancillary and laboratory) are listed below for reference.    Significant Diagnostic Studies: Dg Chest 2 View  12/29/2013   CLINICAL DATA:  Aspiration pneumonia, history diabetes, hypertension  EXAM: CHEST  2 VIEW  COMPARISON:  12/26/2013  FINDINGS: RIGHT arm PICC line tip projects over SVC near cavoatrial junction.  Enlargement of cardiac silhouette with slight pulmonary vascular congestion.   Mediastinal contours normal.  Peribronchial thickening.  Posterior density at the lung bases on lateral view could represent pleural effusion or lower lobe consolidation, potentially in the RIGHT lower lobe.  Upper lungs clear.  No pneumothorax.  Bones demineralized.  IMPRESSION: Bronchitic changes with posterior density at the lung bases on lateral view question pleural effusions, cannot exclude RIGHT lower lobe consolidation.   Electronically Signed   By: Lavonia Dana M.D.   On: 12/29/2013 14:17   Dg Chest 2 View  12/26/2013   CLINICAL DATA:  Altered mental status, shortness of breath, weakness  EXAM: CHEST  2 VIEW  COMPARISON:  12/22/2013  FINDINGS: Cardiomegaly is noted. Mild interstitial prominence bilaterally suspicious for mild interstitial edema. No segmental infiltrate. Stable right arm PICC line position with tip in right atrium.  IMPRESSION: Cardiomegaly. Mild interstitial prominence bilaterally suspicious for mild interstitial edema. No segmental infiltrate   Electronically Signed   By: Lahoma Crocker M.D.   On: 12/26/2013 16:06   Ct Head Wo Contrast  12/28/2013   CLINICAL DATA:  Acute encephalopathy.  Increased lethargy.  EXAM: CT HEAD WITHOUT CONTRAST  TECHNIQUE: Contiguous axial images were obtained from the base of the skull through the vertex without contrast.  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 Stable low-density in the white matter. Stable encephalomalacia in the right frontal lobe. Stable focal low-density in the left frontal cortex. No evidence for acute hemorrhage, mass lesion, midline shift or new large infarct. Again noted are calcifications in both globes, right side greater the left. Fluid in the ethmoid air cells, left side greater than right. No acute bone abnormality.  IMPRESSION: No acute intracranial abnormality.  Stable hydrocephalus.  Minimal change since 12/18/2013.  Evidence for chronic small  vessel ischemic changes and old insult in the right frontal lobe.   Electronically Signed  By: Markus Daft M.D.   On: 12/28/2013 23:27   Ct Head Wo Contrast  12/18/2013   CLINICAL DATA:  Altered mental status. Nursing home patient, hypoglycemia and unresponsive.  EXAM: CT HEAD WITHOUT CONTRAST  TECHNIQUE: Contiguous axial images were obtained from the base of the skull through the vertex without intravenous contrast.  COMPARISON:  CT of the head February 14, 2012  FINDINGS: Severe hydrocephalus, with ballooning of the temporal horns, unchanged. Anterior recess of the third ventricle is 12 mm, unchanged. Similar periventricular white matter hypodensity. Patchy bilateral thalamus hypodensity is unchanged without mass effect, midline shift. High RIGHT frontal transcortical encephalomalacia. No acute large vascular territory infarct. No hemorrhage.  No abnormal extra-axial fluid collections. Basal cisterns are patent. Severe calcific atherosclerosis of the carotid bulbs.  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 No paranasal sinus air-fluid levels. LEFT middle ear and mastoid effusion, trace RIGHT mastoid effusion.  IMPRESSION: No acute intracranial process.  Stable severe hydrocephalus. Similar periventricular white matter hypodensities may reflect chronic small vessel ischemic disease and/or transependymal flow cerebral spinal fluid. RIGHT frontal encephalomalacia is likely posttraumatic. Nonspecific stable bilateral thalamic hypodensities.  LEFT middle ear and LEFT greater than RIGHT mastoid effusions.   Electronically Signed   By: Elon Alas   On: 12/18/2013 02:22   Ct Abdomen Pelvis W Contrast  12/26/2013   CLINICAL DATA:  Increased lethargy.  Hypoxia today.  EXAM: CT ABDOMEN AND PELVIS WITH CONTRAST  TECHNIQUE: Multidetector CT imaging of the abdomen and pelvis was performed using the standard protocol following bolus administration of intravenous contrast.  CONTRAST:  83mL  OMNIPAQUE IOHEXOL 300 MG/ML SOLN, 120mL OMNIPAQUE IOHEXOL 300 MG/ML SOLN  COMPARISON:  06/18/2012.  FINDINGS: Small bilateral pleural effusions. Mild right lower lobe cylindrical bronchiectasis. Mild bibasilar atelectasis and possible linear scarring. Dense atheromatous coronary artery calcifications.  Unremarkable liver, spleen, pancreas and adrenal glands. Lumbar and thoracic spine degenerative changes and scoliosis.  Cholecystectomy clips. Bilateral renal vascular calcifications. There is also a 5 mm calculus in the lower pole of the right kidney. No bladder or ureteral calculi and no hydronephrosis. A Foley catheter is present in the urinary bladder.  Uterine fibroids. No gastrointestinal abnormalities or enlarged lymph nodes. Normal appearing appendix. Normal appearing left ovary. The right ovary is not clearly visualized.  IMPRESSION: 1. No acute abnormality. 2. Bilateral vascular renal calcifications and at least 1 nonobstructing right renal calculus. 3. Small bilateral pleural effusions. 4. Mild right lower lobe cylindrical bronchiectasis. 5. Mild bibasilar atelectasis. 6. Dense coronary artery atheromatous calcifications.   Electronically Signed   By: Enrique Sack M.D.   On: 12/26/2013 20:13   US Renal  12/29/2013   CLINICAL DATA:  73 year old with acute on chronic renal failure  EXAM: RENAL/URINARY TRACT ULTRASOUND COMPLETE  COMPARISON:  09/24/2012  FINDINGS: Right Kidney:  Length: 10.7 cm. Echogenicity within normal limits. No mass or hydronephrosis visualized. No renal stones are identified.  Left Kidney:  Length: 11.6 cm. Echogenicity within normal limits. No mass or hydronephrosis visualized. No renal stones are identified.  Bladder:  The urinary bladder is decompressed.  IMPRESSION: No hydronephrosis or evidence of nephrolithiasis.   Electronically Signed   By: Rosemarie Ax   On: 12/29/2013 14:23   Mr Foot Right Wo Contrast  12/20/2013   CLINICAL DATA:  Osteomyelitis with wound drainage.  Prior foot amputation 09/07/2013 at the proximal metatarsal level.  EXAM: MRI OF THE RIGHT FOREFOOT WITHOUT CONTRAST  TECHNIQUE: Multiplanar, multisequence MR imaging was performed. No intravenous  contrast was administered.  COMPARISON:  12/18/2013 radiographs  FINDINGS: Despite efforts by the technologist and patient, motion artifact is present on today's exam and could not be eliminated. This reduces exam sensitivity and specificity.  No definite osseous edema to suggest osteomyelitis. Abnormal thin soft tissues along the distal stump margin laterally particulate just anterior to the remaining fourth metatarsal dorsally as on image 7 of series 9 and image 16 of series 3. There may well be some soft tissue breakdown in this vicinity.  No drainable abscess observed. No current compelling findings of gas tracking in the soft tissues of the foot, although gas within the distal soft tissues was a concern on prior conventional radiographs. Some of this appearance was probably due to bandaging along the wound site.  IMPRESSION: 1. No findings of osteomyelitis or abscess, but the soft tissues laterally along the stump appear very thin overlying the distal fourth and fifth metatarsal amputation margins dorsally, possibly from ulceration or soft tissue breakdown.   Electronically Signed   By: Sherryl Barters M.D.   On: 12/20/2013 19:25   Ir Fluoro Guide Cv Line Right  12/22/2013   CLINICAL DATA:  Bacterial infection.  Blind.  EXAM: RIGHT UPPER EXTREMITY PICC LINE PLACEMENT WITH ULTRASOUND AND FLUOROSCOPIC GUIDANCE  FLUOROSCOPY TIME:  36 seconds.  PROCEDURE: The patient was advised of the possible risks and complications and agreed to undergo the procedure. The patient was then brought to the angiographic suite for the procedure.  The right arm was prepped with chlorhexidine, draped in the usual sterile fashion using maximum barrier technique (cap and mask, sterile gown, sterile gloves, large sterile sheet, hand hygiene  and cutaneous antisepsis) and infiltrated locally with 1% Lidocaine.  Ultrasound demonstrated patency of the right basilic vein, and this was documented with an image. Under real-time ultrasound guidance, this vein was accessed with a 21 gauge micropuncture needle and image documentation was performed. A 0.018 wire was introduced in to the vein. Over this, a 5 [place] single lumen Power PICC was advanced to the lower SVC/right atrial junction. Fluoroscopy during the procedure and fluoro spot radiograph confirms appropriate catheter position. The catheter was flushed and covered with a sterile dressing.  COMPLICATIONS: None  LENGTH: 45 cm  IMPRESSION: Successful right arm Power PICC line placement with ultrasound and fluoroscopic guidance. The catheter is ready for use.   Electronically Signed   By: Maryclare Bean M.D.   On: 12/22/2013 14:13   Ir US Guide Vasc Access Right  12/22/2013   CLINICAL DATA:  Bacterial infection.  Blind.  EXAM: RIGHT UPPER EXTREMITY PICC LINE PLACEMENT WITH ULTRASOUND AND FLUOROSCOPIC GUIDANCE  FLUOROSCOPY TIME:  36 seconds.  PROCEDURE: The patient was advised of the possible risks and complications and agreed to undergo the procedure. The patient was then brought to the angiographic suite for the procedure.  The right arm was prepped with chlorhexidine, draped in the usual sterile fashion using maximum barrier technique (cap and mask, sterile gown, sterile gloves, large sterile sheet, hand hygiene and cutaneous antisepsis) and infiltrated locally with 1% Lidocaine.  Ultrasound demonstrated patency of the right basilic vein, and this was documented with an image. Under real-time ultrasound guidance, this vein was accessed with a 21 gauge micropuncture needle and image documentation was performed. A 0.018 wire was introduced in to the vein. Over this, a 5 [place] single lumen Power PICC was advanced to the lower SVC/right atrial junction. Fluoroscopy during the procedure and fluoro spot  radiograph confirms appropriate catheter position.  The catheter was flushed and covered with a sterile dressing.  COMPLICATIONS: None  LENGTH: 45 cm  IMPRESSION: Successful right arm Power PICC line placement with ultrasound and fluoroscopic guidance. The catheter is ready for use.   Electronically Signed   By: Maryclare Bean M.D.   On: 12/22/2013 14:13   Dg Chest Port 1 View  01/01/2014   CLINICAL DATA:  Pulmonary edema.  EXAM: PORTABLE CHEST - 1 VIEW  COMPARISON:  12/29/2013  FINDINGS: Stable positioning of right-sided PICC line with the tip at the cavoatrial junction. Lung volumes are low bilaterally with evidence of persistent mild interstitial edema. Bibasilar atelectasis present. The heart size is stable and mildly enlarged.  IMPRESSION: Low lung volumes with increase in bibasilar atelectasis. Mild interstitial edema and stable cardiomegaly.   Electronically Signed   By: Aletta Edouard M.D.   On: 01/01/2014 14:49   Dg Foot Complete Right  12/18/2013   CLINICAL DATA:  Wound drainage, RIGHT foot amputation 09/07/2013, history diabetes, hypertension, chronic renal failure  EXAM: RIGHT FOOT COMPLETE - 3+ VIEW  COMPARISON:  RIGHT foot radiographs and MR RIGHT foot 02/17/2012  FINDINGS: Interval transmetatarsal amputation.  Soft tissue swelling at distal foot stump.  Soft tissue gas identified at the lateral aspect of the distal foot, distal to the fifth metatarsal.  No acute fracture or dislocation.  No definite bone destruction.  Scattered small vessel vascular calcification.  IMPRESSION: Interval transmetatarsal amputation.  Soft tissue gas at lateral aspect of stump distal to the fifth metatarsal bone question related to ulcer or wound breakdown.  No definite bone destruction is seen to suggest osteomyelitis.  However if osteomyelitis remains a clinical concern, recommend MR imaging.   Electronically Signed   By: Lavonia Dana M.D.   On: 12/18/2013 17:07     Microbiology: No results found for this or any  previous visit (from the past 240 hour(s)).   Labs: Basic Metabolic Panel:  Recent Labs Lab 01/03/14 0847 01/04/14 0403 01/05/14 0450 01/06/14 0455 01/07/14 0509  NA 144 142 140 137 139  K 4.3 4.3 4.2 4.0 4.1  CL 101 98 96 95* 98  CO2 31 32 32 31 30  GLUCOSE 98 129* 79 100* 98  BUN 31* 30* 28* 25* 25*  CREATININE 2.79* 2.61* 2.46* 2.17* 2.06*  CALCIUM 8.7 8.5 8.8 8.9 8.8   Liver Function Tests: No results for input(s): AST, ALT, ALKPHOS, BILITOT, PROT, ALBUMIN in the last 168 hours. No results for input(s): LIPASE, AMYLASE in the last 168 hours. No results for input(s): AMMONIA in the last 168 hours. CBC:  Recent Labs Lab 01/06/14 0455  WBC 7.4  HGB 10.4*  HCT 32.9*  MCV 90.6  PLT 291   Cardiac Enzymes: No results for input(s): CKTOTAL, CKMB, CKMBINDEX, TROPONINI in the last 168 hours. BNP: Invalid input(s): POCBNP CBG:  Recent Labs Lab 01/06/14 2033 01/06/14 2344 01/07/14 0002 01/07/14 0402 01/07/14 0733  GLUCAP 105* 67* 83 98 87    Time coordinating discharge:  Greater than 30 minutes  Signed:  Stepen Prins, DO [facility] Pager: [phone] 01/07/2014, 8:53 AM

## 2014-01-07 NOTE — Progress Notes (Signed)
Patient is set to discharge back to [facility] today. CSW left message for patient's guardian, [name] (ph#: [phone]). Discharge packet given to RN, Magda Paganini. [facility] will be called for transport when ready.     Raynaldo Opitz, [facility] Clinical Social Worker cell #: [phone]

## 2014-01-07 NOTE — Progress Notes (Signed)
Hypoglycemic Event  CBG: 67 at 2344  Treatment: 15 GM carbohydrate snack  Symptoms: None  Follow-up CBG: Time: 0005 CBG Result:83  Possible Reasons for Event: Inadequate meal intake  Comments/MD notified:Patient given an additional 15 grams of carbs after recheck as well per protocol. Continues to be asymptomatic.    Alesia Richards, 01/07/2014, .at 0005   Remember to initiate Hypoglycemia Order Set & complete

## 2014-01-07 NOTE — Progress Notes (Signed)
Report called to SNF, spoke with Jacqlyn Larsen, RN familiar with patient.  They are concerned that her mental status is not improved further.  Spoke with Dr. Carles Collet, pt is medically stable and cleared to go - further workup could potentially be done as an outpatient, as no acute cause has been found to be causing mental status changes.  See Dr. Doristine Devoid discharge summary.  Facility will accept pt's return.  PICC line d/c'd by IV team RN, Foley to remain in place as it is chronic.  Pt transported to SNF via EMS in stable condition.  Coolidge Breeze, RN 01/07/2014

## 2014-01-07 NOTE — Progress Notes (Signed)
Per MD order, PICC line removed. Cath intact at 45cm. Vaseline pressure gauze to site, pressure held x 34min. No bleeding to site. EMS is here to transport pt. [name]

## 2014-01-08 ENCOUNTER — Other Ambulatory Visit: Payer: Self-pay

## 2014-01-08 ENCOUNTER — Non-Acute Institutional Stay (SKILLED_NURSING_FACILITY): Payer: Medicare Other | Admitting: Adult Health

## 2014-01-08 ENCOUNTER — Encounter: Payer: Self-pay | Admitting: Adult Health

## 2014-01-08 DIAGNOSIS — E785 Hyperlipidemia, unspecified: Secondary | ICD-10-CM

## 2014-01-08 DIAGNOSIS — Z89431 Acquired absence of right foot: Secondary | ICD-10-CM

## 2014-01-08 DIAGNOSIS — I5043 Acute on chronic combined systolic (congestive) and diastolic (congestive) heart failure: Secondary | ICD-10-CM

## 2014-01-08 DIAGNOSIS — E1159 Type 2 diabetes mellitus with other circulatory complications: Secondary | ICD-10-CM

## 2014-01-08 DIAGNOSIS — F4321 Adjustment disorder with depressed mood: Secondary | ICD-10-CM

## 2014-01-08 DIAGNOSIS — R339 Retention of urine, unspecified: Secondary | ICD-10-CM

## 2014-01-08 DIAGNOSIS — F329 Major depressive disorder, single episode, unspecified: Secondary | ICD-10-CM

## 2014-01-08 DIAGNOSIS — E1151 Type 2 diabetes mellitus with diabetic peripheral angiopathy without gangrene: Secondary | ICD-10-CM

## 2014-01-08 DIAGNOSIS — K219 Gastro-esophageal reflux disease without esophagitis: Secondary | ICD-10-CM

## 2014-01-08 DIAGNOSIS — G459 Transient cerebral ischemic attack, unspecified: Secondary | ICD-10-CM

## 2014-01-08 MED ORDER — HYDROCODONE-ACETAMINOPHEN 5-325 MG PO TABS: ORAL_TABLET | ORAL | Status: DC

## 2014-01-08 NOTE — Telephone Encounter (Signed)
[facility]  

## 2014-01-08 NOTE — Progress Notes (Signed)
Patient ID: [name], female   DOB: [birth year], 73 y.o.   MRN: [id number]  [facility]     No Known Allergies     Chief Complaint  Patient presents with  . Hospitalization Follow-up    HPI:  She is a long term resident of this facility who has been hospitalized for acute on chronic diastolic heart failure; acute on chronic stage III ckd. She has been treated for an uti for which she has completed her abt on 01-01-14. She had acute encephalopathy, she is presently back at her baseline with intermittent confusion present. Her lasix has been placed on hold until her creatinine returns to her baseline of 0.7 - 1.0. She is unable to full participate in the hpi or ros.    Past Medical History  Diagnosis Date  . Hypertension   . Diabetes mellitus   . Blindness   . TIA (transient ischemic attack)   . Anemia   . GERD (gastroesophageal reflux disease)   . Constipation   . Renal disorder 05/2012    acute Renal Failure    Past Surgical History  Procedure Laterality Date  . Gallbladder surgery    . Esophageal dilation    . Cholecystectomy    . Amputation  02/18/2012    Procedure: AMPUTATION RAY;  Surgeon: [name], MD;  Location: [facility];  Service: Orthopedics;  Laterality: Right;  right second toe  . Tee without cardioversion  02/21/2012    Procedure: TRANSESOPHAGEAL ECHOCARDIOGRAM (TEE);  Surgeon: [name], MD;  Location: [facility] ENDOSCOPY;  Service: Cardiovascular;  Laterality: N/A;   spoke with [name] from [place] pt will arrive by 815ish( thy change shifts at [place])  . Tee without cardioversion  02/25/2012    Procedure: TRANSESOPHAGEAL ECHOCARDIOGRAM (TEE);  Surgeon: [name], MD;  Location: [place];  Service: Cardiovascular;  Laterality: N/A;  . Amputation Right 09/07/2013    Procedure: AMPUTATION RAY;  Surgeon: [name], MD;  Location: [place];  Service: Orthopedics;  Laterality: Right;  Right Transmetatarsal Amputation    VITAL SIGNS BP 122/63 mmHg  Pulse 60   Ht 5\' 2"  (1.575 m)  Wt 191 lb (86.637 kg)  BMI 34.93 kg/m2   Outpatient Encounter Prescriptions as of 01/08/2014  Medication Sig  . acetaminophen (TYLENOL) 325 MG tablet Take 650 mg by mouth every 6 (six) hours as needed for fever.   Marland Kitchen aspirin EC 325 MG tablet Take 325 mg by mouth daily.    . Brinzolamide-Brimonidine (SIMBRINZA) 1-0.2 % SUSP Place 1 drop into the left eye 2 (two) times daily.  . carvedilol (COREG) 3.125 MG tablet Take 3.125 mg by mouth 2 (two) times daily with a meal.  . collagenase (SANTYL) ointment Apply topically daily.  Marland Kitchen docusate sodium (COLACE) 100 MG capsule Take 100 mg by mouth 2 (two) times daily.  . DULoxetine (CYMBALTA) 60 MG capsule Take 1 capsule (60 mg total) by mouth daily. When completes 2 wks at 30mg  (Patient taking differently: Take 30 mg by mouth daily. On 01-23-14: being 60 mg daily)  . feeding supplement, ENSURE, (ENSURE) PUDG Take 1 Container by mouth 2 (two) times daily between meals.  . gabapentin (NEURONTIN) 100 MG capsule Take 3 capsules (300 mg total) by mouth at bedtime. Phantom pain  . HYDROcodone-acetaminophen (NORCO/VICODIN) 5-325 MG per tablet Take one tablet by mouth every 6 hours scheduled for pain  . hydrocortisone (ANUSOL-HC) 2.5 % rectal cream Place 1 application rectally 2 (two) times daily as needed for hemorrhoids.   Marland Kitchen  insulin aspart (NOVOLOG) 100 UNIT/ML injection Inject 5 Units into the skin 3 (three) times daily with meals. 5 units prior to meals for cbg >=150)  . ipratropium-albuterol (DUONEB) 0.5-2.5 (3) MG/3ML SOLN Take 3 mLs by nebulization every 6 (six) hours as needed (wheezing).  . metoCLOPramide (REGLAN) 5 MG tablet Take 1 tablet (5 mg total) by mouth 4 (four) times daily -  before meals and at bedtime.  . Multiple Vitamins-Minerals (MULTIVITAMIN WITH MINERALS) tablet Take 1 tablet by mouth daily.  . polyethylene glycol (MIRALAX / GLYCOLAX) packet Take 17 g by mouth daily.  . promethazine (PHENERGAN) 25 MG tablet Take 25 mg  by mouth every 6 (six) hours as needed for nausea or vomiting.  . protein supplement (RESOURCE BENEPROTEIN) POWD Take 1 scoop by mouth 3 (three) times daily with meals.  . simvastatin (ZOCOR) 20 MG tablet Take 20 mg by mouth daily.  . sodium chloride (OCEAN) 0.65 % SOLN nasal spray Place 1 spray into the nose 4 (four) times daily as needed for congestion.  . timolol (TIMOPTIC) 0.5 % ophthalmic solution Place 1 drop into the left eye 2 (two) times daily.     SIGNIFICANT DIAGNOSTIC EXAMS  12-20-13: mri right foot: 1. No findings of osteomyelitis or abscess, but the soft tissues laterally along the stump appear very thin overlying the distal fourth and fifth metatarsal amputation margins dorsally, possibly from ulceration or soft tissue breakdown.  12-26-13: chest x-ray: Cardiomegaly. Mild interstitial prominence bilaterally suspicious for mild interstitial edema. No segmental infiltrate  12-26-13: ct of abdomen and pelvis: 1. No acute abnormality. 2. Bilateral vascular renal calcifications and at least 1 nonobstructing right renal calculus.3. Small bilateral pleural effusions.4. Mild right lower lobe cylindrical bronchiectasis.5. Mild bibasilar atelectasis. 6. Dense coronary artery atheromatous calcifications.  12-27-13: 2-d echo: Mild LVH with LVEF 50-55%, inferolateral hypokinesis, grade 1 diastolic dysfunction with increased filling pressures. Mild left atrial enlargement. MAC with moderate mitral regurgitation. Mild tricuspid regurgitation with PASP 49 mmHg.  12-28-13: ct of head: No acute intracranial abnormality. Stable hydrocephalus.  Minimal change since 12/18/2013.  12-29-13: chest x-ray: Bronchitic changes with posterior density at the lung bases on lateral view question pleural effusions, cannot exclude RIGHT lower lobe consolidation.  12-29-13: renal ultrasound: No hydronephrosis or evidence of nephrolithiasis.  01-01-14: chest x-ray: Low lung volumes with increase in bibasilar  atelectasis. Mild interstitial edema and stable cardiomegaly.      LABS REVIEWED:   12-18-13: hgb a1c 6.2; tsh 0.858 12-27-13: wbc 9.5; hgb 10.1; hct 32.0; mcv 90.1; plt 275; glucose 169; bun 22; creat 0.97; k+3.9; na++142; liver normal albumin 2.3; BNP 4645 12-28-13: ammonia 26 12-30-13: glucose 120; bun 24; creat 2.1; k+ 4.0; na++142 01-05-14: glucose 79; bun 28; creat 2.46; k+4.2; na++ 140     Review of Systems  Unable to perform ROS    Physical Exam  Constitutional: No distress.  Obese   Neck: Neck supple. No JVD present. No thyromegaly present.  Cardiovascular: Normal rate and regular rhythm.   Pedal pulses present status post right metatarsal amputation   Respiratory: Effort normal and breath sounds normal. No respiratory distress.  GI: Soft. Bowel sounds are normal. She exhibits no distension.  Genitourinary:  Has foley   Musculoskeletal: She exhibits no edema.  Is able to move all extremities   Neurological: She is alert.  Skin: Skin is warm and dry. She is not diaphoretic.  Incision form right right metatarsal amputation no signs of infection present has small open area on lateral  side with granulating tissue present.        ASSESSMENT/ PLAN:   1. Diabetes: will continue novolog 5 units prior to meals for cbg >=150; and will monitor status her hgb a1c is 6.2   2.systolic and diastolic heart failure: at this time her lasix 20 mg daily is on hold until her creat is 0.7-1.0. Will coreg 3.125 mg twice daily and will monitor   3. peripheral neuropathy: will continue neurontin 300 mg nightly; will continue vicodin 5/325 mg every 6 hours as needed for pain; will continue cymbalta 60 mg daily for pain as well as depression and will monitor her status.   4.dyslipidemia: will continue zocor 20 mg daily   5. GERD: is stable will continue reglan 5 mg four times daily will monitor   6. Constipation: will continue colace twice daily; and miralax daily   7. TIA: is  neurologically stable; will continue asa 325 mg daily   8. Glaucoma: will continue simbrinza to left eye twice daily and timoptic to left eye twice daily   9. Depression: she is without change will continue cymbalta 60 mg daily will monitor   10. Urine retention: has foley long term will monitor has completed treatment for recent uti.  11. Right metatarsal amputation wound: will continue wound care as directed and will monitor   Time spent with patient 50 minutes.      Ok Edwards NP [facility]  Contact [phone] Monday through Friday 8am- 5pm  After hours call [phone]

## 2014-01-08 NOTE — Telephone Encounter (Signed)
RX faxed to [facility] @ [phone], phone number [phone] 

## 2014-01-11 LAB — BASIC METABOLIC PANEL
BUN: 22 mg/dL — AB (ref 4–21)
Creatinine: 1.5 mg/dL — AB (ref 0.5–1.1)
GLUCOSE: 147 mg/dL
Potassium: 4.8 mmol/L (ref 3.4–5.3)
Sodium: 140 mmol/L (ref 137–147)

## 2014-01-12 ENCOUNTER — Other Ambulatory Visit: Payer: Self-pay | Admitting: *Deleted

## 2014-01-12 MED ORDER — HYDROCODONE-ACETAMINOPHEN 5-325 MG PO TABS: ORAL_TABLET | ORAL | Status: DC

## 2014-01-12 NOTE — Telephone Encounter (Signed)
[facility] 

## 2014-01-13 ENCOUNTER — Encounter: Payer: Self-pay | Admitting: Adult Health

## 2014-01-13 DIAGNOSIS — R339 Retention of urine, unspecified: Secondary | ICD-10-CM | POA: Insufficient documentation

## 2014-01-19 ENCOUNTER — Encounter: Payer: Self-pay | Admitting: Internal Medicine

## 2014-01-19 ENCOUNTER — Non-Acute Institutional Stay (SKILLED_NURSING_FACILITY): Payer: Medicare Other | Admitting: Internal Medicine

## 2014-01-19 DIAGNOSIS — I739 Peripheral vascular disease, unspecified: Secondary | ICD-10-CM

## 2014-01-19 DIAGNOSIS — N183 Chronic kidney disease, stage 3 unspecified: Secondary | ICD-10-CM

## 2014-01-19 DIAGNOSIS — E785 Hyperlipidemia, unspecified: Secondary | ICD-10-CM

## 2014-01-19 DIAGNOSIS — Z89431 Acquired absence of right foot: Secondary | ICD-10-CM

## 2014-01-19 DIAGNOSIS — G459 Transient cerebral ischemic attack, unspecified: Secondary | ICD-10-CM

## 2014-01-19 DIAGNOSIS — E1151 Type 2 diabetes mellitus with diabetic peripheral angiopathy without gangrene: Secondary | ICD-10-CM

## 2014-01-19 DIAGNOSIS — E1159 Type 2 diabetes mellitus with other circulatory complications: Secondary | ICD-10-CM

## 2014-01-19 DIAGNOSIS — G9340 Encephalopathy, unspecified: Secondary | ICD-10-CM

## 2014-01-19 DIAGNOSIS — I5043 Acute on chronic combined systolic (congestive) and diastolic (congestive) heart failure: Secondary | ICD-10-CM

## 2014-01-19 NOTE — Progress Notes (Signed)
Patient ID: [name], female   DOB: 1940-07-21, 73 y.o.   MRN: [id number]     [facility]  No Known Allergies  CODE STATUS: Full; MOST form in chart  Chief Complaint  Patient presents with  . Readmit To SNF    HPI:  73 yo female seen today for readmission into SNF. She was hospitalized for mild acute on chronic diastolic heart failure; acute on chronic stage III CKD; ESBL Klebsiella UTI (treated with Invanz); acute encephalopathy with neg CT head. She is presently back at her baseline with intermittent confusion present. Her lasix has been placed on hold until her creatinine returns to her baseline of 0.7 - 1.0. She has no concerns today.Legally blind in [place].  Past Medical History  Diagnosis Date  . Hypertension   . Diabetes mellitus   . Blindness   . TIA (transient ischemic attack)   . Anemia   . GERD (gastroesophageal reflux disease)   . Constipation   . Renal disorder 05/2012    acute Renal Failure  . SYNCOPE 05/19/2009    Annotation: multiple over past 1 yr  Qualifier: Diagnosis of  By: [name] MD, [name]    . CHOLECYSTECTOMY, HX OF 05/19/2009    Qualifier: Diagnosis of  By: [name] MD, [name]      Past Surgical History  Procedure Laterality Date  . Gallbladder surgery    . Esophageal dilation    . Cholecystectomy    . Amputation  02/18/2012    Procedure: AMPUTATION RAY;  Surgeon: [name], MD;  Location: [facility];  Service: Orthopedics;  Laterality: Right;  right second toe  . Tee without cardioversion  02/21/2012    Procedure: TRANSESOPHAGEAL ECHOCARDIOGRAM (TEE);  Surgeon: [name], MD;  [facility];  Service: Cardiovascular;  Laterality: N/A;   spoke with [name] from [place] pt will arrive by 815ish( thy change shifts at [place])  . Tee without cardioversion  02/25/2012    Procedure: TRANSESOPHAGEAL ECHOCARDIOGRAM (TEE);  Surgeon: [name], MD;  Location: [place];  Service: Cardiovascular;  Laterality: N/A;  . Amputation Right  09/07/2013    Procedure: AMPUTATION RAY;  Surgeon: [name], MD;  Location: [place];  Service: Orthopedics;  Laterality: Right;  Right Transmetatarsal Amputation    VITAL SIGNS Filed Vitals:   01/19/14 1446  BP: 132/64  Pulse: 70  Temp: 98 F (36.7 C)  Resp: 18  Weight: 189 lb (85.73 kg)  SpO2: 98%     Outpatient Encounter Prescriptions as of 01/08/2014  Medication Sig  . acetaminophen (TYLENOL) 325 MG tablet Take 650 mg by mouth every 6 (six) hours as needed for fever.   Marland Kitchen aspirin EC 325 MG tablet Take 325 mg by mouth daily.    . Brinzolamide-Brimonidine (SIMBRINZA) 1-0.2 % SUSP Place 1 drop into the left eye 2 (two) times daily.  . carvedilol (COREG) 3.125 MG tablet Take 3.125 mg by mouth 2 (two) times daily with a meal.  . collagenase (SANTYL) ointment Apply topically daily.  Marland Kitchen docusate sodium (COLACE) 100 MG capsule Take 100 mg by mouth 2 (two) times daily.  . DULoxetine (CYMBALTA) 60 MG capsule Take 1 capsule (60 mg total) by mouth daily. When completes 2 wks at 30mg  (Patient taking differently: Take 30 mg by mouth daily. On 01-23-14: being 60 mg daily)  . feeding supplement, ENSURE, (ENSURE) PUDG Take 1 Container by mouth 2 (two) times daily between meals.  . gabapentin (NEURONTIN) 100 MG capsule Take 3 capsules (  300 mg total) by mouth at bedtime. Phantom pain  . HYDROcodone-acetaminophen (NORCO/VICODIN) 5-325 MG per tablet Take one tablet by mouth every 6 hours scheduled for pain  . hydrocortisone (ANUSOL-HC) 2.5 % rectal cream Place 1 application rectally 2 (two) times daily as needed for hemorrhoids.   . insulin aspart (NOVOLOG) 100 UNIT/ML injection Inject 5 Units into the skin 3 (three) times daily with meals. 5 units prior to meals for cbg >=150)  . ipratropium-albuterol (DUONEB) 0.5-2.5 (3) MG/3ML SOLN Take 3 mLs by nebulization every 6 (six) hours as needed (wheezing).  . metoCLOPramide (REGLAN) 5 MG tablet Take 1 tablet (5 mg total) by mouth 4 (four) times daily -   before meals and at bedtime.  . Multiple Vitamins-Minerals (MULTIVITAMIN WITH MINERALS) tablet Take 1 tablet by mouth daily.  . polyethylene glycol (MIRALAX / GLYCOLAX) packet Take 17 g by mouth daily.  . promethazine (PHENERGAN) 25 MG tablet Take 25 mg by mouth every 6 (six) hours as needed for nausea or vomiting.  . protein supplement (RESOURCE BENEPROTEIN) POWD Take 1 scoop by mouth 3 (three) times daily with meals.  . simvastatin (ZOCOR) 20 MG tablet Take 20 mg by mouth daily.  . sodium chloride (OCEAN) 0.65 % SOLN nasal spray Place 1 spray into the nose 4 (four) times daily as needed for congestion.  . timolol (TIMOPTIC) 0.5 % ophthalmic solution Place 1 drop into the left eye 2 (two) times daily.     SIGNIFICANT DIAGNOSTIC EXAMS  12-20-13: mri right foot: 1. No findings of osteomyelitis or abscess, but the soft tissues laterally along the stump appear very thin overlying the distal fourth and fifth metatarsal amputation margins dorsally, possibly from ulceration or soft tissue breakdown.  12-26-13: chest x-ray: Cardiomegaly. Mild interstitial prominence bilaterally suspicious for mild interstitial edema. No segmental infiltrate  12-26-13: ct of abdomen and pelvis: 1. No acute abnormality. 2. Bilateral vascular renal calcifications and at least 1 nonobstructing right renal calculus.3. Small bilateral pleural effusions.4. Mild right lower lobe cylindrical bronchiectasis.5. Mild bibasilar atelectasis. 6. Dense coronary artery atheromatous calcifications.  12-27-13: 2-d echo: Mild LVH with LVEF 50-55%, inferolateral hypokinesis, grade 1 diastolic dysfunction with increased filling pressures. Mild left atrial enlargement. MAC with moderate mitral regurgitation. Mild tricuspid regurgitation with PASP 49 mmHg.  12-28-13: ct of head: No acute intracranial abnormality. Stable hydrocephalus.  Minimal change since 12/18/2013.  12-29-13: chest x-ray: Bronchitic changes with posterior density at the  lung bases on lateral view question pleural effusions, cannot exclude RIGHT lower lobe consolidation.  12-29-13: renal ultrasound: No hydronephrosis or evidence of nephrolithiasis.  01-01-14: chest x-ray: Low lung volumes with increase in bibasilar atelectasis. Mild interstitial edema and stable cardiomegaly.      LABS REVIEWED:   12-18-13: hgb a1c 6.2; tsh 0.858 12-27-13: wbc 9.5; hgb 10.1; hct 32.0; mcv 90.1; plt 275; glucose 169; bun 22; creat 0.97; k+3.9; na++142; liver normal albumin 2.3; BNP 4645 12-28-13: ammonia 26 12-30-13: glucose 120; bun 24; creat 2.1; k+ 4.0; na++142 01-05-14: glucose 79; bun 28; creat 2.46; k+4.2; na++ 140     Review of Systems  Constitutional: Negative for fever, chills and malaise/fatigue.  Respiratory: Negative for cough and shortness of breath.   Cardiovascular: Negative for chest pain, palpitations, orthopnea and leg swelling.  Gastrointestinal: Negative for nausea, vomiting, abdominal pain and constipation.  Genitourinary: Negative for dysuria and flank pain.  Neurological: Negative for dizziness, seizures and weakness.  Psychiatric/Behavioral: Positive for memory loss.     Physical Exam  Vitals reviewed.  Constitutional: No distress.  Obese   HENT:  Mouth/Throat: Oropharynx is clear and moist. No oropharyngeal exudate.  Neck: Neck supple. No JVD present.  Cardiovascular: Normal rate, regular rhythm, normal heart sounds and intact distal pulses.  Exam reveals no gallop and no friction rub.   No murmur heard. Pedal pulses present status post right metatarsal amputation   Respiratory: Effort normal and breath sounds normal. No respiratory distress. She has no wheezes. She has no rales.  GI: Soft. Bowel sounds are normal. She exhibits no distension. There is no tenderness. There is no rebound and no guarding.  Genitourinary:  Has foley DTG clear yellow urine   Musculoskeletal: She exhibits no edema.  Is able to move all extremities; s/p right  TMA  Lymphadenopathy:    She has no cervical adenopathy.  Neurological: She is alert.  Skin: Skin is warm and dry. She is not diaphoretic.  Incision form right right metatarsal amputation no signs of infection present has small open area on lateral side with granulating tissue present.    CMP Latest Ref Rng 01/11/2014 01/07/2014 01/06/2014  Glucose 70 - 99 mg/dL -147 98 100(H)  BUN 4 - 21 mg/dL 22(A) 25(H) 25(H)  Creatinine 0.5 - 1.1 mg/dL 1.5(A) 2.06(H) 2.17(H)  Sodium 137 - 147 mmol/L 140 139 137  Potassium 3.4 - 5.3 mmol/L 4.8 4.1 4.0  Chloride 96 - 112 mEq/L - 98 95(L)  CO2 19 - 32 mEq/L - 30 31  Calcium 8.4 - 10.5 mg/dL - 8.8 8.9  Total Protein 6.0 - 8.3 g/dL - - -  Total Bilirubin 0.3 - 1.2 mg/dL - - -  Alkaline Phos 39 - 117 U/L - - -  AST 0 - 37 U/L - - -  ALT 0 - 35 U/L - - -     ASSESSMENT/ PLAN:  Acute on chronic combined systolic and diastolic heart failure - resolved; appears euvolemic at this time. Continue current tx. Check BMP to assess if can resume diuretic  Type 2 diabetes, controlled, with peripheral circulatory disorder - stable; continue SSI  Chronic renal failure, stage 3 (moderate) - check BMP and if Cr <1, will restart lasix  Hyperlipidemia with target LDL less than 100 - on statin  Acute encephalopathy - mental status is stable  Transient cerebral ischemia, unspecified transient cerebral ischemia type -  Stable on ASA  PAD (peripheral artery disease) - stable  Status post transmetatarsal amputation of right foot - continue wound care; pain control with prn norco   Chirsty Armistead S. Perlie Gold  [facility] [address] [place], [place] [address] [phone] Office (Wednesdays and Fridays 8 AM - 5 PM) [phone] Cell (Monday-Friday 8 AM - 5 PM)

## 2014-01-23 IMAGING — CT CT ABD-PELV W/ CM
1 of 4 series · 13 of 32 positions shown, 18 images · IV contrast (omnipaque)
Comparison: 06/26/2011

CLINICAL DATA: Vaginal bleeding.

CT ABDOMEN AND PELVIS WITH CONTRAST
TECHNIQUE: Multidetector CT imaging of the abdomen and pelvis was
performed following the standard protocol during bolus
administration of intravenous contrast.
Contrast: 100mL OMNIPAQUE IOHEXOL 300 MG/ML  SOLN

[Series 2: abd/pel with · axial · 0.72mm/px · z∈[+1122,+1482]mm · 13 of 84 slices shown, 18 images]
[im 6/84  soft-tissue]
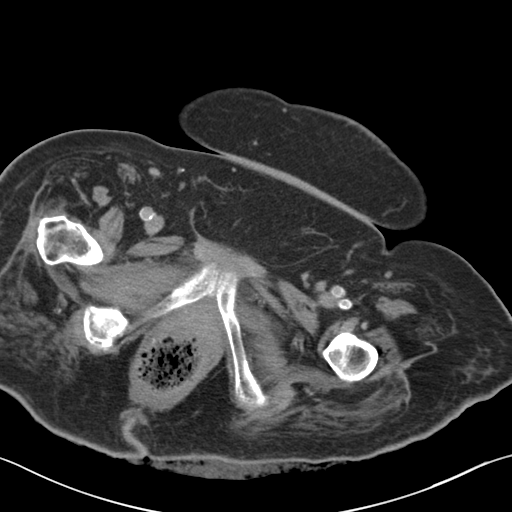
[im 6/84  bone]
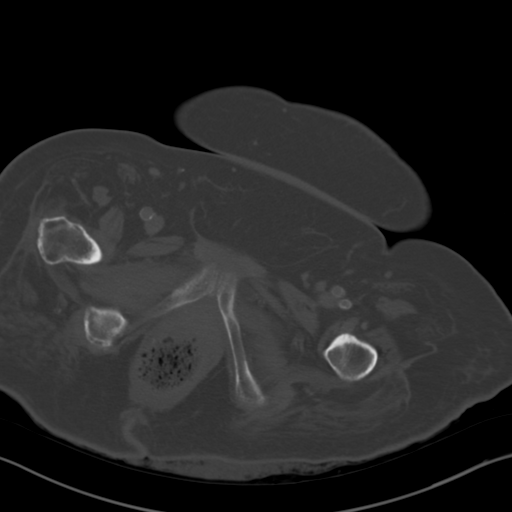
[im 11/84  soft-tissue]
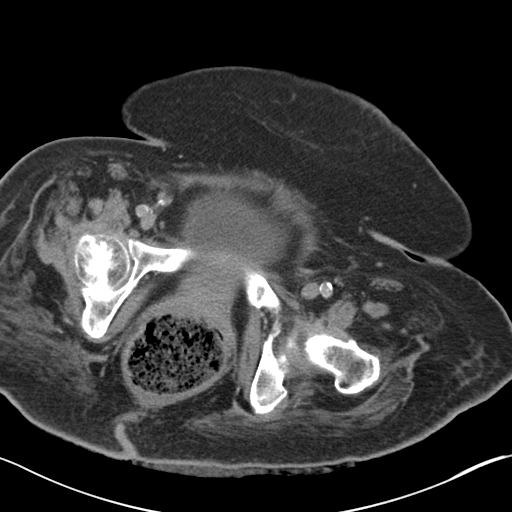
[im 21/84  soft-tissue]
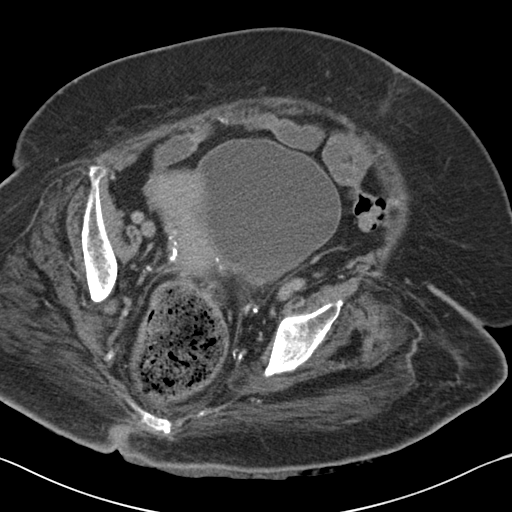
[im 26/84  soft-tissue]
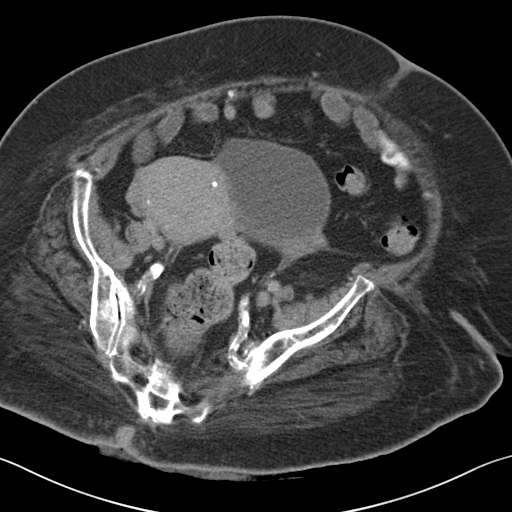
[im 32/84  soft-tissue]
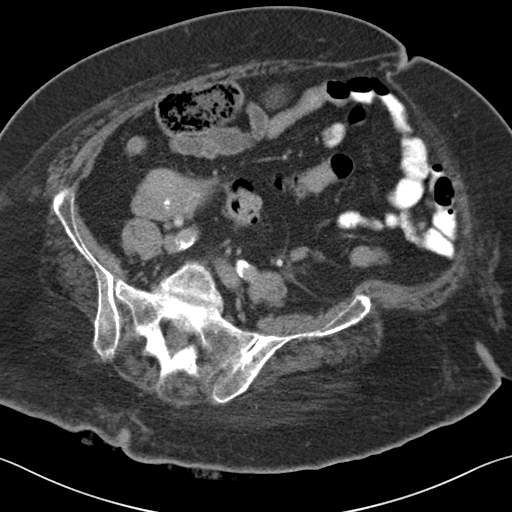
[im 37/84  soft-tissue]
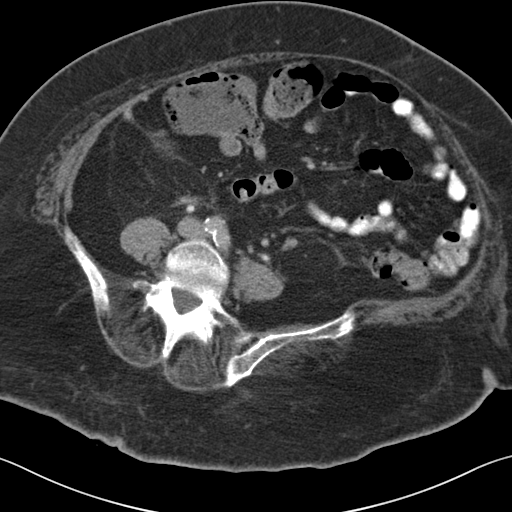
[im 47/84  soft-tissue]
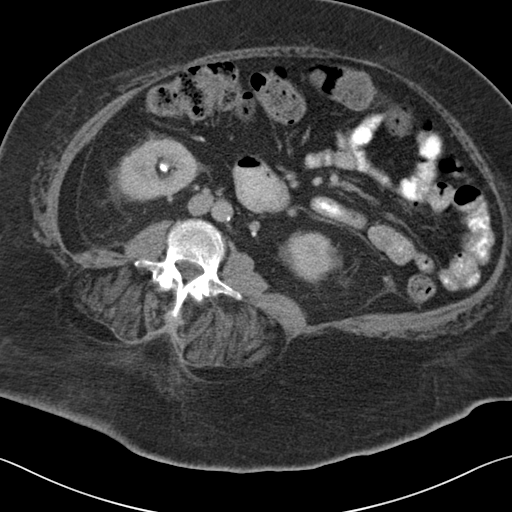
[im 52/84  soft-tissue]
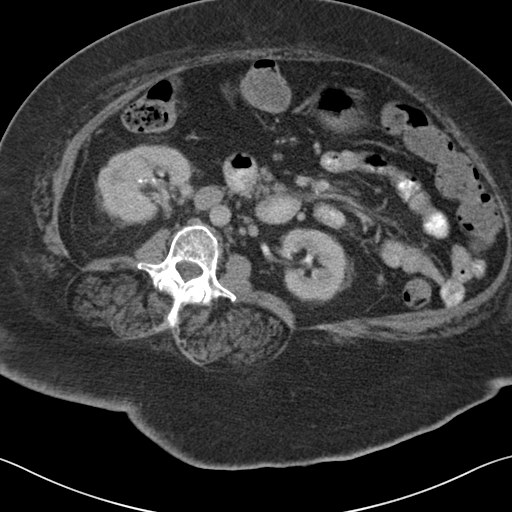
[im 58/84  soft-tissue]
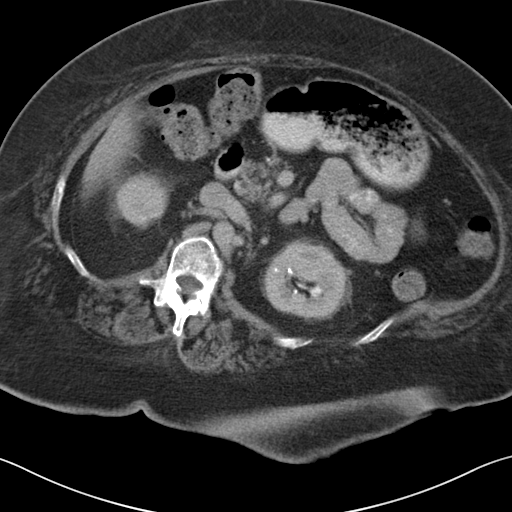
[im 58/84  bone]
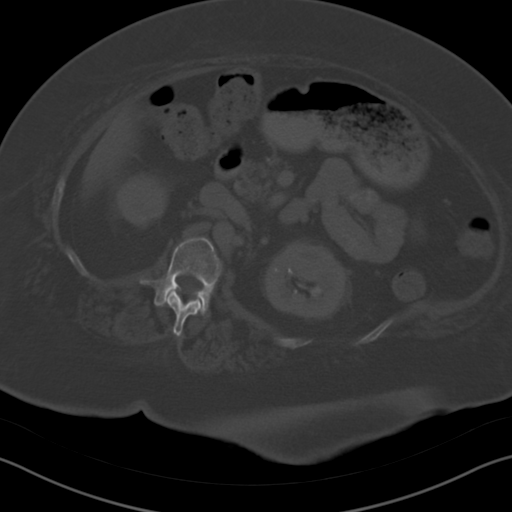
[im 63/84  soft-tissue]
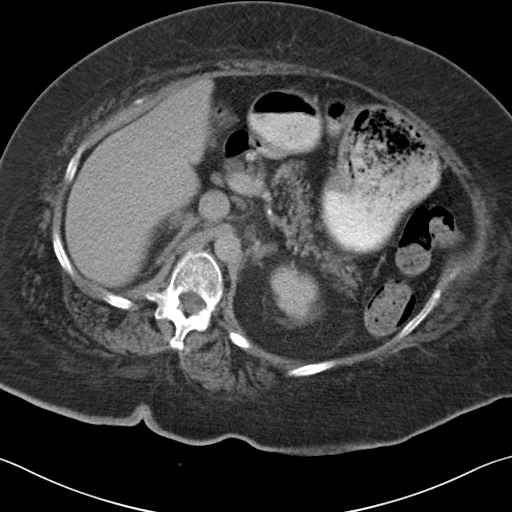
[im 63/84  lung]
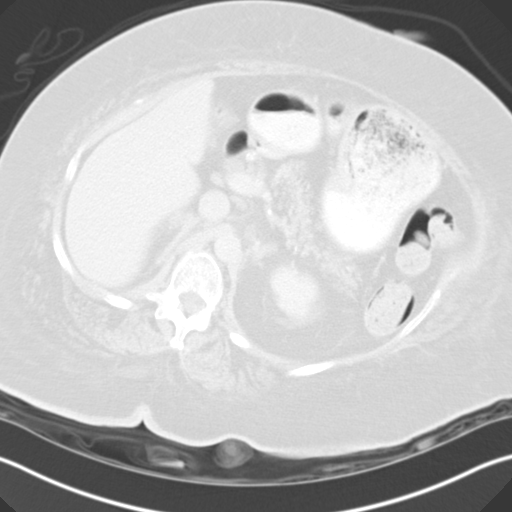
[im 68/84  lung]
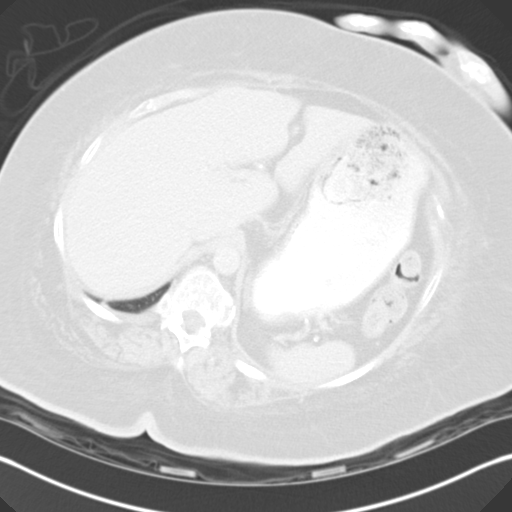
[im 73/84  soft-tissue]
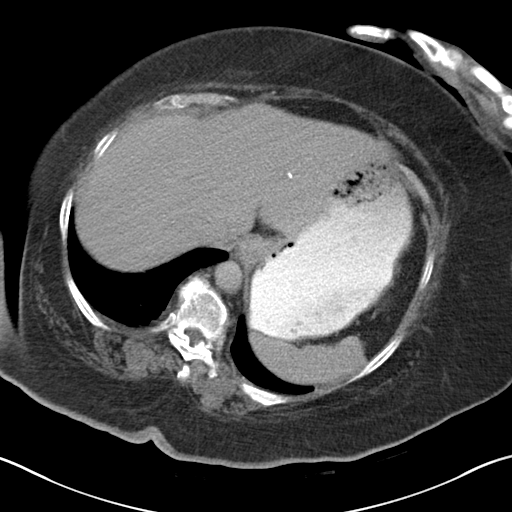
[im 73/84  lung]
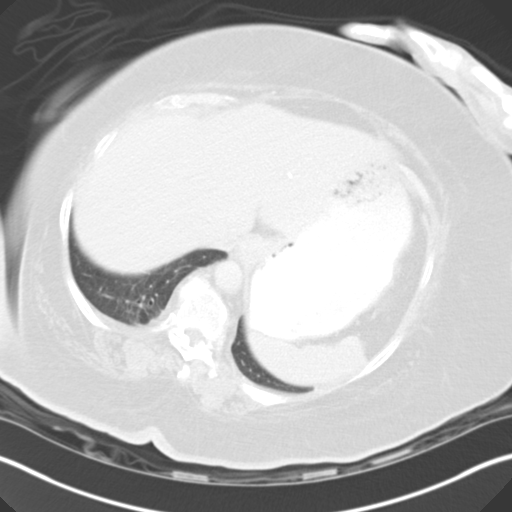
[im 78/84  soft-tissue]
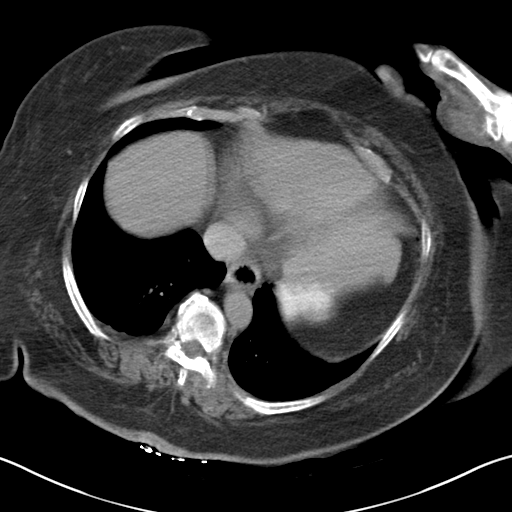
[im 78/84  lung]
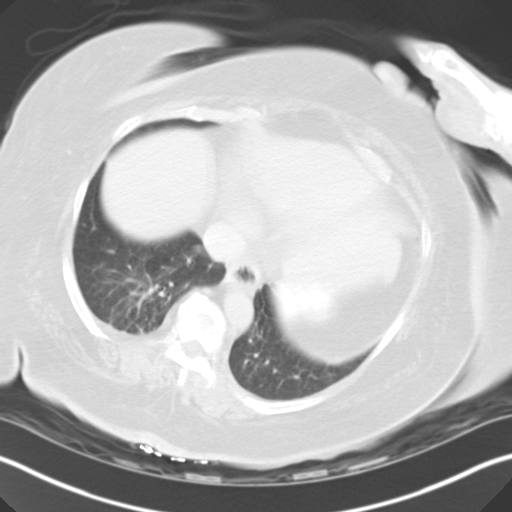

[13 of 32 positions shown; findings below may reference images not displayed]

FINDINGS: The lung bases are clear except for right basilar
scarring changes.  The heart is mildly enlarged.  Coronary artery
calcifications are noted.  There is a small hiatal hernia noted.

The liver is unremarkable.  No focal hepatic lesions or
intrahepatic biliary dilatation.  A calcified granuloma is noted in
the left lobe.  The gallbladder is surgically absent.  No common
bile duct dilatation.  The pancreas is unremarkable.  The spleen is
normal in size.  No focal lesions.  The adrenal glands and kidneys
appear stable.  There are bilateral renal calculi and renal
scarring changes but no hydronephrosis or obstructing ureteral
calculi.  No bladder calculi.

The stomach, duodenum, small bowel and colon are unremarkable.  The
appendix is normal.  No mesenteric or retroperitoneal mass or
adenopathy.  The aorta is normal in caliber.  Moderate
atherosclerotic calcifications involving the aorta, branch vessels
and iliac arteries.

The bladder is mildly distended.  The uterus appears stable.
Suspect fundal fibroids but no obvious endometrial or cervical
mass.  There is moderate fecal impaction in the left with mild
rectal wall thickening.  No pelvic mass, adenopathy or free pelvic
fluid collections.  No inguinal mass or adenopathy.

The bony structures are intact.  There are degenerative changes
involving the hips, SI joints and spine.
IMPRESSION: 1.  No acute abdominal/pelvic findings, mass lesions or adenopathy.
2.  Bilateral renal calculi and renal scarring changes.
3.  Slightly enlarged uterus with suspected fibroids unchanged
since prior study.  No endometrial or adnexal mass is identified.
4.  Mild fecal impaction in the rectum.

## 2014-01-25 MED ORDER — ATORVASTATIN CALCIUM 10 MG PO TABS: 10.0000 mg | ORAL_TABLET | Freq: Every day | ORAL | Status: DC

## 2014-01-26 ENCOUNTER — Other Ambulatory Visit: Payer: Self-pay | Admitting: *Deleted

## 2014-01-26 MED ORDER — HYDROCODONE-ACETAMINOPHEN 5-325 MG PO TABS: ORAL_TABLET | ORAL | Status: DC

## 2014-01-26 NOTE — Telephone Encounter (Signed)
[facility] 

## 2014-02-02 ENCOUNTER — Encounter: Payer: Self-pay | Admitting: Gastroenterology

## 2014-02-10 ENCOUNTER — Non-Acute Institutional Stay (SKILLED_NURSING_FACILITY): Payer: Medicare Other | Admitting: Adult Health

## 2014-02-10 ENCOUNTER — Other Ambulatory Visit: Payer: Self-pay | Admitting: *Deleted

## 2014-02-10 DIAGNOSIS — G459 Transient cerebral ischemic attack, unspecified: Secondary | ICD-10-CM

## 2014-02-10 DIAGNOSIS — I5043 Acute on chronic combined systolic (congestive) and diastolic (congestive) heart failure: Secondary | ICD-10-CM

## 2014-02-10 DIAGNOSIS — E1159 Type 2 diabetes mellitus with other circulatory complications: Secondary | ICD-10-CM

## 2014-02-10 DIAGNOSIS — Z89431 Acquired absence of right foot: Secondary | ICD-10-CM

## 2014-02-10 DIAGNOSIS — E1151 Type 2 diabetes mellitus with diabetic peripheral angiopathy without gangrene: Secondary | ICD-10-CM

## 2014-02-10 DIAGNOSIS — F329 Major depressive disorder, single episode, unspecified: Secondary | ICD-10-CM

## 2014-02-10 DIAGNOSIS — K219 Gastro-esophageal reflux disease without esophagitis: Secondary | ICD-10-CM

## 2014-02-10 DIAGNOSIS — E785 Hyperlipidemia, unspecified: Secondary | ICD-10-CM

## 2014-02-10 DIAGNOSIS — F4321 Adjustment disorder with depressed mood: Secondary | ICD-10-CM

## 2014-02-10 MED ORDER — HYDROCODONE-ACETAMINOPHEN 5-325 MG PO TABS: ORAL_TABLET | ORAL | Status: DC

## 2014-02-10 NOTE — Telephone Encounter (Signed)
[name]

## 2014-03-01 ENCOUNTER — Emergency Department (HOSPITAL_COMMUNITY): Payer: Medicare Other

## 2014-03-01 ENCOUNTER — Encounter (HOSPITAL_COMMUNITY): Payer: Self-pay

## 2014-03-01 ENCOUNTER — Inpatient Hospital Stay (HOSPITAL_COMMUNITY)
Admission: EM | Admit: 2014-03-01 | Discharge: 2014-03-12 | DRG: 871 | Disposition: A | Discharge status: Skilled Nursing Facility | Payer: Medicare Other | Attending: Internal Medicine | Admitting: Internal Medicine

## 2014-03-01 DIAGNOSIS — I499 Cardiac arrhythmia, unspecified: Secondary | ICD-10-CM | POA: Diagnosis not present

## 2014-03-01 DIAGNOSIS — G459 Transient cerebral ischemic attack, unspecified: Secondary | ICD-10-CM | POA: Diagnosis present

## 2014-03-01 DIAGNOSIS — I10 Essential (primary) hypertension: Secondary | ICD-10-CM | POA: Diagnosis not present

## 2014-03-01 DIAGNOSIS — Z794 Long term (current) use of insulin: Secondary | ICD-10-CM

## 2014-03-01 DIAGNOSIS — H540 Blindness, both eyes: Secondary | ICD-10-CM | POA: Diagnosis present

## 2014-03-01 DIAGNOSIS — Z87891 Personal history of nicotine dependence: Secondary | ICD-10-CM

## 2014-03-01 DIAGNOSIS — N179 Acute kidney failure, unspecified: Secondary | ICD-10-CM

## 2014-03-01 DIAGNOSIS — E1159 Type 2 diabetes mellitus with other circulatory complications: Secondary | ICD-10-CM

## 2014-03-01 DIAGNOSIS — Z87440 Personal history of urinary (tract) infections: Secondary | ICD-10-CM

## 2014-03-01 DIAGNOSIS — Z7401 Bed confinement status: Secondary | ICD-10-CM

## 2014-03-01 DIAGNOSIS — R0902 Hypoxemia: Secondary | ICD-10-CM

## 2014-03-01 DIAGNOSIS — I739 Peripheral vascular disease, unspecified: Secondary | ICD-10-CM | POA: Diagnosis present

## 2014-03-01 DIAGNOSIS — R627 Adult failure to thrive: Secondary | ICD-10-CM | POA: Diagnosis present

## 2014-03-01 DIAGNOSIS — Z89421 Acquired absence of other right toe(s): Secondary | ICD-10-CM

## 2014-03-01 DIAGNOSIS — I5032 Chronic diastolic (congestive) heart failure: Secondary | ICD-10-CM | POA: Diagnosis present

## 2014-03-01 DIAGNOSIS — K219 Gastro-esophageal reflux disease without esophagitis: Secondary | ICD-10-CM | POA: Diagnosis present

## 2014-03-01 DIAGNOSIS — E875 Hyperkalemia: Secondary | ICD-10-CM

## 2014-03-01 DIAGNOSIS — Z7982 Long term (current) use of aspirin: Secondary | ICD-10-CM

## 2014-03-01 DIAGNOSIS — E11319 Type 2 diabetes mellitus with unspecified diabetic retinopathy without macular edema: Secondary | ICD-10-CM | POA: Diagnosis present

## 2014-03-01 DIAGNOSIS — Z79899 Other long term (current) drug therapy: Secondary | ICD-10-CM

## 2014-03-01 DIAGNOSIS — I129 Hypertensive chronic kidney disease with stage 1 through stage 4 chronic kidney disease, or unspecified chronic kidney disease: Secondary | ICD-10-CM | POA: Diagnosis present

## 2014-03-01 DIAGNOSIS — Z89431 Acquired absence of right foot: Secondary | ICD-10-CM

## 2014-03-01 DIAGNOSIS — N390 Urinary tract infection, site not specified: Secondary | ICD-10-CM

## 2014-03-01 DIAGNOSIS — Z6837 Body mass index (BMI) 37.0-37.9, adult: Secondary | ICD-10-CM

## 2014-03-01 DIAGNOSIS — F0390 Unspecified dementia without behavioral disturbance: Secondary | ICD-10-CM | POA: Diagnosis present

## 2014-03-01 DIAGNOSIS — E669 Obesity, unspecified: Secondary | ICD-10-CM | POA: Diagnosis present

## 2014-03-01 DIAGNOSIS — A419 Sepsis, unspecified organism: Principal | ICD-10-CM | POA: Diagnosis present

## 2014-03-01 DIAGNOSIS — N184 Chronic kidney disease, stage 4 (severe): Secondary | ICD-10-CM | POA: Diagnosis present

## 2014-03-01 DIAGNOSIS — E1151 Type 2 diabetes mellitus with diabetic peripheral angiopathy without gangrene: Secondary | ICD-10-CM | POA: Diagnosis present

## 2014-03-01 DIAGNOSIS — Z9049 Acquired absence of other specified parts of digestive tract: Secondary | ICD-10-CM | POA: Diagnosis present

## 2014-03-01 DIAGNOSIS — N189 Chronic kidney disease, unspecified: Secondary | ICD-10-CM

## 2014-03-01 DIAGNOSIS — D649 Anemia, unspecified: Secondary | ICD-10-CM | POA: Diagnosis present

## 2014-03-01 DIAGNOSIS — Z8673 Personal history of transient ischemic attack (TIA), and cerebral infarction without residual deficits: Secondary | ICD-10-CM

## 2014-03-01 DIAGNOSIS — G9340 Encephalopathy, unspecified: Secondary | ICD-10-CM | POA: Diagnosis present

## 2014-03-01 LAB — CBC WITH DIFFERENTIAL/PLATELET
BASOS ABS: 0 10*3/uL (ref 0.0–0.1)
Basophils Relative: 0 % (ref 0–1)
EOS PCT: 0 % (ref 0–5)
Eosinophils Absolute: 0.1 10*3/uL (ref 0.0–0.7)
HEMATOCRIT: 30.9 % — AB (ref 36.0–46.0)
Hemoglobin: 10.1 g/dL — ABNORMAL LOW (ref 12.0–15.0)
Lymphocytes Relative: 7 % — ABNORMAL LOW (ref 12–46)
Lymphs Abs: 1.1 10*3/uL (ref 0.7–4.0)
MCH: 27.9 pg (ref 26.0–34.0)
MCHC: 32.7 g/dL (ref 30.0–36.0)
MCV: 85.4 fL (ref 78.0–100.0)
MONO ABS: 0.7 10*3/uL (ref 0.1–1.0)
Monocytes Relative: 4 % (ref 3–12)
NEUTROS PCT: 89 % — AB (ref 43–77)
Neutro Abs: 14.2 10*3/uL — ABNORMAL HIGH (ref 1.7–7.7)
Platelets: 311 10*3/uL (ref 150–400)
RBC: 3.62 MIL/uL — ABNORMAL LOW (ref 3.87–5.11)
RDW: 15.2 % (ref 11.5–15.5)
WBC: 16 10*3/uL — AB (ref 4.0–10.5)

## 2014-03-01 LAB — BASIC METABOLIC PANEL
ANION GAP: 13 (ref 5–15)
BUN: 70 mg/dL — ABNORMAL HIGH (ref 6–23)
CALCIUM: 9 mg/dL (ref 8.4–10.5)
CO2: 20 mmol/L (ref 19–32)
Chloride: 104 mmol/L (ref 96–112)
Creatinine, Ser: 4.97 mg/dL — ABNORMAL HIGH (ref 0.50–1.10)
GFR calc Af Amer: 9 mL/min — ABNORMAL LOW (ref >90)
GFR calc non Af Amer: 8 mL/min — ABNORMAL LOW (ref >90)
GLUCOSE: 102 mg/dL — AB (ref 70–99)
Potassium: 5.6 mmol/L — ABNORMAL HIGH (ref 3.5–5.1)
Sodium: 137 mmol/L (ref 135–145)

## 2014-03-01 LAB — URINALYSIS, ROUTINE W REFLEX MICROSCOPIC
Bilirubin Urine: NEGATIVE
GLUCOSE, UA: NEGATIVE mg/dL
KETONES UR: NEGATIVE mg/dL
NITRITE: NEGATIVE
PROTEIN: 100 mg/dL — AB
SPECIFIC GRAVITY, URINE: 1.016 (ref 1.005–1.030)
Urobilinogen, UA: 1 mg/dL (ref 0.0–1.0)
pH: 8 (ref 5.0–8.0)

## 2014-03-01 LAB — URINE MICROSCOPIC-ADD ON

## 2014-03-01 LAB — GLUCOSE, CAPILLARY: Glucose-Capillary: 118 mg/dL — ABNORMAL HIGH (ref 70–99)

## 2014-03-01 MED ORDER — HYDROCORTISONE 2.5 % RE CREA
1.0000 "application " | TOPICAL_CREAM | Freq: Two times a day (BID) | RECTAL | Status: DC | PRN
  Filled 2014-03-01: qty 28.35

## 2014-03-01 MED ORDER — ATORVASTATIN CALCIUM 10 MG PO TABS
10.0000 mg | ORAL_TABLET | Freq: Every day | ORAL | Status: DC
  Filled 2014-03-01 (×2): qty 1

## 2014-03-01 MED ORDER — DEXTROSE 50 % IV SOLN
1.0000 | Freq: Once | INTRAVENOUS | Status: AC
  Administered 2014-03-01: 50 mL via INTRAVENOUS
  Filled 2014-03-01: qty 50

## 2014-03-01 MED ORDER — PIPERACILLIN-TAZOBACTAM 3.375 G IVPB
3.3750 g | Freq: Three times a day (TID) | INTRAVENOUS | Status: AC
  Administered 2014-03-02 (×2): 3.375 g via INTRAVENOUS
  Filled 2014-03-01 (×4): qty 50

## 2014-03-01 MED ORDER — INSULIN ASPART 100 UNIT/ML IV SOLN
5.0000 [IU] | Freq: Once | INTRAVENOUS | Status: AC
  Administered 2014-03-01: 5 [IU] via INTRAVENOUS
  Filled 2014-03-01: qty 1

## 2014-03-01 MED ORDER — METOCLOPRAMIDE HCL 5 MG PO TABS
5.0000 mg | ORAL_TABLET | Freq: Three times a day (TID) | ORAL | Status: DC
  Filled 2014-03-01 (×4): qty 1

## 2014-03-01 MED ORDER — ADULT MULTIVITAMIN W/MINERALS CH
1.0000 | ORAL_TABLET | Freq: Every day | ORAL | Status: DC
  Filled 2014-03-01 (×2): qty 1

## 2014-03-01 MED ORDER — CARVEDILOL 3.125 MG PO TABS
3.1250 mg | ORAL_TABLET | Freq: Two times a day (BID) | ORAL | Status: DC
  Filled 2014-03-01 (×3): qty 1

## 2014-03-01 MED ORDER — COLLAGENASE 250 UNIT/GM EX OINT
TOPICAL_OINTMENT | Freq: Every day | CUTANEOUS | Status: DC
  Administered 2014-03-01 – 2014-03-07 (×7): via TOPICAL
  Administered 2014-03-08: 1 via TOPICAL
  Administered 2014-03-09 – 2014-03-12 (×4): via TOPICAL
  Filled 2014-03-01: qty 30

## 2014-03-01 MED ORDER — ENSURE PUDDING PO PUDG
1.0000 | Freq: Two times a day (BID) | ORAL | Status: DC
Start: 2014-03-02 — End: 2014-03-12
  Administered 2014-03-03 – 2014-03-12 (×12): 1 via ORAL

## 2014-03-01 MED ORDER — IPRATROPIUM-ALBUTEROL 0.5-2.5 (3) MG/3ML IN SOLN: 3.0000 mL | Freq: Four times a day (QID) | RESPIRATORY_TRACT | Status: DC | PRN

## 2014-03-01 MED ORDER — ONDANSETRON HCL 4 MG/2ML IJ SOLN: 4.0000 mg | Freq: Four times a day (QID) | INTRAMUSCULAR | Status: DC | PRN

## 2014-03-01 MED ORDER — SODIUM CHLORIDE 0.9 % IV SOLN
1.0000 g | Freq: Once | INTRAVENOUS | Status: AC
  Administered 2014-03-01: 1 g via INTRAVENOUS
  Filled 2014-03-01: qty 10

## 2014-03-01 MED ORDER — PIPERACILLIN-TAZOBACTAM 3.375 G IVPB
3.3750 g | Freq: Once | INTRAVENOUS | Status: AC
  Administered 2014-03-01: 3.375 g via INTRAVENOUS
  Filled 2014-03-01: qty 50

## 2014-03-01 MED ORDER — SALINE SPRAY 0.65 % NA SOLN: 1.0000 | Freq: Four times a day (QID) | NASAL | Status: DC | PRN

## 2014-03-01 MED ORDER — ONDANSETRON HCL 4 MG PO TABS: 4.0000 mg | ORAL_TABLET | Freq: Four times a day (QID) | ORAL | Status: DC | PRN

## 2014-03-01 MED ORDER — HEPARIN SODIUM (PORCINE) 5000 UNIT/ML IJ SOLN
5000.0000 [IU] | Freq: Three times a day (TID) | INTRAMUSCULAR | Status: DC
  Administered 2014-03-01 – 2014-03-12 (×31): 5000 [IU] via SUBCUTANEOUS
  Filled 2014-03-01 (×35): qty 1

## 2014-03-01 MED ORDER — DULOXETINE HCL 30 MG PO CPEP
30.0000 mg | ORAL_CAPSULE | Freq: Every day | ORAL | Status: DC
  Filled 2014-03-01 (×2): qty 1

## 2014-03-01 MED ORDER — INSULIN ASPART 100 UNIT/ML IV SOLN: 10.0000 [IU] | Freq: Once | INTRAVENOUS | Status: DC

## 2014-03-01 MED ORDER — DOCUSATE SODIUM 100 MG PO CAPS
100.0000 mg | ORAL_CAPSULE | Freq: Two times a day (BID) | ORAL | Status: DC
  Filled 2014-03-01 (×2): qty 1

## 2014-03-01 MED ORDER — TIMOLOL MALEATE 0.5 % OP SOLN
1.0000 [drp] | Freq: Two times a day (BID) | OPHTHALMIC | Status: DC
  Administered 2014-03-01 – 2014-03-12 (×22): 1 [drp] via OPHTHALMIC
  Filled 2014-03-01: qty 5

## 2014-03-01 MED ORDER — GABAPENTIN 300 MG PO CAPS
300.0000 mg | ORAL_CAPSULE | Freq: Every day | ORAL | Status: DC
  Filled 2014-03-01 (×2): qty 1

## 2014-03-01 MED ORDER — SODIUM CHLORIDE 0.9 % IV SOLN
INTRAVENOUS | Status: DC
  Administered 2014-03-01: 22:00:00 via INTRAVENOUS

## 2014-03-01 MED ORDER — VITAMIN B-1 100 MG PO TABS
100.0000 mg | ORAL_TABLET | Freq: Every day | ORAL | Status: DC
  Filled 2014-03-01 (×2): qty 1

## 2014-03-01 MED ORDER — SODIUM POLYSTYRENE SULFONATE 15 GM/60ML PO SUSP
30.0000 g | Freq: Once | ORAL | Status: AC
  Administered 2014-03-01: 30 g via ORAL

## 2014-03-01 MED ORDER — INSULIN ASPART 100 UNIT/ML ~~LOC~~ SOLN: 0.0000 [IU] | SUBCUTANEOUS | Status: DC

## 2014-03-01 MED ORDER — BENEPROTEIN PO POWD
1.0000 | Freq: Three times a day (TID) | ORAL | Status: DC
  Administered 2014-03-03 – 2014-03-12 (×21): 6 g via ORAL
  Filled 2014-03-01: qty 227

## 2014-03-01 MED ORDER — SODIUM CHLORIDE 0.9 % IV BOLUS (SEPSIS)
1000.0000 mL | Freq: Once | INTRAVENOUS | Status: AC
  Administered 2014-03-01: 1000 mL via INTRAVENOUS

## 2014-03-01 MED ORDER — SODIUM POLYSTYRENE SULFONATE 15 GM/60ML PO SUSP
30.0000 g | Freq: Once | ORAL | Status: DC
  Filled 2014-03-01: qty 120

## 2014-03-01 MED ORDER — DEXTROSE 5 % IV SOLN
1.0000 g | Freq: Once | INTRAVENOUS | Status: AC
  Administered 2014-03-01: 1 g via INTRAVENOUS
  Filled 2014-03-01: qty 10

## 2014-03-01 MED ORDER — POLYETHYLENE GLYCOL 3350 17 G PO PACK
17.0000 g | PACK | Freq: Every day | ORAL | Status: DC
Start: 2014-03-01 — End: 2014-03-02
  Filled 2014-03-01 (×2): qty 1

## 2014-03-01 MED ORDER — CETYLPYRIDINIUM CHLORIDE 0.05 % MT LIQD
7.0000 mL | Freq: Two times a day (BID) | OROMUCOSAL | Status: DC
  Administered 2014-03-01 – 2014-03-12 (×21): 7 mL via OROMUCOSAL

## 2014-03-01 MED ORDER — FOLIC ACID 1 MG PO TABS
1.0000 mg | ORAL_TABLET | Freq: Every day | ORAL | Status: DC
  Filled 2014-03-01 (×2): qty 1

## 2014-03-01 MED ORDER — ASPIRIN EC 325 MG PO TBEC
325.0000 mg | DELAYED_RELEASE_TABLET | Freq: Every day | ORAL | Status: DC
  Filled 2014-03-01 (×2): qty 1

## 2014-03-01 NOTE — H&P (Signed)
[facility] History and Physical  [name] UEA:[id number] DOB: Apr 27, 1940 DOA: 03/01/2014  Referring physician: Ernestina Patches, MD PCP: Hollace Kinnier, DO   Chief Complaint: Hyperkalemia  HPI: [name] is a 74 y.o. female presents with UTI. Patient is not cooperating with a history. She is a [facility] and was sent here originally because of hyperkalemia. In the ED she is not really provided much of a history but reportedly she has been not herself per the staff at [facility]. She has been not taking po very well. She has been also not drinking very well. She has had increased creatinine from [name] baseline. In addition she was noted to have a UTI here in the ED. She does also have a history of CHF CKD and recurring Klebsiella UTI. Patient is also legally blind.   Review of Systems:  Patient is not able to provide a ROS  Past Medical History  Diagnosis Date  . Hypertension   . Diabetes mellitus   . Blindness   . TIA (transient ischemic attack)   . Anemia   . GERD (gastroesophageal reflux disease)   . Constipation   . Renal disorder 05/2012    acute Renal Failure  . SYNCOPE 05/19/2009    Annotation: multiple over past 1 yr  Qualifier: Diagnosis of  By: [name] MD, [name]    . CHOLECYSTECTOMY, HX OF 05/19/2009    Qualifier: Diagnosis of  By: [name] MD, [name]     Past Surgical History  Procedure Laterality Date  . Gallbladder surgery    . Esophageal dilation    . Cholecystectomy    . Amputation  02/18/2012    Procedure: AMPUTATION RAY;  Surgeon: [name], MD;  Location: [facility];  Service: Orthopedics;  Laterality: Right;  right second toe  . Tee without cardioversion  02/21/2012    Procedure: TRANSESOPHAGEAL ECHOCARDIOGRAM (TEE);  Surgeon: [name], MD;  Location: [facility] ENDOSCOPY;  Service: Cardiovascular;  Laterality: N/A;   spoke with [name] from [place] pt will arrive by 815ish( thy change shifts at [place])  . Tee without cardioversion   02/25/2012    Procedure: TRANSESOPHAGEAL ECHOCARDIOGRAM (TEE);  Surgeon: [name], MD;  Location: [place];  Service: Cardiovascular;  Laterality: N/A;  . Amputation Right 09/07/2013    Procedure: AMPUTATION RAY;  Surgeon: [name], MD;  Location: [place];  Service: Orthopedics;  Laterality: Right;  Right Transmetatarsal Amputation   Social History:  reports that she quit smoking about 14 years ago. Her smokeless tobacco use includes Snuff. She reports that she does not drink alcohol or use illicit drugs.  No Known Allergies  Family History  Problem Relation Age of Onset  . Heart failure Mother      Prior to Admission medications   Medication Sig Start Date End Date Taking? Authorizing Provider  acetaminophen (TYLENOL) 325 MG tablet Take 650 mg by mouth every 6 (six) hours as needed for fever.     Historical Provider, MD  aspirin EC 325 MG tablet Take 325 mg by mouth daily.      Historical Provider, MD  atorvastatin (LIPITOR) 10 MG tablet Take 1 tablet (10 mg total) by mouth daily. 01/25/14   Monica Shamsid-Deen Carter, DO  Brinzolamide-Brimonidine [facility]) 1-0.2 % SUSP Place 1 drop into the left eye 2 (two) times daily.    Historical Provider, MD  carvedilol (COREG) 3.125 MG tablet Take 3.125 mg by mouth 2 (two) times daily with a meal.  Historical Provider, MD  collagenase (SANTYL) ointment Apply topically daily. 12/21/13   [name], MD  docusate sodium (COLACE) 100 MG capsule Take 100 mg by mouth 2 (two) times daily.    Historical Provider, MD  DULoxetine (CYMBALTA) 60 MG capsule Take 1 capsule (60 mg total) by mouth daily. When completes 2 wks at 30mg  Patient taking differently: Take 30 mg by mouth daily. On 01-23-14: being 60 mg daily 09/19/13   [name], DO  feeding supplement, ENSURE, (ENSURE) PUDG Take 1 Container by mouth 2 (two) times daily between meals. 01/07/14   [name], MD  gabapentin (NEURONTIN) 100 MG capsule Take 3 capsules (300 mg total) by  mouth at bedtime. Phantom pain 01/07/14   [name], MD  HYDROcodone-acetaminophen (NORCO/VICODIN) 5-325 MG per tablet Take one tablet by mouth every 6 hours as needed for pain NOT TO EXCEED 3000 MG OF APAP 02/10/14   Monica Shamsid-Deen Carter, DO  hydrocortisone (ANUSOL-HC) 2.5 % rectal cream Place 1 application rectally 2 (two) times daily as needed for hemorrhoids.     Historical Provider, MD  insulin aspart (NOVOLOG) 100 UNIT/ML injection 0-9 Units, Subcutaneous, 3 times daily with meals,  CBG < 70: implement hypoglycemia protocol CBG 70 - 120: 0 units CBG 121 - 150: 1 unit CBG 151 - 200: 2 units CBG 201 - 250: 3 units CBG 251 - 300: 5 units CBG 301 - 350: 7 units CBG 351 - 400: 9 units CBG > 400: call MD Patient taking differently: Inject 5 Units into the skin 3 (three) times daily with meals. 5 units prior to meals for cbg >=150 12/21/13   [name], MD  ipratropium-albuterol (DUONEB) 0.5-2.5 (3) MG/3ML SOLN Take 3 mLs by nebulization every 6 (six) hours as needed (wheezing).    Historical Provider, MD  metoCLOPramide (REGLAN) 5 MG tablet Take 1 tablet (5 mg total) by mouth 4 (four) times daily -  before meals and at bedtime. 12/22/13   [name], MD  Multiple Vitamins-Minerals (MULTIVITAMIN WITH MINERALS) tablet Take 1 tablet by mouth daily.    Historical Provider, MD  polyethylene glycol (MIRALAX / GLYCOLAX) packet Take 17 g by mouth daily. 06/18/12   [name], MD  promethazine (PHENERGAN) 25 MG tablet Take 25 mg by mouth every 6 (six) hours as needed for nausea or vomiting.    Historical Provider, MD  protein supplement (RESOURCE BENEPROTEIN) POWD Take 1 scoop by mouth 3 (three) times daily with meals.    Historical Provider, MD  sodium chloride (OCEAN) 0.65 % SOLN nasal spray Place 1 spray into the nose 4 (four) times daily as needed for congestion. 06/29/11   [name], MD  timolol (TIMOPTIC) 0.5 % ophthalmic solution Place 1 drop into the left eye 2 (two) times  daily.    Historical Provider, MD   Physical Exam: Filed Vitals:   03/01/14 1706 03/01/14 1709 03/01/14 1800 03/01/14 1832  BP:  126/52 133/96 108/77  Pulse:  65 63 66  Temp:  98.3 F (36.8 C)    TempSrc:  Oral    Resp:  15 15 15   SpO2: 96% 97% 100% 100%    Wt Readings from Last 3 Encounters:  01/19/14 85.73 kg (189 lb)  01/08/14 86.637 kg (191 lb)  01/07/14 87.5 kg (192 lb 14.4 oz)    General:  Appears agitated but comfortable Eyes: PERRL, normal lids ENT: grossly normal hearing, lips & tongue Neck: no LAD, masses or thyromegaly Cardiovascular:  RRR, no m/r/g. No LE edema. Respiratory: CTA bilaterally, no w/r/r. Normal respiratory effort. Abdomen: soft, ntnd Skin: no rash or induration seen on limited exam Musculoskeletal: grossly normal tone BUE/BLE Psychiatric: unable to assess Neurologic: moves all 4 extremities.          Labs on Admission:  Basic Metabolic Panel:  Recent Labs Lab 03/01/14 1728  NA 137  K 5.6*  CL 104  CO2 20  GLUCOSE 102*  BUN 70*  CREATININE 4.97*  CALCIUM 9.0   Liver Function Tests: No results for input(s): AST, ALT, ALKPHOS, BILITOT, PROT, ALBUMIN in the last 168 hours. No results for input(s): LIPASE, AMYLASE in the last 168 hours. No results for input(s): AMMONIA in the last 168 hours. CBC:  Recent Labs Lab 03/01/14 1728  WBC 16.0*  NEUTROABS 14.2*  HGB 10.1*  HCT 30.9*  MCV 85.4  PLT 311   Cardiac Enzymes: No results for input(s): CKTOTAL, CKMB, CKMBINDEX, TROPONINI in the last 168 hours.  BNP (last 3 results) No results for input(s): BNP in the last 8760 hours.  ProBNP (last 3 results)  Recent Labs  12/26/13 1640 12/27/13 0040  PROBNP 5360.0* 4645.0*    CBG: No results for input(s): GLUCAP in the last 168 hours.  Radiological Exams on Admission: Dg Chest 2 View  03/01/2014   CLINICAL DATA:  High potassium. History of diabetes and heart failure.  EXAM: CHEST  2 VIEW  COMPARISON:  01/01/2014  FINDINGS: Mild  cardiomegaly and vascular congestion. Low lung volumes. No confluent opacities, effusions or edema. No acute bony abnormality.  IMPRESSION: Cardiomegaly, vascular congestion.   Electronically Signed   By: Rolm Baptise M.D.   On: 03/01/2014 19:12     Assessment/Plan Active Problems:   Type 2 diabetes, controlled, with peripheral circulatory disorder   Essential hypertension   UTI (lower urinary tract infection)   Acute on chronic renal failure   Hyperkalemia   1. Hyperkalemia -attempted to give kayexalate she would not swallow -will give D50 and insulin -repeat potassium at 11pm   2. UTI -She has a history of resistant klebsiella -will get pharmamcy consult for suggestions -will follow up cultures  3. HTN -monitor pressures -will continue with home medications  4. Type 2 Diabetes Mellitus -will continue with FSBS -continue with home medications  -check FSBS  5. Acute on Chronic Renal Failure -will hydrate gently -monitor labs   Code Status: Full Code (must indicate code status--if unknown or must be presumed, indicate so) DVT Prophylaxis:heparin Family Communication:  (indicate person spoken with, if applicable, with phone number if by telephone) Disposition Plan: SNF (indicate anticipated LOS)  Time spent: 37min  KHAN,SAADAT A [facility] Pager [phone]

## 2014-03-01 NOTE — ED Notes (Signed)
Report called to floor

## 2014-03-01 NOTE — ED Provider Notes (Signed)
CSN: [id number]     Arrival date & time 03/01/14  1655 History   First MD Initiated Contact with Patient 03/01/14 1711     Chief Complaint  Patient presents with  . Abnormal Lab     (Consider location/radiation/quality/duration/timing/severity/associated sxs/prior Treatment) HPI Comments: Patient sent from [facility] with report of critically high potassium of greater than 6 and hallucinations. Staff tried to give her Kayexalate.  The patient would not swallow it. She states he has been feeling "bad" but cannot elaborate and also that she has had "blood down there"  The history is provided by a caregiver. The history is limited by the condition of the patient and the absence of a caregiver. No language interpreter was used.    Past Medical History  Diagnosis Date  . Hypertension   . Diabetes mellitus   . Blindness   . TIA (transient ischemic attack)   . Anemia   . GERD (gastroesophageal reflux disease)   . Constipation   . Renal disorder 05/2012    acute Renal Failure  . SYNCOPE 05/19/2009    Annotation: multiple over past 1 yr  Qualifier: Diagnosis of  By: [name] MD, [name]    . CHOLECYSTECTOMY, HX OF 05/19/2009    Qualifier: Diagnosis of  By: [name] MD, [name]     Past Surgical History  Procedure Laterality Date  . Gallbladder surgery    . Esophageal dilation    . Cholecystectomy    . Amputation  02/18/2012    Procedure: AMPUTATION RAY;  Surgeon: [name], MD;  Location: [facility];  Service: Orthopedics;  Laterality: Right;  right second toe  . Tee without cardioversion  02/21/2012    Procedure: TRANSESOPHAGEAL ECHOCARDIOGRAM (TEE);  Surgeon: [name], MD;  Location: [facility] ENDOSCOPY;  Service: Cardiovascular;  Laterality: N/A;   spoke with [name] from [place] pt will arrive by 815ish( thy change shifts at [place])  . Tee without cardioversion  02/25/2012    Procedure: TRANSESOPHAGEAL ECHOCARDIOGRAM (TEE);  Surgeon: [name], MD;  Location: [place];  Service:  Cardiovascular;  Laterality: N/A;  . Amputation Right 09/07/2013    Procedure: AMPUTATION RAY;  Surgeon: [name], MD;  Location: [place];  Service: Orthopedics;  Laterality: Right;  Right Transmetatarsal Amputation   Family History  Problem Relation Age of Onset  . Heart failure Mother    History  Substance Use Topics  . Smoking status: Former Smoker -- 0 years    Quit date: 06/25/1999  . Smokeless tobacco: Current User    Types: Snuff  . Alcohol Use: No   OB History    No data available     Review of Systems  Unable to perform ROS: Mental status change      Allergies  Review of patient's allergies indicates no known allergies.  Home Medications   Prior to Admission medications   Medication Sig Start Date End Date Taking? Authorizing Provider  acetaminophen (TYLENOL) 325 MG tablet Take 650 mg by mouth every 6 (six) hours as needed for fever.     Historical Provider, MD  aspirin EC 325 MG tablet Take 325 mg by mouth daily.      Historical Provider, MD  atorvastatin (LIPITOR) 10 MG tablet Take 1 tablet (10 mg total) by mouth daily. 01/25/14   Monica Shamsid-Deen Carter, DO  Brinzolamide-Brimonidine [facility]) 1-0.2 % SUSP Place 1 drop into the left eye 2 (two) times daily.    Historical Provider, MD  carvedilol (COREG) 3.125 MG  tablet Take 3.125 mg by mouth 2 (two) times daily with a meal.    Historical Provider, MD  collagenase (SANTYL) ointment Apply topically daily. 12/21/13   [name], MD  docusate sodium (COLACE) 100 MG capsule Take 100 mg by mouth 2 (two) times daily.    Historical Provider, MD  DULoxetine (CYMBALTA) 60 MG capsule Take 1 capsule (60 mg total) by mouth daily. When completes 2 wks at 30mg  Patient taking differently: Take 30 mg by mouth daily. On 01-23-14: being 60 mg daily 09/19/13   [name], DO  feeding supplement, ENSURE, (ENSURE) PUDG Take 1 Container by mouth 2 (two) times daily between meals. 01/07/14   [name], MD  gabapentin  (NEURONTIN) 100 MG capsule Take 3 capsules (300 mg total) by mouth at bedtime. Phantom pain 01/07/14   [name], MD  HYDROcodone-acetaminophen (NORCO/VICODIN) 5-325 MG per tablet Take one tablet by mouth every 6 hours as needed for pain NOT TO EXCEED 3000 MG OF APAP 02/10/14   Monica Shamsid-Deen Carter, DO  hydrocortisone (ANUSOL-HC) 2.5 % rectal cream Place 1 application rectally 2 (two) times daily as needed for hemorrhoids.     Historical Provider, MD  insulin aspart (NOVOLOG) 100 UNIT/ML injection 0-9 Units, Subcutaneous, 3 times daily with meals,  CBG < 70: implement hypoglycemia protocol CBG 70 - 120: 0 units CBG 121 - 150: 1 unit CBG 151 - 200: 2 units CBG 201 - 250: 3 units CBG 251 - 300: 5 units CBG 301 - 350: 7 units CBG 351 - 400: 9 units CBG > 400: call MD Patient taking differently: Inject 5 Units into the skin 3 (three) times daily with meals. 5 units prior to meals for cbg >=150 12/21/13   [name], MD  ipratropium-albuterol (DUONEB) 0.5-2.5 (3) MG/3ML SOLN Take 3 mLs by nebulization every 6 (six) hours as needed (wheezing).    Historical Provider, MD  metoCLOPramide (REGLAN) 5 MG tablet Take 1 tablet (5 mg total) by mouth 4 (four) times daily -  before meals and at bedtime. 12/22/13   [name], MD  Multiple Vitamins-Minerals (MULTIVITAMIN WITH MINERALS) tablet Take 1 tablet by mouth daily.    Historical Provider, MD  polyethylene glycol (MIRALAX / GLYCOLAX) packet Take 17 g by mouth daily. 06/18/12   [name], MD  promethazine (PHENERGAN) 25 MG tablet Take 25 mg by mouth every 6 (six) hours as needed for nausea or vomiting.    Historical Provider, MD  protein supplement (RESOURCE BENEPROTEIN) POWD Take 1 scoop by mouth 3 (three) times daily with meals.    Historical Provider, MD  sodium chloride (OCEAN) 0.65 % SOLN nasal spray Place 1 spray into the nose 4 (four) times daily as needed for congestion. 06/29/11   [name], MD  timolol (TIMOPTIC) 0.5 %  ophthalmic solution Place 1 drop into the left eye 2 (two) times daily.    Historical Provider, MD   BP 118/52 mmHg  Pulse 66  Temp(Src) 98.6 F (37 C) (Axillary)  Resp 16  SpO2 100% Physical Exam  Constitutional: She is oriented to person, place, and time. She appears well-developed and well-nourished. No distress.  HENT:  Head: Normocephalic and atraumatic.  Mouth/Throat: No oropharyngeal exudate.  Eyes: Pupils are equal, round, and reactive to light.  Neck: Normal range of motion. Neck supple.  Cardiovascular: Normal rate, regular rhythm and normal heart sounds.  Exam reveals no gallop and no friction rub.   No murmur heard. Pulmonary/Chest:  Effort normal and breath sounds normal. No respiratory distress. She has no wheezes. She has no rales.  Abdominal: Soft. Bowel sounds are normal. She exhibits no distension and no mass. There is no tenderness. There is no rebound and no guarding.  Musculoskeletal: Normal range of motion. She exhibits no edema or tenderness.  Neurological: She is alert and oriented to person, place, and time. No sensory deficit. GCS eye subscore is 4. GCS verbal subscore is 4. GCS motor subscore is 6.  4/5 strength throughout  Skin: Skin is warm and dry.  Psychiatric: She has a normal mood and affect.    ED Course  Procedures (including critical care time) Labs Review Labs Reviewed  CBC WITH DIFFERENTIAL/PLATELET - Abnormal; Notable for the following:    WBC 16.0 (*)    RBC 3.62 (*)    Hemoglobin 10.1 (*)    HCT 30.9 (*)    Neutrophils Relative % 89 (*)    Neutro Abs 14.2 (*)    Lymphocytes Relative 7 (*)    All other components within normal limits  BASIC METABOLIC PANEL - Abnormal; Notable for the following:    Potassium 5.6 (*)    Glucose, Bld 102 (*)    BUN 70 (*)    Creatinine, Ser 4.97 (*)    GFR calc non Af Amer 8 (*)    GFR calc Af Amer 9 (*)    All other components within normal limits  URINALYSIS, ROUTINE W REFLEX MICROSCOPIC - Abnormal;  Notable for the following:    APPearance TURBID (*)    Hgb urine dipstick LARGE (*)    Protein, ur 100 (*)    Leukocytes, UA LARGE (*)    All other components within normal limits  URINE MICROSCOPIC-ADD ON - Abnormal; Notable for the following:    Squamous Epithelial / LPF FEW (*)    Bacteria, UA MANY (*)    Crystals TRIPLE PHOSPHATE CRYSTALS (*)    All other components within normal limits  GLUCOSE, CAPILLARY - Abnormal; Notable for the following:    Glucose-Capillary 118 (*)    All other components within normal limits  URINE CULTURE  MRSA PCR SCREENING  HEMOGLOBIN A1C  COMPREHENSIVE METABOLIC PANEL  CBC  TSH  POTASSIUM  POC OCCULT BLOOD, ED    Imaging Review Dg Chest 2 View  03/01/2014   CLINICAL DATA:  High potassium. History of diabetes and heart failure.  EXAM: CHEST  2 VIEW  COMPARISON:  01/01/2014  FINDINGS: Mild cardiomegaly and vascular congestion. Low lung volumes. No confluent opacities, effusions or edema. No acute bony abnormality.  IMPRESSION: Cardiomegaly, vascular congestion.   Electronically Signed   By: Rolm Baptise M.D.   On: 03/01/2014 19:12     EKG Interpretation   Date/Time:  Monday March 01 2014 17:10:50 EST Ventricular Rate:  65 PR Interval:  453 QRS Duration: 126 QT Interval:  449 QTC Calculation: 467 R Axis:   -70 Text Interpretation:  Sinus rhythm Prolonged PR interval Left atrial  enlargement Right bundle branch block Left ventricular hypertrophy  Anterolateral infarct, old Confirmed by Aviannah Castoro  MD, Kathleene Bergemann [phone]) on  03/01/2014 5:14:23 PM      MDM   Final diagnoses:  Hyperkalemia  Acute on chronic renal failure  UTI (lower urinary tract infection)    Pt is a 74 y.o. female with Pmhx as above who presents with skilled nursing facility for report of critically high potassium of greater then 6 and altered mental status staff try to give  patient Sleep.  She wouldn't swallow it.  On physical exam she states that she feels bad but cannot  elaborate.  She states she is bleeding from "down there" Vital signs are stable.  Brown colored stool on rectal exam.  No visible bleeding and GU area.   Per her facility [facility], [place], per nurse states that she has been "going downhill" for several months and has been in and out of the hospital.  Earlier this week she had what sounds like pulmonary edema and was put on increased dose of Lasix.  She has been more confused for the past several days and today had a potassium of 6.2, but would not take Kayexalate by mouth.  She is a ward of the state, [place].  Pt found to have likely UTI with large leukocytes, WBC 16, K 5.6, Cr of 4.97 which is worse than prior of 1.5 1 month ago. [facility] consulted for admission. QRS mildly widened from prior T waves are prominent.  Patient would not take Kayexalate by mouth.  IV fluids, calcium gluconate, insulin and dextrose given for hyperkalemia.     Ernestina Patches, MD 03/02/14 [phone]

## 2014-03-01 NOTE — Progress Notes (Signed)
ANTIBIOTIC CONSULT NOTE - INITIAL  Pharmacy Consult for Zosyn  Indication: UTI   No Known Allergies  Patient Measurements:   Adjusted Body Weight: n/a   Vital Signs: Temp: 98.3 F (36.8 C) (02/01 1709) Temp Source: Oral (02/01 1709) BP: 123/50 mmHg (02/01 2030) Pulse Rate: 69 (02/01 2030) Intake/Output from previous day:   Intake/Output from this shift: Total I/O In: -  Out: 200 [Urine:200]  Labs:  Recent Labs  03/01/14 1728  WBC 16.0*  HGB 10.1*  PLT 311  CREATININE 4.97*   CrCl cannot be calculated (Unknown ideal weight.). No results for input(s): VANCOTROUGH, VANCOPEAK, VANCORANDOM, GENTTROUGH, GENTPEAK, GENTRANDOM, TOBRATROUGH, TOBRAPEAK, TOBRARND, AMIKACINPEAK, AMIKACINTROU, AMIKACIN in the last 72 hours.   Microbiology: No results found for this or any previous visit (from the past 720 hour(s)).  Medical History: Past Medical History  Diagnosis Date  . Hypertension   . Diabetes mellitus   . Blindness   . TIA (transient ischemic attack)   . Anemia   . GERD (gastroesophageal reflux disease)   . Constipation   . Renal disorder 05/2012    acute Renal Failure  . SYNCOPE 05/19/2009    Annotation: multiple over past 1 yr  Qualifier: Diagnosis of  By: [name] MD, [name]    . CHOLECYSTECTOMY, HX OF 05/19/2009    Qualifier: Diagnosis of  By: [name] MD, [name]      Medications:   (Not in a hospital admission) Assessment: 3 YOF who presents to the ED with AMS due to a UTI. She has a history or Klebsiella UTI only sensitive to Zosyn and Imipenem and resistant to Ceftriaxone. Patient received one dose of ceftriaxone in the ED. WBC is elevated at 16. Pt is afebrile. SCr is elevated at 4.97 (BL ~ 2). CrCl 10-15 mL/min   2/1 UCx >>   Goal of Therapy:  Resolution of infection   Plan:  -Start Zosyn 3.375 gm IV Q 8 hours -Monitor renal fx, CBC, cultures and s/s of clinical improvement -May narrow as appropriate once susceptibilities result   Albertina Parr, PharmD., BCPS Clinical Pharmacist Pager [phone]

## 2014-03-01 NOTE — ED Notes (Signed)
Per EMS - pt from [facility], reported critically high potassium of >6. [name] tried to have pt take kayexalate but pt wouldn't swallow it. Hx of diabetes, blindness, heart failure. Pt NSR on monitor, 20G in Left Forearm. Pt alert/oriented to time, place, person. BP 150/90, 65bpm, CBG 135. Pt full code.

## 2014-03-01 NOTE — Progress Notes (Addendum)
New Admission Note:   Arrival Method: via stretcher from ED Mental Orientation: Alert and Oriented x2, Legally blind, HOH bilateral ears Telemetry: Placed on box #13 per MD order Assessment: Completed Skin: Right Foot open wound dressed with santyl and foam; Dry open blister to Left thigh cleansed and OTA IV: Left forearm IV with normal saline at 50cc/hr per MD order Pain: Denies Tubes: N/A Safety Measures: Educated on fall prevention safety plan, patient acknowledged and understood. Admission: Completed [address] [place] Orientation: Patient has been orientated to the room, unit and staff.  Family: none at bedside   Orders have been reviewed and implemented. Will continue to monitor the patient. Call light has been placed within reach and bed alarm has been activated.    Dorothea Glassman, RN  Phone number: [phone]

## 2014-03-01 NOTE — ED Notes (Signed)
Patient placed back on monitor after returning back from xray.

## 2014-03-01 NOTE — ED Notes (Signed)
Admitting MD at bedside when pt unable to swallow Kayexalate. Verbal order to discontinue Kayexalate.

## 2014-03-02 ENCOUNTER — Observation Stay (HOSPITAL_COMMUNITY): Payer: Medicare Other

## 2014-03-02 DIAGNOSIS — K219 Gastro-esophageal reflux disease without esophagitis: Secondary | ICD-10-CM | POA: Diagnosis present

## 2014-03-02 DIAGNOSIS — I5032 Chronic diastolic (congestive) heart failure: Secondary | ICD-10-CM

## 2014-03-02 DIAGNOSIS — E875 Hyperkalemia: Secondary | ICD-10-CM | POA: Diagnosis present

## 2014-03-02 DIAGNOSIS — G9340 Encephalopathy, unspecified: Secondary | ICD-10-CM | POA: Diagnosis present

## 2014-03-02 DIAGNOSIS — H540 Blindness, both eyes: Secondary | ICD-10-CM | POA: Diagnosis present

## 2014-03-02 DIAGNOSIS — I10 Essential (primary) hypertension: Secondary | ICD-10-CM | POA: Diagnosis not present

## 2014-03-02 DIAGNOSIS — D649 Anemia, unspecified: Secondary | ICD-10-CM | POA: Diagnosis present

## 2014-03-02 DIAGNOSIS — N179 Acute kidney failure, unspecified: Secondary | ICD-10-CM | POA: Diagnosis present

## 2014-03-02 DIAGNOSIS — N390 Urinary tract infection, site not specified: Secondary | ICD-10-CM | POA: Diagnosis present

## 2014-03-02 DIAGNOSIS — I739 Peripheral vascular disease, unspecified: Secondary | ICD-10-CM | POA: Diagnosis present

## 2014-03-02 DIAGNOSIS — E669 Obesity, unspecified: Secondary | ICD-10-CM | POA: Diagnosis present

## 2014-03-02 DIAGNOSIS — Z8673 Personal history of transient ischemic attack (TIA), and cerebral infarction without residual deficits: Secondary | ICD-10-CM | POA: Diagnosis not present

## 2014-03-02 DIAGNOSIS — N189 Chronic kidney disease, unspecified: Secondary | ICD-10-CM | POA: Diagnosis not present

## 2014-03-02 DIAGNOSIS — Z9049 Acquired absence of other specified parts of digestive tract: Secondary | ICD-10-CM | POA: Diagnosis present

## 2014-03-02 DIAGNOSIS — E1151 Type 2 diabetes mellitus with diabetic peripheral angiopathy without gangrene: Secondary | ICD-10-CM | POA: Diagnosis present

## 2014-03-02 DIAGNOSIS — Z87440 Personal history of urinary (tract) infections: Secondary | ICD-10-CM | POA: Diagnosis not present

## 2014-03-02 DIAGNOSIS — Z89431 Acquired absence of right foot: Secondary | ICD-10-CM | POA: Diagnosis not present

## 2014-03-02 DIAGNOSIS — R627 Adult failure to thrive: Secondary | ICD-10-CM | POA: Diagnosis present

## 2014-03-02 DIAGNOSIS — A419 Sepsis, unspecified organism: Secondary | ICD-10-CM | POA: Diagnosis present

## 2014-03-02 DIAGNOSIS — E1159 Type 2 diabetes mellitus with other circulatory complications: Secondary | ICD-10-CM | POA: Diagnosis not present

## 2014-03-02 DIAGNOSIS — Z87891 Personal history of nicotine dependence: Secondary | ICD-10-CM | POA: Diagnosis not present

## 2014-03-02 DIAGNOSIS — F0390 Unspecified dementia without behavioral disturbance: Secondary | ICD-10-CM | POA: Diagnosis present

## 2014-03-02 DIAGNOSIS — Z7401 Bed confinement status: Secondary | ICD-10-CM | POA: Diagnosis not present

## 2014-03-02 DIAGNOSIS — Z794 Long term (current) use of insulin: Secondary | ICD-10-CM | POA: Diagnosis not present

## 2014-03-02 DIAGNOSIS — E11319 Type 2 diabetes mellitus with unspecified diabetic retinopathy without macular edema: Secondary | ICD-10-CM | POA: Diagnosis present

## 2014-03-02 DIAGNOSIS — I129 Hypertensive chronic kidney disease with stage 1 through stage 4 chronic kidney disease, or unspecified chronic kidney disease: Secondary | ICD-10-CM | POA: Diagnosis present

## 2014-03-02 DIAGNOSIS — Z79899 Other long term (current) drug therapy: Secondary | ICD-10-CM | POA: Diagnosis not present

## 2014-03-02 DIAGNOSIS — N184 Chronic kidney disease, stage 4 (severe): Secondary | ICD-10-CM | POA: Diagnosis present

## 2014-03-02 DIAGNOSIS — I499 Cardiac arrhythmia, unspecified: Secondary | ICD-10-CM | POA: Diagnosis not present

## 2014-03-02 DIAGNOSIS — Z89421 Acquired absence of other right toe(s): Secondary | ICD-10-CM | POA: Diagnosis not present

## 2014-03-02 DIAGNOSIS — Z6837 Body mass index (BMI) 37.0-37.9, adult: Secondary | ICD-10-CM | POA: Diagnosis not present

## 2014-03-02 DIAGNOSIS — Z7982 Long term (current) use of aspirin: Secondary | ICD-10-CM | POA: Diagnosis not present

## 2014-03-02 HISTORY — DX: Chronic diastolic (congestive) heart failure: I50.32

## 2014-03-02 LAB — CBC
HCT: 30.2 % — ABNORMAL LOW (ref 36.0–46.0)
Hemoglobin: 9.6 g/dL — ABNORMAL LOW (ref 12.0–15.0)
MCH: 27.7 pg (ref 26.0–34.0)
MCHC: 31.8 g/dL (ref 30.0–36.0)
MCV: 87 fL (ref 78.0–100.0)
Platelets: 313 10*3/uL (ref 150–400)
RBC: 3.47 MIL/uL — ABNORMAL LOW (ref 3.87–5.11)
RDW: 15.6 % — ABNORMAL HIGH (ref 11.5–15.5)
WBC: 16.2 10*3/uL — ABNORMAL HIGH (ref 4.0–10.5)

## 2014-03-02 LAB — COMPREHENSIVE METABOLIC PANEL
ALT: 11 U/L (ref 0–35)
ANION GAP: 12 (ref 5–15)
AST: 17 U/L (ref 0–37)
Albumin: 2.5 g/dL — ABNORMAL LOW (ref 3.5–5.2)
Alkaline Phosphatase: 120 U/L — ABNORMAL HIGH (ref 39–117)
BUN: 65 mg/dL — ABNORMAL HIGH (ref 6–23)
CALCIUM: 8.8 mg/dL (ref 8.4–10.5)
CHLORIDE: 107 mmol/L (ref 96–112)
CO2: 22 mmol/L (ref 19–32)
CREATININE: 4.77 mg/dL — AB (ref 0.50–1.10)
GFR calc Af Amer: 10 mL/min — ABNORMAL LOW (ref >90)
GFR, EST NON AFRICAN AMERICAN: 8 mL/min — AB (ref >90)
GLUCOSE: 94 mg/dL (ref 70–99)
POTASSIUM: 5.4 mmol/L — AB (ref 3.5–5.1)
SODIUM: 141 mmol/L (ref 135–145)
TOTAL PROTEIN: 7.1 g/dL (ref 6.0–8.3)
Total Bilirubin: 0.7 mg/dL (ref 0.3–1.2)

## 2014-03-02 LAB — GLUCOSE, CAPILLARY
GLUCOSE-CAPILLARY: 106 mg/dL — AB (ref 70–99)
GLUCOSE-CAPILLARY: 146 mg/dL — AB (ref 70–99)
GLUCOSE-CAPILLARY: 37 mg/dL — AB (ref 70–99)
Glucose-Capillary: 129 mg/dL — ABNORMAL HIGH (ref 70–99)
Glucose-Capillary: 147 mg/dL — ABNORMAL HIGH (ref 70–99)
Glucose-Capillary: 149 mg/dL — ABNORMAL HIGH (ref 70–99)
Glucose-Capillary: 84 mg/dL (ref 70–99)
Glucose-Capillary: 85 mg/dL (ref 70–99)
Glucose-Capillary: 94 mg/dL (ref 70–99)

## 2014-03-02 LAB — URINE CULTURE: Colony Count: >=100000

## 2014-03-02 LAB — POTASSIUM: Potassium: 5 mmol/L (ref 3.5–5.1)

## 2014-03-02 LAB — MRSA PCR SCREENING: MRSA by PCR: NEGATIVE

## 2014-03-02 LAB — TSH: TSH: 1.2 u[IU]/mL (ref 0.350–4.500)

## 2014-03-02 MED ORDER — ASPIRIN EC 325 MG PO TBEC
325.0000 mg | DELAYED_RELEASE_TABLET | Freq: Every day | ORAL | Status: DC
  Administered 2014-03-04 – 2014-03-12 (×6): 325 mg via ORAL
  Filled 2014-03-02 (×12): qty 1

## 2014-03-02 MED ORDER — PIPERACILLIN-TAZOBACTAM IN DEX 2-0.25 GM/50ML IV SOLN
2.2500 g | Freq: Three times a day (TID) | INTRAVENOUS | Status: DC
  Administered 2014-03-02 – 2014-03-08 (×18): 2.25 g via INTRAVENOUS
  Filled 2014-03-02 (×23): qty 50

## 2014-03-02 MED ORDER — DEXTROSE 50 % IV SOLN
0.5000 | Freq: Once | INTRAVENOUS | Status: AC
  Administered 2014-03-02: 25 mL via INTRAVENOUS
  Filled 2014-03-02: qty 50

## 2014-03-02 MED ORDER — ASPIRIN 300 MG RE SUPP
300.0000 mg | Freq: Every day | RECTAL | Status: DC
  Administered 2014-03-02 – 2014-03-05 (×3): 300 mg via RECTAL
  Filled 2014-03-02 (×11): qty 1

## 2014-03-02 MED ORDER — DEXTROSE 50 % IV SOLN
INTRAVENOUS | Status: AC
  Administered 2014-03-02: 50 mL
  Filled 2014-03-02: qty 50

## 2014-03-02 MED ORDER — SODIUM CHLORIDE 0.9 % IV SOLN
1.0000 g | Freq: Once | INTRAVENOUS | Status: AC
  Administered 2014-03-02: 1 g via INTRAVENOUS
  Filled 2014-03-02: qty 10

## 2014-03-02 MED ORDER — INSULIN ASPART 100 UNIT/ML ~~LOC~~ SOLN
0.0000 [IU] | SUBCUTANEOUS | Status: DC
  Administered 2014-03-02 – 2014-03-03 (×3): 1 [IU] via SUBCUTANEOUS
  Administered 2014-03-03 (×2): 2 [IU] via SUBCUTANEOUS
  Administered 2014-03-03 (×3): 1 [IU] via SUBCUTANEOUS
  Administered 2014-03-04: 2 [IU] via SUBCUTANEOUS
  Administered 2014-03-04: 1 [IU] via SUBCUTANEOUS
  Administered 2014-03-04: 2 [IU] via SUBCUTANEOUS
  Administered 2014-03-04 – 2014-03-07 (×5): 1 [IU] via SUBCUTANEOUS
  Administered 2014-03-07: 2 [IU] via SUBCUTANEOUS
  Administered 2014-03-08 – 2014-03-11 (×4): 1 [IU] via SUBCUTANEOUS
  Administered 2014-03-11: 2 [IU] via SUBCUTANEOUS

## 2014-03-02 MED ORDER — INSULIN ASPART 100 UNIT/ML ~~LOC~~ SOLN
10.0000 [IU] | Freq: Once | SUBCUTANEOUS | Status: AC
  Administered 2014-03-02: 10 [IU] via INTRAVENOUS

## 2014-03-02 MED ORDER — DEXTROSE-NACL 5-0.45 % IV SOLN
INTRAVENOUS | Status: DC
  Administered 2014-03-02 – 2014-03-04 (×6): via INTRAVENOUS

## 2014-03-02 MED ORDER — DEXTROSE 50 % IV SOLN
INTRAVENOUS | Status: AC
  Administered 2014-03-02: 25 mL
  Filled 2014-03-02: qty 50

## 2014-03-02 NOTE — Progress Notes (Signed)
Patient ID: [name]  female  WUJ:[id number]    DOB: Apr 18, 1940    DOA: 03/01/2014  PCP: Hollace Kinnier, DO     Brief history of present illness  Patient is a 74 year old female with hypertension, legally blind, prior history of TIA/CVA, anemia, GERD, CKD, presented with altered mental status and hyperkalemia. Patient is a resident of skilled nursing facility and had not been herself but the staff at [facility]. She had not been eating or drinking very well, has had increased creatinine from her baseline. Patient was noted to have UTI. She has history of recurrent UTI.    Assessment/Plan: Principal Problem:   Acute encephalopathy likely worsened due to UTI, uremia, may have underlying history of dementia  - Due to prior history of TIA/CVA, will check CT head, for now continue aspirin PR/PO - NPO until alert and awake, SLP evaluation  - Continue IV Zosyn, follow urine cultures, blood cultures also ordered  Active Problems: UTI/ SIRS - Follow urine culture and sensitivities, continue IV Zosyn (per prior sensitivities)    acute renal failure, on chronic kidney disease, stage III, baseline creatinine 2-2.5, 4.9 at the time of admission  -  slightly improving today,   likely due to dehydration, placed on gentle hydration, monitor for any fluid overload, she has a history of chronic diastolic CHF  - Renal ultrasound ordered , may need renal consult if not improving    Type 2 diabetes, controlled, with peripheral circulatory disorder - Continue sliding scale insulin     Essential hypertension -BP currently borderline, hold the Coreg      prior history of  TIA (transient ischemic attack):  - Continue aspirin, unable to assess neurologically, patient notfollowing any commands, follow CT head       Hyperkalemia -  will give insulin/ D50, patient not taking any oral medications for Kayexalate, calcium gluconate     Chronic diastolic CHF (congestive heart failure) - Chest x-ray  shows pulmonary congestion, however does not appear to be volume overloaded at this time  - Monitor cautiously for any fluid overload, currently on IV fluids    DVT Prophylaxis: heparin subcutaneous   Code Status: full code  Family Communication:  Disposition:  Consultants: None  Procedures  none  Antibiotics:  IV zosyn  Subjective  Patient seen and examined, lethargic, opens eyes, Able to say her name but otherwise not following  commands. Failed a swallow evaluation in ED  Objective: Weight change:   Intake/Output Summary (Last 24 hours) at 03/02/14 0847 Last data filed at 03/01/14 2056  Gross per 24 hour  Intake      0 ml  Output    203 ml  Net   -203 ml   Blood pressure 94/60, pulse 71, temperature 98.6 F (37 C), temperature source Oral, resp. rate 18, weight 83.008 kg (183 lb), SpO2 100 %.  Physical Exam: General: lethargic however opens eyes to her name, oriented to self and place, does not follow commands, mumbling  CVS: S1-S2 clear, no murmur rubs or gallops Chest:Decreased breath sound at the bases Abdomen: soft nontender, nondistended, normal bowel sounds  Extremities:right foot in the heel protector Neuro: does not follow commands   Lab Results: Basic Metabolic Panel:  Recent Labs Lab 03/01/14 1728 03/02/14 0110 03/02/14 0645  NA 137  --  141  K 5.6* 5.0 5.4*  CL 104  --  107  CO2 20  --  22  GLUCOSE 102*  --  94  BUN  70*  --  65*  CREATININE 4.97*  --  4.77*  CALCIUM 9.0  --  8.8   Liver Function Tests:  Recent Labs Lab 03/02/14 0645  AST 17  ALT 11  ALKPHOS 120*  BILITOT 0.7  PROT 7.1  ALBUMIN 2.5*   No results for input(s): LIPASE, AMYLASE in the last 168 hours. No results for input(s): AMMONIA in the last 168 hours. CBC:  Recent Labs Lab 03/01/14 1728 03/02/14 0645  WBC 16.0* 16.2*  NEUTROABS 14.2*  --   HGB 10.1* 9.6*  HCT 30.9* 30.2*  MCV 85.4 87.0  PLT 311 313   Cardiac Enzymes: No results for input(s):  CKTOTAL, CKMB, CKMBINDEX, TROPONINI in the last 168 hours. BNP: Invalid input(s): POCBNP CBG:  Recent Labs Lab 03/01/14 2156 03/02/14 0009 03/02/14 0411 03/02/14 0741  GLUCAP 118* 106* 94 85     Micro Results: Recent Results (from the past 240 hour(s))  MRSA PCR Screening     Status: None   Collection Time: 03/01/14 11:55 PM  Result Value Ref Range Status   MRSA by PCR NEGATIVE NEGATIVE Final    Comment:        The GeneXpert MRSA Assay (FDA approved for NASAL specimens only), is one component of a comprehensive MRSA colonization surveillance program. It is not intended to diagnose MRSA infection nor to guide or monitor treatment for MRSA infections.     Studies/Results: Dg Chest 2 View  03/01/2014   CLINICAL DATA:  High potassium. History of diabetes and heart failure.  EXAM: CHEST  2 VIEW  COMPARISON:  01/01/2014  FINDINGS: Mild cardiomegaly and vascular congestion. Low lung volumes. No confluent opacities, effusions or edema. No acute bony abnormality.  IMPRESSION: Cardiomegaly, vascular congestion.   Electronically Signed   By: Rolm Baptise M.D.   On: 03/01/2014 19:12    Medications: Scheduled Meds: . antiseptic oral rinse  7 mL Mouth Rinse BID  . aspirin  300 mg Rectal Daily   Or  . aspirin EC  325 mg Oral Daily  . collagenase   Topical Daily  . feeding supplement (ENSURE)  1 Container Oral BID BM  . heparin  5,000 Units Subcutaneous 3 times per day  . insulin aspart  0-15 Units Subcutaneous 6 times per day  . piperacillin-tazobactam (ZOSYN)  IV  3.375 g Intravenous 3 times per day  . protein supplement  1 scoop Oral TID WC  . timolol  1 drop Left Eye BID    time spent 30 minutes     LOS: 1 day   Blonnie Maske M.D. [facility] 03/02/2014, 8:47 AM Pager: [phone]  If 7PM-7AM, please contact night-coverage [email or web address] Password [email or web address]

## 2014-03-02 NOTE — Evaluation (Signed)
Clinical/Bedside Swallow Evaluation Patient Details  Name: Tomie Spizzirri MRN: [id number] Date of Birth: 14-Aug-1940  Today's Date: 03/02/2014 Time: SLP Start Time (ACUTE ONLY): 7829 SLP Stop Time (ACUTE ONLY): 1413 SLP Time Calculation (min) (ACUTE ONLY): 8 min  Past Medical History:  Past Medical History  Diagnosis Date  . Hypertension   . Diabetes mellitus   . Blindness   . TIA (transient ischemic attack)   . Anemia   . GERD (gastroesophageal reflux disease)   . Constipation   . Renal disorder 05/2012    acute Renal Failure  . SYNCOPE 05/19/2009    Annotation: multiple over past 1 yr  Qualifier: Diagnosis of  By: [name] MD, [name]    . CHOLECYSTECTOMY, HX OF 05/19/2009    Qualifier: Diagnosis of  By: [name] MD, [name]     Past Surgical History:  Past Surgical History  Procedure Laterality Date  . Gallbladder surgery    . Esophageal dilation    . Cholecystectomy    . Amputation  02/18/2012    Procedure: AMPUTATION RAY;  Surgeon: [name], MD;  Location: [facility];  Service: Orthopedics;  Laterality: Right;  right second toe  . Tee without cardioversion  02/21/2012    Procedure: TRANSESOPHAGEAL ECHOCARDIOGRAM (TEE);  Surgeon: [name], MD;  [facility] END[facility];  Service: Cardiovascular;  Laterality: N/A;   spoke with [name] from [place] pt will arrive by 815ish( thy change shifts at [place])  . Tee without cardioversion  02/25/2012    Procedure: TRANSESOPHAGEAL ECHOCARDIOGRAM (TEE);  Surgeon: [name], MD;  Location: [place];  Service: Cardiovascular;  Laterality: N/A;  . Amputation Right 09/07/2013    Procedure: AMPUTATION RAY;  Surgeon: [name], MD;  Location: [place];  Service: Orthopedics;  Laterality: Right;  Right Transmetatarsal Amputation   HPI:  Patient is a 74 year old female with hypertension, legally blind, prior history of TIA/CVA, anemia, GERD, CKD, presented with altered mental status and hyperkalemia. Patient is a resident of skilled nursing  facility and has not been herself per the staff at [facility]. She had not been eating or drinking very well, has had increased creatinine from her baseline. Patient was noted to have UTI. Pt has a history of mild oropharygneal and esophgeal dysphagia, recommended to consume regular soldis and thin liquids on last assessment.    Assessment / Plan / Recommendation Clinical Impression  Pt unable to sustain arousal for safe PO intake at this time. Pt initially awoke and requested water, pursed lips to cup, but fell asleep as water touched lips despite cues for arousal. Recommend pt remain NPO; SLP will follow for readiness.     Aspiration Risk  Moderate    Diet Recommendation NPO   Medication Administration: Whole meds with puree (when alert)    Other  Recommendations Oral Care Recommendations: Oral care Q4 per protocol   Follow Up Recommendations       Frequency and Duration min 2x/week  1 week   Pertinent Vitals/Pain NA    SLP Swallow Goals     Swallow Study Prior Functional Status       General HPI: Patient is a 74 year old female with hypertension, legally blind, prior history of TIA/CVA, anemia, GERD, CKD, presented with altered mental status and hyperkalemia. Patient is a resident of skilled nursing facility and has not been herself per the staff at [facility]. She had not been eating or drinking very well, has had increased creatinine from her baseline. Patient was noted  to have UTI. Pt has a history of mild oropharygneal and esophgeal dysphagia, recommended to consume regular soldis and thin liquids on last assessment.  Type of Study: Bedside swallow evaluation Previous Swallow Assessment:  see HPI Diet Prior to this Study: NPO Temperature Spikes Noted: No Respiratory Status: Nasal cannula Behavior/Cognition: Lethargic Oral Cavity - Dentition: Dentures, top Self-Feeding Abilities: Total assist Patient Positioning: Upright in bed Baseline Vocal  Quality: Low vocal intensity;Hoarse Volitional Cough: Cognitively unable to elicit Volitional Swallow: Unable to elicit    Oral/Motor/Sensory Function Overall Oral Motor/Sensory Function: Other (comment) (unable to follow commands)   Ice Chips     Thin Liquid Thin Liquid: Impaired Presentation: Cup Oral Phase Impairments: Poor awareness of bolus;Reduced labial seal    Nectar Thick Nectar Thick Liquid: Not tested   Honey Thick Honey Thick Liquid: Not tested   Puree Puree: Not tested   Solid   GO    Solid: Not tested      Herbie Baltimore, MA CCC-SLP [phone]  Trae Bovenzi, Katherene Ponto 03/02/2014,2:18 PM

## 2014-03-02 NOTE — Clinical Social Work Psychosocial (Signed)
Clinical Social Work Department BRIEF PSYCHOSOCIAL ASSESSMENT 03/02/2014  Patient:  [name], [name]     Account Number:  [id number]     Admit date:  03/01/2014  Clinical Social Worker:  Gwenevere Abbot,  Date/Time:  03/02/2014 03:15 PM  Referred by:  Physician  Date Referred:    Other Referral:   Interview type:  Other - See comment Other interview type:    PSYCHOSOCIAL DATA Living Status:  FACILITY Admitted from facility:  [facility] [place] Level of care:  [place] Primary support name:  Sallye Lat Primary support relationship to patient:  NONE Degree of support available:   Sallye Lat, Social Worker  Legal [facility] Department of [place]  [phone]  After Hours/Weekends  [phone]    CURRENT CONCERNS  Other Concerns:    SOCIAL WORK ASSESSMENT / PLAN CSW-Intern observed CSW speaking with the legal guardian of [name] by phone; Andrea [phone]), advising of patient's admission to the hospital. [name] confirmed that [name] will be returning to [facility] at discharge. She requested to be contacted when patient is ready for discharge.   Assessment/plan status:  No Further Intervention Required Other assessment/ plan:   Information/referral to community resources:   None needed or requested at this time.    PATIENT'S/FAMILY'S RESPONSE TO PLAN OF CARE: AndreaAndrea [name] was pleasant and open speaking with CSW.

## 2014-03-02 NOTE — Progress Notes (Signed)
UR completed 

## 2014-03-02 NOTE — Progress Notes (Signed)
ANTIBIOTIC CONSULT NOTE - FOLLOW UP  Pharmacy Consult for Zosyn Indication: UTI  No Known Allergies  Patient Measurements: Height: 62 inches Weight: 183 lb (83.008 kg)  Vital Signs: Temp: 97.5 F (36.4 C) (02/02 1626) Temp Source: Oral (02/02 1626) BP: 123/51 mmHg (02/02 1626) Pulse Rate: 65 (02/02 1626)  Labs:  Recent Labs  03/01/14 1728 03/02/14 0645  WBC 16.0* 16.2*  HGB 10.1* 9.6*  PLT 311 313  CREATININE 4.97* 4.77*   Estimated Creatinine Clearance: 10.8 mL/min (by C-G formula based on Cr of 4.77).  Assessment:  Zosyn begun last night with 3.375 gm IV q8hrs (each over 4 hours).  hx UTI with ESBL Klebsiella 12/18/2013, only sensitive to Zosyn and Imipenem.  Afebrile, WBC still up at 16.2.  ARI, Scr 4.97>4.77. Baseline Scr 2.2.5.  Goal of Therapy:   appropriate Zosyn dose for renal function and infection  Plan:   Decrease Zosyn to 2.25 grams IV q8hrs.  Will follow renal function for any need to adjust.  Follow up culture data and sensitivities.  May need to change to Imipenem if ESBL again.  Arty Baumgartner, [place] Pager: [phone] 03/02/2014,4:38 PM

## 2014-03-02 NOTE — Progress Notes (Signed)
Hypoglycemic Event  CBG: 37  Treatment: D50 IV 50 mL  Symptoms: none   Follow-up CBG: Time:1510  CBG Result:129  Possible Reasons for Event: Inadequate meal intake and Other: NPOI, unable to swallow  Comments/MD notified:Dr. Cristopher Peru, Shawneen Deetz  Remember to initiate Hypoglycemia Order Set & complete

## 2014-03-02 NOTE — Progress Notes (Signed)
Patient ID: [name], female   DOB: July 03, 1940, 74 y.o.   MRN: [id number]  [facility]     No Known Allergies     Chief Complaint  Patient presents with  . Medical Management of Chronic Issues    HPI:  She is a long term resident of this facility being seen for the management of her chronic illnesses. Overall there is little change in her status. She is unable to fully participate in the hpi or ros. There are no nursing concerns being voiced at this time.    Past Medical History  Diagnosis Date  . Hypertension   . Diabetes mellitus   . Blindness   . TIA (transient ischemic attack)   . Anemia   . GERD (gastroesophageal reflux disease)   . Constipation   . Renal disorder 05/2012    acute Renal Failure  . SYNCOPE 05/19/2009    Annotation: multiple over past 1 yr  Qualifier: Diagnosis of  By: [name] MD, [name]    . CHOLECYSTECTOMY, HX OF 05/19/2009    Qualifier: Diagnosis of  By: [name] MD, [name]      Past Surgical History  Procedure Laterality Date  . Gallbladder surgery    . Esophageal dilation    . Cholecystectomy    . Amputation  02/18/2012    Procedure: AMPUTATION RAY;  Surgeon: [name], MD;  [facility];  Service: Orthopedics;  Laterality: Right;  right second toe  . Tee without cardioversion  02/21/2012    Procedure: TRANSESOPHAGEAL ECHOCARDIOGRAM (TEE);  Surgeon: [name], MD;  [facility] ENDOSCOPY;  Service: Cardiovascular;  Laterality: N/A;   spoke with [name] from [place] pt will arrive by 815ish( thy change shifts at [place])  . Tee without cardioversion  02/25/2012    Procedure: TRANSESOPHAGEAL ECHOCARDIOGRAM (TEE);  Surgeon: [name], MD;  Location: [place];  Service: Cardiovascular;  Laterality: N/A;  . Amputation Right 09/07/2013    Procedure: AMPUTATION RAY;  Surgeon: [name], MD;  Location: [place];  Service: Orthopedics;  Laterality: Right;  Right Transmetatarsal Amputation    VITAL SIGNS BP 128/65 mmHg  Pulse 70  Ht 5'  2" (1.575 m)  Wt 181 lb (82.101 kg)  BMI 33.10 kg/m2  SpO2 99%    MEDICATIONS  Medication Sig  . acetaminophen (TYLENOL) 325 MG tablet Take 650 mg by mouth every 6 (six) hours as needed for fever.   Marland Kitchen aspirin EC 325 MG tablet Take 325 mg by mouth daily.    . Brinzolamide-Brimonidine (SIMBRINZA) 1-0.2 % SUSP Place 1 drop into the left eye 2 (two) times daily.  . carvedilol (COREG) 3.125 MG tablet Take 3.125 mg by mouth 2 (two) times daily with a meal.  . collagenase (SANTYL) ointment Apply topically daily.  Marland Kitchen docusate sodium (COLACE) 100 MG capsule Take 100 mg by mouth 2 (two) times daily.  . DULoxetine (CYMBALTA) 60 MG capsule Take 1 capsule (60 mg total) by mouth daily. When completes 2 wks at 30mg  (Patient taking differently: Take 30 mg by mouth daily. On 01-23-14: being 60 mg daily)  . feeding supplement, ENSURE, (ENSURE) PUDG Take 1 Container by mouth 2 (two) times daily between meals.  . gabapentin (NEURONTIN) 100 MG capsule Take 3 capsules (300 mg total) by mouth at bedtime. Phantom pain  . HYDROcodone-acetaminophen (NORCO/VICODIN) 5-325 MG per tablet Take one tablet by mouth every 6 hours scheduled for pain  . hydrocortisone (ANUSOL-HC) 2.5 % rectal cream Place 1 application rectally 2 (two)  times daily as needed for hemorrhoids.   . insulin aspart (NOVOLOG) 100 UNIT/ML injection Inject 5 Units into the skin 3 (three) times daily with meals. 5 units prior to meals for cbg >=150)  . ipratropium-albuterol (DUONEB) 0.5-2.5 (3) MG/3ML SOLN Take 3 mLs by nebulization every 6 (six) hours as needed (wheezing).  . metoCLOPramide (REGLAN) 5 MG tablet Take 1 tablet (5 mg total) by mouth 4 (four) times daily -  before meals and at bedtime.  . Multiple Vitamins-Minerals (MULTIVITAMIN WITH MINERALS) tablet Take 1 tablet by mouth daily.  . polyethylene glycol (MIRALAX / GLYCOLAX) packet Take 17 g by mouth daily.  . promethazine (PHENERGAN) 25 MG tablet Take 25 mg by mouth every 6 (six) hours as  needed for nausea or vomiting.  . protein supplement (RESOURCE BENEPROTEIN) POWD Take 1 scoop by mouth 3 (three) times daily with meals.  . lipitor 10 mg  Take 10 mg by mouth daily.  . sodium chloride (OCEAN) 0.65 % SOLN nasal spray Place 1 spray into the nose 4 (four) times daily as needed for congestion.  . timolol (TIMOPTIC) 0.5 % ophthalmic solution Place 1 drop into the left eye 2 (two) times daily.     SIGNIFICANT DIAGNOSTIC EXAMS  12-20-13: mri right foot: 1. No findings of osteomyelitis or abscess, but the soft tissues laterally along the stump appear very thin overlying the distal fourth and fifth metatarsal amputation margins dorsally, possibly from ulceration or soft tissue breakdown.  12-26-13: chest x-ray: Cardiomegaly. Mild interstitial prominence bilaterally suspicious for mild interstitial edema. No segmental infiltrate  12-26-13: ct of abdomen and pelvis: 1. No acute abnormality. 2. Bilateral vascular renal calcifications and at least 1 nonobstructing right renal calculus.3. Small bilateral pleural effusions.4. Mild right lower lobe cylindrical bronchiectasis.5. Mild bibasilar atelectasis. 6. Dense coronary artery atheromatous calcifications.  12-27-13: 2-d echo: Mild LVH with LVEF 50-55%, inferolateral hypokinesis, grade 1 diastolic dysfunction with increased filling pressures. Mild left atrial enlargement. MAC with moderate mitral regurgitation. Mild tricuspid regurgitation with PASP 49 mmHg.  12-28-13: ct of head: No acute intracranial abnormality. Stable hydrocephalus.  Minimal change since 12/18/2013.  12-29-13: chest x-ray: Bronchitic changes with posterior density at the lung bases on lateral view question pleural effusions, cannot exclude RIGHT lower lobe consolidation.  12-29-13: renal ultrasound: No hydronephrosis or evidence of nephrolithiasis.  01-01-14: chest x-ray: Low lung volumes with increase in bibasilar atelectasis. Mild interstitial edema and stable  cardiomegaly.      LABS REVIEWED:   12-18-13: hgb a1c 6.2; tsh 0.858 12-27-13: wbc 9.5; hgb 10.1; hct 32.0; mcv 90.1; plt 275; glucose 169; bun 22; creat 0.97; k+3.9; na++142; liver normal albumin 2.3; BNP 4645 12-28-13: ammonia 26 12-30-13: glucose 120; bun 24; creat 2.1; k+ 4.0; na++142 01-05-14: glucose 79; bun 28; creat 2.46; k+4.2; na++ 140  01-11-14: glucose 147; bun 21.6; creat 1.52; k+4.8; na++140      Review of Systems  Unable to perform ROS    Physical Exam Constitutional: No distress.  Obese   Neck: Neck supple. No JVD present. No thyromegaly present.  Cardiovascular: Normal rate and regular rhythm.   Pedal pulses present status post right metatarsal amputation   Respiratory: Effort normal and breath sounds normal. No respiratory distress.  GI: Soft. Bowel sounds are normal. She exhibits no distension.  Genitourinary:  Has foley   Musculoskeletal: She exhibits no edema.  Is able to move all extremities   Neurological: She is alert.  Skin: Skin is warm and dry. She is not diaphoretic.  ASSESSMENT/ PLAN:  1. Diabetes: will continue novolog 5 units prior to meals for cbg >=150; and will monitor status her hgb a1c is 6.2   2.systolic and diastolic heart failure: Will continue coreg 3.125 mg twice daily and will monitor   3. peripheral neuropathy: will continue neurontin 300 mg nightly; will continue vicodin 5/325 mg every 6 hours for pain; will continue cymbalta 60 mg daily for pain as well as depression and will monitor her status.   4.dyslipidemia: will continue lipitor 10 mg daily   5. GERD: is stable will continue reglan 5 mg four times daily will monitor   6. Constipation: will continue colace twice daily; and miralax daily   7. TIA: is neurologically stable; will continue asa 325 mg daily   8. Glaucoma: will continue simbrinza to left eye twice daily and timoptic to left eye twice daily   9. Depression: she is without change will continue  cymbalta 60 mg daily will monitor   10. Urine retention: has foley long term will monitor has completed treatment for recent uti.  11. Right metatarsal amputation wound: will continue wound care as directed and will monitor       Ok Edwards NP [facility]  Contact [phone] Monday through Friday 8am- 5pm  After hours call [phone]

## 2014-03-03 LAB — GLUCOSE, CAPILLARY
GLUCOSE-CAPILLARY: 150 mg/dL — AB (ref 70–99)
Glucose-Capillary: 146 mg/dL — ABNORMAL HIGH (ref 70–99)
Glucose-Capillary: 153 mg/dL — ABNORMAL HIGH (ref 70–99)
Glucose-Capillary: 141 mg/dL — ABNORMAL HIGH (ref 70–99)
Glucose-Capillary: 144 mg/dL — ABNORMAL HIGH (ref 70–99)
Glucose-Capillary: 153 mg/dL — ABNORMAL HIGH (ref 70–99)

## 2014-03-03 LAB — BASIC METABOLIC PANEL
Anion gap: 11 (ref 5–15)
BUN: 63 mg/dL — AB (ref 6–23)
CO2: 25 mmol/L (ref 19–32)
Calcium: 8.6 mg/dL (ref 8.4–10.5)
Chloride: 103 mmol/L (ref 96–112)
Creatinine, Ser: 4.54 mg/dL — ABNORMAL HIGH (ref 0.50–1.10)
GFR calc Af Amer: 10 mL/min — ABNORMAL LOW (ref >90)
GFR calc non Af Amer: 9 mL/min — ABNORMAL LOW (ref >90)
Glucose, Bld: 162 mg/dL — ABNORMAL HIGH (ref 70–99)
Potassium: 4.6 mmol/L (ref 3.5–5.1)
SODIUM: 139 mmol/L (ref 135–145)

## 2014-03-03 LAB — CBC
HCT: 30 % — ABNORMAL LOW (ref 36.0–46.0)
HEMOGLOBIN: 9.9 g/dL — AB (ref 12.0–15.0)
MCH: 28 pg (ref 26.0–34.0)
MCHC: 33 g/dL (ref 30.0–36.0)
MCV: 85 fL (ref 78.0–100.0)
PLATELETS: 273 10*3/uL (ref 150–400)
RBC: 3.53 MIL/uL — ABNORMAL LOW (ref 3.87–5.11)
RDW: 15.7 % — ABNORMAL HIGH (ref 11.5–15.5)
WBC: 15.4 10*3/uL — ABNORMAL HIGH (ref 4.0–10.5)

## 2014-03-03 LAB — MAGNESIUM: MAGNESIUM: 1.9 mg/dL (ref 1.5–2.5)

## 2014-03-03 LAB — HEMOGLOBIN A1C
Hgb A1c MFr Bld: 6.3 % — ABNORMAL HIGH (ref 4.8–5.6)
Mean Plasma Glucose: 134 mg/dL

## 2014-03-03 MED ORDER — MORPHINE SULFATE 2 MG/ML IJ SOLN
2.0000 mg | Freq: Once | INTRAMUSCULAR | Status: AC
  Administered 2014-03-03: 2 mg via INTRAVENOUS
  Filled 2014-03-03: qty 1

## 2014-03-03 MED ORDER — SODIUM CHLORIDE 0.9 % IV SOLN: INTRAVENOUS | Status: DC

## 2014-03-03 NOTE — Progress Notes (Signed)
[facility] PROGRESS NOTE  Stephie Xu ZOX:[id number] DOB: [birth year] DOA: 03/01/2014 PCP: Gildardo Cranker, DO  Assessment/Plan: 1. Acute encephalopathy 1. Seems improving 2. Likely related to UTI with sepsis below 2. UTI with sepsis 1. Multiple organisms on urine culture 2. On empiric zosyn for now 3. Will repeat urine cx 4. Leukocytosis slowly improving. Afebrile 3. ARF 1. Cr slowly improving 2. Continue IVF as tolerated 4. DM2 1. Glucose stable 2. Cont on SSI coverage 5. HTN 1. bp stable and controlled 2. Cont current regimen 6. Hx TIA 1. stable 7. Hyperkalemia 1. Corrected yesterday 2. Follow lytes and correct as needed 8. Chronic diastolic CHF 1. Stable 2. No signs of volume overload 9. DVT prophylaxis 1. Heparin subQ  Code Status: Full Family Communication: Pt in room (indicate person spoken with, relationship, and if by phone, the number) Disposition Plan: Pending   Consultants:  None  Procedures:    Antibiotics:  Zosyn 2/2>>> (indicate start date, and stop date if known)  HPI/Subjective: Wants to eat. Feels thirsty  Objective: Filed Vitals:   03/02/14 1626 03/02/14 1900 03/03/14 0429 03/03/14 1019  BP: 123/51 157/109 145/71 122/51  Pulse: 65 70 91 92  Temp: 97.5 F (36.4 C) 97.4 F (36.3 C) 97.5 F (36.4 C) 98.1 F (36.7 C)  TempSrc: Oral Axillary Oral Oral  Resp: 16 18 18 16   Weight:  83.689 kg (184 lb 8 oz)    SpO2: 100% 100% 98% 100%    Intake/Output Summary (Last 24 hours) at 03/03/14 1643 Last data filed at 03/03/14 1538  Gross per 24 hour  Intake      0 ml  Output    702 ml  Net   -702 ml   [facility] Weights   03/01/14 2139 03/02/14 1900  Weight: 83.008 kg (183 lb) 83.689 kg (184 lb 8 oz)    Exam:   General:  Asleep, arousable, in nad  Cardiovascular: regular,s 1, s2  Respiratory: normal resp effort, no wheezing  Abdomen: soft, nondistended  Musculoskeletal: perfused, no clubbing   Data Reviewed: Basic  Metabolic Panel:  Recent Labs Lab 03/01/14 1728 03/02/14 0110 03/02/14 0645 03/03/14 0630  NA 137  --  141 139  K 5.6* 5.0 5.4* 4.6  CL 104  --  107 103  CO2 20  --  22 25  GLUCOSE 102*  --  94 162*  BUN 70*  --  65* 63*  CREATININE 4.97*  --  4.77* 4.54*  CALCIUM 9.0  --  8.8 8.6   Liver Function Tests:  Recent Labs Lab 03/02/14 0645  AST 17  ALT 11  ALKPHOS 120*  BILITOT 0.7  PROT 7.1  ALBUMIN 2.5*   No results for input(s): LIPASE, AMYLASE in the last 168 hours. No results for input(s): AMMONIA in the last 168 hours. CBC:  Recent Labs Lab 03/01/14 1728 03/02/14 0645 03/03/14 0630  WBC 16.0* 16.2* 15.4*  NEUTROABS 14.2*  --   --   HGB 10.1* 9.6* 9.9*  HCT 30.9* 30.2* 30.0*  MCV 85.4 87.0 85.0  PLT 311 313 273   Cardiac Enzymes: No results for input(s): CKTOTAL, CKMB, CKMBINDEX, TROPONINI in the last 168 hours. BNP (last 3 results) No results for input(s): BNP in the last 8760 hours.  ProBNP (last 3 results)  Recent Labs  12/26/13 1640 12/27/13 0040  PROBNP 5360.0* 4645.0*    CBG:  Recent Labs Lab 03/02/14 2032 03/02/14 2353 03/03/14 0428 03/03/14 0753 03/03/14 1119  GLUCAP 149* 147* 146* 153*  141*    Recent Results (from the past 240 hour(s))  Urine culture     Status: None   Collection Time: 03/01/14  6:10 PM  Result Value Ref Range Status   Specimen Description URINE, CATHETERIZED  Final   Special Requests NONE  Final   Colony Count   Final    >=100,000 COLONIES/ML Performed at [facility]    Culture   Final    Multiple bacterial morphotypes present, none predominant. Suggest appropriate recollection if clinically indicated. Performed at [facility]    Report Status 03/02/2014 FINAL  Final  MRSA PCR Screening     Status: None   Collection Time: 03/01/14 11:55 PM  Result Value Ref Range Status   MRSA by PCR NEGATIVE NEGATIVE Final    Comment:        The GeneXpert MRSA Assay (FDA approved for NASAL  specimens only), is one component of a comprehensive MRSA colonization surveillance program. It is not intended to diagnose MRSA infection nor to guide or monitor treatment for MRSA infections.   Culture, blood (routine x 2)     Status: None (Preliminary result)   Collection Time: 03/02/14  9:45 AM  Result Value Ref Range Status   Specimen Description BLOOD RIGHT HAND  Final   Special Requests BOTTLES DRAWN AEROBIC AND ANAEROBIC 10CC  Final   Culture   Final           BLOOD CULTURE RECEIVED NO GROWTH TO DATE CULTURE WILL BE HELD FOR 5 DAYS BEFORE ISSUING A FINAL NEGATIVE REPORT Performed at [facility]    Report Status PENDING  Incomplete  Culture, blood (routine x 2)     Status: None (Preliminary result)   Collection Time: 03/02/14  9:58 AM  Result Value Ref Range Status   Specimen Description BLOOD LEFT HAND  Final   Special Requests BOTTLES DRAWN AEROBIC AND ANAEROBIC 10CC  Final   Culture   Final           BLOOD CULTURE RECEIVED NO GROWTH TO DATE CULTURE WILL BE HELD FOR 5 DAYS BEFORE ISSUING A FINAL NEGATIVE REPORT Performed at [facility]    Report Status PENDING  Incomplete     Studies: Dg Chest 2 View  03/01/2014   CLINICAL DATA:  High potassium. History of diabetes and heart failure.  EXAM: CHEST  2 VIEW  COMPARISON:  01/01/2014  FINDINGS: Mild cardiomegaly and vascular congestion. Low lung volumes. No confluent opacities, effusions or edema. No acute bony abnormality.  IMPRESSION: Cardiomegaly, vascular congestion.   Electronically Signed   By: Rolm Baptise M.D.   On: 03/01/2014 19:12   Ct Head Wo Contrast  03/02/2014   CLINICAL DATA:  Acute encephalopathy with increased lethargy  EXAM: CT HEAD WITHOUT CONTRAST  TECHNIQUE: Contiguous axial images were obtained from the base of the skull through the vertex without intravenous contrast.  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 The overall size and  contour of the ventricles remain stable compared to prior study. The sulci are not appreciably enlarged. There is no intracranial mass, hemorrhage, extra-axial fluid collection, or midline shift. There is evidence of a prior infarct in the superior anterior right frontal lobe. There is patchy small vessel disease in the centra semiovale bilaterally, stable. The bony calvarium appears intact. There is minimal opacification of posterior mastoid air cells inferiorly on the right. Most of the mastoids on the right are clear. There is  diffuse opacification of left mastoid air cells. Extensive calcification is noted in the right globe, stable. Calcification along the retina on the left is stable.  IMPRESSION: Generalized hydrocephalus remains stable without focal site of obstruction. Sulci normal in size. 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 No acute hemorrhage, mass, or edema. No acute appearing infarct. Diffuse opacification left mastoid air cells, stable. Minimal opacification of several right-sided mastoid air cell cells, stable. Calcification in each globe, more severe on the right than on the left, again noted and stable.   Electronically Signed   By: Lowella Grip M.D.   On: 03/02/2014 10:36   US Renal  03/02/2014   CLINICAL DATA:  Acute renal failure.  EXAM: RENAL/URINARY TRACT ULTRASOUND COMPLETE  COMPARISON:  12/29/2013.  FINDINGS: Right Kidney:  Length: 10.8 cm. Echogenicity within normal limits. No mass or hydronephrosis visualized.  Left Kidney:  Left visualized due to overlying bowel gas.  Bladder:  No bladder distention.  IMPRESSION: 1. Right kidney appears unremarkable.  Bladder is nondistended. 2. Left kidney not visualized due to prominent overlying bowel gas.   Electronically Signed   By: Marcello Moores  Register   On: 03/02/2014 12:36    Scheduled Meds: . antiseptic oral rinse  7 mL Mouth Rinse BID  . aspirin  300 mg Rectal Daily   Or  . aspirin EC  325  mg Oral Daily  . collagenase   Topical Daily  . feeding supplement (ENSURE)  1 Container Oral BID BM  . heparin  5,000 Units Subcutaneous 3 times per day  . insulin aspart  0-9 Units Subcutaneous 6 times per day  . piperacillin-tazobactam (ZOSYN)  IV  2.25 g Intravenous 3 times per day  . protein supplement  1 scoop Oral TID WC  . timolol  1 drop Left Eye BID   Continuous Infusions: . dextrose 5 % and 0.45% NaCl 100 mL/hr at 03/03/14 1200    Principal Problem:   Acute encephalopathy Active Problems:   Type 2 diabetes, controlled, with peripheral circulatory disorder   Essential hypertension   TIA (transient ischemic attack)   UTI (lower urinary tract infection)   Acute on chronic renal failure   Hyperkalemia   Chronic diastolic CHF (congestive heart failure)   [facility], [facility] Pager [phone]. If 7PM-7AM, please contact night-coverage at [email or web address], password [facility] 03/03/2014, 4:43 PM  LOS: 2 days

## 2014-03-03 NOTE — Progress Notes (Signed)
Speech Language Pathology Treatment: Dysphagia  Patient Details Name: [name] MRN: [id number] DOB: 06-Apr-1940 Today's Date: 03/03/2014 Time: 0254-2706 SLP Time Calculation (min) (ACUTE ONLY): 18 min  Assessment / Plan / Recommendation Clinical Impression  Pt with increased alertness today as compared to initial SLP evaluation on previous date, although still requiring Min cues to maintain alert state. Pt without overt signs of aspiration across consistencies, although requiring Min verbal and tactile cues for lingual sweep to clear left buccal pocketing with regular textures. Recommend initiation of Dys 3 diet and thin liquids with SLP f/u for tolerance. Suspect patient will likely advance well as alertness and mentation continues to improve.   HPI HPI: Patient is a 74 year old female with hypertension, legally blind, prior history of TIA/CVA, anemia, GERD, CKD, presented with altered mental status and hyperkalemia. Patient is a resident of skilled nursing facility and has not been herself per the staff at [facility]. She had not been eating or drinking very well, has had increased creatinine from her baseline. Patient was noted to have UTI. Pt has a history of mild oropharygneal and esophgeal dysphagia, recommended to consume regular soldis and thin liquids on last assessment.    Pertinent Vitals Pain Assessment: No/denies pain  SLP Plan  Goals updated    Recommendations Diet recommendations: Dysphagia 3 (mechanical soft);Thin liquid Liquids provided via: Cup;Straw Medication Administration: Whole meds with liquid Supervision: Staff to assist with self feeding;Full supervision/cueing for compensatory strategies Compensations: Slow rate;Small sips/bites;Check for pocketing (must be alert) Postural Changes and/or Swallow Maneuvers: Seated upright 90 degrees;Upright 30-60 min after meal       Oral Care Recommendations: Oral care BID Follow up Recommendations:  (tba - none  anticipated) Plan: Goals updated    GO     Germain Osgood, M.A. CCC-SLP [phone]  Germain Osgood 03/03/2014, 10:51 AM

## 2014-03-04 LAB — BASIC METABOLIC PANEL
Anion gap: 14 (ref 5–15)
BUN: 61 mg/dL — AB (ref 6–23)
CALCIUM: 8.1 mg/dL — AB (ref 8.4–10.5)
CO2: 19 mmol/L (ref 19–32)
CREATININE: 4.24 mg/dL — AB (ref 0.50–1.10)
Chloride: 103 mmol/L (ref 96–112)
GFR calc non Af Amer: 10 mL/min — ABNORMAL LOW (ref >90)
GFR, EST AFRICAN AMERICAN: 11 mL/min — AB (ref >90)
Glucose, Bld: 159 mg/dL — ABNORMAL HIGH (ref 70–99)
Potassium: 4 mmol/L (ref 3.5–5.1)
Sodium: 136 mmol/L (ref 135–145)

## 2014-03-04 LAB — GLUCOSE, CAPILLARY
GLUCOSE-CAPILLARY: 147 mg/dL — AB (ref 70–99)
Glucose-Capillary: 145 mg/dL — ABNORMAL HIGH (ref 70–99)
Glucose-Capillary: 143 mg/dL — ABNORMAL HIGH (ref 70–99)
Glucose-Capillary: 146 mg/dL — ABNORMAL HIGH (ref 70–99)
Glucose-Capillary: 167 mg/dL — ABNORMAL HIGH (ref 70–99)

## 2014-03-04 LAB — URINE CULTURE
Colony Count: NO GROWTH
Culture: NO GROWTH

## 2014-03-04 NOTE — Progress Notes (Signed)
Speech Language Pathology Treatment: Dysphagia  Patient Details Name: [name] MRN: [id number] DOB: 08-21-1940 Today's Date: 03/04/2014 Time: 0092-3300 SLP Time Calculation (min) (ACUTE ONLY): 12 min  Assessment / Plan / Recommendation Clinical Impression  Goal of session was to determine pt tolerance of dys 3/ thn liquid diet and possible upgrade. PO trials with thin liquids and solids did not elicit any s/s of aspiration. Left oral pocketing was not observed during this session and per RN report, this was not observed during prior meal. Provided pt will minimal verbal cues during end of session to check for oral pocketing. Recommend pt upgrade to regular/ thin liquid diet. Pt needs to be upright in bed during meals. Speech will sign off.    HPI HPI: Patient is a 74 year old female with hypertension, legally blind, prior history of TIA/CVA, anemia, GERD, CKD, presented with altered mental status and hyperkalemia. Patient is a resident of skilled nursing facility and has not been herself per the staff at [facility]. She had not been eating or drinking very well, has had increased creatinine from her baseline. Patient was noted to have UTI. Pt has a history of mild oropharygneal and esophgeal dysphagia, recommended to consume regular soldis and thin liquids on last assessment.    Pertinent Vitals    SLP Plan  Continue with current plan of care    Recommendations Diet recommendations: Regular;Thin liquid Liquids provided via: Cup;Straw Medication Administration: Whole meds with liquid Supervision: Staff to assist with self feeding Compensations: Check for pocketing;Small sips/bites;Slow rate Postural Changes and/or Swallow Maneuvers: Seated upright 90 degrees              Oral Care Recommendations: Oral care BID Plan: Continue with current plan of care    GO     Bermudez-Bosch, Nello Corro 03/04/2014, 8:57 AM

## 2014-03-04 NOTE — Progress Notes (Signed)
[facility] PROGRESS NOTE  [name] GQQ:[id number] DOB: January 05, 1941 DOA: 03/01/2014 PCP: Gildardo Cranker, DO  Assessment/Plan: 1. Acute encephalopathy 1. Seems improving. Pt appears more awake 2. Likely related to UTI with sepsis below 2. UTI with sepsis 1. Multiple organisms on urine culture 2. Remains on empiric zosyn 3. Repeat urine cx pending 4. Leukocytosis were slowly improving. Afebrile 3. ARF 1. Cr is slowly improving 2. Continue IVF as tolerated 3. Follow renal function. If no significant change, would consider formal Nephrology consultation 4. DM2 1. Glucose stable 2. Cont on SSI coverage 5. HTN 1. bp stable and controlled 2. Cont current regimen 6. Hx TIA 1. stable 7. Hyperkalemia 1. Corrected yesterday 2. Follow lytes and correct as needed 8. Chronic diastolic CHF 1. Stable 2. No signs of volume overload 9. DVT prophylaxis 1. Heparin subQ  Code Status: Full Family Communication: Pt in room Disposition Plan: Pending   Consultants:  None  Procedures:    Antibiotics:  Zosyn 2/2>>> (indicate start date, and stop date if known)  HPI/Subjective: More awake. No complaints.   Objective: Filed Vitals:   03/03/14 2021 03/04/14 0442 03/04/14 0809 03/04/14 1701  BP: 140/60 153/63 139/60 154/70  Pulse: 70 88 70 65  Temp: 98 F (36.7 C) 97.6 F (36.4 C) 98.1 F (36.7 C) 97.7 F (36.5 C)  TempSrc: Oral Oral Oral Oral  Resp: 18 18 18 16   Weight: 83.5 kg (184 lb 1.4 oz)     SpO2: 96% 96% 97% 98%    Intake/Output Summary (Last 24 hours) at 03/04/14 1824 Last data filed at 03/04/14 1320  Gross per 24 hour  Intake    360 ml  Output      0 ml  Net    360 ml   Filed Weights   03/01/14 2139 03/02/14 1900 03/03/14 2021  Weight: 83.008 kg (183 lb) 83.689 kg (184 lb 8 oz) 83.5 kg (184 lb 1.4 oz)    Exam:   General:  Awake in bed, in nad  Cardiovascular: regular,s 1, s2  Respiratory: normal resp effort, no wheezing  Abdomen: soft,  nondistended  Musculoskeletal: perfused, no clubbing   Data Reviewed: Basic Metabolic Panel:  Recent Labs Lab 03/01/14 1728 03/02/14 0110 03/02/14 0645 03/03/14 0630 03/03/14 2003 03/04/14 0700  NA 137  --  141 139  --  136  K 5.6* 5.0 5.4* 4.6  --  4.0  CL 104  --  107 103  --  103  CO2 20  --  22 25  --  19  GLUCOSE 102*  --  94 162*  --  159*  BUN 70*  --  65* 63*  --  61*  CREATININE 4.97*  --  4.77* 4.54*  --  4.24*  CALCIUM 9.0  --  8.8 8.6  --  8.1*  MG  --   --   --   --  1.9  --    Liver Function Tests:  Recent Labs Lab 03/02/14 0645  AST 17  ALT 11  ALKPHOS 120*  BILITOT 0.7  PROT 7.1  ALBUMIN 2.5*   No results for input(s): LIPASE, AMYLASE in the last 168 hours. No results for input(s): AMMONIA in the last 168 hours. CBC:  Recent Labs Lab 03/01/14 1728 03/02/14 0645 03/03/14 0630  WBC 16.0* 16.2* 15.4*  NEUTROABS 14.2*  --   --   HGB 10.1* 9.6* 9.9*  HCT 30.9* 30.2* 30.0*  MCV 85.4 87.0 85.0  PLT 311 313 273  Cardiac Enzymes: No results for input(s): CKTOTAL, CKMB, CKMBINDEX, TROPONINI in the last 168 hours. BNP (last 3 results) No results for input(s): BNP in the last 8760 hours.  ProBNP (last 3 results)  Recent Labs  12/26/13 1640 12/27/13 0040  PROBNP 5360.0* 4645.0*    CBG:  Recent Labs Lab 03/03/14 2359 03/04/14 0435 03/04/14 0804 03/04/14 1104 03/04/14 1642  GLUCAP 150* 145* 143* 146* 167*    Recent Results (from the past 240 hour(s))  Urine culture     Status: None   Collection Time: 03/01/14  6:10 PM  Result Value Ref Range Status   Specimen Description URINE, CATHETERIZED  Final   Special Requests NONE  Final   Colony Count   Final    >=100,000 COLONIES/ML Performed at [facility]    Culture   Final    Multiple bacterial morphotypes present, none predominant. Suggest appropriate recollection if clinically indicated. Performed at [facility]    Report Status 03/02/2014 FINAL  Final   MRSA PCR Screening     Status: None   Collection Time: 03/01/14 11:55 PM  Result Value Ref Range Status   MRSA by PCR NEGATIVE NEGATIVE Final    Comment:        The GeneXpert MRSA Assay (FDA approved for NASAL specimens only), is one component of a comprehensive MRSA colonization surveillance program. It is not intended to diagnose MRSA infection nor to guide or monitor treatment for MRSA infections.   Culture, blood (routine x 2)     Status: None (Preliminary result)   Collection Time: 03/02/14  9:45 AM  Result Value Ref Range Status   Specimen Description BLOOD RIGHT HAND  Final   Special Requests BOTTLES DRAWN AEROBIC AND ANAEROBIC 10CC  Final   Culture   Final           BLOOD CULTURE RECEIVED NO GROWTH TO DATE CULTURE WILL BE HELD FOR 5 DAYS BEFORE ISSUING A FINAL NEGATIVE REPORT Performed at [facility]    Report Status PENDING  Incomplete  Culture, blood (routine x 2)     Status: None (Preliminary result)   Collection Time: 03/02/14  9:58 AM  Result Value Ref Range Status   Specimen Description BLOOD LEFT HAND  Final   Special Requests BOTTLES DRAWN AEROBIC AND ANAEROBIC 10CC  Final   Culture   Final           BLOOD CULTURE RECEIVED NO GROWTH TO DATE CULTURE WILL BE HELD FOR 5 DAYS BEFORE ISSUING A FINAL NEGATIVE REPORT Performed at [facility]    Report Status PENDING  Incomplete     Studies: No results found.  Scheduled Meds: . antiseptic oral rinse  7 mL Mouth Rinse BID  . aspirin  300 mg Rectal Daily   Or  . aspirin EC  325 mg Oral Daily  . collagenase   Topical Daily  . feeding supplement (ENSURE)  1 Container Oral BID BM  . heparin  5,000 Units Subcutaneous 3 times per day  . insulin aspart  0-9 Units Subcutaneous 6 times per day  . piperacillin-tazobactam (ZOSYN)  IV  2.25 g Intravenous 3 times per day  . protein supplement  1 scoop Oral TID WC  . timolol  1 drop Left Eye BID   Continuous Infusions: . dextrose 5 % and 0.45% NaCl  100 mL/hr at 03/04/14 1007    Principal Problem:   Acute encephalopathy Active Problems:   Type 2 diabetes, controlled, with peripheral  circulatory disorder   Essential hypertension   TIA (transient ischemic attack)   UTI (lower urinary tract infection)   Acute on chronic renal failure   Hyperkalemia   Chronic diastolic CHF (congestive heart failure)   [name], [facility] Pager [phone]. If 7PM-7AM, please contact night-coverage at [email or web address], password [facility] 03/04/2014, 6:24 PM  LOS: 3 days

## 2014-03-05 ENCOUNTER — Inpatient Hospital Stay (HOSPITAL_COMMUNITY): Payer: Medicare Other

## 2014-03-05 LAB — CBC WITH DIFFERENTIAL/PLATELET
BASOS ABS: 0 10*3/uL (ref 0.0–0.1)
BASOS PCT: 0 % (ref 0–1)
Eosinophils Absolute: 0.8 10*3/uL — ABNORMAL HIGH (ref 0.0–0.7)
Eosinophils Relative: 6 % — ABNORMAL HIGH (ref 0–5)
HCT: 28 % — ABNORMAL LOW (ref 36.0–46.0)
Hemoglobin: 8.9 g/dL — ABNORMAL LOW (ref 12.0–15.0)
LYMPHS ABS: 2.1 10*3/uL (ref 0.7–4.0)
Lymphocytes Relative: 15 % (ref 12–46)
MCH: 27.1 pg (ref 26.0–34.0)
MCHC: 31.8 g/dL (ref 30.0–36.0)
MCV: 85.1 fL (ref 78.0–100.0)
Monocytes Absolute: 0.9 10*3/uL (ref 0.1–1.0)
Monocytes Relative: 7 % (ref 3–12)
NEUTROS ABS: 9.7 10*3/uL — AB (ref 1.7–7.7)
Neutrophils Relative %: 72 % (ref 43–77)
PLATELETS: 273 10*3/uL (ref 150–400)
RBC: 3.29 MIL/uL — AB (ref 3.87–5.11)
RDW: 15.5 % (ref 11.5–15.5)
WBC: 13.4 10*3/uL — ABNORMAL HIGH (ref 4.0–10.5)

## 2014-03-05 LAB — BASIC METABOLIC PANEL
ANION GAP: 11 (ref 5–15)
BUN: 55 mg/dL — ABNORMAL HIGH (ref 6–23)
CALCIUM: 8 mg/dL — AB (ref 8.4–10.5)
CHLORIDE: 103 mmol/L (ref 96–112)
CO2: 21 mmol/L (ref 19–32)
Creatinine, Ser: 4.04 mg/dL — ABNORMAL HIGH (ref 0.50–1.10)
GFR calc Af Amer: 12 mL/min — ABNORMAL LOW (ref >90)
GFR calc non Af Amer: 10 mL/min — ABNORMAL LOW (ref >90)
GLUCOSE: 114 mg/dL — AB (ref 70–99)
Potassium: 4 mmol/L (ref 3.5–5.1)
Sodium: 135 mmol/L (ref 135–145)

## 2014-03-05 LAB — TROPONIN I
Troponin I: 0.04 ng/mL — ABNORMAL HIGH (ref <0.031)
Troponin I: 0.07 ng/mL — ABNORMAL HIGH (ref <0.031)

## 2014-03-05 LAB — GLUCOSE, CAPILLARY
GLUCOSE-CAPILLARY: 103 mg/dL — AB (ref 70–99)
GLUCOSE-CAPILLARY: 114 mg/dL — AB (ref 70–99)
Glucose-Capillary: 97 mg/dL (ref 70–99)
Glucose-Capillary: 103 mg/dL — ABNORMAL HIGH (ref 70–99)
Glucose-Capillary: 110 mg/dL — ABNORMAL HIGH (ref 70–99)
Glucose-Capillary: 96 mg/dL (ref 70–99)
Glucose-Capillary: 90 mg/dL (ref 70–99)

## 2014-03-05 MED ORDER — GI COCKTAIL ~~LOC~~
30.0000 mL | Freq: Once | ORAL | Status: AC
  Administered 2014-03-06: 30 mL via ORAL
  Filled 2014-03-05: qty 30

## 2014-03-05 MED ORDER — METOPROLOL TARTRATE 12.5 MG HALF TABLET
12.5000 mg | ORAL_TABLET | Freq: Two times a day (BID) | ORAL | Status: DC
  Administered 2014-03-05 – 2014-03-12 (×12): 12.5 mg via ORAL
  Filled 2014-03-05 (×17): qty 1

## 2014-03-05 NOTE — Progress Notes (Signed)
CCMD notified RN of pt having a 14 beat run of V-Tach. Pt asymptomatic.  Dr. Wyline Copas notified. MD gave telephone order for stat EKG. EKG has been obtained. Will continue to monitor patient.

## 2014-03-05 NOTE — Care Management Note (Signed)
CARE MANAGEMENT NOTE 03/05/2014  Patient:  KARMA, ANSLEY   Account Number:  [id number]  Date Initiated:  03/05/2014  Documentation initiated by:  Johnathen Testa  Subjective/Objective Assessment:   CM following for progression and d/c planning. This pt is a resident of [place], where she is "total Care" . This pt is not ambulatory or mobil.     Action/Plan:   following for d/c needs.   Anticipated DC Date:  03/07/2014   Anticipated DC Plan:  SKILLED NURSING FACILITY         Choice offered to / List presented to:             Status of service:  In process, will continue to follow Medicare Important Message given?  YES (If response is "NO", the following Medicare IM given date fields will be blank) Date Medicare IM given:  03/05/2014 Medicare IM given by:  Laportia Carley Date Additional Medicare IM given:   Additional Medicare IM given by:    Discharge Disposition:    Per UR Regulation:    If discussed at Long Length of Stay Meetings, dates discussed:    Comments:

## 2014-03-05 NOTE — Progress Notes (Signed)
ANTIBIOTIC CONSULT NOTE - FOLLOW UP  Pharmacy Consult for Zosyn Indication: UTI  No Known Allergies  Patient Measurements: Height: 62 inches Weight: 195 lb 14.4 oz (88.86 kg)  Vital Signs: Temp: 98.7 F (37.1 C) (02/05 0922) Temp Source: Axillary (02/05 0922) BP: 120/53 mmHg (02/05 0922) Pulse Rate: 61 (02/05 0922)  Labs:  Recent Labs  03/03/14 0630 03/04/14 0700 03/05/14 0508  WBC 15.4*  --  13.4*  HGB 9.9*  --  8.9*  PLT 273  --  273  CREATININE 4.54* 4.24* 4.04*   Estimated Creatinine Clearance: 13.2 mL/min (by C-G formula based on Cr of 4.04).  Assessment:  Zosyn day # 5. Dose remains appropriate for renal function.  hx UTI with ESBL Klebsiella 12/18/2013, only sensitive to Zosyn and Imipenem.    Afebrile, WBC down to 13.4. 2/1 urine culture with multiple species. 2/3 urine culture negative. Blood cultures negative to date.  ARI, Scr 4.97>>4.04. Baseline Scr 2.2.5.  Goal of Therapy:   appropriate Zosyn dose for renal function and infection  Plan:   Continue Zosyn to 2.25 grams IV q8hrs.  Will follow renal function for any need to adjust.  Will follow up for length of therapy.   Arty Baumgartner, [place] Pager: [phone] 03/05/2014,1:24 PM

## 2014-03-05 NOTE — Clinical Social Work Note (Signed)
CSW continuing to monitor patient's progress and will assist with discharge back to [facility] skilled nursing facility when medically stable.  Jassiah Viviano Givens, MSW, LCSW Licensed Clinical Social Worker [place] [phone]

## 2014-03-05 NOTE — Progress Notes (Addendum)
[facility] PROGRESS NOTE  Camarie Mctigue MEQ:[id number] DOB: [birth year] DOA: 03/01/2014 PCP: Gildardo Cranker, DO  Assessment/Plan: 1. Acute encephalopathy 1. Asleep this AM, arousable  2. Likely related to UTI with sepsis with acute renal failure below 2. UTI with sepsis 1. Multiple organisms on urine culture 2. Remains on empiric zosyn 3. Repeat urine cx without growth 4. Leukocytosis improving. Afebrile 3. ARF 1. Cr is slowly improving 2. Continue IVF as tolerated 3. Follow renal function. If no significant change, would consider formal Nephrology consultation 4. DM2 1. Glucose stable 2. Cont on SSI coverage 5. HTN 1. bp stable and controlled 2. Cont current regimen 6. Hx TIA 1. stable 7. Hyperkalemia 1. Corrected yesterday 2. Follow lytes and correct as needed 8. Chronic diastolic CHF 1. Stable 2. No signs of volume overload 9. DVT prophylaxis 1. Heparin subQ 10. Wide complex arrhythmia 1. Discussed and reviewed rhythm strip with Cardiology 2. Possible brief period of afib noted followed by short burst of vtach 3. Follow up EKG with sinus rhythm 4. Lytes this AM unremarkable 5. Discussed with Cardiology with recommendation for low dose beta blocker and continue monitoring 6. Will order  Code Status: Full Family Communication: Pt in room Disposition Plan: Pending   Consultants:  None  Procedures:    Antibiotics:  Zosyn 2/2>>> (indicate start date, and stop date if known)  HPI/Subjective: More awake. No complaints.   Objective: Filed Vitals:   03/04/14 1958 03/05/14 0434 03/05/14 0922 03/05/14 1323  BP: 139/48 134/64 120/53 138/57  Pulse: 65 77 61 61  Temp: 97.4 F (36.3 C) 97.6 F (36.4 C) 98.7 F (37.1 C)   TempSrc: Oral Axillary Axillary   Resp: 16 16 16 18   Weight: 88.86 kg (195 lb 14.4 oz)     SpO2: 90% 94% 94% 94%    Intake/Output Summary (Last 24 hours) at 03/05/14 1811 Last data filed at 03/05/14 1637  Gross per 24 hour   Intake    180 ml  Output   1601 ml  Net  -1421 ml   Filed Weights   03/02/14 1900 03/03/14 2021 03/04/14 1958  Weight: 83.689 kg (184 lb 8 oz) 83.5 kg (184 lb 1.4 oz) 88.86 kg (195 lb 14.4 oz)    Exam:   General:  Awake in bed, in nad  Cardiovascular: regular,s 1, s2  Respiratory: normal resp effort, no wheezing  Abdomen: soft, nondistended  Musculoskeletal: perfused, no clubbing   Data Reviewed: Basic Metabolic Panel:  Recent Labs Lab 03/01/14 1728 03/02/14 0110 03/02/14 0645 03/03/14 0630 03/03/14 2003 03/04/14 0700 03/05/14 0508  NA 137  --  141 139  --  136 135  K 5.6* 5.0 5.4* 4.6  --  4.0 4.0  CL 104  --  107 103  --  103 103  CO2 20  --  22 25  --  19 21  GLUCOSE 102*  --  94 162*  --  159* 114*  BUN 70*  --  65* 63*  --  61* 55*  CREATININE 4.97*  --  4.77* 4.54*  --  4.24* 4.04*  CALCIUM 9.0  --  8.8 8.6  --  8.1* 8.0*  MG  --   --   --   --  1.9  --   --    Liver Function Tests:  Recent Labs Lab 03/02/14 0645  AST 17  ALT 11  ALKPHOS 120*  BILITOT 0.7  PROT 7.1  ALBUMIN 2.5*   No results for input(s):  LIPASE, AMYLASE in the last 168 hours. No results for input(s): AMMONIA in the last 168 hours. CBC:  Recent Labs Lab 03/01/14 1728 03/02/14 0645 03/03/14 0630 03/05/14 0508  WBC 16.0* 16.2* 15.4* 13.4*  NEUTROABS 14.2*  --   --  9.7*  HGB 10.1* 9.6* 9.9* 8.9*  HCT 30.9* 30.2* 30.0* 28.0*  MCV 85.4 87.0 85.0 85.1  PLT 311 313 273 273   Cardiac Enzymes:  Recent Labs Lab 03/05/14 1405  TROPONINI 0.04*   BNP (last 3 results) No results for input(s): BNP in the last 8760 hours.  ProBNP (last 3 results)  Recent Labs  12/26/13 1640 12/27/13 0040  PROBNP 5360.0* 4645.0*    CBG:  Recent Labs Lab 03/04/14 2344 03/05/14 0428 03/05/14 0737 03/05/14 1203 03/05/14 1702  GLUCAP 114* 97 103* 110* 96    Recent Results (from the past 240 hour(s))  Urine culture     Status: None   Collection Time: 03/01/14  6:10 PM   Result Value Ref Range Status   Specimen Description URINE, CATHETERIZED  Final   Special Requests NONE  Final   Colony Count   Final    >=100,000 COLONIES/ML Performed at [facility]    Culture   Final    Multiple bacterial morphotypes present, none predominant. Suggest appropriate recollection if clinically indicated. Performed at [facility]    Report Status 03/02/2014 FINAL  Final  MRSA PCR Screening     Status: None   Collection Time: 03/01/14 11:55 PM  Result Value Ref Range Status   MRSA by PCR NEGATIVE NEGATIVE Final    Comment:        The GeneXpert MRSA Assay (FDA approved for NASAL specimens only), is one component of a comprehensive MRSA colonization surveillance program. It is not intended to diagnose MRSA infection nor to guide or monitor treatment for MRSA infections.   Culture, blood (routine x 2)     Status: None (Preliminary result)   Collection Time: 03/02/14  9:45 AM  Result Value Ref Range Status   Specimen Description BLOOD RIGHT HAND  Final   Special Requests BOTTLES DRAWN AEROBIC AND ANAEROBIC 10CC  Final   Culture   Final           BLOOD CULTURE RECEIVED NO GROWTH TO DATE CULTURE WILL BE HELD FOR 5 DAYS BEFORE ISSUING A FINAL NEGATIVE REPORT Performed at [facility]    Report Status PENDING  Incomplete  Culture, blood (routine x 2)     Status: None (Preliminary result)   Collection Time: 03/02/14  9:58 AM  Result Value Ref Range Status   Specimen Description BLOOD LEFT HAND  Final   Special Requests BOTTLES DRAWN AEROBIC AND ANAEROBIC 10CC  Final   Culture   Final           BLOOD CULTURE RECEIVED NO GROWTH TO DATE CULTURE WILL BE HELD FOR 5 DAYS BEFORE ISSUING A FINAL NEGATIVE REPORT Performed at [facility]    Report Status PENDING  Incomplete  Culture, Urine     Status: None   Collection Time: 03/03/14 10:54 AM  Result Value Ref Range Status   Specimen Description URINE, CLEAN CATCH  Final   Special  Requests NONE  Final   Colony Count NO GROWTH Performed at [facility]   Final   Culture NO GROWTH Performed at [facility]   Final   Report Status 03/04/2014 FINAL  Final     Studies: Dg Chest  Port 1 View  03/05/2014   CLINICAL DATA:  Hypoxia.  EXAM: PORTABLE CHEST - 1 VIEW  COMPARISON:  March 01, 2014.  FINDINGS: Stable cardiomegaly. Stable mild central pulmonary vascular congestion is noted. No consolidative process is noted. No pneumothorax or pleural effusion is noted. Bony thorax is intact.  IMPRESSION: Stable cardiomegaly and central pulmonary vascular congestion is noted compared to prior exam.   Electronically Signed   By: Sabino Dick M.D.   On: 03/05/2014 08:25    Scheduled Meds: . antiseptic oral rinse  7 mL Mouth Rinse BID  . aspirin  300 mg Rectal Daily   Or  . aspirin EC  325 mg Oral Daily  . collagenase   Topical Daily  . feeding supplement (ENSURE)  1 Container Oral BID BM  . heparin  5,000 Units Subcutaneous 3 times per day  . insulin aspart  0-9 Units Subcutaneous 6 times per day  . piperacillin-tazobactam (ZOSYN)  IV  2.25 g Intravenous 3 times per day  . protein supplement  1 scoop Oral TID WC  . timolol  1 drop Left Eye BID   Continuous Infusions:    Principal Problem:   Acute encephalopathy Active Problems:   Type 2 diabetes, controlled, with peripheral circulatory disorder   Essential hypertension   TIA (transient ischemic attack)   UTI (lower urinary tract infection)   Acute on chronic renal failure   Hyperkalemia   Chronic diastolic CHF (congestive heart failure)   [facility] [place] [facility] Pager [phone]. If 7PM-7AM, please contact night-coverage at [email or web address], password [facility] 03/05/2014, 6:11 PM  LOS: 4 days

## 2014-03-05 NOTE — Progress Notes (Signed)
PT Cancellation Note  Patient Details Name: [name] MRN: [id number] DOB: March 26, 1940   Cancelled Treatment:    Reason Eval/Treat Not Completed: Medical issues which prohibited therapy  Noted recent order for stat EKG and troponins q 6 hours. First troponin slightly elevated. Will defer PT eval and activity until pt medically stable to proceed with incr activity.  [name] 03/05/2014, 3:21 PM Pager [phone]

## 2014-03-05 NOTE — H&P (Signed)
Reason for Consult: AKI,CKD  Referring Physician: [facility]   HPI: [name] is an 74 y.o. 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  Admitted on 03/01/2014 for hyperkalemia. Patient is a poor historian. [facility] ED for hyperkalemia. Per review of the chart, she is a resident of [facility] and the staff reported that she had not been acting herself. She recently had a decrease in her PO.    Today she denied any dysuria or abdominal pain.    Trend in Creatinine: CREATININE  Date/Time Value Ref Range Status  01/11/2014 1.5* 0.5 - 1.1 mg/dL Final   CREATININE, SER  Date/Time Value Ref Range Status  03/05/2014 05:08 AM 4.04* 0.50 - 1.10 mg/dL Final  03/04/2014 07:00 AM 4.24* 0.50 - 1.10 mg/dL Final  03/03/2014 06:30 AM 4.54* 0.50 - 1.10 mg/dL Final  03/02/2014 06:45 AM 4.77* 0.50 - 1.10 mg/dL Final  03/01/2014 05:28 PM 4.97* 0.50 - 1.10 mg/dL Final  01/07/2014 05:09 AM 2.06* 0.50 - 1.10 mg/dL Final  01/06/2014 04:55 AM 2.17* 0.50 - 1.10 mg/dL Final  01/05/2014 04:50 AM 2.46* 0.50 - 1.10 mg/dL Final  01/04/2014 04:03 AM 2.61* 0.50 - 1.10 mg/dL Final  01/03/2014 08:47 AM 2.79* 0.50 - 1.10 mg/dL Final  01/02/2014 04:25 AM 2.90* 0.50 - 1.10 mg/dL Final  01/01/2014 04:12 AM 3.09* 0.50 - 1.10 mg/dL Final  12/31/2013 05:00 AM 2.69* 0.50 - 1.10 mg/dL Final  12/30/2013 06:00 AM 2.10* 0.50 - 1.10 mg/dL Final  12/29/2013 04:55 AM 1.66* 0.50 - 1.10 mg/dL Final  12/28/2013 08:12 PM 1.53* 0.50 - 1.10 mg/dL Final  12/28/2013 04:45 AM 1.19* 0.50 - 1.10 mg/dL Final  12/27/2013 05:14 AM 0.97 0.50 - 1.10 mg/dL Final  12/27/2013 12:40 AM 0.99 0.50 - 1.10 mg/dL Final  12/26/2013 04:25 PM 1.01 0.50 - 1.10 mg/dL Final  12/19/2013 06:38 AM 0.92 0.50 - 1.10 mg/dL Final  12/18/2013 12:13 AM 0.73 0.50 - 1.10 mg/dL Final  09/07/2013 10:02 AM  0.98 0.50 - 1.10 mg/dL Final  06/28/2012 06:00 AM 1.55* 0.50 - 1.10 mg/dL Final  06/27/2012 06:35 AM 2.03* 0.50 - 1.10 mg/dL Final  06/26/2012 06:00 AM 2.70* 0.50 - 1.10 mg/dL Final  06/25/2012 09:50 AM 3.49* 0.50 - 1.10 mg/dL Final  06/25/2012 02:21 AM 3.78* 0.50 - 1.10 mg/dL Final  06/24/2012 08:15 PM 4.15* 0.50 - 1.10 mg/dL Final  06/17/2012 11:45 PM 1.09 0.50 - 1.10 mg/dL Final  02/26/2012 07:10 AM 1.11* 0.50 - 1.10 mg/dL Final  02/25/2012 06:00 AM 1.15* 0.50 - 1.10 mg/dL Final  02/24/2012 05:20 AM 1.14* 0.50 - 1.10 mg/dL Final  02/23/2012 05:00 AM 1.28* 0.50 - 1.10 mg/dL Final  02/22/2012 05:40 AM 1.70* 0.50 - 1.10 mg/dL Final  02/21/2012 06:09 AM 2.28* 0.50 - 1.10 mg/dL Final  02/20/2012 05:00 AM 2.82* 0.50 - 1.10 mg/dL Final  02/19/2012 08:30 AM 2.68* 0.50 - 1.10 mg/dL Final  02/18/2012 08:49 AM 2.57* 0.50 - 1.10 mg/dL Final  02/16/2012 06:58 AM 1.73* 0.50 - 1.10 mg/dL Final  02/15/2012 03:45 AM 1.48* 0.50 - 1.10 mg/dL Final  02/14/2012 10:45 AM 1.48* 0.50 - 1.10 mg/dL Final  06/29/2011 05:31 AM 1.49* 0.50 - 1.10 mg/dL Final  06/28/2011 05:20 AM 1.52* 0.50 - 1.10 mg/dL Final  06/27/2011 05:35 AM 1.57* 0.50 - 1.10 mg/dL Final  06/26/2011 04:55 AM  1.46* 0.50 - 1.10 mg/dL Final  06/25/2011 06:57 AM 1.36* 0.50 - 1.10 mg/dL Final    Comment:    DELTA CHECK NOTED REPEATED TO VERIFY  06/23/2011 09:00 AM 0.73 0.50 - 1.10 mg/dL Final  06/22/2011 01:15 PM 0.89 0.50 - 1.10 mg/dL Final  03/18/2011 11:46 PM 0.94 0.50 - 1.10 mg/dL Final  11/03/2010 09:02 PM 1.21* 0.50 - 1.10 mg/dL Final  05/17/2009 07:00 AM 0.95 0.4 - 1.2 mg/dL Final    PMH:   Past Medical History  Diagnosis Date  . Hypertension   . Diabetes mellitus   . Blindness   . TIA (transient ischemic attack)   . Anemia   . GERD (gastroesophageal reflux disease)   . Constipation   . Renal disorder 05/2012    acute Renal Failure  . SYNCOPE 05/19/2009    Annotation: multiple over past 1 yr  Qualifier: Diagnosis of  By:  [name] MD, [name]    . CHOLECYSTECTOMY, HX OF 05/19/2009    Qualifier: Diagnosis of  By: [name] MD, [name]      PSH:   Past Surgical History  Procedure Laterality Date  . Gallbladder surgery    . Esophageal dilation    . Cholecystectomy    . Amputation  02/18/2012    Procedure: AMPUTATION RAY;  Surgeon: [name], MD;  Location: [facility];  Service: Orthopedics;  Laterality: Right;  right second toe  . Tee without cardioversion  02/21/2012    Procedure: TRANSESOPHAGEAL ECHOCARDIOGRAM (TEE);  Surgeon: [name], MD;  Location: [facility] ENDOSCOPY;  Service: Cardiovascular;  Laterality: N/A;   spoke with [name] from [place] pt will arrive by 815ish( thy change shifts at [place])  . Tee without cardioversion  02/25/2012    Procedure: TRANSESOPHAGEAL ECHOCARDIOGRAM (TEE);  Surgeon: [name], MD;  Location: [place];  Service: Cardiovascular;  Laterality: N/A;  . Amputation Right 09/07/2013    Procedure: AMPUTATION RAY;  Surgeon: [name], MD;  Location: [place];  Service: Orthopedics;  Laterality: Right;  Right Transmetatarsal Amputation    Allergies: No Known Allergies  Medications:   Prior to Admission medications   Medication Sig Start Date End Date Taking? Authorizing Provider  acetaminophen (TYLENOL) 325 MG tablet Take 650 mg by mouth every 6 (six) hours as needed for fever.    Yes Historical Provider, MD  Amino Acids-Protein Hydrolys (PRO-STAT) LIQD Take 30 mLs by mouth 2 (two) times daily.   Yes Historical Provider, MD  aspirin EC 325 MG tablet Take 325 mg by mouth daily.     Yes Historical Provider, MD  atorvastatin (LIPITOR) 10 MG tablet Take 1 tablet (10 mg total) by mouth daily. 01/25/14  Yes Monica [employer], DO  carvedilol (COREG) 3.125 MG tablet Take 3.125 mg by mouth 2 (two) times daily with a meal.   Yes Historical Provider, MD  docusate sodium (COLACE) 100 MG capsule Take 100 mg by mouth 2 (two) times daily.   Yes Historical Provider, MD  DULoxetine  (CYMBALTA) 60 MG capsule Take 1 capsule (60 mg total) by mouth daily. When completes 2 wks at 30mg  09/19/13  Yes [name], DO  furosemide (LASIX) 20 MG tablet Take 20 mg by mouth every morning.   Yes Historical Provider, MD  gabapentin (NEURONTIN) 100 MG capsule Take 3 capsules (300 mg total) by mouth at bedtime. Phantom pain 01/07/14  Yes [name], MD  HYDROcodone-acetaminophen (NORCO/VICODIN) 5-325 MG per tablet Take one tablet by mouth every 6 hours as needed  for pain NOT TO EXCEED 3000 MG OF APAP 02/10/14  Yes Monica Shamsid-Deen Carter, DO  hydrocortisone (ANUSOL-HC) 2.5 % rectal cream Place 1 application rectally 2 (two) times daily as needed for hemorrhoids.    Yes Historical Provider, MD  insulin aspart (NOVOLOG) 100 UNIT/ML injection 0-9 Units, Subcutaneous, 3 times daily with meals,  CBG < 70: implement hypoglycemia protocol CBG 70 - 120: 0 units CBG 121 - 150: 1 unit CBG 151 - 200: 2 units CBG 201 - 250: 3 units CBG 251 - 300: 5 units CBG 301 - 350: 7 units CBG 351 - 400: 9 units CBG > 400: call MD Patient taking differently: Inject 5 Units into the skin 3 (three) times daily with meals. 5 units prior to meals for cbg >=150 12/21/13  Yes [name], MD  ipratropium-albuterol (DUONEB) 0.5-2.5 (3) MG/3ML SOLN Take 3 mLs by nebulization every 6 (six) hours as needed (wheezing).   Yes Historical Provider, MD  metoCLOPramide (REGLAN) 5 MG tablet Take 1 tablet (5 mg total) by mouth 4 (four) times daily -  before meals and at bedtime. 12/22/13  Yes [name], MD  Multiple Vitamins-Minerals (MULTIVITAMIN WITH MINERALS) tablet Take 1 tablet by mouth daily.   Yes Historical Provider, MD  polyethylene glycol (MIRALAX / GLYCOLAX) packet Take 17 g by mouth daily. 06/18/12  Yes [name], MD  potassium chloride (K-DUR,KLOR-CON) 10 MEQ tablet Take 10 mEq by mouth daily.   Yes Historical Provider, MD  PRESCRIPTION MEDICATION Take 60-90 mLs by mouth 5 (five) times daily. 2 Cal  Supplement 60 ml at 10a & 10p 90 ml at 10a, 2p, & 8p   Yes Historical Provider, MD  protein supplement (RESOURCE BENEPROTEIN) POWD Take 1 scoop by mouth 3 (three) times daily with meals.   Yes Historical Provider, MD  sodium chloride (OCEAN) 0.65 % SOLN nasal spray Place 1 spray into the nose 4 (four) times daily as needed for congestion. 06/29/11  Yes [name], MD  timolol (TIMOPTIC) 0.5 % ophthalmic solution Place 1 drop into the left eye 2 (two) times daily.   Yes Historical Provider, MD  Brinzolamide-Brimonidine [facility]) 1-0.2 % SUSP Place 1 drop into the left eye 2 (two) times daily.    Historical Provider, MD  collagenase (SANTYL) ointment Apply topically daily. 12/21/13   [name], MD  feeding supplement, ENSURE, (ENSURE) PUDG Take 1 Container by mouth 2 (two) times daily between meals. 01/07/14   [name], MD  promethazine (PHENERGAN) 25 MG tablet Take 25 mg by mouth every 6 (six) hours as needed for nausea or vomiting.    Historical Provider, MD    Inpatient medications: . antiseptic oral rinse  7 mL Mouth Rinse BID  . aspirin  300 mg Rectal Daily   Or  . aspirin EC  325 mg Oral Daily  . collagenase   Topical Daily  . feeding supplement (ENSURE)  1 Container Oral BID BM  . heparin  5,000 Units Subcutaneous 3 times per day  . insulin aspart  0-9 Units Subcutaneous 6 times per day  . piperacillin-tazobactam (ZOSYN)  IV  2.25 g Intravenous 3 times per day  . protein supplement  1 scoop Oral TID WC  . timolol  1 drop Left Eye BID    Discontinued Meds:   Medications Discontinued During This Encounter  Medication Reason  . insulin aspart (novoLOG) injection 10 Units   . sodium polystyrene (KAYEXALATE) 15 GM/60ML suspension 30 g   .  docusate sodium (COLACE) capsule 100 mg   . DULoxetine (CYMBALTA) DR capsule 30 mg   . folic acid (FOLVITE) tablet 1 mg   . gabapentin (NEURONTIN) capsule 300 mg   . metoCLOPramide (REGLAN) tablet 5 mg   . multivitamin with minerals  tablet 1 tablet   . polyethylene glycol (MIRALAX / GLYCOLAX) packet 17 g   . thiamine (VITAMIN B-1) tablet 100 mg   . 0.9 %  sodium chloride infusion   . atorvastatin (LIPITOR) tablet 10 mg   . carvedilol (COREG) tablet 3.125 mg   . aspirin EC tablet 325 mg   . insulin aspart (novoLOG) injection 0-15 Units   . 0.9 %  sodium chloride infusion   . dextrose 5 %-0.45 % sodium chloride infusion     Social History:  reports that she quit smoking about 14 years ago. Her smokeless tobacco use includes Snuff. She reports that she does not drink alcohol or use illicit drugs.  Family History:   Family History  Problem Relation Age of Onset  . Heart failure Mother     Pertinent items are noted in HPI. Weight change: 11 lb 13.1 oz (5.36 kg)  Intake/Output Summary (Last 24 hours) at 03/05/14 1529 Last data filed at 03/05/14 1527  Gross per 24 hour  Intake    180 ml  Output   1151 ml  Net   -971 ml   BP 138/57 mmHg  Pulse 61  Temp(Src) 98.7 F (37.1 C) (Axillary)  Resp 18  Wt 195 lb 14.4 oz (88.86 kg)  SpO2 94% Filed Vitals:   03/04/14 1958 03/05/14 0434 03/05/14 0922 03/05/14 1323  BP: 139/48 134/64 120/53 138/57  Pulse: 65 77 61 61  Temp: 97.4 F (36.3 C) 97.6 F (36.4 C) 98.7 F (37.1 C)   TempSrc: Oral Axillary Axillary   Resp: 16 16 16 18   Weight: 195 lb 14.4 oz (88.86 kg)     SpO2: 90% 94% 94% 94%   Gen: NAD, alert, elderly, appears drowsy during interview but her nurse reports this was no acute change CV: RRR, good S1/S2, no murmur, no edema, capillary refill brisk  Resp: CTABL, no wheezes, non-labored Abd: SNTND, BS present, no guarding or organomegaly Skin: no rashes, normal turgor  Ext: soft boot on right foot  Neuro: oriented to self but not year or town   Labs: Basic Metabolic Panel:  Recent Labs Lab 03/01/14 1728 03/02/14 0110 03/02/14 0645 03/03/14 0630 03/04/14 0700 03/05/14 0508  NA 137  --  141 139 136 135  K 5.6* 5.0 5.4* 4.6 4.0 4.0  CL 104   --  107 103 103 103  CO2 20  --  22 25 19 21   GLUCOSE 102*  --  94 162* 159* 114*  BUN 70*  --  65* 63* 61* 55*  CREATININE 4.97*  --  4.77* 4.54* 4.24* 4.04*  ALBUMIN  --   --  2.5*  --   --   --   CALCIUM 9.0  --  8.8 8.6 8.1* 8.0*   Liver Function Tests:  Recent Labs Lab 03/02/14 0645  AST 17  ALT 11  ALKPHOS 120*  BILITOT 0.7  PROT 7.1  ALBUMIN 2.5*   No results for input(s): LIPASE, AMYLASE in the last 168 hours. No results for input(s): AMMONIA in the last 168 hours. CBC:  Recent Labs Lab 03/01/14 1728 03/02/14 0645 03/03/14 0630 03/05/14 0508  WBC 16.0* 16.2* 15.4* 13.4*  NEUTROABS 14.2*  --   --  9.7*  HGB 10.1* 9.6* 9.9* 8.9*  HCT 30.9* 30.2* 30.0* 28.0*  MCV 85.4 87.0 85.0 85.1  PLT 311 313 273 273   PT/INR: @LABRCNTIP (inr:5) Cardiac Enzymes: ) Recent Labs Lab 03/05/14 1405  TROPONINI 0.04*   CBG:  Recent Labs Lab 03/04/14 1949 03/04/14 2344 03/05/14 0428 03/05/14 0737 03/05/14 1203  GLUCAP 147* 114* 97 103* 110*    Iron Studies: No results for input(s): IRON, TIBC, TRANSFERRIN, FERRITIN in the last 168 hours.  Xrays/Other Studies: Dg Chest Port 1 View  03/05/2014   CLINICAL DATA:  Hypoxia.  EXAM: PORTABLE CHEST - 1 VIEW  COMPARISON:  March 01, 2014.  FINDINGS: Stable cardiomegaly. Stable mild central pulmonary vascular congestion is noted. No consolidative process is noted. No pneumothorax or pleural effusion is noted. Bony thorax is intact.  IMPRESSION: Stable cardiomegaly and central pulmonary vascular congestion is noted compared to prior exam.   Electronically Signed   By: Sabino Dick M.D.   On: 03/05/2014 08:25     Assessment/Plan: 1. AKI/CKD baseline Scr 1.2-2. Increase to 4.9 on adm and now decreased to 4 today. Renal US showing right kidney normal but unable to visualize left kidney (though 2 kidneys seen on CT abd in 11/15)  UA showing 100 protein and large leukocytes. 2. UTI w/ sepsis: on ABX. Multiple bacteria on first  culture. Second culture NGTD but was on AB. 3. Acute encephalopathy 4. Hyperkalemia: resolved.  5. DM2 6. HTN 7. Hx TIA  8. Chronic diastolic CHF    Rosemarie Ax 03/05/2014, 3:29 PM  I have seen and examined this patient and agree with plan Dr Raeford Razor.  74yo BF admitted 03/01/14 for ? UTI poor po intake, ARF.  Renal fx is improving and we are essentially asked to evaluate her fluid status.  I/O have not been accurately recorded though she is making urine as there is about 500cc in the foley bag.  She is unable to give a history which is her baseline. Lung exam is difficult as she does not take deep breaths, she has no edema, or presacral edema and SBP 130's.  She is not taking anything PO today as she says "she doesn't feel like it" though she was eating and drinking yest.  For now I would not change anything and actually start collecting I/O's data.  Daily Scr. Arch Methot T,MD 03/05/2014 4:26 PM

## 2014-03-05 NOTE — Evaluation (Signed)
Physical Therapy Evaluation Patient Details Name: [name] MRN: [id number] DOB: 02/16/1940 Today's Date: 03/05/2014   History of Present Illness  74 yo female who is long term care resident has been admitted for acute encephalopathy related to UTI   Clinical Impression  Patient likely bedbound recently at SNF but noted from last admission was transferring to chair with staff assist. Will benefit from skilled PT in the acute setting to address mobility deficits and to decrease burden of care in next venue.    Follow Up Recommendations SNF    Equipment Recommendations  None recommended by PT    Recommendations for Other Services       Precautions / Restrictions Precautions Precautions: Fall      Mobility  Bed Mobility Overal bed mobility: Needs Assistance Bed Mobility: Rolling Rolling: Total assist         General bed mobility comments: pt lethargic and attempted to reach to rail with assist, but did not participate in rolling, therefore further mobility deferred due to safety issues with one assist; had RN to assist to scoot to head of bed for positioning  Transfers                    Ambulation/Gait                Stairs            Wheelchair Mobility    Modified Rankin (Stroke Patients Only)       Balance                                             Pertinent Vitals/Pain Pain Assessment: No/denies pain    Home Living Family/patient expects to be discharged to:: Skilled nursing facility                      Prior Function Level of Independence: Needs assistance   Gait / Transfers Assistance Needed: per nursing has been bedbound  ADL's / Homemaking Assistance Needed: SNF resident        Hand Dominance        Extremity/Trunk Assessment   Upper Extremity Assessment: LUE deficits/detail;RUE deficits/detail RUE Deficits / Details: AAROM limited to about 95 degrees shoulder elevation, weak grip      LUE Deficits / Details: AAROM limited to about 95 degrees shoulder elevation, weak grip   Lower Extremity Assessment: LLE deficits/detail;RLE deficits/detail RLE Deficits / Details: AAROM tight heel cords bilaterally only to approx 45 degrees dorsiflexion, knee flexion with time approx 45 degrees AAROM LLE Deficits / Details: tight heel cords as above, knee flexion about to 90 with assist     Communication      Cognition Arousal/Alertness: Lethargic Behavior During Therapy: Flat affect Overall Cognitive Status: Difficult to assess                      General Comments      Exercises Other Exercises Other Exercises: performed PROM/AAROM while assessing joints in UE and LE's      Assessment/Plan    PT Assessment Patient needs continued PT services  PT Diagnosis Generalized weakness   PT Problem List Decreased mobility;Decreased strength;Decreased activity tolerance;Decreased range of motion;Impaired tone  PT Treatment Interventions Therapeutic activities;Therapeutic exercise;Patient/family education;Functional mobility training   PT Goals (Current goals can be found in the Care Plan  section) Acute Rehab PT Goals Patient Stated Goal: None stated PT Goal Formulation: Patient unable to participate in goal setting Time For Goal Achievement: 03/19/14 Potential to Achieve Goals: Fair    Frequency Min 2X/week   Barriers to discharge        Co-evaluation               End of Session   Activity Tolerance: Patient limited by lethargy Patient left: in bed;with call bell/phone within reach Nurse Communication: Mobility status         Time: 1335-1350 PT Time Calculation (min) (ACUTE ONLY): 15 min   Charges:   PT Evaluation $Initial PT Evaluation Tier I: 1 Procedure PT Treatments $Therapeutic Activity: 8-22 mins   PT G Codes:        [name],Andrea 2014-03-27, 4:24 PM Magda Kiel, [place] 27-Mar-2014

## 2014-03-06 LAB — BASIC METABOLIC PANEL
Anion gap: 12 (ref 5–15)
BUN: 54 mg/dL — AB (ref 6–23)
CHLORIDE: 104 mmol/L (ref 96–112)
CO2: 20 mmol/L (ref 19–32)
CREATININE: 3.73 mg/dL — AB (ref 0.50–1.10)
Calcium: 8.4 mg/dL (ref 8.4–10.5)
GFR calc Af Amer: 13 mL/min — ABNORMAL LOW (ref >90)
GFR, EST NON AFRICAN AMERICAN: 11 mL/min — AB (ref >90)
Glucose, Bld: 110 mg/dL — ABNORMAL HIGH (ref 70–99)
POTASSIUM: 4.3 mmol/L (ref 3.5–5.1)
Sodium: 136 mmol/L (ref 135–145)

## 2014-03-06 LAB — GLUCOSE, CAPILLARY
Glucose-Capillary: 97 mg/dL (ref 70–99)
Glucose-Capillary: 97 mg/dL (ref 70–99)
Glucose-Capillary: 106 mg/dL — ABNORMAL HIGH (ref 70–99)
Glucose-Capillary: 104 mg/dL — ABNORMAL HIGH (ref 70–99)
Glucose-Capillary: 149 mg/dL — ABNORMAL HIGH (ref 70–99)
Glucose-Capillary: 155 mg/dL — ABNORMAL HIGH (ref 70–99)

## 2014-03-06 LAB — TROPONIN I: Troponin I: 0.05 ng/mL — ABNORMAL HIGH (ref <0.031)

## 2014-03-06 LAB — IRON AND TIBC
Iron: 69 ug/dL (ref 42–145)
SATURATION RATIOS: 48 % (ref 20–55)
TIBC: 143 ug/dL — AB (ref 250–470)
UIBC: 74 ug/dL — AB (ref 125–400)

## 2014-03-06 LAB — FERRITIN: Ferritin: 182 ng/mL (ref 10–291)

## 2014-03-06 MED ORDER — SODIUM CHLORIDE 0.9 % IV SOLN
INTRAVENOUS | Status: DC
  Administered 2014-03-06: 50 mL/h via INTRAVENOUS
  Administered 2014-03-07 – 2014-03-11 (×4): via INTRAVENOUS

## 2014-03-06 NOTE — Progress Notes (Signed)
OT Cancellation Note  Patient Details Name: [name] MRN: [id number] DOB: [birth year]   Cancelled Treatment:    Reason Eval/Treat Not Completed: Other (comment) Pt is Medicare and likely bedbound from SNF. Current D/C plan is SNF. No apparent immediate acute care OT needs, therefore will defer OT to SNF. If OT eval is needed please call Acute Rehab Dept. at [phone] or text page OT at [phone].    Villa Herb M   Cyndie Chime, OTR/L Occupational Therapist [phone] (pager)  03/06/2014, 1:49 PM

## 2014-03-06 NOTE — Progress Notes (Signed)
[facility] PROGRESS NOTE  [name] OVF:[id number] DOB: 09/28/1940 DOA: 03/01/2014 PCP: Gildardo Cranker, DO  Assessment/Plan: 1. Acute encephalopathy 1. Asleep this AM, arousable but won't open eyes 2. Suspect related to UTI with sepsis with acute renal failure below 2. UTI with sepsis 1. On initial urine cx, multiple organisms on urine culture 2. Remains on empiric zosyn 3. Repeat urine cx without growth 4. Leukocytosis improving. Afebrile 3. ARF 1. Cr is now showing improvement 2. Continue IVF as tolerated 3. Appreciate Nephro input 4. DM2 1. Glucose stable 2. Cont on SSI coverage 5. HTN 1. bp remains stable and controlled 2. Cont current regimen 6. Hx TIA 1. stable 7. Hyperkalemia 1. Corrected yesterday 2. Follow lytes and correct as needed 8. Chronic diastolic CHF 1. Last documented BNP from 11/15 noted to be 4645 2. Pitting edema noted on LE 3. Will repeat BNP to r/o acute heart failure 4. CXR with vascular congestion, but repeat cxr stable 5. Cont IVF for the time being secondary to above 9. DVT prophylaxis 1. Heparin subQ 10. Wide complex arrhythmia 1. Noted briefly on 2/5 2. Had discussed and reviewed rhythm strip with Cardiology 3. Possible brief period of afib noted followed by short burst of vtach 4. Follow up EKG with sinus rhythm 5. Lytes this AM unremarkable 6. Discussed with Cardiology with recommendation for low dose beta blocker and continue cardiac monitoring 7. Thus far stable  Code Status: Full Family Communication: Pt in room Disposition Plan: Pending   Consultants:  None  Procedures:    Antibiotics:  Zosyn 2/2>>> (indicate start date, and stop date if known)  HPI/Subjective: No acute events reported overnight   Objective: Filed Vitals:   03/06/14 0402 03/06/14 0544 03/06/14 1026 03/06/14 1725  BP: 140/68  102/63 138/58  Pulse: 66  71 117  Temp: 97.7 F (36.5 C)  97.4 F (36.3 C) 98.3 F (36.8 C)  TempSrc:  Axillary  Axillary Oral  Resp: 16  16 16   Weight:      SpO2: 68% 97% 96% 99%    Intake/Output Summary (Last 24 hours) at 03/06/14 1843 Last data filed at 03/06/14 1833  Gross per 24 hour  Intake    436 ml  Output      0 ml  Net    436 ml   Filed Weights   03/03/14 2021 03/04/14 1958 03/05/14 2005  Weight: 83.5 kg (184 lb 1.4 oz) 88.86 kg (195 lb 14.4 oz) 86.546 kg (190 lb 12.8 oz)    Exam:   General:  Awake in bed, in nad  Cardiovascular: regular,s 1, s2  Respiratory: normal resp effort, no wheezing  Abdomen: soft, nondistended  Musculoskeletal: perfused, no clubbing   Data Reviewed: Basic Metabolic Panel:  Recent Labs Lab 03/02/14 0645 03/03/14 0630 03/03/14 2003 03/04/14 0700 03/05/14 0508 03/06/14 0525  NA 141 139  --  136 135 136  K 5.4* 4.6  --  4.0 4.0 4.3  CL 107 103  --  103 103 104  CO2 22 25  --  19 21 20   GLUCOSE 94 162*  --  159* 114* 110*  BUN 65* 63*  --  61* 55* 54*  CREATININE 4.77* 4.54*  --  4.24* 4.04* 3.73*  CALCIUM 8.8 8.6  --  8.1* 8.0* 8.4  MG  --   --  1.9  --   --   --    Liver Function Tests:  Recent Labs Lab 03/02/14 0645  AST 17  ALT 11  ALKPHOS 120*  BILITOT 0.7  PROT 7.1  ALBUMIN 2.5*   No results for input(s): LIPASE, AMYLASE in the last 168 hours. No results for input(s): AMMONIA in the last 168 hours. CBC:  Recent Labs Lab 03/01/14 1728 03/02/14 0645 03/03/14 0630 03/05/14 0508  WBC 16.0* 16.2* 15.4* 13.4*  NEUTROABS 14.2*  --   --  9.7*  HGB 10.1* 9.6* 9.9* 8.9*  HCT 30.9* 30.2* 30.0* 28.0*  MCV 85.4 87.0 85.0 85.1  PLT 311 313 273 273   Cardiac Enzymes:  Recent Labs Lab 03/05/14 1405 03/05/14 1940 03/06/14 0125  TROPONINI 0.04* 0.07* 0.05*   BNP (last 3 results) No results for input(s): BNP in the last 8760 hours.  ProBNP (last 3 results)  Recent Labs  12/26/13 1640 12/27/13 0040  PROBNP 5360.0* 4645.0*    CBG:  Recent Labs Lab 03/05/14 2346 03/06/14 0359 03/06/14 0759  03/06/14 1239 03/06/14 1730  GLUCAP 103* 97 97 106* 104*    Recent Results (from the past 240 hour(s))  Urine culture     Status: None   Collection Time: 03/01/14  6:10 PM  Result Value Ref Range Status   Specimen Description URINE, CATHETERIZED  Final   Special Requests NONE  Final   Colony Count   Final    >=100,000 COLONIES/ML Performed at [facility]    Culture   Final    Multiple bacterial morphotypes present, none predominant. Suggest appropriate recollection if clinically indicated. Performed at [facility]    Report Status 03/02/2014 FINAL  Final  MRSA PCR Screening     Status: None   Collection Time: 03/01/14 11:55 PM  Result Value Ref Range Status   MRSA by PCR NEGATIVE NEGATIVE Final    Comment:        The GeneXpert MRSA Assay (FDA approved for NASAL specimens only), is one component of a comprehensive MRSA colonization surveillance program. It is not intended to diagnose MRSA infection nor to guide or monitor treatment for MRSA infections.   Culture, blood (routine x 2)     Status: None (Preliminary result)   Collection Time: 03/02/14  9:45 AM  Result Value Ref Range Status   Specimen Description BLOOD RIGHT HAND  Final   Special Requests BOTTLES DRAWN AEROBIC AND ANAEROBIC 10CC  Final   Culture   Final           BLOOD CULTURE RECEIVED NO GROWTH TO DATE CULTURE WILL BE HELD FOR 5 DAYS BEFORE ISSUING A FINAL NEGATIVE REPORT Performed at [facility]    Report Status PENDING  Incomplete  Culture, blood (routine x 2)     Status: None (Preliminary result)   Collection Time: 03/02/14  9:58 AM  Result Value Ref Range Status   Specimen Description BLOOD LEFT HAND  Final   Special Requests BOTTLES DRAWN AEROBIC AND ANAEROBIC 10CC  Final   Culture   Final           BLOOD CULTURE RECEIVED NO GROWTH TO DATE CULTURE WILL BE HELD FOR 5 DAYS BEFORE ISSUING A FINAL NEGATIVE REPORT Performed at [facility]    Report Status PENDING   Incomplete  Culture, Urine     Status: None   Collection Time: 03/03/14 10:54 AM  Result Value Ref Range Status   Specimen Description URINE, CLEAN CATCH  Final   Special Requests NONE  Final   Colony Count NO GROWTH Performed at [facility]   Final   Culture NO GROWTH  Performed at [facility]   Final   Report Status 03/04/2014 FINAL  Final     Studies: Dg Chest Port 1 View  03/05/2014   CLINICAL DATA:  Hypoxia.  EXAM: PORTABLE CHEST - 1 VIEW  COMPARISON:  March 01, 2014.  FINDINGS: Stable cardiomegaly. Stable mild central pulmonary vascular congestion is noted. No consolidative process is noted. No pneumothorax or pleural effusion is noted. Bony thorax is intact.  IMPRESSION: Stable cardiomegaly and central pulmonary vascular congestion is noted compared to prior exam.   Electronically Signed   By: Sabino Dick M.D.   On: 03/05/2014 08:25    Scheduled Meds: . antiseptic oral rinse  7 mL Mouth Rinse BID  . aspirin  300 mg Rectal Daily   Or  . aspirin EC  325 mg Oral Daily  . collagenase   Topical Daily  . feeding supplement (ENSURE)  1 Container Oral BID BM  . heparin  5,000 Units Subcutaneous 3 times per day  . insulin aspart  0-9 Units Subcutaneous 6 times per day  . metoprolol tartrate  12.5 mg Oral BID  . piperacillin-tazobactam (ZOSYN)  IV  2.25 g Intravenous 3 times per day  . protein supplement  1 scoop Oral TID WC  . timolol  1 drop Left Eye BID   Continuous Infusions: . sodium chloride      Principal Problem:   Acute encephalopathy Active Problems:   Type 2 diabetes, controlled, with peripheral circulatory disorder   Essential hypertension   TIA (transient ischemic attack)   UTI (lower urinary tract infection)   Acute on chronic renal failure   Hyperkalemia   Chronic diastolic CHF (congestive heart failure)   Kylea Berrong, [facility] Pager [phone]. If 7PM-7AM, please contact night-coverage at [email or web address], password  [facility] 03/06/2014, 6:43 PM  LOS: 5 days

## 2014-03-06 NOTE — Progress Notes (Signed)
Patient ID: [name], female    DOB: [birth year], 74 y.o.   MRN: [id number] Nephrology Progress Note  S: Sleeping in bed, no complaints  O:BP 140/68 mmHg  Pulse 66  Temp(Src) 97.7 F (36.5 C) (Axillary)  Resp 16  Wt 190 lb 12.8 oz (86.546 kg)  SpO2 97%  Intake/Output Summary (Last 24 hours) at 03/06/14 0957 Last data filed at 03/06/14 0929  Gross per 24 hour  Intake    496 ml  Output    451 ml  Net     45 ml   Intake/Output: I/O last 3 completed shifts: In: 44 [P.O.:180; IV Piggyback:200] Out: 1602 [Urine:1600; Stool:2]  Intake/Output this shift:  Total I/O In: 236 [P.O.:236] Out: -  Weight change: -5 lb 1.6 oz (-2.313 kg) Gen: Sleeping in bed but arouses to voice  CVS:RRR, S1S2 Resp:normal effort, no wheezing  KTG:YBWL, NTND, +BS  Ext: warm, soft boot on right foot    Recent Labs Lab 03/01/14 1728 03/02/14 0110 03/02/14 0645 03/03/14 0630 03/04/14 0700 03/05/14 0508 03/06/14 0525  NA 137  --  141 139 136 135 136  K 5.6* 5.0 5.4* 4.6 4.0 4.0 4.3  CL 104  --  107 103 103 103 104  CO2 20  --  22 25 19 21 20   GLUCOSE 102*  --  94 162* 159* 114* 110*  BUN 70*  --  65* 63* 61* 55* 54*  CREATININE 4.97*  --  4.77* 4.54* 4.24* 4.04* 3.73*  ALBUMIN  --   --  2.5*  --   --   --   --   CALCIUM 9.0  --  8.8 8.6 8.1* 8.0* 8.4  AST  --   --  17  --   --   --   --   ALT  --   --  11  --   --   --   --    Liver Function Tests:  Recent Labs Lab 03/02/14 0645  AST 17  ALT 11  ALKPHOS 120*  BILITOT 0.7  PROT 7.1  ALBUMIN 2.5*   No results for input(s): LIPASE, AMYLASE in the last 168 hours. No results for input(s): AMMONIA in the last 168 hours. CBC:  Recent Labs Lab 03/01/14 1728 03/02/14 0645 03/03/14 0630 03/05/14 0508  WBC 16.0* 16.2* 15.4* 13.4*  NEUTROABS 14.2*  --   --  9.7*  HGB 10.1* 9.6* 9.9* 8.9*  HCT 30.9* 30.2* 30.0* 28.0*  MCV 85.4 87.0 85.0 85.1  PLT 311 313 273 273   Cardiac Enzymes:  Recent Labs Lab 03/05/14 1405  03/05/14 1940 03/06/14 0125  TROPONINI 0.04* 0.07* 0.05*   CBG:  Recent Labs Lab 03/05/14 1702 03/05/14 1938 03/05/14 2346 03/06/14 0359 03/06/14 0759  GLUCAP 96 90 103* 97 97    Iron Studies:  Recent Labs  03/05/14 1940  IRON 69  TIBC 143*  FERRITIN 182   Studies/Results: Dg Chest Port 1 View  03/05/2014   CLINICAL DATA:  Hypoxia.  EXAM: PORTABLE CHEST - 1 VIEW  COMPARISON:  March 01, 2014.  FINDINGS: Stable cardiomegaly. Stable mild central pulmonary vascular congestion is noted. No consolidative process is noted. No pneumothorax or pleural effusion is noted. Bony thorax is intact.  IMPRESSION: Stable cardiomegaly and central pulmonary vascular congestion is noted compared to prior exam.   Electronically Signed   By: Andrea Dick M.D.   On: 03/05/2014 08:25   . antiseptic oral rinse  7 mL Mouth Rinse BID  .  aspirin  300 mg Rectal Daily   Or  . aspirin EC  325 mg Oral Daily  . collagenase   Topical Daily  . feeding supplement (ENSURE)  1 Container Oral BID BM  . heparin  5,000 Units Subcutaneous 3 times per day  . insulin aspart  0-9 Units Subcutaneous 6 times per day  . metoprolol tartrate  12.5 mg Oral BID  . piperacillin-tazobactam (ZOSYN)  IV  2.25 g Intravenous 3 times per day  . protein supplement  1 scoop Oral TID WC  . timolol  1 drop Left Eye BID    BMET    Component Value Date/Time   NA 136 03/06/2014 0525   NA 140 01/11/2014   K 4.3 03/06/2014 0525   CL 104 03/06/2014 0525   CO2 20 03/06/2014 0525   GLUCOSE 110* 03/06/2014 0525   BUN 54* 03/06/2014 0525   BUN 22* 01/11/2014   CREATININE 3.73* 03/06/2014 0525   CREATININE 1.5* 01/11/2014   CALCIUM 8.4 03/06/2014 0525   GFRNONAA 11* 03/06/2014 0525   GFRAA 13* 03/06/2014 0525   CBC    Component Value Date/Time   WBC 13.4* 03/05/2014 0508   RBC 3.29* 03/05/2014 0508   HGB 8.9* 03/05/2014 0508   HCT 28.0* 03/05/2014 0508   PLT 273 03/05/2014 0508   MCV 85.1 03/05/2014 0508   MCH 27.1  03/05/2014 0508   MCHC 31.8 03/05/2014 0508   RDW 15.5 03/05/2014 0508   LYMPHSABS 2.1 03/05/2014 0508   MONOABS 0.9 03/05/2014 0508   EOSABS 0.8* 03/05/2014 0508   BASOSABS 0.0 03/05/2014 8841    Assessment/Plan: [name] 74 y.o. 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 ; admitted 03/01/2014 for hyperkalemia.   1. AKI - is improving Scr 3.7 with baseline Scr 1.2. Appears to have good UOP. Would continue to watch her output and monitor daily Scr.  2. UTI w/ sepsis on ABX  3. Acute encephalopathy: at baseline mentaly per NH nurse  4. Hyperkalemia: resolved  5. DM2  6. HTN 7. Hx TIA 8. Chronic diastolic CHF   [name] 9:57 AM I have seen and examined this patient and agree with plan per Dr Raeford Razor.  Renal fx cont to improve.  Neg fluid balance yest.  She is taking ensure and minimal po fluids.  Not sure what the end point is as this will undoubtedly be a recurring problem.  I think palliative care should meet with pt and family to set some goals so recurrent admissions can be avoided.   Andrea Kemp T,MD 03/06/2014 10:05 AM

## 2014-03-06 NOTE — Progress Notes (Signed)
Patient refused to eat all her meals; says she wants to lose weight.  She only likes to eat chocolate ensure pudding and she ate for all meals.

## 2014-03-07 LAB — CBC
HCT: 30.7 % — ABNORMAL LOW (ref 36.0–46.0)
Hemoglobin: 9.8 g/dL — ABNORMAL LOW (ref 12.0–15.0)
MCH: 27.2 pg (ref 26.0–34.0)
MCHC: 31.9 g/dL (ref 30.0–36.0)
MCV: 85.3 fL (ref 78.0–100.0)
Platelets: 263 10*3/uL (ref 150–400)
RBC: 3.6 MIL/uL — ABNORMAL LOW (ref 3.87–5.11)
RDW: 15.5 % (ref 11.5–15.5)
WBC: 13.1 10*3/uL — ABNORMAL HIGH (ref 4.0–10.5)

## 2014-03-07 LAB — BASIC METABOLIC PANEL
Anion gap: 10 (ref 5–15)
BUN: 54 mg/dL — ABNORMAL HIGH (ref 6–23)
CALCIUM: 8.6 mg/dL (ref 8.4–10.5)
CO2: 22 mmol/L (ref 19–32)
Chloride: 108 mmol/L (ref 96–112)
Creatinine, Ser: 3.63 mg/dL — ABNORMAL HIGH (ref 0.50–1.10)
GFR calc Af Amer: 13 mL/min — ABNORMAL LOW (ref >90)
GFR, EST NON AFRICAN AMERICAN: 12 mL/min — AB (ref >90)
Glucose, Bld: 94 mg/dL (ref 70–99)
Potassium: 4.1 mmol/L (ref 3.5–5.1)
Sodium: 140 mmol/L (ref 135–145)

## 2014-03-07 LAB — GLUCOSE, CAPILLARY
GLUCOSE-CAPILLARY: 104 mg/dL — AB (ref 70–99)
GLUCOSE-CAPILLARY: 118 mg/dL — AB (ref 70–99)
GLUCOSE-CAPILLARY: 144 mg/dL — AB (ref 70–99)
Glucose-Capillary: 81 mg/dL (ref 70–99)
Glucose-Capillary: 93 mg/dL (ref 70–99)

## 2014-03-07 LAB — BRAIN NATRIURETIC PEPTIDE: B Natriuretic Peptide: 1522.4 pg/mL — ABNORMAL HIGH (ref 0.0–100.0)

## 2014-03-07 NOTE — Progress Notes (Signed)
S: Unable to get much in the way of helpful verbal responses O:BP 141/65 mmHg  Pulse 57  Temp(Src) 97.8 F (36.6 C) (Axillary)  Resp 16  Wt 83.462 kg (184 lb)  SpO2 95%  Intake/Output Summary (Last 24 hours) at 03/07/14 0954 Last data filed at 03/07/14 0700  Gross per 24 hour  Intake    770 ml  Output   1201 ml  Net   -431 ml   Weight change: -3.084 kg (-6 lb 12.8 oz) MVH:QIONGEXB CVS:RRR Resp:decreased BS bases Abd:+ BS NTND Ext:no edema NEURO: She really dose not engage in anything or participate   . antiseptic oral rinse  7 mL Mouth Rinse BID  . aspirin  300 mg Rectal Daily   Or  . aspirin EC  325 mg Oral Daily  . collagenase   Topical Daily  . feeding supplement (ENSURE)  1 Container Oral BID BM  . heparin  5,000 Units Subcutaneous 3 times per day  . insulin aspart  0-9 Units Subcutaneous 6 times per day  . metoprolol tartrate  12.5 mg Oral BID  . piperacillin-tazobactam (ZOSYN)  IV  2.25 g Intravenous 3 times per day  . protein supplement  1 scoop Oral TID WC  . timolol  1 drop Left Eye BID   No results found. BMET    Component Value Date/Time   NA 140 03/07/2014 0615   NA 140 01/11/2014   K 4.1 03/07/2014 0615   CL 108 03/07/2014 0615   CO2 22 03/07/2014 0615   GLUCOSE 94 03/07/2014 0615   BUN 54* 03/07/2014 0615   BUN 22* 01/11/2014   CREATININE 3.63* 03/07/2014 0615   CREATININE 1.5* 01/11/2014   CALCIUM 8.6 03/07/2014 0615   GFRNONAA 12* 03/07/2014 0615   GFRAA 13* 03/07/2014 0615   CBC    Component Value Date/Time   WBC 13.1* 03/07/2014 0615   RBC 3.60* 03/07/2014 0615   HGB 9.8* 03/07/2014 0615   HCT 30.7* 03/07/2014 0615   PLT 263 03/07/2014 0615   MCV 85.3 03/07/2014 0615   MCH 27.2 03/07/2014 0615   MCHC 31.9 03/07/2014 0615   RDW 15.5 03/07/2014 0615   LYMPHSABS 2.1 03/05/2014 0508   MONOABS 0.9 03/05/2014 0508   EOSABS 0.8* 03/05/2014 0508   BASOSABS 0.0 03/05/2014 0508     Assessment: 1. Acute on CKD, improving.  Scr  trending down 2. Probable UTI 3. DM  Plan: 1. See my note from yest 2. Cont IV fluids 3. Recheck labs in AM  Veroncia Jezek T

## 2014-03-07 NOTE — Progress Notes (Signed)
[facility] PROGRESS NOTE  [name] WTU:[id number] DOB: 10-18-1940 DOA: 03/01/2014 PCP: Gildardo Cranker, DO  Assessment/Plan: 1. Acute encephalopathy 1. Asleep when seen, arousable and conversant but won't open eyes 2. Suspect related to UTI with sepsis with acute renal failure below 2. UTI with sepsis 1. On initial urine cx, multiple organisms on urine culture 2. Remains on empiric zosyn 3. Repeat urine cx without growth 4. Leukocytosis improving. Afebrile 5. If stable in the AM, would consider transitioning to PO abx 3. ARF 1. Cr is now showing improvement 2. Patient is continued on gentle IVF as tolerated 3. Appreciate Nephro input 4. Attempted to contact legal guardian regarding this case, as pt is noted to have decreased appetite on the floor, concerning for high risk for recurrence of renal failure. 4. DM2 1. Glucose remains stable 2. Cont on SSI coverage 5. HTN 1. bp remains stable and controlled 2. Cont current regimen 6. Hx TIA 1. stable 7. Hyperkalemia 1. Corrected and normal today 2. Follow lytes and correct as needed 8. Chronic diastolic CHF 1. Last documented BNP from 11/15 noted to be 4645, repeat this admit of around 1500 2. Some pitting edema noted on LE 3. CXR with vascular congestion, but repeat cxr stable 4. Patient remains on min O2 support 5. Cont IVF for the time being secondary to above as pt would benefit more from hydration 9. DVT prophylaxis 1. Heparin subQ 10. Wide complex arrhythmia 1. Noted briefly on 2/5 2. Had discussed and reviewed rhythm strip with Cardiology 3. Possible brief period of afib noted followed by short burst of vtach 4. Follow up EKG with sinus rhythm 5. Lytes this AM unremarkable 6. Discussed with Cardiology with recommendation for low dose beta blocker and continue cardiac monitoring 7. Thus far stable with mild asymptomatic bradycaria  Code Status: Full Family Communication: Pt in room, attempted to contact  legal guardian however there was no answer - will try again later Disposition Plan: Pending   Consultants:  None  Procedures:    Antibiotics:  Zosyn 2/2>>> (indicate start date, and stop date if known)  HPI/Subjective: Pt is without complaints. No acute events  Objective: Filed Vitals:   03/06/14 2105 03/07/14 0430 03/07/14 0822 03/07/14 1052  BP: 119/42 141/65  151/55  Pulse: 51 57  63  Temp: 98.4 F (36.9 C) 97.8 F (36.6 C)  97.6 F (36.4 C)  TempSrc: Axillary Axillary  Axillary  Resp: 16 16  18   Height:   5\' 2"  (1.575 m)   Weight: 83.462 kg (184 lb)     SpO2: 95% 95%  94%    Intake/Output Summary (Last 24 hours) at 03/07/14 1758 Last data filed at 03/07/14 1421  Gross per 24 hour  Intake    890 ml  Output   1201 ml  Net   -311 ml   Filed Weights   03/04/14 1958 03/05/14 2005 03/06/14 2105  Weight: 88.86 kg (195 lb 14.4 oz) 86.546 kg (190 lb 12.8 oz) 83.462 kg (184 lb)    Exam:   General:  Awake in bed, in nad  Cardiovascular: regular,s 1, s2  Respiratory: normal resp effort, no wheezing  Abdomen: soft, nondistended  Musculoskeletal: perfused, no clubbing   Data Reviewed: Basic Metabolic Panel:  Recent Labs Lab 03/03/14 0630 03/03/14 2003 03/04/14 0700 03/05/14 0508 03/06/14 0525 03/07/14 0615  NA 139  --  136 135 136 140  K 4.6  --  4.0 4.0 4.3 4.1  CL 103  --  103  103 104 108  CO2 25  --  19 21 20 22   GLUCOSE 162*  --  159* 114* 110* 94  BUN 63*  --  61* 55* 54* 54*  CREATININE 4.54*  --  4.24* 4.04* 3.73* 3.63*  CALCIUM 8.6  --  8.1* 8.0* 8.4 8.6  MG  --  1.9  --   --   --   --    Liver Function Tests:  Recent Labs Lab 03/02/14 0645  AST 17  ALT 11  ALKPHOS 120*  BILITOT 0.7  PROT 7.1  ALBUMIN 2.5*   No results for input(s): LIPASE, AMYLASE in the last 168 hours. No results for input(s): AMMONIA in the last 168 hours. CBC:  Recent Labs Lab 03/01/14 1728 03/02/14 0645 03/03/14 0630 03/05/14 0508 03/07/14 0615   WBC 16.0* 16.2* 15.4* 13.4* 13.1*  NEUTROABS 14.2*  --   --  9.7*  --   HGB 10.1* 9.6* 9.9* 8.9* 9.8*  HCT 30.9* 30.2* 30.0* 28.0* 30.7*  MCV 85.4 87.0 85.0 85.1 85.3  PLT 311 313 273 273 263   Cardiac Enzymes:  Recent Labs Lab 03/05/14 1405 03/05/14 1940 03/06/14 0125  TROPONINI 0.04* 0.07* 0.05*   BNP (last 3 results)  Recent Labs  03/07/14 0615  BNP 1522.4*    ProBNP (last 3 results)  Recent Labs  12/26/13 1640 12/27/13 0040  PROBNP 5360.0* 4645.0*    CBG:  Recent Labs Lab 03/06/14 2327 03/07/14 0412 03/07/14 0754 03/07/14 1257 03/07/14 1625  GLUCAP 155* 81 104* 93 118*    Recent Results (from the past 240 hour(s))  Urine culture     Status: None   Collection Time: 03/01/14  6:10 PM  Result Value Ref Range Status   Specimen Description URINE, CATHETERIZED  Final   Special Requests NONE  Final   Colony Count   Final    >=100,000 COLONIES/ML Performed at [facility]    Culture   Final    Multiple bacterial morphotypes present, none predominant. Suggest appropriate recollection if clinically indicated. Performed at [facility]    Report Status 03/02/2014 FINAL  Final  MRSA PCR Screening     Status: None   Collection Time: 03/01/14 11:55 PM  Result Value Ref Range Status   MRSA by PCR NEGATIVE NEGATIVE Final    Comment:        The GeneXpert MRSA Assay (FDA approved for NASAL specimens only), is one component of a comprehensive MRSA colonization surveillance program. It is not intended to diagnose MRSA infection nor to guide or monitor treatment for MRSA infections.   Culture, blood (routine x 2)     Status: None (Preliminary result)   Collection Time: 03/02/14  9:45 AM  Result Value Ref Range Status   Specimen Description BLOOD RIGHT HAND  Final   Special Requests BOTTLES DRAWN AEROBIC AND ANAEROBIC 10CC  Final   Culture   Final           BLOOD CULTURE RECEIVED NO GROWTH TO DATE CULTURE WILL BE HELD FOR 5 DAYS BEFORE  ISSUING A FINAL NEGATIVE REPORT Performed at [facility]    Report Status PENDING  Incomplete  Culture, blood (routine x 2)     Status: None (Preliminary result)   Collection Time: 03/02/14  9:58 AM  Result Value Ref Range Status   Specimen Description BLOOD LEFT HAND  Final   Special Requests BOTTLES DRAWN AEROBIC AND ANAEROBIC 10CC  Final   Culture   Final  BLOOD CULTURE RECEIVED NO GROWTH TO DATE CULTURE WILL BE HELD FOR 5 DAYS BEFORE ISSUING A FINAL NEGATIVE REPORT Performed at [facility]    Report Status PENDING  Incomplete  Culture, Urine     Status: None   Collection Time: 03/03/14 10:54 AM  Result Value Ref Range Status   Specimen Description URINE, CLEAN CATCH  Final   Special Requests NONE  Final   Colony Count NO GROWTH Performed at [facility]   Final   Culture NO GROWTH Performed at [facility]   Final   Report Status 03/04/2014 FINAL  Final     Studies: No results found.  Scheduled Meds: . antiseptic oral rinse  7 mL Mouth Rinse BID  . aspirin  300 mg Rectal Daily   Or  . aspirin EC  325 mg Oral Daily  . collagenase   Topical Daily  . feeding supplement (ENSURE)  1 Container Oral BID BM  . heparin  5,000 Units Subcutaneous 3 times per day  . insulin aspart  0-9 Units Subcutaneous 6 times per day  . metoprolol tartrate  12.5 mg Oral BID  . piperacillin-tazobactam (ZOSYN)  IV  2.25 g Intravenous 3 times per day  . protein supplement  1 scoop Oral TID WC  . timolol  1 drop Left Eye BID   Continuous Infusions: . sodium chloride 50 mL/hr (03/06/14 1852)    Principal Problem:   Acute encephalopathy Active Problems:   Type 2 diabetes, controlled, with peripheral circulatory disorder   Essential hypertension   TIA (transient ischemic attack)   UTI (lower urinary tract infection)   Acute on chronic renal failure   Hyperkalemia   Chronic diastolic CHF (congestive heart failure)   Mckynlie Vanderslice, [place]  [facility] Pager [phone]. If 7PM-7AM, please contact night-coverage at [email or web address], password [facility] 03/07/2014, 5:58 PM  LOS: 6 days

## 2014-03-08 LAB — CBC
HEMATOCRIT: 28.5 % — AB (ref 36.0–46.0)
Hemoglobin: 9.2 g/dL — ABNORMAL LOW (ref 12.0–15.0)
MCH: 28 pg (ref 26.0–34.0)
MCHC: 32.3 g/dL (ref 30.0–36.0)
MCV: 86.9 fL (ref 78.0–100.0)
PLATELETS: 250 10*3/uL (ref 150–400)
RBC: 3.28 MIL/uL — ABNORMAL LOW (ref 3.87–5.11)
RDW: 15.5 % (ref 11.5–15.5)
WBC: 10.9 10*3/uL — AB (ref 4.0–10.5)

## 2014-03-08 LAB — CULTURE, BLOOD (ROUTINE X 2)
Culture: NO GROWTH
Culture: NO GROWTH

## 2014-03-08 LAB — RENAL FUNCTION PANEL
ALBUMIN: 2.1 g/dL — AB (ref 3.5–5.2)
Anion gap: 11 (ref 5–15)
BUN: 47 mg/dL — AB (ref 6–23)
CHLORIDE: 109 mmol/L (ref 96–112)
CO2: 20 mmol/L (ref 19–32)
CREATININE: 3.29 mg/dL — AB (ref 0.50–1.10)
Calcium: 8.4 mg/dL (ref 8.4–10.5)
GFR calc Af Amer: 15 mL/min — ABNORMAL LOW (ref >90)
GFR, EST NON AFRICAN AMERICAN: 13 mL/min — AB (ref >90)
Glucose, Bld: 110 mg/dL — ABNORMAL HIGH (ref 70–99)
PHOSPHORUS: 4.3 mg/dL (ref 2.3–4.6)
Potassium: 3.9 mmol/L (ref 3.5–5.1)
SODIUM: 140 mmol/L (ref 135–145)

## 2014-03-08 LAB — GLUCOSE, CAPILLARY
Glucose-Capillary: 125 mg/dL — ABNORMAL HIGH (ref 70–99)
Glucose-Capillary: 150 mg/dL — ABNORMAL HIGH (ref 70–99)
Glucose-Capillary: 94 mg/dL (ref 70–99)

## 2014-03-08 MED ORDER — CEFUROXIME AXETIL 250 MG PO TABS
250.0000 mg | ORAL_TABLET | Freq: Two times a day (BID) | ORAL | Status: DC
  Administered 2014-03-09: 250 mg via ORAL
  Filled 2014-03-08 (×3): qty 1

## 2014-03-08 NOTE — Progress Notes (Addendum)
[facility] PROGRESS NOTE  [name] PZW:[id number] DOB: 1941/01/07 DOA: 03/01/2014 PCP: Gildardo Cranker, DO  Assessment/Plan: 1. Acute encephalopathy 1. Seems to persistently, remain with eyes closed, ultimately conversant but doesn't open eyes 2. Suspect related to UTI with sepsis with acute renal failure below 2. UTI with sepsis 1. On initial urine cx, multiple organisms on urine culture 2. Remains on empiric zosyn 3. Repeat urine cx without growth 4. Leukocytosis improving. Afebrile 5. Will transition to PO ceftin 3. ARF 1. Patient remains on gentle IVF as tolerated 2. Appreciate Nephro input 3. Cr improved slightly more today 4. Have again, attempted to contact legal guardian regarding this case, without success. As pt is noted to have decreased appetite on the floor, there is high risk for recurrence of dehydration and pre-renal failure. ?Palliative consult? Will first discuss with legal guardian before requesting consult 5. Legal guardian listed as Janece Canterbury with social services 4. DM2 1. Glucose remains stable 2. Cont on SSI coverage 5. HTN 1. bp remains stable and controlled 2. Cont current regimen 6. Hx TIA 1. stable 7. Hyperkalemia 1. Corrected and normal 2. Follow lytes and correct as needed 8. Chronic diastolic CHF 1. Last documented BNP from 11/15 noted to be 4645, repeat this admit of around 1500 2. Some pitting edema noted on LE 3. CXR with vascular congestion, but repeat cxr stable 4. Patient remains on min O2 support 5. Cont IVF for the time being secondary to above as pt would benefit more from hydration 9. DVT prophylaxis 1. Heparin subQ 10. Wide complex arrhythmia 1. Noted briefly on 2/5 2. Had earlier discussed and reviewed rhythm strip with Cardiology 3. Possible brief period of afib noted followed by short burst of vtach 4. Follow up EKG with sinus rhythm 5. Lytes remain unremarkable 6. Cont with Cardiology recommendations for low  dose beta blocker and continue cardiac monitoring 7. Thus far stable with mild asymptomatic bradycaria  Code Status: Full Family Communication: Pt in room, attempted to contact legal guardian however there was no answer Disposition Plan: Pending   Consultants:  None  Procedures:    Antibiotics:  Zosyn 2/2>>>2/8 (indicate start date, and stop date if known)  Ceftin 2/8>>>  HPI/Subjective: Pt denies sob or cp. No acute events noted  Objective: Filed Vitals:   03/07/14 2100 03/08/14 0434 03/08/14 0900 03/08/14 1754  BP: 127/59 152/71 127/44 140/73  Pulse: 62 69 59 66  Temp: 97.7 F (36.5 C) 97.7 F (36.5 C) 97.6 F (36.4 C) 97.7 F (36.5 C)  TempSrc: Oral Axillary Axillary Axillary  Resp: 18 16 18 18   Height:      Weight: 84.6 kg (186 lb 8.2 oz)     SpO2: 95% 100% 100%     Intake/Output Summary (Last 24 hours) at 03/08/14 1840 Last data filed at 03/08/14 1109  Gross per 24 hour  Intake   1010 ml  Output      0 ml  Net   1010 ml   Filed Weights   03/05/14 2005 03/06/14 2105 03/07/14 2100  Weight: 86.546 kg (190 lb 12.8 oz) 83.462 kg (184 lb) 84.6 kg (186 lb 8.2 oz)    Exam:   General:  Asleep, easily arousable and conversnat, in nad  Cardiovascular: regular,s 1, s2  Respiratory: normal resp effort, no wheezing  Abdomen: soft, nondistended  Musculoskeletal: perfused, no clubbing   Data Reviewed: Basic Metabolic Panel:  Recent Labs Lab 03/03/14 2003 03/04/14 0700 03/05/14 0508 03/06/14 0525 03/07/14 0615 03/08/14 0500  NA  --  136 135 136 140 140  K  --  4.0 4.0 4.3 4.1 3.9  CL  --  103 103 104 108 109  CO2  --  19 21 20 22 20   GLUCOSE  --  159* 114* 110* 94 110*  BUN  --  61* 55* 54* 54* 47*  CREATININE  --  4.24* 4.04* 3.73* 3.63* 3.29*  CALCIUM  --  8.1* 8.0* 8.4 8.6 8.4  MG 1.9  --   --   --   --   --   PHOS  --   --   --   --   --  4.3   Liver Function Tests:  Recent Labs Lab 03/02/14 0645 03/08/14 0500  AST 17  --   ALT  11  --   ALKPHOS 120*  --   BILITOT 0.7  --   PROT 7.1  --   ALBUMIN 2.5* 2.1*   No results for input(s): LIPASE, AMYLASE in the last 168 hours. No results for input(s): AMMONIA in the last 168 hours. CBC:  Recent Labs Lab 03/02/14 0645 03/03/14 0630 03/05/14 0508 03/07/14 0615 03/08/14 0500  WBC 16.2* 15.4* 13.4* 13.1* 10.9*  NEUTROABS  --   --  9.7*  --   --   HGB 9.6* 9.9* 8.9* 9.8* 9.2*  HCT 30.2* 30.0* 28.0* 30.7* 28.5*  MCV 87.0 85.0 85.1 85.3 86.9  PLT 313 273 273 263 250   Cardiac Enzymes:  Recent Labs Lab 03/05/14 1405 03/05/14 1940 03/06/14 0125  TROPONINI 0.04* 0.07* 0.05*   BNP (last 3 results)  Recent Labs  03/07/14 0615  BNP 1522.4*    ProBNP (last 3 results)  Recent Labs  12/26/13 1640 12/27/13 0040  PROBNP 5360.0* 4645.0*    CBG:  Recent Labs Lab 03/07/14 1625 03/07/14 2011 03/07/14 2356 03/08/14 0429 03/08/14 0738  GLUCAP 118* 144* 150* 125* 94    Recent Results (from the past 240 hour(s))  Urine culture     Status: None   Collection Time: 03/01/14  6:10 PM  Result Value Ref Range Status   Specimen Description URINE, CATHETERIZED  Final   Special Requests NONE  Final   Colony Count   Final    >=100,000 COLONIES/ML Performed at [facility]    Culture   Final    Multiple bacterial morphotypes present, none predominant. Suggest appropriate recollection if clinically indicated. Performed at [facility]    Report Status 03/02/2014 FINAL  Final  MRSA PCR Screening     Status: None   Collection Time: 03/01/14 11:55 PM  Result Value Ref Range Status   MRSA by PCR NEGATIVE NEGATIVE Final    Comment:        The GeneXpert MRSA Assay (FDA approved for NASAL specimens only), is one component of a comprehensive MRSA colonization surveillance program. It is not intended to diagnose MRSA infection nor to guide or monitor treatment for MRSA infections.   Culture, blood (routine x 2)     Status: None    Collection Time: 03/02/14  9:45 AM  Result Value Ref Range Status   Specimen Description BLOOD RIGHT HAND  Final   Special Requests BOTTLES DRAWN AEROBIC AND ANAEROBIC 10CC  Final   Culture   Final    NO GROWTH 5 DAYS Performed at [facility]    Report Status 03/08/2014 FINAL  Final  Culture, blood (routine x 2)     Status: None   Collection  Time: 03/02/14  9:58 AM  Result Value Ref Range Status   Specimen Description BLOOD LEFT HAND  Final   Special Requests BOTTLES DRAWN AEROBIC AND ANAEROBIC 10CC  Final   Culture   Final    NO GROWTH 5 DAYS Performed at [facility]    Report Status 03/08/2014 FINAL  Final  Culture, Urine     Status: None   Collection Time: 03/03/14 10:54 AM  Result Value Ref Range Status   Specimen Description URINE, CLEAN CATCH  Final   Special Requests NONE  Final   Colony Count NO GROWTH Performed at [facility]   Final   Culture NO GROWTH Performed at [facility]   Final   Report Status 03/04/2014 FINAL  Final     Studies: No results found.  Scheduled Meds: . antiseptic oral rinse  7 mL Mouth Rinse BID  . aspirin  300 mg Rectal Daily   Or  . aspirin EC  325 mg Oral Daily  . collagenase   Topical Daily  . feeding supplement (ENSURE)  1 Container Oral BID BM  . heparin  5,000 Units Subcutaneous 3 times per day  . insulin aspart  0-9 Units Subcutaneous 6 times per day  . metoprolol tartrate  12.5 mg Oral BID  . piperacillin-tazobactam (ZOSYN)  IV  2.25 g Intravenous 3 times per day  . protein supplement  1 scoop Oral TID WC  . timolol  1 drop Left Eye BID   Continuous Infusions: . sodium chloride 50 mL/hr at 03/07/14 2017    Principal Problem:   Acute encephalopathy Active Problems:   Type 2 diabetes, controlled, with peripheral circulatory disorder   Essential hypertension   TIA (transient ischemic attack)   UTI (lower urinary tract infection)   Acute on chronic renal failure   Hyperkalemia    Chronic diastolic CHF (congestive heart failure)   Mercades Bajaj, [place] [facility] Pager [phone]. If 7PM-7AM, please contact night-coverage at [email or web address], password [facility] 03/08/2014, 6:40 PM  LOS: 7 days

## 2014-03-08 NOTE — Progress Notes (Signed)
Patient ID: [name], female    DOB: [birth year], 74 y.o.   MRN: [id number] Nephrology Progress Note  S:Sleeping in bed. Doesn't feel much better since coming into hospital   O:BP 152/71 mmHg  Pulse 69  Temp(Src) 97.7 F (36.5 C) (Axillary)  Resp 16  Ht 5\' 2"  (1.575 m)  Wt 186 lb 8.2 oz (84.6 kg)  BMI 34.10 kg/m2  SpO2 100%  Intake/Output Summary (Last 24 hours) at 03/08/14 0838 Last data filed at 03/08/14 0700  Gross per 24 hour  Intake   1370 ml  Output    600 ml  Net    770 ml   Intake/Output: I/O last 3 completed shifts: In: 2140 [P.O.:840; I.V.:1200; IV Piggyback:100] Out: 1801 [Urine:1800; Stool:1]  Intake/Output this shift:    Weight change: 2 lb 8.2 oz (1.138 kg) Gen: Sleeping in bed but arouses to stimuli  CVS:S1S2, RRR Resp:CTAB IRW:ERXV, NTND, +BS  QMG:QQPY, soft boot on right foot.    Recent Labs Lab 03/01/14 1728 03/02/14 0110 03/02/14 0645 03/03/14 0630 03/04/14 0700 03/05/14 0508 03/06/14 0525 03/07/14 0615  NA 137  --  141 139 136 135 136 140  K 5.6* 5.0 5.4* 4.6 4.0 4.0 4.3 4.1  CL 104  --  107 103 103 103 104 108  CO2 20  --  22 25 19 21 20 22   GLUCOSE 102*  --  94 162* 159* 114* 110* 94  BUN 70*  --  65* 63* 61* 55* 54* 54*  CREATININE 4.97*  --  4.77* 4.54* 4.24* 4.04* 3.73* 3.63*  ALBUMIN  --   --  2.5*  --   --   --   --   --   CALCIUM 9.0  --  8.8 8.6 8.1* 8.0* 8.4 8.6  AST  --   --  17  --   --   --   --   --   ALT  --   --  11  --   --   --   --   --    Liver Function Tests:  Recent Labs Lab 03/02/14 0645  AST 17  ALT 11  ALKPHOS 120*  BILITOT 0.7  PROT 7.1  ALBUMIN 2.5*   No results for input(s): LIPASE, AMYLASE in the last 168 hours. No results for input(s): AMMONIA in the last 168 hours. CBC:  Recent Labs Lab 03/01/14 1728 03/02/14 0645 03/03/14 0630 03/05/14 0508 03/07/14 0615  WBC 16.0* 16.2* 15.4* 13.4* 13.1*  NEUTROABS 14.2*  --   --  9.7*  --   HGB 10.1* 9.6* 9.9* 8.9* 9.8*  HCT 30.9* 30.2* 30.0*  28.0* 30.7*  MCV 85.4 87.0 85.0 85.1 85.3  PLT 311 313 273 273 263   Cardiac Enzymes:  Recent Labs Lab 03/05/14 1405 03/05/14 1940 03/06/14 0125  TROPONINI 0.04* 0.07* 0.05*   CBG:  Recent Labs Lab 03/07/14 1625 03/07/14 2011 03/07/14 2356 03/08/14 0429 03/08/14 0738  GLUCAP 118* 144* 150* 125* 94    Iron Studies:  Recent Labs  03/05/14 1940  IRON 69  TIBC 143*  FERRITIN 182   Studies/Results: No results found. Marland Kitchen antiseptic oral rinse  7 mL Mouth Rinse BID  . aspirin  300 mg Rectal Daily   Or  . aspirin EC  325 mg Oral Daily  . collagenase   Topical Daily  . feeding supplement (ENSURE)  1 Container Oral BID BM  . heparin  5,000 Units Subcutaneous 3 times per day  . insulin  aspart  0-9 Units Subcutaneous 6 times per day  . metoprolol tartrate  12.5 mg Oral BID  . piperacillin-tazobactam (ZOSYN)  IV  2.25 g Intravenous 3 times per day  . protein supplement  1 scoop Oral TID WC  . timolol  1 drop Left Eye BID    BMET    Component Value Date/Time   NA 140 03/07/2014 0615   NA 140 01/11/2014   K 4.1 03/07/2014 0615   CL 108 03/07/2014 0615   CO2 22 03/07/2014 0615   GLUCOSE 94 03/07/2014 0615   BUN 54* 03/07/2014 0615   BUN 22* 01/11/2014   CREATININE 3.63* 03/07/2014 0615   CREATININE 1.5* 01/11/2014   CALCIUM 8.6 03/07/2014 0615   GFRNONAA 12* 03/07/2014 0615   GFRAA 13* 03/07/2014 0615   CBC    Component Value Date/Time   WBC 13.1* 03/07/2014 0615   RBC 3.60* 03/07/2014 0615   HGB 9.8* 03/07/2014 0615   HCT 30.7* 03/07/2014 0615   PLT 263 03/07/2014 0615   MCV 85.3 03/07/2014 0615   MCH 27.2 03/07/2014 0615   MCHC 31.9 03/07/2014 0615   RDW 15.5 03/07/2014 0615   LYMPHSABS 2.1 03/05/2014 0508   MONOABS 0.9 03/05/2014 0508   EOSABS 0.8* 03/05/2014 0508   BASOSABS 0.0 03/05/2014 9622    Assessment/Plan: [name] 74 y.o. 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   1. AKI - slowly improving Scr 3.63 with baseline Scr 1.2. Poor PO intake and high likelihood of recurrence of renal failure.  2. UTI w/ sepsis: on Abx  3. Acute encephalopathy: at baseline  4. Hyperkalemia: resolved  5. DM2 6. HTN  7. Hx TIA  8. Chronic diastolic CHF    Rosemarie Ax 8:38 AM

## 2014-03-08 NOTE — Progress Notes (Signed)
ANTIBIOTIC CONSULT NOTE - FOLLOW UP  Pharmacy Consult for zosyn Indication: UTI  No Known Allergies  Patient Measurements: Height: 5\' 2"  (157.5 cm) Weight: 186 lb 8.2 oz (84.6 kg) IBW/kg (Calculated) : 50.1  Vital Signs: Temp: 97.6 F (36.4 C) (02/08 0900) Temp Source: Axillary (02/08 0900) BP: 127/44 mmHg (02/08 0900) Pulse Rate: 59 (02/08 0900) Intake/Output from previous day: 02/07 0701 - 02/08 0700 In: 1370 [P.O.:720; I.V.:600; IV Piggyback:50] Out: 600 [Urine:600] Intake/Output from this shift:    Labs:  Recent Labs  03/06/14 0525 03/07/14 0615  WBC  --  13.1*  HGB  --  9.8*  PLT  --  263  CREATININE 3.73* 3.63*   Estimated Creatinine Clearance: 14.3 mL/min (by C-G formula based on Cr of 3.63). No results for input(s): VANCOTROUGH, VANCOPEAK, VANCORANDOM, GENTTROUGH, GENTPEAK, GENTRANDOM, TOBRATROUGH, TOBRAPEAK, TOBRARND, AMIKACINPEAK, AMIKACINTROU, AMIKACIN in the last 72 hours.   Microbiology: Recent Results (from the past 720 hour(s))  Urine culture     Status: None   Collection Time: 03/01/14  6:10 PM  Result Value Ref Range Status   Specimen Description URINE, CATHETERIZED  Final   Special Requests NONE  Final   Colony Count   Final    >=100,000 COLONIES/ML Performed at [facility]    Culture   Final    Multiple bacterial morphotypes present, none predominant. Suggest appropriate recollection if clinically indicated. Performed at [facility]    Report Status 03/02/2014 FINAL  Final  MRSA PCR Screening     Status: None   Collection Time: 03/01/14 11:55 PM  Result Value Ref Range Status   MRSA by PCR NEGATIVE NEGATIVE Final    Comment:        The GeneXpert MRSA Assay (FDA approved for NASAL specimens only), is one component of a comprehensive MRSA colonization surveillance program. It is not intended to diagnose MRSA infection nor to guide or monitor treatment for MRSA infections.   Culture, blood (routine x 2)      Status: None   Collection Time: 03/02/14  9:45 AM  Result Value Ref Range Status   Specimen Description BLOOD RIGHT HAND  Final   Special Requests BOTTLES DRAWN AEROBIC AND ANAEROBIC 10CC  Final   Culture   Final    NO GROWTH 5 DAYS Performed at [facility]    Report Status 03/08/2014 FINAL  Final  Culture, blood (routine x 2)     Status: None   Collection Time: 03/02/14  9:58 AM  Result Value Ref Range Status   Specimen Description BLOOD LEFT HAND  Final   Special Requests BOTTLES DRAWN AEROBIC AND ANAEROBIC 10CC  Final   Culture   Final    NO GROWTH 5 DAYS Performed at [facility]    Report Status 03/08/2014 FINAL  Final  Culture, Urine     Status: None   Collection Time: 03/03/14 10:54 AM  Result Value Ref Range Status   Specimen Description URINE, CLEAN CATCH  Final   Special Requests NONE  Final   Colony Count NO GROWTH Performed at [facility]   Final   Culture NO GROWTH Performed at [facility]   Final   Report Status 03/04/2014 FINAL  Final    Anti-infectives    Start     Dose/Rate Route Frequency Ordered Stop   03/02/14 2200  piperacillin-tazobactam (ZOSYN) IVPB 2.25 g     2.25 g100 mL/hr over 30 Minutes Intravenous 3 times per day 03/02/14 1636  03/02/14 0600  piperacillin-tazobactam (ZOSYN) IVPB 3.375 g     3.375 g12.5 mL/hr over 240 Minutes Intravenous 3 times per day 03/01/14 2114 03/02/14 1732   03/01/14 2200  piperacillin-tazobactam (ZOSYN) IVPB 3.375 g     3.375 g12.5 mL/hr over 240 Minutes Intravenous  Once 03/01/14 2113 03/02/14 0312   03/01/14 1930  cefTRIAXone (ROCEPHIN) 1 g in dextrose 5 % 50 mL IVPB     1 g100 mL/hr over 30 Minutes Intravenous  Once 03/01/14 1915 03/01/14 2054      Assessment: Patient is a 74 y.o F with AKI and hx ESBL Klebsiella UTI  currently on zosyn day #8 for suspected sepsis secondary to UTI.  Scr continues to trend down with 3.63 yesterday (est crcl~ 14). She remains afebrile.  CTX x  1 in ED on 2/1 pm Zosyn 2/1>> Santyl daily (not sure to where) 2/2>>  2/1 urine - > 100K/ml, multiple species 2/2 blood x 2 - neg FINAL 2/1 MRSA screen neg 2/3 urine - neg FINAL   Plan:  - Continue Zosyn 2.25 gm IV q8hrs - Monitor renal fxn for any need to adjust - [place] CBC, cultures and s/s of clinical improvement   Blimi Godby P 03/08/2014,1:52 PM

## 2014-03-08 NOTE — Care Management Note (Signed)
CARE MANAGEMENT NOTE 03/08/2014  Patient:  [name], [name]   Account Number:  [id number]  Date Initiated:  03/05/2014  Documentation initiated by:  Alok Minshall  Subjective/Objective Assessment:   CM following for progression and d/c planning. This pt is a resident of [place], where she is "total Care" . This pt is not ambulatory or mobil.     Action/Plan:   following for d/c needs.   Anticipated DC Date:  03/10/2014   Anticipated DC Plan:  SKILLED NURSING FACILITY         Choice offered to / List presented to:             Status of service:  In process, will continue to follow Medicare Important Message given?  YES (If response is "NO", the following Medicare IM given date fields will be blank) Date Medicare IM given:  03/05/2014 Medicare IM given by:  Ryleah Miramontes Date Additional Medicare IM given:  03/08/2014 Additional Medicare IM given by:  [facility]  Discharge Disposition:    Per UR Regulation:    If discussed at Long Length of Stay Meetings, dates discussed:    Comments:

## 2014-03-09 LAB — GLUCOSE, CAPILLARY
Glucose-Capillary: 72 mg/dL (ref 70–99)
Glucose-Capillary: 82 mg/dL (ref 70–99)
Glucose-Capillary: 77 mg/dL (ref 70–99)
Glucose-Capillary: 98 mg/dL (ref 70–99)
Glucose-Capillary: 109 mg/dL — ABNORMAL HIGH (ref 70–99)
Glucose-Capillary: 102 mg/dL — ABNORMAL HIGH (ref 70–99)

## 2014-03-09 LAB — BASIC METABOLIC PANEL
Anion gap: 11 (ref 5–15)
BUN: 44 mg/dL — ABNORMAL HIGH (ref 6–23)
CO2: 21 mmol/L (ref 19–32)
Calcium: 8.6 mg/dL (ref 8.4–10.5)
Chloride: 108 mmol/L (ref 96–112)
Creatinine, Ser: 3.19 mg/dL — ABNORMAL HIGH (ref 0.50–1.10)
GFR calc Af Amer: 16 mL/min — ABNORMAL LOW (ref >90)
GFR calc non Af Amer: 14 mL/min — ABNORMAL LOW (ref >90)
Glucose, Bld: 112 mg/dL — ABNORMAL HIGH (ref 70–99)
Potassium: 3.9 mmol/L (ref 3.5–5.1)
Sodium: 140 mmol/L (ref 135–145)

## 2014-03-09 LAB — CBC
HCT: 30.5 % — ABNORMAL LOW (ref 36.0–46.0)
Hemoglobin: 9.6 g/dL — ABNORMAL LOW (ref 12.0–15.0)
MCH: 27.6 pg (ref 26.0–34.0)
MCHC: 31.5 g/dL (ref 30.0–36.0)
MCV: 87.6 fL (ref 78.0–100.0)
Platelets: 245 10*3/uL (ref 150–400)
RBC: 3.48 MIL/uL — ABNORMAL LOW (ref 3.87–5.11)
RDW: 15.8 % — ABNORMAL HIGH (ref 11.5–15.5)
WBC: 11.4 10*3/uL — ABNORMAL HIGH (ref 4.0–10.5)

## 2014-03-09 NOTE — Progress Notes (Addendum)
[facility] PROGRESS NOTE  [name]:[id number] DOB: 1940-05-24 DOA: 03/01/2014 PCP: Gildardo Cranker, DO  Assessment/Plan: 1. Acute encephalopathy 1. Seems to persistently, remain with eyes closed, ultimately conversant but doesn't open eyes 2. Suspect related to UTI with sepsis with acute renal failure below 2. UTI with sepsis 1. On initial urine cx, multiple organisms on urine culture 2. Remains on empiric zosyn 3. Repeat urine cx without growth 4. Leukocytosis improving. Afebrile 5. Will transition to PO ceftin 3. ARF 1. Patient remains on gentle IVF as tolerated 2. Appreciate Nephro input 3. Cr improved slightly more today 4. Discussed case with legal guardian, [name], at number listed. Andrea shares our concerns that patient has a high chance for recurrent acute renal failure in light of very poor PO intake and is agreeable to Palliative Care consult for goals of care. Have since consulted Palliative Care. 4. DM2 1. Glucose remains stable 2. Cont on SSI coverage 5. HTN 1. bp remains stable and controlled 2. Cont current regimen 6. Hx TIA 1. stable 7. Hyperkalemia 1. Corrected and normal 2. Follow lytes and correct as needed 8. Chronic diastolic CHF 1. Last documented BNP from 11/15 noted to be 4645, repeat this admit of around 1500 2. Some pitting edema noted on LE 3. CXR with vascular congestion, but repeat cxr stable 4. Patient remains on min O2 support 5. Cont IVF for the time being secondary to above as pt would benefit more from hydration  9. DVT prophylaxis 1. Heparin subQ 10. Wide complex arrhythmia 1. Noted briefly on 2/5 2. Had earlier discussed and reviewed rhythm strip with Cardiology 3. Possible brief period of afib noted followed by short burst of vtach 4. Follow up EKG with sinus rhythm 5. Lytes remain unremarkable 6. Cont with Cardiology recommendations for low dose beta blocker and continue cardiac monitoring 7. Thus far stable with  mild asymptomatic bradycaria  Code Status: Full Family Communication: Pt in room, Discussed case with Legal Guardian, [name] at number listed Disposition Plan: Pending   Consultants:  Nephrology  Palliative Care  Procedures:    Antibiotics:  Zosyn 2/2>>>2/8 (indicate start date, and stop date if known)  Ceftin 2/8>>>2/9  HPI/Subjective: Pt is without acute events noted overnight. Pt claims to be feeling better today.  Objective: Filed Vitals:   03/08/14 1754 03/08/14 2035 03/09/14 0441 03/09/14 0953  BP: 140/73 150/63 141/56 121/46  Pulse: 66 57 89 80  Temp: 97.7 F (36.5 C) 97.5 F (36.4 C) 98.2 F (36.8 C) 97.7 F (36.5 C)  TempSrc: Axillary Axillary Oral Axillary  Resp: 18 18 16 16   Height:      Weight:  86.1 kg (189 lb 13.1 oz)    SpO2:  92% 97% 97%    Intake/Output Summary (Last 24 hours) at 03/09/14 1612 Last data filed at 03/09/14 1448  Gross per 24 hour  Intake    780 ml  Output      0 ml  Net    780 ml   Filed Weights   03/06/14 2105 03/07/14 2100 03/08/14 2035  Weight: 83.462 kg (184 lb) 84.6 kg (186 lb 8.2 oz) 86.1 kg (189 lb 13.1 oz)    Exam:   General:  Asleep, arousable and conversnat, in nad  Cardiovascular: regular,s 1, s2  Respiratory: normal resp effort, no wheezing  Abdomen: soft, nondistended  Musculoskeletal: perfused, no clubbing   Data Reviewed: Basic Metabolic Panel:  Recent Labs Lab 03/03/14 2003  03/05/14 8119 03/06/14 0525 03/07/14 1478  03/08/14 0500 03/09/14 1110  NA  --   < > 135 136 140 140 140  K  --   < > 4.0 4.3 4.1 3.9 3.9  CL  --   < > 103 104 108 109 108  CO2  --   < > 21 20 22 20 21   GLUCOSE  --   < > 114* 110* 94 110* 112*  BUN  --   < > 55* 54* 54* 47* 44*  CREATININE  --   < > 4.04* 3.73* 3.63* 3.29* 3.19*  CALCIUM  --   < > 8.0* 8.4 8.6 8.4 8.6  MG 1.9  --   --   --   --   --   --   PHOS  --   --   --   --   --  4.3  --   < > = values in this interval not displayed. Liver Function  Tests:  Recent Labs Lab 03/08/14 0500  ALBUMIN 2.1*   No results for input(s): LIPASE, AMYLASE in the last 168 hours. No results for input(s): AMMONIA in the last 168 hours. CBC:  Recent Labs Lab 03/03/14 0630 03/05/14 0508 03/07/14 0615 03/08/14 0500 03/09/14 1110  WBC 15.4* 13.4* 13.1* 10.9* 11.4*  NEUTROABS  --  9.7*  --   --   --   HGB 9.9* 8.9* 9.8* 9.2* 9.6*  HCT 30.0* 28.0* 30.7* 28.5* 30.5*  MCV 85.0 85.1 85.3 86.9 87.6  PLT 273 273 263 250 245   Cardiac Enzymes:  Recent Labs Lab 03/05/14 1405 03/05/14 1940 03/06/14 0125  TROPONINI 0.04* 0.07* 0.05*   BNP (last 3 results)  Recent Labs  03/07/14 0615  BNP 1522.4*    ProBNP (last 3 results)  Recent Labs  12/26/13 1640 12/27/13 0040  PROBNP 5360.0* 4645.0*    CBG:  Recent Labs Lab 03/07/14 2011 03/07/14 2356 03/08/14 0429 03/08/14 0738 03/09/14 1135  GLUCAP 144* 150* 125* 94 109*    Recent Results (from the past 240 hour(s))  Urine culture     Status: None   Collection Time: 03/01/14  6:10 PM  Result Value Ref Range Status   Specimen Description URINE, CATHETERIZED  Final   Special Requests NONE  Final   Colony Count   Final    >=100,000 COLONIES/ML Performed at [facility]    Culture   Final    Multiple bacterial morphotypes present, none predominant. Suggest appropriate recollection if clinically indicated. Performed at [facility]    Report Status 03/02/2014 FINAL  Final  MRSA PCR Screening     Status: None   Collection Time: 03/01/14 11:55 PM  Result Value Ref Range Status   MRSA by PCR NEGATIVE NEGATIVE Final    Comment:        The GeneXpert MRSA Assay (FDA approved for NASAL specimens only), is one component of a comprehensive MRSA colonization surveillance program. It is not intended to diagnose MRSA infection nor to guide or monitor treatment for MRSA infections.   Culture, blood (routine x 2)     Status: None   Collection Time: 03/02/14  9:45  AM  Result Value Ref Range Status   Specimen Description BLOOD RIGHT HAND  Final   Special Requests BOTTLES DRAWN AEROBIC AND ANAEROBIC 10CC  Final   Culture   Final    NO GROWTH 5 DAYS Performed at [facility]    Report Status 03/08/2014 FINAL  Final  Culture, blood (routine x  2)     Status: None   Collection Time: 03/02/14  9:58 AM  Result Value Ref Range Status   Specimen Description BLOOD LEFT HAND  Final   Special Requests BOTTLES DRAWN AEROBIC AND ANAEROBIC 10CC  Final   Culture   Final    NO GROWTH 5 DAYS Performed at [facility]    Report Status 03/08/2014 FINAL  Final  Culture, Urine     Status: None   Collection Time: 03/03/14 10:54 AM  Result Value Ref Range Status   Specimen Description URINE, CLEAN CATCH  Final   Special Requests NONE  Final   Colony Count NO GROWTH Performed at [facility]   Final   Culture NO GROWTH Performed at [facility]   Final   Report Status 03/04/2014 FINAL  Final     Studies: No results found.  Scheduled Meds: . antiseptic oral rinse  7 mL Mouth Rinse BID  . aspirin  300 mg Rectal Daily   Or  . aspirin EC  325 mg Oral Daily  . cefUROXime  250 mg Oral BID WC  . collagenase   Topical Daily  . feeding supplement (ENSURE)  1 Container Oral BID BM  . heparin  5,000 Units Subcutaneous 3 times per day  . insulin aspart  0-9 Units Subcutaneous 6 times per day  . metoprolol tartrate  12.5 mg Oral BID  . protein supplement  1 scoop Oral TID WC  . timolol  1 drop Left Eye BID   Continuous Infusions: . sodium chloride 50 mL/hr at 03/09/14 1416    Principal Problem:   Acute encephalopathy Active Problems:   Type 2 diabetes, controlled, with peripheral circulatory disorder   Essential hypertension   TIA (transient ischemic attack)   UTI (lower urinary tract infection)   Acute on chronic renal failure   Hyperkalemia   Chronic diastolic CHF (congestive heart failure)   Clorinda Wyble, [place] [facility] Pager [phone]. If 7PM-7AM, please contact night-coverage at [email or web address], password [facility] 03/09/2014, 4:12 PM  LOS: 8 days

## 2014-03-09 NOTE — Progress Notes (Signed)
Patient ID: [name], female    DOB: 07-03-1940, 74 y.o.   MRN: [id number] Nephrology Progress Note  S: lying in bed. Doesn't open eyes.   O:BP 141/56 mmHg  Pulse 89  Temp(Src) 98.2 F (36.8 C) (Oral)  Resp 16  Ht 5\' 2"  (1.575 m)  Wt 189 lb 13.1 oz (86.1 kg)  BMI 34.71 kg/m2  SpO2 97%  Intake/Output Summary (Last 24 hours) at 03/09/14 0929 Last data filed at 03/08/14 2301  Gross per 24 hour  Intake    660 ml  Output      0 ml  Net    660 ml   Intake/Output: I/O last 3 completed shifts: In: 7322 [P.O.:1020; I.V.:600; IV Piggyback:50] Out: -   Intake/Output this shift:    Weight change: 3 lb 4.9 oz (1.5 kg) Gen: Lying in bed.  CVS:S1S2, RRR Resp:CTAB GUR:KYHC, NTND, +BS  WCB:JSEG, soft boot on right foot.    Recent Labs Lab 03/03/14 0630 03/04/14 0700 03/05/14 0508 03/06/14 0525 03/07/14 0615 03/08/14 0500  NA 139 136 135 136 140 140  K 4.6 4.0 4.0 4.3 4.1 3.9  CL 103 103 103 104 108 109  CO2 25 19 21 20 22 20   GLUCOSE 162* 159* 114* 110* 94 110*  BUN 63* 61* 55* 54* 54* 47*  CREATININE 4.54* 4.24* 4.04* 3.73* 3.63* 3.29*  ALBUMIN  --   --   --   --   --  2.1*  CALCIUM 8.6 8.1* 8.0* 8.4 8.6 8.4  PHOS  --   --   --   --   --  4.3   Liver Function Tests:  Recent Labs Lab 03/08/14 0500  ALBUMIN 2.1*   No results for input(s): LIPASE, AMYLASE in the last 168 hours. No results for input(s): AMMONIA in the last 168 hours. CBC:  Recent Labs Lab 03/03/14 0630 03/05/14 0508 03/07/14 0615 03/08/14 0500  WBC 15.4* 13.4* 13.1* 10.9*  NEUTROABS  --  9.7*  --   --   HGB 9.9* 8.9* 9.8* 9.2*  HCT 30.0* 28.0* 30.7* 28.5*  MCV 85.0 85.1 85.3 86.9  PLT 273 273 263 250   Cardiac Enzymes:  Recent Labs Lab 03/05/14 1405 03/05/14 1940 03/06/14 0125  TROPONINI 0.04* 0.07* 0.05*   CBG:  Recent Labs Lab 03/07/14 1625 03/07/14 2011 03/07/14 2356 03/08/14 0429 03/08/14 0738  GLUCAP 118* 144* 150* 125* 94    Iron Studies: No results for  input(s): IRON, TIBC, TRANSFERRIN, FERRITIN in the last 72 hours. Studies/Results: No results found. Marland Kitchen antiseptic oral rinse  7 mL Mouth Rinse BID  . aspirin  300 mg Rectal Daily   Or  . aspirin EC  325 mg Oral Daily  . cefUROXime  250 mg Oral BID WC  . collagenase   Topical Daily  . feeding supplement (ENSURE)  1 Container Oral BID BM  . heparin  5,000 Units Subcutaneous 3 times per day  . insulin aspart  0-9 Units Subcutaneous 6 times per day  . metoprolol tartrate  12.5 mg Oral BID  . protein supplement  1 scoop Oral TID WC  . timolol  1 drop Left Eye BID    BMET    Component Value Date/Time   NA 140 03/08/2014 0500   NA 140 01/11/2014   K 3.9 03/08/2014 0500   CL 109 03/08/2014 0500   CO2 20 03/08/2014 0500   GLUCOSE 110* 03/08/2014 0500   BUN 47* 03/08/2014 0500   BUN 22* 01/11/2014  CREATININE 3.29* 03/08/2014 0500   CREATININE 1.5* 01/11/2014   CALCIUM 8.4 03/08/2014 0500   GFRNONAA 13* 03/08/2014 0500   GFRAA 15* 03/08/2014 0500   CBC    Component Value Date/Time   WBC 10.9* 03/08/2014 0500   RBC 3.28* 03/08/2014 0500   HGB 9.2* 03/08/2014 0500   HCT 28.5* 03/08/2014 0500   PLT 250 03/08/2014 0500   MCV 86.9 03/08/2014 0500   MCH 28.0 03/08/2014 0500   MCHC 32.3 03/08/2014 0500   RDW 15.5 03/08/2014 0500   LYMPHSABS 2.1 03/05/2014 0508   MONOABS 0.9 03/05/2014 0508   EOSABS 0.8* 03/05/2014 0508   BASOSABS 0.0 03/05/2014 8315    Assessment/Plan: [name] 74 y.o. 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   1. AKI - baseline Scr 1.2. Making urine but still with poor PO intake and high likelihood of recurrence of renal failure.  2. UTI w/ sepsis: on Abx  3. Acute encephalopathy: at baseline  4. Hyperkalemia: resolved  5. DM2 6. HTN  7. Hx TIA  8. Chronic diastolic CHF    Rosemarie Ax 9:29 AM

## 2014-03-09 NOTE — Progress Notes (Signed)
Physical Therapy Treatment Patient Details Name: [name] MRN: [id number] DOB: 06/16/1940 Today's Date: 03/09/2014    History of Present Illness 74 yo female who is long term care resident has been admitted for acute encephalopathy related to UTI     PT Comments    Pt was able to perform transfers to/from EOB with +2 total assist. Pt was not willing to attempt OOB, therapeutic exercise, or sitting balance activity during session. Demonstrated the ability to sit unsupported for short bouts of time, requiring occasional assist for sudden posterior lean. Pt is appropriate for OOB with a lift with nursing. Will continue to follow.   Follow Up Recommendations  SNF;Supervision/Assistance - 24 hour     Equipment Recommendations  None recommended by PT    Recommendations for Other Services       Precautions / Restrictions Precautions Precautions: Fall Restrictions Weight Bearing Restrictions: No    Mobility  Bed Mobility Overal bed mobility: Needs Assistance Bed Mobility: Rolling;Supine to Sit;Sit to Supine Rolling: Total assist   Supine to sit: Total assist;+2 for physical assistance Sit to supine: Total assist;+2 for physical assistance   General bed mobility comments: Pt lethargic and required +2 assist for all mobility and transitions to/from EOB.   Transfers                    Ambulation/Gait                 Stairs            Wheelchair Mobility    Modified Rankin (Stroke Patients Only)       Balance Overall balance assessment: Needs assistance Sitting-balance support: Bilateral upper extremity supported Sitting balance-Leahy Scale: Poor Sitting balance - Comments: Pt was able to maintain sitting EOB unsupported for short bouts, however requires assist as pt with posterior lean.                             Cognition Arousal/Alertness: Lethargic Behavior During Therapy: Flat affect Overall Cognitive Status: Difficult to  assess                      Exercises      General Comments General comments (skin integrity, edema, etc.): Pt began more verbal when sitting up. Did not appear to be happy about being at EOB. Wants therapist to leave her alone.       Pertinent Vitals/Pain Pain Assessment: No/denies pain    Home Living                      Prior Function            PT Goals (current goals can now be found in the care plan section) Acute Rehab PT Goals Patient Stated Goal: None stated PT Goal Formulation: Patient unable to participate in goal setting Time For Goal Achievement: 03/19/14 Potential to Achieve Goals: Fair Progress towards PT goals: Progressing toward goals    Frequency  Min 2X/week    PT Plan Current plan remains appropriate    Co-evaluation             End of Session Equipment Utilized During Treatment: Oxygen Activity Tolerance: Patient limited by lethargy Patient left: in bed;with call bell/phone within reach     Time: 1024-1038 PT Time Calculation (min) (ACUTE ONLY): 14 min  Charges:  $Therapeutic Activity: 8-22 mins  G Codes:      Rolinda Roan 03/09/2014, 10:56 AM  Rolinda Roan, PT, DPT Acute Rehabilitation Services Pager: [phone]

## 2014-03-10 LAB — BASIC METABOLIC PANEL
Anion gap: 13 (ref 5–15)
BUN: 37 mg/dL — AB (ref 6–23)
CALCIUM: 8.5 mg/dL (ref 8.4–10.5)
CHLORIDE: 110 mmol/L (ref 96–112)
CO2: 17 mmol/L — ABNORMAL LOW (ref 19–32)
CREATININE: 3 mg/dL — AB (ref 0.50–1.10)
GFR calc Af Amer: 17 mL/min — ABNORMAL LOW (ref >90)
GFR calc non Af Amer: 15 mL/min — ABNORMAL LOW (ref >90)
Glucose, Bld: 130 mg/dL — ABNORMAL HIGH (ref 70–99)
Potassium: 3.8 mmol/L (ref 3.5–5.1)
Sodium: 140 mmol/L (ref 135–145)

## 2014-03-10 LAB — GLUCOSE, CAPILLARY
GLUCOSE-CAPILLARY: 84 mg/dL (ref 70–99)
GLUCOSE-CAPILLARY: 100 mg/dL — AB (ref 70–99)
GLUCOSE-CAPILLARY: 66 mg/dL — AB (ref 70–99)
GLUCOSE-CAPILLARY: 94 mg/dL (ref 70–99)
Glucose-Capillary: 99 mg/dL (ref 70–99)
Glucose-Capillary: 121 mg/dL — ABNORMAL HIGH (ref 70–99)
Glucose-Capillary: 141 mg/dL — ABNORMAL HIGH (ref 70–99)
Glucose-Capillary: 120 mg/dL — ABNORMAL HIGH (ref 70–99)
Glucose-Capillary: 94 mg/dL (ref 70–99)
Glucose-Capillary: 176 mg/dL — ABNORMAL HIGH (ref 70–99)

## 2014-03-10 NOTE — Progress Notes (Signed)
PATIENT DETAILS Name: [name] Age: 74 y.o. Sex: female Date of Birth: Dec 22, 1940 Admit Date: 03/01/2014 Admitting Physician Allyne Gee, MD [id number], Brayton Layman, DO  Subjective: Seems to be much more awake and alert this morning. Followed commands, answer some of my questions appropriately.  Assessment/Plan: Principal Problem:   Acute encephalopathy: Likely toxic metabolic encephalopathy secondary to renal failure and presumed UTI. Encephalopathy seems to have significantly improved. CT head without any acute abnormalities.  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 Appreciate nephrology recommendations, continue gentle hydration and encourage oral intake. Palliative care following, as patient has left-likely would've recurrence of renal failure given failure to thrive syndrome.    Type 2 diabetes, controlled, with peripheral circulatory disorder: CBGs stable, continue SSI    Essential hypertension: BP stable, continue metoprolol.    UTI (lower urinary tract infection): Has complete a course of antibiotics. Urine culture negative.    Hyperkalemia: Resolved.    Chronic diastolic CHF (congestive heart failure): Compensated, watch closely while on IV fluids  Disposition: Remain inpatient  Antibiotics: See below  Anti-infectives    Start     Dose/Rate Route Frequency Ordered Stop   03/09/14 0800  cefUROXime (CEFTIN) tablet 250 mg  Status:  Discontinued     250 mg Oral 2 times daily with meals 03/08/14 1851 03/09/14 1911   03/02/14 2200  piperacillin-tazobactam (ZOSYN) IVPB 2.25 g  Status:  Discontinued     2.25 g 100 mL/hr over 30 Minutes Intravenous 3 times per day 03/02/14 1636 03/08/14 1851   03/02/14 0600  piperacillin-tazobactam (ZOSYN) IVPB 3.375 g     3.375 g 12.5 mL/hr over 240 Minutes Intravenous 3 times per day 03/01/14 2114 03/02/14 1732   03/01/14 2200  piperacillin-tazobactam  (ZOSYN) IVPB 3.375 g     3.375 g 12.5 mL/hr over 240 Minutes Intravenous  Once 03/01/14 2113 03/02/14 0312   03/01/14 1930  cefTRIAXone (ROCEPHIN) 1 g in dextrose 5 % 50 mL IVPB     1 g 100 mL/hr over 30 Minutes Intravenous  Once 03/01/14 1915 03/01/14 2054      DVT Prophylaxis: Prophylactic Heparin   Code Status: Full code   Family Communication None at bedside  Procedures:  None  CONSULTS:  nephrology and Palliative care   MEDICATIONS: Scheduled Meds: . antiseptic oral rinse  7 mL Mouth Rinse BID  . aspirin  300 mg Rectal Daily   Or  . aspirin EC  325 mg Oral Daily  . collagenase   Topical Daily  . feeding supplement (ENSURE)  1 Container Oral BID BM  . heparin  5,000 Units Subcutaneous 3 times per day  . insulin aspart  0-9 Units Subcutaneous 6 times per day  . metoprolol tartrate  12.5 mg Oral BID  . protein supplement  1 scoop Oral TID WC  . timolol  1 drop Left Eye BID   Continuous Infusions: . sodium chloride 50 mL/hr at 03/09/14 1416   PRN Meds:.hydrocortisone, ipratropium-albuterol, ondansetron **OR** ondansetron (ZOFRAN) IV, sodium chloride    PHYSICAL EXAM: Vital signs in last 24 hours: Filed Vitals:   03/09/14 1650 03/09/14 2038 03/10/14 0440 03/10/14 1000  BP: 150/70 156/69 150/56 166/85  Pulse: 72 70 87 62  Temp: 97.8 F (36.6 C) 98.2 F (36.8 C) 97.8 F (36.6 C) 97.7 F (36.5 C)  TempSrc: Axillary Axillary Oral Oral  Resp: 17 16 16  18  Height:      Weight:  87.2 kg (192 lb 3.9 oz)    SpO2: 94% 100% 100% 100%    Weight change: 1.1 kg (2 lb 6.8 oz) Filed Weights   03/07/14 2100 03/08/14 2035 03/09/14 2038  Weight: 84.6 kg (186 lb 8.2 oz) 86.1 kg (189 lb 13.1 oz) 87.2 kg (192 lb 3.9 oz)   Body mass index is 35.15 kg/(m^2).   Gen Exam: Awake and alert with clear speech.  Neck: Supple, No JVD.   Chest: B/L Clear.   CVS: S1 S2 Regular, no murmurs.  Abdomen: soft, BS +, non tender, non distended.  Extremities: no edema, lower  extremities warm to touch. Neurologic: Non Focal.   Skin: No Rash.   Wounds: N/A.   Intake/Output from previous day:  Intake/Output Summary (Last 24 hours) at 03/10/14 1817 Last data filed at 03/10/14 0900  Gross per 24 hour  Intake   2410 ml  Output      0 ml  Net   2410 ml     LAB RESULTS: CBC  Recent Labs Lab 03/05/14 0508 03/07/14 0615 03/08/14 0500 03/09/14 1110  WBC 13.4* 13.1* 10.9* 11.4*  HGB 8.9* 9.8* 9.2* 9.6*  HCT 28.0* 30.7* 28.5* 30.5*  PLT 273 263 250 245  MCV 85.1 85.3 86.9 87.6  MCH 27.1 27.2 28.0 27.6  MCHC 31.8 31.9 32.3 31.5  RDW 15.5 15.5 15.5 15.8*  LYMPHSABS 2.1  --   --   --   MONOABS 0.9  --   --   --   EOSABS 0.8*  --   --   --   BASOSABS 0.0  --   --   --     Chemistries   Recent Labs Lab 03/03/14 2003  03/06/14 0525 03/07/14 0615 03/08/14 0500 03/09/14 1110 03/10/14 1120  NA  --   < > 136 140 140 140 140  K  --   < > 4.3 4.1 3.9 3.9 3.8  CL  --   < > 104 108 109 108 110  CO2  --   < > 20 22 20 21  17*  GLUCOSE  --   < > 110* 94 110* 112* 130*  BUN  --   < > 54* 54* 47* 44* 37*  CREATININE  --   < > 3.73* 3.63* 3.29* 3.19* 3.00*  CALCIUM  --   < > 8.4 8.6 8.4 8.6 8.5  MG 1.9  --   --   --   --   --   --   < > = values in this interval not displayed.  CBG:  Recent Labs Lab 03/10/14 0407 03/10/14 0751 03/10/14 0831 03/10/14 1147 03/10/14 1701  GLUCAP 100* 66* 94 121* 141*    GFR Estimated Creatinine Clearance: 17.6 mL/min (by C-G formula based on Cr of 3).  Coagulation profile No results for input(s): INR, PROTIME in the last 168 hours.  Cardiac Enzymes  Recent Labs Lab 03/05/14 1405 03/05/14 1940 03/06/14 0125  TROPONINI 0.04* 0.07* 0.05*    Invalid input(s): POCBNP No results for input(s): DDIMER in the last 72 hours. No results for input(s): HGBA1C in the last 72 hours. No results for input(s): CHOL, HDL, LDLCALC, TRIG, CHOLHDL, LDLDIRECT in the last 72 hours. No results for input(s): TSH, T4TOTAL,  T3FREE, THYROIDAB in the last 72 hours.  Invalid input(s): FREET3 No results for input(s): VITAMINB12, FOLATE, FERRITIN, TIBC, IRON, RETICCTPCT in the last 72 hours. No results for input(s): LIPASE,  AMYLASE in the last 72 hours.  Urine Studies No results for input(s): UHGB, CRYS in the last 72 hours.  Invalid input(s): UACOL, UAPR, USPG, UPH, UTP, UGL, UKET, UBIL, UNIT, UROB, ULEU, UEPI, UWBC, URBC, UBAC, CAST, UCOM, BILUA  MICROBIOLOGY: Recent Results (from the past 240 hour(s))  Urine culture     Status: None   Collection Time: 03/01/14  6:10 PM  Result Value Ref Range Status   Specimen Description URINE, CATHETERIZED  Final   Special Requests NONE  Final   Colony Count   Final    >=100,000 COLONIES/ML Performed at [facility]    Culture   Final    Multiple bacterial morphotypes present, none predominant. Suggest appropriate recollection if clinically indicated. Performed at [facility]    Report Status 03/02/2014 FINAL  Final  MRSA PCR Screening     Status: None   Collection Time: 03/01/14 11:55 PM  Result Value Ref Range Status   MRSA by PCR NEGATIVE NEGATIVE Final    Comment:        The GeneXpert MRSA Assay (FDA approved for NASAL specimens only), is one component of a comprehensive MRSA colonization surveillance program. It is not intended to diagnose MRSA infection nor to guide or monitor treatment for MRSA infections.   Culture, blood (routine x 2)     Status: None   Collection Time: 03/02/14  9:45 AM  Result Value Ref Range Status   Specimen Description BLOOD RIGHT HAND  Final   Special Requests BOTTLES DRAWN AEROBIC AND ANAEROBIC 10CC  Final   Culture   Final    NO GROWTH 5 DAYS Performed at [facility]    Report Status 03/08/2014 FINAL  Final  Culture, blood (routine x 2)     Status: None   Collection Time: 03/02/14  9:58 AM  Result Value Ref Range Status   Specimen Description BLOOD LEFT HAND  Final   Special Requests  BOTTLES DRAWN AEROBIC AND ANAEROBIC 10CC  Final   Culture   Final    NO GROWTH 5 DAYS Performed at [facility]    Report Status 03/08/2014 FINAL  Final  Culture, Urine     Status: None   Collection Time: 03/03/14 10:54 AM  Result Value Ref Range Status   Specimen Description URINE, CLEAN CATCH  Final   Special Requests NONE  Final   Colony Count NO GROWTH Performed at [facility]   Final   Culture NO GROWTH Performed at [facility]   Final   Report Status 03/04/2014 FINAL  Final    RADIOLOGY STUDIES/RESULTS: Dg Chest 2 View  03/01/2014   CLINICAL DATA:  High potassium. History of diabetes and heart failure.  EXAM: CHEST  2 VIEW  COMPARISON:  01/01/2014  FINDINGS: Mild cardiomegaly and vascular congestion. Low lung volumes. No confluent opacities, effusions or edema. No acute bony abnormality.  IMPRESSION: Cardiomegaly, vascular congestion.   Electronically Signed   By: Rolm Baptise M.D.   On: 03/01/2014 19:12   Ct Head Wo Contrast  03/02/2014   CLINICAL DATA:  Acute encephalopathy with increased lethargy  EXAM: CT HEAD WITHOUT CONTRAST  TECHNIQUE: Contiguous axial images were obtained from the base of the skull through the vertex without intravenous contrast.  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 The overall size and contour of the ventricles remain stable compared to prior study. The sulci are not appreciably enlarged. There is no intracranial  mass, hemorrhage, extra-axial fluid collection, or midline shift. There is evidence of a prior infarct in the superior anterior right frontal lobe. There is patchy small vessel disease in the centra semiovale bilaterally, stable. The bony calvarium appears intact. There is minimal opacification of posterior mastoid air cells inferiorly on the right. Most of the mastoids on the right are clear. There is diffuse opacification of left mastoid air cells. Extensive  calcification is noted in the right globe, stable. Calcification along the retina on the left is stable.  IMPRESSION: Generalized hydrocephalus remains stable without focal site of obstruction. Sulci normal in size. 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 No acute hemorrhage, mass, or edema. No acute appearing infarct. Diffuse opacification left mastoid air cells, stable. Minimal opacification of several right-sided mastoid air cell cells, stable. Calcification in each globe, more severe on the right than on the left, again noted and stable.   Electronically Signed   By: Lowella Grip M.D.   On: 03/02/2014 10:36   US Renal  03/02/2014   CLINICAL DATA:  Acute renal failure.  EXAM: RENAL/URINARY TRACT ULTRASOUND COMPLETE  COMPARISON:  12/29/2013.  FINDINGS: Right Kidney:  Length: 10.8 cm. Echogenicity within normal limits. No mass or hydronephrosis visualized.  Left Kidney:  Left visualized due to overlying bowel gas.  Bladder:  No bladder distention.  IMPRESSION: 1. Right kidney appears unremarkable.  Bladder is nondistended. 2. Left kidney not visualized due to prominent overlying bowel gas.   Electronically Signed   By: Marcello Moores  Register   On: 03/02/2014 12:36   Dg Chest Port 1 View  03/05/2014   CLINICAL DATA:  Hypoxia.  EXAM: PORTABLE CHEST - 1 VIEW  COMPARISON:  March 01, 2014.  FINDINGS: Stable cardiomegaly. Stable mild central pulmonary vascular congestion is noted. No consolidative process is noted. No pneumothorax or pleural effusion is noted. Bony thorax is intact.  IMPRESSION: Stable cardiomegaly and central pulmonary vascular congestion is noted compared to prior exam.   Electronically Signed   By: Sabino Dick M.D.   On: 03/05/2014 08:25    Oren Binet, MD  [facility] Pager:[phone]  If 7PM-7AM, please contact night-coverage [email or web address] Password [facility] 03/10/2014, 6:17 PM   LOS: 8 days

## 2014-03-10 NOTE — Progress Notes (Signed)
I have spoken with [name] and we have scheduled a time for Friday 2/12 at 1000 am to discuss goals of care.  Vinie Sill, NP Palliative Medicine Team Pager # [phone] (M-F 8a-5p) Team Phone # [phone] (Nights/Weekends)

## 2014-03-10 NOTE — Progress Notes (Signed)
Hypoglycemic Event  CBG: 60 Treatment: 15 GM carbohydrate snack  Symptoms: None  Follow-up CBG: Time:0751  CBG Result:94  Possible Reasons for Event: Inadequate meal intake  Comments/MD notified: Shanker Augustine Radar, Nyrie Sigal  Remember to initiate Hypoglycemia Order Set & complete

## 2014-03-10 NOTE — Progress Notes (Signed)
I have left a message for [name] (noted to be her legal guardian) at [phone] requesting a call back to set up a time we can speak in person or via telephone to discuss goals of care and planning for the best way we can care for [name]. Thank you for this consult.   Vinie Sill, NP Palliative Medicine Team Pager # [phone] (M-F 8a-5p) Team Phone # [phone] (Nights/Weekends)

## 2014-03-11 LAB — GLUCOSE, CAPILLARY
GLUCOSE-CAPILLARY: 112 mg/dL — AB (ref 70–99)
GLUCOSE-CAPILLARY: 140 mg/dL — AB (ref 70–99)
Glucose-Capillary: 163 mg/dL — ABNORMAL HIGH (ref 70–99)
Glucose-Capillary: 139 mg/dL — ABNORMAL HIGH (ref 70–99)
Glucose-Capillary: 160 mg/dL — ABNORMAL HIGH (ref 70–99)
Glucose-Capillary: 134 mg/dL — ABNORMAL HIGH (ref 70–99)

## 2014-03-11 LAB — BASIC METABOLIC PANEL
Anion gap: 11 (ref 5–15)
BUN: 35 mg/dL — ABNORMAL HIGH (ref 6–23)
CALCIUM: 8.4 mg/dL (ref 8.4–10.5)
CO2: 17 mmol/L — AB (ref 19–32)
CREATININE: 2.8 mg/dL — AB (ref 0.50–1.10)
Chloride: 112 mmol/L (ref 96–112)
GFR calc non Af Amer: 16 mL/min — ABNORMAL LOW (ref >90)
GFR, EST AFRICAN AMERICAN: 18 mL/min — AB (ref >90)
Glucose, Bld: 132 mg/dL — ABNORMAL HIGH (ref 70–99)
Potassium: 3.9 mmol/L (ref 3.5–5.1)
SODIUM: 140 mmol/L (ref 135–145)

## 2014-03-11 MED ORDER — GLUCERNA SHAKE PO LIQD
237.0000 mL | Freq: Three times a day (TID) | ORAL | Status: DC
  Administered 2014-03-11 – 2014-03-12 (×2): 237 mL via ORAL

## 2014-03-11 NOTE — Progress Notes (Signed)
PATIENT DETAILS Name: [name] Age: 74 y.o. Sex: female Date of Birth: 1940/02/20 Admit Date: 03/01/2014 Admitting Physician Allyne Gee, MD [id number], Brayton Layman, DO  Subjective: Sleeping, doesn't want to talk when awoken. Per RN hardly any PO intake-refuses to eat.  Assessment/Plan: Principal Problem:   Acute encephalopathy: Likely toxic metabolic encephalopathy secondary to renal failure and presumed UTI. Encephalopathy seems to have significantly improved than the past few days but now has plataued. CT head without any acute abnormalities.  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 Appreciate nephrology recommendations, continue gentle hydration and encourage oral intake. Palliative care following, as patient has left-likely would hav recurrence of renal failure given failure to thrive syndrome and ongoing poor oral intake    Type 2 diabetes, controlled, with peripheral circulatory disorder: CBGs stable, continue SSI    Essential hypertension: BP stable, continue metoprolol.    UTI (lower urinary tract infection): Has complete a course of antibiotics. Urine culture negative.    Hyperkalemia: Resolved.    Chronic diastolic CHF (congestive heart failure): Compensated, watch closely while on IV fluids    Failure to thrive syndrome:poor oral intake, bed bound-multiple medical problems including blindness. Unfortunately seems like she continues to have extremely poor appetite-refuses to eat. Will likely continue to deteriorate. Have spoken with legal guardian today, and have recommended that we have goals of care discussion with palliative care medicine.  Disposition: Remain inpatient  Antibiotics: See below  Anti-infectives    Start     Dose/Rate Route Frequency Ordered Stop   03/09/14 0800  cefUROXime (CEFTIN) tablet 250 mg  Status:  Discontinued     250 mg Oral 2 times daily with meals 03/08/14  1851 03/09/14 1911   03/02/14 2200  piperacillin-tazobactam (ZOSYN) IVPB 2.25 g  Status:  Discontinued     2.25 g 100 mL/hr over 30 Minutes Intravenous 3 times per day 03/02/14 1636 03/08/14 1851   03/02/14 0600  piperacillin-tazobactam (ZOSYN) IVPB 3.375 g     3.375 g 12.5 mL/hr over 240 Minutes Intravenous 3 times per day 03/01/14 2114 03/02/14 1732   03/01/14 2200  piperacillin-tazobactam (ZOSYN) IVPB 3.375 g     3.375 g 12.5 mL/hr over 240 Minutes Intravenous  Once 03/01/14 2113 03/02/14 0312   03/01/14 1930  cefTRIAXone (ROCEPHIN) 1 g in dextrose 5 % 50 mL IVPB     1 g 100 mL/hr over 30 Minutes Intravenous  Once 03/01/14 1915 03/01/14 2054      DVT Prophylaxis: Prophylactic Heparin   Code Status: Full code   Family Communication Spoke with legal guardian over the phone 2/11  Procedures:  None  CONSULTS:  nephrology and Palliative care   MEDICATIONS: Scheduled Meds: . antiseptic oral rinse  7 mL Mouth Rinse BID  . aspirin  300 mg Rectal Daily   Or  . aspirin EC  325 mg Oral Daily  . collagenase   Topical Daily  . feeding supplement (ENSURE)  1 Container Oral BID BM  . heparin  5,000 Units Subcutaneous 3 times per day  . insulin aspart  0-9 Units Subcutaneous 6 times per day  . metoprolol tartrate  12.5 mg Oral BID  . protein supplement  1 scoop Oral TID WC  . timolol  1 drop Left Eye BID   Continuous Infusions: . sodium chloride 50 mL/hr at 03/09/14 1416   PRN Meds:.hydrocortisone, ipratropium-albuterol, ondansetron **OR** ondansetron (ZOFRAN)  IV, sodium chloride    PHYSICAL EXAM: Vital signs in last 24 hours: Filed Vitals:   03/10/14 1800 03/10/14 2040 03/11/14 0423 03/11/14 1000  BP: 148/74 134/46 151/56 157/66  Pulse: 66 74 54 45  Temp: 98 F (36.7 C) 98.2 F (36.8 C) 98.5 F (36.9 C) 98.2 F (36.8 C)  TempSrc: Oral Oral Axillary Oral  Resp: 18 16 20 18   Height:      Weight:  87.4 kg (192 lb 10.9 oz)    SpO2: 100% 100% 100% 100%    Weight  change: 0.2 kg (7.1 oz) Filed Weights   03/08/14 2035 03/09/14 2038 03/10/14 2040  Weight: 86.1 kg (189 lb 13.1 oz) 87.2 kg (192 lb 3.9 oz) 87.4 kg (192 lb 10.9 oz)   Body mass index is 35.23 kg/(m^2).   Gen Exam:weak, lethargic, but easily arousable Neck: Supple, No JVD.   Chest: B/L Clear.   CVS: S1 S2 Regular, no murmurs.  Abdomen: soft, BS +, non tender, non distended.  Extremities: no edema, lower extremities warm to touch. Neurologic: Non Focal.   Skin: No Rash.   Wounds: N/A.   Intake/Output from previous day:  Intake/Output Summary (Last 24 hours) at 03/11/14 1314 Last data filed at 03/11/14 0900  Gross per 24 hour  Intake    360 ml  Output      0 ml  Net    360 ml     LAB RESULTS: CBC  Recent Labs Lab 03/05/14 0508 03/07/14 0615 03/08/14 0500 03/09/14 1110  WBC 13.4* 13.1* 10.9* 11.4*  HGB 8.9* 9.8* 9.2* 9.6*  HCT 28.0* 30.7* 28.5* 30.5*  PLT 273 263 250 245  MCV 85.1 85.3 86.9 87.6  MCH 27.1 27.2 28.0 27.6  MCHC 31.8 31.9 32.3 31.5  RDW 15.5 15.5 15.5 15.8*  LYMPHSABS 2.1  --   --   --   MONOABS 0.9  --   --   --   EOSABS 0.8*  --   --   --   BASOSABS 0.0  --   --   --     Chemistries   Recent Labs Lab 03/07/14 0615 03/08/14 0500 03/09/14 1110 03/10/14 1120 03/11/14 0649  NA 140 140 140 140 140  K 4.1 3.9 3.9 3.8 3.9  CL 108 109 108 110 112  CO2 22 20 21  17* 17*  GLUCOSE 94 110* 112* 130* 132*  BUN 54* 47* 44* 37* 35*  CREATININE 3.63* 3.29* 3.19* 3.00* 2.80*  CALCIUM 8.6 8.4 8.6 8.5 8.4    CBG:  Recent Labs Lab 03/10/14 2044 03/11/14 0004 03/11/14 0429 03/11/14 0739 03/11/14 1203  GLUCAP 176* 163* 139* 112* 160*    GFR Estimated Creatinine Clearance: 18.9 mL/min (by C-G formula based on Cr of 2.8).  Coagulation profile No results for input(s): INR, PROTIME in the last 168 hours.  Cardiac Enzymes  Recent Labs Lab 03/05/14 1405 03/05/14 1940 03/06/14 0125  TROPONINI 0.04* 0.07* 0.05*    Invalid input(s):  POCBNP No results for input(s): DDIMER in the last 72 hours. No results for input(s): HGBA1C in the last 72 hours. No results for input(s): CHOL, HDL, LDLCALC, TRIG, CHOLHDL, LDLDIRECT in the last 72 hours. No results for input(s): TSH, T4TOTAL, T3FREE, THYROIDAB in the last 72 hours.  Invalid input(s): FREET3 No results for input(s): VITAMINB12, FOLATE, FERRITIN, TIBC, IRON, RETICCTPCT in the last 72 hours. No results for input(s): LIPASE, AMYLASE in the last 72 hours.  Urine Studies No results for input(s): UHGB,  CRYS in the last 72 hours.  Invalid input(s): UACOL, UAPR, USPG, UPH, UTP, UGL, UKET, UBIL, UNIT, UROB, ULEU, UEPI, UWBC, URBC, UBAC, CAST, UCOM, BILUA  MICROBIOLOGY: Recent Results (from the past 240 hour(s))  Urine culture     Status: None   Collection Time: 03/01/14  6:10 PM  Result Value Ref Range Status   Specimen Description URINE, CATHETERIZED  Final   Special Requests NONE  Final   Colony Count   Final    >=100,000 COLONIES/ML Performed at [facility]    Culture   Final    Multiple bacterial morphotypes present, none predominant. Suggest appropriate recollection if clinically indicated. Performed at [facility]    Report Status 03/02/2014 FINAL  Final  MRSA PCR Screening     Status: None   Collection Time: 03/01/14 11:55 PM  Result Value Ref Range Status   MRSA by PCR NEGATIVE NEGATIVE Final    Comment:        The GeneXpert MRSA Assay (FDA approved for NASAL specimens only), is one component of a comprehensive MRSA colonization surveillance program. It is not intended to diagnose MRSA infection nor to guide or monitor treatment for MRSA infections.   Culture, blood (routine x 2)     Status: None   Collection Time: 03/02/14  9:45 AM  Result Value Ref Range Status   Specimen Description BLOOD RIGHT HAND  Final   Special Requests BOTTLES DRAWN AEROBIC AND ANAEROBIC 10CC  Final   Culture   Final    NO GROWTH 5 DAYS Performed at  [facility]    Report Status 03/08/2014 FINAL  Final  Culture, blood (routine x 2)     Status: None   Collection Time: 03/02/14  9:58 AM  Result Value Ref Range Status   Specimen Description BLOOD LEFT HAND  Final   Special Requests BOTTLES DRAWN AEROBIC AND ANAEROBIC 10CC  Final   Culture   Final    NO GROWTH 5 DAYS Performed at [facility]    Report Status 03/08/2014 FINAL  Final  Culture, Urine     Status: None   Collection Time: 03/03/14 10:54 AM  Result Value Ref Range Status   Specimen Description URINE, CLEAN CATCH  Final   Special Requests NONE  Final   Colony Count NO GROWTH Performed at [facility]   Final   Culture NO GROWTH Performed at [facility]   Final   Report Status 03/04/2014 FINAL  Final    RADIOLOGY STUDIES/RESULTS: Dg Chest 2 View  03/01/2014   CLINICAL DATA:  High potassium. History of diabetes and heart failure.  EXAM: CHEST  2 VIEW  COMPARISON:  01/01/2014  FINDINGS: Mild cardiomegaly and vascular congestion. Low lung volumes. No confluent opacities, effusions or edema. No acute bony abnormality.  IMPRESSION: Cardiomegaly, vascular congestion.   Electronically Signed   By: Rolm Baptise M.D.   On: 03/01/2014 19:12   Ct Head Wo Contrast  03/02/2014   CLINICAL DATA:  Acute encephalopathy with increased lethargy  EXAM: CT HEAD WITHOUT CONTRAST  TECHNIQUE: Contiguous axial images were obtained from the base of the skull through the vertex without intravenous contrast.  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 The overall size and contour of the ventricles remain stable compared to prior study. The sulci are not appreciably enlarged. There is no intracranial mass, hemorrhage, extra-axial fluid collection, or midline shift. There is evidence of a prior  infarct in the superior anterior right frontal lobe. There is patchy small vessel disease in the centra semiovale  bilaterally, stable. The bony calvarium appears intact. There is minimal opacification of posterior mastoid air cells inferiorly on the right. Most of the mastoids on the right are clear. There is diffuse opacification of left mastoid air cells. Extensive calcification is noted in the right globe, stable. Calcification along the retina on the left is stable.  IMPRESSION: Generalized hydrocephalus remains stable without focal site of obstruction. Sulci normal in size. 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 No acute hemorrhage, mass, or edema. No acute appearing infarct. Diffuse opacification left mastoid air cells, stable. Minimal opacification of several right-sided mastoid air cell cells, stable. Calcification in each globe, more severe on the right than on the left, again noted and stable.   Electronically Signed   By: Lowella Grip M.D.   On: 03/02/2014 10:36   US Renal  03/02/2014   CLINICAL DATA:  Acute renal failure.  EXAM: RENAL/URINARY TRACT ULTRASOUND COMPLETE  COMPARISON:  12/29/2013.  FINDINGS: Right Kidney:  Length: 10.8 cm. Echogenicity within normal limits. No mass or hydronephrosis visualized.  Left Kidney:  Left visualized due to overlying bowel gas.  Bladder:  No bladder distention.  IMPRESSION: 1. Right kidney appears unremarkable.  Bladder is nondistended. 2. Left kidney not visualized due to prominent overlying bowel gas.   Electronically Signed   By: Marcello Moores  Register   On: 03/02/2014 12:36   Dg Chest Port 1 View  03/05/2014   CLINICAL DATA:  Hypoxia.  EXAM: PORTABLE CHEST - 1 VIEW  COMPARISON:  March 01, 2014.  FINDINGS: Stable cardiomegaly. Stable mild central pulmonary vascular congestion is noted. No consolidative process is noted. No pneumothorax or pleural effusion is noted. Bony thorax is intact.  IMPRESSION: Stable cardiomegaly and central pulmonary vascular congestion is noted compared to prior exam.   Electronically Signed   By: Sabino Dick M.D.   On: 03/05/2014 08:25    Oren Binet, MD  [facility] Pager:[phone]  If 7PM-7AM, please contact night-coverage [email or web address] Password [email or web address] 03/11/2014, 1:14 PM   LOS: 9 days

## 2014-03-11 NOTE — Progress Notes (Signed)
INITIAL NUTRITION ASSESSMENT  DOCUMENTATION CODES Per approved criteria  -Obesity Unspecified   INTERVENTION: Continue Ensure Pudding po BID, each supplement provides 170 kcal and 4 grams of protein.  Continue Beneprotein (1 scoop) TID with meals, each dose provides 25 kcal and 6 grams of protein.  Provide Glucerna Shake po TID, each supplement provides 220 kcal and 10 grams of protein.  Encourage PO intake.  NUTRITION DIAGNOSIS: Inadequate oral intake related to encephalopathy as evidenced by meal completion of 5-30%.   Goal: Pt to meet >/= 90% of their estimated nutrition needs   Monitor:  PO intake, weight trends, labs, I/O's  Reason for Assessment: Low Braden Score  74 y.o. female  Admitting Dx: Acute encephalopathy  ASSESSMENT: Pt presents with UTI. She has been not taking po very well. She has been also not drinking very well. She has had increased creatinine from herr baseline. In addition she was noted to have a UTI here in the ED. She does also have a history of CHF CKD and recurring Klebsiella UTI. Patient is also legally blind.  Pt was asleep during time of visit and would not wake. Meal completion has been 5-30%. Unable to obtain nutrition hx at this time. No visitors at bedside. Per Epic weight records, pt with a 12.3% weight loss in 6 months. Pt currently has Ensure Pudding and Beneprotein ordered. Will continue with current orders. RD to additionally order Glucerna Shake to aid in caloric and protein needs.   Pt with no observed significant fat or muscle mass loss.  Height: Ht Readings from Last 1 Encounters:  03/07/14 5\' 2"  (1.575 m)    Weight: Wt Readings from Last 1 Encounters:  03/10/14 192 lb 10.9 oz (87.4 kg)    Ideal Body Weight: 110 lbs  % Ideal Body Weight: 175%  Wt Readings from Last 10 Encounters:  03/10/14 192 lb 10.9 oz (87.4 kg)  02/10/14 181 lb (82.101 kg)  01/19/14 189 lb (85.73 kg)  01/08/14 191 lb (86.637 kg)  01/07/14 192 lb  14.4 oz (87.5 kg)  12/23/13 199 lb (90.266 kg)  12/22/13 212 lb 4.8 oz (96.299 kg)  09/30/13 209 lb (94.802 kg)  09/16/13 219 lb (99.338 kg)  09/07/13 217 lb (98.431 kg)    Usual Body Weight: 212 lbs (august 2015)  % Usual Body Weight: 91%  BMI:  Body mass index is 35.23 kg/(m^2).  Estimated Nutritional Needs: Kcal: 1750-2000 Protein: 95-105 grams Fluid: 1. 75 - 2L/day  Skin: wound on right foot and left thigh, left buttocks  Diet Order: Diet regular  EDUCATION NEEDS: -Education not appropriate at this time   Intake/Output Summary (Last 24 hours) at 03/11/14 1058 Last data filed at 03/11/14 0900  Gross per 24 hour  Intake    480 ml  Output      0 ml  Net    480 ml    Last BM: 2/8  Labs:   Recent Labs Lab 03/08/14 0500 03/09/14 1110 03/10/14 1120 03/11/14 0649  NA 140 140 140 140  K 3.9 3.9 3.8 3.9  CL 109 108 110 112  CO2 20 21 17* 17*  BUN 47* 44* 37* 35*  CREATININE 3.29* 3.19* 3.00* 2.80*  CALCIUM 8.4 8.6 8.5 8.4  PHOS 4.3  --   --   --   GLUCOSE 110* 112* 130* 132*    CBG (last 3)   Recent Labs  03/11/14 0004 03/11/14 0429 03/11/14 0739  GLUCAP 163* 139* 112*    Scheduled Meds: .  antiseptic oral rinse  7 mL Mouth Rinse BID  . aspirin  300 mg Rectal Daily   Or  . aspirin EC  325 mg Oral Daily  . collagenase   Topical Daily  . feeding supplement (ENSURE)  1 Container Oral BID BM  . heparin  5,000 Units Subcutaneous 3 times per day  . insulin aspart  0-9 Units Subcutaneous 6 times per day  . metoprolol tartrate  12.5 mg Oral BID  . protein supplement  1 scoop Oral TID WC  . timolol  1 drop Left Eye BID    Continuous Infusions: . sodium chloride 50 mL/hr at 03/09/14 1416    Past Medical History  Diagnosis Date  . Hypertension   . Diabetes mellitus   . Blindness   . TIA (transient ischemic attack)   . Anemia   . GERD (gastroesophageal reflux disease)   . Constipation   . Renal disorder 05/2012    acute Renal Failure  .  SYNCOPE 05/19/2009    Annotation: multiple over past 1 yr  Qualifier: Diagnosis of  By: [name] MD, [name]    . CHOLECYSTECTOMY, HX OF 05/19/2009    Qualifier: Diagnosis of  By: [name] MD, [name]      Past Surgical History  Procedure Laterality Date  . Gallbladder surgery    . Esophageal dilation    . Cholecystectomy    . Amputation  02/18/2012    Procedure: AMPUTATION RAY;  Surgeon: [name], MD;  Location: [facility];  Service: Orthopedics;  Laterality: Right;  right second toe  . Tee without cardioversion  02/21/2012    Procedure: TRANSESOPHAGEAL ECHOCARDIOGRAM (TEE);  Surgeon: [name], MD;  Location: [facility];  Service: Cardiovascular;  Laterality: N/A;   spoke with [name] from [place] pt will arrive by 815ish( thy change shifts at [facility])  . Tee without cardioversion  02/25/2012    Procedure: TRANSESOPHAGEAL ECHOCARDIOGRAM (TEE);  Surgeon: [name], MD;  Location: [place];  Service: Cardiovascular;  Laterality: N/A;  . Amputation Right 09/07/2013    Procedure: AMPUTATION RAY;  Surgeon: [name], MD;  Location: [place];  Service: Orthopedics;  Laterality: Right;  Right Transmetatarsal Amputation    Kallie Locks, MS, RD, LDN Pager # [phone] After hours/ weekend pager # [phone]

## 2014-03-11 NOTE — Clinical Social Work Note (Signed)
CSW continuing to follow patient's progress and Palliative meeting scheduled for Friday, 2/12. CSW will assist with discharge planning as needed based on recommendations from meeting.  Keano Guggenheim Givens, MSW, LCSW Licensed Clinical Social Worker [place] [phone]

## 2014-03-12 LAB — BASIC METABOLIC PANEL
ANION GAP: 12 (ref 5–15)
BUN: 33 mg/dL — ABNORMAL HIGH (ref 6–23)
CO2: 16 mmol/L — ABNORMAL LOW (ref 19–32)
Calcium: 8.3 mg/dL — ABNORMAL LOW (ref 8.4–10.5)
Chloride: 111 mmol/L (ref 96–112)
Creatinine, Ser: 2.57 mg/dL — ABNORMAL HIGH (ref 0.50–1.10)
GFR calc non Af Amer: 18 mL/min — ABNORMAL LOW (ref >90)
GFR, EST AFRICAN AMERICAN: 20 mL/min — AB (ref >90)
Glucose, Bld: 124 mg/dL — ABNORMAL HIGH (ref 70–99)
POTASSIUM: 3.4 mmol/L — AB (ref 3.5–5.1)
Sodium: 139 mmol/L (ref 135–145)

## 2014-03-12 LAB — GLUCOSE, CAPILLARY
GLUCOSE-CAPILLARY: 128 mg/dL — AB (ref 70–99)
Glucose-Capillary: 102 mg/dL — ABNORMAL HIGH (ref 70–99)
Glucose-Capillary: 109 mg/dL — ABNORMAL HIGH (ref 70–99)
Glucose-Capillary: 153 mg/dL — ABNORMAL HIGH (ref 70–99)

## 2014-03-12 MED ORDER — GLUCERNA SHAKE PO LIQD: 237.0000 mL | Freq: Three times a day (TID) | ORAL | Status: DC

## 2014-03-12 NOTE — Discharge Summary (Signed)
PATIENT DETAILS Name: [name] Age: 74 y.o. Sex: female Date of Birth: 1940/04/21 MRN: [id number]. Admitting Physician: Allyne Gee, MD OIZ:[id number], [place], [place]  Admit Date: 03/01/2014 Discharge date: 03/12/2014  Recommendations for Outpatient Follow-up:  1. Repeat chemistries in 1 week 2. Please have palliative care/hospice follow-up at SNF 3. Very poor overall prognosis, will need ongoing discussion/engagement with legal guardian regarding transitioning to full comfort measures.  PRIMARY DISCHARGE DIAGNOSIS:  Principal Problem:   Acute encephalopathy Active Problems:   Type 2 diabetes, controlled, with peripheral circulatory disorder   Essential hypertension   TIA (transient ischemic attack)   UTI (lower urinary tract infection)   Acute on chronic renal failure   Hyperkalemia   Chronic diastolic CHF (congestive heart failure)      PAST MEDICAL HISTORY: Past Medical History  Diagnosis Date  . Hypertension   . Diabetes mellitus   . Blindness   . TIA (transient ischemic attack)   . Anemia   . GERD (gastroesophageal reflux disease)   . Constipation   . Renal disorder 05/2012    acute Renal Failure  . SYNCOPE 05/19/2009    Annotation: multiple over past 1 yr  Qualifier: Diagnosis of  By: [name] MD, [name]    . CHOLECYSTECTOMY, HX OF 05/19/2009    Qualifier: Diagnosis of  By: [name] MD, [name]      DISCHARGE MEDICATIONS: Current Discharge Medication List    START taking these medications   Details  feeding supplement, GLUCERNA SHAKE, (GLUCERNA SHAKE) LIQD Take 237 mLs by mouth 3 (three) times daily between meals. Refills: 0      CONTINUE these medications which have NOT CHANGED   Details  acetaminophen (TYLENOL) 325 MG tablet Take 650 mg by mouth every 6 (six) hours as needed for fever.     Amino Acids-Protein Hydrolys (PRO-STAT) LIQD Take 30 mLs by mouth 2 (two) times daily.    aspirin EC 325 MG tablet Take 325 mg by mouth daily.      atorvastatin  (LIPITOR) 10 MG tablet Take 1 tablet (10 mg total) by mouth daily. Qty: 90 tablet, Refills: 3    carvedilol (COREG) 3.125 MG tablet Take 3.125 mg by mouth 2 (two) times daily with a meal.    docusate sodium (COLACE) 100 MG capsule Take 100 mg by mouth 2 (two) times daily.    DULoxetine (CYMBALTA) 60 MG capsule Take 1 capsule (60 mg total) by mouth daily. When completes 2 wks at 1m Qty: 30 capsule, Refills: 3   Associated Diagnoses: Chronic osteomyelitis of toe of right foot; Status post transmetatarsal amputation of right foot    hydrocortisone (ANUSOL-HC) 2.5 % rectal cream Place 1 application rectally 2 (two) times daily as needed for hemorrhoids.     insulin aspart (NOVOLOG) 100 UNIT/ML injection 0-9 Units, Subcutaneous, 3 times daily with meals,  CBG < 70: implement hypoglycemia protocol CBG 70 - 120: 0 units CBG 121 - 150: 1 unit CBG 151 - 200: 2 units CBG 201 - 250: 3 units CBG 251 - 300: 5 units CBG 301 - 350: 7 units CBG 351 - 400: 9 units CBG > 400: call MD Qty: 10 mL, Refills: 11    ipratropium-albuterol (DUONEB) 0.5-2.5 (3) MG/3ML SOLN Take 3 mLs by nebulization every 6 (six) hours as needed (wheezing).    metoCLOPramide (REGLAN) 5 MG tablet Take 1 tablet (5 mg total) by mouth 4 (four) times daily -  before meals and at bedtime.    Multiple Vitamins-Minerals (MULTIVITAMIN WITH  MINERALS) tablet Take 1 tablet by mouth daily.    polyethylene glycol (MIRALAX / GLYCOLAX) packet Take 17 g by mouth daily. Qty: 14 each, Refills: 0    protein supplement (RESOURCE BENEPROTEIN) POWD Take 1 scoop by mouth 3 (three) times daily with meals.    sodium chloride (OCEAN) 0.65 % SOLN nasal spray Place 1 spray into the nose 4 (four) times daily as needed for congestion.    timolol (TIMOPTIC) 0.5 % ophthalmic solution Place 1 drop into the left eye 2 (two) times daily.    Brinzolamide-Brimonidine (SIMBRINZA) 1-0.2 % SUSP Place 1 drop into the left eye 2 (two) times daily.      collagenase (SANTYL) ointment Apply topically daily. Qty: 15 g, Refills: 0    feeding supplement, ENSURE, (ENSURE) PUDG Take 1 Container by mouth 2 (two) times daily between meals. Qty: 60 Can, Refills: 0    promethazine (PHENERGAN) 25 MG tablet Take 25 mg by mouth every 6 (six) hours as needed for nausea or vomiting.      STOP taking these medications     furosemide (LASIX) 20 MG tablet      gabapentin (NEURONTIN) 100 MG capsule      HYDROcodone-acetaminophen (NORCO/VICODIN) 5-325 MG per tablet      potassium chloride (K-DUR,KLOR-CON) 10 MEQ tablet      PRESCRIPTION MEDICATION         ALLERGIES:  No Known Allergies  BRIEF HPI:  See H&P, Labs, Consult and Test reports for all details in brief, patient  is an 74 y.o. 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  CONSULTATIONS:   nephrology and Palliative care  PERTINENT RADIOLOGIC STUDIES: Dg Chest 2 View  03/01/2014   CLINICAL DATA:  High potassium. History of diabetes and heart failure.  EXAM: CHEST  2 VIEW  COMPARISON:  01/01/2014  FINDINGS: Mild cardiomegaly and vascular congestion. Low lung volumes. No confluent opacities, effusions or edema. No acute bony abnormality.  IMPRESSION: Cardiomegaly, vascular congestion.   Electronically Signed   By: Rolm Baptise M.D.   On: 03/01/2014 19:12   Ct Head Wo Contrast  03/02/2014   CLINICAL DATA:  Acute encephalopathy with increased lethargy  EXAM: CT HEAD WITHOUT CONTRAST  TECHNIQUE: Contiguous axial images were obtained from the base of the skull through the vertex without intravenous contrast.  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 The overall size and contour of the ventricles remain stable compared to prior study. The sulci are not appreciably enlarged. There is no intracranial mass, hemorrhage, extra-axial  fluid collection, or midline shift. There is evidence of a prior infarct in the superior anterior right frontal lobe. There is patchy small vessel disease in the centra semiovale bilaterally, stable. The bony calvarium appears intact. There is minimal opacification of posterior mastoid air cells inferiorly on the right. Most of the mastoids on the right are clear. There is diffuse opacification of left mastoid air cells. Extensive calcification is noted in the right globe, stable. Calcification along the retina on the left is stable.  IMPRESSION: Generalized hydrocephalus remains stable without focal site of obstruction. Sulci normal in size. 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 No acute hemorrhage, mass, or edema. No acute appearing infarct. Diffuse opacification left mastoid air cells, stable. Minimal opacification of several right-sided mastoid air cell cells,  stable. Calcification in each globe, more severe on the right than on the left, again noted and stable.   Electronically Signed   By: Lowella Grip M.D.   On: 03/02/2014 10:36   US Renal  03/02/2014   CLINICAL DATA:  Acute renal failure.  EXAM: RENAL/URINARY TRACT ULTRASOUND COMPLETE  COMPARISON:  12/29/2013.  FINDINGS: Right Kidney:  Length: 10.8 cm. Echogenicity within normal limits. No mass or hydronephrosis visualized.  Left Kidney:  Left visualized due to overlying bowel gas.  Bladder:  No bladder distention.  IMPRESSION: 1. Right kidney appears unremarkable.  Bladder is nondistended. 2. Left kidney not visualized due to prominent overlying bowel gas.   Electronically Signed   By: Marcello Moores  Register   On: 03/02/2014 12:36   Dg Chest Port 1 View  03/05/2014   CLINICAL DATA:  Hypoxia.  EXAM: PORTABLE CHEST - 1 VIEW  COMPARISON:  March 01, 2014.  FINDINGS: Stable cardiomegaly. Stable mild central pulmonary vascular congestion is noted. No consolidative process is noted. No pneumothorax or pleural  effusion is noted. Bony thorax is intact.  IMPRESSION: Stable cardiomegaly and central pulmonary vascular congestion is noted compared to prior exam.   Electronically Signed   By: Sabino Dick M.D.   On: 03/05/2014 08:25     PERTINENT LAB RESULTS: CBC:  Recent Labs  03/09/14 1110  WBC 11.4*  HGB 9.6*  HCT 30.5*  PLT 245   CMET CMP     Component Value Date/Time   NA 139 03/12/2014 0536   NA 140 01/11/2014   K 3.4* 03/12/2014 0536   CL 111 03/12/2014 0536   CO2 16* 03/12/2014 0536   GLUCOSE 124* 03/12/2014 0536   BUN 33* 03/12/2014 0536   BUN 22* 01/11/2014   CREATININE 2.57* 03/12/2014 0536   CREATININE 1.5* 01/11/2014   CALCIUM 8.3* 03/12/2014 0536   PROT 7.1 03/02/2014 0645   ALBUMIN 2.1* 03/08/2014 0500   AST 17 03/02/2014 0645   ALT 11 03/02/2014 0645   ALKPHOS 120* 03/02/2014 0645   BILITOT 0.7 03/02/2014 0645   GFRNONAA 18* 03/12/2014 0536   GFRAA 20* 03/12/2014 0536    GFR Estimated Creatinine Clearance: 21.2 mL/min (by C-G formula based on Cr of 2.57). No results for input(s): LIPASE, AMYLASE in the last 72 hours. No results for input(s): CKTOTAL, CKMB, CKMBINDEX, TROPONINI in the last 72 hours. Invalid input(s): POCBNP No results for input(s): DDIMER in the last 72 hours. No results for input(s): HGBA1C in the last 72 hours. No results for input(s): CHOL, HDL, LDLCALC, TRIG, CHOLHDL, LDLDIRECT in the last 72 hours. No results for input(s): TSH, T4TOTAL, T3FREE, THYROIDAB in the last 72 hours.  Invalid input(s): FREET3 No results for input(s): VITAMINB12, FOLATE, FERRITIN, TIBC, IRON, RETICCTPCT in the last 72 hours. Coags: No results for input(s): INR in the last 72 hours.  Invalid input(s): PT Microbiology: Recent Results (from the past 240 hour(s))  Culture, Urine     Status: None   Collection Time: 03/03/14 10:54 AM  Result Value Ref Range Status   Specimen Description URINE, CLEAN CATCH  Final   Special Requests NONE  Final   Colony Count NO  GROWTH Performed at [facility]   Final   Culture NO GROWTH Performed at [facility]   Final   Report Status 03/04/2014 FINAL  Final     BRIEF HOSPITAL COURSE:  Acute encephalopathy: Likely toxic metabolic encephalopathy secondary to renal failure and presumed UTI. Encephalopathy seems to have significantly improved  than the past few days, this am per RN patient consumed around 30-40% of her meals. She is easily arousable, and answers all my questions appropriately this morning. Suspect she has met maximum benefit from a hospital stay, given overall poor prognosis, no further workup recommended at this time. Please note CT head without any acute abnormalities. Patient will require continued follow-up with palliative care services at SNF to continue on goals of care/end-of-life discussion. Currently stable to be discharged back to SNF.  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 Patient was seen in consult by nephrology she was continued on gentle hydration. She has baseline chronic disease stage IV, she had a peak creatinine of 4.97, by day of discharge, creatinine had trended down to 2.57. Her baseline creatinine ranges between 2-2.5. Unfortunately, given failure to thrive syndrome, continued poor oral intake, she is at high risk of recurrence of renal failure. As noted above, she will require continued engagement by the palliative care team was at [facility] for further delineation of goals of care and end-of-life issues. Please continue to encourage oral intake is much as possible. Per RN, patient now slowly consuming around 30% for meals. Continue to check electrolytes frequently. As noted above, she has met maximum benefit from a hospital stay, suspect she has plateaued in terms of improvement and further improvement is unlikely unless she starts eating again.   Type 2 diabetes, controlled, with peripheral  circulatory disorder: CBGs stable, continue SSI on discharge   Essential hypertension: BP stable, continue Coreg   UTI (lower urinary tract infection): Has complete a course of antibiotics. Urine culture negative.   Hyperkalemia: Resolved. Likely secondary to worsening renal function.   Chronic diastolic CHF (congestive heart failure): Compensated, given poor oral intake I have discontinued Lasix permanently.   Failure to thrive syndrome:poor oral intake, bed bound-multiple medical problems including blindness. Will likely continue to deteriorate longterm. This M.D. spoke with legal guardian on 2/11 and relayed poor overall prognosis. As noted above, palliative care was consulted during this hospital stay, and will need further engagement by the palliative care services at [facility]. She carries a very poor oral prognosis, unless her appetite changes, she is at risk of developing further worsening of renal function. Per RN, patient now slowly consuming around 30% for meals.  TODAY-DAY OF DISCHARGE:  Subjective:   [name] today has is awake and alert. Per RN, she consumed around 20% of her meals. Patient is able to answer all my questions appropriately, and is following all commands  Objective:   Blood pressure 172/89, pulse 73, temperature 98 F (36.7 C), temperature source Oral, resp. rate 18, height _0  (1.575 m), weight 91.9 kg (202 lb 9.6 oz), SpO2 100 %.  Intake/Output Summary (Last 24 hours) at 03/12/14 1033 Last data filed at 03/12/14 0900  Gross per 24 hour  Intake    600 ml  Output    300 ml  Net    300 ml   Filed Weights   03/09/14 2038 03/10/14 2040 03/11/14 2013  Weight: 87.2 kg (192 lb 3.9 oz) 87.4 kg (192 lb 10.9 oz) 91.9 kg (202 lb 9.6 oz)    Exam Awake Alert, Oriented *3, No new F.N deficits [place].AT,PERRAL Supple Neck,No JVD, No cervical lymphadenopathy appriciated.  Symmetrical Chest wall movement, Good air movement bilaterally, CTAB RRR,No Gallops,Rubs  or new Murmurs, No Parasternal Heave +ve B.Sounds, Abd Soft, Non tender, No organomegaly appriciated, No rebound -  guarding or rigidity. No Cyanosis, Clubbing or edema, No new Rash or bruise  DISCHARGE CONDITION: Stable  DISPOSITION: SNF  DISCHARGE INSTRUCTIONS:    Activity:  As tolerated  Diet recommendation: Regular Diet  Discharge Instructions    Diet general    Complete by:  As directed      Increase activity slowly    Complete by:  As directed            Follow-up Information    Follow up with Gildardo Cranker, DO.   Specialty:  Internal Medicine   Contact information:   [place] [id number] [phone]       Total Time spent on discharge equals 45 minutes.  SignedOren Binet 03/12/2014 10:33 AM

## 2014-03-12 NOTE — Progress Notes (Signed)
PT Cancellation Note  Patient Details Name: Cornelious Diven MRN: [id number] DOB: April 03, 1940   Cancelled Treatment:    Reason Eval/Treat Not Completed: Per chart review pt to d/c back to SNF today. Will continue to follow until d/c.    Rolinda Roan 03/12/2014, 1:47 PM   Rolinda Roan, PT, DPT Acute Rehabilitation Services Pager: [phone]

## 2014-03-12 NOTE — Progress Notes (Signed)
Patient Discharge: Disposition: Patient discharged to [facility]. Given report to the nurse at the facility. IV: Discontinued before discharge.   Telemetry: Discontinued before discharge, CCMD notified. Transportation: Transported via EMS. Belongings: Patient took all her belongings with her.

## 2014-03-12 NOTE — Plan of Care (Signed)
Problem: Phase II Progression Outcomes Goal: Tolerating diet Outcome: Completed/Met Date Met:  03/12/14 Tolerating diet but very poor eater.

## 2014-03-12 NOTE — Clinical Social Work Note (Signed)
Per MD, patient ready to discharge back to [facility] today. Facility aware and d/c summary transmitted to GL. Patient's guardian, the Department of Social Services SW Sallye Lat contacted.  Sterling Ucci Givens, MSW, LCSW Licensed Clinical Social Worker [place] [phone]

## 2014-03-12 NOTE — Care Management Note (Signed)
CARE MANAGEMENT NOTE 03/12/2014  Patient:  [name], [name]   Account Number:  [id number]  Date Initiated:  03/05/2014  Documentation initiated by:  Rhylee Pucillo  Subjective/Objective Assessment:   CM following for progression and d/c planning. This pt is a resident of [place], where she is "total Care" . This pt is not ambulatory or mobil.     Action/Plan:   following for d/c needs.   Anticipated DC Date:  03/12/2014   Anticipated DC Plan:  SKILLED NURSING FACILITY         Choice offered to / List presented to:             Status of service:  Completed, signed off Medicare Important Message given?  YES (If response is "NO", the following Medicare IM given date fields will be blank) Date Medicare IM given:  03/05/2014 Medicare IM given by:  [place] Alfredo Date Additional Medicare IM given:  03/08/2014 Additional Medicare IM given by:  [facility]  Discharge Disposition:  [place]  Per UR Regulation:    If discussed at Long Length of Stay Meetings, dates discussed:    Comments:  03/12/2014 Pt for d/c back to SNF today. IM given today.  Pt unresponsive to explaination, d/c discussed with pt guardian by [facility] NP. Jasmine Pang RN MPH, case manager, [phone]

## 2014-03-15 ENCOUNTER — Non-Acute Institutional Stay (SKILLED_NURSING_FACILITY): Payer: Medicare Other | Admitting: Internal Medicine

## 2014-03-15 ENCOUNTER — Encounter: Payer: Self-pay | Admitting: Internal Medicine

## 2014-03-15 DIAGNOSIS — E1159 Type 2 diabetes mellitus with other circulatory complications: Secondary | ICD-10-CM | POA: Diagnosis not present

## 2014-03-15 DIAGNOSIS — N183 Chronic kidney disease, stage 3 unspecified: Secondary | ICD-10-CM

## 2014-03-15 DIAGNOSIS — R627 Adult failure to thrive: Secondary | ICD-10-CM

## 2014-03-15 DIAGNOSIS — R339 Retention of urine, unspecified: Secondary | ICD-10-CM | POA: Diagnosis not present

## 2014-03-15 DIAGNOSIS — E1151 Type 2 diabetes mellitus with diabetic peripheral angiopathy without gangrene: Secondary | ICD-10-CM

## 2014-03-15 DIAGNOSIS — I5032 Chronic diastolic (congestive) heart failure: Secondary | ICD-10-CM

## 2014-03-19 ENCOUNTER — Ambulatory Visit: Payer: Medicare Other | Admitting: Gastroenterology

## 2014-04-03 NOTE — Progress Notes (Signed)
Patient ID: [name], female   DOB: 1940-12-01, 74 y.o.   MRN: [id number]    HISTORY AND PHYSICAL  Location:  [facility]    Place of Service: SNF [phone])   Extended Emergency Contact Information Primary Emergency Contact: [place] Phone: [phone] Relation: Legal Guardian Secondary Emergency Contact: Farrel [place] Phone: [phone] Relation: Daughter  Advanced Directive information  FULL CODE  Chief Complaint  Patient presents with  . Readmit To SNF    acute/chronic renal failure; FTT    HPI:  74 yo female long term resident seen today as a readmission into SNF after hospitalization for acute/chronic renal failure, failure-to-thrive, and toxic metabolic encephalopathy due to ARF. Palliative care consulted due to pt's overall poor prognosis. Mental status improved to baseline and she was d/cd back to SNF. Her elevated K returned to nml prior to d/c. She remained compensated for HF. CBGs stable and no changes made to DM.  Today, CBGs remain stable. Weight has not been fluctuating. She remains on [place] O2 ATC at 2L/min. She continues to have urinary retention and was d/c'd with foley cath. Palliative care consulted.    Past Medical History  Diagnosis Date  . Hypertension   . Diabetes mellitus   . Blindness   . TIA (transient ischemic attack)   . Anemia   . GERD (gastroesophageal reflux disease)   . Constipation   . Renal disorder 05/2012    acute Renal Failure  . SYNCOPE 05/19/2009    Annotation: multiple over past 1 yr  Qualifier: Diagnosis of  By: [name] MD, [name]    . CHOLECYSTECTOMY, HX OF 05/19/2009    Qualifier: Diagnosis of  By: [name] MD, [name]      Past Surgical History  Procedure Laterality Date  . Gallbladder surgery    . Esophageal dilation    . Cholecystectomy    . Amputation  02/18/2012    Procedure: AMPUTATION RAY;  Surgeon: [name], MD;  Location: [facility]  ORS;  Service: Orthopedics;  Laterality: Right;  right second toe  . Tee without cardioversion  02/21/2012    Procedure: TRANSESOPHAGEAL ECHOCARDIOGRAM (TEE);  Surgeon: [name], MD;  Location: [facility] ENDOSCOPY;  Service: Cardiovascular;  Laterality: N/A;   spoke with [name] from [place] pt will arrive by 815ish( thy change shifts at [place])  . Tee without cardioversion  02/25/2012    Procedure: TRANSESOPHAGEAL ECHOCARDIOGRAM (TEE);  Surgeon: [name], MD;  Location: [facility];  Service: Cardiovascular;  Laterality: N/A;  . Amputation Right 09/07/2013    Procedure: AMPUTATION RAY;  Surgeon: [name], MD;  Location: [facility];  Service: Orthopedics;  Laterality: Right;  Right Transmetatarsal Amputation    Patient Care Team: Shea Evans, DO as PCP - General (Internal Medicine) Gerlene Fee, NP as Nurse Practitioner (Nurse Practitioner)  History   Social History  . Marital Status: Divorced    Spouse Name: N/A  . Number of Children: N/A  . Years of Education: N/A   Occupational History  . Not on file.   Social History Main Topics  . Smoking status: Former Smoker -- 0 years    Quit date: 06/25/1999  . Smokeless tobacco: Current User    Types: Snuff  . Alcohol Use: No  . Drug Use: No  . Sexual Activity: Not on file   Other Topics Concern  . Not on file   Social History Narrative  reports that she quit smoking about 14 years ago. Her smokeless tobacco use includes Snuff. She reports that she does not drink alcohol or use illicit drugs.  Family History  Problem Relation Age of Onset  . Heart failure Mother    Family Status  Relation Status Death Age  . Mother Deceased   . Father Deceased     Immunization History  Administered Date(s) Administered  . Influenza Whole 11/10/2012  . Influenza-Unspecified 11/09/2013  . Pneumococcal-Unspecified 06/04/2012    No Known Allergies  Medications: Patient's Medications  New Prescriptions   No  medications on file  Previous Medications   ACETAMINOPHEN (TYLENOL) 325 MG TABLET    Take 650 mg by mouth every 6 (six) hours as needed for fever.    AMINO ACIDS-PROTEIN HYDROLYS (PRO-STAT) LIQD    Take 30 mLs by mouth 2 (two) times daily.   ASPIRIN EC 325 MG TABLET    Take 325 mg by mouth daily.     ATORVASTATIN (LIPITOR) 10 MG TABLET    Take 1 tablet (10 mg total) by mouth daily.   BRINZOLAMIDE-BRIMONIDINE (SIMBRINZA) 1-0.2 % SUSP    Place 1 drop into the left eye 2 (two) times daily.   CARVEDILOL (COREG) 3.125 MG TABLET    Take 3.125 mg by mouth 2 (two) times daily with a meal.   COLLAGENASE (SANTYL) OINTMENT    Apply topically daily.   DOCUSATE SODIUM (COLACE) 100 MG CAPSULE    Take 100 mg by mouth 2 (two) times daily.   DULOXETINE (CYMBALTA) 60 MG CAPSULE    Take 1 capsule (60 mg total) by mouth daily. When completes 2 wks at 30mg    FEEDING SUPPLEMENT, ENSURE, (ENSURE) PUDG    Take 1 Container by mouth 2 (two) times daily between meals.   FEEDING SUPPLEMENT, GLUCERNA SHAKE, (GLUCERNA SHAKE) LIQD    Take 237 mLs by mouth 3 (three) times daily between meals.   HYDROCORTISONE (ANUSOL-HC) 2.5 % RECTAL CREAM    Place 1 application rectally 2 (two) times daily as needed for hemorrhoids.    INSULIN ASPART (NOVOLOG) 100 UNIT/ML INJECTION    0-9 Units, Subcutaneous, 3 times daily with meals,  CBG < 70: implement hypoglycemia protocol CBG 70 - 120: 0 units CBG 121 - 150: 1 unit CBG 151 - 200: 2 units CBG 201 - 250: 3 units CBG 251 - 300: 5 units CBG 301 - 350: 7 units CBG 351 - 400: 9 units CBG > 400: call MD   IPRATROPIUM-ALBUTEROL (DUONEB) 0.5-2.5 (3) MG/3ML SOLN    Take 3 mLs by nebulization every 6 (six) hours as needed (wheezing).   METOCLOPRAMIDE (REGLAN) 5 MG TABLET    Take 1 tablet (5 mg total) by mouth 4 (four) times daily -  before meals and at bedtime.   MULTIPLE VITAMINS-MINERALS (MULTIVITAMIN WITH MINERALS) TABLET    Take 1 tablet by mouth daily.   POLYETHYLENE GLYCOL (MIRALAX /  GLYCOLAX) PACKET    Take 17 g by mouth daily.   PROMETHAZINE (PHENERGAN) 25 MG TABLET    Take 25 mg by mouth every 6 (six) hours as needed for nausea or vomiting.   PROTEIN SUPPLEMENT (RESOURCE BENEPROTEIN) POWD    Take 1 scoop by mouth 3 (three) times daily with meals.   SODIUM CHLORIDE (OCEAN) 0.65 % SOLN NASAL SPRAY    Place 1 spray into the nose 4 (four) times daily as needed for congestion.   TIMOLOL (TIMOPTIC) 0.5 % OPHTHALMIC SOLUTION    Place 1 drop  into the left eye 2 (two) times daily.  Modified Medications   No medications on file  Discontinued Medications   No medications on file    Review of Systems  Constitutional: Positive for appetite change and fatigue. Negative for fever and chills.  HENT: Negative for trouble swallowing.   Eyes: Positive for visual disturbance (she is legally blind).  Respiratory: Negative for shortness of breath.   Cardiovascular: Negative for chest pain and leg swelling.  Gastrointestinal: Negative for abdominal pain.  Genitourinary: Positive for difficulty urinating.  Neurological: Negative for dizziness, seizures and headaches.  Psychiatric/Behavioral: Negative for confusion and sleep disturbance. The patient is not nervous/anxious.     Filed Vitals:   03/15/14 1637  BP: 139/80  Pulse: 64  Temp: 97.4 F (36.3 C)   There is no weight on file to calculate BMI.  Physical Exam  Constitutional: She is oriented to person, place, and time.  Frail appearing in NAD. Awake and alert  HENT:  Mouth/Throat: Oropharynx is clear and moist. No oropharyngeal exudate.  Eyes: Conjunctivae are normal. Pupils are equal, round, and reactive to light. No scleral icterus.    Neck: Neck supple. No tracheal deviation present. No thyromegaly present.  Cardiovascular: Normal rate, regular rhythm and intact distal pulses.  Exam reveals no gallop and no friction rub.   Murmur (1/6 SEM) heard. No LE edema b/l. no calf TTP. No carotid bruit b/l  Pulmonary/Chest:  Effort normal and breath sounds normal. No stridor. No respiratory distress. She has no wheezes. She has no rales.  Abdominal: Soft. Bowel sounds are normal. She exhibits no distension and no mass. There is no tenderness. There is no rebound and no guarding.  Genitourinary:  Foley cath intact and clear yellow urine DTG. No sediment  Musculoskeletal:  Soft boot intact on right   Lymphadenopathy:    She has no cervical adenopathy.  Neurological: She is alert and oriented to person, place, and time. She has normal reflexes.  Skin: Skin is warm and dry. No rash noted.  Psychiatric: She has a normal mood and affect. Her behavior is normal.     Labs reviewed: CBC Latest Ref Rng 03/09/2014 03/08/2014 03/07/2014  WBC 4.0 - 10.5 K/uL 11.4(H) 10.9(H) 13.1(H)  Hemoglobin 12.0 - 15.0 g/dL 9.6(L) 9.2(L) 9.8(L)  Hematocrit 36.0 - 46.0 % 30.5(L) 28.5(L) 30.7(L)  Platelets 150 - 400 K/uL 245 250 263    CMP Latest Ref Rng 03/12/2014 03/11/2014 03/10/2014  Glucose 70 - 99 mg/dL 124(H) 132(H) 130(H)  BUN 6 - 23 mg/dL 33(H) 35(H) 37(H)  Creatinine 0.50 - 1.10 mg/dL 2.57(H) 2.80(H) 3.00(H)  Sodium 135 - 145 mmol/L 139 140 140  Potassium 3.5 - 5.1 mmol/L 3.4(L) 3.9 3.8  Chloride 96 - 112 mmol/L 111 112 110  CO2 19 - 32 mmol/L 16(L) 17(L) 17(L)  Calcium 8.4 - 10.5 mg/dL 8.3(L) 8.4 8.5  Total Protein 6.0 - 8.3 g/dL - - -  Total Bilirubin 0.3 - 1.2 mg/dL - - -  Alkaline Phos 39 - 117 U/L - - -  AST 0 - 37 U/L - - -  ALT 0 - 35 U/L - - -      Dg Chest Port 1 View  03/05/2014   CLINICAL DATA:  Hypoxia.  EXAM: PORTABLE CHEST - 1 VIEW  COMPARISON:  March 01, 2014.  FINDINGS: Stable cardiomegaly. Stable mild central pulmonary vascular congestion is noted. No consolidative process is noted. No pneumothorax or pleural effusion is noted. Bony thorax is intact.  IMPRESSION: Stable cardiomegaly and central pulmonary vascular congestion is noted compared to prior exam.   Electronically Signed   By: Sabino Dick M.D.    On: 03/05/2014 08:25   Hospital records reviewed - Creatinine 2.57 at d/c with K 3.4 (Cr 4.97 at admission with K 5.6). CT head neg for acute changes. Pt began to consume at least 30% meals prior to d/c (was 20%). Heart failure remained compensated. CBGs stable.  Assessment/Plan   ICD-9-CM ICD-10-CM   1. Failure to thrive in adult -unchanged 783.7 R62.7   2. Chronic renal failure, stage 3- back to baseline (moderate) 585.3 N18.3   3. Type 2 diabetes, controlled, with peripheral circulatory disorder on SSI 250.70 E11.59    443.81    4.    Compensated chronic diastolic heart failure- stable on coreg, lipitor, ASA 5. Urine retention - foley cath intact  --check CBC w diff and BMET this week  --GI eval Dr Deatra Ina on 2/19th  --continue care to foley cath as indicated  --palliative care to f/u. Code status needs to be readdressed  --PT/ST as indicated  --will follow   Ashe Graybeal S. Perlie Gold  [facility] [address] [place], [place] [address] [phone] Office (Wednesdays and Fridays 8 AM - 5 PM) [phone] Cell (Monday-Friday 8 AM - 5 PM)

## 2014-04-07 ENCOUNTER — Non-Acute Institutional Stay (SKILLED_NURSING_FACILITY): Payer: Medicare Other | Admitting: Adult Health

## 2014-04-07 DIAGNOSIS — R634 Abnormal weight loss: Secondary | ICD-10-CM | POA: Diagnosis not present

## 2014-04-07 DIAGNOSIS — R339 Retention of urine, unspecified: Secondary | ICD-10-CM | POA: Diagnosis not present

## 2014-04-07 DIAGNOSIS — M159 Polyosteoarthritis, unspecified: Secondary | ICD-10-CM

## 2014-04-07 DIAGNOSIS — I5043 Acute on chronic combined systolic (congestive) and diastolic (congestive) heart failure: Secondary | ICD-10-CM

## 2014-04-07 DIAGNOSIS — E785 Hyperlipidemia, unspecified: Secondary | ICD-10-CM | POA: Diagnosis not present

## 2014-04-07 DIAGNOSIS — E1159 Type 2 diabetes mellitus with other circulatory complications: Secondary | ICD-10-CM

## 2014-04-07 DIAGNOSIS — M150 Primary generalized (osteo)arthritis: Secondary | ICD-10-CM

## 2014-04-07 DIAGNOSIS — I6350 Cerebral infarction due to unspecified occlusion or stenosis of unspecified cerebral artery: Secondary | ICD-10-CM

## 2014-04-07 DIAGNOSIS — I639 Cerebral infarction, unspecified: Secondary | ICD-10-CM | POA: Diagnosis not present

## 2014-04-07 DIAGNOSIS — R627 Adult failure to thrive: Secondary | ICD-10-CM | POA: Diagnosis not present

## 2014-04-07 DIAGNOSIS — E1151 Type 2 diabetes mellitus with diabetic peripheral angiopathy without gangrene: Secondary | ICD-10-CM

## 2014-05-04 ENCOUNTER — Non-Acute Institutional Stay (SKILLED_NURSING_FACILITY): Payer: Medicare Other | Admitting: Adult Health

## 2014-05-04 DIAGNOSIS — R627 Adult failure to thrive: Secondary | ICD-10-CM | POA: Diagnosis not present

## 2014-05-04 DIAGNOSIS — E1159 Type 2 diabetes mellitus with other circulatory complications: Secondary | ICD-10-CM | POA: Diagnosis not present

## 2014-05-04 DIAGNOSIS — I739 Peripheral vascular disease, unspecified: Secondary | ICD-10-CM | POA: Diagnosis not present

## 2014-05-04 DIAGNOSIS — E1151 Type 2 diabetes mellitus with diabetic peripheral angiopathy without gangrene: Secondary | ICD-10-CM

## 2014-05-04 DIAGNOSIS — K219 Gastro-esophageal reflux disease without esophagitis: Secondary | ICD-10-CM | POA: Diagnosis not present

## 2014-05-04 DIAGNOSIS — I5032 Chronic diastolic (congestive) heart failure: Secondary | ICD-10-CM | POA: Diagnosis not present

## 2014-05-04 DIAGNOSIS — I10 Essential (primary) hypertension: Secondary | ICD-10-CM

## 2014-05-04 DIAGNOSIS — G459 Transient cerebral ischemic attack, unspecified: Secondary | ICD-10-CM

## 2014-05-04 DIAGNOSIS — R634 Abnormal weight loss: Secondary | ICD-10-CM

## 2014-05-10 ENCOUNTER — Ambulatory Visit: Payer: Medicare Other | Admitting: Gastroenterology

## 2014-05-14 ENCOUNTER — Encounter: Payer: Self-pay | Admitting: Adult Health

## 2014-05-14 DIAGNOSIS — R627 Adult failure to thrive: Secondary | ICD-10-CM | POA: Insufficient documentation

## 2014-05-14 DIAGNOSIS — R634 Abnormal weight loss: Secondary | ICD-10-CM | POA: Insufficient documentation

## 2014-05-14 MED ORDER — DULOXETINE HCL 30 MG PO CPEP: 30.0000 mg | ORAL_CAPSULE | Freq: Every day | ORAL | Status: DC

## 2014-05-14 NOTE — Progress Notes (Signed)
Patient ID: [name], female   DOB: [birth year], 74 y.o.   MRN: [id number]  [facility]     No Known Allergies     Chief Complaint  Patient presents with  . Medical Management of Chronic Issues    HPI:  She is a long term resident of this faciltiy being seen for the management of her chronic illnesses. She is losing weight. Her weight on 03-14-14 was 197 pounds her current weight is 197 pounds. She is not eating and is drinking little. The nursing staff further reports that she is more lethargic. She continues to  decline.    Past Medical History  Diagnosis Date  . Hypertension   . Diabetes mellitus   . Blindness   . TIA (transient ischemic attack)   . Anemia   . GERD (gastroesophageal reflux disease)   . Constipation   . Renal disorder 05/2012    acute Renal Failure  . SYNCOPE 05/19/2009    Annotation: multiple over past 1 yr  Qualifier: Diagnosis of  By: [name] MD, [name]    . CHOLECYSTECTOMY, HX OF 05/19/2009    Qualifier: Diagnosis of  By: [name] MD, [name]      Past Surgical History  Procedure Laterality Date  . Gallbladder surgery    . Esophageal dilation    . Cholecystectomy    . Amputation  02/18/2012    Procedure: AMPUTATION RAY;  Surgeon: [name], MD;  Location: [facility] ORS;  Service: Orthopedics;  Laterality: Right;  right second toe  . Tee without cardioversion  02/21/2012    Procedure: TRANSESOPHAGEAL ECHOCARDIOGRAM (TEE);  Surgeon: [name], MD;  Location: [facility] ENDOSCOPY;  Service: Cardiovascular;  Laterality: N/A;   spoke with [name] from [place] pt will arrive by 815ish( thy change shifts at [facility])  . Tee without cardioversion  02/25/2012    Procedure: TRANSESOPHAGEAL ECHOCARDIOGRAM (TEE);  Surgeon: [name], MD;  Location: [facility];  Service: Cardiovascular;  Laterality: N/A;  . Amputation Right 09/07/2013    Procedure: AMPUTATION RAY;  Surgeon: [name], MD;  Location: [facility];  Service: Orthopedics;  Laterality: Right;  Right  Transmetatarsal Amputation    VITAL SIGNS BP 118/65 mmHg  Pulse 60  Ht 4\' 10"  (1.473 m)  Wt 183 lb (83.008 kg)  BMI 38.26 kg/m2   Outpatient Encounter Prescriptions as of 04/07/2014  Medication Sig  . acetaminophen (TYLENOL) 325 MG tablet Take 650 mg by mouth every 6 (six) hours as needed for fever.   . Amino Acids-Protein Hydrolys (PRO-STAT) LIQD Take 30 mLs by mouth 2 (two) times daily.  Marland Kitchen aspirin EC 325 MG tablet Take 325 mg by mouth daily.    Marland Kitchen atorvastatin (LIPITOR) 10 MG tablet Take 1 tablet (10 mg total) by mouth daily.  . Brinzolamide-Brimonidine (SIMBRINZA) 1-0.2 % SUSP Place 1 drop into the left eye 2 (two) times daily.  . carvedilol (COREG) 3.125 MG tablet Take 3.125 mg by mouth 2 (two) times daily with a meal.  . collagenase (SANTYL) ointment Apply topically daily.  Marland Kitchen docusate sodium (COLACE) 100 MG capsule Take 100 mg by mouth 2 (two) times daily.  . DULoxetine (CYMBALTA) 60 MG capsule Take 1 capsule (60 mg total) by mouth daily. When completes 2 wks at 30mg   . feeding supplement, ENSURE, (ENSURE) PUDG Take 1 Container by mouth 2 (two) times daily between meals.  . feeding supplement, GLUCERNA SHAKE, (GLUCERNA SHAKE) LIQD Take 237 mLs by mouth 3 (three) times daily between meals.  . hydrocortisone (  ANUSOL-HC) 2.5 % rectal cream Place 1 application rectally 2 (two) times daily as needed for hemorrhoids.   . insulin aspart (NOVOLOG) 100 UNIT/ML injection  5 units prior to meals for cbg >=150  . ipratropium-albuterol (DUONEB) 0.5-2.5 (3) MG/3ML SOLN Take 3 mLs by nebulization every 6 (six) hours as needed (wheezing).  . metoCLOPramide (REGLAN) 5 MG tablet Take 1 tablet (5 mg total) by mouth 4 (four) times daily -  before meals and at bedtime.  . Multiple Vitamins-Minerals (MULTIVITAMIN WITH MINERALS) tablet Take 1 tablet by mouth daily.  . polyethylene glycol (MIRALAX / GLYCOLAX) packet Take 17 g by mouth daily.  . timolol (TIMOPTIC) 0.5 % ophthalmic solution Place 1 drop into  the left eye 2 (two) times daily.     SIGNIFICANT DIAGNOSTIC EXAMS   12-20-13: mri right foot: 1. No findings of osteomyelitis or abscess, but the soft tissues laterally along the stump appear very thin overlying the distal fourth and fifth metatarsal amputation margins dorsally, possibly from ulceration or soft tissue breakdown.  12-26-13: chest x-ray: Cardiomegaly. Mild interstitial prominence bilaterally suspicious for mild interstitial edema. No segmental infiltrate  12-26-13: ct of abdomen and pelvis: 1. No acute abnormality. 2. Bilateral vascular renal calcifications and at least 1 nonobstructing right renal calculus.3. Small bilateral pleural effusions.4. Mild right lower lobe cylindrical bronchiectasis.5. Mild bibasilar atelectasis. 6. Dense coronary artery atheromatous calcifications.  12-27-13: 2-d echo: Mild LVH with LVEF 50-55%, inferolateral hypokinesis, grade 1 diastolic dysfunction with increased filling pressures. Mild left atrial enlargement. MAC with moderate mitral regurgitation. Mild tricuspid regurgitation with PASP 49 mmHg.  12-28-13: ct of head: No acute intracranial abnormality. Stable hydrocephalus.  Minimal change since 12/18/2013.  12-29-13: chest x-ray: Bronchitic changes with posterior density at the lung bases on lateral view question pleural effusions, cannot exclude RIGHT lower lobe consolidation.  12-29-13: renal ultrasound: No hydronephrosis or evidence of nephrolithiasis.  01-01-14: chest x-ray: Low lung volumes with increase in bibasilar atelectasis. Mild interstitial edema and stable cardiomegaly.  02-26-14: chest x-ray: mild congestion       LABS REVIEWED:   12-18-13: hgb a1c 6.2; tsh 0.858 12-27-13: wbc 9.5; hgb 10.1; hct 32.0; mcv 90.1; plt 275; glucose 169; bun 22; creat 0.97; k+3.9; na++142; liver normal albumin 2.3; BNP 4645 12-28-13: ammonia 26 12-30-13: glucose 120; bun 24; creat 2.1; k+ 4.0; na++142 01-05-14: glucose 79; bun 28; creat 2.46;  k+4.2; na++ 140  01-11-14: glucose 147; bun 21.6; creat 1.52; k+4.8; na++140  03-20-14: wbc 7.4; hgb 8.9; hct 32.1; mcv 94.4; plt 261; glucose 187; bun 19.6; creat 2.05; k+3.6; na++142 04-06-14: wbc 4.1; hgb 9.6; hct 30.7; mcv 89.7 plt 212; glucose 91; bun 15; creat 1.26; k+3.9;  na++136 liver normal albumin 2.6       ROS Unable to perform ROS   Physical Exam Constitutional: No distress.  Obese   Neck: Neck supple. No JVD present. No thyromegaly present.  Cardiovascular: Normal rate and regular rhythm.   Pedal pulses present status post right metatarsal amputation   Respiratory: Effort normal and breath sounds normal. No respiratory distress.  GI: Soft. Bowel sounds are normal. She exhibits no distension.  Genitourinary:  Has foley   Musculoskeletal: She exhibits no edema.  Is able to move all extremities   Neurological: She is alert.  Skin: Skin is warm and dry. She is not diaphoretic.     ASSESSMENT/ PLAN:   1. Diabetes: will continue novolog 5 units prior to meals for cbg >=150; and will monitor status her hgb  a1c is 6.2   2.systolic and diastolic heart failure: Will continue coreg 3.125 mg twice daily and will monitor   3. peripheral neuropathy: will continue; will continue vicodin 5/325 mg every 6 hours for pain; will reduce  cymbalta to  30 mg daily  and will monitor her status.   4.dyslipidemia: will stop lipitor due to her weight loss and will monitor her status.   5. GERD: is stable will continue reglan 5 mg four times daily will monitor   6. Constipation: will continue colace twice daily; and miralax daily   7. TIA: is neurologically stable; will continue asa 325 mg daily   8. Glaucoma: will continue simbrinza to left eye twice daily and timoptic to left eye twice daily   9. Depression: she is without change will reduce  cymbalta 30 mg daily due to her increased lethargy will monitor   10. Urine retention: has foley long term will monitor has completed  treatment for recent uti.  11. Weight loss/FTT: will begin remeron 7.5 mg nightly for 30 days and will monitor her status.       Ok Edwards NP [facility]  Contact [phone] Monday through Friday 8am- 5pm  After hours call [phone]

## 2014-05-20 ENCOUNTER — Encounter (HOSPITAL_COMMUNITY): Payer: Self-pay | Admitting: Emergency Medicine

## 2014-05-20 ENCOUNTER — Emergency Department (HOSPITAL_COMMUNITY): Payer: Medicare Other

## 2014-05-20 ENCOUNTER — Inpatient Hospital Stay (HOSPITAL_COMMUNITY)
Admission: EM | Admit: 2014-05-20 | Discharge: 2014-05-25 | DRG: 871 | Disposition: A | Discharge status: Skilled Nursing Facility | Payer: Medicare Other | Attending: Internal Medicine | Admitting: Internal Medicine

## 2014-05-20 DIAGNOSIS — R509 Fever, unspecified: Secondary | ICD-10-CM | POA: Diagnosis present

## 2014-05-20 DIAGNOSIS — Y95 Nosocomial condition: Secondary | ICD-10-CM | POA: Diagnosis present

## 2014-05-20 DIAGNOSIS — G9340 Encephalopathy, unspecified: Secondary | ICD-10-CM | POA: Diagnosis present

## 2014-05-20 DIAGNOSIS — R52 Pain, unspecified: Secondary | ICD-10-CM | POA: Diagnosis not present

## 2014-05-20 DIAGNOSIS — H540 Blindness, both eyes: Secondary | ICD-10-CM | POA: Diagnosis present

## 2014-05-20 DIAGNOSIS — E876 Hypokalemia: Secondary | ICD-10-CM | POA: Diagnosis present

## 2014-05-20 DIAGNOSIS — J189 Pneumonia, unspecified organism: Secondary | ICD-10-CM

## 2014-05-20 DIAGNOSIS — R197 Diarrhea, unspecified: Secondary | ICD-10-CM | POA: Diagnosis present

## 2014-05-20 DIAGNOSIS — G894 Chronic pain syndrome: Secondary | ICD-10-CM | POA: Diagnosis present

## 2014-05-20 DIAGNOSIS — Z87440 Personal history of urinary (tract) infections: Secondary | ICD-10-CM | POA: Diagnosis not present

## 2014-05-20 DIAGNOSIS — N10 Acute tubulo-interstitial nephritis: Secondary | ICD-10-CM | POA: Diagnosis not present

## 2014-05-20 DIAGNOSIS — I6931 Cognitive deficits following cerebral infarction: Secondary | ICD-10-CM | POA: Diagnosis not present

## 2014-05-20 DIAGNOSIS — Z515 Encounter for palliative care: Secondary | ICD-10-CM | POA: Diagnosis not present

## 2014-05-20 DIAGNOSIS — R4182 Altered mental status, unspecified: Secondary | ICD-10-CM | POA: Diagnosis not present

## 2014-05-20 DIAGNOSIS — E1151 Type 2 diabetes mellitus with diabetic peripheral angiopathy without gangrene: Secondary | ICD-10-CM | POA: Diagnosis present

## 2014-05-20 DIAGNOSIS — I129 Hypertensive chronic kidney disease with stage 1 through stage 4 chronic kidney disease, or unspecified chronic kidney disease: Secondary | ICD-10-CM | POA: Diagnosis present

## 2014-05-20 DIAGNOSIS — F329 Major depressive disorder, single episode, unspecified: Secondary | ICD-10-CM | POA: Diagnosis not present

## 2014-05-20 DIAGNOSIS — Z66 Do not resuscitate: Secondary | ICD-10-CM | POA: Diagnosis present

## 2014-05-20 DIAGNOSIS — I5032 Chronic diastolic (congestive) heart failure: Secondary | ICD-10-CM | POA: Diagnosis present

## 2014-05-20 DIAGNOSIS — B964 Proteus (mirabilis) (morganii) as the cause of diseases classified elsewhere: Secondary | ICD-10-CM | POA: Diagnosis present

## 2014-05-20 DIAGNOSIS — N184 Chronic kidney disease, stage 4 (severe): Secondary | ICD-10-CM | POA: Diagnosis present

## 2014-05-20 DIAGNOSIS — R627 Adult failure to thrive: Secondary | ICD-10-CM

## 2014-05-20 DIAGNOSIS — Z22322 Carrier or suspected carrier of Methicillin resistant Staphylococcus aureus: Secondary | ICD-10-CM | POA: Diagnosis not present

## 2014-05-20 DIAGNOSIS — N183 Chronic kidney disease, stage 3 unspecified: Secondary | ICD-10-CM | POA: Diagnosis present

## 2014-05-20 DIAGNOSIS — D631 Anemia in chronic kidney disease: Secondary | ICD-10-CM | POA: Diagnosis present

## 2014-05-20 DIAGNOSIS — F17220 Nicotine dependence, chewing tobacco, uncomplicated: Secondary | ICD-10-CM | POA: Diagnosis present

## 2014-05-20 DIAGNOSIS — Z789 Other specified health status: Secondary | ICD-10-CM

## 2014-05-20 DIAGNOSIS — B370 Candidal stomatitis: Secondary | ICD-10-CM | POA: Diagnosis present

## 2014-05-20 DIAGNOSIS — K219 Gastro-esophageal reflux disease without esophagitis: Secondary | ICD-10-CM | POA: Diagnosis present

## 2014-05-20 DIAGNOSIS — E11319 Type 2 diabetes mellitus with unspecified diabetic retinopathy without macular edema: Secondary | ICD-10-CM | POA: Diagnosis present

## 2014-05-20 DIAGNOSIS — F32A Depression, unspecified: Secondary | ICD-10-CM | POA: Diagnosis present

## 2014-05-20 DIAGNOSIS — I248 Other forms of acute ischemic heart disease: Secondary | ICD-10-CM | POA: Diagnosis present

## 2014-05-20 DIAGNOSIS — N12 Tubulo-interstitial nephritis, not specified as acute or chronic: Secondary | ICD-10-CM | POA: Diagnosis present

## 2014-05-20 DIAGNOSIS — A419 Sepsis, unspecified organism: Principal | ICD-10-CM | POA: Diagnosis present

## 2014-05-20 DIAGNOSIS — F0150 Vascular dementia without behavioral disturbance: Secondary | ICD-10-CM | POA: Diagnosis present

## 2014-05-20 DIAGNOSIS — F322 Major depressive disorder, single episode, severe without psychotic features: Secondary | ICD-10-CM | POA: Diagnosis present

## 2014-05-20 DIAGNOSIS — H547 Unspecified visual loss: Secondary | ICD-10-CM | POA: Diagnosis present

## 2014-05-20 DIAGNOSIS — N390 Urinary tract infection, site not specified: Secondary | ICD-10-CM | POA: Diagnosis present

## 2014-05-20 HISTORY — DX: Chronic diastolic (congestive) heart failure: I50.32

## 2014-05-20 HISTORY — DX: Pneumonia, unspecified organism: J18.9

## 2014-05-20 HISTORY — DX: Other chronic osteomyelitis, right ankle and foot: M86.671

## 2014-05-20 HISTORY — DX: Acute pyelonephritis: N10

## 2014-05-20 HISTORY — DX: Chronic kidney disease, stage 4 (severe): N18.4

## 2014-05-20 HISTORY — DX: Candidal stomatitis: B37.0

## 2014-05-20 HISTORY — DX: Carrier or suspected carrier of methicillin resistant Staphylococcus aureus: Z22.322

## 2014-05-20 LAB — CBC WITH DIFFERENTIAL/PLATELET
Basophils Absolute: 0 10*3/uL (ref 0.0–0.1)
Basophils Relative: 0 % (ref 0–1)
EOS ABS: 0 10*3/uL (ref 0.0–0.7)
Eosinophils Relative: 0 % (ref 0–5)
HEMATOCRIT: 24.5 % — AB (ref 36.0–46.0)
Hemoglobin: 7.5 g/dL — ABNORMAL LOW (ref 12.0–15.0)
Lymphocytes Relative: 11 % — ABNORMAL LOW (ref 12–46)
Lymphs Abs: 1.9 10*3/uL (ref 0.7–4.0)
MCH: 27.3 pg (ref 26.0–34.0)
MCHC: 30.6 g/dL (ref 30.0–36.0)
MCV: 89.1 fL (ref 78.0–100.0)
Monocytes Absolute: 0.5 10*3/uL (ref 0.1–1.0)
Monocytes Relative: 3 % (ref 3–12)
Neutro Abs: 14.1 10*3/uL — ABNORMAL HIGH (ref 1.7–7.7)
Neutrophils Relative %: 86 % — ABNORMAL HIGH (ref 43–77)
Platelets: 243 10*3/uL (ref 150–400)
RBC: 2.75 MIL/uL — ABNORMAL LOW (ref 3.87–5.11)
RDW: 19.1 % — ABNORMAL HIGH (ref 11.5–15.5)
WBC: 16.4 10*3/uL — ABNORMAL HIGH (ref 4.0–10.5)

## 2014-05-20 LAB — COMPREHENSIVE METABOLIC PANEL
ALT: 25 U/L (ref 0–35)
ANION GAP: 15 (ref 5–15)
AST: 111 U/L — ABNORMAL HIGH (ref 0–37)
Albumin: 1.8 g/dL — ABNORMAL LOW (ref 3.5–5.2)
Alkaline Phosphatase: 78 U/L (ref 39–117)
BUN: 38 mg/dL — AB (ref 6–23)
CALCIUM: 7.3 mg/dL — AB (ref 8.4–10.5)
CO2: 24 mmol/L (ref 19–32)
Chloride: 98 mmol/L (ref 96–112)
Creatinine, Ser: 2.31 mg/dL — ABNORMAL HIGH (ref 0.50–1.10)
GFR calc Af Amer: 23 mL/min — ABNORMAL LOW (ref >90)
GFR, EST NON AFRICAN AMERICAN: 20 mL/min — AB (ref >90)
GLUCOSE: 169 mg/dL — AB (ref 70–99)
POTASSIUM: 3.9 mmol/L (ref 3.5–5.1)
SODIUM: 137 mmol/L (ref 135–145)
TOTAL PROTEIN: 7.2 g/dL (ref 6.0–8.3)
Total Bilirubin: 1.4 mg/dL — ABNORMAL HIGH (ref 0.3–1.2)

## 2014-05-20 LAB — URINALYSIS, ROUTINE W REFLEX MICROSCOPIC
Bilirubin Urine: NEGATIVE
GLUCOSE, UA: NEGATIVE mg/dL
Hgb urine dipstick: NEGATIVE
KETONES UR: NEGATIVE mg/dL
Nitrite: NEGATIVE
PH: 8.5 — AB (ref 5.0–8.0)
Protein, ur: 100 mg/dL — AB
Specific Gravity, Urine: 1.02 (ref 1.005–1.030)
Urobilinogen, UA: 0.2 mg/dL (ref 0.0–1.0)

## 2014-05-20 LAB — BLOOD GAS, VENOUS
Acid-Base Excess: 2.1 mmol/L — ABNORMAL HIGH (ref 0.0–2.0)
Acid-base deficit: 1.9 mmol/L (ref 0.0–2.0)
Bicarbonate: 21.8 mEq/L (ref 20.0–24.0)
FIO2: 0.21 %
O2 Saturation: 83.5 %
PCO2 VEN: 34.3 mmHg — AB (ref 45.0–50.0)
PO2 VEN: 54.6 mmHg — AB (ref 30.0–45.0)
Patient temperature: 37
TCO2: 21 mmol/L (ref 0–100)
pH, Ven: 7.418 — ABNORMAL HIGH (ref 7.250–7.300)

## 2014-05-20 LAB — ABO/RH: ABO/RH(D): A POS

## 2014-05-20 LAB — CBG MONITORING, ED: GLUCOSE-CAPILLARY: 168 mg/dL — AB (ref 70–99)

## 2014-05-20 LAB — URINE MICROSCOPIC-ADD ON

## 2014-05-20 LAB — I-STAT CG4 LACTIC ACID, ED: Lactic Acid, Venous: 1.73 mmol/L (ref 0.5–2.0)

## 2014-05-20 LAB — TROPONIN I: Troponin I: 1.05 ng/mL (ref <0.031)

## 2014-05-20 MED ORDER — SODIUM CHLORIDE 0.9 % IV BOLUS (SEPSIS)
1000.0000 mL | INTRAVENOUS | Status: AC
  Administered 2014-05-20 (×2): 1000 mL via INTRAVENOUS

## 2014-05-20 MED ORDER — CEFEPIME HCL 1 G IJ SOLR
1.0000 g | INTRAMUSCULAR | Status: DC
  Administered 2014-05-21 – 2014-05-23 (×3): 1 g via INTRAVENOUS
  Filled 2014-05-20 (×4): qty 1

## 2014-05-20 MED ORDER — VANCOMYCIN HCL IN DEXTROSE 1-5 GM/200ML-% IV SOLN
1000.0000 mg | INTRAVENOUS | Status: DC
  Administered 2014-05-21 – 2014-05-23 (×2): 1000 mg via INTRAVENOUS
  Filled 2014-05-20 (×3): qty 200

## 2014-05-20 MED ORDER — SODIUM CHLORIDE 0.9 % IV BOLUS (SEPSIS)
500.0000 mL | INTRAVENOUS | Status: AC
  Administered 2014-05-20 (×2): 1000 mL via INTRAVENOUS

## 2014-05-20 MED ORDER — SODIUM CHLORIDE 0.9 % IV SOLN
INTRAVENOUS | Status: DC
  Administered 2014-05-21 – 2014-05-22 (×2): 1000 mL via INTRAVENOUS

## 2014-05-20 MED ORDER — CEFEPIME HCL 2 G IJ SOLR
2.0000 g | Freq: Once | INTRAMUSCULAR | Status: AC
  Administered 2014-05-20: 2 g via INTRAVENOUS
  Filled 2014-05-20: qty 2

## 2014-05-20 MED ORDER — CETYLPYRIDINIUM CHLORIDE 0.05 % MT LIQD
7.0000 mL | Freq: Two times a day (BID) | OROMUCOSAL | Status: DC
  Administered 2014-05-21 – 2014-05-23 (×6): 7 mL via OROMUCOSAL

## 2014-05-20 MED ORDER — CHLORHEXIDINE GLUCONATE 0.12 % MT SOLN
15.0000 mL | Freq: Two times a day (BID) | OROMUCOSAL | Status: DC
  Administered 2014-05-21 – 2014-05-25 (×9): 15 mL via OROMUCOSAL
  Filled 2014-05-20 (×11): qty 15

## 2014-05-20 MED ORDER — VANCOMYCIN HCL IN DEXTROSE 1-5 GM/200ML-% IV SOLN
1000.0000 mg | Freq: Once | INTRAVENOUS | Status: AC
  Administered 2014-05-20: 1000 mg via INTRAVENOUS
  Filled 2014-05-20: qty 200

## 2014-05-20 NOTE — ED Notes (Signed)
Delay on type and cross..the patient enroute to [place]

## 2014-05-20 NOTE — Progress Notes (Signed)
ANTIBIOTIC CONSULT NOTE - INITIAL  Pharmacy Consult for Vancomycin and Cefepime Indication: HCAP, Sepsis  No Known Allergies  Patient Measurements:   As of 04/07/14: Height: 4'10" Weight: 83 kg  Vital Signs: Temp: 102.9 F (39.4 C) (04/21 1943) Temp Source: Rectal (04/21 1943) BP: 73/43 mmHg (04/21 1943) Pulse Rate: 110 (04/21 1943) Intake/Output from previous day:   Intake/Output from this shift:    Labs: No results for input(s): WBC, HGB, PLT, LABCREA, CREATININE in the last 72 hours. CrCl cannot be calculated (Unknown ideal weight.). No results for input(s): VANCOTROUGH, VANCOPEAK, VANCORANDOM, GENTTROUGH, GENTPEAK, GENTRANDOM, TOBRATROUGH, TOBRAPEAK, TOBRARND, AMIKACINPEAK, AMIKACINTROU, AMIKACIN in the last 72 hours.   Microbiology: No results found for this or any previous visit (from the past 720 hour(s)).  Medical History: Past Medical History  Diagnosis Date  . Hypertension   . Diabetes mellitus   . Blindness   . TIA (transient ischemic attack)   . Anemia   . GERD (gastroesophageal reflux disease)   . Constipation   . Renal disorder 05/2012    acute Renal Failure  . SYNCOPE 05/19/2009    Annotation: multiple over past 1 yr  Qualifier: Diagnosis of  By: [name] MD, [name]    . CHOLECYSTECTOMY, HX OF 05/19/2009    Qualifier: Diagnosis of  By: [name] MD, [name]      Medications:  Scheduled:   Infusions:  . ceFEPime (MAXIPIME) IV    . sodium chloride     Followed by  . sodium chloride    . vancomycin     PRN:   Assessment: 74 yo female presents from [place] with fever, lethargy and AMS; CXR at [facility] showed pneumonia. Code sepsis was called and Pharmacy is consulted to dose vancomycin and cefepime for HCAP   Goal of Therapy:  Vancomycin trough level 15-20 mcg/ml  Cefepime dose per indication and renal function  Plan:   Cefepime 2g IV in ED, then 1g IV q24h  Vancomycin 1g IV in ED, then 1g IV q24h Check trough at steady state Follow up  renal function & cultures, clinical course  Peggyann Juba, PharmD, BCPS Pager: [phone] 05/20/2014,8:13 PM

## 2014-05-20 NOTE — ED Provider Notes (Signed)
CSN: [id number]     Arrival date & time 05/20/14  1941 History   First MD Initiated Contact with Patient 05/20/14 1946     Chief Complaint  Patient presents with  . Fever      The history is provided by the nursing home. No language interpreter was used.   [name] presents for evaluation of fever. Level V caveat due to altered mental status. History provided by nursing home nurse per report patient has had a steady decline over the last several weeks and has poor oral intake. She was noted to develop fever today with decrease in her mental status. At baseline she talks and is oriented and follows commands. Patient is blind. Patient is currently full code.  Past Medical History  Diagnosis Date  . Hypertension   . Diabetes mellitus   . Blindness   . TIA (transient ischemic attack)   . Anemia   . GERD (gastroesophageal reflux disease)   . Constipation   . Renal disorder 05/2012    acute Renal Failure  . SYNCOPE 05/19/2009    Annotation: multiple over past 1 yr  Qualifier: Diagnosis of  By: [name] MD, [name]    . CHOLECYSTECTOMY, HX OF 05/19/2009    Qualifier: Diagnosis of  By: [name] MD, [name]     Past Surgical History  Procedure Laterality Date  . Gallbladder surgery    . Esophageal dilation    . Cholecystectomy    . Amputation  02/18/2012    Procedure: AMPUTATION RAY;  Surgeon: [name], MD;  Location: [facility];  Service: Orthopedics;  Laterality: Right;  right second toe  . Tee without cardioversion  02/21/2012    Procedure: TRANSESOPHAGEAL ECHOCARDIOGRAM (TEE);  Surgeon: [name], MD;  Location: [facility] ENDOSCOPY;  Service: Cardiovascular;  Laterality: N/A;   spoke with [name] from [place] pt will arrive by 815ish( thy change shifts at [place])  . Tee without cardioversion  02/25/2012    Procedure: TRANSESOPHAGEAL ECHOCARDIOGRAM (TEE);  Surgeon: [name], MD;  Location: [place];  Service: Cardiovascular;  Laterality: N/A;  . Amputation Right 09/07/2013    Procedure:  AMPUTATION RAY;  Surgeon: [name], MD;  Location: [place];  Service: Orthopedics;  Laterality: Right;  Right Transmetatarsal Amputation   Family History  Problem Relation Age of Onset  . Heart failure Mother    History  Substance Use Topics  . Smoking status: Former Smoker -- 0 years    Quit date: 06/25/1999  . Smokeless tobacco: Current User    Types: Snuff  . Alcohol Use: No   OB History    No data available     Review of Systems  Unable to perform ROS     Allergies  Review of patient's allergies indicates no known allergies.  Home Medications   Prior to Admission medications   Medication Sig Start Date End Date Taking? Authorizing Provider  acetaminophen (TYLENOL) 325 MG tablet Take 650 mg by mouth every 6 (six) hours as needed for fever.     Historical Provider, MD  Amino Acids-Protein Hydrolys (PRO-STAT) LIQD Take 30 mLs by mouth 2 (two) times daily.    Historical Provider, MD  aspirin EC 325 MG tablet Take 325 mg by mouth daily.      Historical Provider, MD  Brinzolamide-Brimonidine [facility]) 1-0.2 % SUSP Place 1 drop into the left eye 2 (two) times daily.    Historical Provider, MD  carvedilol (COREG) 3.125 MG tablet Take 3.125 mg by mouth  2 (two) times daily with a meal.    Historical Provider, MD  collagenase (SANTYL) ointment Apply topically daily. 12/21/13   [name], MD  docusate sodium (COLACE) 100 MG capsule Take 100 mg by mouth 2 (two) times daily.    Historical Provider, MD  DULoxetine (CYMBALTA) 30 MG capsule Take 1 capsule (30 mg total) by mouth daily. 05/14/14   Gerlene Fee, NP  feeding supplement, ENSURE, (ENSURE) PUDG Take 1 Container by mouth 2 (two) times daily between meals. 01/07/14   [name], MD  feeding supplement, GLUCERNA SHAKE, (GLUCERNA SHAKE) LIQD Take 237 mLs by mouth 3 (three) times daily between meals. 03/12/14   [name], MD  hydrocortisone (ANUSOL-HC) 2.5 % rectal cream Place 1 application rectally 2 (two) times  daily as needed for hemorrhoids.     Historical Provider, MD  insulin aspart (NOVOLOG) 100 UNIT/ML injection 0-9 Units, Subcutaneous, 3 times daily with meals,  CBG < 70: implement hypoglycemia protocol CBG 70 - 120: 0 units CBG 121 - 150: 1 unit CBG 151 - 200: 2 units CBG 201 - 250: 3 units CBG 251 - 300: 5 units CBG 301 - 350: 7 units CBG 351 - 400: 9 units CBG > 400: call MD Patient taking differently: Inject 5 Units into the skin 3 (three) times daily with meals. 5 units prior to meals for cbg >=150 12/21/13   [name], MD  ipratropium-albuterol (DUONEB) 0.5-2.5 (3) MG/3ML SOLN Take 3 mLs by nebulization every 6 (six) hours as needed (wheezing).    Historical Provider, MD  metoCLOPramide (REGLAN) 5 MG tablet Take 1 tablet (5 mg total) by mouth 4 (four) times daily -  before meals and at bedtime. 12/22/13   [name], MD  Multiple Vitamins-Minerals (MULTIVITAMIN WITH MINERALS) tablet Take 1 tablet by mouth daily.    Historical Provider, MD  polyethylene glycol (MIRALAX / GLYCOLAX) packet Take 17 g by mouth daily. 06/18/12   Linton Flemings, MD  timolol (TIMOPTIC) 0.5 % ophthalmic solution Place 1 drop into the left eye 2 (two) times daily.    Historical Provider, MD   BP 73/43 mmHg  Pulse 110  Temp(Src) 102.9 F (39.4 C) (Rectal)  SpO2 100% Physical Exam  Constitutional: She appears well-developed and well-nourished. She appears distressed.  HENT:  Head: Atraumatic.  Eyes:  Right eye with corneal scarring, left pupil with cataract present.  Cardiovascular: Normal rate.   No murmur heard. Pulmonary/Chest:  Tachypnea with decreased air movement bilaterally  Abdominal: Soft. There is no tenderness. There is no rebound and no guarding.  Genitourinary:  Foley catheter in place  Musculoskeletal:  No appreciable edema  Neurological:  Eyes closed, medicines to painful stimuli and commands. Follows very basic one step commands such as opening mouth and sticking out tongue,  moving upper extremities weakly. Lethargic  Skin: Skin is warm and dry.  Psychiatric:  Unable to assess  Nursing note and vitals reviewed.   ED Course  Procedures (including critical care time) CRITICAL CARE Performed by: Quintella Reichert   Total critical care time: 30 minutes  Critical care time was exclusive of separately billable procedures and treating other patients.  Critical care was necessary to treat or prevent imminent or life-threatening deterioration.  Critical care was time spent personally by me on the following activities: development of treatment plan with patient and/or surrogate as well as nursing, discussions with consultants, evaluation of patient's response to treatment, examination of patient, obtaining history from patient or  surrogate, ordering and performing treatments and interventions, ordering and review of laboratory studies, ordering and review of radiographic studies, pulse oximetry and re-evaluation of patient's condition.  Labs Review Labs Reviewed  CBC WITH DIFFERENTIAL/PLATELET - Abnormal; Notable for the following:    WBC 16.4 (*)    RBC 2.75 (*)    Hemoglobin 7.5 (*)    HCT 24.5 (*)    RDW 19.1 (*)    Neutrophils Relative % 86 (*)    Neutro Abs 14.1 (*)    Lymphocytes Relative 11 (*)    All other components within normal limits  COMPREHENSIVE METABOLIC PANEL - Abnormal; Notable for the following:    Glucose, Bld 169 (*)    BUN 38 (*)    Creatinine, Ser 2.31 (*)    Calcium 7.3 (*)    Albumin 1.8 (*)    AST 111 (*)    Total Bilirubin 1.4 (*)    GFR calc non Af Amer 20 (*)    GFR calc Af Amer 23 (*)    All other components within normal limits  URINALYSIS, ROUTINE W REFLEX MICROSCOPIC - Abnormal; Notable for the following:    Color, Urine RED (*)    APPearance TURBID (*)    pH 8.5 (*)    Protein, ur 100 (*)    Leukocytes, UA LARGE (*)    All other components within normal limits  BLOOD GAS, VENOUS - Abnormal; Notable for the  following:    pH, Ven 7.418 (*)    pCO2, Ven 34.3 (*)    pO2, Ven 54.6 (*)    Acid-Base Excess 2.1 (*)    All other components within normal limits  TROPONIN I - Abnormal; Notable for the following:    Troponin I 1.05 (*)    All other components within normal limits  URINE MICROSCOPIC-ADD ON - Abnormal; Notable for the following:    Bacteria, UA MANY (*)    Casts HYALINE CASTS (*)    Crystals BILIRUBIN CRYSTALS (*)    All other components within normal limits  CBG MONITORING, ED - Abnormal; Notable for the following:    Glucose-Capillary 168 (*)    All other components within normal limits  CULTURE, BLOOD (ROUTINE X 2)  CULTURE, BLOOD (ROUTINE X 2)  URINE CULTURE  I-STAT CG4 LACTIC ACID, ED  I-STAT CG4 LACTIC ACID, ED  TYPE AND SCREEN  ABO/RH    Imaging Review Ct Head Wo Contrast  05/20/2014   CLINICAL DATA:  Fever, lethargy, and altered mental status. History of dementia.  EXAM: CT HEAD WITHOUT CONTRAST  TECHNIQUE: Contiguous axial images were obtained from the base of the skull through the vertex without intravenous contrast.  COMPARISON:  03/02/2014  FINDINGS: There is no evidence of acute cortical infarct, intracranial hemorrhage, mass, midline shift, or extra-axial fluid collection. Moderate to severe, diffuse enlargement of the ventricles is unchanged. Chronic right frontal encephalomalacia is unchanged, as is patchy hypodensity in the right greater than left thalami. Small, focal hypodensity in posterior left frontal lobe is also unchanged. Periventricular white matter hypodensities are unchanged and may reflect chronic small vessel ischemia and/or transependymal CSF flow.  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 There is a chronic, large left mastoid effusion. There is mild posterior ethmoid air cell mucosal thickening, left greater than right, with right greater than left sphenoid sinus fluid and mucosal thickening also present. Extensive carotid  siphon calcification is noted.  IMPRESSION: 1. No evidence of acute intracranial abnormality. 2.  Unchanged hydrocephalus. 3. Paranasal sinus mucosal thickening and fluid. Correlate clinically for acute sinusitis.   Electronically Signed   By: Logan Bores   On: 05/20/2014 21:38   Dg Chest Port 1 View  05/20/2014   CLINICAL DATA:  Fever, lethargy and altered mental status. Question of pneumonia on chest x-ray at nursing home.  EXAM: PORTABLE CHEST - 1 VIEW  COMPARISON:  03/05/2014 and 03/01/2014  FINDINGS: Patient is rotated to the left. Lungs are hypoinflated and demonstrate prominence of the perihilar markings bilaterally with focal airspace density over the left mid to upper lung. No evidence of effusion. Mild stable cardiomegaly. There is calcified plaque over the aortic arch. Remainder of the exam is unchanged.  IMPRESSION: Focal opacification over the left mid upper lung which may be due to a pneumonia. Additional findings suggesting mild vascular congestion with stable cardiomegaly.   Electronically Signed   By: Marin Olp M.D.   On: 05/20/2014 20:59     EKG Interpretation   Date/Time:  Thursday May 20 2014 19:51:34 EDT Ventricular Rate:  89 PR Interval:    QRS Duration: 152 QT Interval:  423 QTC Calculation: 515 R Axis:   -77 Text Interpretation:  Atrial fibrillation Right bundle branch block Left  ventricular hypertrophy Anterior infarct, old Confirmed by Hazle Coca  [phone]) on 05/20/2014 8:37:06 PM      MDM   Final diagnoses:  Sepsis, due to unspecified organism  HCAP (healthcare-associated pneumonia)  Altered mental status, unspecified altered mental status type    Patient here for evaluation of fever and altered mental status. Patient's power of attorney is [employer]. Currently she is full code, but they are working on getting this reviewed to see if she can be transitioned to DO NOT RESUSCITATE based on patient's preferences. Patient is septic on evaluation with pneumonia.  Troponin is elevated, uncertain significance given her chronic kidney disease. Patient was markedly hypotensive on initial evaluation but blood pressure did improve after IV fluid administration. Plan to admit for further management.    Quintella Reichert, MD 05/20/14 [phone]

## 2014-05-20 NOTE — Clinical Social Work Note (Signed)
Clinical Social Work Assessment  Patient Details  Name: [name] MRN: [id number] Date of Birth: 09/18/1940  Date of referral:  05/20/14               Reason for consult:   (CSW was consulted for pt due to her being from  [place].)                Permission sought to share information with:    Permission granted to share information::  No  Name::        Agency::     Relationship::     Contact Information:     Housing/Transportation Living arrangements for the past 2 months:  [place] of Information:  Adult Children (Patient was not communicative at bedside. CSW was not able to speak with pt. ) Patient Interpreter Needed:  None Criminal Activity/Legal Involvement Pertinent to Current Situation/Hospitalization:  No - Comment as needed Significant Relationships:  Adult Children (Daughter informed CSW that she is the pt's primary support. ) Lives with:  Facility Resident Do you feel safe going back to the place where you live?   (The pt was not communicative. However, daughter states that the pt does receive assistance with all ADL's while at the facility. ) Need for family participation in patient care:  Yes (Comment) (CSW feels that it would be helpful if the pt does have family support. Daughter states that she  currently visits the pt at least x1 a week. )  Care giving concerns:  Daughter states that her concern is the pt's general well being at this time.   Social Worker assessment / plan:  CSW attempted to speak with pt at bedside. However, she was not communicative and unable to answer questions. Daughter was present. Daughter confirms that the pt is from [facility]. She states that the pt has been living there for the past 3 years.  She states the facility called EMS due to her having a fever. Daughter states that she is not sure how long the pt has had a fever. Per note, the pt presents to [facility] due to fever, lethargy, and altered mental status.  Also, note states that the pt has a chest x-ray today at the facility which showed pneumonia.  Daughter confirms that the pt has a hx of dementia. Daughter states that the pt does not fall often. Also, she states that the pt receives assistance with completing her ADL's.   Daughter states that the pt receives social security as a form of income at this time.  Daughter states that she is the pt's primary support. She informed CSW that she lives in [place], and visits the pt at least x1 a week. She also states that the pt has siblings who visit her.   Daughter appears to be a great support for pt and states that she is just concerned about the pt's general health and well being at this time.   Daughter states that she has no questions at this time. CSW informed daughter to notify a nurse if she would like to speak with CSW again or have any questions regarding resources.    [name] [phone]  Employment status:  Unemployed Insurance information:  Medicare PT Recommendations:   (The pt should remain at ALF.) Information / Referral to community resources:     Patient/Family's Response to care:  Daughter was pleasant and understands that the pt presents to [facility] due to a fever. She appears to  be a great support for pt.   Patient/Family's Understanding of and Emotional Response to Diagnosis, Current Treatment, and Prognosis:  Daughter was pleasant and understands that the pt presents to [facility] due to a fever.  Emotional Assessment Appearance:  Appears older than stated age Attitude/Demeanor/Rapport:  Unable to Assess Affect (typically observed):  Other (Asleep.) Orientation:    Alcohol / Substance use:    Psych involvement (Current and /or in the community):  No (Comment)  Discharge Needs  Concerns to be addressed:  Adjustment to Illness Readmission within the last 30 days:  No Current discharge risk:  None Barriers to Discharge:  No Barriers Identified   Bernita Buffy, LCSW 05/20/2014, 11:06 PM

## 2014-05-20 NOTE — H&P (Addendum)
PCP:   Gildardo Cranker, DO   Chief Complaint:  Ams, fever  HPI: 74 yo female from SNF sent in for AMS and fever, FTT for months, not eating and drinking well.  Pt is ward of the state, has a designated Education officer, museum.  EDP called SW tonight, to confirm pt is full code, DNR is in process but not final yet.  Pt was hypotensive, febrile, encephalopathic on arrival.  She has received sepsis protocol bolus of ivf along with broad spectrum abx iv and has responded well with improvement of her mental status some and her bp.  Pt cannot still provide much history.  All history obtained from ED staff.  Review of Systems:  Unobtainable  Past Medical History: Past Medical History  Diagnosis Date  . Hypertension   . Diabetes mellitus   . Blindness   . TIA (transient ischemic attack)   . Anemia   . GERD (gastroesophageal reflux disease)   . Constipation   . Renal disorder 05/2012    acute Renal Failure  . SYNCOPE 05/19/2009    Annotation: multiple over past 1 yr  Qualifier: Diagnosis of  By: [name] MD, [name]    . CHOLECYSTECTOMY, HX OF 05/19/2009    Qualifier: Diagnosis of  By: [name] MD, [name]     Past Surgical History  Procedure Laterality Date  . Gallbladder surgery    . Esophageal dilation    . Cholecystectomy    . Amputation  02/18/2012    Procedure: AMPUTATION RAY;  Surgeon: [name], MD;  Location: [facility];  Service: Orthopedics;  Laterality: Right;  right second toe  . Tee without cardioversion  02/21/2012    Procedure: TRANSESOPHAGEAL ECHOCARDIOGRAM (TEE);  Surgeon: [name], MD;  Location: [facility] ENDOSCOPY;  Service: Cardiovascular;  Laterality: N/A;   spoke with [name] from [place] pt will arrive by 815ish( thy change shifts at [facility])  . Tee without cardioversion  02/25/2012    Procedure: TRANSESOPHAGEAL ECHOCARDIOGRAM (TEE);  Surgeon: [name], MD;  Location: [place];  Service: Cardiovascular;  Laterality: N/A;  . Amputation Right 09/07/2013    Procedure: AMPUTATION  RAY;  Surgeon: [name], MD;  Location: [place];  Service: Orthopedics;  Laterality: Right;  Right Transmetatarsal Amputation    Medications: Prior to Admission medications   Medication Sig Start Date End Date Taking? Authorizing Provider  acetaminophen (TYLENOL) 325 MG tablet Take 650 mg by mouth 3 (three) times daily.    Yes Historical Provider, MD  Amino Acids-Protein Hydrolys (PRO-STAT) LIQD Take 30 mLs by mouth 2 (two) times daily.   Yes Historical Provider, MD  aspirin EC 325 MG tablet Take 325 mg by mouth daily.     Yes Historical Provider, MD  Brinzolamide-Brimonidine [facility]) 1-0.2 % SUSP Place 1 drop into the left eye 2 (two) times daily.   Yes Historical Provider, MD  Calcium Carbonate-Vitamin D (TGT CALCIUM DIETARY SUPPLEMENT PO) Take 2 tablets by mouth 3 (three) times daily.   Yes Historical Provider, MD  carvedilol (COREG) 3.125 MG tablet Take 3.125 mg by mouth 2 (two) times daily with a meal.   Yes Historical Provider, MD  docusate sodium (COLACE) 100 MG capsule Take 100 mg by mouth 2 (two) times daily.   Yes Historical Provider, MD  metoCLOPramide (REGLAN) 5 MG tablet Take 1 tablet (5 mg total) by mouth 4 (four) times daily -  before meals and at bedtime. 12/22/13  Yes [name], MD  mirtazapine (REMERON) 30 MG tablet  Take 30 mg by mouth at bedtime.   Yes Historical Provider, MD  Multiple Vitamins-Minerals (DECUBI-VITE) CAPS Take 2 capsules by mouth daily.   Yes Historical Provider, MD  polyethylene glycol (MIRALAX / GLYCOLAX) packet Take 17 g by mouth daily. 06/18/12  Yes Linton Flemings, MD  promethazine (PHENERGAN) 25 MG tablet Take 25 mg by mouth every 6 (six) hours as needed for nausea or vomiting.   Yes Historical Provider, MD  sodium chloride (OCEAN) 0.65 % SOLN nasal spray Place 1 spray into both nostrils every 6 (six) hours as needed for congestion.   Yes Historical Provider, MD  timolol (TIMOPTIC) 0.5 % ophthalmic solution Place 1 drop into the left eye 2 (two)  times daily.   Yes Historical Provider, MD  collagenase (SANTYL) ointment Apply topically daily. Patient not taking: Reported on 05/20/2014 12/21/13   Jonetta Osgood, MD  DULoxetine (CYMBALTA) 30 MG capsule Take 1 capsule (30 mg total) by mouth daily. Patient not taking: Reported on 05/20/2014 05/14/14   Gerlene Fee, NP  feeding supplement, ENSURE, (ENSURE) PUDG Take 1 Container by mouth 2 (two) times daily between meals. Patient not taking: Reported on 05/20/2014 01/07/14   [name], MD  feeding supplement, GLUCERNA SHAKE, (GLUCERNA SHAKE) LIQD Take 237 mLs by mouth 3 (three) times daily between meals. Patient not taking: Reported on 05/20/2014 03/12/14   Jonetta Osgood, MD  hydrocortisone (ANUSOL-HC) 2.5 % rectal cream Place 1 application rectally 2 (two) times daily as needed for hemorrhoids.     Historical Provider, MD  insulin aspart (NOVOLOG) 100 UNIT/ML injection 0-9 Units, Subcutaneous, 3 times daily with meals,  CBG < 70: implement hypoglycemia protocol CBG 70 - 120: 0 units CBG 121 - 150: 1 unit CBG 151 - 200: 2 units CBG 201 - 250: 3 units CBG 251 - 300: 5 units CBG 301 - 350: 7 units CBG 351 - 400: 9 units CBG > 400: call MD Patient not taking: Reported on 05/20/2014 12/21/13   [name], MD  ipratropium-albuterol (DUONEB) 0.5-2.5 (3) MG/3ML SOLN Take 3 mLs by nebulization every 6 (six) hours as needed (wheezing).    Historical Provider, MD    Allergies:  No Known Allergies  Social History:  reports that she quit smoking about 14 years ago. Her smokeless tobacco use includes Snuff. She reports that she does not drink alcohol or use illicit drugs.  Family History: Family History  Problem Relation Age of Onset  . Heart failure Mother     Physical Exam: Filed Vitals:   05/20/14 2030 05/20/14 2045 05/20/14 2100 05/20/14 2109  BP: 97/43 96/44 101/43   Pulse: 74 66 68   Temp:      TempSrc:      Resp: 18 15 18    Height:    5' (1.524 m)  Weight:       SpO2: 100% 100% 100%    General appearance: no distress and slowed mentation Head: Normocephalic, without obvious abnormality, atraumatic Eyes: negative Nose: Nares normal. Septum midline. Mucosa normal. No drainage or sinus tenderness. Neck: no JVD and supple, symmetrical, trachea midline Lungs: clear to auscultation bilaterally Heart: regular rate and rhythm, S1, S2 normal, no murmur, click, rub or gallop Abdomen: soft, non-tender; bowel sounds normal; no masses,  no organomegaly Extremities: extremities normal, atraumatic, no cyanosis or edema Pulses: 2+ and symmetric Skin: Skin color, texture, turgor normal. No rashes or lesions Neurologic: Cranial nerves: normal  ble atrophied   Labs on Admission:   Recent  Labs  05/20/14 2032  NA 137  K 3.9  CL 98  CO2 24  GLUCOSE 169*  BUN 38*  CREATININE 2.31*  CALCIUM 7.3*    Recent Labs  05/20/14 2032  AST 111*  ALT 25  ALKPHOS 78  BILITOT 1.4*  PROT 7.2  ALBUMIN 1.8*    Recent Labs  05/20/14 2032  WBC 16.4*  NEUTROABS 14.1*  HGB 7.5*  HCT 24.5*  MCV 89.1  PLT 243    Recent Labs  05/20/14 2032  TROPONINI 1.05*   Radiological Exams on Admission: Ct Head Wo Contrast  05/20/2014   CLINICAL DATA:  Fever, lethargy, and altered mental status. History of dementia.  EXAM: CT HEAD WITHOUT CONTRAST  TECHNIQUE: Contiguous axial images were obtained from the base of the skull through the vertex without intravenous contrast.  COMPARISON:  03/02/2014  FINDINGS: There is no evidence of acute cortical infarct, intracranial hemorrhage, mass, midline shift, or extra-axial fluid collection. Moderate to severe, diffuse enlargement of the ventricles is unchanged. Chronic right frontal encephalomalacia is unchanged, as is patchy hypodensity in the right greater than left thalami. Small, focal hypodensity in posterior left frontal lobe is also unchanged. Periventricular white matter hypodensities are unchanged and may reflect chronic  small vessel ischemia and/or transependymal CSF flow.  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 There is a chronic, large left mastoid effusion. There is mild posterior ethmoid air cell mucosal thickening, left greater than right, with right greater than left sphenoid sinus fluid and mucosal thickening also present. Extensive carotid siphon calcification is noted.  IMPRESSION: 1. No evidence of acute intracranial abnormality. 2. Unchanged hydrocephalus. 3. Paranasal sinus mucosal thickening and fluid. Correlate clinically for acute sinusitis.   Electronically Signed   By: Logan Bores   On: 05/20/2014 21:38   Dg Chest Port 1 View  05/20/2014   CLINICAL DATA:  Fever, lethargy and altered mental status. Question of pneumonia on chest x-ray at nursing home.  EXAM: PORTABLE CHEST - 1 VIEW  COMPARISON:  03/05/2014 and 03/01/2014  FINDINGS: Patient is rotated to the left. Lungs are hypoinflated and demonstrate prominence of the perihilar markings bilaterally with focal airspace density over the left mid to upper lung. No evidence of effusion. Mild stable cardiomegaly. There is calcified plaque over the aortic arch. Remainder of the exam is unchanged.  IMPRESSION: Focal opacification over the left mid upper lung which may be due to a pneumonia. Additional findings suggesting mild vascular congestion with stable cardiomegaly.   Electronically Signed   By: Marin Olp M.D.   On: 05/20/2014 20:59    Assessment/Plan  74 yo female with sepsis from uti and HCAP  Principal Problem:   Sepsis-  sbp improved with ivf bolus.  pna pathway, iv vanco and cefepime.  Pancultured.  Stepdown overnight.    Active Problems:   Blindness   Chronic renal failure, stage 3 (moderate)-  Stable at baseline.   UTI (lower urinary tract infection)-  As above   Acute encephalopathy-  Due to above   Chronic diastolic CHF (congestive heart failure)-  Compensated at this time.  All cardiac meds on hold, does  not appear to be on chronic diuretics.   FTT (failure to thrive) in adult-  Noted, palliative care has been involved in the past.   HCAP (healthcare-associated pneumonia)-  As above   Positive troponin-  Serial enzymes, likely demand ischemia, pt poor candidate for any aggressive intervention at this time, cardiology has been called  by dr Vanita Panda and will be seeing in consultation for this positive troponin.  Full code per SW with DSS.  Admit to stepdown.  DAVID,RACHAL A 05/20/2014, 10:00 PM  Error on above about cardiology consult, entered on wrong patient, however her troponin is positive, cardiology has not been called.

## 2014-05-20 NOTE — ED Notes (Signed)
Daughter at the bedside

## 2014-05-20 NOTE — ED Notes (Signed)
Bed: WA09 Expected date: 05/20/14 Expected time: 7:26 PM Means of arrival: Ambulance Comments: Lethargic, fever

## 2014-05-20 NOTE — Progress Notes (Signed)
CSW attempted to speak with pt at bedside. However, she was not communicative and unable to answer questions. Daughter was present. Daughter confirms that the pt is from [facility]. She states that the pt has been living there for the past 3 years.   She states the facility called EMS due to her having a fever. Daughter states that she is not sure how long the pt has had a fever. Per note, the pt presents to [facility] due to fever, lethargy, and altered mental status. Also, note states that the pt has a chest x-ray today at the facility which showed pneumonia.  Daughter confirms that the pt has a hx of dementia. Daughter states that the pt does not fall often. Also, she states that the pt receives assistance with completing her ADL's.   Daughter states that the pt receives social security as a form of income at this time.  Daughter states that she is the pt's primary support. She informed CSW that she lives in [place], and visits the pt at least x1 a week. She also states that the pt has siblings who visit her.   Daughter appears to be a great support for pt and states that she is just concerned about the pt's general health and well being at this time.   Daughter states that she has no questions at this time. CSW informed daughter to notify a nurse if she would like to speak with CSW again or have any questions regarding resources.    Daughter/Dana Johnson [phone]  Willette Brace [phone] ED CSW 05/20/2014 9:49 PM

## 2014-05-20 NOTE — ED Notes (Signed)
critcal troponin: 1.05.  Called by Estill Bamberg in main lab.  RN notfied

## 2014-05-20 NOTE — ED Notes (Signed)
Patient from [facility] for fever/lethargy/altered mental status.  Patient had chest xray today at nursing home which showed pneumonia.  Per EMS staff at nursing home reports altered mental status from baseline.  Patient has history of dementia.

## 2014-05-21 ENCOUNTER — Encounter (HOSPITAL_COMMUNITY): Payer: Self-pay | Admitting: Internal Medicine

## 2014-05-21 DIAGNOSIS — E1159 Type 2 diabetes mellitus with other circulatory complications: Secondary | ICD-10-CM

## 2014-05-21 DIAGNOSIS — N184 Chronic kidney disease, stage 4 (severe): Secondary | ICD-10-CM

## 2014-05-21 DIAGNOSIS — Z22322 Carrier or suspected carrier of Methicillin resistant Staphylococcus aureus: Secondary | ICD-10-CM

## 2014-05-21 HISTORY — DX: Chronic kidney disease, stage 4 (severe): N18.4

## 2014-05-21 HISTORY — DX: Carrier or suspected carrier of methicillin resistant Staphylococcus aureus: Z22.322

## 2014-05-21 LAB — CBC WITH DIFFERENTIAL/PLATELET
BASOS ABS: 0 10*3/uL (ref 0.0–0.1)
Basophils Relative: 0 % (ref 0–1)
Eosinophils Absolute: 0 10*3/uL (ref 0.0–0.7)
Eosinophils Relative: 0 % (ref 0–5)
HCT: 21.5 % — ABNORMAL LOW (ref 36.0–46.0)
Hemoglobin: 6.7 g/dL — CL (ref 12.0–15.0)
Lymphocytes Relative: 9 % — ABNORMAL LOW (ref 12–46)
Lymphs Abs: 1.6 10*3/uL (ref 0.7–4.0)
MCH: 27.7 pg (ref 26.0–34.0)
MCHC: 31.2 g/dL (ref 30.0–36.0)
MCV: 88.8 fL (ref 78.0–100.0)
MONO ABS: 0.6 10*3/uL (ref 0.1–1.0)
Monocytes Relative: 3 % (ref 3–12)
NEUTROS ABS: 15.8 10*3/uL — AB (ref 1.7–7.7)
Neutrophils Relative %: 88 % — ABNORMAL HIGH (ref 43–77)
PLATELETS: 225 10*3/uL (ref 150–400)
RBC: 2.42 MIL/uL — ABNORMAL LOW (ref 3.87–5.11)
RDW: 19.3 % — AB (ref 11.5–15.5)
WBC: 18 10*3/uL — ABNORMAL HIGH (ref 4.0–10.5)

## 2014-05-21 LAB — BASIC METABOLIC PANEL
ANION GAP: 13 (ref 5–15)
BUN: 37 mg/dL — ABNORMAL HIGH (ref 6–23)
CALCIUM: 6.5 mg/dL — AB (ref 8.4–10.5)
CO2: 18 mmol/L — ABNORMAL LOW (ref 19–32)
CREATININE: 2.11 mg/dL — AB (ref 0.50–1.10)
Chloride: 104 mmol/L (ref 96–112)
GFR calc Af Amer: 26 mL/min — ABNORMAL LOW (ref >90)
GFR calc non Af Amer: 22 mL/min — ABNORMAL LOW (ref >90)
Glucose, Bld: 211 mg/dL — ABNORMAL HIGH (ref 70–99)
Potassium: 3.6 mmol/L (ref 3.5–5.1)
SODIUM: 135 mmol/L (ref 135–145)

## 2014-05-21 LAB — GLUCOSE, CAPILLARY
GLUCOSE-CAPILLARY: 210 mg/dL — AB (ref 70–99)
GLUCOSE-CAPILLARY: 154 mg/dL — AB (ref 70–99)
Glucose-Capillary: 180 mg/dL — ABNORMAL HIGH (ref 70–99)
Glucose-Capillary: 136 mg/dL — ABNORMAL HIGH (ref 70–99)

## 2014-05-21 LAB — MRSA PCR SCREENING: MRSA BY PCR: POSITIVE — AB

## 2014-05-21 LAB — PREPARE RBC (CROSSMATCH)

## 2014-05-21 LAB — STREP PNEUMONIAE URINARY ANTIGEN: Strep Pneumo Urinary Antigen: NEGATIVE

## 2014-05-21 LAB — CLOSTRIDIUM DIFFICILE BY PCR: Toxigenic C. Difficile by PCR: NEGATIVE

## 2014-05-21 MED ORDER — SODIUM CHLORIDE 0.9 % IV BOLUS (SEPSIS)
250.0000 mL | Freq: Once | INTRAVENOUS | Status: AC
  Administered 2014-05-21: 250 mL via INTRAVENOUS

## 2014-05-21 MED ORDER — ENSURE ENLIVE PO LIQD
237.0000 mL | Freq: Two times a day (BID) | ORAL | Status: DC
  Administered 2014-05-21 – 2014-05-22 (×2): 237 mL via ORAL

## 2014-05-21 MED ORDER — SODIUM CHLORIDE 0.9 % IV SOLN: Freq: Once | INTRAVENOUS | Status: DC

## 2014-05-21 MED ORDER — ONDANSETRON HCL 4 MG/2ML IJ SOLN
4.0000 mg | Freq: Four times a day (QID) | INTRAMUSCULAR | Status: DC | PRN
  Administered 2014-05-21: 4 mg via INTRAVENOUS
  Filled 2014-05-21: qty 2

## 2014-05-21 MED ORDER — MUPIROCIN 2 % EX OINT
TOPICAL_OINTMENT | Freq: Two times a day (BID) | CUTANEOUS | Status: DC
  Administered 2014-05-21 (×2): via NASAL
  Administered 2014-05-22 – 2014-05-23 (×3): 1 via NASAL
  Administered 2014-05-23 – 2014-05-24 (×2): via NASAL
  Administered 2014-05-24: 1 via NASAL
  Administered 2014-05-25: 10:00:00 via NASAL
  Filled 2014-05-21 (×2): qty 22

## 2014-05-21 MED ORDER — HYDROCODONE-ACETAMINOPHEN 5-325 MG PO TABS
1.0000 | ORAL_TABLET | ORAL | Status: DC | PRN
  Administered 2014-05-21 – 2014-05-22 (×2): 1 via ORAL
  Filled 2014-05-21 (×2): qty 1

## 2014-05-21 MED ORDER — INSULIN ASPART 100 UNIT/ML ~~LOC~~ SOLN
0.0000 [IU] | SUBCUTANEOUS | Status: DC
  Administered 2014-05-21 (×2): 2 [IU] via SUBCUTANEOUS
  Administered 2014-05-21: 1 [IU] via SUBCUTANEOUS
  Administered 2014-05-21: 3 [IU] via SUBCUTANEOUS
  Administered 2014-05-22: 2 [IU] via SUBCUTANEOUS

## 2014-05-21 MED ORDER — CHLORHEXIDINE GLUCONATE CLOTH 2 % EX PADS
6.0000 | MEDICATED_PAD | Freq: Every day | CUTANEOUS | Status: DC
  Administered 2014-05-21 – 2014-05-22 (×2): 6 via TOPICAL

## 2014-05-21 NOTE — Progress Notes (Signed)
INITIAL NUTRITION ASSESSMENT  DOCUMENTATION CODES Per approved criteria  -Obesity Unspecified   INTERVENTION: - Ensure Enlive po BID, each supplement provides 350 kcal and 20 grams of protein - RD will continue to monitor  NUTRITION DIAGNOSIS: Inadequate oral intake related to altered mental status as evidenced by poor po.   Goal: Pt to meet >/= 90% of their estimated nutrition needs   Monitor:  Weight trend, diet advancement, labs  Reason for Assessment: Malnutrition Screening Tool  74 y.o. female  Admitting Dx: Sepsis  ASSESSMENT: 74 y.o. female with a PMH of diabetes with circulatory complications and retinopathy, recurrent UTI, stage IV chronic kidney disease with a baseline creatinine of 2-2.5, failure to thrive with recent hospitalization 03/01/14-03/12/14 for treatment of acute encephalopathy related to UTI and acute on chronic renal failure with recommendations for outpatient palliative care/hospice follow-up upon discharge, was admitted 05/20/14 with altered mental status and fever, treated as a code sepsis on admission.  - Pt with altered mental status. Nutritional history obtained from chart and RN.  - 8 lb wt loss in the past 1-1/2 months. Diet upgraded to regular. Pt has not yet had anything to eat.  - No signs of fat or muscle depletion.  - Unsure if pt was eating well prior to admission.  - Labs and medications reviewed  Height: Ht Readings from Last 1 Encounters:  05/21/14 5\' 3"  (1.6 m)    Weight: Wt Readings from Last 1 Encounters:  05/21/14 175 lb 4.3 oz (79.5 kg)    Ideal Body Weight: 52.4 kg  % Ideal Body Weight: 152%  Wt Readings from Last 10 Encounters:  05/21/14 175 lb 4.3 oz (79.5 kg)  04/07/14 183 lb (83.008 kg)  03/11/14 202 lb 9.6 oz (91.9 kg)  02/10/14 181 lb (82.101 kg)  01/19/14 189 lb (85.73 kg)  01/08/14 191 lb (86.637 kg)  01/07/14 192 lb 14.4 oz (87.5 kg)  12/23/13 199 lb (90.266 kg)  12/22/13 212 lb 4.8 oz (96.299 kg)  09/30/13  209 lb (94.802 kg)    Usual Body Weight: unknown  % Usual Body Weight: n/a  BMI:  Body mass index is 31.05 kg/(m^2).  Estimated Nutritional Needs: Kcal: 1600-1800 Protein: 105-115 g Fluid: 1.6-1.8 L/day  Skin: stage II pressure ulcer on buttocks  Diet Order: Diet regular Room service appropriate?: Yes with Assist; Fluid consistency:: Thin  EDUCATION NEEDS: -No education needs identified at this time   Intake/Output Summary (Last 24 hours) at 05/21/14 1347 Last data filed at 05/21/14 1300  Gross per 24 hour  Intake 1121.25 ml  Output     30 ml  Net 1091.25 ml    Last BM: 4/22   Labs:   Recent Labs Lab 05/20/14 2032 05/21/14 0330  NA 137 135  K 3.9 3.6  CL 98 104  CO2 24 18*  BUN 38* 37*  CREATININE 2.31* 2.11*  CALCIUM 7.3* 6.5*  GLUCOSE 169* 211*    CBG (last 3)   Recent Labs  05/20/14 2006 05/21/14 0831  GLUCAP 168* 180*    Scheduled Meds: . sodium chloride   Intravenous Once  . antiseptic oral rinse  7 mL Mouth Rinse q12n4p  . ceFEPime (MAXIPIME) IV  1 g Intravenous Q24H  . chlorhexidine  15 mL Mouth Rinse BID  . Chlorhexidine Gluconate Cloth  6 each Topical Q0600  . insulin aspart  0-9 Units Subcutaneous 6 times per day  . mupirocin ointment   Nasal BID  . vancomycin  1,000 mg Intravenous Q24H  Continuous Infusions: . sodium chloride 1,000 mL (05/21/14 0131)    Past Medical History  Diagnosis Date  . Hypertension   . Diabetes mellitus   . Blindness   . TIA (transient ischemic attack)   . Anemia   . GERD (gastroesophageal reflux disease)   . Constipation   . Renal disorder 05/2012    acute Renal Failure  . SYNCOPE 05/19/2009    Annotation: multiple over past 1 yr  Qualifier: Diagnosis of  By: [name] MD, [name]    . CHOLECYSTECTOMY, HX OF 05/19/2009    Qualifier: Diagnosis of  By: [name] MD, [name]    . 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 Chronic osteomyelitis of toe of right foot 09/19/2013    3rd, 4th, 5th     Past Surgical History   Procedure Laterality Date  . Gallbladder surgery    . Esophageal dilation    . Cholecystectomy    . Amputation  02/18/2012    Procedure: AMPUTATION RAY;  Surgeon: [name], MD;  Location: [facility];  Service: Orthopedics;  Laterality: Right;  right second toe  . Tee without cardioversion  02/21/2012    Procedure: TRANSESOPHAGEAL ECHOCARDIOGRAM (TEE);  Surgeon: [name], MD;  Location: [facility] ENDOSCOPY;  Service: Cardiovascular;  Laterality: N/A;   spoke with [name] from [place] pt will arrive by 815ish( thy change shifts at [facility])  . Tee without cardioversion  02/25/2012    Procedure: TRANSESOPHAGEAL ECHOCARDIOGRAM (TEE);  Surgeon: [name], MD;  Location: [place];  Service: Cardiovascular;  Laterality: N/A;  . Amputation Right 09/07/2013    Procedure: AMPUTATION RAY;  Surgeon: [name], MD;  Location: [place];  Service: Orthopedics;  Laterality: Right;  Right Transmetatarsal Amputation    Laurette Schimke [place], [place], [place]

## 2014-05-21 NOTE — Progress Notes (Addendum)
Progress Note   [name] WGY:[id number] DOB: 1940/06/09 DOA: 05/20/2014 PCP: Andrea Cranker, DO   Brief Narrative:   [name] is an 74 y.o. female with a PMH of diabetes with circulatory complications and retinopathy, recurrent UTI, stage IV chronic kidney disease with a baseline creatinine of 2-2.5, failure to thrive with recent hospitalization 03/01/14-03/12/14 for treatment of acute encephalopathy related to UTI and acute on chronic renal failure with recommendations for outpatient palliative care/hospice follow-up upon discharge, was admitted 05/20/14 with altered mental status and fever, treated as a code sepsis on admission.  Assessment/Plan:   Principal Problem:   Sepsis secondary to UTI versus healthcare associated pneumonia - Source of sepsis either from UTI or HCAP.  - U/A with turbid appearance and the large amount of leukocytes. - Chest x-ray with left mid lung opacity concerning for pneumonia. - Provided with fluid volume resuscitation on admission with stabilization of blood pressure. - Continue broad-spectrum antibiotics with cefepime and vancomycin pending blood/urine cultures. - Follow-up strep pneumonia/Legionella antigens.  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 - Start SSI, insulin sensitive scale every 4 hours while nothing by mouth.    MRSA carrier - Contact isolation. Decontamination therapy ordered.    Elevated troponin/demand ischemia - Patient's elevated troponin is likely from demand ischemia in the setting of sepsis and anemia.    Normocytic anemia - Hemoglobin 6.7 mg/dL this morning. One unit of blood ordered. - Heme check stools. - Baseline creatinine appears to be 9.2-9.8 mg/dL, likely from anemia of chronic kidney disease.    Blindness - Supportive care.    Chronic renal failure, stage 4 - Baseline creatinine 2-2.5, current creatinine at usual baseline  values.    Acute encephalopathy - Secondary to infection. Treat infection. - CT negative for acute findings except for possible sinusitis.    Chronic diastolic CHF (congestive heart failure) - Monitor for signs of fluid volume overload.    FTT (failure to thrive) in adult - Palliative care consultation requested.    DVT Prophylaxis - SCDs ordered  Code Status: Full. Family Communication: [name] (SW) legal guardian.  Updated [name] (daughter), [name] (sister) at bedside. Disposition Plan: From [facility].   IV Access:    Peripheral IV   Procedures and diagnostic studies:   Ct Head Wo Contrast 05/20/2014: 1. No evidence of acute intracranial abnormality. 2. Unchanged hydrocephalus. 3. Paranasal sinus mucosal thickening and fluid. Correlate clinically for acute sinusitis.     Dg Chest Port 1 View 05/20/2014: Focal opacification over the left mid upper lung which may be due to a pneumonia. Additional findings suggesting mild vascular congestion with stable cardiomegaly.    Medical Consultants:    Palliative Care  Anti-Infectives:    Cefepime 05/20/14--->  Vancomycin 05/20/14--->  Subjective:   [name] reports pain "in her stomach".  No N/V, dyspnea.  Occasional moist cough.  Objective:    Filed Vitals:   05/21/14 0500 05/21/14 0530 05/21/14 0600 05/21/14 0700  BP: 70/33 117/52 119/64 120/50  Pulse: 64 54 58 57  Temp:      TempSrc:      Resp: 14 13 13 13   Height:      Weight:      SpO2: 100% 100% 100% 100%    Intake/Output Summary (Last 24 hours) at 05/21/14 0720 Last data filed at 05/21/14 0600  Gross per 24 hour  Intake 336.25 ml  Output  30 ml  Net 306.25 ml    Exam: Gen:  NAD, lethargic. Cardiovascular:  RRR, No M/R/G Respiratory:  Lungs diminished Gastrointestinal:  Abdomen soft, mildly tender, + BS Extremities:  No C/E/C, right transtarsal amputee   Data Reviewed:    Labs: Basic Metabolic Panel:  Recent Labs Lab  05/20/14 2032 05/21/14 0330  NA 137 135  K 3.9 3.6  CL 98 104  CO2 24 18*  GLUCOSE 169* 211*  BUN 38* 37*  CREATININE 2.31* 2.11*  CALCIUM 7.3* 6.5*   GFR Estimated Creatinine Clearance: 24 mL/min (by C-G formula based on Cr of 2.11). Liver Function Tests:  Recent Labs Lab 05/20/14 2032  AST 111*  ALT 25  ALKPHOS 78  BILITOT 1.4*  PROT 7.2  ALBUMIN 1.8*   CBC:  Recent Labs Lab 05/20/14 2032 05/21/14 0330  WBC 16.4* 18.0*  NEUTROABS 14.1* 15.8*  HGB 7.5* 6.7*  HCT 24.5* 21.5*  MCV 89.1 88.8  PLT 243 225   Cardiac Enzymes:  Recent Labs Lab 05/20/14 2032  TROPONINI 1.05*   BNP (last 3 results)  Recent Labs  12/26/13 1640 12/27/13 0040  PROBNP 5360.0* 4645.0*   CBG:  Recent Labs Lab 05/20/14 2006  GLUCAP 168*   Sepsis Labs:  Recent Labs Lab 05/20/14 2012 05/20/14 2032 05/21/14 0330  WBC  --  16.4* 18.0*  LATICACIDVEN 1.73  --   --    Microbiology Recent Results (from the past 240 hour(s))  MRSA PCR Screening     Status: Abnormal   Collection Time: 05/20/14 11:37 PM  Result Value Ref Range Status   MRSA by PCR POSITIVE (A) NEGATIVE Final    Comment:        The GeneXpert MRSA Assay (FDA approved for NASAL specimens only), is one component of a comprehensive MRSA colonization surveillance program. It is not intended to diagnose MRSA infection nor to guide or monitor treatment for MRSA infections. RESULT CALLED TO, READ BACK BY AND VERIFIED WITH: Andrea Dun RN [phone] 05/21/14 A [place]O   Clostridium Difficile by PCR     Status: None   Collection Time: 05/21/14  1:59 AM  Result Value Ref Range Status   C difficile by pcr NEGATIVE NEGATIVE Final     Medications:   . sodium chloride   Intravenous Once  . antiseptic oral rinse  7 mL Mouth Rinse q12n4p  . ceFEPime (MAXIPIME) IV  1 g Intravenous Q24H  . chlorhexidine  15 mL Mouth Rinse BID  . vancomycin  1,000 mg Intravenous Q24H   Continuous Infusions: . sodium chloride 1,000 mL  (05/21/14 0131)    Time spent: 35 minutes with > 50% of time discussing current diagnostic test results, clinical impression and plan of care with her family at the bedside.   LOS: 1 day   [name],Andrea  [facility] Pager [phone]. If unable to reach me by pager, please call my cell phone at [phone].  *Please refer to [email or web address], password [email or web address] to get updated schedule on who will round on this patient, as hospitalists switch teams weekly. If 7PM-7AM, please contact night-coverage at [email or web address], password [email or web address] for any overnight needs.  05/21/2014, 7:20 AM

## 2014-05-21 NOTE — H&P (Signed)
CSW assisting with d/c planning. Pt is from [facility]. Clinicals sent to SNF for review.   Werner Lean LCSW [phone]

## 2014-05-21 NOTE — Progress Notes (Signed)
CRITICAL VALUE ALERT  Critical value received:  Hgb 6.7  Date of notification:  05-21-14  Time of notification:  04:15  Critical value read back:Yes.    Nurse who received alert:  Lenox Ahr  MD notified (1st page):  Raliegh Ip Black  Time of first page:  04:20  MD notified (2nd page): N/A  Time of second page: N/A  Responding MD:  Lurlean Leyden  Time MD responded:  04:30

## 2014-05-21 NOTE — Progress Notes (Signed)
CARE MANAGEMENT NOTE 05/21/2014  Patient:  AYMEE, FOMBY   Account Number:  [id number]  Date Initiated:  05/21/2014  Documentation initiated by:  DAVIS,RHONDA  Subjective/Objective Assessment:   sepsis severe     Action/Plan:   snf   Anticipated DC Date:  05/24/2014   Anticipated DC Plan:  SKILLED NURSING FACILITY  In-house referral  Clinical Social Worker      DC Planning Services  CM consult      [facility] Choice  NA   Choice offered to / List presented to:  NA           Status of service:  In process, will continue to follow Medicare Important Message given?   (If response is "NO", the following Medicare IM given date fields will be blank) Date Medicare IM given:   Medicare IM given by:   Date Additional Medicare IM given:   Additional Medicare IM given by:    Discharge Disposition:    Per UR Regulation:  Reviewed for med. necessity/level of care/duration of stay  If discussed at [place] of Stay Meetings, dates discussed:    Comments:  May 21, 2014/Rhonda L. Rosana Hoes, RN, BSN, CCM. Case Management [place] [phone] No discharge needs present of time of review.

## 2014-05-21 NOTE — Progress Notes (Signed)
Pt received a critical Hgb of 6.7.  [facility] was paged with respect to this value and subsequent orders for 1 unit of RBC's were initiated.  Based on the patient's status, I attempted to contact legal representatives for consent.  Due to the early hour, consent was not confirmed.  This information will be passed to the day shift nurse in a an effort to gain consent should a transfusion remain necessary.

## 2014-05-22 DIAGNOSIS — B370 Candidal stomatitis: Secondary | ICD-10-CM | POA: Diagnosis present

## 2014-05-22 DIAGNOSIS — F322 Major depressive disorder, single episode, severe without psychotic features: Secondary | ICD-10-CM | POA: Diagnosis present

## 2014-05-22 DIAGNOSIS — R197 Diarrhea, unspecified: Secondary | ICD-10-CM | POA: Diagnosis present

## 2014-05-22 DIAGNOSIS — E876 Hypokalemia: Secondary | ICD-10-CM

## 2014-05-22 DIAGNOSIS — R52 Pain, unspecified: Secondary | ICD-10-CM | POA: Diagnosis present

## 2014-05-22 DIAGNOSIS — Z515 Encounter for palliative care: Secondary | ICD-10-CM

## 2014-05-22 LAB — BASIC METABOLIC PANEL
ANION GAP: 11 (ref 5–15)
BUN: 43 mg/dL — AB (ref 6–23)
CHLORIDE: 108 mmol/L (ref 96–112)
CO2: 19 mmol/L (ref 19–32)
CREATININE: 2.06 mg/dL — AB (ref 0.50–1.10)
Calcium: 6.9 mg/dL — ABNORMAL LOW (ref 8.4–10.5)
GFR calc Af Amer: 27 mL/min — ABNORMAL LOW (ref >90)
GFR calc non Af Amer: 23 mL/min — ABNORMAL LOW (ref >90)
GLUCOSE: 138 mg/dL — AB (ref 70–99)
Potassium: 3 mmol/L — ABNORMAL LOW (ref 3.5–5.1)
Sodium: 138 mmol/L (ref 135–145)

## 2014-05-22 LAB — GLUCOSE, CAPILLARY
GLUCOSE-CAPILLARY: 102 mg/dL — AB (ref 70–99)
Glucose-Capillary: 103 mg/dL — ABNORMAL HIGH (ref 70–99)
Glucose-Capillary: 108 mg/dL — ABNORMAL HIGH (ref 70–99)
Glucose-Capillary: 113 mg/dL — ABNORMAL HIGH (ref 70–99)
Glucose-Capillary: 120 mg/dL — ABNORMAL HIGH (ref 70–99)
Glucose-Capillary: 158 mg/dL — ABNORMAL HIGH (ref 70–99)

## 2014-05-22 LAB — TROPONIN I: TROPONIN I: 0.36 ng/mL — AB (ref <0.031)

## 2014-05-22 LAB — CBC
HCT: 29.2 % — ABNORMAL LOW (ref 36.0–46.0)
Hemoglobin: 9.3 g/dL — ABNORMAL LOW (ref 12.0–15.0)
MCH: 27.8 pg (ref 26.0–34.0)
MCHC: 31.8 g/dL (ref 30.0–36.0)
MCV: 87.4 fL (ref 78.0–100.0)
PLATELETS: 226 10*3/uL (ref 150–400)
RBC: 3.34 MIL/uL — ABNORMAL LOW (ref 3.87–5.11)
RDW: 18.3 % — ABNORMAL HIGH (ref 11.5–15.5)
WBC: 24 10*3/uL — ABNORMAL HIGH (ref 4.0–10.5)

## 2014-05-22 LAB — TYPE AND SCREEN
ABO/RH(D): A POS
ANTIBODY SCREEN: NEGATIVE
UNIT DIVISION: 0

## 2014-05-22 MED ORDER — POTASSIUM CHLORIDE CRYS ER 20 MEQ PO TBCR
40.0000 meq | EXTENDED_RELEASE_TABLET | Freq: Once | ORAL | Status: DC
  Filled 2014-05-22: qty 2

## 2014-05-22 MED ORDER — SERTRALINE HCL 20 MG/ML PO CONC
25.0000 mg | Freq: Every day | ORAL | Status: DC
  Administered 2014-05-22 – 2014-05-25 (×4): 25 mg via ORAL
  Filled 2014-05-22 (×5): qty 1.25

## 2014-05-22 MED ORDER — ACETAMINOPHEN 325 MG PO TABS
650.0000 mg | ORAL_TABLET | Freq: Three times a day (TID) | ORAL | Status: DC
  Administered 2014-05-22 – 2014-05-25 (×8): 650 mg via ORAL
  Filled 2014-05-22 (×10): qty 2

## 2014-05-22 MED ORDER — INSULIN ASPART 100 UNIT/ML ~~LOC~~ SOLN: 0.0000 [IU] | Freq: Every day | SUBCUTANEOUS | Status: DC

## 2014-05-22 MED ORDER — GLUCERNA SHAKE PO LIQD
237.0000 mL | Freq: Three times a day (TID) | ORAL | Status: DC
Start: 2014-05-22 — End: 2014-05-25
  Administered 2014-05-24 – 2014-05-25 (×2): 237 mL via ORAL
  Filled 2014-05-22 (×9): qty 237

## 2014-05-22 MED ORDER — FLUCONAZOLE IN SODIUM CHLORIDE 200-0.9 MG/100ML-% IV SOLN
200.0000 mg | Freq: Once | INTRAVENOUS | Status: AC
  Administered 2014-05-22: 200 mg via INTRAVENOUS
  Filled 2014-05-22: qty 100

## 2014-05-22 MED ORDER — POTASSIUM CHLORIDE 10 MEQ/100ML IV SOLN
10.0000 meq | INTRAVENOUS | Status: AC
  Administered 2014-05-22 (×3): 10 meq via INTRAVENOUS
  Filled 2014-05-22 (×3): qty 100

## 2014-05-22 MED ORDER — FLUCONAZOLE 100MG IVPB
100.0000 mg | INTRAVENOUS | Status: DC
  Administered 2014-05-23: 100 mg via INTRAVENOUS
  Filled 2014-05-22 (×2): qty 50

## 2014-05-22 MED ORDER — POTASSIUM CHLORIDE IN NACL 40-0.9 MEQ/L-% IV SOLN
INTRAVENOUS | Status: DC
  Administered 2014-05-22: 100 mL/h via INTRAVENOUS
  Administered 2014-05-22: 75 mL/h via INTRAVENOUS
  Administered 2014-05-23 – 2014-05-24 (×3): 100 mL/h via INTRAVENOUS
  Filled 2014-05-22 (×10): qty 1000

## 2014-05-22 MED ORDER — MAGIC MOUTHWASH
10.0000 mL | Freq: Three times a day (TID) | ORAL | Status: DC
  Administered 2014-05-22 – 2014-05-25 (×8): 10 mL via ORAL
  Filled 2014-05-22 (×10): qty 10

## 2014-05-22 MED ORDER — SODIUM CHLORIDE 0.9 % IV BOLUS (SEPSIS)
500.0000 mL | Freq: Once | INTRAVENOUS | Status: AC
  Administered 2014-05-22: 500 mL via INTRAVENOUS

## 2014-05-22 MED ORDER — FENTANYL CITRATE (PF) 100 MCG/2ML IJ SOLN: 12.5000 ug | INTRAMUSCULAR | Status: DC | PRN

## 2014-05-22 MED ORDER — INSULIN ASPART 100 UNIT/ML ~~LOC~~ SOLN: 0.0000 [IU] | Freq: Three times a day (TID) | SUBCUTANEOUS | Status: DC

## 2014-05-22 NOTE — Progress Notes (Addendum)
ANTIBIOTIC CONSULT NOTE - INITIAL  Pharmacy Consult for fluconazole Indication: oral candidiasis  No Known Allergies  Patient Measurements: Height: 5\' 3"  (160 cm) Weight: 180 lb 1.9 oz (81.7 kg) IBW/kg (Calculated) : 52.4 Adjusted Body Weight:   Vital Signs: Temp: 98.3 F (36.8 C) (04/23 1200) Temp Source: Oral (04/23 1200) BP: 127/52 mmHg (04/23 1600) Pulse Rate: 73 (04/23 1554) Intake/Output from previous day: 04/22 0701 - 04/23 0700 In: 2502 [P.O.:267; I.V.:1650; Blood:335; IV Piggyback:250] Out: 75 [Urine:75] Intake/Output from this shift: Total I/O In: 1606.7 [P.O.:150; I.V.:756.7; Other:500; IV Piggyback:200] Out: 116 [Urine:116]  Labs:  Recent Labs  05/20/14 2032 05/21/14 0330 05/22/14 0340  WBC 16.4* 18.0* 24.0*  HGB 7.5* 6.7* 9.3*  PLT 243 225 226  CREATININE 2.31* 2.11* 2.06*   Estimated Creatinine Clearance: 25 mL/min (by C-G formula based on Cr of 2.06). No results for input(s): VANCOTROUGH, VANCOPEAK, VANCORANDOM, GENTTROUGH, GENTPEAK, GENTRANDOM, TOBRATROUGH, TOBRAPEAK, TOBRARND, AMIKACINPEAK, AMIKACINTROU, AMIKACIN in the last 72 hours.   Microbiology: Recent Results (from the past 720 hour(s))  Urine culture     Status: None (Preliminary result)   Collection Time: 05/20/14  8:08 PM  Result Value Ref Range Status   Specimen Description URINE, CATHETERIZED  Final   Special Requests NONE  Final   Culture   Final    Culture reincubated for better growth Performed at [facility]    Report Status PENDING  Incomplete  Blood Culture (routine x 2)     Status: None (Preliminary result)   Collection Time: 05/20/14  8:32 PM  Result Value Ref Range Status   Specimen Description BLOOD RIGHT HAND  Final   Special Requests BOTTLES DRAWN AEROBIC AND ANAEROBIC 5CC  Final   Culture   Final           BLOOD CULTURE RECEIVED NO GROWTH TO DATE CULTURE WILL BE HELD FOR 5 DAYS BEFORE ISSUING A FINAL NEGATIVE REPORT Performed at [facility]     Report Status PENDING  Incomplete  Blood Culture (routine x 2)     Status: None (Preliminary result)   Collection Time: 05/20/14  8:41 PM  Result Value Ref Range Status   Specimen Description BLOOD LEFT HAND  Final   Special Requests BOTTLES DRAWN AEROBIC ONLY 3CC  Final   Culture   Final           BLOOD CULTURE RECEIVED NO GROWTH TO DATE CULTURE WILL BE HELD FOR 5 DAYS BEFORE ISSUING A FINAL NEGATIVE REPORT Performed at [facility]    Report Status PENDING  Incomplete  MRSA PCR Screening     Status: Abnormal   Collection Time: 05/20/14 11:37 PM  Result Value Ref Range Status   MRSA by PCR POSITIVE (A) NEGATIVE Final    Comment:        The GeneXpert MRSA Assay (FDA approved for NASAL specimens only), is one component of a comprehensive MRSA colonization surveillance program. It is not intended to diagnose MRSA infection nor to guide or monitor treatment for MRSA infections. RESULT CALLED TO, READ BACK BY AND VERIFIED WITH: Harmon Dun RN [phone] 05/21/14 A NAVARRO   Clostridium Difficile by PCR     Status: None   Collection Time: 05/21/14  1:59 AM  Result Value Ref Range Status   C difficile by pcr NEGATIVE NEGATIVE Final    Medical History: Past Medical History  Diagnosis Date  . Hypertension   . Diabetes mellitus   . Blindness   . TIA (transient  ischemic attack)   . Anemia   . GERD (gastroesophageal reflux disease)   . Constipation   . Renal disorder 05/2012    acute Renal Failure  . SYNCOPE 05/19/2009    Annotation: multiple over past 1 yr  Qualifier: Diagnosis of  By: [name] MD, [name]    . CHOLECYSTECTOMY, HX OF 05/19/2009    Qualifier: Diagnosis of  By: [name] MD, [name]    . 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 Chronic osteomyelitis of toe of right foot 09/19/2013    3rd, 4th, 5th    Assessment: 75 YOF admitted with sepsis currently on broad spectrum antibiotics.  Palliative care has consulted pharmacy to dose fluconazole for oral candidiasis   Goal of Therapy:   Dose for indication and patient-specific parameters  Plan:   For CrCl < 59ml/min, start Fluconazole 200mg  IV x 1 then 100mg  IV q24h  Doreene Eland, PharmD, BCPS.   Pager: [phone]  05/22/2014,6:28 PM

## 2014-05-22 NOTE — Consult Note (Signed)
Palliative Medicine Team Consult Note  74 yo woman from SNF, vascular dementia following stroke, hypersomnolence, depression and multiple chronic medical problems- DM, HTN, and recurrent PNA and UTI, immobility related to prior stroke, renal failure stage IV not a candidate for HD-prior hospice and or Palliative order was placed [facility] but was not completed- patient has a DSS guardian- details of this are unclear- there is a daughter and sister at bedside who have been very involved in her care.  Will need to contact DSS and facilitate a full goals with DSS and family involved- I also discovered that Baby may actually have more capacity than documentation supports- she knew she was in the hospital-she told me that she was from [facility] and how long she had been there- she was also able to tell me in pretty good detail about her pain. 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 She tells me that she just doesn't want to eat- she is extremely fatigued.  Recommendations:  1. Full Code until DSS cn be involved-daughter clearly had prior conversations that may not have been effective- ie. Daughter focused on DNR as something withheld or inherently bad -withholding something that could help her mom- reasoning is of course- why would we even offer something that wouldn't help her- I attempted to explain teh DNR actually as a way to protect her mother from a default that could cause harm and do the exact opposite of help- I re-iterated DNR does not mean do not Treat- Kasiyah and her daughter appreciated that clarification and were going to think about it.  2. SM: Pain is a problem in general for [place]. I will start her on scheduled tylenol and PRN IV fentanyl.  3. She complained of severe pain at IV site with KCL -and also about blood draws. She  would benefit from a PICC line for comfort. She will never be an HD candidate so arm should be ok.  4. She also has thrush behind her dentures and had pain when removing them- this may also be causing her to not eat- she has a history of achalasia as well- high risk for aspiration but she should never be offered artifical feeding tube in her bedbound condition. I discussed the concept of best textures and not limiting her - I changed diet to a D3 and ok for thin liquids- need aspiration precuations with all PO intake.She should also be offered fluids every 2 hours while awake for comfort.  5. Depression- will start her on Zoloft which is much more activating than other SSRIs- she was on Cymbalta at SNF, but it may be causing some of her somnolence. I may consider a trial of a stimulant-but would need to weight risk benefit- could use Provigil with less CV side effects.  Will follow and coordinate with DSS regarding advance care planning and code status on Monday- if her condition deteriorates medical escalation should immediately be discussed with an on call guardian and medical recommendation for DNR should be made.  Lane Hacker, DO Palliative Medicine [phone]  Time: 5:20PM-6:30PM Time: 70 minutes Greater than 50%  of this time was spent counseling and coordinating care related to the above assessment and plan.

## 2014-05-22 NOTE — Progress Notes (Signed)
Progress Note   [name] KKX:[id number] DOB: 18-Jul-1940 DOA: 05/20/2014 PCP: Gildardo Cranker, DO   Brief Narrative:   [name] is an 74 y.o. female with a PMH of diabetes with circulatory complications and retinopathy, recurrent UTI, stage IV chronic kidney disease with a baseline creatinine of 2-2.5, failure to thrive with recent hospitalization 03/01/14-03/12/14 for treatment of acute encephalopathy related to UTI and acute on chronic renal failure with recommendations for outpatient palliative care/hospice follow-up upon discharge, was admitted 05/20/14 with altered mental status and fever, treated as a code sepsis on admission.  Assessment/Plan:   Principal Problem:   Sepsis secondary to UTI versus healthcare associated pneumonia - Source of sepsis either from UTI or HCAP.  - U/A with turbid appearance and the large amount of leukocytes. - Chest x-ray with left mid lung opacity concerning for pneumonia. - Provided with fluid volume resuscitation on admission with stabilization of blood pressure. - Continue broad-spectrum antibiotics with cefepime and vancomycin pending blood/urine cultures. - Strep pneumonia antigen negative/Legionella antigen still pending. - WBC still very elevated. Lactic acid is not elevated.  Active Problems:   Diarrhea - C. Diff negative.    Hypokalemia - Replete and add potassium to IV fluids.      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 - CBGs 113-210. Continue insulin sensitive SSI.    MRSA carrier - Continue contact isolation. Decontamination therapy ordered.    Elevated troponin/demand ischemia - Patient's elevated troponin is likely from demand ischemia in the setting of sepsis and anemia. - Repeat troponin.    Normocytic anemia - Hemoglobin 9.3 status post 1 unit of PRBCs 05/21/14.  - Heme check stools, pending. - Baseline hemoglobin appears to be 9.2-9.8 mg/dL, likely from  anemia of chronic kidney disease.    Blindness - Supportive care.    Chronic renal failure, stage 4 - Baseline creatinine 2-2.5, current creatinine at usual baseline values.    Acute encephalopathy - Secondary to infection. Treat infection. - CT negative for acute findings except for possible sinusitis.    Chronic diastolic CHF (congestive heart failure) - Monitor for signs of fluid volume overload.    FTT (failure to thrive) in adult - Palliative care consultation requested.    DVT Prophylaxis - SCDs ordered  Code Status: Full. Family Communication: [name] (SW) legal guardian.  Updated [name] (daughter), [name] (sister) at bedside 05/21/14. Disposition Plan: From [facility].  Will likely return in 2-3 days, when cultures finalized and course of antibiotics defined.   IV Access:    Peripheral IV   Procedures and diagnostic studies:   Ct Head Wo Contrast 05/20/2014: 1. No evidence of acute intracranial abnormality. 2. Unchanged hydrocephalus. 3. Paranasal sinus mucosal thickening and fluid. Correlate clinically for acute sinusitis.     Dg Chest Port 1 View 05/20/2014: Focal opacification over the left mid upper lung which may be due to a pneumonia. Additional findings suggesting mild vascular congestion with stable cardiomegaly.    Medical Consultants:    Palliative Care  Anti-Infectives:    Cefepime 05/20/14--->  Vancomycin 05/20/14--->  Subjective:   [name] reports ongoing pain "in her stomach".  No N/V, dyspnea.  Having some loose stools.  Appetite fair.  Objective:    Filed Vitals:   05/22/14 0400 05/22/14 0500 05/22/14 0505 05/22/14 0600  BP: 111/51 78/44 116/59 125/54  Pulse: 55 61 63   Temp: 97.7 F (36.5 C)     TempSrc: Axillary  Resp: 11 13 13 11   Height:      Weight: 81.7 kg (180 lb 1.9 oz)     SpO2: 100% 100% 100%     Intake/Output Summary (Last 24 hours) at 05/22/14 2620 Last data filed at 05/22/14 0500  Gross per 24 hour    Intake   2352 ml  Output     75 ml  Net   2277 ml    Exam: Gen:  NAD, lethargic. Cardiovascular:  RRR, No M/R/G Respiratory:  Lungs diminished Gastrointestinal:  Abdomen soft, mildly tender, + BS Extremities:  No C/E/C, right transtarsal amputee   Data Reviewed:    Labs: Basic Metabolic Panel:  Recent Labs Lab 05/20/14 2032 05/21/14 0330 05/22/14 0340  NA 137 135 138  K 3.9 3.6 3.0*  CL 98 104 108  CO2 24 18* 19  GLUCOSE 169* 211* 138*  BUN 38* 37* 43*  CREATININE 2.31* 2.11* 2.06*  CALCIUM 7.3* 6.5* 6.9*   GFR Estimated Creatinine Clearance: 25 mL/min (by C-G formula based on Cr of 2.06). Liver Function Tests:  Recent Labs Lab 05/20/14 2032  AST 111*  ALT 25  ALKPHOS 78  BILITOT 1.4*  PROT 7.2  ALBUMIN 1.8*   CBC:  Recent Labs Lab 05/20/14 2032 05/21/14 0330 05/22/14 0340  WBC 16.4* 18.0* 24.0*  NEUTROABS 14.1* 15.8*  --   HGB 7.5* 6.7* 9.3*  HCT 24.5* 21.5* 29.2*  MCV 89.1 88.8 87.4  PLT 243 225 226   Cardiac Enzymes:  Recent Labs Lab 05/20/14 2032  TROPONINI 1.05*   BNP (last 3 results)  Recent Labs  12/26/13 1640 12/27/13 0040  PROBNP 5360.0* 4645.0*   CBG:  Recent Labs Lab 05/21/14 1231 05/21/14 1604 05/21/14 2031 05/21/14 2351 05/22/14 0441  GLUCAP 210* 154* 136* 158* 113*   Sepsis Labs:  Recent Labs Lab 05/20/14 2012 05/20/14 2032 05/21/14 0330 05/22/14 0340  WBC  --  16.4* 18.0* 24.0*  LATICACIDVEN 1.73  --   --   --    Microbiology Recent Results (from the past 240 hour(s))  Urine culture     Status: None (Preliminary result)   Collection Time: 05/20/14  8:08 PM  Result Value Ref Range Status   Specimen Description URINE, CATHETERIZED  Final   Special Requests NONE  Final   Culture   Final    Culture reincubated for better growth Performed at [facility]    Report Status PENDING  Incomplete  MRSA PCR Screening     Status: Abnormal   Collection Time: 05/20/14 11:37 PM  Result Value Ref  Range Status   MRSA by PCR POSITIVE (A) NEGATIVE Final    Comment:        The GeneXpert MRSA Assay (FDA approved for NASAL specimens only), is one component of a comprehensive MRSA colonization surveillance program. It is not intended to diagnose MRSA infection nor to guide or monitor treatment for MRSA infections. RESULT CALLED TO, READ BACK BY AND VERIFIED WITH: Harmon Dun RN [phone] 05/21/14 A NAVARRO   Clostridium Difficile by PCR     Status: None   Collection Time: 05/21/14  1:59 AM  Result Value Ref Range Status   C difficile by pcr NEGATIVE NEGATIVE Final     Medications:   . sodium chloride   Intravenous Once  . antiseptic oral rinse  7 mL Mouth Rinse q12n4p  . ceFEPime (MAXIPIME) IV  1 g Intravenous Q24H  . chlorhexidine  15 mL Mouth Rinse BID  . Chlorhexidine  Gluconate Cloth  6 each Topical [id number]  . feeding supplement (ENSURE ENLIVE)  237 mL Oral BID BM  . insulin aspart  0-9 Units Subcutaneous 6 times per day  . mupirocin ointment   Nasal BID  . vancomycin  1,000 mg Intravenous Q24H   Continuous Infusions: . sodium chloride 1,000 mL (05/22/14 0305)    Time spent: 35 minutes.  The patient is medically complex and requires high complexity decision making. .   LOS: 2 days   [facility] Pager [phone]. If unable to reach me by pager, please call my cell phone at [phone].  *Please refer to [email or web address], password [email or web address] to get updated schedule on who will round on this patient, as hospitalists switch teams weekly. If 7PM-7AM, please contact night-coverage at [email or web address], password [email or web address] for any overnight needs.  05/22/2014, 7:12 AM

## 2014-05-23 DIAGNOSIS — F32A Depression, unspecified: Secondary | ICD-10-CM | POA: Diagnosis present

## 2014-05-23 DIAGNOSIS — G894 Chronic pain syndrome: Secondary | ICD-10-CM | POA: Diagnosis present

## 2014-05-23 DIAGNOSIS — F329 Major depressive disorder, single episode, unspecified: Secondary | ICD-10-CM | POA: Diagnosis present

## 2014-05-23 DIAGNOSIS — B370 Candidal stomatitis: Secondary | ICD-10-CM | POA: Diagnosis present

## 2014-05-23 HISTORY — DX: Candidal stomatitis: B37.0

## 2014-05-23 LAB — BASIC METABOLIC PANEL
ANION GAP: 7 (ref 5–15)
BUN: 41 mg/dL — ABNORMAL HIGH (ref 6–23)
CO2: 18 mmol/L — AB (ref 19–32)
Calcium: 7.2 mg/dL — ABNORMAL LOW (ref 8.4–10.5)
Chloride: 112 mmol/L (ref 96–112)
Creatinine, Ser: 1.87 mg/dL — ABNORMAL HIGH (ref 0.50–1.10)
GFR calc Af Amer: 30 mL/min — ABNORMAL LOW (ref >90)
GFR calc non Af Amer: 26 mL/min — ABNORMAL LOW (ref >90)
GLUCOSE: 112 mg/dL — AB (ref 70–99)
Potassium: 4.9 mmol/L (ref 3.5–5.1)
SODIUM: 137 mmol/L (ref 135–145)

## 2014-05-23 LAB — GLUCOSE, CAPILLARY
Glucose-Capillary: 116 mg/dL — ABNORMAL HIGH (ref 70–99)
Glucose-Capillary: 123 mg/dL — ABNORMAL HIGH (ref 70–99)
Glucose-Capillary: 128 mg/dL — ABNORMAL HIGH (ref 70–99)
Glucose-Capillary: 132 mg/dL — ABNORMAL HIGH (ref 70–99)
Glucose-Capillary: 84 mg/dL (ref 70–99)
Glucose-Capillary: 93 mg/dL (ref 70–99)

## 2014-05-23 LAB — CBC
HCT: 32.1 % — ABNORMAL LOW (ref 36.0–46.0)
HEMOGLOBIN: 9.9 g/dL — AB (ref 12.0–15.0)
MCH: 27.9 pg (ref 26.0–34.0)
MCHC: 30.8 g/dL (ref 30.0–36.0)
MCV: 90.4 fL (ref 78.0–100.0)
PLATELETS: 172 10*3/uL (ref 150–400)
RBC: 3.55 MIL/uL — ABNORMAL LOW (ref 3.87–5.11)
RDW: 18.3 % — ABNORMAL HIGH (ref 11.5–15.5)
WBC: 16.8 10*3/uL — AB (ref 4.0–10.5)

## 2014-05-23 MED ORDER — CHLORHEXIDINE GLUCONATE CLOTH 2 % EX PADS
6.0000 | MEDICATED_PAD | Freq: Every morning | CUTANEOUS | Status: DC
  Administered 2014-05-23 – 2014-05-25 (×3): 6 via TOPICAL

## 2014-05-23 NOTE — Progress Notes (Signed)
Assessed for PICC placement per order.  Right arm basilic PICC occupancy of 91% with tourniquet still on.  Left basilic deep and PICC would occupy 62% of the vein with the tourniquet on.  Unable to successfully locate the cephalic bilaterally. Bil brachial occupancy measurement  >100%.  Dr. Rockne Menghini in  room, notified of measurements and need for PICC to be <50 % for bedside insertion. If PICC still desired, please refer to IR as before per FYI in chart.

## 2014-05-23 NOTE — Progress Notes (Signed)
ANTIBIOTIC CONSULT NOTE - FOLLOW UP  Pharmacy Consult for Cefepime, Vancomycin, fluconazole Indication: rule out pneumonia and rule out sepsis  No Known Allergies  Patient Measurements: Height: 5\' 3"  (160 cm) Weight: 180 lb 1.9 oz (81.7 kg) IBW/kg (Calculated) : 52.4  Vital Signs: Temp: 97.7 F (36.5 C) (04/24 0800) Temp Source: Oral (04/24 0800) BP: 132/52 mmHg (04/24 1000) Pulse Rate: 70 (04/24 0600) Intake/Output from previous day: 04/23 0701 - 04/24 0700 In: 3366.7 [P.O.:360; I.V.:2056.7; IV Piggyback:450] Out: 246 [Urine:246]  Labs:  Recent Labs  05/21/14 0330 05/22/14 0340 05/23/14 0725  WBC 18.0* 24.0* 16.8*  HGB 6.7* 9.3* 9.9*  PLT 225 226 172  CREATININE 2.11* 2.06* 1.87*   Estimated Creatinine Clearance: 27.5 mL/min (by C-G formula based on Cr of 1.87). No results for input(s): VANCOTROUGH, VANCOPEAK, VANCORANDOM, GENTTROUGH, GENTPEAK, GENTRANDOM, TOBRATROUGH, TOBRAPEAK, TOBRARND, AMIKACINPEAK, AMIKACINTROU, AMIKACIN in the last 72 hours.    Assessment: 74 yo female presents 4/21 from [facility] with fever, lethargy and AMS; CXR at [facility] showed pneumonia. CXR w/ LLL opacity.  PMH includes CHF, CKD, FTT, DM.  Pharmacy is consulted to dose Vancomycin and Cefepime for pneumonia, fluconazole for thrush.  4/21 >> Vanc >> 4/21 >> Cefepime >> 4/23 >> fluconazole >>  4/21 MRSA PCR: positive 4/21 blood x 2: ngtd 4/21 urine: > 100k Proteus (sens pending) 4/21 Strep pneumo Ur Ag: neg 4/21 Legionella Ur Ag: IP 4/22 Cdiff: negative  Today, 05/23/2014: Day #4 Vancomycin and Cefepime, Day #1 Fluconazole  Tmax: remains afebrile  WBC: elevated but improved, 16.8   Renal: SCr decreased to 1.87 , CrCl 27 ml/min  Lactate: 1.73 (4/21)  Vancomycin trough level: pending   Goal of Therapy:  Vancomycin trough level 15-20 mcg/ml Appropriate abx dosing, eradication of infection.   Plan:   Continue Fluconazole 100mg  IV q24h for thrush.  Continue  Cefepime 1g IV q24h  Continue Vancomycin 1g IV q24h.  Measure Vanc trough at steady state, prior to 2200 dose tonight.  Follow up renal fxn, culture results, and clinical course.  Gretta Arab PharmD, BCPS Pager [phone] 05/23/2014 11:43 AM

## 2014-05-23 NOTE — Progress Notes (Signed)
Progress Note   [name] RKY:[id number] DOB: 04-25-1940 DOA: 05/20/2014 PCP: Gildardo Cranker, DO   Brief Narrative:   [name] is an 74 y.o. female with a PMH of diabetes with circulatory complications and retinopathy, recurrent UTI, stage IV chronic kidney disease with a baseline creatinine of 2-2.5, failure to thrive with recent hospitalization 03/01/14-03/12/14 for treatment of acute encephalopathy related to UTI and acute on chronic renal failure with recommendations for outpatient palliative care/hospice follow-up upon discharge, was admitted 05/20/14 with altered mental status and fever, treated as a code sepsis on admission.  Assessment/Plan:   Principal Problem:   Sepsis secondary to UTI versus healthcare associated pneumonia - Source of sepsis either from UTI or HCAP.  - U/A with turbid appearance and the large amount of leukocytes. - Chest x-ray with left mid lung opacity concerning for pneumonia. - Provided with fluid volume resuscitation on admission with stabilization of blood pressure. - Continue broad-spectrum antibiotics with cefepime and vancomycin pending blood/urine cultures which are negative to date. - Strep pneumonia antigen negative/Legionella antigen still pending.  Active Problems:   Depression - Zoloft started. Cymbalta felt to be too sedating.    Oral thrush - Dysphagia 3 diet/thin liquids with aspiration precautions. - Diflucan started.    Chronic pain - Continue IV fentanyl and scheduled Tylenol per palliative care team recommendations.    Diarrhea - C. Diff negative.    Hypokalemia - Supplementing.      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 - CBGs 103-132. Continue insulin sensitive SSI.    MRSA carrier - Continue contact isolation. Decontamination therapy ordered.    Elevated troponin/demand ischemia - Patient's elevated troponin is likely from demand ischemia in the  setting of sepsis and anemia. - Repeat troponin was 1.05 --->0.36.    Normocytic anemia - Hemoglobin 9.3 status post 1 unit of PRBCs 05/21/14.  - Heme check stools, pending. 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    Blindness - Supportive care.    Chronic renal failure, stage 4 - Baseline creatinine 2-2.5, current creatinine at usual baseline values.    Acute encephalopathy - Secondary to infection. Treat infection. - CT negative for acute findings except for possible sinusitis.    Chronic diastolic CHF (congestive heart failure) - Monitor for signs of fluid volume overload.    FTT (failure to thrive) in adult - Seen by the palliative care team 05/22/14, with ongoing discussions with family regarding CODE STATUS and goals of care.    DVT Prophylaxis - SCDs ordered  Code Status: Full. Family Communication: [name] (SW) legal guardian.  Updated [name] (daughter), [name] (sister) at bedside 05/21/14.  No family at bedside today. Disposition Plan: From [facility].  Will likely return in 2-3 days, when cultures finalized and course of antibiotics defined.   IV Access:    Peripheral IV  PICC line requested by IR, IV team unable to place due to small veins.   Procedures and diagnostic studies:   Ct Head Wo Contrast 05/20/2014: 1. No evidence of acute intracranial abnormality. 2. Unchanged hydrocephalus. 3. Paranasal sinus mucosal thickening and fluid. Correlate clinically for acute sinusitis.     Dg Chest Port 1 View 05/20/2014: Focal opacification over the left mid upper lung which may be due to a pneumonia. Additional findings suggesting mild vascular congestion with stable cardiomegaly.    Medical Consultants:    Dr. Lane Hacker,  Palliative Care  Anti-Infectives:    Cefepime 05/20/14--->  Vancomycin 05/20/14--->  Diflucan 05/23/14--->  Subjective:   [name] denies dyspnea, still has a cough.   Denies nausea and constipation.  No complaints of pain.  Objective:    Filed Vitals:   05/23/14 0300 05/23/14 0400 05/23/14 0500 05/23/14 0600  BP: 128/57 120/52 115/61 128/62  Pulse: 72 72  70  Temp:      TempSrc:      Resp: 12 12 14 13   Height:      Weight:      SpO2: 100% 100%  100%    Intake/Output Summary (Last 24 hours) at 05/23/14 0732 Last data filed at 05/23/14 0600  Gross per 24 hour  Intake 3366.67 ml  Output    246 ml  Net 3120.67 ml    Exam: Gen:  NAD Cardiovascular:  RRR, No M/R/G Respiratory:  Lungs diminished Gastrointestinal:  Abdomen soft, NT, + BS Extremities:  No C/E/C, right transtarsal amputee   Data Reviewed:    Labs: Basic Metabolic Panel:  Recent Labs Lab 05/20/14 2032 05/21/14 0330 05/22/14 0340  NA 137 135 138  K 3.9 3.6 3.0*  CL 98 104 108  CO2 24 18* 19  GLUCOSE 169* 211* 138*  BUN 38* 37* 43*  CREATININE 2.31* 2.11* 2.06*  CALCIUM 7.3* 6.5* 6.9*   GFR Estimated Creatinine Clearance: 25 mL/min (by C-G formula based on Cr of 2.06). Liver Function Tests:  Recent Labs Lab 05/20/14 2032  AST 111*  ALT 25  ALKPHOS 78  BILITOT 1.4*  PROT 7.2  ALBUMIN 1.8*   CBC:  Recent Labs Lab 05/20/14 2032 05/21/14 0330 05/22/14 0340  WBC 16.4* 18.0* 24.0*  NEUTROABS 14.1* 15.8*  --   HGB 7.5* 6.7* 9.3*  HCT 24.5* 21.5* 29.2*  MCV 89.1 88.8 87.4  PLT 243 225 226   Cardiac Enzymes:  Recent Labs Lab 05/20/14 2032 05/22/14 0740  TROPONINI 1.05* 0.36*   BNP (last 3 results)  Recent Labs  12/26/13 1640 12/27/13 0040  PROBNP 5360.0* 4645.0*   CBG:  Recent Labs Lab 05/22/14 1158 05/22/14 1709 05/22/14 2050 05/22/14 2342 05/23/14 0436  GLUCAP 108* 103* 120* 123* 132*   Sepsis Labs:  Recent Labs Lab 05/20/14 2012 05/20/14 2032 05/21/14 0330 05/22/14 0340  WBC  --  16.4* 18.0* 24.0*  LATICACIDVEN 1.73  --   --   --    Microbiology Recent Results (from the past 240 hour(s))  Urine culture      Status: None (Preliminary result)   Collection Time: 05/20/14  8:08 PM  Result Value Ref Range Status   Specimen Description URINE, CATHETERIZED  Final   Special Requests NONE  Final   Colony Count   Final    >=100,000 COLONIES/ML Performed at [facility]    Culture   Final    PROTEUS MIRABILIS Performed at [facility]    Report Status PENDING  Incomplete  Blood Culture (routine x 2)     Status: None (Preliminary result)   Collection Time: 05/20/14  8:32 PM  Result Value Ref Range Status   Specimen Description BLOOD RIGHT HAND  Final   Special Requests BOTTLES DRAWN AEROBIC AND ANAEROBIC 5CC  Final   Culture   Final           BLOOD CULTURE RECEIVED NO GROWTH TO DATE CULTURE WILL BE HELD FOR 5 DAYS BEFORE ISSUING A FINAL NEGATIVE REPORT Performed at [facility]    Report  Status PENDING  Incomplete  Blood Culture (routine x 2)     Status: None (Preliminary result)   Collection Time: 05/20/14  8:41 PM  Result Value Ref Range Status   Specimen Description BLOOD LEFT HAND  Final   Special Requests BOTTLES DRAWN AEROBIC ONLY 3CC  Final   Culture   Final           BLOOD CULTURE RECEIVED NO GROWTH TO DATE CULTURE WILL BE HELD FOR 5 DAYS BEFORE ISSUING A FINAL NEGATIVE REPORT Performed at [facility]    Report Status PENDING  Incomplete  MRSA PCR Screening     Status: Abnormal   Collection Time: 05/20/14 11:37 PM  Result Value Ref Range Status   MRSA by PCR POSITIVE (A) NEGATIVE Final    Comment:        The GeneXpert MRSA Assay (FDA approved for NASAL specimens only), is one component of a comprehensive MRSA colonization surveillance program. It is not intended to diagnose MRSA infection nor to guide or monitor treatment for MRSA infections. RESULT CALLED TO, READ BACK BY AND VERIFIED WITH: Harmon Dun RN [phone] 05/21/14 A NAVARRO   Clostridium Difficile by PCR     Status: None   Collection Time: 05/21/14  1:59 AM  Result Value Ref Range  Status   C difficile by pcr NEGATIVE NEGATIVE Final     Medications:   . sodium chloride   Intravenous Once  . acetaminophen  650 mg Oral TID  . antiseptic oral rinse  7 mL Mouth Rinse q12n4p  . ceFEPime (MAXIPIME) IV  1 g Intravenous Q24H  . chlorhexidine  15 mL Mouth Rinse BID  . Chlorhexidine Gluconate Cloth  6 each Topical q morning - 10a  . feeding supplement (ENSURE ENLIVE)  237 mL Oral BID BM  . feeding supplement (GLUCERNA SHAKE)  237 mL Oral TID BM  . fluconazole (DIFLUCAN) IV  100 mg Intravenous Q24H  . insulin aspart  0-5 Units Subcutaneous QHS  . insulin aspart  0-9 Units Subcutaneous TID WC  . magic mouthwash  10 mL Oral TID  . mupirocin ointment   Nasal BID  . sertraline  25 mg Oral Daily  . vancomycin  1,000 mg Intravenous Q24H   Continuous Infusions: . 0.9 % NaCl with KCl 40 mEq / L 100 mL/hr (05/22/14 2242)    Time spent: 35 minutes.  The patient is medically complex and requires high complexity decision making. .   LOS: 3 days   [place] [facility] Pager [phone]. If unable to reach me by pager, please call my cell phone at [phone].  *Please refer to [email or web address], password [email or web address] to get updated schedule on who will round on this patient, as hospitalists switch teams weekly. If 7PM-7AM, please contact night-coverage at [email or web address], password [email or web address] for any overnight needs.  05/23/2014, 7:32 AM

## 2014-05-24 DIAGNOSIS — G894 Chronic pain syndrome: Secondary | ICD-10-CM

## 2014-05-24 LAB — BLOOD GAS, ARTERIAL

## 2014-05-24 LAB — GLUCOSE, CAPILLARY
Glucose-Capillary: 111 mg/dL — ABNORMAL HIGH (ref 70–99)
Glucose-Capillary: 96 mg/dL (ref 70–99)
Glucose-Capillary: 94 mg/dL (ref 70–99)
Glucose-Capillary: 83 mg/dL (ref 70–99)

## 2014-05-24 LAB — VANCOMYCIN, TROUGH: VANCOMYCIN TR: 26.7 ug/mL — AB (ref 10.0–20.0)

## 2014-05-24 LAB — URINE CULTURE: Colony Count: >=100000

## 2014-05-24 LAB — LEGIONELLA ANTIGEN, URINE

## 2014-05-24 NOTE — Progress Notes (Signed)
CSW spoke with [name] from [facility] who reports that Pt can return there at discharge.   CSW also attempted to call Pt legal guardian to obtain further information. CSW will attempt to contact other family as well in AM.  Peri Maris, [place] 05/24/2014 4:28 PM [phone]

## 2014-05-24 NOTE — Progress Notes (Signed)
ANTIBIOTIC CONSULT NOTE - FOLLOW UP  Pharmacy Consult for Cefepime, Vancomycin, fluconazole Indication: rule out pneumonia and rule out sepsis  No Known Allergies  Patient Measurements: Height: 5\' 3"  (160 cm) Weight: 183 lb 6.8 oz (83.2 kg) IBW/kg (Calculated) : 52.4  Vital Signs: Temp: 97.9 F (36.6 C) (04/25 0608) Temp Source: Oral (04/25 8502) BP: 112/48 mmHg (04/25 7741) Pulse Rate: 69 (04/25 0608) Intake/Output from previous day: 04/24 0701 - 04/25 0700 In: 2696.7 [P.O.:110; I.V.:2486.7; IV Piggyback:100] Out: -   Labs:  Recent Labs  05/22/14 0340 05/23/14 0725  WBC 24.0* 16.8*  HGB 9.3* 9.9*  PLT 226 172  CREATININE 2.06* 1.87*   Estimated Creatinine Clearance: 27.8 mL/min (by C-G formula based on Cr of 1.87).  Recent Labs  05/23/14 2330  VANCOTROUGH 26.7*    Assessment: 74 yo female presents 4/21 from [facility] with fever, lethargy and AMS; CXR at [facility] showed pneumonia. CXR w/ LLL opacity.  PMH includes CHF, CKD, FTT, DM.  Pharmacy is consulted to dose Vancomycin and Cefepime for pneumonia, fluconazole for thrush.  4/21 >> Vanc >> 4/21 >> Cefepime >> 4/23 >> fluconazole >>  4/21 MRSA PCR: positive 4/21 blood x 2: ngtd 4/21 urine: > 100k Proteus (sens pending) 4/21 Strep pneumo Ur Ag: neg 4/21 Legionella Ur Ag: IP 4/22 Cdiff: negative  4/24 Vancomycin trough at 2330 elevated at 26.7 mcg/ml, Vancomycin held  Today, 05/24/2014: Day #5 Vancomycin and Cefepime, Day #3 Fluconazole  Tmax: remains afebrile  WBC: elevated but improved, 16.8   Renal: SCr decreased to 1.87 yesterday  Vancomycin random level and SCr ordered: lab unable to obtain after 4 attempts  Goal of Therapy:  Vancomycin trough level 15-20 mcg/ml Appropriate abx dosing, eradication of infection.   Plan:   Continue Fluconazole 100mg  IV q24h for thrush.  Continue Cefepime 1g IV q24h  Continue to Hold Vancomycin  Plan Vancomycin random level in am with  SCr  Follow up renal fxn, culture results, and clinical course.  Thank you for the consult,  Minda Ditto PharmD Pager [phone] 05/24/2014, 11:30 AM

## 2014-05-24 NOTE — Progress Notes (Signed)
Progress Note   [name] CBJ:[id number] DOB: 18-Dec-1940 DOA: 05/20/2014 PCP: Gildardo Cranker, DO   Brief Narrative:   [name] is an 74 y.o. female with a PMH of diabetes with circulatory complications and retinopathy, recurrent UTI, stage IV chronic kidney disease with a baseline creatinine of 2-2.5, failure to thrive with recent hospitalization 03/01/14-03/12/14 for treatment of acute encephalopathy related to UTI and acute on chronic renal failure with recommendations for outpatient palliative care/hospice follow-up upon discharge, was admitted 05/20/14 with altered mental status and fever, treated as a code sepsis on admission.  Assessment/Plan:   Principal Problem:   Sepsis secondary to UTI versus healthcare associated pneumonia - Source of sepsis either from UTI or HCAP.  - U/A with turbid appearance and the large amount of leukocytes. Urine cultures positive for Proteus. - Chest x-ray with left mid lung opacity concerning for pneumonia. - Provided with fluid volume resuscitation on admission with stabilization of blood pressure. - Continue broad-spectrum antibiotics with cefepime and vancomycin pending final blood/urine cultures. - Strep pneumonia antigen negative/Legionella antigen still pending.  Active Problems:   Depression - Zoloft started. Cymbalta felt to be too sedating.    Oral thrush - Dysphagia 3 diet/thin liquids with aspiration precautions. - Diflucan started.    Chronic pain - Continue IV fentanyl and scheduled Tylenol per palliative care team recommendations.    Diarrhea - C. Diff negative.    Hypokalemia - Supplementing.      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 - CBGs 84-128. Continue insulin sensitive SSI.    MRSA carrier - Continue contact isolation. Decontamination therapy ordered.    Elevated troponin/demand ischemia - Patient's elevated troponin is likely from demand  ischemia in the setting of sepsis and anemia. - Repeat troponin was 1.05 --->0.36.    Normocytic anemia - Hemoglobin 9.3 status post 1 unit of PRBCs 05/21/14.  - Heme check stools, pending. 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    Blindness - Supportive care.    Chronic renal failure, stage 4 - Baseline creatinine 2-2.5, current creatinine at usual baseline values.    Acute encephalopathy - Secondary to infection. Treat infection. - CT negative for acute findings except for possible sinusitis.    Chronic diastolic CHF (congestive heart failure) - Monitor for signs of fluid volume overload.    FTT (failure to thrive) in adult - Seen by the palliative care team 05/22/14, with ongoing discussions with family regarding CODE STATUS and goals of care.    DVT Prophylaxis - SCDs ordered  Code Status: Full. Family Communication: [name] (SW) legal guardian.  Updated [name] (daughter), [name] (sister) at bedside 05/21/14.  No family at bedside today. Disposition Plan: From [facility].  Will likely return in 1-2 days, when cultures finalized and course of antibiotics defined.   IV Access:    Peripheral IV  PICC line requested by IR, IV team unable to place due to small veins.   Procedures and diagnostic studies:   Ct Head Wo Contrast 05/20/2014: 1. No evidence of acute intracranial abnormality. 2. Unchanged hydrocephalus. 3. Paranasal sinus mucosal thickening and fluid. Correlate clinically for acute sinusitis.     Dg Chest Port 1 View 05/20/2014: Focal opacification over the left mid upper lung which may be due to a pneumonia. Additional findings suggesting mild vascular congestion with stable cardiomegaly.    Medical Consultants:    Dr. Benjamine Mola  Hilma Favors, Palliative Care  Anti-Infectives:    Cefepime 05/20/14--->  Vancomycin 05/20/14--->  Diflucan 05/23/14--->  Subjective:   [name] feels a bit dyspneic  today, says "I just don't feel good", still has a cough.  Denies nausea and vomiting.  No complaints of pain.  Objective:    Filed Vitals:   05/24/14 0000 05/24/14 0100 05/24/14 0205 05/24/14 0608  BP:   124/65 112/48  Pulse: 71 72 75 69  Temp:   97.9 F (36.6 C) 97.9 F (36.6 C)  TempSrc:   Oral Oral  Resp: 16 17 18 18   Height:      Weight:   83.2 kg (183 lb 6.8 oz)   SpO2: 100% 100% 100% 100%    Intake/Output Summary (Last 24 hours) at 05/24/14 6333 Last data filed at 05/24/14 5456  Gross per 24 hour  Intake 2696.67 ml  Output      0 ml  Net 2696.67 ml    Exam: Gen:  More lethargic today, but opens eyes and answers questions Cardiovascular:  RRR, No M/R/G Respiratory:  Lungs diminished Gastrointestinal:  Abdomen soft, NT, + BS Extremities:  No C/E/C, right transtarsal amputee   Data Reviewed:    Labs: Basic Metabolic Panel:  Recent Labs Lab 05/20/14 2032 05/21/14 0330 05/22/14 0340 05/23/14 0725  NA 137 135 138 137  K 3.9 3.6 3.0* 4.9  CL 98 104 108 112  CO2 24 18* 19 18*  GLUCOSE 169* 211* 138* 112*  BUN 38* 37* 43* 41*  CREATININE 2.31* 2.11* 2.06* 1.87*  CALCIUM 7.3* 6.5* 6.9* 7.2*   GFR Estimated Creatinine Clearance: 27.8 mL/min (by C-G formula based on Cr of 1.87). Liver Function Tests:  Recent Labs Lab 05/20/14 2032  AST 111*  ALT 25  ALKPHOS 78  BILITOT 1.4*  PROT 7.2  ALBUMIN 1.8*   CBC:  Recent Labs Lab 05/20/14 2032 05/21/14 0330 05/22/14 0340 05/23/14 0725  WBC 16.4* 18.0* 24.0* 16.8*  NEUTROABS 14.1* 15.8*  --   --   HGB 7.5* 6.7* 9.3* 9.9*  HCT 24.5* 21.5* 29.2* 32.1*  MCV 89.1 88.8 87.4 90.4  PLT 243 225 226 172   Cardiac Enzymes:  Recent Labs Lab 05/20/14 2032 05/22/14 0740  TROPONINI 1.05* 0.36*   BNP (last 3 results)  Recent Labs  12/26/13 1640 12/27/13 0040  PROBNP 5360.0* 4645.0*   CBG:  Recent Labs Lab 05/23/14 0755 05/23/14 1232 05/23/14 1630 05/23/14 2305 05/24/14 0725  GLUCAP 116*  84 93 128* 111*   Sepsis Labs:  Recent Labs Lab 05/20/14 2012 05/20/14 2032 05/21/14 0330 05/22/14 0340 05/23/14 0725  WBC  --  16.4* 18.0* 24.0* 16.8*  LATICACIDVEN 1.73  --   --   --   --    Microbiology Recent Results (from the past 240 hour(s))  Urine culture     Status: None (Preliminary result)   Collection Time: 05/20/14  8:08 PM  Result Value Ref Range Status   Specimen Description URINE, CATHETERIZED  Final   Special Requests NONE  Final   Colony Count   Final    >=100,000 COLONIES/ML Performed at [facility]    Culture   Final    PROTEUS MIRABILIS Performed at [facility]    Report Status PENDING  Incomplete  Blood Culture (routine x 2)     Status: None (Preliminary result)   Collection Time: 05/20/14  8:32 PM  Result Value Ref Range Status   Specimen Description BLOOD RIGHT HAND  Final  Special Requests BOTTLES DRAWN AEROBIC AND ANAEROBIC 5CC  Final   Culture   Final           BLOOD CULTURE RECEIVED NO GROWTH TO DATE CULTURE WILL BE HELD FOR 5 DAYS BEFORE ISSUING A FINAL NEGATIVE REPORT Performed at [facility]    Report Status PENDING  Incomplete  Blood Culture (routine x 2)     Status: None (Preliminary result)   Collection Time: 05/20/14  8:41 PM  Result Value Ref Range Status   Specimen Description BLOOD LEFT HAND  Final   Special Requests BOTTLES DRAWN AEROBIC ONLY 3CC  Final   Culture   Final           BLOOD CULTURE RECEIVED NO GROWTH TO DATE CULTURE WILL BE HELD FOR 5 DAYS BEFORE ISSUING A FINAL NEGATIVE REPORT Performed at [facility]    Report Status PENDING  Incomplete  MRSA PCR Screening     Status: Abnormal   Collection Time: 05/20/14 11:37 PM  Result Value Ref Range Status   MRSA by PCR POSITIVE (A) NEGATIVE Final    Comment:        The GeneXpert MRSA Assay (FDA approved for NASAL specimens only), is one component of a comprehensive MRSA colonization surveillance program. It is not intended to  diagnose MRSA infection nor to guide or monitor treatment for MRSA infections. RESULT CALLED TO, READ BACK BY AND VERIFIED WITH: Harmon Dun RN [phone] 05/21/14 A NAVARRO   Clostridium Difficile by PCR     Status: None   Collection Time: 05/21/14  1:59 AM  Result Value Ref Range Status   C difficile by pcr NEGATIVE NEGATIVE Final     Medications:   . sodium chloride   Intravenous Once  . acetaminophen  650 mg Oral TID  . antiseptic oral rinse  7 mL Mouth Rinse q12n4p  . ceFEPime (MAXIPIME) IV  1 g Intravenous Q24H  . chlorhexidine  15 mL Mouth Rinse BID  . Chlorhexidine Gluconate Cloth  6 each Topical q morning - 10a  . feeding supplement (ENSURE ENLIVE)  237 mL Oral BID BM  . feeding supplement (GLUCERNA SHAKE)  237 mL Oral TID BM  . fluconazole (DIFLUCAN) IV  100 mg Intravenous Q24H  . insulin aspart  0-5 Units Subcutaneous QHS  . insulin aspart  0-9 Units Subcutaneous TID WC  . magic mouthwash  10 mL Oral TID  . mupirocin ointment   Nasal BID  . sertraline  25 mg Oral Daily   Continuous Infusions: . 0.9 % NaCl with KCl 40 mEq / L 100 mL/hr (05/24/14 2355)    Time spent: 25 minutes. .   LOS: 4 days   [place] [facility] Pager [phone]. If unable to reach me by pager, please call my cell phone at [phone].  *Please refer to [email or web address], password [email or web address] to get updated schedule on who will round on this patient, as hospitalists switch teams weekly. If 7PM-7AM, please contact night-coverage at [email or web address], password [email or web address] for any overnight needs.  05/24/2014, 8:33 AM

## 2014-05-24 NOTE — Progress Notes (Signed)
PHARMACY - VANCOMYCIN (brief note)  Patient on Vancomycin for r/o PNA, r/o Sepsis  Vancomycin trough level = 26.7 mcg/ml (goal 15-20 mcg/ml) Scr = 1.87 (trending down) Vanc level shows accumulation  Plan:    D/C standing order for vancomycin at this time due to elevated trough level  Check Scr & random vancomycin level @ 10:00 4/25 and re-dose when level < 20 mcg/ml  Leone Haven, PharmD

## 2014-05-25 ENCOUNTER — Encounter (HOSPITAL_COMMUNITY): Payer: Self-pay | Admitting: Internal Medicine

## 2014-05-25 DIAGNOSIS — R4182 Altered mental status, unspecified: Secondary | ICD-10-CM

## 2014-05-25 DIAGNOSIS — N10 Acute tubulo-interstitial nephritis: Secondary | ICD-10-CM

## 2014-05-25 HISTORY — DX: Acute pyelonephritis: N10

## 2014-05-25 LAB — GLUCOSE, CAPILLARY
Glucose-Capillary: 64 mg/dL — ABNORMAL LOW (ref 70–99)
Glucose-Capillary: 68 mg/dL — ABNORMAL LOW (ref 70–99)
Glucose-Capillary: 84 mg/dL (ref 70–99)
Glucose-Capillary: 107 mg/dL — ABNORMAL HIGH (ref 70–99)
Glucose-Capillary: 82 mg/dL (ref 70–99)

## 2014-05-25 LAB — BASIC METABOLIC PANEL
Anion gap: 8 (ref 5–15)
BUN: 39 mg/dL — ABNORMAL HIGH (ref 6–23)
CALCIUM: 7.9 mg/dL — AB (ref 8.4–10.5)
CHLORIDE: 114 mmol/L — AB (ref 96–112)
CO2: 17 mmol/L — ABNORMAL LOW (ref 19–32)
Creatinine, Ser: 1.71 mg/dL — ABNORMAL HIGH (ref 0.50–1.10)
GFR calc Af Amer: 33 mL/min — ABNORMAL LOW (ref >90)
GFR calc non Af Amer: 29 mL/min — ABNORMAL LOW (ref >90)
Glucose, Bld: 96 mg/dL (ref 70–99)
POTASSIUM: 5.3 mmol/L — AB (ref 3.5–5.1)
SODIUM: 139 mmol/L (ref 135–145)

## 2014-05-25 LAB — CBC
HCT: 28.8 % — ABNORMAL LOW (ref 36.0–46.0)
Hemoglobin: 8.7 g/dL — ABNORMAL LOW (ref 12.0–15.0)
MCH: 27.4 pg (ref 26.0–34.0)
MCHC: 30.2 g/dL (ref 30.0–36.0)
MCV: 90.6 fL (ref 78.0–100.0)
Platelets: 323 10*3/uL (ref 150–400)
RBC: 3.18 MIL/uL — AB (ref 3.87–5.11)
RDW: 18.7 % — ABNORMAL HIGH (ref 11.5–15.5)
WBC: 15.6 10*3/uL — ABNORMAL HIGH (ref 4.0–10.5)

## 2014-05-25 LAB — OCCULT BLOOD X 1 CARD TO LAB, STOOL: Fecal Occult Bld: POSITIVE — AB

## 2014-05-25 LAB — VANCOMYCIN, RANDOM: VANCOMYCIN RM: 21.3 ug/mL

## 2014-05-25 MED ORDER — SERTRALINE HCL 20 MG/ML PO CONC: 25.0000 mg | Freq: Every day | ORAL | Status: AC

## 2014-05-25 MED ORDER — CEFUROXIME AXETIL 500 MG PO TABS
500.0000 mg | ORAL_TABLET | Freq: Two times a day (BID) | ORAL | Status: DC
  Administered 2014-05-25: 500 mg via ORAL
  Filled 2014-05-25 (×3): qty 1

## 2014-05-25 MED ORDER — MORPHINE SULFATE (CONCENTRATE) 10 MG/0.5ML PO SOLN: 10.0000 mg | ORAL | Status: DC | PRN

## 2014-05-25 MED ORDER — MORPHINE SULFATE (CONCENTRATE) 10 MG/0.5ML PO SOLN: 10.0000 mg | ORAL | Status: AC | PRN

## 2014-05-25 MED ORDER — CEFTRIAXONE SODIUM IN DEXTROSE 20 MG/ML IV SOLN
1.0000 g | INTRAVENOUS | Status: DC
  Filled 2014-05-25: qty 50

## 2014-05-25 MED ORDER — CEFUROXIME AXETIL 500 MG PO TABS: 500.0000 mg | ORAL_TABLET | Freq: Two times a day (BID) | ORAL | Status: AC

## 2014-05-25 MED ORDER — FLUCONAZOLE 100 MG PO TABS: 100.0000 mg | ORAL_TABLET | Freq: Every day | ORAL | Status: AC

## 2014-05-25 MED ORDER — FLUCONAZOLE 100 MG PO TABS
100.0000 mg | ORAL_TABLET | Freq: Every day | ORAL | Status: DC
  Administered 2014-05-25: 100 mg via ORAL
  Filled 2014-05-25: qty 1

## 2014-05-25 MED ORDER — MAGIC MOUTHWASH: 10.0000 mL | Freq: Three times a day (TID) | ORAL | Status: DC

## 2014-05-25 NOTE — Discharge Instructions (Signed)
Urosepsis Urosepsis is an infection that has spread to the bloodstream from the urinary tract. It is a severe illness. If it is not treated immediately, urosepsis can be life threatening. CAUSES  The cause of urosepsis is not known.  RISK FACTORS  Advanced age.  Having diabetes mellitus.  Having a weakened immune system.  Having kidney stones. SIGNS AND SYMPTOMS   Fever or low body temperature (hypothermia).  Rapid breathing (hyperventilation).  Chills.  Rapid heartbeat (tachycardia). When sepsis is severe, low blood pressure and decreased oxygen flow to your vital organs may also occur. This is called shock. DIAGNOSIS  Your health care provider may suspect urosepsis based on your medical history and a physical exam. Urine and blood tests may be done to confirm a diagnosis of urosepsis. Other tests, such as X-ray exams, ultrasound exams, or a CT scan, may be done to determine the severity of your condition.  TREATMENT  Urosepsis requires prompt treatment with medicines. You will receive fluids through an IV tube to support your blood pressure. You may also receive medicines to increase your blood pressure and medicines to control pain or nausea.  HOME CARE INSTRUCTIONS   Take medicines only as directed by your health care provider.  Drink enough fluids to keep your urine clear or pale yellow. In addition to water, cranberry juice is recommended.  Avoid caffeine, tea, and carbonated beverages. They can irritate the bladder.  Avoid alcohol. It may irritate the prostate, if this applies.  Get plenty of rest. Increase your activity as tolerated.  Keep all follow-up visits as directed by your health care provider.  Empty your bladder often. Avoid holding urine for long periods of time.  Empty your bladder before and after sexual intercourse.  After a bowel movement, women should wipe from front to back using each tissue only once. SEEK MEDICAL CARE IF:   You develop back  pain.  You develop nausea or vomiting.  Your symptoms are not better after 3 days.  You develop a fever or chills. SEEK IMMEDIATE MEDICAL CARE IF:   You feel light-headed or develop shortness of breath.  You are getting worse, not better. MAKE SURE YOU:  Understand these instructions.  Will watch your condition.  Will get help right away if you are not doing well or get worse. Document Released: 01/15/2005 Document Revised: 06/01/2013 Document Reviewed: 09/22/2012 Exit[employer] Patient Information 2015 [employer]. This information is not intended to replace advice given to you by your health care provider. Make sure you discuss any questions you have with your health care provider.  

## 2014-05-25 NOTE — Progress Notes (Signed)
Called report to [place], Therapist, sports at [facility]. Left number if she had any additional questions.

## 2014-05-25 NOTE — Discharge Summary (Addendum)
Physician Discharge Summary  [name] UXN:[id number] DOB: 03/09/1940 DOA: 05/20/2014  PCP: Gildardo Cranker, DO  Admit date: 05/20/2014 Discharge date: 05/25/2014   Recommendations for Outpatient Follow-Up:   1. Recommend ongoing evaluation/referral to palliative care team at d/c. 2. F/U final blood culture results. 3. Maintain supplemental oxygen to keep saturations > 92%.   Discharge Diagnosis:   Principal Problem:   Sepsis secondary to Proteus Mirabilis UTI / Pyelonephritis Active Problems:    Type 2 diabetes, controlled, with peripheral circulatory disorder    Blindness    UTI (lower urinary tract infection)    Acute encephalopathy    Chronic diastolic CHF (congestive heart failure)    FTT (failure to thrive) in adult    HCAP (healthcare-associated pneumonia)    MRSA carrier    Chronic kidney disease (CKD), stage IV (severe)    Hypokalemia    Diarrhea    Thrush, oral    Generalized pain    Severe depression    Depression    Oral thrush    Chronic pain syndrome   Discharge disposition:  SNF:  [facility]  Discharge Condition: Stable but high risk for re-hospitalization.  Diet recommendation: Carbohydrate-modified. Dysphagia III with aspiration precautions.   Wound care: Soft silicone dressings to buttocks.  Recommend pressure reduction techniques, high risk for skin breakdown related to immobility.   History of Present Illness:   [name] is an 74 y.o. female with a PMH of diabetes with circulatory complications and retinopathy, recurrent UTI, stage IV chronic kidney disease with a baseline creatinine of 2-2.5, failure to thrive with recent hospitalization 03/01/14-03/12/14 for treatment of acute encephalopathy related to UTI and acute on chronic renal failure with recommendations for outpatient palliative care/hospice follow-up upon discharge, was admitted 05/20/14 with altered mental status and fever, treated as a code sepsis on  admission.  Hospital Course by Problem:   Principal Problem:  Sepsis secondary to Proteus Mirabilis UTI/pyelonephritis versus healthcare associated pneumonia - Source of sepsis either from UTI or HCAP, but probably Proteus Mirabilis Pyelonephritis (given diarrhea/pain).  - U/A with turbid appearance and the large amount of leukocytes. Urine cultures positive for Proteus. - Chest x-ray with left mid lung opacity concerning for pneumonia. - Antibiotics narrowed to Rocephin 05/25/14 given positive urine culture for Proteus, D/C on 2 more days of Ceftin. - Provided with fluid volume resuscitation on admission with stabilization of blood pressure. - Strep pneumonia and legionella antigen negative.  Active Problems:  Depression - Zoloft started. Cymbalta felt to be too sedating.   Oral thrush - Dysphagia 3 diet/thin liquids with aspiration precautions. - Diflucan and MMW started.  Continue x 5 more days.     Chronic pain - D/C home on tylenol and PRN roxanol.   Diarrhea - C. Diff negative.  Likely from pyelonephritis.   Hypokalemia - Supplemented.    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 - CBGs 64-107. Treated with insulin sensitive SSI while in the hospital. - CM diet at discharge.   MRSA carrier - Maintained on contact isolation. Decontamination therapy ordered.   Elevated troponin/demand ischemia - Patient's elevated troponin is likely from demand ischemia in the setting of sepsis and anemia. - Repeat troponin was 1.05 --->0.36.   Normocytic anemia - Hemoglobin 9.3 status post 1 unit of PRBCs 05/21/14.  - No evidence of GI bleeding. 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 - D/C hemoglobin 8.7.  Blindness - Supportive care.   Chronic renal failure, stage 4 - Baseline creatinine 2-2.5, current creatinine at usual baseline values.   Acute  encephalopathy - Secondary to infection. Back to baseline. - CT negative for acute findings except for possible sinusitis.   Chronic diastolic CHF (congestive heart failure) - Monitor for signs of fluid volume overload.   FTT (failure to thrive) in adult - Seen by the palliative care team 05/22/14, with ongoing discussions with family regarding CODE STATUS and goals of care.    Medical Consultants:    Dr. Rhea Pink, Palliative Care.   Discharge Exam:   Filed Vitals:   05/25/14 0510  BP: 114/73  Pulse: 69  Temp: 97.4 F (36.3 C)  Resp: 16   Filed Vitals:   05/24/14 0608 05/24/14 1454 05/24/14 2132 05/25/14 0510  BP: 112/48 119/51 120/58 114/73  Pulse: 69 71 68 69  Temp: 97.9 F (36.6 C) 98.4 F (36.9 C) 97.8 F (36.6 C) 97.4 F (36.3 C)  TempSrc: Oral Oral Oral Oral  Resp: 18 20 18 16   Height:      Weight:      SpO2: 100% 99% 98% 100%    Gen:  NAD Cardiovascular:  RRR, No M/R/G Respiratory: Lungs diminished Gastrointestinal: Abdomen soft, NT/ND with normal active bowel sounds. Extremities: Trace edema LEs, edema UE.  Right transtarsal amputee   The results of significant diagnostics from this hospitalization (including imaging, microbiology, ancillary and laboratory) are listed below for reference.     Procedures and Diagnostic Studies:   Ct Head Wo Contrast 05/20/2014: 1. No evidence of acute intracranial abnormality. 2. Unchanged hydrocephalus. 3. Paranasal sinus mucosal thickening and fluid. Correlate clinically for acute sinusitis.   Dg Chest Port 1 View 05/20/2014: Focal opacification over the left mid upper lung which may be due to a pneumonia. Additional findings suggesting mild vascular congestion with stable cardiomegaly.    Labs:   Basic Metabolic Panel:  Recent Labs Lab 05/20/14 2032 05/21/14 0330 05/22/14 0340 05/23/14 0725 05/25/14 0520  NA 137 135 138 137 139  K 3.9 3.6 3.0* 4.9 5.3*  CL 98 104 108 112 114*  CO2 24 18* 19  18* 17*  GLUCOSE 169* 211* 138* 112* 96  BUN 38* 37* 43* 41* 39*  CREATININE 2.31* 2.11* 2.06* 1.87* 1.71*  CALCIUM 7.3* 6.5* 6.9* 7.2* 7.9*   GFR Estimated Creatinine Clearance: 30.4 mL/min (by C-G formula based on Cr of 1.71). Liver Function Tests:  Recent Labs Lab 05/20/14 2032  AST 111*  ALT 25  ALKPHOS 78  BILITOT 1.4*  PROT 7.2  ALBUMIN 1.8*   CBC:  Recent Labs Lab 05/20/14 2032 05/21/14 0330 05/22/14 0340 05/23/14 0725 05/25/14 0520  WBC 16.4* 18.0* 24.0* 16.8* 15.6*  NEUTROABS 14.1* 15.8*  --   --   --   HGB 7.5* 6.7* 9.3* 9.9* 8.7*  HCT 24.5* 21.5* 29.2* 32.1* 28.8*  MCV 89.1 88.8 87.4 90.4 90.6  PLT 243 225 226 172 323   Cardiac Enzymes:  Recent Labs Lab 05/20/14 2032 05/22/14 0740  TROPONINI 1.05* 0.36*   BNP: Invalid input(s): POCBNP CBG:  Recent Labs Lab 05/24/14 2105 05/25/14 0112 05/25/14 0147 05/25/14 0219 05/25/14 0730  GLUCAP 83 64* 37* 56 107*   Microbiology Recent Results (from the past 240 hour(s))  Urine culture     Status: None   Collection Time: 05/20/14  8:08 PM  Result Value Ref Range Status   Specimen Description URINE, CATHETERIZED  Final   Special  Requests NONE  Final   Colony Count   Final    >=100,000 COLONIES/ML Performed at [facility]    Culture   Final    PROTEUS MIRABILIS Performed at [facility]    Report Status 05/24/2014 FINAL  Final   Organism ID, Bacteria PROTEUS MIRABILIS  Final      Susceptibility   Proteus mirabilis - MIC*    AMPICILLIN 16 INTERMEDIATE Intermediate     CEFAZOLIN >=64 RESISTANT Resistant     CEFTRIAXONE <=1 SENSITIVE Sensitive     CIPROFLOXACIN >=4 RESISTANT Resistant     GENTAMICIN >=16 RESISTANT Resistant     LEVOFLOXACIN >=8 RESISTANT Resistant     NITROFURANTOIN 128 RESISTANT Resistant     TOBRAMYCIN 8 INTERMEDIATE Intermediate     TRIMETH/SULFA >=320 RESISTANT Resistant     PIP/TAZO <=4 SENSITIVE Sensitive     * PROTEUS MIRABILIS  Blood Culture  (routine x 2)     Status: None (Preliminary result)   Collection Time: 05/20/14  8:32 PM  Result Value Ref Range Status   Specimen Description BLOOD RIGHT HAND  Final   Special Requests BOTTLES DRAWN AEROBIC AND ANAEROBIC 5CC  Final   Culture   Final           BLOOD CULTURE RECEIVED NO GROWTH TO DATE CULTURE WILL BE HELD FOR 5 DAYS BEFORE ISSUING A FINAL NEGATIVE REPORT Performed at [facility]    Report Status PENDING  Incomplete  Blood Culture (routine x 2)     Status: None (Preliminary result)   Collection Time: 05/20/14  8:41 PM  Result Value Ref Range Status   Specimen Description BLOOD LEFT HAND  Final   Special Requests BOTTLES DRAWN AEROBIC ONLY 3CC  Final   Culture   Final           BLOOD CULTURE RECEIVED NO GROWTH TO DATE CULTURE WILL BE HELD FOR 5 DAYS BEFORE ISSUING A FINAL NEGATIVE REPORT Performed at [facility]    Report Status PENDING  Incomplete  MRSA PCR Screening     Status: Abnormal   Collection Time: 05/20/14 11:37 PM  Result Value Ref Range Status   MRSA by PCR POSITIVE (A) NEGATIVE Final    Comment:        The GeneXpert MRSA Assay (FDA approved for NASAL specimens only), is one component of a comprehensive MRSA colonization surveillance program. It is not intended to diagnose MRSA infection nor to guide or monitor treatment for MRSA infections. RESULT CALLED TO, READ BACK BY AND VERIFIED WITH: Harmon Dun RN [phone] 05/21/14 A NAVARRO   Clostridium Difficile by PCR     Status: None   Collection Time: 05/21/14  1:59 AM  Result Value Ref Range Status   C difficile by pcr NEGATIVE NEGATIVE Final     Discharge Instructions:   Discharge Instructions    Call MD for:  extreme fatigue    Complete by:  As directed      Call MD for:  persistant nausea and vomiting    Complete by:  As directed      Call MD for:  severe uncontrolled pain    Complete by:  As directed      Call MD for:  temperature >100.4    Complete by:  As directed       Diet Carb Modified    Complete by:  As directed      Increase activity slowly    Complete by:  As  directed             Medication List    STOP taking these medications        collagenase ointment  Commonly known as:  SANTYL     DULoxetine 30 MG capsule  Commonly known as:  CYMBALTA     insulin aspart 100 UNIT/ML injection  Commonly known as:  novoLOG     mirtazapine 30 MG tablet  Commonly known as:  REMERON      TAKE these medications        aspirin EC 325 MG tablet  Take 325 mg by mouth daily.     carvedilol 3.125 MG tablet  Commonly known as:  COREG  Take 3.125 mg by mouth 2 (two) times daily with a meal.     cefUROXime 500 MG tablet  Commonly known as:  CEFTIN  Take 1 tablet (500 mg total) by mouth 2 (two) times daily with a meal.     DECUBI-VITE Caps  Take 2 capsules by mouth daily.     docusate sodium 100 MG capsule  Commonly known as:  COLACE  Take 100 mg by mouth 2 (two) times daily.     feeding supplement (GLUCERNA SHAKE) Liqd  Take 237 mLs by mouth 3 (three) times daily between meals.     fluconazole 100 MG tablet  Commonly known as:  DIFLUCAN  Take 1 tablet (100 mg total) by mouth daily.     hydrocortisone 2.5 % rectal cream  Commonly known as:  ANUSOL-HC  Place 1 application rectally 2 (two) times daily as needed for hemorrhoids.     ipratropium-albuterol 0.5-2.5 (3) MG/3ML Soln  Commonly known as:  DUONEB  Take 3 mLs by nebulization every 6 (six) hours as needed (wheezing).     magic mouthwash Soln  Take 10 mLs by mouth 3 (three) times daily.     metoCLOPramide 5 MG tablet  Commonly known as:  REGLAN  Take 1 tablet (5 mg total) by mouth 4 (four) times daily -  before meals and at bedtime.     morphine CONCENTRATE 10 MG/0.5ML Soln concentrated solution  Take 0.5 mLs (10 mg total) by mouth every 2 (two) hours as needed for severe pain.     polyethylene glycol packet  Commonly known as:  MIRALAX / GLYCOLAX  Take 17 g by mouth daily.       PRO-STAT Liqd  Take 30 mLs by mouth 2 (two) times daily.     promethazine 25 MG tablet  Commonly known as:  PHENERGAN  Take 25 mg by mouth every 6 (six) hours as needed for nausea or vomiting.     sertraline 20 MG/ML concentrated solution  Commonly known as:  ZOLOFT  Take 1.3 mLs (26 mg total) by mouth daily.     SIMBRINZA 1-0.2 % Susp  Generic drug:  Brinzolamide-Brimonidine  Place 1 drop into the left eye 2 (two) times daily.     sodium chloride 0.65 % Soln nasal spray  Commonly known as:  OCEAN  Place 1 spray into both nostrils every 6 (six) hours as needed for congestion.     TGT CALCIUM DIETARY SUPPLEMENT PO  Take 2 tablets by mouth 3 (three) times daily.     timolol 0.5 % ophthalmic solution  Commonly known as:  TIMOPTIC  Place 1 drop into the left eye 2 (two) times daily.     TYLENOL 325 MG tablet  Generic drug:  acetaminophen  Take 650 mg by mouth  3 (three) times daily.           Follow-up Information    Follow up with Gildardo Cranker, DO.   Specialty:  Internal Medicine   Contact information:   [place] [address] [phone]        Time coordinating discharge: 35 minutes.  Signed:  Aden Sek  Pager [phone] [facility] 05/25/2014, 9:58 AM

## 2014-05-26 ENCOUNTER — Non-Acute Institutional Stay (SKILLED_NURSING_FACILITY): Payer: Medicare Other | Admitting: Adult Health

## 2014-05-26 ENCOUNTER — Encounter: Payer: Self-pay | Admitting: Adult Health

## 2014-05-26 DIAGNOSIS — E1159 Type 2 diabetes mellitus with other circulatory complications: Secondary | ICD-10-CM

## 2014-05-26 DIAGNOSIS — G894 Chronic pain syndrome: Secondary | ICD-10-CM | POA: Diagnosis not present

## 2014-05-26 DIAGNOSIS — N184 Chronic kidney disease, stage 4 (severe): Secondary | ICD-10-CM

## 2014-05-26 DIAGNOSIS — R627 Adult failure to thrive: Secondary | ICD-10-CM

## 2014-05-26 DIAGNOSIS — N390 Urinary tract infection, site not specified: Secondary | ICD-10-CM

## 2014-05-26 DIAGNOSIS — E1151 Type 2 diabetes mellitus with diabetic peripheral angiopathy without gangrene: Secondary | ICD-10-CM

## 2014-05-26 DIAGNOSIS — I5032 Chronic diastolic (congestive) heart failure: Secondary | ICD-10-CM | POA: Diagnosis not present

## 2014-05-26 DIAGNOSIS — B370 Candidal stomatitis: Secondary | ICD-10-CM

## 2014-05-26 DIAGNOSIS — N10 Acute tubulo-interstitial nephritis: Secondary | ICD-10-CM

## 2014-05-26 DIAGNOSIS — I639 Cerebral infarction, unspecified: Secondary | ICD-10-CM | POA: Diagnosis not present

## 2014-05-26 DIAGNOSIS — I6350 Cerebral infarction due to unspecified occlusion or stenosis of unspecified cerebral artery: Secondary | ICD-10-CM

## 2014-05-26 DIAGNOSIS — K219 Gastro-esophageal reflux disease without esophagitis: Secondary | ICD-10-CM

## 2014-05-26 DIAGNOSIS — F329 Major depressive disorder, single episode, unspecified: Secondary | ICD-10-CM | POA: Diagnosis not present

## 2014-05-26 DIAGNOSIS — F322 Major depressive disorder, single episode, severe without psychotic features: Secondary | ICD-10-CM

## 2014-05-26 NOTE — Progress Notes (Signed)
Patient ID: [name], female   DOB: 03-31-1940, 74 y.o.   MRN: [id number]  [facility]     No Known Allergies     Chief Complaint  Patient presents with  . Medical Management of Chronic Issues  . Acute Visit    family meeting     HPI:  She is a long term resident of this facility being seen for the management of her chronic illnesses. She continues to decline she has lost weight from 198 pounds in Oct 2015; 183 pounds in march 2016 to her current weight of 165 pounds. Myself and the social worker has had an extensive discussion regarding her overall status; her lack of po intake; and her continued decline. Her daughter feels as those her mother is "starving to death". She does not seem to understand that no one can force [name] to eat. We cannot violate her rights in such a way. Her daughter is frustrated with the situation.     Past Medical History  Diagnosis Date  . Hypertension   . Diabetes mellitus   . Blindness   . TIA (transient ischemic attack)   . Anemia   . GERD (gastroesophageal reflux disease)   . Constipation   . Renal disorder 05/2012    acute Renal Failure  . SYNCOPE 05/19/2009    Annotation: multiple over past 1 yr  Qualifier: Diagnosis of  By: [name] MD, [name]    . CHOLECYSTECTOMY, HX OF 05/19/2009    Qualifier: Diagnosis of  By: [name] MD, [name]    . 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 Chronic osteomyelitis of toe of right foot 09/19/2013    3rd, 4th, 5th   . Acute pyelonephritis 05/25/2014  . Oral thrush 05/23/2014  . Chronic kidney disease (CKD), stage IV (severe) 05/21/2014  . HCAP (healthcare-associated pneumonia) 05/20/2014  . Chronic diastolic CHF (congestive heart failure) 03/02/2014    Past Surgical History  Procedure Laterality Date  . Gallbladder surgery    . Esophageal dilation    . Cholecystectomy    . Amputation  02/18/2012    Procedure: AMPUTATION RAY;  Surgeon: [name], MD;  Location: [facility];  Service: Orthopedics;  Laterality: Right;   right second toe  . Tee without cardioversion  02/21/2012    Procedure: TRANSESOPHAGEAL ECHOCARDIOGRAM (TEE);  Surgeon: [name], MD;  Location: [facility];  Service: Cardiovascular;  Laterality: N/A;   spoke with [name] from [place] pt will arrive by 815ish( thy change shifts at [facility])  . Tee without cardioversion  02/25/2012    Procedure: TRANSESOPHAGEAL ECHOCARDIOGRAM (TEE);  Surgeon: [name], MD;  Location: [facility];  Service: Cardiovascular;  Laterality: N/A;  . Amputation Right 09/07/2013    Procedure: AMPUTATION RAY;  Surgeon: [name], MD;  Location: [facility];  Service: Orthopedics;  Laterality: Right;  Right Transmetatarsal Amputation    VITAL SIGNS BP 124/62 mmHg  Pulse 74  Ht 4\' 10"  (1.473 m)  Wt 165 lb (74.844 kg)  BMI 34.49 kg/m2  SpO2 98%   Outpatient Encounter Prescriptions as of 05/04/2014  Medication Sig  . acetaminophen (TYLENOL) 325 MG tablet Take 650 mg by mouth 3 (three) times daily.   . Amino Acids-Protein Hydrolys (PRO-STAT) LIQD Take 30 mLs by mouth 2 (two) times daily.  Marland Kitchen aspirin EC 325 MG tablet Take 325 mg by mouth daily.    . Brinzolamide-Brimonidine (SIMBRINZA) 1-0.2 % SUSP Place 1 drop into the left eye 2 (two) times daily.  . carvedilol (COREG) 3.125 MG  tablet Take 3.125 mg by mouth 2 (two) times daily with a meal.  . docusate sodium (COLACE) 100 MG capsule Take 100 mg by mouth 2 (two) times daily.  . hydrocortisone (ANUSOL-HC) 2.5 % rectal cream Place 1 application rectally 2 (two) times daily as needed for hemorrhoids.   Marland Kitchen ipratropium-albuterol (DUONEB) 0.5-2.5 (3) MG/3ML SOLN Take 3 mLs by nebulization every 6 (six) hours as needed (wheezing).  . metoCLOPramide (REGLAN) 5 MG tablet Take 1 tablet (5 mg total) by mouth 4 (four) times daily -  before meals and at bedtime.   remeron odt 15 mg  Take 15 mg nightly   . polyethylene glycol (MIRALAX / GLYCOLAX) packet Take 17 g by mouth daily.  . timolol (TIMOPTIC) 0.5 % ophthalmic solution Place 1  drop into the left eye 2 (two) times daily.     SIGNIFICANT DIAGNOSTIC EXAMS   12-20-13: mri right foot: 1. No findings of osteomyelitis or abscess, but the soft tissues laterally along the stump appear very thin overlying the distal fourth and fifth metatarsal amputation margins dorsally, possibly from ulceration or soft tissue breakdown.  12-26-13: chest x-ray: Cardiomegaly. Mild interstitial prominence bilaterally suspicious for mild interstitial edema. No segmental infiltrate  12-26-13: ct of abdomen and pelvis: 1. No acute abnormality. 2. Bilateral vascular renal calcifications and at least 1 nonobstructing right renal calculus.3. Small bilateral pleural effusions.4. Mild right lower lobe cylindrical bronchiectasis.5. Mild bibasilar atelectasis. 6. Dense coronary artery atheromatous calcifications.  12-27-13: 2-d echo: Mild LVH with LVEF 50-55%, inferolateral hypokinesis, grade 1 diastolic dysfunction with increased filling pressures. Mild left atrial enlargement. MAC with moderate mitral regurgitation. Mild tricuspid regurgitation with PASP 49 mmHg.  12-28-13: ct of head: No acute intracranial abnormality. Stable hydrocephalus.  Minimal change since 12/18/2013.  12-29-13: chest x-ray: Bronchitic changes with posterior density at the lung bases on lateral view question pleural effusions, cannot exclude RIGHT lower lobe consolidation.  12-29-13: renal ultrasound: No hydronephrosis or evidence of nephrolithiasis.  01-01-14: chest x-ray: Low lung volumes with increase in bibasilar atelectasis. Mild interstitial edema and stable cardiomegaly.  02-26-14: chest x-ray: mild congestion       LABS REVIEWED:   12-18-13: hgb a1c 6.2; tsh 0.858 12-27-13: wbc 9.5; hgb 10.1; hct 32.0; mcv 90.1; plt 275; glucose 169; bun 22; creat 0.97; k+3.9; na++142; liver normal albumin 2.3; BNP 4645 12-28-13: ammonia 26 12-30-13: glucose 120; bun 24; creat 2.1; k+ 4.0; na++142 01-05-14: glucose 79; bun 28;  creat 2.46; k+4.2; na++ 140  01-11-14: glucose 147; bun 21.6; creat 1.52; k+4.8; na++140  03-20-14: wbc 7.4; hgb 8.9; hct 32.1; mcv 94.4; plt 261; glucose 187; bun 19.6; creat 2.05; k+3.6; na++142 04-06-14: wbc 4.1; hgb 9.6; hct 30.7; mcv 89.7 plt 212; glucose 91; bun 15; creat 1.26; k+3.9;  na++136 liver normal albumin 2.6     ROS Unable to perform ROS     Physical Exam Constitutional: No distress.  Obese   Neck: Neck supple. No JVD present. No thyromegaly present.  Cardiovascular: Normal rate and regular rhythm.   Pedal pulses present status post right metatarsal amputation   Respiratory: Effort normal and breath sounds normal. No respiratory distress.  GI: Soft. Bowel sounds are normal. She exhibits no distension.  Genitourinary:  Has foley   Musculoskeletal: She exhibits no edema.  Is able to move all extremities   Neurological: She is alert.  Skin: Skin is warm and dry. She is not diaphoretic.     ASSESSMENT/ PLAN:   1. Diabetes: her hgb a1c  is 6.2 her insulin has been stopped; will monitor   2. systolic and diastolic heart failure: Will continue coreg 3.125 mg twice daily and will monitor   3. peripheral neuropathy:is presently not on medications; will monitor her status.   4.dyslipidemia: is off medicatoins will monitor her status.   5. GERD: is stable will continue reglan 5 mg four times daily will monitor   6. Constipation: will continue colace twice daily; and miralax daily   7. TIA: is neurologically stable; will continue asa 325 mg daily   8. Glaucoma: will continue simbrinza to left eye twice daily and timoptic to left eye twice daily   9. Depression: she is without change her cymbalta was stopped and she was placed on remeron 5 mg nightly   10. Urine retention: has foley long term will monitor has completed treatment for recent uti.  11. Weight loss/FTT: will continue remeron 15 mg nightly  and will monitor her status. Will have palliative care see  her   Time spent with patient 50 minutes.     Ok Edwards NP [facility]  Contact [phone] Monday through Friday 8am- 5pm  After hours call [phone]

## 2014-05-26 NOTE — Progress Notes (Signed)
Patient ID: [name], female   DOB: July 26, 1940, 74 y.o.   MRN: [id number]  [facility]     No Known Allergies     Chief Complaint  Patient presents with  . Hospitalization Follow-up    HPI:  She is a long term resident of this facility who was hospitalized for acute pyelonephritis with acute on chronic renal failure. She did receive one unit rbc's. She is unable to fully participate in the phi or ros. She is not complaining of pain at this time. Her po intake remains very poor.    Past Medical History  Diagnosis Date  . Hypertension   . Diabetes mellitus   . Blindness   . TIA (transient ischemic attack)   . Anemia   . GERD (gastroesophageal reflux disease)   . Constipation   . Renal disorder 05/2012    acute Renal Failure  . SYNCOPE 05/19/2009    Annotation: multiple over past 1 yr  Qualifier: Diagnosis of  By: [name] MD, [name]    . CHOLECYSTECTOMY, HX OF 05/19/2009    Qualifier: Diagnosis of  By: [name] MD, [name]    . 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 Chronic osteomyelitis of toe of right foot 09/19/2013    3rd, 4th, 5th   . Acute pyelonephritis 05/25/2014  . Oral thrush 05/23/2014  . Chronic kidney disease (CKD), stage IV (severe) 05/21/2014  . HCAP (healthcare-associated pneumonia) 05/20/2014  . Chronic diastolic CHF (congestive heart failure) 03/02/2014    Past Surgical History  Procedure Laterality Date  . Gallbladder surgery    . Esophageal dilation    . Cholecystectomy    . Amputation  02/18/2012    Procedure: AMPUTATION RAY;  Surgeon: [name], MD;  Location: [facility];  Service: Orthopedics;  Laterality: Right;  right second toe  . Tee without cardioversion  02/21/2012    Procedure: TRANSESOPHAGEAL ECHOCARDIOGRAM (TEE);  Surgeon: [name], MD;  Location: [facility] ENDOSCOPY;  Service: Cardiovascular;  Laterality: N/A;   spoke with [name] from [place] pt will arrive by 815ish( thy change shifts at [place])  . Tee without cardioversion  02/25/2012    Procedure:  TRANSESOPHAGEAL ECHOCARDIOGRAM (TEE);  Surgeon: [name], MD;  Location: [place];  Service: Cardiovascular;  Laterality: N/A;  . Amputation Right 09/07/2013    Procedure: AMPUTATION RAY;  Surgeon: [name], MD;  Location: [place];  Service: Orthopedics;  Laterality: Right;  Right Transmetatarsal Amputation    VITAL SIGNS BP 138/73 mmHg  Pulse 71  Ht 5\' 4"  (1.626 m)  Wt 170 lb (77.111 kg)  BMI 29.17 kg/m2  SpO2 97%   Outpatient Encounter Prescriptions as of 05/26/2014  Medication Sig  . acetaminophen (TYLENOL) 325 MG tablet Take 650 mg by mouth 3 (three) times daily.   . Amino Acids-Protein Hydrolys (PRO-STAT) LIQD Take 30 mLs by mouth 2 (two) times daily.  Marland Kitchen aspirin EC 325 MG tablet Take 325 mg by mouth daily.    . Brinzolamide-Brimonidine (SIMBRINZA) 1-0.2 % SUSP Place 1 drop into the left eye 2 (two) times daily.  . Calcium Carbonate-Vitamin D (TGT CALCIUM DIETARY SUPPLEMENT PO) Take 2 tablets by mouth 3 (three) times daily.  . carvedilol (COREG) 3.125 MG tablet Take 3.125 mg by mouth 2 (two) times daily with a meal.  . cefUROXime (CEFTIN) 500 MG tablet Take 1 tablet (500 mg total) by mouth 2 (two) times daily with a meal.  . docusate sodium (COLACE) 100 MG capsule Take 100 mg by mouth 2 (two)  times daily.  . fluconazole (DIFLUCAN) 100 MG tablet Take 1 tablet (100 mg total) by mouth daily.  . hydrocortisone (ANUSOL-HC) 2.5 % rectal cream Place 1 application rectally 2 (two) times daily as needed for hemorrhoids.   Marland Kitchen ipratropium-albuterol (DUONEB) 0.5-2.5 (3) MG/3ML SOLN Take 3 mLs by nebulization every 6 (six) hours as needed (wheezing).  . metoCLOPramide (REGLAN) 5 MG tablet Take 1 tablet (5 mg total) by mouth 4 (four) times daily -  before meals and at bedtime.  . Morphine Sulfate (MORPHINE CONCENTRATE) 10 MG/0.5ML SOLN concentrated solution Take 0.5 mLs (10 mg total) by mouth every 2 (two) hours as needed for severe pain.  . Multiple Vitamins-Minerals (DECUBI-VITE) CAPS  Take 2 capsules by mouth daily.  . NON FORMULARY Take 240 mLs by mouth 3 (three) times daily. 2-cal supplement 240 cc three times daily  . OXYGEN Inhale 2 L into the lungs continuous.  . polyethylene glycol (MIRALAX / GLYCOLAX) packet Take 17 g by mouth daily.  . promethazine (PHENERGAN) 25 MG tablet Take 25 mg by mouth every 6 (six) hours as needed for nausea or vomiting.  . sertraline (ZOLOFT) 20 MG/ML concentrated solution Take 1.3 mLs (26 mg total) by mouth daily.  . sodium chloride (OCEAN) 0.65 % SOLN nasal spray Place 1 spray into both nostrils every 6 (six) hours as needed for congestion.  . timolol (TIMOPTIC) 0.5 % ophthalmic solution Place 1 drop into the left eye 2 (two) times daily.     SIGNIFICANT DIAGNOSTIC EXAMS   12-20-13: mri right foot: 1. No findings of osteomyelitis or abscess, but the soft tissues laterally along the stump appear very thin overlying the distal fourth and fifth metatarsal amputation margins dorsally, possibly from ulceration or soft tissue breakdown.  12-26-13: chest x-ray: Cardiomegaly. Mild interstitial prominence bilaterally suspicious for mild interstitial edema. No segmental infiltrate  12-26-13: ct of abdomen and pelvis: 1. No acute abnormality. 2. Bilateral vascular renal calcifications and at least 1 nonobstructing right renal calculus.3. Small bilateral pleural effusions.4. Mild right lower lobe cylindrical bronchiectasis.5. Mild bibasilar atelectasis. 6. Dense coronary artery atheromatous calcifications.  12-27-13: 2-d echo: Mild LVH with LVEF 50-55%, inferolateral hypokinesis, grade 1 diastolic dysfunction with increased filling pressures. Mild left atrial enlargement. MAC with moderate mitral regurgitation. Mild tricuspid regurgitation with PASP 49 mmHg.  12-28-13: ct of head: No acute intracranial abnormality. Stable hydrocephalus.  Minimal change since 12/18/2013.  12-29-13: chest x-ray: Bronchitic changes with posterior density at the lung  bases on lateral view question pleural effusions, cannot exclude RIGHT lower lobe consolidation.  12-29-13: renal ultrasound: No hydronephrosis or evidence of nephrolithiasis.  01-01-14: chest x-ray: Low lung volumes with increase in bibasilar atelectasis. Mild interstitial edema and stable cardiomegaly.  02-26-14: chest x-ray: mild congestion   05-20-14: chest x-ray: increased interstitial changes with consideration for pneumonia or interstitial edema/chf  05-20-14: chest x-ray (at [place]): Focal opacification over the left mid upper lung which may be due to a pneumonia. Additional findings suggesting mild vascular congestion with stable cardiomegaly.  05-20-14: ct of head: 1. No evidence of acute intracranial abnormality. 2. Unchanged hydrocephalus. 3. Paranasal sinus mucosal thickening and fluid. Correlate clinically for acute sinusitis.     LABS REVIEWED:   12-18-13: hgb a1c 6.2; tsh 0.858 12-27-13: wbc 9.5; hgb 10.1; hct 32.0; mcv 90.1; plt 275; glucose 169; bun 22; creat 0.97; k+3.9; na++142; liver normal albumin 2.3; BNP 4645 12-28-13: ammonia 26 12-30-13: glucose 120; bun 24; creat 2.1; k+ 4.0; na++142 01-05-14: glucose 79; bun  28; creat 2.46; k+4.2; na++ 140  01-11-14: glucose 147; bun 21.6; creat 1.52; k+4.8; na++140  03-20-14: wbc 7.4; hgb 8.9; hct 32.1; mcv 94.4; plt 261; glucose 187; bun 19.6; creat 2.05; k+3.6; na++142 04-06-14: wbc 4.1; hgb 9.6; hct 30.7; mcv 89.7 plt 212; glucose 91; bun 15; creat 1.26; k+3.9;  na++136 liver normal albumin 2.6  05-20-14; wbc 16.7; hgb 7.5; hct 23.5;mcv 86.6; plt 251; glucose 141; bun 36; creat 1.83; k+ 4.1; na++137; ast 71; albumin 2.0 05-20-14: wbc 16.4; hgb 7.5; hct 24.5; mcv 89.1; plt 243; glucose 169; bun 38; creat 2.31; k+3.9; na++137; ast 111; albumin 1.8; urine culture: proteus mirabilis: ceftin 05-22-14: wbc 24.0; hgb 9.3; hct 29.2; mcv 87.;4 plt 226; glucose 138; bun 43; creat 2.06; k+3.0; na++138; ca++6.9 05-25-14: wbc 15.6; hgb 8.7;  hct 28.8; mcv 90.6 ;plt 323; glucose 96; bun 39; creat 1.71; k+5.3; na++139; ca++7.9; guaiac +      ROS Unable to perform ROS      Physical Exam Constitutional: No distress.  Obese   Neck: Neck supple. No JVD present. No thyromegaly present.  Cardiovascular: Normal rate and regular rhythm.   Pedal pulses present status post right metatarsal amputation   Respiratory: Effort normal and breath sounds normal. No respiratory distress.  GI: Soft. Bowel sounds are normal. She exhibits no distension.  Genitourinary:  Has foley   Musculoskeletal: She exhibits no edema.  Is able to move all extremities   Neurological: She is alert.  Skin: Skin is warm and dry. She is not diaphoretic.      ASSESSMENT/ PLAN:  1. UTI/acute pyelonephritis: will complete her ceftin 500 mg twice daily and will monitor her status.   2. Oral thrush; will complete diflucan will monitor  3. Diabetes: she is presently not on medications; her hgb a1c is 6.2 will monitor  4. Chronic diastolic heart failure: is not on diuretic; is 02 dependent will continue coreg 3.125 mg twice daily will monitor   5. 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 Chronic pain: she is presently not complaining of pain will continue tylenol 650 mg three times daily will continue roxanol 10 mg every 2 hours as needed will monitor   7. Gerd: will continue reglan 5 mg four times daily   8. Hypertension: will continue coreg 3.125 mg twice daily   9. Depression: is without change was started on zoloft solution 1.3 cc while in the hospital  10.  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 Constipation: will continue colace twice daily and miralax daily   12. Urine retention: has long term foley   Will check cbc; cmp hgb a1c lipids next week   Time spent with patient 50 minutes.      Ok Edwards NP [facility]  Contact  [phone] Monday through Friday 8am- 5pm  After hours call [phone]

## 2014-05-27 ENCOUNTER — Non-Acute Institutional Stay (SKILLED_NURSING_FACILITY): Payer: Medicare Other | Admitting: Internal Medicine

## 2014-05-27 ENCOUNTER — Encounter: Payer: Self-pay | Admitting: Internal Medicine

## 2014-05-27 DIAGNOSIS — E1159 Type 2 diabetes mellitus with other circulatory complications: Secondary | ICD-10-CM

## 2014-05-27 DIAGNOSIS — R627 Adult failure to thrive: Secondary | ICD-10-CM | POA: Diagnosis not present

## 2014-05-27 DIAGNOSIS — N184 Chronic kidney disease, stage 4 (severe): Secondary | ICD-10-CM | POA: Diagnosis not present

## 2014-05-27 DIAGNOSIS — M150 Primary generalized (osteo)arthritis: Secondary | ICD-10-CM | POA: Diagnosis not present

## 2014-05-27 DIAGNOSIS — G894 Chronic pain syndrome: Secondary | ICD-10-CM | POA: Diagnosis not present

## 2014-05-27 DIAGNOSIS — N390 Urinary tract infection, site not specified: Secondary | ICD-10-CM

## 2014-05-27 DIAGNOSIS — F329 Major depressive disorder, single episode, unspecified: Secondary | ICD-10-CM

## 2014-05-27 DIAGNOSIS — E1151 Type 2 diabetes mellitus with diabetic peripheral angiopathy without gangrene: Secondary | ICD-10-CM

## 2014-05-27 DIAGNOSIS — M159 Polyosteoarthritis, unspecified: Secondary | ICD-10-CM

## 2014-05-27 DIAGNOSIS — F322 Major depressive disorder, single episode, severe without psychotic features: Secondary | ICD-10-CM

## 2014-05-27 LAB — CULTURE, BLOOD (ROUTINE X 2)
CULTURE: NO GROWTH
Culture: NO GROWTH

## 2014-05-27 NOTE — Progress Notes (Signed)
Patient ID: [name], female   DOB: 03-Aug-1940, 74 y.o.   MRN: [id number]   05/27/14  Location:  [facility]    Place of Service: SNF [phone])   Extended Emergency Contact Information Primary Emergency Contact: Patric Dykes [place] Phone: [phone] Work Phone: [phone] Relation: Legal Guardian Secondary Emergency Contact: Farrel Demark [place] Phone: [phone] Relation: Daughter  Advanced Directive information  FULL CODE  Chief Complaint  Patient presents with  . Readmit To SNF    HPI:  74 yo female long term resident seen today for readmission into SNF following hospital stay for dehydration, pneumonia and UTI.  Her daughter and youngest sister are present. Hospital records reviewed. She was initially tx with rocephin and transitioned to po ceftin. Strep pneumonia and legionella antigen neg. She was tx for oral thrush with MMW. Rx prn roxanol for pain. Palliative care consulted to address code status and goals of care. She remained full code. She rec'd 1 unit PRBCs for anemia. D/c Hgb 8.7. CT head neg for acute process but did show possible sinusitis  She has no c/o today. No CP, SOB, f/c. No nursing issues. She is a poor historian due to depression. Hx obtained from chart.  BP controlled on coreg  Mood stable on remeron and Sertraline  BS well controlled and she does not receive accu checks.  She is blind and has glaucoma. She uses eye gtts.    Past Medical History  Diagnosis Date  . Hypertension   . Diabetes mellitus   . Blindness   . TIA (transient ischemic attack)   . Anemia   . GERD (gastroesophageal reflux disease)   . Constipation   . Renal disorder 05/2012    acute Renal Failure  . SYNCOPE 05/19/2009    Annotation: multiple over past 1 yr  Qualifier: Diagnosis of  By: [name] MD, [name]    . CHOLECYSTECTOMY, HX OF 05/19/2009    Qualifier: Diagnosis of  By: [name] MD, [name]    .  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 Chronic osteomyelitis of toe of right foot 09/19/2013    3rd, 4th, 5th   . Acute pyelonephritis 05/25/2014  . Oral thrush 05/23/2014  . Chronic kidney disease (CKD), stage IV (severe) 05/21/2014  . HCAP (healthcare-associated pneumonia) 05/20/2014  . Chronic diastolic CHF (congestive heart failure) 03/02/2014    Past Surgical History  Procedure Laterality Date  . Gallbladder surgery    . Esophageal dilation    . Cholecystectomy    . Amputation  02/18/2012    Procedure: AMPUTATION RAY;  Surgeon: [name], MD;  Location: [facility];  Service: Orthopedics;  Laterality: Right;  right second toe  . Tee without cardioversion  02/21/2012    Procedure: TRANSESOPHAGEAL ECHOCARDIOGRAM (TEE);  Surgeon: [name], MD;  Location: [facility];  Service: Cardiovascular;  Laterality: N/A;   spoke with [name] from [place] pt will arrive by 815ish( thy change shifts at [place])  . Tee without cardioversion  02/25/2012    Procedure: TRANSESOPHAGEAL ECHOCARDIOGRAM (TEE);  Surgeon: [name], MD;  Location: [place];  Service: Cardiovascular;  Laterality: N/A;  . Amputation Right 09/07/2013    Procedure: AMPUTATION RAY;  Surgeon: [name], MD;  Location: [place];  Service: Orthopedics;  Laterality: Right;  Right Transmetatarsal Amputation    Patient Care Team: Gildardo Cranker, DO as PCP - General (Internal Medicine) Gerlene Fee, NP as Nurse Practitioner (Nurse Practitioner)  History  Social History  . Marital Status: Divorced    Spouse Name: N/A  . Number of Children: N/A  . Years of Education: N/A   Occupational History  . Not on file.   Social History Main Topics  . Smoking status: Former Smoker -- 0 years    Quit date: 06/25/1999  . Smokeless tobacco: Current User    Types: Snuff  . Alcohol Use: No  . Drug Use: No  . Sexual Activity: Not on file   Other Topics Concern  . Not on file   Social History Narrative     reports that she quit smoking  about 14 years ago. Her smokeless tobacco use includes Snuff. She reports that she does not drink alcohol or use illicit drugs.  Family History  Problem Relation Age of Onset  . Heart failure Mother    Family Status  Relation Status Death Age  . Mother Deceased   . Father Deceased     Immunization History  Administered Date(s) Administered  . Influenza Whole 11/10/2012  . Influenza-Unspecified 11/09/2013  . Pneumococcal-Unspecified 06/04/2012    No Known Allergies  Medications: Patient's Medications  New Prescriptions   No medications on file  Previous Medications   ACETAMINOPHEN (TYLENOL) 325 MG TABLET    Take 650 mg by mouth 3 (three) times daily.    AMINO ACIDS-PROTEIN HYDROLYS (PRO-STAT) LIQD    Take 30 mLs by mouth 2 (two) times daily.   ASPIRIN EC 325 MG TABLET    Take 325 mg by mouth daily.     BRINZOLAMIDE-BRIMONIDINE (SIMBRINZA) 1-0.2 % SUSP    Place 1 drop into the left eye 2 (two) times daily.   CALCIUM CARBONATE-VITAMIN D (TGT CALCIUM DIETARY SUPPLEMENT PO)    Take 2 tablets by mouth 3 (three) times daily.   CARVEDILOL (COREG) 3.125 MG TABLET    Take 3.125 mg by mouth 2 (two) times daily with a meal.   CEFUROXIME (CEFTIN) 500 MG TABLET    Take 1 tablet (500 mg total) by mouth 2 (two) times daily with a meal.   DOCUSATE SODIUM (COLACE) 100 MG CAPSULE    Take 100 mg by mouth 2 (two) times daily.   FLUCONAZOLE (DIFLUCAN) 100 MG TABLET    Take 1 tablet (100 mg total) by mouth daily.   HYDROCORTISONE (ANUSOL-HC) 2.5 % RECTAL CREAM    Place 1 application rectally 2 (two) times daily as needed for hemorrhoids.    IPRATROPIUM-ALBUTEROL (DUONEB) 0.5-2.5 (3) MG/3ML SOLN    Take 3 mLs by nebulization every 6 (six) hours as needed (wheezing).   METOCLOPRAMIDE (REGLAN) 5 MG TABLET    Take 1 tablet (5 mg total) by mouth 4 (four) times daily -  before meals and at bedtime.   MORPHINE SULFATE (MORPHINE CONCENTRATE) 10 MG/0.5ML SOLN CONCENTRATED SOLUTION    Take 0.5 mLs (10 mg total)  by mouth every 2 (two) hours as needed for severe pain.   MULTIPLE VITAMINS-MINERALS (DECUBI-VITE) CAPS    Take 2 capsules by mouth daily.   NON FORMULARY    Take 240 mLs by mouth 3 (three) times daily. 2-cal supplement 240 cc three times daily   OXYGEN    Inhale 2 L into the lungs continuous.   POLYETHYLENE GLYCOL (MIRALAX / GLYCOLAX) PACKET    Take 17 g by mouth daily.   PROMETHAZINE (PHENERGAN) 25 MG TABLET    Take 25 mg by mouth every 6 (six) hours as needed for nausea or vomiting.   SERTRALINE (ZOLOFT) 20  MG/ML CONCENTRATED SOLUTION    Take 1.3 mLs (26 mg total) by mouth daily.   SODIUM CHLORIDE (OCEAN) 0.65 % SOLN NASAL SPRAY    Place 1 spray into both nostrils every 6 (six) hours as needed for congestion.   TIMOLOL (TIMOPTIC) 0.5 % OPHTHALMIC SOLUTION    Place 1 drop into the left eye 2 (two) times daily.  Modified Medications   No medications on file  Discontinued Medications   No medications on file    Review of Systems  Unable to perform ROS: Psychiatric disorder    Filed Vitals:   05/27/14 1544  BP: 140/70  Pulse: 80  Temp: 98 F (36.7 C)  SpO2: 97%   There is no weight on file to calculate BMI.  Physical Exam  Constitutional: She appears well-developed. No distress.  Frail appearing lying in bed. Daughter and sister present  HENT:  Mouth/Throat: Oropharynx is clear and moist. No oropharyngeal exudate.  Eyes: No scleral icterus.  R>L corneal clouding  Neck: Neck supple. Carotid bruit is not present. No tracheal deviation present. No thyromegaly present.  Cardiovascular: Normal rate, regular rhythm and intact distal pulses.  Exam reveals no gallop and no friction rub.   Murmur (1/6 SEM) heard. No LE edema b/l. no calf TTP.   Pulmonary/Chest: Effort normal. No stridor. No respiratory distress. She has decreased breath sounds (right base). She has no wheezes. She has no rales.  Abdominal: Soft. Bowel sounds are normal. She exhibits no distension and no mass. There is  no hepatomegaly. There is no tenderness. There is no rebound and no guarding.  Genitourinary:  Foley cath intact and DTG yellow urine  Musculoskeletal: She exhibits edema.  Right TMA  Lymphadenopathy:    She has no cervical adenopathy.  Neurological: She is alert. She has normal reflexes.  Skin: Skin is warm and dry. No rash noted.  Psychiatric: She is withdrawn. She exhibits a depressed mood.     Labs reviewed: Admission on 05/20/2014, Discharged on 05/25/2014  No results displayed because visit has over 200 results.     CBC Latest Ref Rng 05/25/2014 05/23/2014 05/22/2014  WBC 4.0 - 10.5 K/uL 15.6(H) 16.8(H) 24.0(H)  Hemoglobin 12.0 - 15.0 g/dL 8.7(L) 9.9(L) 9.3(L)  Hematocrit 36.0 - 46.0 % 28.8(L) 32.1(L) 29.2(L)  Platelets 150 - 400 K/uL 323 172 226    CMP Latest Ref Rng 05/25/2014 05/23/2014 05/22/2014  Glucose 70 - 99 mg/dL 96 112(H) 138(H)  BUN 6 - 23 mg/dL 39(H) 41(H) 43(H)  Creatinine 0.50 - 1.10 mg/dL 1.71(H) 1.87(H) 2.06(H)  Sodium 135 - 145 mmol/L 139 137 138  Potassium 3.5 - 5.1 mmol/L 5.3(H) 4.9 3.0(L)  Chloride 96 - 112 mmol/L 114(H) 112 108  CO2 19 - 32 mmol/L 17(L) 18(L) 19  Calcium 8.4 - 10.5 mg/dL 7.9(L) 7.2(L) 6.9(L)  Total Protein 6.0 - 8.3 g/dL - - -  Total Bilirubin 0.3 - 1.2 mg/dL - - -  Alkaline Phos 39 - 117 U/L - - -  AST 0 - 37 U/L - - -  ALT 0 - 35 U/L - - -      Ct Head Wo Contrast  05/20/2014   CLINICAL DATA:  Fever, lethargy, and altered mental status. History of dementia.  EXAM: CT HEAD WITHOUT CONTRAST  TECHNIQUE: Contiguous axial images were obtained from the base of the skull through the vertex without intravenous contrast.  COMPARISON:  03/02/2014  FINDINGS: There is no evidence of acute cortical infarct, intracranial hemorrhage, mass, midline shift, or  extra-axial fluid collection. Moderate to severe, diffuse enlargement of the ventricles is unchanged. Chronic right frontal encephalomalacia is unchanged, as is patchy hypodensity in the right  greater than left thalami. Small, focal hypodensity in posterior left frontal lobe is also unchanged. Periventricular white matter hypodensities are unchanged and may reflect chronic small vessel ischemia and/or transependymal CSF flow.  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 There is a chronic, large left mastoid effusion. There is mild posterior ethmoid air cell mucosal thickening, left greater than right, with right greater than left sphenoid sinus fluid and mucosal thickening also present. Extensive carotid siphon calcification is noted.  IMPRESSION: 1. No evidence of acute intracranial abnormality. 2. Unchanged hydrocephalus. 3. Paranasal sinus mucosal thickening and fluid. Correlate clinically for acute sinusitis.   Electronically Signed   By: Logan Bores   On: 05/20/2014 21:38   Dg Chest Port 1 View  05/20/2014   CLINICAL DATA:  Fever, lethargy and altered mental status. Question of pneumonia on chest x-ray at nursing home.  EXAM: PORTABLE CHEST - 1 VIEW  COMPARISON:  03/05/2014 and 03/01/2014  FINDINGS: Patient is rotated to the left. Lungs are hypoinflated and demonstrate prominence of the perihilar markings bilaterally with focal airspace density over the left mid to upper lung. No evidence of effusion. Mild stable cardiomegaly. There is calcified plaque over the aortic arch. Remainder of the exam is unchanged.  IMPRESSION: Focal opacification over the left mid upper lung which may be due to a pneumonia. Additional findings suggesting mild vascular congestion with stable cardiomegaly.   Electronically Signed   By: Marin Olp M.D.   On: 05/20/2014 20:59     Assessment/Plan   ICD-9-CM ICD-10-CM   1. FTT (failure to thrive) in adult 783.7 R62.7   2. UTI (lower urinary tract infection) - resolved s/p abx tx 599.0 N39.0   3. Type 2 diabetes, controlled, with peripheral circulatory disorder s/p right TMA 250.70 E11.59    443.81    4. Chronic pain syndrome - stable  338.4 G89.4   5. Severe depression - stable 311 F32.9   6. Primary osteoarthritis involving multiple joints - pain controlled 715.09 M15.0   7. Chronic kidney disease (CKD), stage IV (severe) - stable 585.4 N18.4    --cont current meds  --PT/OT as indicated  --f/u with specialists as scheduled  --cont nutritional supplements  --will follow  [place]. Perlie Gold  [facility] [address] [place], [place] [address] [phone] Office (Wednesdays and Fridays 8 AM - 5 PM) [phone] Cell (Monday-Friday 8 AM - 5 PM)

## 2014-05-28 ENCOUNTER — Encounter: Payer: Self-pay | Admitting: *Deleted

## 2014-06-25 ENCOUNTER — Ambulatory Visit: Payer: Medicare Other | Admitting: Internal Medicine

## 2014-07-08 ENCOUNTER — Non-Acute Institutional Stay (SKILLED_NURSING_FACILITY): Payer: Medicare Other | Admitting: Adult Health

## 2014-07-08 DIAGNOSIS — K219 Gastro-esophageal reflux disease without esophagitis: Secondary | ICD-10-CM

## 2014-07-08 DIAGNOSIS — I639 Cerebral infarction, unspecified: Secondary | ICD-10-CM

## 2014-07-08 DIAGNOSIS — R627 Adult failure to thrive: Secondary | ICD-10-CM

## 2014-07-08 DIAGNOSIS — K5900 Constipation, unspecified: Secondary | ICD-10-CM

## 2014-07-08 DIAGNOSIS — E1159 Type 2 diabetes mellitus with other circulatory complications: Secondary | ICD-10-CM

## 2014-07-08 DIAGNOSIS — F329 Major depressive disorder, single episode, unspecified: Secondary | ICD-10-CM

## 2014-07-08 DIAGNOSIS — R634 Abnormal weight loss: Secondary | ICD-10-CM | POA: Diagnosis not present

## 2014-07-08 DIAGNOSIS — K5909 Other constipation: Secondary | ICD-10-CM

## 2014-07-08 DIAGNOSIS — E1151 Type 2 diabetes mellitus with diabetic peripheral angiopathy without gangrene: Secondary | ICD-10-CM

## 2014-07-08 DIAGNOSIS — I5032 Chronic diastolic (congestive) heart failure: Secondary | ICD-10-CM | POA: Diagnosis not present

## 2014-07-08 DIAGNOSIS — I10 Essential (primary) hypertension: Secondary | ICD-10-CM

## 2014-07-08 DIAGNOSIS — I6350 Cerebral infarction due to unspecified occlusion or stenosis of unspecified cerebral artery: Secondary | ICD-10-CM

## 2014-07-08 DIAGNOSIS — F322 Major depressive disorder, single episode, severe without psychotic features: Secondary | ICD-10-CM

## 2014-08-10 ENCOUNTER — Encounter: Payer: Self-pay | Admitting: Adult Health

## 2014-08-10 DIAGNOSIS — K5909 Other constipation: Secondary | ICD-10-CM | POA: Insufficient documentation

## 2014-08-10 NOTE — Progress Notes (Signed)
Patient ID: [name], female   DOB: 17-Feb-1940, 74 y.o.   MRN: [id number]  [facility]     No Known Allergies     Chief Complaint  Patient presents with  . Medical Management of Chronic Issues    HPI:  She is a long term resident of this facility being seen for the management of her chronic illnesses. Overall there is little change in her status. Her albumin is 1.6 and is on prostat twice daily; will need to increase this. She is unable to fully participate with the hpi or ros. There are no nursing concerns at this time.    Past Medical History  Diagnosis Date  . Hypertension   . Diabetes mellitus   . Blindness   . TIA (transient ischemic attack)   . Anemia   . GERD (gastroesophageal reflux disease)   . Constipation   . Renal disorder 05/2012    acute Renal Failure  . SYNCOPE 05/19/2009    Annotation: multiple over past 1 yr  Qualifier: Diagnosis of  By: [name] MD, [name]    . CHOLECYSTECTOMY, HX OF 05/19/2009    Qualifier: Diagnosis of  By: [name] MD, [name]    . 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 Chronic osteomyelitis of toe of right foot 09/19/2013    3rd, 4th, 5th   . Acute pyelonephritis 05/25/2014  . Oral thrush 05/23/2014  . Chronic kidney disease (CKD), stage IV (severe) 05/21/2014  . HCAP (healthcare-associated pneumonia) 05/20/2014  . Chronic diastolic CHF (congestive heart failure) 03/02/2014    Past Surgical History  Procedure Laterality Date  . Gallbladder surgery    . Esophageal dilation    . Cholecystectomy    . Amputation  02/18/2012    Procedure: AMPUTATION RAY;  Surgeon: [name], MD;  Location: [facility];  Service: Orthopedics;  Laterality: Right;  right second toe  . Tee without cardioversion  02/21/2012    Procedure: TRANSESOPHAGEAL ECHOCARDIOGRAM (TEE);  Surgeon: [name], MD;  Location: [facility] ENDOSCOPY;  Service: Cardiovascular;  Laterality: N/A;   spoke with [name] from [place] pt will arrive by 815ish( thy change shifts at [place])  . Tee without  cardioversion  02/25/2012    Procedure: TRANSESOPHAGEAL ECHOCARDIOGRAM (TEE);  Surgeon: [name], MD;  Location: [place];  Service: Cardiovascular;  Laterality: N/A;  . Amputation Right 09/07/2013    Procedure: AMPUTATION RAY;  Surgeon: [name], MD;  Location: [place];  Service: Orthopedics;  Laterality: Right;  Right Transmetatarsal Amputation    VITAL SIGNS BP 124/66 mmHg  Pulse 70  Ht 4\' 10"  (1.473 m)  Wt 183 lb (83.008 kg)  BMI 38.26 kg/m2  SpO2 97%   Outpatient Encounter Prescriptions as of 07/08/2014  Medication Sig  . acetaminophen (TYLENOL) 325 MG tablet Take 650 mg by mouth 3 (three) times daily.   . Amino Acids-Protein Hydrolys (PRO-STAT) LIQD Take 30 mLs by mouth 2 (two) times daily.  Marland Kitchen aspirin EC 325 MG tablet Take 325 mg by mouth daily. For heart failure  . Brinzolamide-Brimonidine (SIMBRINZA) 1-0.2 % SUSP Place 1 drop into the left eye 2 (two) times daily.  . Calcium Carbonate-Vitamin D (TGT CALCIUM DIETARY SUPPLEMENT PO) Take 2 tablets by mouth 3 (three) times daily.  . carvedilol (COREG) 3.125 MG tablet Take 3.125 mg by mouth 2 (two) times daily with a meal.  . docusate sodium (COLACE) 100 MG capsule Take 100 mg by mouth 2 (two) times daily.  . hydrocortisone (ANUSOL-HC) 2.5 % rectal cream Place  1 application rectally 2 (two) times daily as needed for hemorrhoids.   Marland Kitchen ipratropium-albuterol (DUONEB) 0.5-2.5 (3) MG/3ML SOLN Take 3 mLs by nebulization every 6 (six) hours as needed (wheezing).  . metoCLOPramide (REGLAN) 5 MG tablet Take 1 tablet (5 mg total) by mouth 4 (four) times daily -  before meals and at bedtime.  . Morphine Sulfate (MORPHINE CONCENTRATE) 10 MG/0.5ML SOLN concentrated solution Take 0.5 mLs (10 mg total) by mouth every 2 (two) hours as needed for severe pain.  . Multiple Vitamins-Minerals (DECUBI-VITE) CAPS Take 2 capsules by mouth daily.  . NON FORMULARY Take 240 mLs by mouth 3 (three) times daily. 2-cal supplement 240 cc three times daily  .  OXYGEN Inhale 2 L into the lungs continuous.  . polyethylene glycol (MIRALAX / GLYCOLAX) packet Take 17 g by mouth daily.  . promethazine (PHENERGAN) 25 MG tablet Take 25 mg by mouth every 6 (six) hours as needed for nausea or vomiting.  . sertraline (ZOLOFT) 20 MG/ML concentrated solution Take 1.3 mLs (26 mg total) by mouth daily.  . sodium chloride (OCEAN) 0.65 % SOLN nasal spray Place 1 spray into both nostrils every 6 (six) hours as needed for congestion.  . timolol (TIMOPTIC) 0.5 % ophthalmic solution Place 1 drop into the left eye 2 (two) times daily.      SIGNIFICANT DIAGNOSTIC EXAMS   12-20-13: mri right foot: 1. No findings of osteomyelitis or abscess, but the soft tissues laterally along the stump appear very thin overlying the distal fourth and fifth metatarsal amputation margins dorsally, possibly from ulceration or soft tissue breakdown.  12-26-13: chest x-ray: Cardiomegaly. Mild interstitial prominence bilaterally suspicious for mild interstitial edema. No segmental infiltrate  12-26-13: ct of abdomen and pelvis: 1. No acute abnormality. 2. Bilateral vascular renal calcifications and at least 1 nonobstructing right renal calculus.3. Small bilateral pleural effusions.4. Mild right lower lobe cylindrical bronchiectasis.5. Mild bibasilar atelectasis. 6. Dense coronary artery atheromatous calcifications.  12-27-13: 2-d echo: Mild LVH with LVEF 50-55%, inferolateral hypokinesis, grade 1 diastolic dysfunction with increased filling pressures. Mild left atrial enlargement. MAC with moderate mitral regurgitation. Mild tricuspid regurgitation with PASP 49 mmHg.  12-28-13: ct of head: No acute intracranial abnormality. Stable hydrocephalus.  Minimal change since 12/18/2013.  12-29-13: chest x-ray: Bronchitic changes with posterior density at the lung bases on lateral view question pleural effusions, cannot exclude RIGHT lower lobe consolidation.  12-29-13: renal ultrasound: No  hydronephrosis or evidence of nephrolithiasis.  01-01-14: chest x-ray: Low lung volumes with increase in bibasilar atelectasis. Mild interstitial edema and stable cardiomegaly.  02-26-14: chest x-ray: mild congestion   05-20-14: chest x-ray: increased interstitial changes with consideration for pneumonia or interstitial edema/chf  05-20-14: chest x-ray (at [place]): Focal opacification over the left mid upper lung which may be due to a pneumonia. Additional findings suggesting mild vascular congestion with stable cardiomegaly.  05-20-14: ct of head: 1. No evidence of acute intracranial abnormality. 2. Unchanged hydrocephalus. 3. Paranasal sinus mucosal thickening and fluid. Correlate clinically for acute sinusitis.     LABS REVIEWED:   12-18-13: hgb a1c 6.2; tsh 0.858 12-27-13: wbc 9.5; hgb 10.1; hct 32.0; mcv 90.1; plt 275; glucose 169; bun 22; creat 0.97; k+3.9; na++142; liver normal albumin 2.3; BNP 4645 12-28-13: ammonia 26 12-30-13: glucose 120; bun 24; creat 2.1; k+ 4.0; na++142 01-05-14: glucose 79; bun 28; creat 2.46; k+4.2; na++ 140  01-11-14: glucose 147; bun 21.6; creat 1.52; k+4.8; na++140  03-20-14: wbc 7.4; hgb 8.9; hct 32.1; mcv 94.4;  plt 261; glucose 187; bun 19.6; creat 2.05; k+3.6; na++142 04-06-14: wbc 4.1; hgb 9.6; hct 30.7; mcv 89.7 plt 212; glucose 91; bun 15; creat 1.26; k+3.9;  na++136 liver normal albumin 2.6  05-20-14; wbc 16.7; hgb 7.5; hct 23.5;mcv 86.6; plt 251; glucose 141; bun 36; creat 1.83; k+ 4.1; na++137; ast 71; albumin 2.0 05-20-14: wbc 16.4; hgb 7.5; hct 24.5; mcv 89.1; plt 243; glucose 169; bun 38; creat 2.31; k+3.9; na++137; ast 111; albumin 1.8; urine culture: proteus mirabilis: ceftin 05-22-14: wbc 24.0; hgb 9.3; hct 29.2; mcv 87.;4 plt 226; glucose 138; bun 43; creat 2.06; k+3.0; na++138; ca++6.9 05-25-14: wbc 15.6; hgb 8.7; hct 28.8; mcv 90.6 ;plt 323; glucose 96; bun 39; creat 1.71; k+5.3; na++139; ca++7.9; guaiac +  5--10-16: wbc 7.2; hgb 8.2; hct  26.1; mcv 89.5; plt 257; glucose 59; bun 22; creat 0.96; k+4.2; na++139; liver normal albumin 1.6; hgb a1c 5.0; chol 113;ldl 64; trig 72; hdl 29      Review of Systems  Unable to perform ROS: mental acuity     Physical Exam Constitutional: No distress.  Obese   Neck: Neck supple. No JVD present. No thyromegaly present.  Cardiovascular: Normal rate and regular rhythm.   Pedal pulses present status post right metatarsal amputation   Respiratory: Effort normal and breath sounds normal. No respiratory distress.  GI: Soft. Bowel sounds are normal. She exhibits no distension.  Genitourinary:  Has foley   Musculoskeletal: She exhibits no edema.  Is able to move all extremities   Neurological: She is alert.  Skin: Skin is warm and dry. She is not diaphoretic.       ASSESSMENT/ PLAN:  1. Diabetes: she is presently not on medications; her hgb a1c is 5.0 will monitor  2. Chronic diastolic heart failure: is not on diuretic; is 02 dependent will continue coreg 3.125 mg twice daily will monitor   3. 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 Chronic pain: she is presently not complaining of pain will continue tylenol 650 mg three times daily will continue roxanol 10 mg every 2 hours as needed will monitor   5. Gerd: will continue reglan 5 mg four times daily   6. Hypertension: will continue coreg 3.125 mg twice daily   7. Depression: is without change was started on zoloft solution 1.3 cc while in the hospital  8.  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 Constipation: will continue colace twice daily and miralax daily   10. Urine retention: has long term foley  11. FTT: her current weight is 183 pounds; and her albumin is 1.6 will increase prostat to 60 cc three times daily      Ok Edwards NP [facility]  Contact [phone] Monday through Friday 8am- 5pm  After  hours call [phone]

## 2014-08-12 ENCOUNTER — Non-Acute Institutional Stay (SKILLED_NURSING_FACILITY): Payer: Medicare Other | Admitting: Adult Health

## 2014-08-12 DIAGNOSIS — E1159 Type 2 diabetes mellitus with other circulatory complications: Secondary | ICD-10-CM | POA: Diagnosis not present

## 2014-08-12 DIAGNOSIS — H540 Blindness, both eyes: Secondary | ICD-10-CM | POA: Diagnosis not present

## 2014-08-12 DIAGNOSIS — K5900 Constipation, unspecified: Secondary | ICD-10-CM

## 2014-08-12 DIAGNOSIS — E1151 Type 2 diabetes mellitus with diabetic peripheral angiopathy without gangrene: Secondary | ICD-10-CM

## 2014-08-12 DIAGNOSIS — K5909 Other constipation: Secondary | ICD-10-CM

## 2014-08-12 DIAGNOSIS — F329 Major depressive disorder, single episode, unspecified: Secondary | ICD-10-CM

## 2014-08-12 DIAGNOSIS — H547 Unspecified visual loss: Secondary | ICD-10-CM

## 2014-08-12 DIAGNOSIS — R627 Adult failure to thrive: Secondary | ICD-10-CM

## 2014-08-12 DIAGNOSIS — I5032 Chronic diastolic (congestive) heart failure: Secondary | ICD-10-CM | POA: Diagnosis not present

## 2014-08-12 DIAGNOSIS — I10 Essential (primary) hypertension: Secondary | ICD-10-CM | POA: Diagnosis not present

## 2014-08-12 DIAGNOSIS — G894 Chronic pain syndrome: Secondary | ICD-10-CM | POA: Diagnosis not present

## 2014-08-12 DIAGNOSIS — K219 Gastro-esophageal reflux disease without esophagitis: Secondary | ICD-10-CM

## 2014-08-12 DIAGNOSIS — F322 Major depressive disorder, single episode, severe without psychotic features: Secondary | ICD-10-CM

## 2014-09-09 ENCOUNTER — Non-Acute Institutional Stay (SKILLED_NURSING_FACILITY): Payer: Medicare Other | Admitting: Internal Medicine

## 2014-09-09 DIAGNOSIS — R627 Adult failure to thrive: Secondary | ICD-10-CM

## 2014-09-09 DIAGNOSIS — N184 Chronic kidney disease, stage 4 (severe): Secondary | ICD-10-CM

## 2014-09-09 DIAGNOSIS — F322 Major depressive disorder, single episode, severe without psychotic features: Secondary | ICD-10-CM

## 2014-09-09 DIAGNOSIS — R609 Edema, unspecified: Secondary | ICD-10-CM | POA: Diagnosis not present

## 2014-09-09 DIAGNOSIS — M150 Primary generalized (osteo)arthritis: Secondary | ICD-10-CM | POA: Diagnosis not present

## 2014-09-09 DIAGNOSIS — I5032 Chronic diastolic (congestive) heart failure: Secondary | ICD-10-CM | POA: Diagnosis not present

## 2014-09-09 DIAGNOSIS — R339 Retention of urine, unspecified: Secondary | ICD-10-CM

## 2014-09-09 DIAGNOSIS — E1151 Type 2 diabetes mellitus with diabetic peripheral angiopathy without gangrene: Secondary | ICD-10-CM

## 2014-09-09 DIAGNOSIS — G894 Chronic pain syndrome: Secondary | ICD-10-CM | POA: Diagnosis not present

## 2014-09-09 DIAGNOSIS — M159 Polyosteoarthritis, unspecified: Secondary | ICD-10-CM

## 2014-09-09 DIAGNOSIS — G459 Transient cerebral ischemic attack, unspecified: Secondary | ICD-10-CM

## 2014-09-09 DIAGNOSIS — I10 Essential (primary) hypertension: Secondary | ICD-10-CM

## 2014-09-09 DIAGNOSIS — F329 Major depressive disorder, single episode, unspecified: Secondary | ICD-10-CM | POA: Diagnosis not present

## 2014-09-09 DIAGNOSIS — E1159 Type 2 diabetes mellitus with other circulatory complications: Secondary | ICD-10-CM

## 2014-09-09 LAB — CBC AND DIFFERENTIAL
HEMATOCRIT: 27 % — AB (ref 36–46)
Hemoglobin: 7.4 g/dL — AB (ref 12.0–16.0)
NEUTROS ABS: 12 /uL
Platelets: 428 10*3/uL — AB (ref 150–399)
WBC: 13.9 10^3/mL

## 2014-09-09 LAB — BASIC METABOLIC PANEL
BUN: 19 mg/dL (ref 4–21)
Creatinine: 1 mg/dL (ref 0.5–1.1)
Potassium: 5 mmol/L (ref 3.4–5.3)
Sodium: 140 mmol/L (ref 137–147)

## 2014-09-14 ENCOUNTER — Encounter: Payer: Self-pay | Admitting: Internal Medicine

## 2014-09-14 NOTE — Progress Notes (Signed)
Patient ID: [name], female   DOB: Jun 25, 1940, 74 y.o.   MRN: [id number]    DATE: 09/09/14  Location:  [facility]    Place of Service: SNF [phone])   Extended Emergency Contact Information Primary Emergency Contact: Patric Dykes [place] Phone: [phone] Work Phone: [phone] Relation: Legal Guardian Secondary Emergency Contact: [place] Phone: [phone] Relation: Daughter  Advanced Directive information  FULL CODE  Chief Complaint  Patient presents with  . Medical Management of Chronic Issues    HPI:  74 yo female long term resident seen today for f/u. She is moaning today and c/o "pain all over" and generally not feeling well. She has nausea. No other c/o. Appetite is poor and she receives nutritional supplements. She is blind. No nursing issues. No falls. Sleeps well  BP controlled on coreg. She takes an ASA daily for hx TIA.  CBGs not checked. Last A1c in Feb 2016 6.3%. DM is diet controlled  For pain, she takes roxanol prn.  GERD stable on regaln, promethazine. She takes colace BID and miralax daily for constipation  She takes vitamin supplements also.   She has severe depression and currently takes sertraline    Past Medical History  Diagnosis Date  . Hypertension   . Diabetes mellitus   . Blindness   . TIA (transient ischemic attack)   . Anemia   . GERD (gastroesophageal reflux disease)   . Constipation   . Renal disorder 05/2012    acute Renal Failure  . SYNCOPE 05/19/2009    Annotation: multiple over past 1 yr  Qualifier: Diagnosis of  By: [name] MD, [name]    . CHOLECYSTECTOMY, HX OF 05/19/2009    Qualifier: Diagnosis of  By: [name] MD, [name]    . 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 Chronic osteomyelitis of toe of right foot 09/19/2013    3rd, 4th, 5th   . Acute pyelonephritis 05/25/2014  . Oral thrush 05/23/2014  . Chronic kidney disease (CKD), stage IV (severe)  05/21/2014  . HCAP (healthcare-associated pneumonia) 05/20/2014  . Chronic diastolic CHF (congestive heart failure) 03/02/2014    Past Surgical History  Procedure Laterality Date  . Gallbladder surgery    . Esophageal dilation    . Cholecystectomy    . Amputation  02/18/2012    Procedure: AMPUTATION RAY;  Surgeon: [name], MD;  Location: [facility];  Service: Orthopedics;  Laterality: Right;  right second toe  . Tee without cardioversion  02/21/2012    Procedure: TRANSESOPHAGEAL ECHOCARDIOGRAM (TEE);  Surgeon: [name], MD;  Location: [facility] ENDOSCOPY;  Service: Cardiovascular;  Laterality: N/A;   spoke with [name] from [place] pt will arrive by 815ish( thy change shifts at [place])  . Tee without cardioversion  02/25/2012    Procedure: TRANSESOPHAGEAL ECHOCARDIOGRAM (TEE);  Surgeon: [name], MD;  Location: [place];  Service: Cardiovascular;  Laterality: N/A;  . Amputation Right 09/07/2013    Procedure: AMPUTATION RAY;  Surgeon: [name], MD;  Location: [place];  Service: Orthopedics;  Laterality: Right;  Right Transmetatarsal Amputation    Patient Care Team: Gildardo Cranker, DO as PCP - General (Internal Medicine) Gerlene Fee, NP as Nurse Practitioner (Nurse Practitioner)  Social History   Social History  . Marital Status: Divorced    Spouse Name: N/A  . Number of Children: N/A  . Years of Education: N/A   Occupational History  . Not on file.   Social  History Main Topics  . Smoking status: Former Smoker -- 0 years    Quit date: 06/25/1999  . Smokeless tobacco: Current User    Types: Snuff  . Alcohol Use: No  . Drug Use: No  . Sexual Activity: Not on file   Other Topics Concern  . Not on file   Social History Narrative     reports that she quit smoking about 15 years ago. Her smokeless tobacco use includes Snuff. She reports that she does not drink alcohol or use illicit drugs.  Immunization History  Administered Date(s) Administered  . Influenza Whole  11/10/2012  . Influenza-Unspecified 11/09/2013  . PPD Test 06/29/2011  . Pneumococcal-Unspecified 06/04/2012    No Known Allergies  Medications: Patient's Medications  New Prescriptions   No medications on file  Previous Medications   ACETAMINOPHEN (TYLENOL) 325 MG TABLET    Take 650 mg by mouth 3 (three) times daily.    AMINO ACIDS-PROTEIN HYDROLYS (PRO-STAT) LIQD    Take 30 mLs by mouth 2 (two) times daily.   ASPIRIN EC 325 MG TABLET    Take 325 mg by mouth daily. For heart failure   BRINZOLAMIDE-BRIMONIDINE (SIMBRINZA) 1-0.2 % SUSP    Place 1 drop into the left eye 2 (two) times daily.   CALCIUM CARBONATE-VITAMIN D (TGT CALCIUM DIETARY SUPPLEMENT PO)    Take 2 tablets by mouth 3 (three) times daily.   CARVEDILOL (COREG) 3.125 MG TABLET    Take 3.125 mg by mouth 2 (two) times daily with a meal.   DOCUSATE SODIUM (COLACE) 100 MG CAPSULE    Take 100 mg by mouth 2 (two) times daily.   HYDROCORTISONE (ANUSOL-HC) 2.5 % RECTAL CREAM    Place 1 application rectally 2 (two) times daily as needed for hemorrhoids.    IPRATROPIUM-ALBUTEROL (DUONEB) 0.5-2.5 (3) MG/3ML SOLN    Take 3 mLs by nebulization every 6 (six) hours as needed (wheezing).   METOCLOPRAMIDE (REGLAN) 5 MG TABLET    Take 1 tablet (5 mg total) by mouth 4 (four) times daily -  before meals and at bedtime.   MORPHINE SULFATE (MORPHINE CONCENTRATE) 10 MG/0.5ML SOLN CONCENTRATED SOLUTION    Take 0.5 mLs (10 mg total) by mouth every 2 (two) hours as needed for severe pain.   MULTIPLE VITAMINS-MINERALS (DECUBI-VITE) CAPS    Take 2 capsules by mouth daily.   NON FORMULARY    Take 240 mLs by mouth 3 (three) times daily. 2-cal supplement 240 cc three times daily   OXYGEN    Inhale 2 L into the lungs continuous.   POLYETHYLENE GLYCOL (MIRALAX / GLYCOLAX) PACKET    Take 17 g by mouth daily.   PROMETHAZINE (PHENERGAN) 25 MG TABLET    Take 25 mg by mouth every 6 (six) hours as needed for nausea or vomiting.   SERTRALINE (ZOLOFT) 20 MG/ML  CONCENTRATED SOLUTION    Take 1.3 mLs (26 mg total) by mouth daily.   SODIUM CHLORIDE (OCEAN) 0.65 % SOLN NASAL SPRAY    Place 1 spray into both nostrils every 6 (six) hours as needed for congestion.   TIMOLOL (TIMOPTIC) 0.5 % OPHTHALMIC SOLUTION    Place 1 drop into the left eye 2 (two) times daily.  Modified Medications   No medications on file  Discontinued Medications   No medications on file    Review of Systems  Unable to perform ROS: Psychiatric disorder    Filed Vitals:   09/09/14 1410  BP: 132/74  Pulse: 58  Temp: 98.3 F (36.8 C)  SpO2: 99%   There is no weight on file to calculate BMI.  Physical Exam  Constitutional: She appears well-developed.  Moaning, lying in bed, looks pale. No conversational dyspnea  HENT:  Mouth/Throat: Oropharynx is clear and moist. No oropharyngeal exudate.  Eyes: Right eye exhibits no discharge. Left eye exhibits no discharge. No scleral icterus.  She is blind  Neck: Neck supple. Carotid bruit is not present. No tracheal deviation present. No thyromegaly present.  Cardiovascular: Normal rate, regular rhythm and intact distal pulses.  Exam reveals no gallop and no friction rub.   Murmur (1/6 SEM) heard. +2 pitting UE edema b/l. no calf TTP. Trace LE edema.   Pulmonary/Chest: Effort normal and breath sounds normal. No stridor. No respiratory distress. She has no wheezes. She has no rales.  Abdominal: Soft. Bowel sounds are normal. She exhibits no distension and no mass. There is no hepatomegaly. There is tenderness (generalized). There is no rebound and no guarding.  Genitourinary:  Foley cath DTG bloody urine but no obvious clots seen  Musculoskeletal: She exhibits edema and tenderness.  Lymphadenopathy:    She has no cervical adenopathy.  Neurological: She is alert.  Skin: Skin is warm and dry. No rash noted.  Right foot prevlon boot intact  Psychiatric: Her behavior is normal. Her affect is blunt. Her speech is slurred.     Labs  reviewed: Nursing Home on 09/09/2014  Component Date Value Ref Range Status  . Hemoglobin 09/09/2014 7.4* 12.0 - 16.0 g/dL Final  . HCT 09/09/2014 27* 36 - 46 % Final  . Neutrophils Absolute 09/09/2014 12   Final  . Platelets 09/09/2014 428* 150 - 399 K/L Final  . WBC 09/09/2014 13.9   Final  . BUN 09/09/2014 19  4 - 21 mg/dL Final  . Creatinine 09/09/2014 1.0  0.5 - 1.1 mg/dL Final  . Potassium 09/09/2014 5.0  3.4 - 5.3 mmol/L Final  . Sodium 09/09/2014 140  137 - 147 mmol/L Final   Lab Results  Component Value Date   HGBA1C 6.3* 03/01/2014     STAT CXR:  Worsening CHF   Assessment/Plan   ICD-9-CM ICD-10-CM   1. Edema - worsening 782.3 R60.9   2. Chronic diastolic CHF (congestive heart failure) with exacerbation 428.32 I50.32    428.0    3. Urine retention 788.20 R33.9    s/p foley cath - bloody urine seen in bag  4. FTT (failure to thrive) in adult -stable 783.7 R62.7   5. Chronic kidney disease (CKD), stage IV (severe) - resolved 585.4 N18.4   6. Essential hypertension -stable 401.9 I10   7. Primary osteoarthritis involving multiple joints - stable 715.09 M15.0   8. Chronic pain syndrome - due to #7 338.4 G89.4   9. Severe depression - failing to change as expected 311 F32.9   10. Type 2 diabetes, controlled, with peripheral circulatory disorder - well controlled with diet 250.70 E11.59    443.81    11. Transient cerebral ischemia, unspecified transient cerebral ischemia type - stable 435.9 G45.9     --start Rocephin 1 gm IM daily x 10 as she is clinically ill with CBC s/o infection  --give Floraster BID x 2 weeks  --UA cx and s pending  --lasix 40mg  daily x 3. Repeat BMP on 8/15th  --cont other meds as ordered  --cont nutritional supplements as ordered  --cont foley cath care  --will follow  Aaliyah Cancro S. Eulas Post, D. O., F. A. C.  [place]  [facility] [place], [place] [address] [phone] Cell (Monday-Friday 8 AM  - 5 PM) [phone] After 5 PM and follow prompts

## 2014-10-12 ENCOUNTER — Encounter: Payer: Self-pay | Admitting: Adult Health

## 2014-10-12 NOTE — Progress Notes (Signed)
Patient ID: [name], female   DOB: August 09, 1940, 74 y.o.   MRN: [id number]   Facility: [facility]      No Known Allergies  Chief Complaint  Patient presents with  . Medical Management of Chronic Issues    HPI:  She is a long term resident of this facility being seen for the management of her chronic illnesses. Overall there is little change in her status. She is unable to fully participate in the hpi or ros. There are no nursing concerns at this time.    Past Medical History  Diagnosis Date  . Hypertension   . Diabetes mellitus   . Blindness   . TIA (transient ischemic attack)   . Anemia   . GERD (gastroesophageal reflux disease)   . Constipation   . Renal disorder 05/2012    acute Renal Failure  . SYNCOPE 05/19/2009    Annotation: multiple over past 1 yr  Qualifier: Diagnosis of  By: [name] MD, [name]    . CHOLECYSTECTOMY, HX OF 05/19/2009    Qualifier: Diagnosis of  By: [name] MD, [name]    . 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 Chronic osteomyelitis of toe of right foot 09/19/2013    3rd, 4th, 5th   . Acute pyelonephritis 05/25/2014  . Oral thrush 05/23/2014  . Chronic kidney disease (CKD), stage IV (severe) 05/21/2014  . HCAP (healthcare-associated pneumonia) 05/20/2014  . Chronic diastolic CHF (congestive heart failure) 03/02/2014    Past Surgical History  Procedure Laterality Date  . Gallbladder surgery    . Esophageal dilation    . Cholecystectomy    . Amputation  02/18/2012    Procedure: AMPUTATION RAY;  Surgeon: [name], MD;  Location: [facility];  Service: Orthopedics;  Laterality: Right;  right second toe  . Tee without cardioversion  02/21/2012    Procedure: TRANSESOPHAGEAL ECHOCARDIOGRAM (TEE);  Surgeon: [name], MD;  Location: [facility] ENDOSCOPY;  Service: Cardiovascular;  Laterality: N/A;   spoke with [name] from [place] pt will arrive by 815ish( thy change shifts at [facility])  . Tee without cardioversion  02/25/2012    Procedure: TRANSESOPHAGEAL  ECHOCARDIOGRAM (TEE);  Surgeon: [name], MD;  Location: [place];  Service: Cardiovascular;  Laterality: N/A;  . Amputation Right 09/07/2013    Procedure: AMPUTATION RAY;  Surgeon: [name], MD;  Location: [place];  Service: Orthopedics;  Laterality: Right;  Right Transmetatarsal Amputation    VITAL SIGNS BP 120/78 mmHg  Pulse 70  Ht 4\' 10"  (1.473 m)  Wt 177 lb (80.287 kg)  BMI 37.00 kg/m2  SpO2 98%  Patient's Medications  New Prescriptions   No medications on file  Previous Medications   ACETAMINOPHEN (TYLENOL) 325 MG TABLET    Take 650 mg by mouth 3 (three) times daily.    AMINO ACIDS-PROTEIN HYDROLYS (PRO-STAT) LIQD    Take 60 mLs by mouth 3 (three) times daily.    ASPIRIN EC 325 MG TABLET    Take 325 mg by mouth daily. For heart failure   BRINZOLAMIDE-BRIMONIDINE (SIMBRINZA) 1-0.2 % SUSP    Place 1 drop into the left eye 2 (two) times daily.   CALCIUM CARBONATE-VITAMIN D (TGT CALCIUM DIETARY SUPPLEMENT PO)    Take 2 tablets by mouth 3 (three) times daily.   CARVEDILOL (COREG) 3.125 MG TABLET    Take 3.125 mg by mouth 2 (two) times daily with a meal.   DOCUSATE SODIUM (COLACE) 100 MG CAPSULE    Take 100 mg by mouth  2 (two) times daily.   HYDROCORTISONE (ANUSOL-HC) 2.5 % RECTAL CREAM    Place 1 application rectally 2 (two) times daily as needed for hemorrhoids.    IPRATROPIUM-ALBUTEROL (DUONEB) 0.5-2.5 (3) MG/3ML SOLN    Take 3 mLs by nebulization every 6 (six) hours as needed (wheezing).   METOCLOPRAMIDE (REGLAN) 5 MG TABLET    Take 1 tablet (5 mg total) by mouth 4 (four) times daily -  before meals and at bedtime.   MORPHINE SULFATE (MORPHINE CONCENTRATE) 10 MG/0.5ML SOLN CONCENTRATED SOLUTION    Take 0.5 mLs (10 mg total) by mouth every 2 (two) hours as needed for severe pain.   MULTIPLE VITAMINS-MINERALS (DECUBI-VITE) CAPS    Take 2 capsules by mouth daily.   NON FORMULARY    Take 240 mLs by mouth 3 (three) times daily. 2-cal supplement 240 cc three times daily    OXYGEN    Inhale 2 L into the lungs continuous.   POLYETHYLENE GLYCOL (MIRALAX / GLYCOLAX) PACKET    Take 17 g by mouth daily.   PROMETHAZINE (PHENERGAN) 25 MG TABLET    Take 25 mg by mouth every 6 (six) hours as needed for nausea or vomiting.   SERTRALINE (ZOLOFT) 20 MG/ML CONCENTRATED SOLUTION    Take 1.3 mLs (26 mg total) by mouth daily.   SODIUM CHLORIDE (OCEAN) 0.65 % SOLN NASAL SPRAY    Place 1 spray into both nostrils every 6 (six) hours as needed for congestion.   TIMOLOL (TIMOPTIC) 0.5 % OPHTHALMIC SOLUTION    Place 1 drop into the left eye 2 (two) times daily.  Modified Medications   No medications on file  Discontinued Medications   No medications on file     SIGNIFICANT DIAGNOSTIC EXAMS   12-20-13: mri right foot: 1. No findings of osteomyelitis or abscess, but the soft tissues laterally along the stump appear very thin overlying the distal fourth and fifth metatarsal amputation margins dorsally, possibly from ulceration or soft tissue breakdown.  12-26-13: ct of abdomen and pelvis: 1. No acute abnormality. 2. Bilateral vascular renal calcifications and at least 1 nonobstructing right renal calculus.3. Small bilateral pleural effusions.4. Mild right lower lobe cylindrical bronchiectasis.5. Mild bibasilar atelectasis. 6. Dense coronary artery atheromatous calcifications.  12-27-13: 2-d echo: Mild LVH with LVEF 50-55%, inferolateral hypokinesis, grade 1 diastolic dysfunction with increased filling pressures. Mild left atrial enlargement. MAC with moderate mitral regurgitation. Mild tricuspid regurgitation with PASP 49 mmHg.  12-28-13: ct of head: No acute intracranial abnormality. Stable hydrocephalus.  Minimal change since 12/18/2013.  12-29-13: renal ultrasound: No hydronephrosis or evidence of nephrolithiasis.  02-26-14: chest x-ray: mild congestion   05-20-14: chest x-ray: increased interstitial changes with consideration for pneumonia or interstitial edema/chf  05-20-14:  chest x-ray (at [place]): Focal opacification over the left mid upper lung which may be due to a pneumonia. Additional findings suggesting mild vascular congestion with stable cardiomegaly.  05-20-14: ct of head: 1. No evidence of acute intracranial abnormality. 2. Unchanged hydrocephalus. 3. Paranasal sinus mucosal thickening and fluid. Correlate clinically for acute sinusitis.     LABS REVIEWED:   12-18-13: hgb a1c 6.2; tsh 0.858 12-27-13: wbc 9.5; hgb 10.1; hct 32.0; mcv 90.1; plt 275; glucose 169; bun 22; creat 0.97; k+3.9; na++142; liver normal albumin 2.3; BNP 4645 12-28-13: ammonia 26 12-30-13: glucose 120; bun 24; creat 2.1; k+ 4.0; na++142 01-05-14: glucose 79; bun 28; creat 2.46; k+4.2; na++ 140  01-11-14: glucose 147; bun 21.6; creat 1.52; k+4.8; na++140  03-20-14: wbc 7.4;  hgb 8.9; hct 32.1; mcv 94.4; plt 261; glucose 187; bun 19.6; creat 2.05; k+3.6; na++142 04-06-14: wbc 4.1; hgb 9.6; hct 30.7; mcv 89.7 plt 212; glucose 91; bun 15; creat 1.26; k+3.9;  na++136 liver normal albumin 2.6  05-20-14; wbc 16.7; hgb 7.5; hct 23.5;mcv 86.6; plt 251; glucose 141; bun 36; creat 1.83; k+ 4.1; na++137; ast 71; albumin 2.0 05-20-14: wbc 16.4; hgb 7.5; hct 24.5; mcv 89.1; plt 243; glucose 169; bun 38; creat 2.31; k+3.9; na++137; ast 111; albumin 1.8; urine culture: proteus mirabilis: ceftin 05-22-14: wbc 24.0; hgb 9.3; hct 29.2; mcv 87.;4 plt 226; glucose 138; bun 43; creat 2.06; k+3.0; na++138; ca++6.9 05-25-14: wbc 15.6; hgb 8.7; hct 28.8; mcv 90.6 ;plt 323; glucose 96; bun 39; creat 1.71; k+5.3; na++139; ca++7.9; guaiac +  06-08-14: wbc 7.2; hgb 8.2; hct 26.1; mcv 89.5; plt 257; glucose 59; bun 22; creat 0.96; k+4.2; na++139; liver normal albumin 1.6; hgb a1c 5.0; chol 113;ldl 64; trig 72; hdl 29      Review of Systems  Unable to perform ROS: mental acuity     Physical Exam Constitutional: No distress.  Obese   Neck: Neck supple. No JVD present. No thyromegaly present.    Cardiovascular: Normal rate and regular rhythm.   Pedal pulses present status post right metatarsal amputation   Respiratory: Effort normal and breath sounds normal. No respiratory distress.  GI: Soft. Bowel sounds are normal. She exhibits no distension.  Genitourinary:  Has foley   Musculoskeletal: She exhibits no edema.  Is able to move all extremities   Neurological: She is alert.  Skin: Skin is warm and dry. She is not diaphoretic.      Review of Systems Unable to perform ROS: mental acuity     Physical Exam Constitutional: No distress.  Obese   Neck: Neck supple. No JVD present. No thyromegaly present.  Cardiovascular: Normal rate and regular rhythm.   Pedal pulses present status post right metatarsal amputation   Respiratory: Effort normal and breath sounds normal. No respiratory distress.  GI: Soft. Bowel sounds are normal. She exhibits no distension.  Genitourinary:  Has foley   Musculoskeletal: She exhibits no edema.  Is able to move all extremities   Neurological: She is alert.  Skin: Skin is warm and dry. She is not diaphoretic.       ASSESSMENT/ PLAN:  1. Diabetes: she is presently not on medications; her hgb a1c is 5.0 will monitor  2. Chronic diastolic heart failure: is not on diuretic; is 02 dependent will continue coreg 3.125 mg twice daily will monitor   3. 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 Chronic pain: she is presently not complaining of pain will continue tylenol 650 mg three times daily will continue roxanol 10 mg every 2 hours as needed will monitor   5. Gerd: will continue reglan 5 mg four times daily   6. Hypertension: will continue coreg 3.125 mg twice daily   7. Depression: is without change will continue  zoloft solution 3 cc   8.  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 Constipation: will continue colace twice daily and miralax  daily   10. Urine retention: has long term foley  11. FTT: her current weight is 177 pounds; and her albumin is 1.6 will continue  prostat  60 cc three times daily      Ok Edwards NP [place]  [facility]  Contact [phone] Monday through Friday 8am- 5pm  After hours call [phone]

## 2014-10-19 ENCOUNTER — Non-Acute Institutional Stay (SKILLED_NURSING_FACILITY): Payer: Medicare Other | Admitting: Adult Health

## 2014-10-19 DIAGNOSIS — E1159 Type 2 diabetes mellitus with other circulatory complications: Secondary | ICD-10-CM

## 2014-10-19 DIAGNOSIS — R634 Abnormal weight loss: Secondary | ICD-10-CM | POA: Diagnosis not present

## 2014-10-19 DIAGNOSIS — K5909 Other constipation: Secondary | ICD-10-CM

## 2014-10-19 DIAGNOSIS — I10 Essential (primary) hypertension: Secondary | ICD-10-CM | POA: Diagnosis not present

## 2014-10-19 DIAGNOSIS — K5900 Constipation, unspecified: Secondary | ICD-10-CM

## 2014-10-19 DIAGNOSIS — N184 Chronic kidney disease, stage 4 (severe): Secondary | ICD-10-CM | POA: Diagnosis not present

## 2014-10-19 DIAGNOSIS — E1151 Type 2 diabetes mellitus with diabetic peripheral angiopathy without gangrene: Secondary | ICD-10-CM

## 2014-10-19 DIAGNOSIS — M170 Bilateral primary osteoarthritis of knee: Secondary | ICD-10-CM | POA: Diagnosis not present

## 2014-10-19 DIAGNOSIS — I5032 Chronic diastolic (congestive) heart failure: Secondary | ICD-10-CM | POA: Diagnosis not present

## 2014-11-10 ENCOUNTER — Inpatient Hospital Stay (HOSPITAL_COMMUNITY): Payer: Medicare Other

## 2014-11-10 ENCOUNTER — Non-Acute Institutional Stay (SKILLED_NURSING_FACILITY): Payer: Medicare Other | Admitting: Adult Health

## 2014-11-10 ENCOUNTER — Emergency Department (HOSPITAL_COMMUNITY): Payer: Medicare Other

## 2014-11-10 ENCOUNTER — Encounter (HOSPITAL_COMMUNITY): Payer: Self-pay

## 2014-11-10 ENCOUNTER — Inpatient Hospital Stay (HOSPITAL_COMMUNITY)
Admission: EM | Admit: 2014-11-10 | Discharge: 2014-11-30 | DRG: 871 | Disposition: E | Payer: Medicare Other | Attending: Emergency Medicine | Admitting: Emergency Medicine

## 2014-11-10 ENCOUNTER — Encounter: Payer: Self-pay | Admitting: Adult Health

## 2014-11-10 DIAGNOSIS — J189 Pneumonia, unspecified organism: Secondary | ICD-10-CM | POA: Diagnosis present

## 2014-11-10 DIAGNOSIS — H540 Blindness, both eyes: Secondary | ICD-10-CM | POA: Diagnosis present

## 2014-11-10 DIAGNOSIS — Z66 Do not resuscitate: Secondary | ICD-10-CM | POA: Diagnosis present

## 2014-11-10 DIAGNOSIS — E1159 Type 2 diabetes mellitus with other circulatory complications: Secondary | ICD-10-CM | POA: Diagnosis present

## 2014-11-10 DIAGNOSIS — Z7982 Long term (current) use of aspirin: Secondary | ICD-10-CM | POA: Diagnosis not present

## 2014-11-10 DIAGNOSIS — N184 Chronic kidney disease, stage 4 (severe): Secondary | ICD-10-CM | POA: Diagnosis present

## 2014-11-10 DIAGNOSIS — H547 Unspecified visual loss: Secondary | ICD-10-CM | POA: Diagnosis present

## 2014-11-10 DIAGNOSIS — F0390 Unspecified dementia without behavioral disturbance: Secondary | ICD-10-CM | POA: Diagnosis present

## 2014-11-10 DIAGNOSIS — J9611 Chronic respiratory failure with hypoxia: Secondary | ICD-10-CM | POA: Diagnosis present

## 2014-11-10 DIAGNOSIS — K219 Gastro-esophageal reflux disease without esophagitis: Secondary | ICD-10-CM | POA: Diagnosis present

## 2014-11-10 DIAGNOSIS — T83511A Infection and inflammatory reaction due to indwelling urethral catheter, initial encounter: Secondary | ICD-10-CM | POA: Diagnosis present

## 2014-11-10 DIAGNOSIS — Z22322 Carrier or suspected carrier of Methicillin resistant Staphylococcus aureus: Secondary | ICD-10-CM | POA: Diagnosis not present

## 2014-11-10 DIAGNOSIS — D631 Anemia in chronic kidney disease: Secondary | ICD-10-CM | POA: Diagnosis present

## 2014-11-10 DIAGNOSIS — L89322 Pressure ulcer of left buttock, stage 2: Secondary | ICD-10-CM | POA: Diagnosis present

## 2014-11-10 DIAGNOSIS — Z8249 Family history of ischemic heart disease and other diseases of the circulatory system: Secondary | ICD-10-CM

## 2014-11-10 DIAGNOSIS — R680 Hypothermia, not associated with low environmental temperature: Secondary | ICD-10-CM | POA: Diagnosis present

## 2014-11-10 DIAGNOSIS — J9811 Atelectasis: Secondary | ICD-10-CM | POA: Diagnosis present

## 2014-11-10 DIAGNOSIS — Z8673 Personal history of transient ischemic attack (TIA), and cerebral infarction without residual deficits: Secondary | ICD-10-CM

## 2014-11-10 DIAGNOSIS — E1122 Type 2 diabetes mellitus with diabetic chronic kidney disease: Secondary | ICD-10-CM | POA: Diagnosis present

## 2014-11-10 DIAGNOSIS — Y95 Nosocomial condition: Secondary | ICD-10-CM | POA: Diagnosis present

## 2014-11-10 DIAGNOSIS — N390 Urinary tract infection, site not specified: Secondary | ICD-10-CM | POA: Diagnosis present

## 2014-11-10 DIAGNOSIS — G9341 Metabolic encephalopathy: Secondary | ICD-10-CM | POA: Diagnosis present

## 2014-11-10 DIAGNOSIS — Z515 Encounter for palliative care: Secondary | ICD-10-CM | POA: Diagnosis present

## 2014-11-10 DIAGNOSIS — A419 Sepsis, unspecified organism: Secondary | ICD-10-CM | POA: Diagnosis present

## 2014-11-10 DIAGNOSIS — Z7401 Bed confinement status: Secondary | ICD-10-CM

## 2014-11-10 DIAGNOSIS — E872 Acidosis: Secondary | ICD-10-CM | POA: Diagnosis present

## 2014-11-10 DIAGNOSIS — R400 Somnolence: Secondary | ICD-10-CM | POA: Diagnosis not present

## 2014-11-10 DIAGNOSIS — Z79899 Other long term (current) drug therapy: Secondary | ICD-10-CM

## 2014-11-10 DIAGNOSIS — Z9049 Acquired absence of other specified parts of digestive tract: Secondary | ICD-10-CM

## 2014-11-10 DIAGNOSIS — R627 Adult failure to thrive: Secondary | ICD-10-CM | POA: Diagnosis present

## 2014-11-10 DIAGNOSIS — R6520 Severe sepsis without septic shock: Secondary | ICD-10-CM

## 2014-11-10 DIAGNOSIS — Z9981 Dependence on supplemental oxygen: Secondary | ICD-10-CM | POA: Diagnosis not present

## 2014-11-10 DIAGNOSIS — E11649 Type 2 diabetes mellitus with hypoglycemia without coma: Secondary | ICD-10-CM | POA: Diagnosis present

## 2014-11-10 DIAGNOSIS — I5032 Chronic diastolic (congestive) heart failure: Secondary | ICD-10-CM | POA: Diagnosis present

## 2014-11-10 DIAGNOSIS — E1139 Type 2 diabetes mellitus with other diabetic ophthalmic complication: Secondary | ICD-10-CM | POA: Diagnosis present

## 2014-11-10 DIAGNOSIS — R6521 Severe sepsis with septic shock: Secondary | ICD-10-CM | POA: Diagnosis present

## 2014-11-10 DIAGNOSIS — Z789 Other specified health status: Secondary | ICD-10-CM | POA: Diagnosis not present

## 2014-11-10 DIAGNOSIS — E876 Hypokalemia: Secondary | ICD-10-CM | POA: Diagnosis present

## 2014-11-10 DIAGNOSIS — R4182 Altered mental status, unspecified: Secondary | ICD-10-CM | POA: Diagnosis present

## 2014-11-10 DIAGNOSIS — I130 Hypertensive heart and chronic kidney disease with heart failure and stage 1 through stage 4 chronic kidney disease, or unspecified chronic kidney disease: Secondary | ICD-10-CM | POA: Diagnosis present

## 2014-11-10 DIAGNOSIS — Z87440 Personal history of urinary (tract) infections: Secondary | ICD-10-CM

## 2014-11-10 DIAGNOSIS — Z452 Encounter for adjustment and management of vascular access device: Secondary | ICD-10-CM

## 2014-11-10 DIAGNOSIS — N179 Acute kidney failure, unspecified: Secondary | ICD-10-CM | POA: Insufficient documentation

## 2014-11-10 DIAGNOSIS — J9690 Respiratory failure, unspecified, unspecified whether with hypoxia or hypercapnia: Secondary | ICD-10-CM

## 2014-11-10 DIAGNOSIS — E1151 Type 2 diabetes mellitus with diabetic peripheral angiopathy without gangrene: Secondary | ICD-10-CM | POA: Diagnosis present

## 2014-11-10 DIAGNOSIS — L8990 Pressure ulcer of unspecified site, unspecified stage: Secondary | ICD-10-CM | POA: Insufficient documentation

## 2014-11-10 LAB — COMPREHENSIVE METABOLIC PANEL
ALBUMIN: 1.2 g/dL — AB (ref 3.5–5.0)
ALT: 24 U/L (ref 14–54)
AST: 40 U/L (ref 15–41)
Alkaline Phosphatase: 230 U/L — ABNORMAL HIGH (ref 38–126)
Anion gap: 9 (ref 5–15)
BUN: 28 mg/dL — ABNORMAL HIGH (ref 6–20)
CHLORIDE: 106 mmol/L (ref 101–111)
CO2: 23 mmol/L (ref 22–32)
Calcium: 7.6 mg/dL — ABNORMAL LOW (ref 8.9–10.3)
Creatinine, Ser: 2.01 mg/dL — ABNORMAL HIGH (ref 0.44–1.00)
GFR calc Af Amer: 27 mL/min — ABNORMAL LOW (ref >60)
GFR calc non Af Amer: 24 mL/min — ABNORMAL LOW (ref >60)
GLUCOSE: 44 mg/dL — AB (ref 65–99)
Potassium: 3.7 mmol/L (ref 3.5–5.1)
Sodium: 138 mmol/L (ref 135–145)
Total Bilirubin: 0.8 mg/dL (ref 0.3–1.2)
Total Protein: 5.3 g/dL — ABNORMAL LOW (ref 6.5–8.1)

## 2014-11-10 LAB — URINE MICROSCOPIC-ADD ON

## 2014-11-10 LAB — CBC WITH DIFFERENTIAL/PLATELET
Basophils Absolute: 0 10*3/uL (ref 0.0–0.1)
Basophils Relative: 0 %
Eosinophils Absolute: 0 10*3/uL (ref 0.0–0.7)
Eosinophils Relative: 0 %
HCT: 22.1 % — ABNORMAL LOW (ref 36.0–46.0)
HEMOGLOBIN: 7.2 g/dL — AB (ref 12.0–15.0)
Lymphocytes Relative: 8 %
Lymphs Abs: 1.2 10*3/uL (ref 0.7–4.0)
MCH: 27.3 pg (ref 26.0–34.0)
MCHC: 32.6 g/dL (ref 30.0–36.0)
MCV: 83.7 fL (ref 78.0–100.0)
MONOS PCT: 4 %
Monocytes Absolute: 0.6 10*3/uL (ref 0.1–1.0)
NEUTROS ABS: 13.4 10*3/uL — AB (ref 1.7–7.7)
Neutrophils Relative %: 88 %
Platelets: 268 10*3/uL (ref 150–400)
RBC: 2.64 MIL/uL — ABNORMAL LOW (ref 3.87–5.11)
RDW: 21.4 % — ABNORMAL HIGH (ref 11.5–15.5)
WBC: 15.2 10*3/uL — ABNORMAL HIGH (ref 4.0–10.5)

## 2014-11-10 LAB — URINALYSIS, ROUTINE W REFLEX MICROSCOPIC
Bilirubin Urine: NEGATIVE
Glucose, UA: NEGATIVE mg/dL
Ketones, ur: NEGATIVE mg/dL
Nitrite: NEGATIVE
PROTEIN: NEGATIVE mg/dL
Specific Gravity, Urine: 1.007 (ref 1.005–1.030)
Urobilinogen, UA: 0.2 mg/dL (ref 0.0–1.0)
pH: 6 (ref 5.0–8.0)

## 2014-11-10 LAB — GLUCOSE, CAPILLARY
GLUCOSE-CAPILLARY: 115 mg/dL — AB (ref 65–99)
GLUCOSE-CAPILLARY: 107 mg/dL — AB (ref 65–99)

## 2014-11-10 LAB — PROTIME-INR
INR: 2.34 — ABNORMAL HIGH (ref 0.00–1.49)
PROTHROMBIN TIME: 25.4 s — AB (ref 11.6–15.2)

## 2014-11-10 LAB — PROCALCITONIN: PROCALCITONIN: 3.78 ng/mL

## 2014-11-10 LAB — I-STAT CG4 LACTIC ACID, ED: LACTIC ACID, VENOUS: 1.67 mmol/L (ref 0.5–2.0)

## 2014-11-10 LAB — PREPARE RBC (CROSSMATCH)

## 2014-11-10 LAB — MRSA PCR SCREENING: MRSA BY PCR: NEGATIVE

## 2014-11-10 LAB — CBG MONITORING, ED: GLUCOSE-CAPILLARY: 125 mg/dL — AB (ref 65–99)

## 2014-11-10 LAB — APTT: aPTT: 55 seconds — ABNORMAL HIGH (ref 24–37)

## 2014-11-10 MED ORDER — ACETAMINOPHEN 325 MG PO TABS: 650.0000 mg | ORAL_TABLET | Freq: Four times a day (QID) | ORAL | Status: DC | PRN

## 2014-11-10 MED ORDER — ENOXAPARIN SODIUM 30 MG/0.3ML ~~LOC~~ SOLN
30.0000 mg | SUBCUTANEOUS | Status: DC
  Administered 2014-11-10 – 2014-11-11 (×2): 30 mg via SUBCUTANEOUS
  Filled 2014-11-10 (×2): qty 0.3

## 2014-11-10 MED ORDER — SODIUM CHLORIDE 0.9 % IV BOLUS (SEPSIS)
500.0000 mL | INTRAVENOUS | Status: AC
  Administered 2014-11-10: 500 mL via INTRAVENOUS

## 2014-11-10 MED ORDER — SODIUM CHLORIDE 0.9 % IV BOLUS (SEPSIS)
500.0000 mL | Freq: Once | INTRAVENOUS | Status: AC
  Administered 2014-11-10: 500 mL via INTRAVENOUS

## 2014-11-10 MED ORDER — TIMOLOL MALEATE 0.5 % OP SOLN
1.0000 [drp] | Freq: Two times a day (BID) | OPHTHALMIC | Status: DC
  Administered 2014-11-10 – 2014-11-11 (×3): 1 [drp] via OPHTHALMIC
  Filled 2014-11-10 (×2): qty 5

## 2014-11-10 MED ORDER — DEXTROSE 50 % IV SOLN
1.0000 | Freq: Once | INTRAVENOUS | Status: AC
  Administered 2014-11-10: 50 mL via INTRAVENOUS
  Filled 2014-11-10: qty 50

## 2014-11-10 MED ORDER — SODIUM CHLORIDE 0.9 % IV BOLUS (SEPSIS)
1000.0000 mL | INTRAVENOUS | Status: AC
  Administered 2014-11-10 (×2): 1000 mL via INTRAVENOUS

## 2014-11-10 MED ORDER — HYDROCORTISONE NA SUCCINATE PF 100 MG IJ SOLR
50.0000 mg | Freq: Four times a day (QID) | INTRAMUSCULAR | Status: DC
  Administered 2014-11-11 (×2): 50 mg via INTRAVENOUS
  Filled 2014-11-10 (×2): qty 2

## 2014-11-10 MED ORDER — SODIUM CHLORIDE 0.9 % IV BOLUS (SEPSIS): 500.0000 mL | Freq: Once | INTRAVENOUS | Status: AC

## 2014-11-10 MED ORDER — PIPERACILLIN-TAZOBACTAM 3.375 G IVPB 30 MIN
3.3750 g | Freq: Once | INTRAVENOUS | Status: AC
  Administered 2014-11-10: 3.375 g via INTRAVENOUS
  Filled 2014-11-10: qty 50

## 2014-11-10 MED ORDER — ONDANSETRON HCL 4 MG/2ML IJ SOLN: 4.0000 mg | Freq: Four times a day (QID) | INTRAMUSCULAR | Status: DC | PRN

## 2014-11-10 MED ORDER — PIPERACILLIN-TAZOBACTAM 3.375 G IVPB
3.3750 g | Freq: Three times a day (TID) | INTRAVENOUS | Status: DC
  Administered 2014-11-10 – 2014-11-11 (×4): 3.375 g via INTRAVENOUS
  Filled 2014-11-10 (×4): qty 50

## 2014-11-10 MED ORDER — SODIUM CHLORIDE 0.9 % IV BOLUS (SEPSIS): 500.0000 mL | INTRAVENOUS | Status: DC | PRN

## 2014-11-10 MED ORDER — VANCOMYCIN HCL IN DEXTROSE 1-5 GM/200ML-% IV SOLN
1000.0000 mg | Freq: Once | INTRAVENOUS | Status: AC
  Administered 2014-11-10: 1000 mg via INTRAVENOUS
  Filled 2014-11-10: qty 200

## 2014-11-10 MED ORDER — IPRATROPIUM-ALBUTEROL 0.5-2.5 (3) MG/3ML IN SOLN
3.0000 mL | Freq: Four times a day (QID) | RESPIRATORY_TRACT | Status: DC | PRN
Start: 2014-11-10 — End: 2014-11-12

## 2014-11-10 MED ORDER — VANCOMYCIN HCL IN DEXTROSE 1-5 GM/200ML-% IV SOLN: 1000.0000 mg | INTRAVENOUS | Status: DC

## 2014-11-10 MED ORDER — SODIUM CHLORIDE 0.9 % IV SOLN: Freq: Once | INTRAVENOUS | Status: DC

## 2014-11-10 MED ORDER — ALBUTEROL SULFATE (2.5 MG/3ML) 0.083% IN NEBU: 2.5000 mg | INHALATION_SOLUTION | RESPIRATORY_TRACT | Status: DC | PRN

## 2014-11-10 MED ORDER — DEXTROSE-NACL 5-0.9 % IV SOLN
INTRAVENOUS | Status: DC
  Administered 2014-11-10: 1000 mL via INTRAVENOUS
  Administered 2014-11-10 – 2014-11-11 (×3): via INTRAVENOUS

## 2014-11-10 MED ORDER — HYDROCORTISONE NA SUCCINATE PF 100 MG IJ SOLR
100.0000 mg | Freq: Three times a day (TID) | INTRAMUSCULAR | Status: DC
  Administered 2014-11-10: 100 mg via INTRAVENOUS
  Filled 2014-11-10: qty 2

## 2014-11-10 MED ORDER — ONDANSETRON HCL 4 MG PO TABS: 4.0000 mg | ORAL_TABLET | Freq: Four times a day (QID) | ORAL | Status: DC | PRN

## 2014-11-10 MED ORDER — ACETAMINOPHEN 650 MG RE SUPP: 650.0000 mg | Freq: Four times a day (QID) | RECTAL | Status: DC | PRN

## 2014-11-10 MED ORDER — SALINE SPRAY 0.65 % NA SOLN
1.0000 | Freq: Four times a day (QID) | NASAL | Status: DC | PRN
  Filled 2014-11-10: qty 44

## 2014-11-10 NOTE — ED Notes (Signed)
Attempted to call report, but receiving nurse receiving another patient at this time.

## 2014-11-10 NOTE — Progress Notes (Signed)
ANTIBIOTIC CONSULT NOTE - INITIAL  Pharmacy Consult for Vancomycin / Zosyn Indication: Sepsis  No Known Allergies  Patient Measurements:   Adjusted Body Weight:   Vital Signs: Temp Source: Oral (10/12 1342) BP: 102/47 mmHg (10/12 1500) Pulse Rate: 68 (10/12 1500) Intake/Output from previous day:   Intake/Output from this shift:    Labs:  Recent Labs  10/30/2014 1432  WBC 15.2*  HGB 7.2*  PLT 268   CrCl cannot be calculated (Patient has no serum creatinine result on file.). No results for input(s): VANCOTROUGH, VANCOPEAK, VANCORANDOM, GENTTROUGH, GENTPEAK, GENTRANDOM, TOBRATROUGH, TOBRAPEAK, TOBRARND, AMIKACINPEAK, AMIKACINTROU, AMIKACIN in the last 72 hours.   Microbiology: No results found for this or any previous visit (from the past 720 hour(s)).  Medical History: Past Medical History  Diagnosis Date  . Hypertension   . Diabetes mellitus   . Blindness   . TIA (transient ischemic attack)   . Anemia   . GERD (gastroesophageal reflux disease)   . Constipation   . Renal disorder 05/2012    acute Renal Failure  . SYNCOPE 05/19/2009    Annotation: multiple over past 1 yr  Qualifier: Diagnosis of  By: [name] MD, [name]    . CHOLECYSTECTOMY, HX OF 05/19/2009    Qualifier: Diagnosis of  By: [name] MD, [name]    . 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 Chronic osteomyelitis of toe of right foot ([place]) 09/19/2013    3rd, 4th, 5th   . Acute pyelonephritis 05/25/2014  . Oral thrush 05/23/2014  . Chronic kidney disease (CKD), stage IV (severe) ([place]) 05/21/2014  . HCAP (healthcare-associated pneumonia) 05/20/2014  . Chronic diastolic CHF (congestive heart failure) ([place]) 03/02/2014   Assessment: 11 yoF from NH with hx CKD-IV, DM, DM, TIA, and CHF, bed-bound at baseline presents with AMS and foul-smelling urine.  Pharmacy consulted to start Vancomycin and Zosyn for sepsis. First doses ordered in ED.   Anti-infectives 10/12 >> Vancomycin  >> 10/12 >> Zosyn  >>    Vitals/Labs WBC: Elevated,  15.2K Tm24h: None charted CKD-IV: SCr: 2.01 (baseline?), CrCl 21 CG (N28) Lactate: 1.67- WNL  Cultures 10/12 bloodx2: IP 10/12 urine: IP (U/A +bacteria, leukocytes)  Goal of Therapy:  Vancomycin trough level 15-20 mcg/ml  Eradication of infection  Plan:  Vancomycin 1g IV x1, then 1g IV q48h Zosyn 3.375g IV x1 (30 min infusion), then q8h (infuse over 4 hours) F/u renal function closely to adjust doses F/u cultures, labs, clinical course  Ralene Bathe, PharmD, BCPS 11/23/2014, 4:02 PM  Pager: [phone]

## 2014-11-10 NOTE — H&P (Signed)
PULMONARY / CRITICAL CARE MEDICINE   Name: [name] MRN: [id number] DOB: March 22, 1940    ADMISSION DATE:  11/23/2014 CONSULTATION DATE:  11/08/2014  REFERRING MD :  Tyrell Antonio  CHIEF COMPLAINT:  AMS  INITIAL PRESENTATION: 74 year old female presented to [facility] ED 10/12 from [facility] with altered mental status x 24 hours.She was admitted to the hospitalist team for presumed sepsis secondary to UTI vs PNA. Upon arrival to SDU she was hypotensive despite IVF resuscitation. PCCM to see.   STUDIES:  10/12 CXR > RLL opacification concerning for PNA vs ATX  SIGNIFICANT EVENTS: 10/12 admitted >   HISTORY OF PRESENT ILLNESS:  74 year old female with PMH as below, which includes blindness, HTN, DM, CVA, stage IV CKD, chronic osteomyelitis, diastolic CHF, and failure to thrive. She was most recently admitted 04/2014 with proteus UTI treated with rocephin and discharged to SNF. Palliative care consult was recommended at discharge. Her baseline level of function appears to be pretty poor, based on SNF MD notes. Most recently 10/19/2014 MD reports that she has had significant weight loss and is unable to engage with those around her. She was unable to participate in HPI or ROS during that encounter.  Also describes as O2 dependent for diastolic CHF. She has chronic foley as well.   10/12 she presented [facility] ED from SNF. Staff there voices concern of altered mental status. Staff stated that she is typically verbal, but today 10/12 was nonverbal. Urine was noted to be malodorous in ED. WBC was elevated at 15. UA demonstrated acute infection. CXR could also not rule out a pneumonia as there was some RLL consolidation noted. She was admitted to the hospitalist team and treated with  IV antibiotics. Upon arrival to ICU she was hypotensive. This did not resolve with repeat IVF boluses. PCCM consulted.   PAST MEDICAL HISTORY :   has a past medical history of Hypertension; Diabetes mellitus; Blindness; TIA  (transient ischemic attack); Anemia; GERD (gastroesophageal reflux disease); Constipation; Renal disorder (05/2012); SYNCOPE (05/19/2009); CHOLECYSTECTOMY, HX OF (05/19/2009); MRSA carrier (05/21/2014); Chronic osteomyelitis of toe of right foot ([place]) (09/19/2013); Acute pyelonephritis (05/25/2014); Oral thrush (05/23/2014); Chronic kidney disease (CKD), stage IV (severe) ([facility]) (05/21/2014); HCAP (healthcare-associated pneumonia) (05/20/2014); and Chronic diastolic CHF (congestive heart failure) ([place]) (03/02/2014).  has past surgical history that includes Gallbladder surgery; Esophageal dilation; Cholecystectomy; Amputation (02/18/2012); TEE without cardioversion (02/21/2012); TEE without cardioversion (02/25/2012); and Amputation (Right, 09/07/2013). Prior to Admission medications   Medication Sig Start Date End Date Taking? Authorizing Provider  acetaminophen (TYLENOL) 325 MG tablet Take 650 mg by mouth 3 (three) times daily.    Yes Historical Provider, MD  Amino Acids-Protein Hydrolys (PRO-STAT) LIQD Take 60 mLs by mouth 3 (three) times daily.    Yes Historical Provider, MD  aspirin EC 325 MG tablet Take 325 mg by mouth daily. For heart failure   Yes Historical Provider, MD  Brinzolamide-Brimonidine [facility]) 1-0.2 % SUSP Place 1 drop into the left eye 2 (two) times daily.   Yes Historical Provider, MD  calcium carbonate (OS-CAL) 1250 (500 CA) MG chewable tablet Chew 1 tablet by mouth 3 (three) times daily.   Yes Historical Provider, MD  carvedilol (COREG) 3.125 MG tablet Take 3.125 mg by mouth 2 (two) times daily with a meal.   Yes Historical Provider, MD  docusate sodium (COLACE) 100 MG capsule Take 100 mg by mouth 2 (two) times daily.   Yes Historical Provider, MD  hydrocortisone (ANUSOL-HC) 2.5 % rectal  cream Place 1 application rectally 2 (two) times daily as needed for hemorrhoids.    Yes Historical Provider, MD  ipratropium-albuterol (DUONEB) 0.5-2.5 (3) MG/3ML SOLN Take 3 mLs by nebulization every 6 (six)  hours as needed (wheezing).   Yes Historical Provider, MD  metoCLOPramide (REGLAN) 5 MG tablet Take 1 tablet (5 mg total) by mouth 4 (four) times daily -  before meals and at bedtime. 12/22/13  Yes [name], MD  Morphine Sulfate (MORPHINE CONCENTRATE) 10 MG/0.5ML SOLN concentrated solution Take 0.5 mLs (10 mg total) by mouth every 2 (two) hours as needed for severe pain. 05/25/14  Yes Venetia Maxon Rama, MD  Multiple Vitamins-Minerals (DECUBI-VITE) CAPS Take 2 capsules by mouth daily.   Yes Historical Provider, MD  NON FORMULARY Take 240 mLs by mouth 3 (three) times daily. 2-cal supplement 240 cc three times daily   Yes Historical Provider, MD  OXYGEN Inhale 2 L into the lungs continuous.   Yes Historical Provider, MD  polyethylene glycol (MIRALAX / GLYCOLAX) packet Take 17 g by mouth daily. 06/18/12  Yes Linton Flemings, MD  promethazine (PHENERGAN) 25 MG tablet Take 25 mg by mouth every 6 (six) hours as needed for nausea or vomiting.   Yes Historical Provider, MD  sertraline (ZOLOFT) 20 MG/ML concentrated solution Take 1.3 mLs (26 mg total) by mouth daily. Patient taking differently: Take 100 mg by mouth daily.  05/25/14  Yes Venetia Maxon Rama, MD  sodium chloride (OCEAN) 0.65 % SOLN nasal spray Place 1 spray into both nostrils every 6 (six) hours as needed for congestion.   Yes Historical Provider, MD  timolol (TIMOPTIC) 0.5 % ophthalmic solution Place 1 drop into the left eye 2 (two) times daily.   Yes Historical Provider, MD   No Known Allergies  FAMILY HISTORY:  indicated that her mother is deceased. She indicated that her father is deceased.  SOCIAL HISTORY:  reports that she quit smoking about 15 years ago. Her smokeless tobacco use includes Snuff. She reports that she does not drink alcohol or use illicit drugs.  REVIEW OF SYSTEMS:  unable  SUBJECTIVE:   VITAL SIGNS: Temp:  [92.7 F (33.7 C)-93.3 F (34.1 C)] 93.3 F (34.1 C) (10/12 1845) Pulse Rate:  [60-74] 72 (10/12  1855) Resp:  [14-20] 17 (10/12 1855) BP: (67-132)/(31-78) 68/33 mmHg (10/12 1855) SpO2:  [80 %-100 %] 100 % (10/12 1855) Weight:  [71.668 kg (158 lb)-72.9 kg (160 lb 11.5 oz)] 72.9 kg (160 lb 11.5 oz) (10/12 1845) HEMODYNAMICS:   VENTILATOR SETTINGS:   INTAKE / OUTPUT:  Intake/Output Summary (Last 24 hours) at 11/29/2014 2005 Last data filed at 11/03/2014 1715  Gross per 24 hour  Intake   2250 ml  Output      0 ml  Net   2250 ml    PHYSICAL EXAMINATION: General:  Overweight female in NAD Neuro:  Comatose. [place] 4 HEENT:  [place]/AT, unable to assess pupils Cardiovascular:  RRR, no MRG Lungs:  Clear bilateral breath sounds Abdomen:  Soft, non-distended Musculoskeletal:  R foot with all toes ambutated Skin: Dressed wound to R shin  LABS:  CBC  Recent Labs Lab 11/06/2014 1432  WBC 15.2*  HGB 7.2*  HCT 22.1*  PLT 268   Coag's No results for input(s): APTT, INR in the last 168 hours. BMET  Recent Labs Lab 11/14/2014 1432  NA 138  K 3.7  CL 106  CO2 23  BUN 28*  CREATININE 2.01*  GLUCOSE 44*   Electrolytes  Recent Labs Lab 11/26/2014 1432  CALCIUM 7.6*   Sepsis Markers  Recent Labs Lab 11/15/2014 1446  LATICACIDVEN 1.67   ABG No results for input(s): PHART, PCO2ART, PO2ART in the last 168 hours. Liver Enzymes  Recent Labs Lab 11/23/2014 1432  AST 40  ALT 24  ALKPHOS 230*  BILITOT 0.8  ALBUMIN 1.2*   Cardiac Enzymes No results for input(s): TROPONINI, PROBNP in the last 168 hours. Glucose  Recent Labs Lab 11/22/2014 1613 11/09/2014 1938  GLUCAP 125* 107*    Imaging Dg Chest Port 1 View  11/04/2014  CLINICAL DATA:  Sepsis. EXAM: PORTABLE CHEST 1 VIEW COMPARISON:  May 20, 2014. FINDINGS: Stable cardiomegaly. No pneumothorax or pleural effusion is noted. Right basilar airspace opacity is noted concerning for possible pneumonia or atelectasis. Minimal scarring or subsegmental atelectasis is noted laterally in left lung base. Bony thorax is intact.  IMPRESSION: New right basilar opacity is noted concerning for pneumonia or subsegmental atelectasis. Minimal left midlung subsegmental atelectasis or scarring is noted. Electronically Signed   By: Marijo Conception, M.D.   On: 11/25/2014 15:20     ASSESSMENT / PLAN:  PULMONARY A: Chronic hypoxemic respiratory failure  P:   O2 dependent baseline  CARDIOVASCULAR A:  Septic shock secondary to UTI/ PNA Chronic diastolic CHF H/o HTN  P:  Telemetry monitoring MAP goal > 15mm/Hg Aggressive IVF resuscitation  May need CVL pressors  RENAL A:   Acute on stage IV CKD Pseudohypocalcemia  P:   Hydrate Follow Bmet  GASTROINTESTINAL A:   Protein calorie malnutrition  P:   NPO Consider TPN vs tube feeds soon   HEMATOLOGIC A:  Anemia of chronic illness (baseline Hgb around 9)  P:  Follow CBC Transfuse for hgb/hct less than 7/21 Enoxaparin for VTE ppx.    INFECTIOUS A:   Septic shock secondary to UTI/HCAP Recent history of proteus UTI  P:   BCx2 10/12 >>> UC 10/12 >>> Abx: Zosyn, start date 10/12 >>> Abx: Vacno, start date 10/12 >>> Trend WBC and fever curves  ENDOCRINE A:   DM Hypoglycemic ? AI  P:   CBG monitoring. Add SSI if glucose consistently greater than 180 Cortisol Stress dose steroids  NEUROLOGIC A:  Acute on chronic metabolic encephalopathy H/o CVA  P:   RASS goal: 0  GLOBAL: Patient has had legal guardian in past. I was unable to contact her. Daughter claims that she is now legal guardian and is unaware of any current social services involvement. Daughter endorses full code and full aggressive measures, in leu of any legal guardian contact. Will respect this. Spoke with SNF MD on call and attempted to call SNF with no answer. Please attempt to clarify in AM.   FAMILY  - Updates: Spoke with daughter via telephone 10/12.   - Inter-disciplinary family meet or Palliative Care meeting due by:  10/19   Georgann Housekeeper, AGACNP-BC [place]  Pulmonology/Critical Care Pager [phone] or [phone]  10/30/2014 9:05 PM

## 2014-11-10 NOTE — Procedures (Signed)
Central Venous Catheter Insertion Procedure Note Scarle[facility] Brame [id number] Apr 19, 1940  Procedure: Insertion of Central Venous Catheter Indications: Assessment of intravascular volume, Drug and/or fluid administration and Frequent blood sampling  Procedure Details Consent: Risks of procedure as well as the alternatives and risks of each were explained to the (patient/caregiver).  Consent for procedure obtained. Time Out: Verified patient identification, verified procedure, site/side was marked, verified correct patient position, special equipment/implants available, medications/allergies/relevent history reviewed, required imaging and test results available.  Performed  Maximum sterile technique was used including antiseptics, cap, gloves, gown, hand hygiene, mask and sheet. Skin prep: Chlorhexidine; local anesthetic administered A antimicrobial bonded/coated triple lumen catheter was placed in the right internal jugular vein using the Seldinger technique. Ultrasound guidance used.Yes.   Catheter placed to 16 cm. Blood aspirated via all 3 ports and then flushed x 3. Line sutured x 2 and dressing applied.  Evaluation Blood flow good Complications: No apparent complications Patient did tolerate procedure well. Chest X-ray ordered to verify placement.  CXR: pending.  Georgann Housekeeper, AGACNP-BC [place] Pulmonology/Critical Care Pager [phone] or [phone]  11/24/2014 10:00 PM

## 2014-11-10 NOTE — ED Notes (Signed)
Assessed pt for blood draw, however i did not see anything to stick.

## 2014-11-10 NOTE — Progress Notes (Signed)
Patient ID: [name], female   DOB: 1940-09-15, 74 y.o.   MRN: [id number]    Facility: [facility]      No Known Allergies  Chief Complaint  Patient presents with  . Medical Management of Chronic Issues    HPI:  She is a long term resident of this facility being seen for the management of her chronic illnesses. She is not as engaging to those around her. She is slowly declining; she has lost weight from 180 pounds in June of this year to her current weight of 164 pounds.  She is unable to participate in the hpi or ros.     Past Medical History  Diagnosis Date  . Hypertension   . Diabetes mellitus   . Blindness   . TIA (transient ischemic attack)   . Anemia   . GERD (gastroesophageal reflux disease)   . Constipation   . Renal disorder 05/2012    acute Renal Failure  . SYNCOPE 05/19/2009    Annotation: multiple over past 1 yr  Qualifier: Diagnosis of  By: [name] MD, [name]    . CHOLECYSTECTOMY, HX OF 05/19/2009    Qualifier: Diagnosis of  By: [name] MD, [name]    . 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 Chronic osteomyelitis of toe of right foot ([place]) 09/19/2013    3rd, 4th, 5th   . Acute pyelonephritis 05/25/2014  . Oral thrush 05/23/2014  . Chronic kidney disease (CKD), stage IV (severe) ([place]) 05/21/2014  . HCAP (healthcare-associated pneumonia) 05/20/2014  . Chronic diastolic CHF (congestive heart failure) ([place]) 03/02/2014    Past Surgical History  Procedure Laterality Date  . Gallbladder surgery    . Esophageal dilation    . Cholecystectomy    . Amputation  02/18/2012    Procedure: AMPUTATION RAY;  Surgeon: [name], MD;  Location: [facility];  Service: Orthopedics;  Laterality: Right;  right second toe  . Tee without cardioversion  02/21/2012    Procedure: TRANSESOPHAGEAL ECHOCARDIOGRAM (TEE);  Surgeon: [name], MD;  Location: [facility] ENDOSCOPY;  Service: Cardiovascular;  Laterality: N/A;   spoke with [name] from [place] pt will arrive by 815ish( thy change shifts  at [place])  . Tee without cardioversion  02/25/2012    Procedure: TRANSESOPHAGEAL ECHOCARDIOGRAM (TEE);  Surgeon: [name], MD;  Location: [place];  Service: Cardiovascular;  Laterality: N/A;  . Amputation Right 09/07/2013    Procedure: AMPUTATION RAY;  Surgeon: [name], MD;  Location: [place];  Service: Orthopedics;  Laterality: Right;  Right Transmetatarsal Amputation    VITAL SIGNS BP 155/70 mmHg  Pulse 78  Ht 4\' 10"  (1.473 m)  Wt 164 lb (74.39 kg)  BMI 34.29 kg/m2  SpO2 98%  Patient's Medications  New Prescriptions   No medications on file  Previous Medications   ACETAMINOPHEN (TYLENOL) 325 MG TABLET    Take 650 mg by mouth 3 (three) times daily.    AMINO ACIDS-PROTEIN HYDROLYS (PRO-STAT) LIQD    Take 60 mLs by mouth 3 (three) times daily.    ASPIRIN EC 325 MG TABLET    Take 325 mg by mouth daily. For heart failure   BRINZOLAMIDE-BRIMONIDINE (SIMBRINZA) 1-0.2 % SUSP    Place 1 drop into the left eye 2 (two) times daily.   CALCIUM CARBONATE (OS-CAL) 1250 (500 CA) MG CHEWABLE TABLET    Chew 1 tablet by mouth 3 (three) times daily.   CARVEDILOL (COREG) 3.125 MG TABLET    Take 3.125 mg by mouth 2 (  two) times daily with a meal.   DOCUSATE SODIUM (COLACE) 100 MG CAPSULE    Take 100 mg by mouth 2 (two) times daily.   HYDROCORTISONE (ANUSOL-HC) 2.5 % RECTAL CREAM    Place 1 application rectally 2 (two) times daily as needed for hemorrhoids.    IPRATROPIUM-ALBUTEROL (DUONEB) 0.5-2.5 (3) MG/3ML SOLN    Take 3 mLs by nebulization every 6 (six) hours as needed (wheezing).   METOCLOPRAMIDE (REGLAN) 5 MG TABLET    Take 1 tablet (5 mg total) by mouth 4 (four) times daily -  before meals and at bedtime.   MORPHINE SULFATE (MORPHINE CONCENTRATE) 10 MG/0.5ML SOLN CONCENTRATED SOLUTION    Take 0.5 mLs (10 mg total) by mouth every 2 (two) hours as needed for severe pain.   MULTIPLE VITAMINS-MINERALS (DECUBI-VITE) CAPS    Take 2 capsules by mouth daily.   NON FORMULARY    Take 240 mLs by mouth  3 (three) times daily. 2-cal supplement 240 cc three times daily   OXYGEN    Inhale 2 L into the lungs continuous.   POLYETHYLENE GLYCOL (MIRALAX / GLYCOLAX) PACKET    Take 17 g by mouth daily.   PROMETHAZINE (PHENERGAN) 25 MG TABLET    Take 25 mg by mouth every 6 (six) hours as needed for nausea or vomiting.   SERTRALINE (ZOLOFT) 20 MG/ML CONCENTRATED SOLUTION    Take 1.3 mLs (26 mg total) by mouth daily.   SODIUM CHLORIDE (OCEAN) 0.65 % SOLN NASAL SPRAY    Place 1 spray into both nostrils every 6 (six) hours as needed for congestion.   TIMOLOL (TIMOPTIC) 0.5 % OPHTHALMIC SOLUTION    Place 1 drop into the left eye 2 (two) times daily.  Modified Medications   No medications on file  Discontinued Medications   No medications on file     SIGNIFICANT DIAGNOSTIC EXAMS   12-20-13: mri right foot: 1. No findings of osteomyelitis or abscess, but the soft tissues laterally along the stump appear very thin overlying the distal fourth and fifth metatarsal amputation margins dorsally, possibly from ulceration or soft tissue breakdown.  12-26-13: ct of abdomen and pelvis: 1. No acute abnormality. 2. Bilateral vascular renal calcifications and at least 1 nonobstructing right renal calculus.3. Small bilateral pleural effusions.4. Mild right lower lobe cylindrical bronchiectasis.5. Mild bibasilar atelectasis. 6. Dense coronary artery atheromatous calcifications.  12-27-13: 2-d echo: Mild LVH with LVEF 50-55%, inferolateral hypokinesis, grade 1 diastolic dysfunction with increased filling pressures. Mild left atrial enlargement. MAC with moderate mitral regurgitation. Mild tricuspid regurgitation with PASP 49 mmHg.  12-28-13: ct of head: No acute intracranial abnormality. Stable hydrocephalus.  Minimal change since 12/18/2013.  12-29-13: renal ultrasound: No hydronephrosis or evidence of nephrolithiasis.  02-26-14: chest x-ray: mild congestion   05-20-14: chest x-ray: increased interstitial changes with  consideration for pneumonia or interstitial edema/chf  05-20-14: chest x-ray (at [place]): Focal opacification over the left mid upper lung which may be due to a pneumonia. Additional findings suggesting mild vascular congestion with stable cardiomegaly.  05-20-14: ct of head: 1. No evidence of acute intracranial abnormality. 2. Unchanged hydrocephalus. 3. Paranasal sinus mucosal thickening and fluid. Correlate clinically for acute sinusitis.  09-09-14: chest x-ray: CHF     LABS REVIEWED:   12-18-13: hgb a1c 6.2; tsh 0.858 12-27-13: wbc 9.5; hgb 10.1; hct 32.0; mcv 90.1; plt 275; glucose 169; bun 22; creat 0.97; k+3.9; na++142; liver normal albumin 2.3; BNP 4645 12-28-13: ammonia 26 12-30-13: glucose 120; bun 24; creat 2.1;  k+ 4.0; na++142 01-05-14: glucose 79; bun 28; creat 2.46; k+4.2; na++ 140  01-11-14: glucose 147; bun 21.6; creat 1.52; k+4.8; na++140  03-20-14: wbc 7.4; hgb 8.9; hct 32.1; mcv 94.4; plt 261; glucose 187; bun 19.6; creat 2.05; k+3.6; na++142 04-06-14: wbc 4.1; hgb 9.6; hct 30.7; mcv 89.7 plt 212; glucose 91; bun 15; creat 1.26; k+3.9;  na++136 liver normal albumin 2.6  05-20-14; wbc 16.7; hgb 7.5; hct 23.5;mcv 86.6; plt 251; glucose 141; bun 36; creat 1.83; k+ 4.1; na++137; ast 71; albumin 2.0 05-20-14: wbc 16.4; hgb 7.5; hct 24.5; mcv 89.1; plt 243; glucose 169; bun 38; creat 2.31; k+3.9; na++137; ast 111; albumin 1.8; urine culture: proteus mirabilis: ceftin 05-22-14: wbc 24.0; hgb 9.3; hct 29.2; mcv 87.;4 plt 226; glucose 138; bun 43; creat 2.06; k+3.0; na++138; ca++6.9 05-25-14: wbc 15.6; hgb 8.7; hct 28.8; mcv 90.6 ;plt 323; glucose 96; bun 39; creat 1.71; k+5.3; na++139; ca++7.9; guaiac +  06-08-14: wbc 7.2; hgb 8.2; hct 26.1; mcv 89.5; plt 257; glucose 59; bun 22; creat 0.96; k+4.2; na++139; liver normal albumin 1.6; hgb a1c 5.0; chol 113;ldl 64; trig 72; hdl 29  09-13-14: wbc 13.9 hgb 7.4; hct 26.6; mcv 95.0; plt 428; glucose 68; bun 17.3; creat 0.96; k+ 4.8; na++136       Review of Systems  Unable to perform ROS: mental acuity     Physical Exam Constitutional: No distress.  Obese   Neck: Neck supple. No JVD present. No thyromegaly present.  Cardiovascular: Normal rate and regular rhythm.   Pedal pulses present status post right metatarsal amputation   Respiratory: Effort normal and breath sounds normal. No respiratory distress.  GI: Soft. Bowel sounds are normal. She exhibits no distension.  Genitourinary:  Has foley   Musculoskeletal: She exhibits no edema.  Is able to move all extremities   Neurological: She is alert.  Skin: Skin is warm and dry. She is not diaphoretic.     Review of Systems Unable to perform ROS: mental acuity      Physical Exam Constitutional: No distress.  Obese   Neck: Neck supple. No JVD present. No thyromegaly present.  Cardiovascular: Normal rate and regular rhythm.   Pedal pulses present status post right metatarsal amputation   Respiratory: Effort normal and breath sounds normal. No respiratory distress.  GI: Soft. Bowel sounds are normal. She exhibits no distension.  Genitourinary:  Has foley   Musculoskeletal: She exhibits no edema.  Does not move extremities on a voluntary basis    Neurological: She is alert.  Skin: Skin is warm and dry. She is not diaphoretic.       ASSESSMENT/ PLAN:  1. Diabetes: she is presently not on medications; her hgb a1c is 5.0 will monitor  2. Chronic diastolic heart failure: is not on diuretic; is 02 dependent will continue coreg 3.125 mg twice daily will monitor   3. 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 Chronic pain: she is presently not complaining of pain will continue tylenol 650 mg three times daily will continue roxanol 10 mg every 2 hours as needed will monitor   5. Gerd: will continue reglan 5 mg four times daily   6. Hypertension: will continue coreg 3.125 mg twice daily   7. Depression: is without change will  continue  zoloft solution 3 cc   8.  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 Constipation:  will continue colace twice daily and miralax daily   10. Urine retention: has long term foley  11. FTT: her current weight is 164 pounds; and her albumin is 1.6 will continue  prostat  60 cc three times daily  Will continue supplements per facility protocol     Ok Edwards NP [facility]  Contact [phone] Monday through Friday 8am- 5pm  After hours call [phone]

## 2014-11-10 NOTE — ED Notes (Signed)
Bed: XG52 Expected date:  Expected time:  Means of arrival:  Comments: EMS- 74yo F, AMS/septic?

## 2014-11-10 NOTE — ED Notes (Signed)
Patient's grandmother called and stated that the patient called her brother and talked to him. Phone in the room had been used, but patient continues to not talk to staff.

## 2014-11-10 NOTE — ED Notes (Signed)
Attempted to call report. Charge nurse to call back for report.

## 2014-11-10 NOTE — ED Notes (Signed)
Per EMS- Patient is a resident of [facility]. Staff noted yesterday a decrease in alertness yesterday. Today, staff states she is nonverbal, but usually is verbal. Patient is blind. Patient has a MOST form. Patient also has a foley with tea-colored urine that is foul smelling.

## 2014-11-10 NOTE — Progress Notes (Signed)
Patient ID: [name], female   DOB: 06-Feb-1940, 74 y.o.   MRN: [id number]    Facility: [facility]      No Known Allergies  Chief Complaint  Patient presents with  . Acute Visit    patient status     HPI:  Staff reports that she has not been eating or drinking or taking her medications for the past several days. She is not responsive to verbal stimuli at this time. She has little urine output and has foul vaginal drainage. There are no reports of fever present.   Past Medical History  Diagnosis Date  . Hypertension   . Diabetes mellitus   . Blindness   . TIA (transient ischemic attack)   . Anemia   . GERD (gastroesophageal reflux disease)   . Constipation   . Renal disorder 05/2012    acute Renal Failure  . SYNCOPE 05/19/2009    Annotation: multiple over past 1 yr  Qualifier: Diagnosis of  By: [name] MD, [name]    . CHOLECYSTECTOMY, HX OF 05/19/2009    Qualifier: Diagnosis of  By: [name] MD, [name]    . 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 Chronic osteomyelitis of toe of right foot ([place]) 09/19/2013    3rd, 4th, 5th   . Acute pyelonephritis 05/25/2014  . Oral thrush 05/23/2014  . Chronic kidney disease (CKD), stage IV (severe) ([place]) 05/21/2014  . HCAP (healthcare-associated pneumonia) 05/20/2014  . Chronic diastolic CHF (congestive heart failure) ([place]) 03/02/2014    Past Surgical History  Procedure Laterality Date  . Gallbladder surgery    . Esophageal dilation    . Cholecystectomy    . Amputation  02/18/2012    Procedure: AMPUTATION RAY;  Surgeon: [name], MD;  Location: [facility];  Service: Orthopedics;  Laterality: Right;  right second toe  . Tee without cardioversion  02/21/2012    Procedure: TRANSESOPHAGEAL ECHOCARDIOGRAM (TEE);  Surgeon: [name], MD;  Location: [facility] ENDOSCOPY;  Service: Cardiovascular;  Laterality: N/A;   spoke with [name] from [place] pt will arrive by 815ish( thy change shifts at [place])  . Tee without cardioversion  02/25/2012   Procedure: TRANSESOPHAGEAL ECHOCARDIOGRAM (TEE);  Surgeon: [name], MD;  Location: [place];  Service: Cardiovascular;  Laterality: N/A;  . Amputation Right 09/07/2013    Procedure: AMPUTATION RAY;  Surgeon: [name], MD;  Location: [place];  Service: Orthopedics;  Laterality: Right;  Right Transmetatarsal Amputation    VITAL SIGNS BP 132/78 mmHg  Pulse 60  Ht 4\' 10"  (1.473 m)  Wt 158 lb (71.668 kg)  BMI 33.03 kg/m2  SpO2 98%  Patient's Medications  New Prescriptions   No medications on file  Previous Medications   ACETAMINOPHEN (TYLENOL) 325 MG TABLET    Take 650 mg by mouth 3 (three) times daily.    AMINO ACIDS-PROTEIN HYDROLYS (PRO-STAT) LIQD    Take 60 mLs by mouth 3 (three) times daily.    ASPIRIN EC 325 MG TABLET    Take 325 mg by mouth daily. For heart failure   BRINZOLAMIDE-BRIMONIDINE (SIMBRINZA) 1-0.2 % SUSP    Place 1 drop into the left eye 2 (two) times daily.   CALCIUM CARBONATE (OS-CAL) 1250 (500 CA) MG CHEWABLE TABLET    Chew 1 tablet by mouth 3 (three) times daily.   CARVEDILOL (COREG) 3.125 MG TABLET    Take 3.125 mg by mouth 2 (two) times daily with a meal.   DOCUSATE SODIUM (COLACE) 100 MG CAPSULE  Take 100 mg by mouth 2 (two) times daily.   HYDROCORTISONE (ANUSOL-HC) 2.5 % RECTAL CREAM    Place 1 application rectally 2 (two) times daily as needed for hemorrhoids.    IPRATROPIUM-ALBUTEROL (DUONEB) 0.5-2.5 (3) MG/3ML SOLN    Take 3 mLs by nebulization every 6 (six) hours as needed (wheezing).   METOCLOPRAMIDE (REGLAN) 5 MG TABLET    Take 1 tablet (5 mg total) by mouth 4 (four) times daily -  before meals and at bedtime.   MORPHINE SULFATE (MORPHINE CONCENTRATE) 10 MG/0.5ML SOLN CONCENTRATED SOLUTION    Take 0.5 mLs (10 mg total) by mouth every 2 (two) hours as needed for severe pain.   MULTIPLE VITAMINS-MINERALS (DECUBI-VITE) CAPS    Take 2 capsules by mouth daily.   NON FORMULARY    Take 240 mLs by mouth 3 (three) times daily. 2-cal supplement 240 cc  three times daily   OXYGEN    Inhale 2 L into the lungs continuous.   POLYETHYLENE GLYCOL (MIRALAX / GLYCOLAX) PACKET    Take 17 g by mouth daily.   PROMETHAZINE (PHENERGAN) 25 MG TABLET    Take 25 mg by mouth every 6 (six) hours as needed for nausea or vomiting.   SERTRALINE (ZOLOFT) 20 MG/ML CONCENTRATED SOLUTION    Take 1.3 mLs (26 mg total) by mouth daily.   SODIUM CHLORIDE (OCEAN) 0.65 % SOLN NASAL SPRAY    Place 1 spray into both nostrils every 6 (six) hours as needed for congestion.   TIMOLOL (TIMOPTIC) 0.5 % OPHTHALMIC SOLUTION    Place 1 drop into the left eye 2 (two) times daily.  Modified Medications   No medications on file  Discontinued Medications   No medications on file     SIGNIFICANT DIAGNOSTIC EXAMS   12-20-13: mri right foot: 1. No findings of osteomyelitis or abscess, but the soft tissues laterally along the stump appear very thin overlying the distal fourth and fifth metatarsal amputation margins dorsally, possibly from ulceration or soft tissue breakdown.  12-26-13: ct of abdomen and pelvis: 1. No acute abnormality. 2. Bilateral vascular renal calcifications and at least 1 nonobstructing right renal calculus.3. Small bilateral pleural effusions.4. Mild right lower lobe cylindrical bronchiectasis.5. Mild bibasilar atelectasis. 6. Dense coronary artery atheromatous calcifications.  12-27-13: 2-d echo: Mild LVH with LVEF 50-55%, inferolateral hypokinesis, grade 1 diastolic dysfunction with increased filling pressures. Mild left atrial enlargement. MAC with moderate mitral regurgitation. Mild tricuspid regurgitation with PASP 49 mmHg.  12-28-13: ct of head: No acute intracranial abnormality. Stable hydrocephalus.  Minimal change since 12/18/2013.  12-29-13: renal ultrasound: No hydronephrosis or evidence of nephrolithiasis.  02-26-14: chest x-ray: mild congestion   05-20-14: chest x-ray: increased interstitial changes with consideration for pneumonia or interstitial  edema/chf  05-20-14: chest x-ray (at [place]): Focal opacification over the left mid upper lung which may be due to a pneumonia. Additional findings suggesting mild vascular congestion with stable cardiomegaly.  05-20-14: ct of head: 1. No evidence of acute intracranial abnormality. 2. Unchanged hydrocephalus. 3. Paranasal sinus mucosal thickening and fluid. Correlate clinically for acute sinusitis.  09-09-14: chest x-ray: CHF     LABS REVIEWED:   12-18-13: hgb a1c 6.2; tsh 0.858 12-27-13: wbc 9.5; hgb 10.1; hct 32.0; mcv 90.1; plt 275; glucose 169; bun 22; creat 0.97; k+3.9; na++142; liver normal albumin 2.3; BNP 4645 12-28-13: ammonia 26 12-30-13: glucose 120; bun 24; creat 2.1; k+ 4.0; na++142 01-05-14: glucose 79; bun 28; creat 2.46; k+4.2; na++ 140  01-11-14: glucose 147;  bun 21.6; creat 1.52; k+4.8; na++140  03-20-14: wbc 7.4; hgb 8.9; hct 32.1; mcv 94.4; plt 261; glucose 187; bun 19.6; creat 2.05; k+3.6; na++142 04-06-14: wbc 4.1; hgb 9.6; hct 30.7; mcv 89.7 plt 212; glucose 91; bun 15; creat 1.26; k+3.9;  na++136 liver normal albumin 2.6  05-20-14; wbc 16.7; hgb 7.5; hct 23.5;mcv 86.6; plt 251; glucose 141; bun 36; creat 1.83; k+ 4.1; na++137; ast 71; albumin 2.0 05-20-14: wbc 16.4; hgb 7.5; hct 24.5; mcv 89.1; plt 243; glucose 169; bun 38; creat 2.31; k+3.9; na++137; ast 111; albumin 1.8; urine culture: proteus mirabilis: ceftin 05-22-14: wbc 24.0; hgb 9.3; hct 29.2; mcv 87.;4 plt 226; glucose 138; bun 43; creat 2.06; k+3.0; na++138; ca++6.9 05-25-14: wbc 15.6; hgb 8.7; hct 28.8; mcv 90.6 ;plt 323; glucose 96; bun 39; creat 1.71; k+5.3; na++139; ca++7.9; guaiac +  06-08-14: wbc 7.2; hgb 8.2; hct 26.1; mcv 89.5; plt 257; glucose 59; bun 22; creat 0.96; k+4.2; na++139; liver normal albumin 1.6; hgb a1c 5.0; chol 113;ldl 64; trig 72; hdl 29  09-13-14: wbc 13.9 hgb 7.4; hct 26.6; mcv 95.0; plt 428; glucose 68; bun 17.3; creat 0.96; k+ 4.8; na++136     Review of Systems Unable to perform  ROS: mental acuity    Physical Exam Constitutional: No distress.  Obese   Neck: Neck supple. No JVD present. No thyromegaly present.  Cardiovascular: Normal rate and regular rhythm.   Pedal pulses present status post right metatarsal amputation   Respiratory: Effort normal and breath sounds normal. No respiratory distress.  GI: Soft. Bowel sounds are normal. She exhibits no distension.  Genitourinary:  Has foley   Musculoskeletal: She exhibits no edema.  Is able to move all extremities   Neurological: not responsive  Skin: Skin is warm and dry. She is not diaphoretic.      ASSESSMENT/ PLAN:  1. Ams: will send her to the ED at this time for further evaluation and treatment options.    Time spent with patient   30  minutes >50% time spent counseling; reviewing medical record; tests; labs; and developing future plan of care   Ok Edwards NP [facility]  Contact [phone] Monday through Friday 8am- 5pm  After hours call [phone]

## 2014-11-10 NOTE — H&P (Addendum)
[facility] History and Physical  [name] ULA:[id number] DOB: Jun 16, 1940 DOA: 11/11/2014  Referring physician: Dr Johnney Killian PCP: Gildardo Cranker, DO   Chief Complaint: AMS  HPI: [name] is a 74 y.o. female with PMH significant for Stroke, blindness, recurrent UTI, chronic indwelling foley catheter, bed bound, at baseline but is verbally interactive at baseline. Presents from SNF with AMS. Per report, the patient had decreased level of alertness since the day prior to admission.   Evaluation in the ED; WBC at 15, Hb at 7.2, cr at 2.01, glucose at 44. Hypothermia.     Review of Systems:  Unable to obtain from patient due to AMS>   Past Medical History  Diagnosis Date  . Hypertension   . Diabetes mellitus   . Blindness   . TIA (transient ischemic attack)   . Anemia   . GERD (gastroesophageal reflux disease)   . Constipation   . Renal disorder 05/2012    acute Renal Failure  . SYNCOPE 05/19/2009    Annotation: multiple over past 1 yr  Qualifier: Diagnosis of  By: [name] MD, [name]    . CHOLECYSTECTOMY, HX OF 05/19/2009    Qualifier: Diagnosis of  By: [name] MD, [name]    . 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 Chronic osteomyelitis of toe of right foot ([place]) 09/19/2013    3rd, 4th, 5th   . Acute pyelonephritis 05/25/2014  . Oral thrush 05/23/2014  . Chronic kidney disease (CKD), stage IV (severe) ([place]) 05/21/2014  . HCAP (healthcare-associated pneumonia) 05/20/2014  . Chronic diastolic CHF (congestive heart failure) ([place]) 03/02/2014   Past Surgical History  Procedure Laterality Date  . Gallbladder surgery    . Esophageal dilation    . Cholecystectomy    . Amputation  02/18/2012    Procedure: AMPUTATION RAY;  Surgeon: [name], MD;  Location: [facility];  Service: Orthopedics;  Laterality: Right;  right second toe  . Tee without cardioversion  02/21/2012    Procedure: TRANSESOPHAGEAL ECHOCARDIOGRAM (TEE);  Surgeon: [name], MD;  [facility] END[facility]COPY;  Service:  Cardiovascular;  Laterality: N/A;   spoke with [name] from [place] pt will arrive by 815ish( thy change shifts at [place])  . Tee without cardioversion  02/25/2012    Procedure: TRANSESOPHAGEAL ECHOCARDIOGRAM (TEE);  Surgeon: [name], MD;  Location: [place];  Service: Cardiovascular;  Laterality: N/A;  . Amputation Right 09/07/2013    Procedure: AMPUTATION RAY;  Surgeon: [name], MD;  Location: [place];  Service: Orthopedics;  Laterality: Right;  Right Transmetatarsal Amputation   Social History:  reports that she quit smoking about 15 years ago. Her smokeless tobacco use includes Snuff. She reports that she does not drink alcohol or use illicit drugs.  No Known Allergies  Family History  Problem Relation Age of Onset  . Heart failure Mother     Prior to Admission medications   Medication Sig Start Date End Date Taking? Authorizing Provider  acetaminophen (TYLENOL) 325 MG tablet Take 650 mg by mouth 3 (three) times daily.    Yes Historical Provider, MD  Amino Acids-Protein Hydrolys (PRO-STAT) LIQD Take 60 mLs by mouth 3 (three) times daily.    Yes Historical Provider, MD  aspirin EC 325 MG tablet Take 325 mg by mouth daily. For heart failure   Yes Historical Provider, MD  Brinzolamide-Brimonidine [facility]) 1-0.2 % SUSP Place 1 drop into the left eye 2 (two) times daily.   Yes Historical Provider, MD  calcium carbonate (OS-CAL) 1250 (500 CA)  MG chewable tablet Chew 1 tablet by mouth 3 (three) times daily.   Yes Historical Provider, MD  carvedilol (COREG) 3.125 MG tablet Take 3.125 mg by mouth 2 (two) times daily with a meal.   Yes Historical Provider, MD  docusate sodium (COLACE) 100 MG capsule Take 100 mg by mouth 2 (two) times daily.   Yes Historical Provider, MD  hydrocortisone (ANUSOL-HC) 2.5 % rectal cream Place 1 application rectally 2 (two) times daily as needed for hemorrhoids.    Yes Historical Provider, MD  ipratropium-albuterol (DUONEB) 0.5-2.5 (3) MG/3ML SOLN Take 3 mLs  by nebulization every 6 (six) hours as needed (wheezing).   Yes Historical Provider, MD  metoCLOPramide (REGLAN) 5 MG tablet Take 1 tablet (5 mg total) by mouth 4 (four) times daily -  before meals and at bedtime. 12/22/13  Yes [name], MD  Morphine Sulfate (MORPHINE CONCENTRATE) 10 MG/0.5ML SOLN concentrated solution Take 0.5 mLs (10 mg total) by mouth every 2 (two) hours as needed for severe pain. 05/25/14  Yes Venetia Maxon Rama, MD  Multiple Vitamins-Minerals (DECUBI-VITE) CAPS Take 2 capsules by mouth daily.   Yes Historical Provider, MD  NON FORMULARY Take 240 mLs by mouth 3 (three) times daily. 2-cal supplement 240 cc three times daily   Yes Historical Provider, MD  OXYGEN Inhale 2 L into the lungs continuous.   Yes Historical Provider, MD  polyethylene glycol (MIRALAX / GLYCOLAX) packet Take 17 g by mouth daily. 06/18/12  Yes Linton Flemings, MD  promethazine (PHENERGAN) 25 MG tablet Take 25 mg by mouth every 6 (six) hours as needed for nausea or vomiting.   Yes Historical Provider, MD  sertraline (ZOLOFT) 20 MG/ML concentrated solution Take 1.3 mLs (26 mg total) by mouth daily. Patient taking differently: Take 100 mg by mouth daily.  05/25/14  Yes Venetia Maxon Rama, MD  sodium chloride (OCEAN) 0.65 % SOLN nasal spray Place 1 spray into both nostrils every 6 (six) hours as needed for congestion.   Yes Historical Provider, MD  timolol (TIMOPTIC) 0.5 % ophthalmic solution Place 1 drop into the left eye 2 (two) times daily.   Yes Historical Provider, MD   Physical Exam: Filed Vitals:   11/05/2014 1601 11/21/2014 1612 11/08/2014 1630 11/21/2014 1700  BP: 92/57  93/49 94/48  Pulse: 67  71 74  Temp:  92.7 F (33.7 C)    TempSrc:  Rectal    Resp: 16  15 16   Height:      Weight:      SpO2: 100%  100% 99%    Wt Readings from Last 3 Encounters:  11/13/2014 72.576 kg (160 lb)  11/13/2014 71.668 kg (158 lb)  10/19/14 74.39 kg (164 lb)    General:  Patient open eyes, turn head to voice. She is  moaning.  Eyes: blind old finding.  ENT: grossly normal hearing, lips & tongue dry  Neck: no LAD, masses or thyromegaly Cardiovascular: RRR, no m/r/g. No LE edema. Telemetry: SR, no arrhythmias  Respiratory: CTA bilaterally, no w/r/r. Normal respiratory effort. Abdomen: soft, ntnd Skin: unable to evaluate decubitus  Musculoskeletal: grossly normal tone BUE/BLE Psychiatric: grossly normal mood and affect, speech fluent and appropriate Neurologic: lethargic, contracted,.           Labs on Admission:  Basic Metabolic Panel:  Recent Labs Lab 10/30/2014 1432  NA 138  K 3.7  CL 106  CO2 23  GLUCOSE 44*  BUN 28*  CREATININE 2.01*  CALCIUM 7.6*   Liver Function  Tests:  Recent Labs Lab 11/22/2014 1432  AST 40  ALT 24  ALKPHOS 230*  BILITOT 0.8  PROT 5.3*  ALBUMIN 1.2*   No results for input(s): LIPASE, AMYLASE in the last 168 hours. No results for input(s): AMMONIA in the last 168 hours. CBC:  Recent Labs Lab 11/25/2014 1432  WBC 15.2*  NEUTROABS 13.4*  HGB 7.2*  HCT 22.1*  MCV 83.7  PLT 268   Cardiac Enzymes: No results for input(s): CKTOTAL, CKMB, CKMBINDEX, TROPONINI in the last 168 hours.  BNP (last 3 results)  Recent Labs  03/07/14 0615  BNP 1522.4*    ProBNP (last 3 results)  Recent Labs  12/26/13 1640 12/27/13 0040  PROBNP 5360.0* 4645.0*    CBG:  Recent Labs Lab 11/26/2014 1613  GLUCAP 125*    Radiological Exams on Admission: Dg Chest Port 1 View  11/16/2014  CLINICAL DATA:  Sepsis. EXAM: PORTABLE CHEST 1 VIEW COMPARISON:  May 20, 2014. FINDINGS: Stable cardiomegaly. No pneumothorax or pleural effusion is noted. Right basilar airspace opacity is noted concerning for possible pneumonia or atelectasis. Minimal scarring or subsegmental atelectasis is noted laterally in left lung base. Bony thorax is intact. IMPRESSION: New right basilar opacity is noted concerning for pneumonia or subsegmental atelectasis. Minimal left midlung subsegmental  atelectasis or scarring is noted. Electronically Signed   By: Marijo Conception, M.D.   On: 11/02/2014 15:20    EKG: Independently reviewed. Sinus rhythm. RBBB  Assessment/Plan Active Problems:   Type 2 diabetes, controlled, with peripheral circulatory disorder (HCC)   Blindness   UTI (lower urinary tract infection)   FTT (failure to thrive) in adult   HCAP (healthcare-associated pneumonia)   Chronic kidney disease (CKD), stage IV (severe) ([place])   Sepsis (HCC)  1-Sepsis: presents with leukocytosis, hypothermia. Source could be PNA vs UTI.  Continue with IV fluids. IV antibiotics.  Bear huger for hypothermia.  Hydrocortisone stress dose.  Follow up blood culture, urine culture. Cortisol level.  Addendum; hypotension persist:  CCM consulted. Another IV bolus ordered.   2-Encephalopathy; suspect multifactorial, secondary to hypoglycemia, sepsis, infectious.  IV fluids, IV antibiotics. CBG improved after amp D 50.   3-Hypoglycemia; IV fluids, D 5. Check CBG every 4 hours.   4-Acute on Chronic CKD stage IV; cr per records at 1.7 Related to hypotension, UTI. Continue with IV fluids.   5-Anemia; hb at 7, will transfuse one unit PRBC. Suspect component of anemia of CKD>   6-history of decubitus ; wound care consulted.   Code Status: Full Code. Needs further discussion with legal guardian and family. Most form accompanied the patient, per the form patient is full code.   DVT Prophylaxis: Lovenox.  Family Communication: daughter doesn't answer/  discussed with SW, legal guardian, patient is not under guard of state , Daughter is the main person for medical decision.  Disposition Plan: expect 3 to 4 days inpatient.   Time spent: 75 minutes.   Niel Hummer A [facility] Pager [phone]

## 2014-11-10 NOTE — Progress Notes (Signed)
1845 Patient BP on arrival 68/35, O2 sats on 2L nasal cannula dropping to 79-82%.  Patient unresponsive.  MD notified.  Order received for 500cc NS bolus.  Will continue to monitor.

## 2014-11-10 NOTE — ED Notes (Signed)
IV attempted by EDP, but unsuccessful.

## 2014-11-10 NOTE — ED Provider Notes (Signed)
CSN: [id number]     Arrival date & time 11/06/2014  1328 History   First MD Initiated Contact with Patient 11/09/2014 1354     Chief Complaint  Patient presents with  . Altered Mental Status     (Consider location/radiation/quality/duration/timing/severity/associated sxs/prior Treatment) HPI Patient is sent from [facility] living with a reported change in mental status. By report, the patient had decreased level of alertness yesterday. The patient is bedbound and blind however reportedly is verbally interactive at baseline. Today she has been nonverbal. She has chronic indwelling Foley catheter. Patient is alone at time of presentation. There is no family members to give additional history as to the patient's baseline or other agents over the past several days. Past Medical History  Diagnosis Date  . Hypertension   . Diabetes mellitus   . Blindness   . TIA (transient ischemic attack)   . Anemia   . GERD (gastroesophageal reflux disease)   . Constipation   . Renal disorder 05/2012    acute Renal Failure  . SYNCOPE 05/19/2009    Annotation: multiple over past 1 yr  Qualifier: Diagnosis of  By: [name] MD, [name]    . CHOLECYSTECTOMY, HX OF 05/19/2009    Qualifier: Diagnosis of  By: [name] MD, [name]    . 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 Chronic osteomyelitis of toe of right foot ([place]) 09/19/2013    3rd, 4th, 5th   . Acute pyelonephritis 05/25/2014  . Oral thrush 05/23/2014  . Chronic kidney disease (CKD), stage IV (severe) ([place]) 05/21/2014  . HCAP (healthcare-associated pneumonia) 05/20/2014  . Chronic diastolic CHF (congestive heart failure) ([place]) 03/02/2014   Past Surgical History  Procedure Laterality Date  . Gallbladder surgery    . Esophageal dilation    . Cholecystectomy    . Amputation  02/18/2012    Procedure: AMPUTATION RAY;  Surgeon: [name], MD;  [facility];  Service: Orthopedics;  Laterality: Right;  right second toe  . Tee without cardioversion  02/21/2012    Procedure:  TRANSESOPHAGEAL ECHOCARDIOGRAM (TEE);  Surgeon: [name], MD;  [facility] ENDOSCOPY;  Service: Cardiovascular;  Laterality: N/A;   spoke with [name] from [place] pt will arrive by 815ish( thy change shifts at [place])  . Tee without cardioversion  02/25/2012    Procedure: TRANSESOPHAGEAL ECHOCARDIOGRAM (TEE);  Surgeon: [name], MD;  Location: [place];  Service: Cardiovascular;  Laterality: N/A;  . Amputation Right 09/07/2013    Procedure: AMPUTATION RAY;  Surgeon: [name], MD;  Location: [place];  Service: Orthopedics;  Laterality: Right;  Right Transmetatarsal Amputation   Family History  Problem Relation Age of Onset  . Heart failure Mother    Social History  Substance Use Topics  . Smoking status: Former Smoker -- 0 years    Quit date: 06/25/1999  . Smokeless tobacco: Current User    Types: Snuff  . Alcohol Use: No   OB History    No data available     Review of Systems A cannot perform review systems level V caveat    Allergies  Review of patient's allergies indicates no known allergies.  Home Medications   Prior to Admission medications   Medication Sig Start Date End Date Taking? Authorizing Provider  acetaminophen (TYLENOL) 325 MG tablet Take 650 mg by mouth 3 (three) times daily.    Yes Historical Provider, MD  Amino Acids-Protein Hydrolys (PRO-STAT) LIQD Take 60 mLs by mouth 3 (three) times daily.    Yes Historical  Provider, MD  aspirin EC 325 MG tablet Take 325 mg by mouth daily. For heart failure   Yes Historical Provider, MD  Brinzolamide-Brimonidine [facility]) 1-0.2 % SUSP Place 1 drop into the left eye 2 (two) times daily.   Yes Historical Provider, MD  calcium carbonate (OS-CAL) 1250 (500 CA) MG chewable tablet Chew 1 tablet by mouth 3 (three) times daily.   Yes Historical Provider, MD  carvedilol (COREG) 3.125 MG tablet Take 3.125 mg by mouth 2 (two) times daily with a meal.   Yes Historical Provider, MD  docusate sodium (COLACE) 100 MG capsule  Take 100 mg by mouth 2 (two) times daily.   Yes Historical Provider, MD  hydrocortisone (ANUSOL-HC) 2.5 % rectal cream Place 1 application rectally 2 (two) times daily as needed for hemorrhoids.    Yes Historical Provider, MD  ipratropium-albuterol (DUONEB) 0.5-2.5 (3) MG/3ML SOLN Take 3 mLs by nebulization every 6 (six) hours as needed (wheezing).   Yes Historical Provider, MD  metoCLOPramide (REGLAN) 5 MG tablet Take 1 tablet (5 mg total) by mouth 4 (four) times daily -  before meals and at bedtime. 12/22/13  Yes [name], MD  Morphine Sulfate (MORPHINE CONCENTRATE) 10 MG/0.5ML SOLN concentrated solution Take 0.5 mLs (10 mg total) by mouth every 2 (two) hours as needed for severe pain. 05/25/14  Yes Venetia Maxon Rama, MD  Multiple Vitamins-Minerals (DECUBI-VITE) CAPS Take 2 capsules by mouth daily.   Yes Historical Provider, MD  NON FORMULARY Take 240 mLs by mouth 3 (three) times daily. 2-cal supplement 240 cc three times daily   Yes Historical Provider, MD  OXYGEN Inhale 2 L into the lungs continuous.   Yes Historical Provider, MD  polyethylene glycol (MIRALAX / GLYCOLAX) packet Take 17 g by mouth daily. 06/18/12  Yes Linton Flemings, MD  promethazine (PHENERGAN) 25 MG tablet Take 25 mg by mouth every 6 (six) hours as needed for nausea or vomiting.   Yes Historical Provider, MD  sertraline (ZOLOFT) 20 MG/ML concentrated solution Take 1.3 mLs (26 mg total) by mouth daily. Patient taking differently: Take 100 mg by mouth daily.  05/25/14  Yes Venetia Maxon Rama, MD  sodium chloride (OCEAN) 0.65 % SOLN nasal spray Place 1 spray into both nostrils every 6 (six) hours as needed for congestion.   Yes Historical Provider, MD  timolol (TIMOPTIC) 0.5 % ophthalmic solution Place 1 drop into the left eye 2 (two) times daily.   Yes Historical Provider, MD   BP 92/59 mmHg  Pulse 66  Resp 18  Ht 4\' 10"  (1.473 m)  Wt 160 lb (72.576 kg)  BMI 33.45 kg/m2  SpO2 100% Physical Exam  Constitutional:  Patient is  lying supine. Her eyes are open. She is blind at baseline. She does seem to turn towards me slightly to voice. She does not have acute respiratory distress. She is debilitated in appearance and pale.  HENT:  Head: Normocephalic and atraumatic.  Mouth/Throat: Oropharynx is clear and moist.  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 Neither have any functional appearance.  Cardiovascular: Normal rate, regular rhythm, normal heart sounds and intact distal pulses.   Pulmonary/Chest: Effort normal.  Occasionally rhonchi was noted. Patient has symmetric air flow.  Abdominal: Soft. Bowel sounds are normal. She exhibits no distension.  Musculoskeletal:  The patient has contractures of the feet. She is wearing [place] boots. There is a dressing over the  anterior right lower leg. There does not appear to be significant edema or cellulitis. Upper extremities are fairly stiff and contracted. The patient does have 2 fairly superficial decubiti ulcers on the sacrum.  Neurological:  Patient is awake in appearance but is not having any meaningful verbal interaction She can't seem to follow any particular commands for neurologic evaluation.   Skin: Skin is warm and dry. No rash noted.    ED Course  Procedures (including critical care time) CRITICAL CARE Performed by: Charlesetta Shanks   Total critical care time: 30  Critical care time was exclusive of separately billable procedures and treating other patients.  Critical care was necessary to treat or prevent imminent or life-threatening deterioration.  Critical care was time spent personally by me on the following activities: development of treatment plan with patient and/or surrogate as well as nursing, discussions with consultants, evaluation of patient's response to treatment, examination of patient, obtaining history from patient or surrogate, ordering and performing treatments  and interventions, ordering and review of laboratory studies, ordering and review of radiographic studies, pulse oximetry and re-evaluation of patient's condition. Labs Review Labs Reviewed  COMPREHENSIVE METABOLIC PANEL - Abnormal; Notable for the following:    Glucose, Bld 44 (*)    BUN 28 (*)    Creatinine, Ser 2.01 (*)    Calcium 7.6 (*)    Total Protein 5.3 (*)    Albumin 1.2 (*)    Alkaline Phosphatase 230 (*)    GFR calc non Af Amer 24 (*)    GFR calc Af Amer 27 (*)    All other components within normal limits  CBC WITH DIFFERENTIAL/PLATELET - Abnormal; Notable for the following:    WBC 15.2 (*)    RBC 2.64 (*)    Hemoglobin 7.2 (*)    HCT 22.1 (*)    RDW 21.4 (*)    Neutro Abs 13.4 (*)    All other components within normal limits  URINALYSIS, ROUTINE W REFLEX MICROSCOPIC (NOT AT [facility]) - Abnormal; Notable for the following:    APPearance TURBID (*)    Hgb urine dipstick LARGE (*)    Leukocytes, UA LARGE (*)    All other components within normal limits  URINE MICROSCOPIC-ADD ON - Abnormal; Notable for the following:    Squamous Epithelial / LPF FEW (*)    Bacteria, UA MANY (*)    All other components within normal limits  CULTURE, BLOOD (ROUTINE X 2)  CULTURE, BLOOD (ROUTINE X 2)  URINE CULTURE  I-STAT CG4 LACTIC ACID, ED    Imaging Review Dg Chest Port 1 View  10/30/2014  CLINICAL DATA:  Sepsis. EXAM: PORTABLE CHEST 1 VIEW COMPARISON:  May 20, 2014. FINDINGS: Stable cardiomegaly. No pneumothorax or pleural effusion is noted. Right basilar airspace opacity is noted concerning for possible pneumonia or atelectasis. Minimal scarring or subsegmental atelectasis is noted laterally in left lung base. Bony thorax is intact. IMPRESSION: New right basilar opacity is noted concerning for pneumonia or subsegmental atelectasis. Minimal left midlung subsegmental atelectasis or scarring is noted. Electronically Signed   By: Marijo Conception, M.D.   On: 11/11/2014 15:20   I have  personally reviewed and evaluated these images and lab results as part of my medical decision-making.   EKG Interpretation None      MDM   Final diagnoses:  Sepsis ([place])   Patient presents hypothermic with hypotension. Report is for mental status change without other specific localizing symptoms reported. Chest x-ray does show an  area of consolidation suspicious for pneumonia. Some rhonchi was also noted on physical examination. The patient's baseline is bedridden with significantly compromised function. Her paperwork indicates full code. At this time a septic management is initiated.    Charlesetta Shanks, MD 11/01/2014 [phone]

## 2014-11-11 ENCOUNTER — Inpatient Hospital Stay (HOSPITAL_COMMUNITY): Payer: Medicare Other

## 2014-11-11 DIAGNOSIS — N390 Urinary tract infection, site not specified: Secondary | ICD-10-CM

## 2014-11-11 DIAGNOSIS — Z789 Other specified health status: Secondary | ICD-10-CM

## 2014-11-11 DIAGNOSIS — L8990 Pressure ulcer of unspecified site, unspecified stage: Secondary | ICD-10-CM | POA: Insufficient documentation

## 2014-11-11 LAB — GLUCOSE, CAPILLARY
GLUCOSE-CAPILLARY: 142 mg/dL — AB (ref 65–99)
GLUCOSE-CAPILLARY: 215 mg/dL — AB (ref 65–99)
GLUCOSE-CAPILLARY: 217 mg/dL — AB (ref 65–99)
GLUCOSE-CAPILLARY: 127 mg/dL — AB (ref 65–99)
Glucose-Capillary: 138 mg/dL — ABNORMAL HIGH (ref 65–99)
Glucose-Capillary: 173 mg/dL — ABNORMAL HIGH (ref 65–99)

## 2014-11-11 LAB — COMPREHENSIVE METABOLIC PANEL
ALBUMIN: 1.2 g/dL — AB (ref 3.5–5.0)
ALK PHOS: 212 U/L — AB (ref 38–126)
ALT: 26 U/L (ref 14–54)
AST: 43 U/L — AB (ref 15–41)
Anion gap: 9 (ref 5–15)
BILIRUBIN TOTAL: 1.1 mg/dL (ref 0.3–1.2)
BUN: 25 mg/dL — AB (ref 6–20)
CALCIUM: 6.8 mg/dL — AB (ref 8.9–10.3)
CO2: 17 mmol/L — ABNORMAL LOW (ref 22–32)
CREATININE: 1.72 mg/dL — AB (ref 0.44–1.00)
Chloride: 111 mmol/L (ref 101–111)
GFR calc Af Amer: 33 mL/min — ABNORMAL LOW (ref >60)
GFR calc non Af Amer: 29 mL/min — ABNORMAL LOW (ref >60)
GLUCOSE: 243 mg/dL — AB (ref 65–99)
Potassium: 2.8 mmol/L — ABNORMAL LOW (ref 3.5–5.1)
Sodium: 137 mmol/L (ref 135–145)
TOTAL PROTEIN: 5.1 g/dL — AB (ref 6.5–8.1)

## 2014-11-11 LAB — CBC
HEMATOCRIT: 27.2 % — AB (ref 36.0–46.0)
Hemoglobin: 9.5 g/dL — ABNORMAL LOW (ref 12.0–15.0)
MCH: 28.4 pg (ref 26.0–34.0)
MCHC: 34.9 g/dL (ref 30.0–36.0)
MCV: 81.2 fL (ref 78.0–100.0)
Platelets: 246 10*3/uL (ref 150–400)
RBC: 3.35 MIL/uL — ABNORMAL LOW (ref 3.87–5.11)
RDW: 19.1 % — ABNORMAL HIGH (ref 11.5–15.5)
WBC: 21.3 10*3/uL — AB (ref 4.0–10.5)

## 2014-11-11 LAB — BASIC METABOLIC PANEL
ANION GAP: 12 (ref 5–15)
BUN: 23 mg/dL — ABNORMAL HIGH (ref 6–20)
CHLORIDE: 112 mmol/L — AB (ref 101–111)
CO2: 12 mmol/L — AB (ref 22–32)
Calcium: 6.6 mg/dL — ABNORMAL LOW (ref 8.9–10.3)
Creatinine, Ser: 1.78 mg/dL — ABNORMAL HIGH (ref 0.44–1.00)
GFR calc non Af Amer: 27 mL/min — ABNORMAL LOW (ref >60)
GFR, EST AFRICAN AMERICAN: 32 mL/min — AB (ref >60)
GLUCOSE: 310 mg/dL — AB (ref 65–99)
Potassium: 3.7 mmol/L (ref 3.5–5.1)
Sodium: 136 mmol/L (ref 135–145)

## 2014-11-11 LAB — BLOOD GAS, ARTERIAL
Acid-base deficit: 12 mmol/L — ABNORMAL HIGH (ref 0.0–2.0)
Bicarbonate: 12.2 mEq/L — ABNORMAL LOW (ref 20.0–24.0)
Drawn by: 331471
O2 CONTENT: 3 L/min
O2 Saturation: 83.4 %
PCO2 ART: 23.4 mmHg — AB (ref 35.0–45.0)
PH ART: 7.338 — AB (ref 7.350–7.450)
PO2 ART: 53.1 mmHg — AB (ref 80.0–100.0)
Patient temperature: 37
TCO2: 11.7 mmol/L (ref 0–100)

## 2014-11-11 LAB — TYPE AND SCREEN
ABO/RH(D): A POS
ANTIBODY SCREEN: NEGATIVE
Unit division: 0

## 2014-11-11 LAB — TSH: TSH: 1.409 u[IU]/mL (ref 0.350–4.500)

## 2014-11-11 LAB — LACTIC ACID, PLASMA: Lactic Acid, Venous: 5.9 mmol/L (ref 0.5–2.0)

## 2014-11-11 LAB — CORTISOL

## 2014-11-11 MED ORDER — INSULIN ASPART 100 UNIT/ML ~~LOC~~ SOLN
0.0000 [IU] | SUBCUTANEOUS | Status: DC
  Administered 2014-11-11: 2 [IU] via SUBCUTANEOUS
  Administered 2014-11-11: 3 [IU] via SUBCUTANEOUS
  Administered 2014-11-12: 5 [IU] via SUBCUTANEOUS

## 2014-11-11 MED ORDER — SODIUM CHLORIDE 0.9 % IV BOLUS (SEPSIS)
500.0000 mL | Freq: Once | INTRAVENOUS | Status: AC
  Administered 2014-11-11: 500 mL via INTRAVENOUS

## 2014-11-11 MED ORDER — CHLORHEXIDINE GLUCONATE 0.12 % MT SOLN
15.0000 mL | Freq: Two times a day (BID) | OROMUCOSAL | Status: DC
  Administered 2014-11-11 (×2): 15 mL via OROMUCOSAL
  Filled 2014-11-11 (×2): qty 15

## 2014-11-11 MED ORDER — POTASSIUM CHLORIDE 10 MEQ/50ML IV SOLN
10.0000 meq | INTRAVENOUS | Status: AC
  Administered 2014-11-11 (×5): 10 meq via INTRAVENOUS
  Filled 2014-11-11 (×6): qty 50

## 2014-11-11 MED ORDER — MORPHINE SULFATE (PF) 2 MG/ML IV SOLN: 1.0000 mg | INTRAVENOUS | Status: DC | PRN

## 2014-11-11 MED ORDER — GLYCOPYRROLATE 0.2 MG/ML IJ SOLN
0.2000 mg | INTRAMUSCULAR | Status: DC | PRN
  Administered 2014-11-11: 0.2 mg via INTRAVENOUS
  Filled 2014-11-11 (×2): qty 1

## 2014-11-11 MED ORDER — COLLAGENASE 250 UNIT/GM EX OINT
TOPICAL_OINTMENT | Freq: Every day | CUTANEOUS | Status: DC
  Administered 2014-11-11: 17:00:00 via TOPICAL
  Filled 2014-11-11: qty 30

## 2014-11-11 MED ORDER — VASOPRESSIN 20 UNIT/ML IV SOLN
0.0300 [IU]/min | INTRAVENOUS | Status: DC
  Administered 2014-11-11: 0.03 [IU]/min via INTRAVENOUS
  Filled 2014-11-11: qty 2

## 2014-11-11 MED ORDER — VITAMIN K1 10 MG/ML IJ SOLN
10.0000 mg | Freq: Once | INTRAMUSCULAR | Status: AC
  Administered 2014-11-11: 10 mg via SUBCUTANEOUS
  Filled 2014-11-11: qty 1

## 2014-11-11 MED ORDER — NOREPINEPHRINE BITARTRATE 1 MG/ML IV SOLN
2.0000 ug/min | INTRAVENOUS | Status: DC
  Administered 2014-11-11: 30 ug/min via INTRAVENOUS
  Administered 2014-11-11: 40 ug/min via INTRAVENOUS
  Filled 2014-11-11 (×4): qty 16

## 2014-11-11 MED ORDER — SODIUM CHLORIDE 0.9 % IV BOLUS (SEPSIS)
1000.0000 mL | Freq: Once | INTRAVENOUS | Status: AC
  Administered 2014-11-11: 1000 mL via INTRAVENOUS

## 2014-11-11 MED ORDER — GLYCOPYRROLATE 0.2 MG/ML IJ SOLN
0.2000 mg | INTRAMUSCULAR | Status: DC
  Filled 2014-11-11 (×4): qty 1

## 2014-11-11 MED ORDER — POTASSIUM CHLORIDE 10 MEQ/50ML IV SOLN
10.0000 meq | INTRAVENOUS | Status: DC
Start: 2014-11-11 — End: 2014-11-11

## 2014-11-11 MED ORDER — DEXTROSE 5 % IV SOLN
2.0000 ug/min | INTRAVENOUS | Status: DC
  Administered 2014-11-11: 32 ug/min via INTRAVENOUS
  Administered 2014-11-11: 25 ug/min via INTRAVENOUS
  Administered 2014-11-11: 2 ug/min via INTRAVENOUS
  Administered 2014-11-11: 30 ug/min via INTRAVENOUS
  Administered 2014-11-11: 40 ug/min via INTRAVENOUS
  Filled 2014-11-11 (×4): qty 4

## 2014-11-11 MED ORDER — VANCOMYCIN HCL IN DEXTROSE 750-5 MG/150ML-% IV SOLN
750.0000 mg | INTRAVENOUS | Status: DC
  Administered 2014-11-11: 750 mg via INTRAVENOUS
  Filled 2014-11-11 (×2): qty 150

## 2014-11-11 MED ORDER — CETYLPYRIDINIUM CHLORIDE 0.05 % MT LIQD
7.0000 mL | Freq: Two times a day (BID) | OROMUCOSAL | Status: DC
  Administered 2014-11-11: 7 mL via OROMUCOSAL

## 2014-11-11 NOTE — Progress Notes (Signed)
[facility] Progress Note Patient Name: Ranay Ketter DOB: 03/26/1940 MRN: [id number]   Date of Service  11/11/2014  HPI/Events of Note  -Hypotension -Elevated INR, not on coumadin.   eICU Interventions  Started levophed.  Vitamin K ordered.      Intervention Category Major Interventions: Hypotension - evaluation and management  Laverle Hobby 11/11/2014, 12:14 AM

## 2014-11-11 NOTE — Progress Notes (Signed)
eLink Physician-Brief Progress Note Patient Name: [name] DOB: [birth year] MRN: [id number]   Date of Service  11/11/2014  HPI/Events of Note  Low K  eICU Interventions  Ordered replacement.      Intervention Category Intermediate Interventions: Electrolyte abnormality - evaluation and management  Laverle Hobby 11/11/2014, 6:26 AM

## 2014-11-11 NOTE — Progress Notes (Signed)
   11/11/14 1600  Clinical Encounter Type  Visited With Patient and family together;Family  Visit Type Psychological support;Spiritual support;Initial;Social support;Other (Comment);Patient actively dying;Critical Care  Referral From Nurse  Consult/Referral To Nurse;Chaplain  Spiritual Encounters  Spiritual Needs Grief support;Emotional  Stress Factors  Family Stress Factors Loss    Paged by family to support pt's family in anticipatory grief.  Daughter understands pt is declining and will not survive hospitalization.  Communicates shock, as recently they were sharing a meal and laughing together.  Daughter also reports that pt was "in a similar condition" in April and she "pulled through," but after conversation with palliative MD, seems to understand this will not be the case this time.  Chaplain provided space for family to engage in anticipatory grief and eulogize [name].  Provided pastoral presence and support at bedside.   Daughter uncertain whether to remain at hospital overnight, as she does not know how quickly her mother will decline.  She is concerned for calling Wishes to speak with RN.  Chaplain communicated this to RN.    Forwarding case to night chaplain and will follow up for support in AM.    [place], [place]

## 2014-11-11 NOTE — Progress Notes (Signed)
eLink Physician-Brief Progress Note Patient Name: [name] DOB: 11/01/1940 MRN: [id number]   Date of Service  11/11/2014  HPI/Events of Note  Low K  eICU Interventions  Ordered 50 meq Kcl.      Intervention Category Intermediate Interventions: Electrolyte abnormality - evaluation and management  Laverle Hobby 11/11/2014, 5:53 AM

## 2014-11-11 NOTE — Procedures (Signed)
Arterial Catheter Insertion Procedure Note [name] [id number] December 29, 1940  Procedure: Insertion of Arterial Catheter  Indications: Blood pressure monitoring  Procedure Details Consent: Risks of procedure as well as the alternatives and risks of each were explained to the (patient/caregiver).  Consent for procedure obtained. Time Out: Verified patient identification, verified procedure, site/side was marked, verified correct patient position, special equipment/implants available, medications/allergies/relevent history reviewed, required imaging and test results available.  Performed  Maximum sterile technique was used including antiseptics, cap, gloves, gown, hand hygiene and mask. Skin prep: Chlorhexidine; local anesthetic administered 20 gauge catheter was inserted into right radial artery using the Seldinger technique.  Evaluation Blood flow good; BP tracing good. Complications: No apparent complications.   [name] P 11/11/2014

## 2014-11-11 NOTE — Progress Notes (Signed)
The norepinephrine drip is running at a high rate so I will change it to quadruple strength with the next bag. I discussed this change with her nurse, Sol.   Romeo Rabon, PharmD, pager [phone]. 11/11/2014,10:49 AM.

## 2014-11-11 NOTE — Progress Notes (Signed)
eLink Physician-Brief Progress Note Patient Name: [name] DOB: 1940-05-11 MRN: [id number]   Date of Service  11/11/2014  HPI/Events of Note  Persistent hypotension on levophed.   eICU Interventions  Discussed with RT, RN, will have RT place arterial line and wean down levophed.      Intervention Category Major Interventions: Hypotension - evaluation and management  Mollyann Halbert 11/11/2014, 5:06 AM

## 2014-11-11 NOTE — Consult Note (Signed)
WOC wound consult note Reason for Consult:pressure ulcers Wound type:Moisture (sacrum) plus pressure, venous insufficiency vs. Trauma (LE) Pressure Ulcer POA: Yes Measurement:Sacral, two lesions-both red, moist:  Right measures 1cm round x 0.2cm and left measures 0.8cm round x 0.2cm.  Right LE 4cm x 0.8cm with depth obscured by the presence of yellow fibrinous tissue Wound bed:As described above. Drainage (amount, consistency, odor) scant serous from sacral lesions, light yellow from RLE Periwound:intact, dry Dressing procedure/placement/frequency: I will provide bilateral Prevalon Boots for pressure redistribution and topical wound care with collagenase to the RLE.  The sacral areas are moisture related plus pressure, so a soft silicone foam will be sufficient.  Turning and repositioning is in place as is the timely provision of incontinence care. [place] nursing team will not follow, but will remain available to this patient, the nursing and medical teams.  Please re-consult if needed. Thanks, Maudie Flakes, MSN, RN, [place], [place], [place] [phone])

## 2014-11-11 NOTE — Progress Notes (Signed)
ANTIBIOTIC CONSULT NOTE  Pharmacy Consult for Vancomycin / Zosyn Indication: Sepsis  No Known Allergies  Patient Measurements: Height: 4\' 10"  (147.3 cm) Weight: 160 lb 11.5 oz (72.9 kg) IBW/kg (Calculated) : 40.9  Vital Signs: Temp: 97.8 F (36.6 C) (10/13 0800) Temp Source: Axillary (10/13 0800) BP: 109/37 mmHg (10/13 0400) Pulse Rate: 67 (10/13 0700) Intake/Output from previous day: 10/12 0701 - 10/13 0700 In: 4890.8 [I.V.:1865.8; Blood:335; IV [id number]:0177] Out: 150 [Urine:150]  Labs:  Recent Labs  11/04/2014 1432 11/11/14 0500  WBC 15.2* 21.3*  HGB 7.2* 9.5*  PLT 268 246  CREATININE 2.01* 1.72*   Estimated Creatinine Clearance: 25.1 mL/min (by C-G formula based on Cr of 1.72). No results for input(s): VANCOTROUGH, VANCOPEAK, VANCORANDOM, GENTTROUGH, GENTPEAK, GENTRANDOM, TOBRATROUGH, TOBRAPEAK, TOBRARND, AMIKACINPEAK, AMIKACINTROU, AMIKACIN in the last 72 hours.   Assessment: 65 yoF admitted from NH on 10/12 with hx CKD-IV, DM, DM, TIA, and CHF, bed-bound at baseline presents with AMS and foul-smelling urine. CXR (10/12) w/ RLL opacification concerning for pneumonia or atelectasis.  Pharmacy consulted to start Vancomycin and Zosyn for sepsis; possible UTI or PNA.  Anti-infectives 10/12 >> Vancomycin  >> 10/12 >> Zosyn  >>    Vitals/Labs Lactic acid 1.67, PCT 3.78 Temp:  Hypothermic (92.4) on admission, now improved. WBC elevated/increased (15 > 21), solucortef started 10/12. SCr improved to 1.72 (CKD-IV, SCr ~ 2 on admission) with CrCl ~ 25 mlmin  Cultures pending.  Goal of Therapy:  Vancomycin trough level 15-20 mcg/ml  Appropriate abx dosing, eradication of infection.   Plan:   Continue Zosyn 3.375g IV Q8H infused over 4hrs.   Increase to Vancomycin 750 mg IV q24h.  Measure Vanc trough at steady state.  Follow up renal fxn, culture results, and clinical course.  Gretta Arab PharmD, BCPS Pager [phone] 11/11/2014 8:50 AM

## 2014-11-11 NOTE — Progress Notes (Signed)
Paged by RN that patient was acutely declining and that family was at bedside awaiting update by CCM team. Patient has been seen by palliative care on prior admissions- and known to have complex family/psycho-social dynamics. I know her daughter [name] from my prior visit with them . This patient is not under guardianship but has been in the past.   I found the patient to be on full face mask, maxed out pressors and near death.  I explained to her daughter and her daughters friend that [name] is dying - that she will likely not survive this hospitalization. Her daughter had a dramatic grief response. Appreciate chaplain helping with her grief. The daughter is struggling to make decisions and there is underlying mistrust for healthcare providers and low health literacy- poor insight into this decline-there has been upstream preparation for this on many occasions.  Summary of Goals:  1. DNR/DNI 2. Continue all current interventions without additional escalation 3. Provide low dose morphine for comfort PRN 4. Support with O2  This is a step-wise conversation and progression-will need to address further de-escalation if she survives the next 24 hours.  Lane Hacker, DO Palliative Medicine [phone]

## 2014-11-11 NOTE — Progress Notes (Signed)
Pt only made 39ml of UOP for this shift. Notified Dr Melvyn Novas. No new orders.

## 2014-11-11 NOTE — Progress Notes (Signed)
PULMONARY / CRITICAL CARE MEDICINE   Name: [name] MRN: [id number] DOB: 19-Jul-1940    ADMISSION DATE:  11/26/2014 CONSULTATION DATE:  11/27/2014  REFERRING MD :  Tyrell Antonio  CHIEF COMPLAINT:  AMS  INITIAL PRESENTATION: 74 year old female presented [facility] ED 10/12 from [facility] with altered mental status x 24 hours.She was admitted to the hospitalist team for presumed sepsis secondary to UTI vs PNA. Upon arrival to SDU she was hypotensive despite IVF resuscitation. PCCM to see.   STUDIES:  10/12 CXR > RLL opacification concerning for PNA vs ATX 10/13 2 d >> 10/13 CT head  SIGNIFICANT EVENTS: 10/12 admitted >   HISTORY OF PRESENT ILLNESS:  74 year old female with PMH as below, which includes blindness, HTN, DM, CVA, stage IV CKD, chronic osteomyelitis, diastolic CHF, and failure to thrive. She was most recently admitted 04/2014 with proteus UTI treated with rocephin and discharged to SNF. Palliative care consult was recommended at discharge. Her baseline level of function appears to be pretty poor, based on SNF MD notes. Most recently 10/19/2014 MD reports that she has had significant weight loss and is unable to engage with those around her. She was unable to participate in HPI or ROS during that encounter.  Also describes as O2 dependent for diastolic CHF. She has chronic foley as well.   10/12 she presented to [facility] ED from SNF. Staff there voices concern of altered mental status. Staff stated that she is typically verbal, but today 10/12 was nonverbal. Urine was noted to be malodorous in ED. WBC was elevated at 15. UA demonstrated acute infection. CXR could also not rule out a pneumonia as there was some RLL consolidation noted. She was admitted to the hospitalist team and treated with  IV antibiotics. Upon arrival to ICU she was hypotensive. This did not resolve with repeat IVF boluses. PCCM consulted.   SUBJECTIVE:  Pt will not interact, minimal response even with sternal  rub Pressors running  VITAL SIGNS: Temp:  [92.4 F (33.6 C)-98.3 F (36.8 C)] 98.3 F (36.8 C) (10/13 1000) Pulse Rate:  [46-99] 99 (10/13 0945) Resp:  [11-29] 20 (10/13 0945) BP: (58-132)/(20-78) 109/37 mmHg (10/13 0400) SpO2:  [80 %-100 %] 97 % (10/13 0945) Weight:  [158 lb (71.668 kg)-160 lb 11.5 oz (72.9 kg)] 160 lb 11.5 oz (72.9 kg) (10/12 1845) HEMODYNAMICS: CVP:  [0 mmHg] 0 mmHg VENTILATOR SETTINGS:   INTAKE / OUTPUT:  Intake/Output Summary (Last 24 hours) at 11/11/14 1010 Last data filed at 11/11/14 0800  Gross per 24 hour  Intake 5103.27 ml  Output    150 ml  Net 4953.27 ml    PHYSICAL EXAMINATION: General:  Overweight female poorly responsive Neuro:  Comatose. [place] 4, wd rt side to pain HEENT:  [place]/AT, disconjugate gaze Cardiovascular:  RRR, no MRG Lungs:  Bilateral rhonchi Abdomen:  Soft, non-distended Musculoskeletal:  R foot with all toes ambutated Skin: Dressed wound to R shin  LABS:  CBC  Recent Labs Lab 11/24/2014 1432 11/11/14 0500  WBC 15.2* 21.3*  HGB 7.2* 9.5*  HCT 22.1* 27.2*  PLT 268 246   Coag's  Recent Labs Lab 11/27/2014 2155  APTT 55*  INR 2.34*   BMET  Recent Labs Lab 11/07/2014 1432 11/11/14 0500  NA 138 137  K 3.7 2.8*  CL 106 111  CO2 23 17*  BUN 28* 25*  CREATININE 2.01* 1.72*  GLUCOSE 44* 243*   Electrolytes  Recent Labs Lab 11/14/2014 1432 11/11/14 0500  CALCIUM 7.6* 6.8*   Sepsis Markers  Recent Labs Lab 11/09/2014 1446 11/18/2014 2155  LATICACIDVEN 1.67  --   PROCALCITON  --  3.78   ABG No results for input(s): PHART, PCO2ART, PO2ART in the last 168 hours. Liver Enzymes  Recent Labs Lab 11/23/2014 1432 11/11/14 0500  AST 40 43*  ALT 24 26  ALKPHOS 230* 212*  BILITOT 0.8 1.1  ALBUMIN 1.2* 1.2*   Cardiac Enzymes No results for input(s): TROPONINI, PROBNP in the last 168 hours. Glucose  Recent Labs Lab 11/15/2014 1613 11/28/2014 1847 11/01/2014 1938 11/17/2014 2343 11/11/14 0403 11/11/14 0724   GLUCAP 125* 115* 107* 138* 142* 215*    Imaging Dg Chest Port 1 View  11/04/2014  CLINICAL DATA:  Central line placement. EXAM: PORTABLE CHEST 1 VIEW COMPARISON:  11/15/2014 FINDINGS: Interval placement of right central venous catheter with tip over the cavoatrial junction. No pneumothorax. Shallow inspiration. Patchy opacities in the lung bases may represent pneumonia or atelectasis. No blunting of costophrenic angles. Mediastinal contours appear intact. Normal heart size and pulmonary vascularity. Surgical clips in the right upper quadrant. Old left rib fractures. Degenerative changes in the shoulders and spine. IMPRESSION: Right central venous catheter with tip over the cavoatrial junction. No pneumothorax. Patchy airspace disease in the lung bases. Electronically Signed   By: Lucienne Capers M.D.   On: 10/30/2014 22:24   Dg Chest Port 1 View  11/29/2014  CLINICAL DATA:  Sepsis. EXAM: PORTABLE CHEST 1 VIEW COMPARISON:  May 20, 2014. FINDINGS: Stable cardiomegaly. No pneumothorax or pleural effusion is noted. Right basilar airspace opacity is noted concerning for possible pneumonia or atelectasis. Minimal scarring or subsegmental atelectasis is noted laterally in left lung base. Bony thorax is intact. IMPRESSION: New right basilar opacity is noted concerning for pneumonia or subsegmental atelectasis. Minimal left midlung subsegmental atelectasis or scarring is noted. Electronically Signed   By: Marijo Conception, M.D.   On: 11/09/2014 15:20     ASSESSMENT / PLAN:  PULMONARY A: Chronic hypoxemic respiratory failure P:   O2 dependent baseline  CARDIOVASCULAR A:  Septic shock secondary to UTI/ PNA Chronic diastolic CHF H/o HTN P:  Telemetry monitoring MAP goal > 62mm/Hg, norepi running  Aggressive IVF resuscitation  Follow CVP Will check TTE, note hx diastolic CHF  RENAL Lab Results  Component Value Date   CREATININE 1.72* 11/11/2014   CREATININE 2.01* 11/05/2014   CREATININE  1.0 09/09/2014   CREATININE 1.71* 05/25/2014   CREATININE 1.5* 01/11/2014   A:   Acute on stage IV CKD Pseudohypocalcemia P:  Hydrate and support w pressors.  Follow Bmet Replace electrolytes as indicated.   GASTROINTESTINAL A:   Protein calorie malnutrition P:   NPO Consider TPN vs tube feeds soon   HEMATOLOGIC A:  Anemia of chronic illness (baseline Hgb around 9) P:  Follow CBC Transfuse for hgb/hct less than 7/21 Enoxaparin for VTE ppx.    INFECTIOUS A:   Septic shock secondary to UTI/HCAP Recent history of proteus UTI P:   BCx2 10/12 >>> UC 10/12 >>> Abx: Zosyn, start date 10/12 >>> Abx: Vacno, start date 10/12 >>> Trend WBC and fever curves  ENDOCRINE CBG (last 3)   Recent Labs  11/28/2014 2343 11/11/14 0403 11/11/14 0724  GLUCAP 138* 142* 215*   A:   DM Hypoglycemic Doubt  AI with cortisol >100 P:   CBG monitoring. SSI Cortisol >100 D/c Stress dose steroids  NEUROLOGIC A:  Acute on chronic metabolic encephalopathy H/o CVA Blind P:  RASS goal: 0 10/13 CT head to rule out new stroke or injury  GLOBAL: Per notes patient legal guardian is now her daughter. She has had an appointed  guardian at times in the past. We need to clarify code status with her. Will attempt to speak with her daughter 10/13  FAMILY  - Updates: no family at bedside. Will attempt to call.   - Inter-disciplinary family meet or Palliative Care meeting due by:  10/19  Richardson Landry Minor ACNP Maryanna Shape PCCM Pager [phone] till 3 pm If no answer page [phone] 11/11/2014, 10:12 AM   Attending Note:  I have examined patient, reviewed labs, studies and notes. I have discussed the case with S Minor, and I agree with the data and plans as amended above. Septic shock and metabolic encephalopathy likely due to UTI +/- HCAP. On my evaluation she is on norepi for BP support, is completely unresponsive to sternal rub. Will continue broad abx, current supportive care but I believe we  will need to set limitations here. She is a poor candidate for intubation / mechanical ventilation given unlikely ability to be liberated. Will check head Ct to assess for any new acute neuro event. Will attempt to discuss status with her daughter. Will also involve palliative care service for assistance with GOC. Independent critical care time is 45 minutes.   Baltazar Apo, MD, PhD 11/11/2014, 11:13 AM [facility] [phone] or if no answer [phone]

## 2014-11-11 NOTE — Plan of Care (Signed)
Problem: Phase I Progression Outcomes Goal: Voiding-avoid urinary catheter unless indicated Outcome: Not Applicable Date Met:  56/15/48 Chronic catheter use

## 2014-11-11 NOTE — Progress Notes (Addendum)
Initial Nutrition Assessment  DOCUMENTATION CODES:   Obesity unspecified  INTERVENTION:  - Will monitor for GOC, need for nutrition support - RD will continue to monitor for needs related to [place]:   Inadequate oral intake related to inability to eat as evidenced by NPO status.  GOAL:   Patient will meet greater than or equal to 90% of their needs  MONITOR:   Weight trends, Labs, Skin, I & O's  REASON FOR ASSESSMENT:   Malnutrition Screening Tool  ASSESSMENT:   74 year old female presented [facility] ED 10/12 from [facility] SNF with altered mental status x 24 hours.She was admitted to the hospitalist team for presumed sepsis secondary to UTI vs PNA. Upon arrival to SDU she was hypotensive despite IVF resuscitation. PCCM to see.   Pt seen for MST. BMI indicates obesity. Pt unresponsive and RN and another member of care team in assessing pt at time of RD visit. No family or visitors present at this time. Unable to complete physical assessment. Per chart review, pt has lost 23 lbs (12.5% body weight) in the past 6 months which is significant for time frame.   Pt unable to meet needs at this time. Will monitor for GOC and if nutrition support is needed/warranted. Medications reviewed. Labs reviewed; CBGs: 107-215 mg/dL, K: 2.8 mmol/L, BUN/creatinine elevated, Ca: 6.8 mg/dL, GFR: 33.  Drip: Levophed @ 35 mcg/min.  Diet Order:  Diet NPO time specified  Skin:   Sage 2 L buttocks pressure ulcer  Last BM:  PTA  Height:   Ht Readings from Last 1 Encounters:  11/28/2014 4\' 10"  (1.473 m)    Weight:   Wt Readings from Last 1 Encounters:  11/08/2014 160 lb 11.5 oz (72.9 kg)    Ideal Body Weight:  42.73 kg (kg)  BMI:  Body mass index is 33.6 kg/(m^2).  Estimated Nutritional Needs:   Kcal:  1100-1300  Protein:  55-65 grams  Fluid:  1.7-2 L/day  EDUCATION NEEDS:   No education needs identified at this time     Jarome Matin, RD, LDN Inpatient  Clinical Dietitian Pager # [phone] After hours/weekend pager # [phone]

## 2014-11-11 NOTE — Progress Notes (Signed)
CRITICAL VALUE ALERT  Critical value received:  Gram positive cocci in aerobic bottle  Date of notification:  11/11/14  Time of notification:  3716  Critical value read back:Yes.    Nurse who received alert:  Nelva Bush, RN   MD notified (1st page):  Gaylyn Lambert, NP PCCM  Time of first page:  71- verbally reported  MD notified (2nd page):  Time of second page:  Responding MD:  Gaylyn Lambert, NP, PCCM  Time MD responded:  9678- verbal acknowledge

## 2014-11-12 ENCOUNTER — Inpatient Hospital Stay (HOSPITAL_COMMUNITY): Payer: Medicare Other

## 2014-11-12 LAB — URINE CULTURE

## 2014-11-12 LAB — GLUCOSE, CAPILLARY: Glucose-Capillary: 213 mg/dL — ABNORMAL HIGH (ref 65–99)

## 2014-11-13 LAB — CULTURE, BLOOD (ROUTINE X 2)

## 2014-11-16 LAB — CULTURE, BLOOD (ROUTINE X 2): Culture: NO GROWTH

## 2014-11-19 ENCOUNTER — Telehealth: Payer: Self-pay

## 2014-11-19 NOTE — Telephone Encounter (Signed)
On 11/19/2014 I received a death certificate from [place]. The death certificate is for entombment. The patient is a patient of Doctor Byrum. The death certificate will be taken to [facility]) for signature Monday am.On 11/29/2014 I received the death certificate back from Doctor Byrum. I got the death certificate ready for pickup and I called the funeral home to let them know the death certificate is ready for pickup

## 2014-11-30 NOTE — Discharge Summary (Signed)
PULMONARY / CRITICAL CARE MEDICINE   Name: Camika Marsico MRN: [id number] DOB: 1940/07/15    ADMISSION DATE:  12/06/2014 CONSULTATION DATE:  06-Dec-2014 DATE OF DEATH: 08-Dec-2014  REFERRING MD :  Tyrell Antonio  FINAL CAUSE OF DEATH:  Septic Shock  SECONDARY CAUSES OF DEATH:  Urinary tract infection related to chronic indwelling foley catheter Acute metabolic encephalopathy Obtundation Possible RLL HCAP vs atelectasis Chronic progressive debilitation and failure to thrive  History of HTN Hypokalemia  Chronic diastolic CHF Chronic hypoxemic respiratory failure Acute on chronic renal failure Acute metabolic acidosis Acute lactic acidosis Protein calorie malnutrition Anemia of chronic disease Diabetes mellitus History of CVA History of blindness History of chronic osteomyelitis    HOSPITAL COURSE: 74 year old female with a history of blindness, HTN, DM, CVA, stage IV CKD, chronic osteomyelitis, diastolic CHF, chronic foley catheter brought to [facility] ED 12/06/22 from [facility] SNF with progressively suppressed mental status x 24 hours.  She was admitted to the hospitalist team for sepsis secondary to UTI vs PNA. Upon arrival to SDU she was hypotensive despite IVF resuscitation.  A CVC was placed and norepinephrine was initiated and more volume was given. She was treated with broad spectrum antibiotics and electrolytes were corrected. Metabolic panel revealed a progressive renal failure, lactic acidosis.  Despite all aggressive interventions as described her MS worsened and her hemodynamics did not improve. Head CT showed no acute changes. Given her overall medical history, poor prognosis and progressive debilitation it was felt that she would likely not benefit from of survive more aggressive interventions such as CPR or mechanical ventilation. Palliative Care consultation was sought and they were able to discuss goals of care with the patient's daughter. Based on these discussions her Code  Status was confirmed to be DNR/I. All current interventions were continued but she was also started on morphine. She did not improve and expired on 12-08-14 am.   STUDIES:  12-06-2022 CXR > RLL opacification concerning for PNA vs ATX 10/13 2 d >> 10/13 CT head  HISTORY OF PRESENT ILLNESS:  74 year old female with PMH as below, which includes blindness, HTN, DM, CVA, stage IV CKD, chronic osteomyelitis, diastolic CHF, and failure to thrive. She was most recently admitted 04/2014 with proteus UTI treated with rocephin and discharged to SNF. Palliative care consult was recommended at discharge. Her baseline level of function appears to be pretty poor, based on SNF MD notes. Most recently 10/19/2014 MD reports that she has had significant weight loss and is unable to engage with those around her. She was unable to participate in HPI or ROS during that encounter.  Also describes as O2 dependent for diastolic CHF. She has chronic foley as well.   2022/12/06 she presented to [facility] ED from SNF. Staff there voices concern of altered mental status. Staff stated that she is typically verbal, but today 2022/12/06 was nonverbal. Urine was noted to be malodorous in ED. WBC was elevated at 15. UA demonstrated acute infection. CXR could also not rule out a pneumonia as there was some RLL consolidation noted. She was admitted to the hospitalist team and treated with  IV antibiotics. Upon arrival to ICU she was hypotensive. This did not resolve with repeat IVF boluses. PCCM consulted.     Baltazar Apo, MD, PhD 11/18/2014, 11:41 PM [facility] [phone] or if no answer [phone]

## 2014-11-30 NOTE — Progress Notes (Signed)
Date: 12-07-14 Chart reviewed for concurrent status and case management needs. Will continue to follow patient for changes and needs: Patient expired this am. Velva Harman, RN, BSN, CCM   [phone]

## 2014-11-30 NOTE — Progress Notes (Signed)
   2014/12/05 0900  Clinical Encounter Type  Visited With Family  Visit Type Follow-up;Death  Referral From Nurse;Chaplain  Spiritual Encounters  Spiritual Needs Grief support  Stress Factors  Family Stress Factors Loss    Chaplain Jerene Pitch and Counseling Intern Curt Bears Beam present at bedside.  Provided brief grief support with family.  Will continue to follow for support at bedside and on unit as needs arise.

## 2014-11-30 NOTE — Progress Notes (Signed)
Pt deceased noted time of death is 84, confirmed at bedside with another RN. Family at bedside, with [place]. Support provided. Notified Richardson Landry Minor of CCM.

## 2014-11-30 NOTE — Progress Notes (Signed)
Chaplain was with family when Ms [name] died. Deep grief gripped the family. Night Chaplain transitioned spiritual care to daytime chaplains.  Sallee Lange. Tadashi Burkel, [place]

## 2014-11-30 NOTE — Consult Note (Addendum)
Consultation Note Date: Nov 27, 2014   Patient Name: [name]  DOB: Jan 18, 1941  MRN: [id number]  Age / Sex: 74 y.o., female   PCP: Gildardo Cranker, DO Referring Physician: Collene Gobble, MD  Reason for Consultation: Terminal care  Palliative Care Assessment and Plan Summary of Established Goals of Care and Medical Treatment Preferences   Clinical Assessment/Narrative: 74 yo woman with dementia, failure to thrive with 30 lb weight loss in past 3 months admitted with sepsis from SNF. Currently on maxed out pressors. Her albumin is 1.6, lactic acid is >5, CXR shows pulmonary edema.   Paged by RN that patient was acutely declining and that family was at bedside awaiting update by CCM team. Patient has been seen by palliative care on prior admissions- and known to have complex family/psycho-social dynamics. I know her daughter [name] from my prior visit with them . This patient is not under guardianship but has been in the past.   I found the patient to be on full face mask, maxed out pressors and near death.  I explained to her daughter and her daughters friend that [name] is dying - that she will likely not survive this hospitalization. Her daughter had a dramatic grief response. Appreciate chaplain helping with her grief. The daughter is struggling to make decisions and there is underlying mistrust for healthcare providers and low health literacy- poor insight into this decline-there has been upstream preparation for this on many occasions.  Summary of Goals:  1. DNR/DNI 2. Continue all current interventions without additional escalation 3. Provide low dose morphine for comfort PRN 4. Support with O2  This is a step-wise conversation and progression-will need to address further de-escalation if she survives the next 24 hours.        Contacts/Participants in Discussion: Primary Decision Maker: [name], daughter   HCPOA: yes  Prior guardianship, daughter has high levels of  guilt  Code Status/Advance Care Planning:  DNR, I discussed with daughter that if her mothers heart stops an attempt at resucitation would not be successful and would lead to more pain and suffering-I made a strong medical recommendation towards DNR. She could not tell me what she thought her mother would wanmt in these circumstances.  Symptom Management:   Low dose morphine for comfort   Palliative Prophylaxis: bowel regimen, haldol for agitation  Additional Recommendations (Limitations, Scope, Preferences):  As above Psycho-social/Spiritual:   Support System: Daughter at bedside but she is emotionally fragile-extreme grief at bedside.  Desire for further Chaplaincy support:yes  Prognosis: Hours - Days  Discharge Planning:  Based on her current condition she may have a hospital death       Chief Complaint/History of Present Illness: unresponsive  Primary Diagnoses  Present on Admission:  . Sepsis ([place]) . Type 2 diabetes, controlled, with peripheral circulatory disorder ([place]) . Blindness . UTI (lower urinary tract infection) . HCAP (healthcare-associated pneumonia) . Chronic kidney disease (CKD), stage IV (severe) ([place]) . FTT (failure to thrive) in adult  Palliative Review of Systems: Unable to obtain I have reviewed the medical record, interviewed the patient and family, and examined the patient. The following aspects are pertinent.  Past Medical History  Diagnosis Date  . Hypertension   . Diabetes mellitus   . Blindness   . TIA (transient ischemic attack)   . Anemia   . GERD (gastroesophageal reflux disease)   . Constipation   . Renal disorder 05/2012    acute Renal Failure  . SYNCOPE 05/19/2009  Annotation: multiple over past 1 yr  Qualifier: Diagnosis of  By: [name] MD, [place]    . CHOLECYSTECTOMY, HX OF 05/19/2009    Qualifier: Diagnosis of  By: [name] MD, [name]    . 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 Chronic osteomyelitis of toe of right foot ([place]) 09/19/2013     3rd, 4th, 5th   . Acute pyelonephritis 05/25/2014  . Oral thrush 05/23/2014  . Chronic kidney disease (CKD), stage IV (severe) ([place]) 05/21/2014  . HCAP (healthcare-associated pneumonia) 05/20/2014  . Chronic diastolic CHF (congestive heart failure) ([place]) 03/02/2014   Social History   Social History  . Marital Status: Divorced    Spouse Name: N/A  . Number of Children: N/A  . Years of Education: N/A   Social History Main Topics  . Smoking status: Former Smoker -- 0 years    Quit date: 06/25/1999  . Smokeless tobacco: Current User    Types: Snuff  . Alcohol Use: No  . Drug Use: No  . Sexual Activity: Not Asked   Other Topics Concern  . None   Social History Narrative   Family History  Problem Relation Age of Onset  . Heart failure Mother    Scheduled Meds: . sodium chloride   Intravenous Once  . antiseptic oral rinse  7 mL Mouth Rinse q12n4p  . chlorhexidine  15 mL Mouth Rinse BID  . collagenase   Topical Daily  . enoxaparin (LOVENOX) injection  30 mg Subcutaneous Q24H  . piperacillin-tazobactam (ZOSYN)  IV  3.375 g Intravenous 3 times per day  . timolol  1 drop Left Eye BID  . vancomycin  750 mg Intravenous Q24H   Continuous Infusions: . dextrose 5 % and 0.9% NaCl 100 mL/hr at 11/11/14 2104  . norepinephrine (LEVOPHED) Adult infusion 30 mcg/min (2014/11/20 0400)  . vasopressin (PITRESSIN) infusion - *FOR SHOCK* 0.03 Units/min (20-Nov-2014 0400)   PRN Meds:.acetaminophen **OR** acetaminophen, albuterol, glycopyrrolate, ipratropium-albuterol, morphine injection, ondansetron **OR** ondansetron (ZOFRAN) IV, sodium chloride, sodium chloride Medications Prior to Admission:  Prior to Admission medications   Medication Sig Start Date End Date Taking? Authorizing Provider  acetaminophen (TYLENOL) 325 MG tablet Take 650 mg by mouth 3 (three) times daily.    Yes Historical Provider, MD  Amino Acids-Protein Hydrolys (PRO-STAT) LIQD Take 60 mLs by mouth 3 (three) times daily.    Yes  Historical Provider, MD  aspirin EC 325 MG tablet Take 325 mg by mouth daily. For heart failure   Yes Historical Provider, MD  Brinzolamide-Brimonidine [facility]) 1-0.2 % SUSP Place 1 drop into the left eye 2 (two) times daily.   Yes Historical Provider, MD  calcium carbonate (OS-CAL) 1250 (500 CA) MG chewable tablet Chew 1 tablet by mouth 3 (three) times daily.   Yes Historical Provider, MD  carvedilol (COREG) 3.125 MG tablet Take 3.125 mg by mouth 2 (two) times daily with a meal.   Yes Historical Provider, MD  docusate sodium (COLACE) 100 MG capsule Take 100 mg by mouth 2 (two) times daily.   Yes Historical Provider, MD  hydrocortisone (ANUSOL-HC) 2.5 % rectal cream Place 1 application rectally 2 (two) times daily as needed for hemorrhoids.    Yes Historical Provider, MD  ipratropium-albuterol (DUONEB) 0.5-2.5 (3) MG/3ML SOLN Take 3 mLs by nebulization every 6 (six) hours as needed (wheezing).   Yes Historical Provider, MD  metoCLOPramide (REGLAN) 5 MG tablet Take 1 tablet (5 mg total) by mouth 4 (four) times daily -  before meals and at bedtime. 12/22/13  Yes [name], MD  Morphine Sulfate (MORPHINE CONCENTRATE) 10 MG/0.5ML SOLN concentrated solution Take 0.5 mLs (10 mg total) by mouth every 2 (two) hours as needed for severe pain. 05/25/14  Yes Venetia Maxon Rama, MD  Multiple Vitamins-Minerals (DECUBI-VITE) CAPS Take 2 capsules by mouth daily.   Yes Historical Provider, MD  NON FORMULARY Take 240 mLs by mouth 3 (three) times daily. 2-cal supplement 240 cc three times daily   Yes Historical Provider, MD  OXYGEN Inhale 2 L into the lungs continuous.   Yes Historical Provider, MD  polyethylene glycol (MIRALAX / GLYCOLAX) packet Take 17 g by mouth daily. 06/18/12  Yes Linton Flemings, MD  promethazine (PHENERGAN) 25 MG tablet Take 25 mg by mouth every 6 (six) hours as needed for nausea or vomiting.   Yes Historical Provider, MD  sertraline (ZOLOFT) 20 MG/ML concentrated solution Take 1.3 mLs (26 mg  total) by mouth daily. Patient taking differently: Take 100 mg by mouth daily.  05/25/14  Yes Venetia Maxon Rama, MD  sodium chloride (OCEAN) 0.65 % SOLN nasal spray Place 1 spray into both nostrils every 6 (six) hours as needed for congestion.   Yes Historical Provider, MD  timolol (TIMOPTIC) 0.5 % ophthalmic solution Place 1 drop into the left eye 2 (two) times daily.   Yes Historical Provider, MD   No Known Allergies CBC:    Component Value Date/Time   WBC 21.3* 11/11/2014 0500   WBC 13.9 09/09/2014   HGB 9.5* 11/11/2014 0500   HCT 27.2* 11/11/2014 0500   PLT 246 11/11/2014 0500   MCV 81.2 11/11/2014 0500   NEUTROABS 13.4* 11/02/2014 1432   NEUTROABS 12 09/09/2014   LYMPHSABS 1.2 10/30/2014 1432   MONOABS 0.6 11/17/2014 1432   EOSABS 0.0 11/01/2014 1432   BASOSABS 0.0 10/31/2014 1432   Comprehensive Metabolic Panel:    Component Value Date/Time   NA 136 11/11/2014 1730   NA 140 09/09/2014   K 3.7 11/11/2014 1730   CL 112* 11/11/2014 1730   CO2 12* 11/11/2014 1730   BUN 23* 11/11/2014 1730   BUN 19 09/09/2014   CREATININE 1.78* 11/11/2014 1730   CREATININE 1.0 09/09/2014   GLUCOSE 310* 11/11/2014 1730   CALCIUM 6.6* 11/11/2014 1730   AST 43* 11/11/2014 0500   ALT 26 11/11/2014 0500   ALKPHOS 212* 11/11/2014 0500   BILITOT 1.1 11/11/2014 0500   PROT 5.1* 11/11/2014 0500   ALBUMIN 1.2* 11/11/2014 0500    Physical Exam: Vital Signs: BP 109/37 mmHg  Pulse 81  Temp(Src) 97.7 F (36.5 C) (Core (Comment))  Resp 19  Ht 4\' 10"  (1.473 m)  Wt 72.9 kg (160 lb 11.5 oz)  BMI 33.60 kg/m2  SpO2 89% SpO2: SpO2: (!) 89 % O2 Device: O2 Device: Venturi Mask O2 Flow Rate: O2 Flow Rate (L/min): 6 L/min Intake/output summary:  Intake/Output Summary (Last 24 hours) at 2014/11/19 0818 Last data filed at 19-Nov-2014 0400  Gross per 24 hour  Intake   4853 ml  Output     60 ml  Net   4793 ml   LBM:   Baseline Weight: Weight: 72.576 kg (160 lb) Most recent weight: Weight: 72.9 kg (160  lb 11.5 oz)  Exam Findings:  Pale, unresponsive, on NRB, no grimace no agitation, shallow breathing, periods of apnea noted         Palliative Performance Scale: 10              Additional Data Reviewed: Recent Labs  11/13/2014  1432  11/11/14  0500  11/11/14  1730  WBC  15.2*  21.3*   --   HGB  7.2*  9.5*   --   PLT  268  246   --   NA  138  137  136  BUN  28*  25*  23*  CREATININE  2.01*  1.72*  1.78*     Time In: 4PM Time Out: 5PM Time Total: 60 minutes Greater than 50%  of this time was spent counseling and coordinating care related to the above assessment and plan.  Signed by: Roma Schanz, DO  11/24/14, 8:18 AM  Please contact Palliative Medicine Team phone at [phone] for questions and concerns.

## 2014-11-30 DEATH — deceased
